# Patient Record
Sex: Male | Born: 1935 | Race: White | Hispanic: No | Marital: Married | Smoking: Former smoker
Health system: Southern US, Community
[De-identification: ages and names within clinical notes are randomized; demographics above are authoritative.]

## PROBLEM LIST (undated history)

## (undated) ENCOUNTER — Emergency Department (HOSPITAL_COMMUNITY): Disposition: A | Discharge status: Home / Self Care | Payer: Medicare Other

## (undated) DIAGNOSIS — G459 Transient cerebral ischemic attack, unspecified: Secondary | ICD-10-CM

## (undated) DIAGNOSIS — T4145XA Adverse effect of unspecified anesthetic, initial encounter: Secondary | ICD-10-CM

## (undated) DIAGNOSIS — I6529 Occlusion and stenosis of unspecified carotid artery: Secondary | ICD-10-CM

## (undated) DIAGNOSIS — E079 Disorder of thyroid, unspecified: Secondary | ICD-10-CM

## (undated) DIAGNOSIS — F419 Anxiety disorder, unspecified: Secondary | ICD-10-CM

## (undated) DIAGNOSIS — M1990 Unspecified osteoarthritis, unspecified site: Secondary | ICD-10-CM

## (undated) DIAGNOSIS — I209 Angina pectoris, unspecified: Secondary | ICD-10-CM

## (undated) DIAGNOSIS — Z8719 Personal history of other diseases of the digestive system: Secondary | ICD-10-CM

## (undated) DIAGNOSIS — J45909 Unspecified asthma, uncomplicated: Secondary | ICD-10-CM

## (undated) DIAGNOSIS — I10 Essential (primary) hypertension: Secondary | ICD-10-CM

## (undated) DIAGNOSIS — Z974 Presence of external hearing-aid: Secondary | ICD-10-CM

## (undated) DIAGNOSIS — R0602 Shortness of breath: Secondary | ICD-10-CM

## (undated) DIAGNOSIS — K219 Gastro-esophageal reflux disease without esophagitis: Secondary | ICD-10-CM

## (undated) DIAGNOSIS — N200 Calculus of kidney: Secondary | ICD-10-CM

## (undated) DIAGNOSIS — T8859XA Other complications of anesthesia, initial encounter: Secondary | ICD-10-CM

## (undated) DIAGNOSIS — E785 Hyperlipidemia, unspecified: Secondary | ICD-10-CM

## (undated) DIAGNOSIS — I2510 Atherosclerotic heart disease of native coronary artery without angina pectoris: Secondary | ICD-10-CM

## (undated) DIAGNOSIS — Z8489 Family history of other specified conditions: Secondary | ICD-10-CM

## (undated) DIAGNOSIS — K5792 Diverticulitis of intestine, part unspecified, without perforation or abscess without bleeding: Secondary | ICD-10-CM

## (undated) DIAGNOSIS — C61 Malignant neoplasm of prostate: Secondary | ICD-10-CM

## (undated) HISTORY — DX: Malignant neoplasm of prostate: C61

## (undated) HISTORY — DX: Diverticulitis of intestine, part unspecified, without perforation or abscess without bleeding: K57.92

## (undated) HISTORY — DX: Calculus of kidney: N20.0

## (undated) HISTORY — PX: CAROTID ENDARTERECTOMY: SUR193

## (undated) HISTORY — DX: Presence of external hearing-aid: Z97.4

## (undated) HISTORY — DX: Occlusion and stenosis of unspecified carotid artery: I65.29

## (undated) HISTORY — PX: CHOLECYSTECTOMY: SHX55

---

## 1996-07-13 HISTORY — PX: PROSTATECTOMY: SHX69

## 1999-04-29 ENCOUNTER — Ambulatory Visit: Admission: RE | Admit: 1999-04-29 | Discharge: 1999-04-29 | Payer: Self-pay | Admitting: Pulmonary Disease

## 1999-09-08 ENCOUNTER — Inpatient Hospital Stay (HOSPITAL_COMMUNITY): Admission: EM | Admit: 1999-09-08 | Discharge: 1999-09-09 | Payer: Self-pay | Admitting: Emergency Medicine

## 1999-09-08 ENCOUNTER — Encounter: Payer: Self-pay | Admitting: Internal Medicine

## 2002-08-23 ENCOUNTER — Encounter: Admission: RE | Admit: 2002-08-23 | Discharge: 2002-08-23 | Payer: Self-pay | Admitting: Sports Medicine

## 2002-09-12 ENCOUNTER — Encounter: Admission: RE | Admit: 2002-09-12 | Discharge: 2002-09-12 | Payer: Self-pay | Admitting: Sports Medicine

## 2002-09-20 ENCOUNTER — Encounter: Admission: RE | Admit: 2002-09-20 | Discharge: 2002-09-20 | Payer: Self-pay | Admitting: Family Medicine

## 2002-10-11 ENCOUNTER — Ambulatory Visit (HOSPITAL_COMMUNITY): Admission: RE | Admit: 2002-10-11 | Discharge: 2002-10-11 | Payer: Self-pay | Admitting: Family Medicine

## 2002-10-11 ENCOUNTER — Encounter: Admission: RE | Admit: 2002-10-11 | Discharge: 2002-10-11 | Payer: Self-pay | Admitting: Sports Medicine

## 2002-11-01 ENCOUNTER — Encounter: Admission: RE | Admit: 2002-11-01 | Discharge: 2002-11-01 | Payer: Self-pay | Admitting: Sports Medicine

## 2002-11-03 ENCOUNTER — Encounter: Admission: RE | Admit: 2002-11-03 | Discharge: 2002-11-03 | Payer: Self-pay | Admitting: Sports Medicine

## 2002-11-09 ENCOUNTER — Encounter: Admission: RE | Admit: 2002-11-09 | Discharge: 2002-11-09 | Payer: Self-pay | Admitting: Sports Medicine

## 2002-12-06 ENCOUNTER — Encounter (INDEPENDENT_AMBULATORY_CARE_PROVIDER_SITE_OTHER): Payer: Self-pay | Admitting: Specialist

## 2002-12-06 ENCOUNTER — Ambulatory Visit (HOSPITAL_COMMUNITY): Admission: RE | Admit: 2002-12-06 | Discharge: 2002-12-06 | Payer: Self-pay | Admitting: Gastroenterology

## 2002-12-12 ENCOUNTER — Encounter: Admission: RE | Admit: 2002-12-12 | Discharge: 2002-12-12 | Payer: Self-pay | Admitting: Sports Medicine

## 2003-03-14 ENCOUNTER — Encounter: Admission: RE | Admit: 2003-03-14 | Discharge: 2003-03-14 | Payer: Self-pay | Admitting: Family Medicine

## 2003-03-20 ENCOUNTER — Encounter: Admission: RE | Admit: 2003-03-20 | Discharge: 2003-03-20 | Payer: Self-pay | Admitting: Sports Medicine

## 2003-04-29 ENCOUNTER — Encounter: Payer: Self-pay | Admitting: Family Medicine

## 2003-04-29 ENCOUNTER — Ambulatory Visit (HOSPITAL_COMMUNITY): Admission: RE | Admit: 2003-04-29 | Discharge: 2003-04-29 | Payer: Self-pay | Admitting: Family Medicine

## 2003-05-01 ENCOUNTER — Encounter: Admission: RE | Admit: 2003-05-01 | Discharge: 2003-05-01 | Payer: Self-pay | Admitting: Sports Medicine

## 2003-06-13 ENCOUNTER — Encounter: Admission: RE | Admit: 2003-06-13 | Discharge: 2003-06-13 | Payer: Self-pay | Admitting: Sports Medicine

## 2003-06-29 ENCOUNTER — Encounter: Admission: RE | Admit: 2003-06-29 | Discharge: 2003-06-29 | Payer: Self-pay | Admitting: Family Medicine

## 2003-07-17 ENCOUNTER — Encounter: Admission: RE | Admit: 2003-07-17 | Discharge: 2003-07-17 | Payer: Self-pay | Admitting: Family Medicine

## 2003-07-19 ENCOUNTER — Encounter: Admission: RE | Admit: 2003-07-19 | Discharge: 2003-07-19 | Payer: Self-pay | Admitting: Family Medicine

## 2003-08-02 ENCOUNTER — Encounter: Admission: RE | Admit: 2003-08-02 | Discharge: 2003-08-02 | Payer: Self-pay | Admitting: Family Medicine

## 2003-08-16 ENCOUNTER — Encounter: Admission: RE | Admit: 2003-08-16 | Discharge: 2003-08-16 | Payer: Self-pay | Admitting: Sports Medicine

## 2003-08-30 ENCOUNTER — Encounter: Admission: RE | Admit: 2003-08-30 | Discharge: 2003-08-30 | Payer: Self-pay | Admitting: Sports Medicine

## 2003-09-20 ENCOUNTER — Encounter: Admission: RE | Admit: 2003-09-20 | Discharge: 2003-09-20 | Payer: Self-pay | Admitting: Family Medicine

## 2003-10-11 ENCOUNTER — Encounter: Admission: RE | Admit: 2003-10-11 | Discharge: 2003-10-11 | Payer: Self-pay | Admitting: Sports Medicine

## 2003-10-16 ENCOUNTER — Encounter: Admission: RE | Admit: 2003-10-16 | Discharge: 2003-10-16 | Payer: Self-pay | Admitting: Family Medicine

## 2003-11-01 ENCOUNTER — Encounter: Admission: RE | Admit: 2003-11-01 | Discharge: 2003-11-01 | Payer: Self-pay | Admitting: Family Medicine

## 2003-11-22 ENCOUNTER — Encounter: Admission: RE | Admit: 2003-11-22 | Discharge: 2003-11-22 | Payer: Self-pay | Admitting: Sports Medicine

## 2004-01-02 ENCOUNTER — Encounter: Admission: RE | Admit: 2004-01-02 | Discharge: 2004-01-02 | Payer: Self-pay | Admitting: Family Medicine

## 2004-01-09 ENCOUNTER — Encounter: Admission: RE | Admit: 2004-01-09 | Discharge: 2004-01-09 | Payer: Self-pay | Admitting: Family Medicine

## 2004-01-30 ENCOUNTER — Encounter: Admission: RE | Admit: 2004-01-30 | Discharge: 2004-01-30 | Payer: Self-pay | Admitting: Family Medicine

## 2004-02-05 ENCOUNTER — Encounter: Admission: RE | Admit: 2004-02-05 | Discharge: 2004-02-05 | Payer: Self-pay | Admitting: Sports Medicine

## 2004-02-13 ENCOUNTER — Encounter: Admission: RE | Admit: 2004-02-13 | Discharge: 2004-02-13 | Payer: Self-pay | Admitting: Sports Medicine

## 2004-03-05 ENCOUNTER — Encounter: Admission: RE | Admit: 2004-03-05 | Discharge: 2004-03-05 | Payer: Self-pay | Admitting: Family Medicine

## 2004-04-02 ENCOUNTER — Ambulatory Visit: Payer: Self-pay | Admitting: Family Medicine

## 2004-04-30 ENCOUNTER — Ambulatory Visit: Payer: Self-pay | Admitting: Sports Medicine

## 2004-05-06 ENCOUNTER — Ambulatory Visit: Payer: Self-pay | Admitting: Family Medicine

## 2004-05-13 ENCOUNTER — Ambulatory Visit: Payer: Self-pay | Admitting: Family Medicine

## 2004-06-12 ENCOUNTER — Ambulatory Visit: Payer: Self-pay | Admitting: Family Medicine

## 2004-06-24 ENCOUNTER — Ambulatory Visit: Payer: Self-pay | Admitting: Family Medicine

## 2004-07-01 ENCOUNTER — Ambulatory Visit: Payer: Self-pay | Admitting: Sports Medicine

## 2004-07-21 ENCOUNTER — Ambulatory Visit: Payer: Self-pay | Admitting: Family Medicine

## 2004-09-02 ENCOUNTER — Ambulatory Visit (HOSPITAL_COMMUNITY): Admission: RE | Admit: 2004-09-02 | Discharge: 2004-09-02 | Payer: Self-pay | Admitting: Gastroenterology

## 2005-04-14 ENCOUNTER — Ambulatory Visit: Payer: Self-pay | Admitting: Sports Medicine

## 2005-06-16 ENCOUNTER — Ambulatory Visit: Payer: Self-pay | Admitting: Sports Medicine

## 2005-06-18 ENCOUNTER — Encounter: Admission: RE | Admit: 2005-06-18 | Discharge: 2005-06-18 | Payer: Self-pay | Admitting: Sports Medicine

## 2005-09-14 ENCOUNTER — Ambulatory Visit: Payer: Self-pay | Admitting: Sports Medicine

## 2005-09-19 ENCOUNTER — Encounter: Admission: RE | Admit: 2005-09-19 | Discharge: 2005-09-19 | Payer: Self-pay | Admitting: Gastroenterology

## 2005-10-27 ENCOUNTER — Ambulatory Visit: Payer: Self-pay | Admitting: Sports Medicine

## 2005-10-30 ENCOUNTER — Ambulatory Visit: Payer: Self-pay | Admitting: Family Medicine

## 2005-11-03 ENCOUNTER — Encounter: Admission: RE | Admit: 2005-11-03 | Discharge: 2005-11-03 | Payer: Self-pay | Admitting: Sports Medicine

## 2006-01-14 ENCOUNTER — Ambulatory Visit: Payer: Self-pay | Admitting: Sports Medicine

## 2006-01-19 ENCOUNTER — Ambulatory Visit (HOSPITAL_COMMUNITY): Admission: RE | Admit: 2006-01-19 | Discharge: 2006-01-20 | Payer: Self-pay | Admitting: General Surgery

## 2006-01-19 ENCOUNTER — Encounter (INDEPENDENT_AMBULATORY_CARE_PROVIDER_SITE_OTHER): Payer: Self-pay | Admitting: General Surgery

## 2006-02-06 ENCOUNTER — Emergency Department (HOSPITAL_COMMUNITY): Admission: EM | Admit: 2006-02-06 | Discharge: 2006-02-06 | Payer: Self-pay | Admitting: Emergency Medicine

## 2006-04-05 ENCOUNTER — Ambulatory Visit: Payer: Self-pay | Admitting: Family Medicine

## 2006-04-13 ENCOUNTER — Ambulatory Visit (HOSPITAL_COMMUNITY): Admission: RE | Admit: 2006-04-13 | Discharge: 2006-04-13 | Payer: Self-pay | Admitting: Sports Medicine

## 2006-04-13 ENCOUNTER — Ambulatory Visit: Payer: Self-pay | Admitting: Sports Medicine

## 2006-04-30 ENCOUNTER — Ambulatory Visit: Payer: Self-pay | Admitting: Family Medicine

## 2006-05-11 ENCOUNTER — Ambulatory Visit: Payer: Self-pay | Admitting: Sports Medicine

## 2006-07-13 HISTORY — PX: CARDIAC CATHETERIZATION: SHX172

## 2006-08-13 ENCOUNTER — Ambulatory Visit: Payer: Self-pay | Admitting: Sports Medicine

## 2006-08-13 ENCOUNTER — Encounter: Payer: Self-pay | Admitting: Family Medicine

## 2006-08-13 LAB — CONVERTED CEMR LAB
AST: 17 units/L (ref 0–37)
Calcium: 9.2 mg/dL (ref 8.4–10.5)
Creatinine, Ser: 1.03 mg/dL (ref 0.40–1.50)
Glucose, Bld: 101 mg/dL — ABNORMAL HIGH (ref 70–99)
Lipase: 10 units/L (ref 0–75)
Potassium: 4.1 meq/L (ref 3.5–5.3)
Sodium: 141 meq/L (ref 135–145)
Total Bilirubin: 1.7 mg/dL — ABNORMAL HIGH (ref 0.3–1.2)
Triglycerides: 126 mg/dL (ref <150)

## 2006-08-18 ENCOUNTER — Encounter: Admission: RE | Admit: 2006-08-18 | Discharge: 2006-08-18 | Payer: Self-pay | Admitting: Family Medicine

## 2006-09-09 DIAGNOSIS — K219 Gastro-esophageal reflux disease without esophagitis: Secondary | ICD-10-CM | POA: Insufficient documentation

## 2006-09-09 DIAGNOSIS — K449 Diaphragmatic hernia without obstruction or gangrene: Secondary | ICD-10-CM | POA: Insufficient documentation

## 2006-09-09 DIAGNOSIS — I872 Venous insufficiency (chronic) (peripheral): Secondary | ICD-10-CM | POA: Insufficient documentation

## 2006-09-09 DIAGNOSIS — C61 Malignant neoplasm of prostate: Secondary | ICD-10-CM | POA: Insufficient documentation

## 2006-09-09 DIAGNOSIS — G4700 Insomnia, unspecified: Secondary | ICD-10-CM | POA: Insufficient documentation

## 2006-10-13 ENCOUNTER — Telehealth: Payer: Self-pay | Admitting: *Deleted

## 2007-01-10 ENCOUNTER — Ambulatory Visit: Payer: Self-pay | Admitting: Family Medicine

## 2007-01-11 ENCOUNTER — Encounter: Payer: Self-pay | Admitting: Family Medicine

## 2007-01-11 LAB — CONVERTED CEMR LAB
AST: 15 units/L (ref 0–37)
Albumin: 4.4 g/dL (ref 3.5–5.2)
BUN: 12 mg/dL (ref 6–23)
Glucose, Bld: 88 mg/dL (ref 70–99)
HDL: 41 mg/dL (ref >39)
LDL Cholesterol: 64 mg/dL (ref 0–99)
Potassium: 4.3 meq/L (ref 3.5–5.3)
Sodium: 141 meq/L (ref 135–145)
Total CHOL/HDL Ratio: 3.2

## 2007-01-12 ENCOUNTER — Inpatient Hospital Stay (HOSPITAL_COMMUNITY): Admission: AD | Admit: 2007-01-12 | Discharge: 2007-01-13 | Payer: Self-pay | Admitting: Cardiovascular Disease

## 2007-01-12 ENCOUNTER — Telehealth: Payer: Self-pay | Admitting: Family Medicine

## 2007-01-12 ENCOUNTER — Encounter: Payer: Self-pay | Admitting: Emergency Medicine

## 2007-01-24 ENCOUNTER — Telehealth (INDEPENDENT_AMBULATORY_CARE_PROVIDER_SITE_OTHER): Payer: Self-pay | Admitting: Family Medicine

## 2007-05-02 ENCOUNTER — Telehealth: Payer: Self-pay | Admitting: *Deleted

## 2007-05-03 ENCOUNTER — Telehealth (INDEPENDENT_AMBULATORY_CARE_PROVIDER_SITE_OTHER): Payer: Self-pay | Admitting: Family Medicine

## 2007-05-18 ENCOUNTER — Ambulatory Visit: Payer: Self-pay | Admitting: Family Medicine

## 2007-06-27 ENCOUNTER — Ambulatory Visit: Payer: Self-pay | Admitting: Sports Medicine

## 2007-08-05 ENCOUNTER — Encounter: Payer: Self-pay | Admitting: Family Medicine

## 2007-10-03 ENCOUNTER — Encounter: Payer: Self-pay | Admitting: Family Medicine

## 2007-10-14 ENCOUNTER — Emergency Department (HOSPITAL_COMMUNITY): Admission: EM | Admit: 2007-10-14 | Discharge: 2007-10-14 | Payer: Self-pay | Admitting: Emergency Medicine

## 2007-10-14 ENCOUNTER — Encounter: Payer: Self-pay | Admitting: Family Medicine

## 2007-10-19 ENCOUNTER — Ambulatory Visit: Payer: Self-pay | Admitting: Family Medicine

## 2007-10-19 ENCOUNTER — Telehealth: Payer: Self-pay | Admitting: Family Medicine

## 2007-12-07 ENCOUNTER — Telehealth: Payer: Self-pay | Admitting: *Deleted

## 2007-12-07 ENCOUNTER — Ambulatory Visit: Payer: Self-pay | Admitting: Family Medicine

## 2007-12-19 ENCOUNTER — Ambulatory Visit: Payer: Self-pay | Admitting: Family Medicine

## 2007-12-19 ENCOUNTER — Telehealth: Payer: Self-pay | Admitting: *Deleted

## 2008-01-03 ENCOUNTER — Telehealth (INDEPENDENT_AMBULATORY_CARE_PROVIDER_SITE_OTHER): Payer: Self-pay | Admitting: *Deleted

## 2008-01-19 ENCOUNTER — Encounter: Payer: Self-pay | Admitting: Family Medicine

## 2008-03-06 ENCOUNTER — Encounter: Payer: Self-pay | Admitting: Family Medicine

## 2008-07-10 ENCOUNTER — Encounter: Payer: Self-pay | Admitting: Family Medicine

## 2008-08-09 ENCOUNTER — Encounter: Admission: RE | Admit: 2008-08-09 | Discharge: 2008-08-09 | Payer: Self-pay | Admitting: Gastroenterology

## 2008-09-19 ENCOUNTER — Telehealth: Payer: Self-pay | Admitting: Family Medicine

## 2008-09-25 ENCOUNTER — Ambulatory Visit: Payer: Self-pay | Admitting: Family Medicine

## 2008-09-25 ENCOUNTER — Ambulatory Visit (HOSPITAL_COMMUNITY): Admission: RE | Admit: 2008-09-25 | Discharge: 2008-09-25 | Payer: Self-pay | Admitting: Family Medicine

## 2008-09-25 LAB — CONVERTED CEMR LAB
ALT: 13 units/L (ref 0–53)
AST: 18 units/L (ref 0–37)
BUN: 13 mg/dL (ref 6–23)
Chloride: 104 meq/L (ref 96–112)
Creatinine, Ser: 0.9 mg/dL (ref 0.40–1.50)
Platelets: 165 10*3/uL (ref 150–400)
RBC: 4.43 M/uL (ref 4.22–5.81)
TSH: 4.347 microintl units/mL (ref 0.350–4.500)
Total Bilirubin: 1.9 mg/dL — ABNORMAL HIGH (ref 0.3–1.2)
Total Protein: 7.2 g/dL (ref 6.0–8.3)
Triglycerides: 96 mg/dL (ref <150)
VLDL: 19 mg/dL (ref 0–40)
WBC: 6.2 10*3/uL (ref 4.0–10.5)

## 2008-09-28 ENCOUNTER — Encounter: Payer: Self-pay | Admitting: Family Medicine

## 2008-10-03 ENCOUNTER — Encounter: Payer: Self-pay | Admitting: Family Medicine

## 2008-10-04 ENCOUNTER — Telehealth (INDEPENDENT_AMBULATORY_CARE_PROVIDER_SITE_OTHER): Payer: Self-pay | Admitting: *Deleted

## 2009-04-16 ENCOUNTER — Encounter: Payer: Self-pay | Admitting: Family Medicine

## 2009-04-18 ENCOUNTER — Encounter: Payer: Self-pay | Admitting: Family Medicine

## 2009-05-16 ENCOUNTER — Emergency Department (HOSPITAL_COMMUNITY): Admission: EM | Admit: 2009-05-16 | Discharge: 2009-05-17 | Payer: Self-pay | Admitting: Emergency Medicine

## 2009-05-16 ENCOUNTER — Ambulatory Visit (HOSPITAL_COMMUNITY): Admission: RE | Admit: 2009-05-16 | Discharge: 2009-05-16 | Payer: Self-pay | Admitting: Urology

## 2009-05-20 ENCOUNTER — Emergency Department (HOSPITAL_BASED_OUTPATIENT_CLINIC_OR_DEPARTMENT_OTHER): Admission: EM | Admit: 2009-05-20 | Discharge: 2009-05-20 | Payer: Self-pay | Admitting: Emergency Medicine

## 2009-05-20 ENCOUNTER — Ambulatory Visit: Payer: Self-pay | Admitting: Diagnostic Radiology

## 2009-05-22 ENCOUNTER — Ambulatory Visit (HOSPITAL_BASED_OUTPATIENT_CLINIC_OR_DEPARTMENT_OTHER): Admission: RE | Admit: 2009-05-22 | Discharge: 2009-05-22 | Payer: Self-pay | Admitting: Urology

## 2009-06-07 ENCOUNTER — Ambulatory Visit (HOSPITAL_COMMUNITY): Admission: RE | Admit: 2009-06-07 | Discharge: 2009-06-07 | Payer: Self-pay | Admitting: Urology

## 2009-07-19 ENCOUNTER — Encounter: Payer: Self-pay | Admitting: Family Medicine

## 2009-07-19 ENCOUNTER — Encounter: Admission: RE | Admit: 2009-07-19 | Discharge: 2009-07-19 | Payer: Self-pay | Admitting: Family Medicine

## 2009-07-19 ENCOUNTER — Ambulatory Visit: Payer: Self-pay | Admitting: Family Medicine

## 2009-07-19 DIAGNOSIS — M545 Low back pain, unspecified: Secondary | ICD-10-CM | POA: Insufficient documentation

## 2009-07-19 LAB — CONVERTED CEMR LAB
AST: 16 units/L (ref 0–37)
Albumin: 4.6 g/dL (ref 3.5–5.2)
Alkaline Phosphatase: 73 units/L (ref 39–117)
BUN: 14 mg/dL (ref 6–23)
MCV: 95.6 fL (ref 78.0–100.0)
Potassium: 4.2 meq/L (ref 3.5–5.3)
RBC: 4.52 M/uL (ref 4.22–5.81)
RDW: 14.1 % (ref 11.5–15.5)
Total Bilirubin: 2 mg/dL — ABNORMAL HIGH (ref 0.3–1.2)
Total Protein: 7.2 g/dL (ref 6.0–8.3)
WBC: 6.3 10*3/uL (ref 4.0–10.5)

## 2009-07-22 ENCOUNTER — Encounter: Payer: Self-pay | Admitting: Family Medicine

## 2009-08-14 ENCOUNTER — Ambulatory Visit: Payer: Self-pay | Admitting: Family Medicine

## 2009-08-14 DIAGNOSIS — R413 Other amnesia: Secondary | ICD-10-CM | POA: Insufficient documentation

## 2009-08-14 LAB — CONVERTED CEMR LAB: Vitamin B-12: 1109 pg/mL — ABNORMAL HIGH (ref 211–911)

## 2009-08-16 ENCOUNTER — Encounter: Payer: Self-pay | Admitting: Family Medicine

## 2009-08-16 ENCOUNTER — Telehealth: Payer: Self-pay | Admitting: Family Medicine

## 2009-09-27 ENCOUNTER — Encounter: Payer: Self-pay | Admitting: Family Medicine

## 2009-09-27 ENCOUNTER — Ambulatory Visit: Payer: Self-pay | Admitting: Family Medicine

## 2009-09-27 LAB — CONVERTED CEMR LAB
AST: 17 units/L (ref 0–37)
Albumin: 4.4 g/dL (ref 3.5–5.2)
Alkaline Phosphatase: 76 units/L (ref 39–117)
BUN: 16 mg/dL (ref 6–23)
HDL: 55 mg/dL (ref >39)
LDL Cholesterol: 60 mg/dL (ref 0–99)
Potassium: 4.1 meq/L (ref 3.5–5.3)
Sodium: 142 meq/L (ref 135–145)
Total Bilirubin: 1.7 mg/dL — ABNORMAL HIGH (ref 0.3–1.2)
Total Protein: 6.9 g/dL (ref 6.0–8.3)
VLDL: 16 mg/dL (ref 0–40)

## 2009-10-01 ENCOUNTER — Encounter: Payer: Self-pay | Admitting: Family Medicine

## 2009-10-02 ENCOUNTER — Ambulatory Visit: Payer: Self-pay | Admitting: Family Medicine

## 2009-10-17 ENCOUNTER — Emergency Department (HOSPITAL_BASED_OUTPATIENT_CLINIC_OR_DEPARTMENT_OTHER): Admission: EM | Admit: 2009-10-17 | Discharge: 2009-10-17 | Payer: Self-pay | Admitting: Emergency Medicine

## 2009-10-17 ENCOUNTER — Ambulatory Visit: Payer: Self-pay | Admitting: Radiology

## 2009-11-27 ENCOUNTER — Encounter: Payer: Self-pay | Admitting: Family Medicine

## 2009-12-18 ENCOUNTER — Encounter: Admission: RE | Admit: 2009-12-18 | Discharge: 2009-12-18 | Payer: Self-pay | Admitting: Family Medicine

## 2009-12-18 ENCOUNTER — Ambulatory Visit: Payer: Self-pay | Admitting: Family Medicine

## 2009-12-18 ENCOUNTER — Telehealth: Payer: Self-pay | Admitting: Family Medicine

## 2009-12-18 DIAGNOSIS — H833X9 Noise effects on inner ear, unspecified ear: Secondary | ICD-10-CM | POA: Insufficient documentation

## 2009-12-20 ENCOUNTER — Telehealth: Payer: Self-pay | Admitting: *Deleted

## 2009-12-25 ENCOUNTER — Encounter: Payer: Self-pay | Admitting: Family Medicine

## 2009-12-25 ENCOUNTER — Ambulatory Visit: Payer: Self-pay | Admitting: Family Medicine

## 2009-12-25 DIAGNOSIS — L219 Seborrheic dermatitis, unspecified: Secondary | ICD-10-CM | POA: Insufficient documentation

## 2009-12-25 DIAGNOSIS — K5730 Diverticulosis of large intestine without perforation or abscess without bleeding: Secondary | ICD-10-CM | POA: Insufficient documentation

## 2009-12-25 LAB — CONVERTED CEMR LAB
CO2: 27 meq/L (ref 19–32)
Calcium: 9.9 mg/dL (ref 8.4–10.5)
Creatinine, Ser: 1.27 mg/dL (ref 0.40–1.50)
Free T4: 1.08 ng/dL (ref 0.80–1.80)
Sodium: 138 meq/L (ref 135–145)
T3, Free: 2.6 pg/mL (ref 2.3–4.2)

## 2009-12-26 ENCOUNTER — Encounter: Payer: Self-pay | Admitting: Family Medicine

## 2009-12-27 ENCOUNTER — Telehealth: Payer: Self-pay | Admitting: *Deleted

## 2009-12-31 ENCOUNTER — Encounter: Payer: Self-pay | Admitting: Family Medicine

## 2009-12-31 ENCOUNTER — Ambulatory Visit (HOSPITAL_COMMUNITY): Admission: RE | Admit: 2009-12-31 | Discharge: 2009-12-31 | Payer: Self-pay | Admitting: Family Medicine

## 2010-01-03 ENCOUNTER — Encounter: Payer: Self-pay | Admitting: Family Medicine

## 2010-01-08 ENCOUNTER — Ambulatory Visit: Payer: Self-pay | Admitting: Family Medicine

## 2010-01-08 LAB — CONVERTED CEMR LAB
CO2: 28 meq/L (ref 19–32)
Calcium: 9.6 mg/dL (ref 8.4–10.5)
Creatinine, Ser: 1.39 mg/dL (ref 0.40–1.50)
Sodium: 139 meq/L (ref 135–145)

## 2010-01-09 ENCOUNTER — Encounter: Payer: Self-pay | Admitting: Family Medicine

## 2010-02-05 ENCOUNTER — Ambulatory Visit: Payer: Self-pay | Admitting: Family Medicine

## 2010-02-07 ENCOUNTER — Encounter: Payer: Self-pay | Admitting: Family Medicine

## 2010-02-07 ENCOUNTER — Ambulatory Visit: Payer: Self-pay | Admitting: Family Medicine

## 2010-03-06 ENCOUNTER — Encounter: Payer: Self-pay | Admitting: Family Medicine

## 2010-04-01 ENCOUNTER — Encounter: Payer: Self-pay | Admitting: *Deleted

## 2010-07-17 ENCOUNTER — Ambulatory Visit
Admission: RE | Admit: 2010-07-17 | Discharge: 2010-07-17 | Payer: Self-pay | Source: Home / Self Care | Attending: Family Medicine | Admitting: Family Medicine

## 2010-08-06 ENCOUNTER — Encounter: Payer: Self-pay | Admitting: Family Medicine

## 2010-08-06 ENCOUNTER — Ambulatory Visit
Admission: RE | Admit: 2010-08-06 | Discharge: 2010-08-06 | Payer: Self-pay | Source: Home / Self Care | Attending: Family Medicine | Admitting: Family Medicine

## 2010-08-06 ENCOUNTER — Encounter: Payer: Self-pay | Admitting: *Deleted

## 2010-08-06 LAB — CONVERTED CEMR LAB
ALT: 16 units/L (ref 0–53)
AST: 19 units/L (ref 0–37)
Albumin: 4.8 g/dL (ref 3.5–5.2)
CO2: 30 meq/L (ref 19–32)
Calcium: 9.6 mg/dL (ref 8.4–10.5)
Chloride: 103 meq/L (ref 96–112)
Creatinine, Ser: 1.03 mg/dL (ref 0.40–1.50)
Direct LDL: 93 mg/dL
Potassium: 4.1 meq/L (ref 3.5–5.3)
Sodium: 141 meq/L (ref 135–145)
Total Protein: 7.6 g/dL (ref 6.0–8.3)

## 2010-08-11 ENCOUNTER — Encounter: Payer: Self-pay | Admitting: Family Medicine

## 2010-08-12 NOTE — Assessment & Plan Note (Signed)
Summary: wellness visit,tcb   Patient was here today for AWV;  Complaints are new onset of cough since starting lisinopril Tiredness and lack of energy LE edema   He reports: Excellent family support and has discussed living will and some degree of advanced directives with his children Independent in all ADL and IADL Reports some memory problems, was able to remember 3 objects in 10 minutes; his wife has commented that she worries about his memory Feels little pleasure in life and has little energy to do things, he use to exercise but has stopped  He wears hearing aids Diet is low in insoluable fiber, but low fat and low sugar, uses potassium salt on everything Smoking history of :  36 pack years Prevention up to date except for Zostavax and [place] X1 for AAA (since he has a smoking history)  Today's Plan:  Zostavax script Schedule [place] of Abdomen, screening AAA Lab to check creatitine and KCL (cards started ACE and KCL replacement, patient uses a lot of K+salts Recommendation for Vitamin D replacement 1000 International Units daily Elevate legs mid day to promote venous return Exercise nearly every day Increase insoluable fiber in diet      Vital Signs:  Patient profile:   77 year old male BP sitting:   160 / 80  Habits & Providers  Alcohol-Tobacco-Diet     Alcohol drinks/day: 1     Tobacco Status: quit > 6 months     Pack years: 36     Diet Comments: healhy     Diet Counseling: not indicated; diet is assessed to be healthy  Exercise-Depression-Behavior     Does Patient Exercise: no     Exercise Counseling: to improve exercise regimen     Type of exercise: walk     Exercise (avg: min/session): 30-40 min daily     Times/week: 5     Have you felt down or hopeless? yes     Have you felt little pleasure in things? no     Depression Counseling: not indicated; screening negative for depression     STD Risk: never     Drug Use: never     Seat Belt Use: always     Sun  Exposure: infrequent  Comments: has smoke alarms in home, home is not equiped with hand rails or aids     Allergist: Lerry Paterson     Cardiologist: Mertie Moores     Dermatologist: Justice Britain     ENT: Elie Goody     Gastroenterologist: Marilynn Latino     Ophthalmologist: Barbaraann Boys     Urologist: Lawerance Bach  Comments: Pharmacy: [facility] Dentist:  Sheliah Plane, [place], [place]  Allergies: 1)  ! Sulfa  Orders Added: 1)  [facility] [id number] 2)  Free T3-[facility] [id number] 3)  Free T4-[facility] [id number] 4)  Ultrasound [Ultrasound] Prescriptions: ZOSTAVAX 91478 UNT/0.65ML SOLR (ZOSTER VACCINE LIVE) once  #1 x 0   Entered by:   Tereasa Coop NP   Authorized by:   Elray Mcgregor RN   Signed by:   Tereasa Coop NP on 12/25/2009   Method used:   Print then Give to Patient   RxID:   [id number]    Prevention & Chronic Care Immunizations   Influenza vaccine: given  (03/13/2008)   Influenza vaccine due: 03/13/2009    Tetanus booster: 09/25/2008: given   Tetanus booster due: 09/26/2018    Pneumococcal vaccine: given  (03/13/2005)   Pneumococcal vaccine due: None  H. zoster vaccine: Not documented  Colorectal Screening   Hemoccult: had colonoscopy not indicated  (07/10/2008)   Hemoccult due: Not Indicated    Colonoscopy: Done.  (09/11/2002)   Colonoscopy due: 09/10/2012  Other Screening   PSA: is followed at urology w psa s/p prostate cancer  (09/25/2008)   PSA due due: Not Indicated   Smoking status: quit > 6 months  (12/25/2009)  Lipids   Total Cholesterol: 131  (09/27/2009)   Lipid panel action/deferral: LDL Direct Ordered   LDL: 60  (09/27/2009)   LDL Direct: Not documented   HDL: 55  (09/27/2009)   Triglycerides: 81  (09/27/2009)    SGOT (AST): 17  (09/27/2009)   SGPT (ALT): 15  (09/27/2009)   Alkaline phosphatase: 76  (09/27/2009)   Total bilirubin: 1.7  (09/27/2009)    Lipid flowsheet reviewed?: Yes   Progress  toward LDL goal: At goal  Hypertension   Last Blood Pressure: 160 / 80  (12/25/2009)   Serum creatinine: 0.98  (09/27/2009)   Serum potassium 4.1  (09/27/2009)    Hypertension flowsheet reviewed?: Yes   Progress toward BP goal: Unchanged  Self-Management Support :    Hypertension self-management support: Not documented    Lipid self-management support: Not documented    Family History:    2 sisters sudden death-MI    Mother 85-heart disease, thyroid disease    Father 85-heart disease, stroke  Social History:    Does Patient Exercise:  no    Sun Exposure-Excessive:  infrequent    Therapist, art Use:  always    Smoking Status:  quit > 6 months    STD Risk:  never    Drug Use:  never   Past History:  Past Surgical History: Last updated: 08/14/2009 cardiac Cath--60% lad, 75% ostial-smith - 09/12/2002,  carotid endart.--r. 1998 - 09/12/2002,  Cholecystectomy--rosenbower - 04/12/2006,  colonoscopy--diverticulosis - 09/11/2002,  Endoscopy--sliding hiatal hernia-buccini - 04/12/2006,  radical prostatectomy--1998 - 09/12/2002 lithotripsy with complications, subsequent ureteral stens 2010.  Social History: Last updated: 10/14/2007 Lives with wife--recently diagnosed with breast ca; No smoking; Occassional wine; Retired; 2 daughters live in [place]--5 grandchildren(boys). On weight watchers. Lives in [place] with wife. Retired Solicitor. Tobacco history 2 ppd x 32 years. No smoking x 25 years. No drugs.

## 2010-08-12 NOTE — Letter (Signed)
Summary: [facility]  [address]   [place], [place] [address]   Phone: [phone]  Fax: [phone]    01/09/2010  [place], [place]  [address]  Dear Mr. FLORESCA,  I think you can STOP the potassium supplement. All of your labs looked normal! See you in about a month.         Sincerely,   Dorcas Mcmurray MD  Appended Document: LABLetter mailed.

## 2010-08-12 NOTE — Miscellaneous (Signed)
  Clinical Lists Changes  Problems: Added new problem of HEALTH SCREENING (ICD-V70.0) - Signed Orders: Added new Test order of Provider Misc Charge- [facility] - Signed

## 2010-08-12 NOTE — Consult Note (Signed)
Summary: [facility]- f/u  [facility]- f/u   Imported By: Audie Clear 03/25/2010 11:17:31  _____________________________________________________________________  External Attachment:    Type:   Image     Comment:   External Document

## 2010-08-12 NOTE — Progress Notes (Signed)
  Clinical Lists Changes  DEAR WHITE TEAM re his labs and f/u his wellness visit--I talked to Ms saxon and we have decided to do this:  Please call and tell him to STOP the lisinopril and STOP the HCTZ--START the new Hyzaar I just called in--make an appt to see me in 2 weeks--tell them OK to dbl bk. Thanks!  Dorcas Mcmurray MD  December 27, 2009 3:28 PM  Medications: Removed medication of HYDROCHLOROTHIAZIDE 25 MG TABS (HYDROCHLOROTHIAZIDE) 1 by mouth qd - Signed Removed medication of LISINOPRIL 10 MG TABS (LISINOPRIL) 1 by mouth qd - Signed Added new medication of HYZAAR 100-12.5 MG TABS (LOSARTAN POTASSIUM-HCTZ) 1 by mouth qd - Signed Rx of HYZAAR 100-12.5 MG TABS (LOSARTAN POTASSIUM-HCTZ) 1 by mouth qd;  #30 x 5;  Signed;  Entered by: Dorcas Mcmurray MD;  Authorized by: Dorcas Mcmurray MD;  Method used: Electronically to [facility] [phone]*, [address] [place], [place]  [address], Ph: [phone], Fax: [phone]  Pt called and stated that when he looked Hyzaar up on the computer it advised against taking it if the patient had ever had an allergic reaction to Sulfa drugs.  Pt does have a Sulfa allergy.  Spoke with Dr. Nori Riis who looked the medication up and did not seen any evidence of this warning.  Called pt back and told him to go ahead and take the med but if he begins to have any type of reaction to stop it immediately.  Pt agreeable.  Elray Mcgregor RN  December 27, 2009 4:38 PM      Prescriptions: Konrad Penta 100-12.5 MG TABS (LOSARTAN POTASSIUM-HCTZ) 1 by mouth qd  #30 x 5   Entered and Authorized by:   Dorcas Mcmurray MD   Signed by:   Dorcas Mcmurray MD on 12/27/2009   Method used:   Electronically to        [facility] [phone]* (retail)       [address]       [place], [place]  [address]       Ph: [phone]       Fax: [phone]   RxID:   [id number]

## 2010-08-12 NOTE — Progress Notes (Signed)
  Phone Note Call from Patient   Caller: Patient Call For: Zachary Mcmurray MD Summary of Call: Please put in an order for fasting lipids for pt for 3/18.  Pt will come in and have so it will be back by appt on 3/23  Initial call taken by: Eusebio Friendly  Follow-up for Phone Call        done

## 2010-08-12 NOTE — Letter (Signed)
Summary: LAB Letter  [facility]  [address]   [place], [place] [address]   Phone: [phone]  Fax: [phone]    10/01/2009  [name] [place], [place]  [address]  Dear [name], Your blood sugar, liver and kidney function, electrolytes were all normal. Your cholesterol looks great: total = 131, LDL = 60 and HDL 55. This is GREAT!  Cholesterol               131 mg/dL                   0-200     ATP III Classification:           < 200        mg/dL        Desirable          200 - 239     mg/dL        Borderline High          >= 240        mg/dL        High         Triglyceride              81 mg/dL                    <150   HDL Cholesterol           55 mg/dL                    >39   Total Chol/HDL Ratio      2.4 Ratio  VLDL Cholesterol (Calc)                             16 mg/dL                    0-40  LDL Cholesterol (Calc)                             60 mg/dL                    0-99        Sincerely,   Dorcas Mcmurray MD  Appended Document: LAB Letter mailed

## 2010-08-12 NOTE — Letter (Signed)
Summary: Generic Letter  [facility]  [address]   [place], [place] [address]   Phone: [phone]  Fax: [phone]    07/22/2009  [place], [place]  [address]  Dear Mr. MARSOLEK,  All of your blood work was normal.  The Xray of your lumbar spine showed mild changes due to arthritis.  I am still not sure why you are feeling these sensations.  Please follow up with Dr. Nori Riis as discussed.         Sincerely,   Tereasa Coop NP  Appended Document: Generic Letter mailed.

## 2010-08-12 NOTE — Progress Notes (Signed)
Summary: meds  Phone Note Call from Patient Call back at Home Phone [phone]   Caller: Patient Summary of Call: pt calling to let Dr Nori Riis know the meds he is on: HCTZ - 25mg  Potassium Chlor Con 10 meq Lisinopril 10 mg Initial call taken by: Audie Clear,  December 18, 2009 11:44 AM

## 2010-08-12 NOTE — Progress Notes (Signed)
  Phone Note Outgoing Call   Summary of Call: [place] please tell him his chest x ray was normal Thanks!  Dorcas Mcmurray MD  December 20, 2009 11:31 AM   Follow-up for Phone Call        pt notified Follow-up by: Audelia Hives CMA,  December 20, 2009 11:34 AM

## 2010-08-12 NOTE — Assessment & Plan Note (Signed)
Summary: follow up/meds per Shaia Porath/tlb   Vital Signs:  Patient profile:   77 year old male Height:      67 inches Weight:      182.7 pounds BMI:     28.72 Temp:     98.1 degrees F oral Pulse rate:   80 / minute BP sitting:   140 / 72  (left arm) Cuff size:   large  Vitals Entered By: Isabelle Course (January 08, 2010 9:20 AM)  CC: F/U HTN  meds, question about potassium Is Patient Diabetic? No Pain Assessment Patient in pain? no        Primary Care Provider:  Dorcas Mcmurray MD  CC:  F/U HTN  meds and question about potassium.  History of Present Illness: Follow up hypertension. Taking medicines regularly with no problems now, had some increased sensitivity to sunlight in first few days of hyzaar (or he was just OUT in sun more, he is not sure) but started wearing sunscreen and Ok no. . Not having any any headaches or chest pains. No edema  has continued his potassium supplement and uses potassium salt substitute--except did not take his potassium yesterday or today and has not yet taken BP today either.  2) Memory issues: no significant worsening in last few moths but he recants for me the story he told Ms Zebedee Iba at his Wellness visit--he was driving and suddenly realized he did not know where he was going, was not real sure where he was but was able to return home. Single incident.  Also recalls he set the computer timer to tape a special show and then promptly shut down the whole computer --so the show never was taped. These incident frustrate him.  Tells me that a few years ago he had a "brain scan" of some type that shoed his brain had "more atrophy" that was usual for his age.  3) giot some hearing aids and they are making a HUGE difference for him. Well pleased  Habits & Providers  Alcohol-Tobacco-Diet     Tobacco Status: quit     Year Quit: 1985  Current Medications (verified): 1)  Bayer Aspirin 325 Mg Tabs (Aspirin) .... Take 1 Tablet By Mouth Once A Day 2)  Klonopin 0.5  Mg Tabs (Clonazepam) .... Take 1 Tablet By Mouth At Bedtime 3)  Lipitor 10 Mg Tabs (Atorvastatin Calcium) .... Take 1 Tablet By Mouth Once A Day 4)  Norvasc 10 Mg Tabs (Amlodipine Besylate) .Marland Kitchen.. 1 By Mouth Qd 5)  Omeprazole 20 Mg Cpdr (Omeprazole) .Marland Kitchen.. 1 By Mouth Qd 6)  Base B Polyethylene Glycol   Powd (Polyethylene Glycol 3350) .Marland KitchenMarland KitchenMarland Kitchen 17 G of Powder Mixed With Water Every Other Day Disp Qs For 3 Months 7)  Nitrolingual 0.4 Mg/spray Tl Soln (Nitroglycerin) .... One Spray As Needed Exertional Chest Pain. 8)  Proventil Hfa 108 (90 Base) Mcg/act  Aers (Albuterol Sulfate) .... 2 P Qid As Needed 9)  Vicodin 5-500 Mg Tabs (Hydrocodone-Acetaminophen) .Marland Kitchen.. 1 By Mouth Two Times A Day As Needed Pain 10)  Triamcinolone Acetonide 0.1 % Lotn (Triamcinolone Acetonide) 11)  Zostavax 19400 Unt/0.102ml Solr (Zoster Vaccine Live) .... Once 12)  Hyzaar 100-12.5 Mg Tabs (Losartan Potassium-Hctz) .Marland Kitchen.. 1 By Mouth Qd 13)  Aricept 5 Mg Tabs (Donepezil Hcl) .Marland Kitchen.. 1 By Mouth Qd  Allergies: 1)  ! Sulfa  Past History:  Past Surgical History: Last updated: 08/14/2009 cardiac Cath--60% lad, 75% ostial-smith - 09/12/2002,  carotid endart.--r. 1998 - 09/12/2002,  Cholecystectomy--rosenbower - 04/12/2006,  colonoscopy--diverticulosis - 09/11/2002,  Endoscopy--sliding hiatal hernia-buccini - 04/12/2006,  radical prostatectomy--1998 - 09/12/2002 lithotripsy with complications, subsequent ureteral stens 2010.  Past Medical History: diverticulitis - recurrent,  dry eyes, mouth--seen by dr. Truman Hayward (ent),  tinnitus--chronic cardiologist Dr [name] - Cath 2008 showed EF 65% Min coronary artery irregularities Urology Dr Tresa Endo GI Dr Cristina Gong - Diverticulosis/Diverticulitis Dr Clent Jacks did his cea - carotid endarterectomy hi cholesterol AAA screen negative 12/2009  Social History: Smoking Status:  quit  Review of Systems       Please see HPI for additional ROS.  Neuro:  Complains of difficulty with concentration and memory  loss; denies disturbances in coordination. Psych:  Denies anxiety, depression, easily angered, easily tearful, irritability, sense of great danger, thoughts of violence, and unusual visions or sounds.  Physical Exam  General:  alert, well-developed, well-nourished, and well-hydrated.   Eyes:  EOMI Ears:  B hearing aids and I can tell he is hearing our conversation better today, although still some difficulty at times. Neck:  well healed left CEA scarsupple, full ROM, and no masses.   Extremities:  no edema Neurologic:  alert & oriented X3, strength normal in all extremities, and gait normal.   Psych:  Oriented X3, normally interactive, good eye contact, not anxious appearing, not depressed appearing, not agitated, not suicidal, and not homicidal.     Impression & Recommendations:  Problem # 1:  HYPERTENSION, BENIGN SYSTEMIC (ICD-401.1) Orders: Basic Met-[facility] [id number]) [place]- Est  Level 4 ([address])checked potassium and will stop K+ suplplement. BP looks Ok --esp considering he has not yet taken todays meds. he is using a med pill box---sometimes realizes he has missed a day or two of entire meds (see #2)  Problem # 2:  MEMORY LOSS (ICD-780.93)  After discussing W NP Saxon, i agree there is some eraly memory issues here. Iplanned to give him the [name] today but we ran out of time. Long discussion--he seems to be suffereing significant impairment in memory as well as concentration. We decided to start Aricept--rtc 1 m-If no problems consider full dose aricept--consider adding other med for focus and concentration--he has had some depressive signs inpast so in list of possibles would be SSRI as well as methylphenidate. He is agreeable. Will rty to administer MOCA at next visit. 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 Take 1 tablet by mouth once a day 2)  Klonopin 0.5 Mg Tabs (Clonazepam) .... Take 1 tablet by mouth at bedtime 3)  Lipitor 10 Mg Tabs (Atorvastatin calcium) .... Take 1 tablet by mouth once a day 4)  Norvasc 10 Mg Tabs (Amlodipine besylate) .Marland Kitchen.. 1 by mouth qd 5)  Omeprazole 20 Mg Cpdr (Omeprazole) .Marland Kitchen.. 1 by mouth qd 6)  Base B Polyethylene Glycol Powd (Polyethylene glycol 3350) .Marland KitchenMarland KitchenMarland Kitchen 17 g of powder mixed with water every other day disp qs for 3 months 7)  Nitrolingual 0.4 Mg/spray Tl Soln (Nitroglycerin) .... One spray as needed exertional chest pain. 8)  Proventil Hfa 108 (90 Base) Mcg/act Aers (Albuterol sulfate) .... 2 p qid as needed 9)  Vicodin 5-500 Mg Tabs (Hydrocodone-acetaminophen) .Marland Kitchen.. 1 by mouth two times a day as needed pain 10)  Triamcinolone Acetonide  0.1 % Lotn (Triamcinolone acetonide) 11)  Zostavax 19400 Unt/0.40ml Solr (Zoster vaccine live) .... Once 12)  Hyzaar 100-12.5 Mg Tabs (Losartan potassium-hctz) .Marland Kitchen.. 1 by mouth qd 13)  Aricept 5 Mg Tabs (Donepezil hcl) .Marland Kitchen.. 1 by mouth qd Basic Met-[facility] [id number]) [place]- Est  Level 4 [id number])  Patient Instructions: 1)  Wabout that. 2)  We are starting ARICEPT, one pill a day--to protect from future memory loss. 3)  I would like to see you in about a month to follow up. The aricept is not going to make a huge difference in you current memory, but hopefully protect you from future loss.  4)  When I see you next month, we may talk about adding a second medicine to help you focus. 5)  Great to see you! Prescriptions: ARICEPT 5 MG TABS (DONEPEZIL HCL) 1 by mouth qd  #30 x 12   Entered and Authorized by:   Dorcas Mcmurray MD   Signed by:   Dorcas Mcmurray MD on 01/08/2010   Method used:   Electronically to        [facility] [phone]* (retail)       [address]       [place], [place]  [address]       Ph: [phone]        Fax: [phone]   [id number]     Impression & Recommendations:  His updated medication list for this problem includes:    Norvasc 10 Mg Tabs (Amlodipine besylate) .Marland Kitchen... 1 by mouth qd    Hyzaar 100-12.5 Mg Tabs (Losartan potassium-hctz) .Marland Kitchen... 1 by mouth qd  Orders: Basic Met-[facility] [id number])   Orders: [facility] [id number]) [place]- Est  Level 4 [id number])     Orders: [facility]- Est  Level 4 [id number])     Orders: [place]- Est  Level 4 [id number])    Complete Medication List: 1)  Bayer Aspirin 325 Mg Tabs (Aspirin) .... Take 1 tablet by mouth once a day 2)  Klonopin 0.5 Mg Tabs (Clonazepam) .... Take 1 tablet by mouth at bedtime 3)  Lipitor 10 Mg Tabs (Atorvastatin calcium) .... Take 1 tablet by mouth once a day 4)  Norvasc 10 Mg Tabs (Amlodipine besylate) .Marland Kitchen.. 1 by mouth qd 5)  Omeprazole 20 Mg Cpdr (Omeprazole) .Marland Kitchen.. 1 by mouth qd 6)  Base B Polyethylene Glycol Powd (Polyethylene glycol 3350) .Marland KitchenMarland KitchenMarland Kitchen 17 g of powder mixed with water every other day disp qs for 3 months 7)  Nitrolingual 0.4 Mg/spray Tl Soln (Nitroglycerin) .... One spray as needed exertional chest pain. 8)  Proventil Hfa 108 (90 Base) Mcg/act Aers (Albuterol sulfate) .... 2 p qid as needed 9)  Vicodin 5-500 Mg Tabs (Hydrocodone-acetaminophen) .Marland Kitchen.. 1 by mouth two times a day as needed pain 10)  Triamcinolone Acetonide 0.1 % Lotn (Triamcinolone acetonide) 11)  Zostavax 19400 Unt/0.58ml Solr (Zoster vaccine live) .... Once 12)  Hyzaar 100-12.5 Mg Tabs (Losartan potassium-hctz) .Marland Kitchen.. 1 by mouth qd 13)  Aricept 5 Mg Tabs (Donepezil hcl) .Marland Kitchen.. 1 by mouth qd   Prevention & Chronic Care Immunizations   Influenza vaccine: given  (03/13/2008)   Influenza vaccine due: 03/13/2009    Tetanus booster: 09/25/2008: given   Tetanus booster due: 09/26/2018    Pneumococcal vaccine: given  (03/13/2005)   Pneumococcal vaccine due: None    H. zoster vaccine: Not documented  Colorectal Screening   Hemoccult: had  colonoscopy not indicated  (07/10/2008)   Hemoccult due: Not Indicated  Colonoscopy: Done.  (09/11/2002)   Colonoscopy due: 09/10/2012  Other Screening   PSA: is followed at urology w psa s/p prostate cancer  (09/25/2008)   PSA due due: Not Indicated   Smoking status: quit  (01/08/2010)  Lipids   Total Cholesterol: 131  (09/27/2009)   Lipid panel action/deferral: LDL Direct Ordered   LDL: 60  (09/27/2009)   LDL Direct: Not documented   HDL: 55  (09/27/2009)   Triglycerides: 81  (09/27/2009)    SGOT (AST): 17  (09/27/2009)   SGPT (ALT): 15  (09/27/2009)   Alkaline phosphatase: 76  (09/27/2009)   Total bilirubin: 1.7  (09/27/2009)  Hypertension   Last Blood Pressure: 140 / 72  (01/08/2010)   Serum creatinine: 1.27  (12/25/2009)   Serum potassium 4.2  (12/25/2009)    Hypertension flowsheet reviewed?: Yes   Progress toward BP goal: At goal  Self-Management Support :    Hypertension self-management support: Not documented    Lipid self-management support: Not documented

## 2010-08-12 NOTE — Letter (Signed)
Summary: Follow up letter, [facility]  [address]   [place], [place] [address]   Phone: [phone]  Fax: [phone]    12/26/2009  [place], [place]  [address]  Dear [name],  It was really nice to see you for the Wellness Visit. The blood work completed indicated that your thyroid is functioning normally.  I alerted Dr. Nori Riis about your cough, and your use of potassium salts on your food.  She will be making a decision regarding your hypertension medications.    You should hear from our nursing staff when the ultrasound of the aorta has been scheduled.  As a reminder:  1. Increase insoluble fiber in your diet, a 1/2 cup of Fiber One Cereal is recommended 3-5 times per week.  2. In order to control you leg edema it is important to elevate your legs above the level of your heart for 30 minutes mid day.  3. Begin to take 1000 International Units  of Vitamin D daily.  4. Exercises nearly every day.  5. Follow up on receiving your shingles shot(Zostavax)  I would also like you to make an appointment with Dr. Nori Riis for follow up regarding your concern about your memory.  We need to follow this over time.  Keep your mind active, by continuing your hobbies and reading.  If you have any further questions, please feel free to call me.     Sincerely,   Zachary Coop NP  Appended Document: Follow up letter, [facility] MAILED.

## 2010-08-12 NOTE — Assessment & Plan Note (Signed)
Summary: F/U & LABS/EO   Vital Signs:  Patient profile:   77 year old male Height:      67 inches Weight:      179.3 pounds BMI:     28.18 Temp:     98.4 degrees F oral Pulse rate:   92 / minute BP sitting:   151 / 72  (left arm) Cuff size:   regular  Vitals Entered By: Isabelle Course (October 02, 2009 12:00 PM)  Serial Vital Signs/Assessments:  Time      Position  BP       Pulse  Resp  Temp     By                     130/80                         Dorcas Mcmurray MD  CC: F/u Is Patient Diabetic? No Pain Assessment Patient in pain? no        Primary Care Provider:  Dorcas Mcmurray MD  CC:  F/u.  History of Present Illness: f/u change in BP med. no adverse effects from increase to 10 of norvasc.  f/u memory problems: has felt a little more "with it" since weather has improved. Doing more things. not as worried about this. Notably for his chronic low back pain one day he took a vicodin he had left over from the kidney stone episode. Michela Pitcher it was like a miracle--he was able to do all kinds of things around house and in yard and actually felt like doing them.   here to review labs  Habits & Providers  Alcohol-Tobacco-Diet     Tobacco Status: never  Current Medications (verified): 1)  Bayer Aspirin 325 Mg Tabs (Aspirin) .... Take 1 Tablet By Mouth Once A Day 2)  Klonopin 0.5 Mg Tabs (Clonazepam) .... Take 1 Tablet By Mouth At Bedtime 3)  Lipitor 10 Mg Tabs (Atorvastatin Calcium) .... Take 1 Tablet By Mouth Once A Day 4)  Norvasc 10 Mg Tabs (Amlodipine Besylate) .Marland Kitchen.. 1 By Mouth Qd 5)  Omeprazole 20 Mg Cpdr (Omeprazole) .Marland Kitchen.. 1 By Mouth Qd 6)  Base B Polyethylene Glycol   Powd (Polyethylene Glycol 3350) .Marland KitchenMarland KitchenMarland Kitchen 17 G of Powder Mixed With Water Every Other Day Disp Qs For 3 Months 7)  Nitrolingual 0.4 Mg/spray Tl Soln (Nitroglycerin) .... One Spray As Needed Exertional Chest Pain. 8)  Proventil Hfa 108 (90 Base) Mcg/act  Aers (Albuterol Sulfate) .... 2 P Qid As Needed 9)  Vicodin 5-500 Mg  Tabs (Hydrocodone-Acetaminophen) .Marland Kitchen.. 1 By Mouth Two Times A Day As Needed Pain  Allergies: 1)  ! Sulfa  Review of Systems  The patient denies anorexia, fever, weight loss, and weight gain.    Physical Exam  General:  alert and well-developed.   Lungs:  normal breath sounds.   Heart:  normal rate, regular rhythm, and no gallop.   Neurologic:  alert & oriented X3, strength normal in all extremities, and gait normal.   Psych:  Oriented X3.  Seems much more alert and happier than at last visit. More interactive   Impression & Recommendations:  Problem # 1:  MEMORY LOSS (ICD-780.93)  given his improvement wit the "weather change" and his ability to do more things on low dose vicodin, I am wondering if a little seasonal affective disorder or outright depression is part of this. I wonder if the additional piece is his under-reporting of  his chronic back pain. we will try low dose regular pain relief and see him back  Orders: [facility]- Est  Level 4 [id number])  Problem # 2:  HYPERTENSION, BENIGN SYSTEMIC (ICD-401.1) Assessment: Improved  His updated medication list for this problem includes:    Norvasc 10 Mg Tabs (Amlodipine besylate) .Marland Kitchen... 1 by mouth qd better control  Orders: [place]- Est  Level 4 [id number])  Complete Medication List: 1)  Bayer Aspirin 325 Mg Tabs (Aspirin) .... Take 1 tablet by mouth once a day 2)  Klonopin 0.5 Mg Tabs (Clonazepam) .... Take 1 tablet by mouth at bedtime 3)  Lipitor 10 Mg Tabs (Atorvastatin calcium) .... Take 1 tablet by mouth once a day 4)  Norvasc 10 Mg Tabs (Amlodipine besylate) .Marland Kitchen.. 1 by mouth qd 5)  Omeprazole 20 Mg Cpdr (Omeprazole) .Marland Kitchen.. 1 by mouth qd 6)  Base B Polyethylene Glycol Powd (Polyethylene glycol 3350) .Marland KitchenMarland KitchenMarland Kitchen 17 g of powder mixed with water every other day disp qs for 3 months 7)  Nitrolingual 0.4 Mg/spray Tl Soln (Nitroglycerin) .... One spray as needed exertional chest pain. 8)  Proventil Hfa 108 (90 Base) Mcg/act Aers (Albuterol sulfate)  .... 2 p qid as needed 9)  Vicodin 5-500 Mg Tabs (Hydrocodone-acetaminophen) .Marland Kitchen.. 1 by mouth two times a day as needed pain  Patient Instructions: 1)  Let's try the hydrocodone on a regular basis for your backpain. OK to take one in am and one in PM. 2)  Let me see you in about 6 weeks and we will follow up on this. 3)  Great to see you! Prescriptions: VICODIN 5-500 MG TABS (HYDROCODONE-ACETAMINOPHEN) 1 by mouth two times a day as needed pain  #60 x 2   Entered and Authorized by:   Dorcas Mcmurray MD   Signed by:   Dorcas Mcmurray MD on 10/02/2009   Method used:   Print then Give to Patient   RxID:   [id number]

## 2010-08-12 NOTE — Miscellaneous (Signed)
Summary: ABN: [employer] possibly not covered by ins.  ABN: [employer] possibly not covered by ins.   Imported By: Samara Snide 06/12/2010 09:02:58  _____________________________________________________________________  External Attachment:    Type:   Image     Comment:   External Document

## 2010-08-12 NOTE — Miscellaneous (Signed)
  Clinical Lists Changes  Orders: Added new Test order of Comp Met-[facility] [id number]) - Signed Added new Test order of Lipid-[facility] [id number]) - Signed      Complete Medication List: 1)  Bayer Aspirin 325 Mg Tabs (Aspirin) .... Take 1 tablet by mouth once a day 2)  Klonopin 0.5 Mg Tabs (Clonazepam) .... Take 1 tablet by mouth at bedtime 3)  Lipitor 10 Mg Tabs (Atorvastatin calcium) .... Take 1 tablet by mouth once a day 4)  Norvasc 10 Mg Tabs (Amlodipine besylate) .Marland Kitchen.. 1 by mouth qd 5)  Omeprazole 20 Mg Cpdr (Omeprazole) .Marland Kitchen.. 1 by mouth qd 6)  Base B Polyethylene Glycol Powd (Polyethylene glycol 3350) .Marland KitchenMarland KitchenMarland Kitchen 17 g of powder mixed with water every other day disp qs for 3 months 7)  Nitrolingual 0.4 Mg/spray Tl Soln (Nitroglycerin) .... One spray as needed exertional chest pain. 8)  Proventil Hfa 108 (90 Base) Mcg/act Aers (Albuterol sulfate) .... 2 p qid as needed

## 2010-08-12 NOTE — Assessment & Plan Note (Signed)
Summary: fu/kh   Vital Signs:  Patient profile:   77 year old male Weight:      179.5 pounds BMI:     28.22 Temp:     98 degrees F oral Pulse rate:   64 / minute Pulse rhythm:   regular BP sitting:   156 / 84  (left arm) Cuff size:   regular  Vitals Entered By: Audelia Hives CMA (August 14, 2009 8:55 AM)  Primary Care Provider:  Dorcas Mcmurray MD   History of Present Illness: Follow up hypertension. Taking medicines regularly with no problems. Not having any any headaches or chest pains.  Unaware his BP is up--does admit he has put on a few pounds over the holidays. Plans to get back to his regular walking as soon as weather clears.  f/u pins and needles sensation--it has lessened in frequency and intensity. Still sometimes there. initially it was a sharp needle sticking in his skin on arms and legs, worse when he bent over or lifted something heavy. He has used the creams for dry skin. Not sure if that is what helped but it is some better.  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 Has not gotten lost driving but has gone to run an errand and forgotten what the errand was and had to come back hme. Denies depressive signs, sleep is unchanged, appetite and energy level unchanged. No hallucinations. Does not feel he gets confused per se, just "forgetful". Has started making lists of things he needs to remember and this seems to help. This has been a gradula change over last 1 year.  had an 8 mm kidney stone --had lithotripsy and then some complications from that--had ureteral stens etc placed twoce by his urologist. All of this has cleared up but said it was very stressful and it was right after thistaht the parasthesias started. he wondered if it was related to the various pain meds he had to take. Not taking any now,  Diet unchanged other than he  has been less faithful to it. No odd senstion to cold or heat. No particular fatigue.   Current Medications (verified): 1)  Bayer Aspirin 325 Mg Tabs (Aspirin) .... Take 1 Tablet By Mouth Once A Day 2)  Klonopin 0.5 Mg Tabs (Clonazepam) .... Take 1 Tablet By Mouth At Bedtime 3)  Lipitor 10 Mg Tabs (Atorvastatin Calcium) .... Take 1 Tablet By Mouth Once A Day 4)  Norvasc 10 Mg Tabs (Amlodipine Besylate) .Marland Kitchen.. 1 By Mouth Qd 5)  Omeprazole 20 Mg Cpdr (Omeprazole) .Marland Kitchen.. 1 By Mouth Qd 6)  Base B Polyethylene Glycol   Powd (Polyethylene Glycol 3350) .Marland KitchenMarland KitchenMarland Kitchen 17 G of Powder Mixed With Water Every Other Day Disp Qs For 3 Months 7)  Nitrolingual 0.4 Mg/spray Tl Soln (Nitroglycerin) .... One Spray As Needed Exertional Chest Pain. 8)  Proventil Hfa 108 (90 Base) Mcg/act  Aers (Albuterol Sulfate) .... 2 P Qid As Needed  Allergies (verified): 1)  ! Sulfa  Past History:  Past Medical History: Last updated: 10/19/2007 diverticulitis - recurrent,  dry eyes, mouth--seen by dr. Truman Hayward (ent),  tinnitus--chronic cardiologist Dr [name] - Cath 2008 showed EF 65% Min coronary artery irregularities Urology Dr Tresa Endo GI Dr Cristina Gong - Diverticulosis/Diverticulitis Dr Clent Jacks did his cea - carotid endarterectomy hi cholesterol  Past Surgical History:  cardiac Cath--60% lad, 75% ostial-smith - 09/12/2002,  carotid endart.--r. 1998 - 09/12/2002,  Cholecystectomy--rosenbower - 04/12/2006,  colonoscopy--diverticulosis - 09/11/2002,  Endoscopy--sliding hiatal hernia-buccini - 04/12/2006,  radical prostatectomy--1998 - 09/12/2002 lithotripsy with complications, subsequent ureteral stens 2010.  Physical Exam  General:  alert and well-developed.   Neck:  supple, full ROM, and no masses.   Lungs:  normal breath sounds.   Heart:  normal rate, regular rhythm, and no murmur.   Abdomen:  soft and non-tender.   Msk:  normal ROM.   Neurologic:  alert & oriented X3 and strength normal in all extremities.  gait is a little short  stride length. he can rise from a chair without assistance. he has no word finding difficulties. He does refer to his "list" occasionally. Asks and answers questions appropriately.   Impression & Recommendations:  Problem # 1:  HYPERTENSION, BENIGN SYSTEMIC (ICD-401.1) Assessment Deteriorated  His updated medication list for this problem includes:    Norvasc 10 Mg Tabs (Amlodipine besylate) .Marland Kitchen... 1 by mouth qd increase his norvasc to 10 mg rtc 15m  Orders: [place]- Est  Level 4 [id number])  Problem # 2:  HYPERLIPIDEMIA (ICD-272.4) Assessment: Unchanged  His updated medication list for this problem includes:    Lipitor 10 Mg Tabs (Atorvastatin calcium) .Marland Kitchen... Take 1 tablet by mouth once a day will get his labs at next visit  Orders: [facility]- Est  Level 4 [id number])  Problem # 3:  MEMORY LOSS (ICD-780.93) Assessment: New  I am not clear how serious this is. he seems to think it is all related to getting older. Will get more detail when I see him back in 3-4 w  Orders: [facility]- Est  Level 4 [id number])  Complete Medication List: 1)  Bayer Aspirin 325 Mg Tabs (Aspirin) .... Take 1 tablet by mouth once a day 2)  Klonopin 0.5 Mg Tabs (Clonazepam) .... Take 1 tablet by mouth at bedtime 3)  Lipitor 10 Mg Tabs (Atorvastatin calcium) .... Take 1 tablet by mouth once a day 4)  Norvasc 10 Mg Tabs (Amlodipine besylate) .Marland Kitchen.. 1 by mouth qd 5)  Omeprazole 20 Mg Cpdr (Omeprazole) .Marland Kitchen.. 1 by mouth qd 6)  Base B Polyethylene Glycol Powd (Polyethylene glycol 3350) .Marland KitchenMarland KitchenMarland Kitchen 17 g of powder mixed with water every other day disp qs for 3 months 7)  Nitrolingual 0.4 Mg/spray Tl Soln (Nitroglycerin) .... One spray as needed exertional chest pain. 8)  Proventil Hfa 108 (90 Base) Mcg/act Aers (Albuterol sulfate) .... 2 p qid as needed  Other Orders: [facility] [id number])  Patient Instructions: 1)  i have increased your Norvasc to 10 mg a day. It will still be one pill a day. 2)  Let me see you next month and I will recheck  your blood pressure on this new dose and we can get your lipid panel drawn then too. Come fasting for that appointment OR you can come in fasting a day or two BEFORE the appointmment and have the lab drawn. 3)  I am going to check your vitamin B 12 level today with you still having some pins and needles sensation. If this gets worse, I need to know ASAP. Prescriptions: BASE B POLYETHYLENE GLYCOL   POWD (POLYETHYLENE GLYCOL 3350) 17 g of powder mixed with water every other day disp qs for 3 months  #63m x 3   Entered and Authorized by:   Dorcas Mcmurray MD   Signed by:   Dorcas Mcmurray MD on 08/14/2009   Method used:  Print then Give to Patient   RxID:   [id number] OMEPRAZOLE 20 MG CPDR (OMEPRAZOLE) 1 by mouth qd  #90 x 3   Entered and Authorized by:   Dorcas Mcmurray MD   Signed by:   Dorcas Mcmurray MD on 08/14/2009   Method used:   Print then Give to Patient   RxID:   [id number] [place] 10 MG TABS (AMLODIPINE BESYLATE) 1 by mouth qd  #90 x 3   Entered and Authorized by:   Dorcas Mcmurray MD   Signed by:   Dorcas Mcmurray MD on 08/14/2009   Method used:   Print then Give to Patient   RxID:   [id number] [place] 20 MG CPDR (OMEPRAZOLE) 1 by mouth qd  #90 x 3   Entered and Authorized by:   Dorcas Mcmurray MD   Signed by:   Dorcas Mcmurray MD on 08/14/2009   Method used:   Print then Give to Patient   RxID:   [id number] LIPITOR 10 MG TABS (ATORVASTATIN CALCIUM) Take 1 tablet by mouth once a day  #90 x 3   Entered and Authorized by:   Dorcas Mcmurray MD   Signed by:   Dorcas Mcmurray MD on 08/14/2009   Method used:   Print then Give to Patient   RxID:   [id number]   Prevention & Chronic Care Immunizations   Influenza vaccine: given  (03/13/2008)   Influenza vaccine due: 03/13/2009    Tetanus booster: 09/25/2008: given   Tetanus booster due: 09/26/2018    Pneumococcal vaccine: given  (03/13/2005)   Pneumococcal vaccine due: None    H. zoster vaccine: Not documented  Colorectal Screening   Hemoccult: had  colonoscopy not indicated  (07/10/2008)   Hemoccult due: Not Indicated    Colonoscopy: Done.  (09/11/2002)   Colonoscopy due: 09/10/2012  Other Screening   PSA: is followed at urology w psa s/p prostate cancer  (09/25/2008)   PSA due due: Not Indicated   Smoking status: never  (07/19/2009)  Lipids   Total Cholesterol: 142  (09/25/2008)   Lipid panel action/deferral: LDL Direct Ordered   LDL: 70  (09/25/2008)   LDL Direct: Not documented   HDL: 53  (09/25/2008)   Triglycerides: 96  (09/25/2008)    SGOT (AST): 16  (07/19/2009)   SGPT (ALT): 14  (07/19/2009)   Alkaline phosphatase: 73  (07/19/2009)   Total bilirubin: 2.0  (07/19/2009)    Lipid flowsheet reviewed?: Yes   Progress toward LDL goal: At goal  Hypertension   Last Blood Pressure: 156 / 84  (08/14/2009)   Serum creatinine: 0.93  (07/19/2009)   Serum potassium 4.2  (07/19/2009)    Hypertension flowsheet reviewed?: Yes   Progress toward BP goal: Deteriorated  Self-Management Support :    Hypertension self-management support: Not documented    Lipid self-management support: Not documented

## 2010-08-12 NOTE — Letter (Signed)
Summary: Wellness Visit Letter  [facility]  [address]   [place], [place] [address]   Phone: [phone]  Fax: [phone]    11/27/2009  [place],   [address]  Dear [name],  We are happy to let you know that since you are covered under Medicare you are able to have a FREE visit at the [facility] to discuss your HEALTH. This is a new benefit for Medicare.  There will be no co-payment.  At this visit you will meet with [name] an expert in wellness and the nurse practitioner at our clinic.  At this visit we will discuss ways to keep you healthy and feeling well.  This visit will not replace your regular doctor visit and we cannot refill medications.  We may schedule future blood work, give shots if needed, or schedule tests to look for hidden problems.   You will need to plan to be here at least one hour to talk about your medical history, your current status, review all of your medications, and discuss your future plans for your health.  This information will be entered into your record for your doctor to have and review.  If you are interested in staying healthy, this type of visit can help.  Please call the office at: [phone], to schedule a "Medicare Wellness Visit".  The day of the visit you should bring in all of your medications, including any vitamins, herbs, over the counter products you take.  Make a list of all the other doctors that you see, so we know who they are. If you have any other health documents please bring them.  We look forward to helping you stay healthy.    Sincerely,   Zachary Coop NP  Appended Document: Wellness Visit Letter mailed.

## 2010-08-12 NOTE — Assessment & Plan Note (Signed)
Summary: f/u/kh   Vital Signs:  Patient profile:   77 year old male Height:      67 inches Weight:      186 pounds BMI:     29.24 Temp:     97.9 degrees F oral Pulse rate:   61 / minute BP sitting:   130 / 73  (left arm) Cuff size:   regular  Vitals Entered By: Enid Skeens, CMA (February 05, 2010 9:21 AM) CC: f/u on  various medications Is Patient Diabetic? No Pain Assessment Patient in pain? no      Comments pt. states that medication taken @ night has been giving him hallucinations   Primary Care Provider:  Dorcas Mcmurray MD  CC:  f/u on  various medications.  History of Present Illness: Follow up hypertension. Taking medicines regularly with no problems. Not having any any headaches or chest pains.  f/u starting aricept---took it three days---had severe hallucinations on third night and odd vivid dreams on second night. Dreams spilled over into wakefulness and for a while he could not tell if he was awake or asleep for a while. Stopped med and does not want to try anything else atthis time. He actually feels like he is doing pretty well  Habits & Providers  Alcohol-Tobacco-Diet     Tobacco Status: never  Current Medications (verified): 1)  Bayer Aspirin 325 Mg Tabs (Aspirin) .... Take 1 Tablet By Mouth Once A Day 2)  Klonopin 0.5 Mg Tabs (Clonazepam) .... Take 1 Tablet By Mouth At Bedtime and 1/2 Tab By Mouth Once Daily As Needed 3)  Lipitor 10 Mg Tabs (Atorvastatin Calcium) .... Take 1 Tablet By Mouth Once A Day 4)  Norvasc 10 Mg Tabs (Amlodipine Besylate) .Marland Kitchen.. 1 By Mouth Qd 5)  Omeprazole 20 Mg Cpdr (Omeprazole) .Marland Kitchen.. 1 By Mouth Qd 6)  Base B Polyethylene Glycol   Powd (Polyethylene Glycol 3350) .Marland KitchenMarland KitchenMarland Kitchen 17 G of Powder Mixed With Water Every Other Day Disp Qs For 3 Months 7)  Nitrolingual 0.4 Mg/spray Tl Soln (Nitroglycerin) .... One Spray As Needed Exertional Chest Pain. 8)  Proventil Hfa 108 (90 Base) Mcg/act  Aers (Albuterol Sulfate) .... 2 P Qid As Needed 9)  Vicodin 5-500  Mg Tabs (Hydrocodone-Acetaminophen) .Marland Kitchen.. 1 By Mouth Two Times A Day As Needed Pain 10)  Triamcinolone Acetonide 0.1 % Lotn (Triamcinolone Acetonide) 11)  Hyzaar 100-12.5 Mg Tabs (Losartan Potassium-Hctz) .Marland Kitchen.. 1 By Mouth Qd 12)  Aricept 5 Mg Tabs (Donepezil Hcl) .Marland Kitchen.. 1 By Mouth Qd 13)  Cetirizine Hcl 10 Mg Tabs (Cetirizine Hcl) .Marland Kitchen.. 1 By Mouth Once Daily As Needed Allergies  Allergies: 1)  ! Sulfa  Social History: Smoking Status:  never  Review of Systems       The patient complains of weight gain.  The patient denies anorexia, weight loss, chest pain, syncope, dyspnea on exertion, and peripheral edema.         Please see HPI for additional ROS.   Physical Exam  General:  alert, well-developed, well-nourished, and well-hydrated.   Ears:  hearing aids in place Neck:  well healed scar left necksupple, full ROM, no masses, no JVD, and no carotid bruits.   Lungs:  normal respiratory effort and normal breath sounds.   Heart:  normal rate, regular rhythm, and no murmur.   Neurologic:  alert & oriented X3.   Psych:  Oriented X3, memory intact for recent and remote, normally interactive, good eye contact, not anxious appearing, and not depressed appearing.  Impression & Recommendations:  Problem # 1:  HYPERTENSION, BENIGN SYSTEMIC (ICD-401.1)  His updated medication list for this problem includes:    Norvasc 10 Mg Tabs (Amlodipine besylate) .Marland Kitchen... 1 by mouth qd    Hyzaar 100-12.5 Mg Tabs (Losartan potassium-hctz) .Marland Kitchen... 1 by mouth qd good control needs manual BP check each time  Orders: [place]- Est Level  3 [id number])  Problem # 2:  MEMORY LOSS (ICD-780.93)  side effects from aricept--he does not want to pursue any other meds or work up at this time will continue to follow  Orders: [facility]- Est Level  3 [id number])  Complete Medication List: 1)  Bayer Aspirin 325 Mg Tabs (Aspirin) .... Take 1 tablet by mouth once a day 2)  Klonopin 0.5 Mg Tabs (Clonazepam) .... Take 1 tablet by mouth  at bedtime and 1/2 tab by mouth once daily as needed 3)  Lipitor 10 Mg Tabs (Atorvastatin calcium) .... Take 1 tablet by mouth once a day 4)  Norvasc 10 Mg Tabs (Amlodipine besylate) .Marland Kitchen.. 1 by mouth qd 5)  Omeprazole 20 Mg Cpdr (Omeprazole) .Marland Kitchen.. 1 by mouth qd 6)  Base B Polyethylene Glycol Powd (Polyethylene glycol 3350) .Marland KitchenMarland KitchenMarland Kitchen 17 g of powder mixed with water every other day disp qs for 3 months 7)  Nitrolingual 0.4 Mg/spray Tl Soln (Nitroglycerin) .... One spray as needed exertional chest pain. 8)  Proventil Hfa 108 (90 Base) Mcg/act Aers (Albuterol sulfate) .... 2 p qid as needed 9)  Vicodin 5-500 Mg Tabs (Hydrocodone-acetaminophen) .Marland Kitchen.. 1 by mouth two times a day as needed pain 10)  Triamcinolone Acetonide 0.1 % Lotn (Triamcinolone acetonide) 11)  Hyzaar 100-12.5 Mg Tabs (Losartan potassium-hctz) .Marland Kitchen.. 1 by mouth qd 12)  Aricept 5 Mg Tabs (Donepezil hcl) .Marland Kitchen.. 1 by mouth qd 13)  Cetirizine Hcl 10 Mg Tabs (Cetirizine hcl) .Marland Kitchen.. 1 by mouth once daily as needed allergies  Patient Instructions: 1)  Great to see you 2)  Agree with stopping the Aricept. 3)  Blood pressure is looking BETTER! 4)  See me in 2-3 months  Prevention & Chronic Care Immunizations   Influenza vaccine: given  (03/13/2008)   Influenza vaccine due: 03/13/2009    Tetanus booster: 09/25/2008: given   Tetanus booster due: 09/26/2018    Pneumococcal vaccine: given  (03/13/2005)   Pneumococcal vaccine due: None    H. zoster vaccine: Not documented  Colorectal Screening   Hemoccult: had colonoscopy not indicated  (07/10/2008)   Hemoccult due: Not Indicated    Colonoscopy: Done.  (09/11/2002)   Colonoscopy due: 09/10/2012  Other Screening   PSA: is followed at urology w psa s/p prostate cancer  (09/25/2008)   PSA due due: Not Indicated   Smoking status: never  (02/05/2010)  Lipids   Total Cholesterol: 131  (09/27/2009)   Lipid panel action/deferral: LDL Direct Ordered   LDL: 60  (09/27/2009)   LDL Direct: Not  documented   HDL: 55  (09/27/2009)   Triglycerides: 81  (09/27/2009)    SGOT (AST): 17  (09/27/2009)   SGPT (ALT): 15  (09/27/2009)   Alkaline phosphatase: 76  (09/27/2009)   Total bilirubin: 1.7  (09/27/2009)    Lipid flowsheet reviewed?: Yes   Progress toward LDL goal: At goal  Hypertension   Last Blood Pressure: 130 / 73  (02/05/2010)   Serum creatinine: 1.39  (01/08/2010)   Serum potassium 4.3  (01/08/2010)    Hypertension flowsheet reviewed?: Yes   Progress toward BP goal: At goal  Self-Management Support :  Hypertension self-management support: Not documented    Lipid self-management support: Not documented    Appended Document: f/u/kh repeat MANUAL bp was 120/77  I think the automated machine is not an ioptimal way to check him--will do manula BPs in future

## 2010-08-12 NOTE — Assessment & Plan Note (Signed)
Summary: shingles inj,tcb  Nurse Visit   Vital Signs:  Patient profile:   77 year old male Temp:     98.1 degrees F Pulse rate:   56 / minute BP sitting:   140 / 64  (left arm)  Vitals Entered By: Marcell Barlow RN (February 07, 2010 11:47 AM)  Allergies: 1)  ! Sulfa  Immunizations Administered:  Zostavax # 1:    Vaccine Type: Zostavax    Site: right arm    Mfr: Merck    Dose: 0.5 ml    Route: [place]    Given by: Marcell Barlow RN    Exp. Date: 07/27/2010    Lot #: [id number]    VIS given: 04/24/05 given February 07, 2010.  Orders Added: 1)  Zoster (Shingles) Vaccine Live [id number] 2)  Admin 1st Vaccine [id number]  will forward to MD to review BP reading today.  Marcell Barlow RN  February 07, 2010 11:52 AM

## 2010-08-12 NOTE — Assessment & Plan Note (Signed)
Summary: back/chest pain,df   Vital Signs:  Patient profile:   77 year old male Height:      67 inches Weight:      179 pounds BMI:     28.14 Temp:     97.8 degrees F oral Pulse rate:   56 / minute BP sitting:   154 / 72  (left arm)  Vitals Entered By: Schuyler Amor CMA (December 18, 2009 8:32 AM)  Serial Vital Signs/Assessments:  Time      Position  BP       Pulse  Resp  Temp     By                     127/72                         Dorcas Mcmurray MD  CC: soreness in chest. F/U BP Is Patient Diabetic? No  Hearing Screen  20db HL: Left  500 hz: 40db 1000 hz: 40db 2000 hz: No Response 4000 hz: No Response Right  500 hz: 20db 1000 hz: 25db 2000 hz: 40db 4000 hz: 40db   Hearing Testing Entered By: Isabelle Course (December 18, 2009 9:11 AM)   Primary Care Provider:  Dorcas Mcmurray MD  CC:  soreness in chest. F/U BP.  History of Present Illness: f/u htn: doing well--his cardiologist added some additional meds as his BP was up at  his ov. he is having noprobklems w them, taking regularly.  2) Hearing loss--here w wife today--they report increasing trouble wiyth esp speech discrimination in loud places but overall hearing loss. he has had some known loss since the army--this is worse than it used to be  3) chest pain--very focal area. Stated 5 weeks ago when he slipped gettingout of bed, caught himself on way down with right arm. next day noticed this pain which is sharp, does not radiate, no assoc symptoms, onlyoccurs with certain motions and is bothersome at night with exhalations. No cough,  Habits & Providers  Alcohol-Tobacco-Diet     Tobacco Status: quit  Current Medications (verified): 1)  Bayer Aspirin 325 Mg Tabs (Aspirin) .... Take 1 Tablet By Mouth Once A Day 2)  Klonopin 0.5 Mg Tabs (Clonazepam) .... Take 1 Tablet By Mouth At Bedtime 3)  Lipitor 10 Mg Tabs (Atorvastatin Calcium) .... Take 1 Tablet By Mouth Once A Day 4)  Norvasc 10 Mg Tabs (Amlodipine Besylate) .Marland Kitchen.. 1 By  Mouth Qd 5)  Omeprazole 20 Mg Cpdr (Omeprazole) .Marland Kitchen.. 1 By Mouth Qd 6)  Base B Polyethylene Glycol   Powd (Polyethylene Glycol 3350) .Marland KitchenMarland KitchenMarland Kitchen 17 G of Powder Mixed With Water Every Other Day Disp Qs For 3 Months 7)  Nitrolingual 0.4 Mg/spray Tl Soln (Nitroglycerin) .... One Spray As Needed Exertional Chest Pain. 8)  Proventil Hfa 108 (90 Base) Mcg/act  Aers (Albuterol Sulfate) .... 2 P Qid As Needed 9)  Vicodin 5-500 Mg Tabs (Hydrocodone-Acetaminophen) .Marland Kitchen.. 1 By Mouth Two Times A Day As Needed Pain 10)  Hydrochlorothiazide 25 Mg Tabs (Hydrochlorothiazide) .Marland Kitchen.. 1 By Mouth Qd 11)  Lisinopril 10 Mg Tabs (Lisinopril) .Marland Kitchen.. 1 By Mouth Qd 12)  Klor-Con M10 10 Meq Cr-Tabs (Potassium Chloride Crys Cr) .Marland Kitchen.. 1 By Mouth Qd  Allergies: 1)  ! Sulfa  Past History:  Past Medical History: Last updated: 10/19/2007 diverticulitis - recurrent,  dry eyes, mouth--seen by dr. Truman Hayward (ent),  tinnitus--chronic cardiologist Dr [name] - Cath 2008 showed  EF 65% Min coronary artery irregularities Urology Dr Tresa Endo GI Dr Cristina Gong - Diverticulosis/Diverticulitis Dr Clent Jacks did his cea - carotid endarterectomy hi cholesterol  Past Surgical History: Last updated: 08/14/2009 cardiac Cath--60% lad, 75% ostial-smith - 09/12/2002,  carotid endart.--r. 1998 - 09/12/2002,  Cholecystectomy--rosenbower - 04/12/2006,  colonoscopy--diverticulosis - 09/11/2002,  Endoscopy--sliding hiatal hernia-buccini - 04/12/2006,  radical prostatectomy--1998 - 09/12/2002 lithotripsy with complications, subsequent ureteral stens 2010.  Social History: Smoking Status:  quit  Physical Exam  General:  alert and well-developed.   Ears:  R ear normal and L ear normal.   Neck:  supple, full ROM, no masses, no thyromegaly, no JVD, and no carotid bruits.   Lungs:  normal respiratory effort and normal breath sounds.   Heart:  normal rate, regular rhythm, and no murmur.   Abdomen:  soft and non-tender.   Msk:  focal area of TTP at rib-sternal border,  anterior on right. No deformity seen. No erythema or warmth,   Impression & Recommendations:  Problem # 1:  HYPERTENSION, BENIGN SYSTEMIC (ICD-401.1)  His updated medication list for this problem includes:    Norvasc 10 Mg Tabs (Amlodipine besylate) .Marland Kitchen... 1 by mouth qd    Hydrochlorothiazide 25 Mg Tabs (Hydrochlorothiazide) .Marland Kitchen... 1 by mouth qd    Lisinopril 10 Mg Tabs (Lisinopril) .Marland Kitchen... 1 by mouth qd  Orders: [place]- Est  Level 4 ([id number]) CXR- 2view (CXR)Future Orders: Basic Met-[facility] [id number]) ... 12/19/2010 I think his BP is well controlled--he has a lot of anxiety and I thik this contributes to einitial elevated BP for him. recheck is always better at end of visit  Problem # 2:  NOISE-INDUCED HEARING LOSS (ICD-388.12)  Orders: [place]- Est  Level 4 [id number])  Problem # 3:  COSTOCHONDRITIS, ACUTE (ICD-733.6)  Orders: [place]- Est  Level 4 (99214) CXR- 2view (CXR) will get chest x ray tp rule out other possibilities such as abscess, pneumonia etc given his hx of pain withbreathing.If this si negative, and I think it will be, will have him f/u w me next week [facility] for further evalof this recalcitrant costochindritis.  Complete Medication List: 1)  Bayer Aspirin 325 Mg Tabs (Aspirin) .... Take 1 tablet by mouth once a day 2)  Klonopin 0.5 Mg Tabs (Clonazepam) .... Take 1 tablet by mouth at bedtime 3)  Lipitor 10 Mg Tabs (Atorvastatin calcium) .... Take 1 tablet by mouth once a day 4)  Norvasc 10 Mg Tabs (Amlodipine besylate) .Marland Kitchen.. 1 by mouth qd 5)  Omeprazole 20 Mg Cpdr (Omeprazole) .Marland Kitchen.. 1 by mouth qd 6)  Base B Polyethylene Glycol Powd (Polyethylene glycol 3350) .Marland KitchenMarland KitchenMarland Kitchen 17 g of powder mixed with water every other day disp qs for 3 months 7)  Nitrolingual 0.4 Mg/spray Tl Soln (Nitroglycerin) .... One spray as needed exertional chest pain. 8)  Proventil Hfa 108 (90 Base) Mcg/act Aers (Albuterol sulfate) .... 2 p qid as needed 9)  Vicodin 5-500 Mg Tabs (Hydrocodone-acetaminophen) .Marland Kitchen.. 1 by mouth  two times a day as needed pain 10)  Hydrochlorothiazide 25 Mg Tabs (Hydrochlorothiazide) .Marland Kitchen.. 1 by mouth qd 11)  Lisinopril 10 Mg Tabs (Lisinopril) .Marland Kitchen.. 1 by mouth qd 12)  Klor-con M10 10 Meq Cr-tabs (Potassium chloride crys cr) .Marland Kitchen.. 1 by mouth qd  Prevention & Chronic Care Immunizations   Influenza vaccine: given  (03/13/2008)   Influenza vaccine due: 03/13/2009    Tetanus booster: 09/25/2008: given   Tetanus booster due: 09/26/2018    Pneumococcal vaccine: given  (03/13/2005)   Pneumococcal vaccine due:  None    H. zoster vaccine: Not documented  Colorectal Screening   Hemoccult: had colonoscopy not indicated  (07/10/2008)   Hemoccult due: Not Indicated    Colonoscopy: Done.  (09/11/2002)   Colonoscopy due: 09/10/2012  Other Screening   PSA: is followed at urology w psa s/p prostate cancer  (09/25/2008)   PSA due due: Not Indicated   Smoking status: quit  (12/18/2009)  Lipids   Total Cholesterol: 131  (09/27/2009)   Lipid panel action/deferral: LDL Direct Ordered   LDL: 60  (09/27/2009)   LDL Direct: Not documented   HDL: 55  (09/27/2009)   Triglycerides: 81  (09/27/2009)    SGOT (AST): 17  (09/27/2009)   SGPT (ALT): 15  (09/27/2009)   Alkaline phosphatase: 76  (09/27/2009)   Total bilirubin: 1.7  (09/27/2009)    Lipid flowsheet reviewed?: Yes   Progress toward LDL goal: Improved  Hypertension   Last Blood Pressure: 154 / 72  (12/18/2009)   Serum creatinine: 0.98  (09/27/2009)   Serum potassium 4.1  (09/27/2009)    Hypertension flowsheet reviewed?: Yes   Progress toward BP goal: Unchanged  Self-Management Support :    Hypertension self-management support: Not documented    Lipid self-management support: Not documented

## 2010-08-12 NOTE — Letter (Signed)
Summary: Generic Letter: AORTA US  [facility]  [address]   [place], [place] [address]   Phone: [phone]  Fax: [phone]    12/31/2009  [facility] [place], [place]  [address]  Dear [name],  Aorta ultrasound was normal.         Sincerely,   Dorcas Mcmurray MD  Appended Document: Generic Letter: AORTA US mailed

## 2010-08-12 NOTE — Assessment & Plan Note (Signed)
Summary: sensation of needles pricking him all over his body,tcb   Vital Signs:  Patient profile:   77 year old male Weight:      176.7 pounds Pulse rate:   60 / minute BP sitting:   165 / 84  (right arm)  Vitals Entered By: Gerrit Heck slade,cma CC: prickling feelings in torso and legs x 3 1/2 weeks. when bending feels like needles in body. Is Patient Diabetic? No Pain Assessment Patient in pain? no        Primary Care Provider:  Dorcas Mcmurray MD  CC:  prickling feelings in torso and legs x 3 1/2 weeks. when bending feels like needles in body.Marland Kitchen  History of Present Illness: 3 and 1/2 weeks of odd intermittent sensations, described a pins and needles all over his body.  FIrst noticed when bending over to do somthing below the waist.  Then noticed when walking, and felt like "prickly heat", then when he rubbed his hands together.  He feels hot all over as well, this sensation is more constant.  This is a total body sensation, trunk and extremities, sparing his hands.  Sleeping more than ususal.  Endorses chronic depression but still enjoying life and involved.  Back has been causing him problems, after he carries anything thing heavy, like groceries his back hurts and he takes ibuprofen.    Had significant kindey stone in November, requiring lithotripsey, stent, and laser.  Habits & Providers  Alcohol-Tobacco-Diet     Tobacco Status: never  Current Medications (verified): 1)  Bayer Aspirin 325 Mg Tabs (Aspirin) .... Take 1 Tablet By Mouth Once A Day 2)  Klonopin 0.5 Mg Tabs (Clonazepam) .... Take 1 Tablet By Mouth At Bedtime 3)  Lipitor 10 Mg Tabs (Atorvastatin Calcium) .... Take 1 Tablet By Mouth Once A Day 4)  Norvasc 5 Mg Tabs (Amlodipine Besylate) .... Take 1 Tablet By Mouth Once A Day 5)  Prilosec 20 Mg Cpdr (Omeprazole) .Marland Kitchen.. 1 By Mouth Qd 6)  Base B Polyethylene Glycol   Powd (Polyethylene Glycol 3350) .Marland KitchenMarland KitchenMarland Kitchen 17 G of Powder Mixed With Water Once Daily Prn 7)  Nitrolingual 0.4  Mg/spray Tl Soln (Nitroglycerin) .... One Spray As Needed Exertional Chest Pain. 8)  Prevacid 30 Mg  Cpdr (Lansoprazole) .Marland Kitchen.. 1 By Mouth Qd 9)  Proventil Hfa 108 (90 Base) Mcg/act  Aers (Albuterol Sulfate) .... 2 P Qid As Needed 10)  Fluticasone Propionate 50 Mcg/act  Susp (Fluticasone Propionate) .... 2 Sprays Each Nostril Once Daily 11)  Tessalon 200 Mg  Caps (Benzonatate) .Marland Kitchen.. 1 By Mouth Q 8 Hrs As Needed Cough  Allergies (verified): 1)  ! Sulfa  Social History: Smoking Status:  never  Review of Systems General:  Denies chills, fatigue, loss of appetite, malaise, sleep disorder, sweats, weakness, and weight loss. MS:  Complains of low back pain. Derm:  Complains of dryness.  Physical Exam  General:  Well appearing, in no distress Lungs:  normal respiratory effort and normal breath sounds.   Heart:  normal rate, regular rhythm, and no murmur.   Msk:  normal ROM.  Good flexibility of lumbar spine. Extremities:  Well developed, good muscle tone Skin:  Dry and rashy   Impression & Recommendations:  Problem # 1:  PARESTHESIA (ICD-782.0) Unusual presentation and non anatomical.  Blood work as below.  Follow up with primary MD. Orders: Comp Met-[facility] [id number]) Vit D, 25 OH-[facility] [id number]) TSH-[facility] [id number]) CBC-[facility] [id number]) Radiology other (Radiology Other) [facility]- Est  Level 4 [id number])  Problem #  2:  BACK PAIN, LUMBAR (ICD-724.2)  LS film, early sponfylosis His updated medication list for this problem includes:    Bayer Aspirin 325 Mg Tabs (Aspirin) .Marland Kitchen... Take 1 tablet by mouth once a day  Orders: [place]- Est  Level 4 [id number])  Problem # 3:  ASTEATOTIC ECZEMA (ICD-706.8) Recommended lubrication with cream daily.  Complete Medication List: 1)  Bayer Aspirin 325 Mg Tabs (Aspirin) .... Take 1 tablet by mouth once a day 2)  Klonopin 0.5 Mg Tabs (Clonazepam) .... Take 1 tablet by mouth at bedtime 3)  Lipitor 10 Mg Tabs (Atorvastatin calcium) .... Take 1 tablet by mouth  once a day 4)  Norvasc 5 Mg Tabs (Amlodipine besylate) .... Take 1 tablet by mouth once a day 5)  Prilosec 20 Mg Cpdr (Omeprazole) .Marland Kitchen.. 1 by mouth qd 6)  Base B Polyethylene Glycol Powd (Polyethylene glycol 3350) .Marland KitchenMarland KitchenMarland Kitchen 17 g of powder mixed with water once daily prn 7)  Nitrolingual 0.4 Mg/spray Tl Soln (Nitroglycerin) .... One spray as needed exertional chest pain. 8)  Proventil Hfa 108 (90 Base) Mcg/act Aers (Albuterol sulfate) .... 2 p qid as needed  Patient Instructions: 1)  Lubricate skin with cream daily, after shower 2)  Follow up apt with Dr. Nori Riis in 2-3 weeks

## 2010-08-12 NOTE — Letter (Signed)
Summary: LAB Letter  [facility]  [address]   [place], [place] [address]   Phone: [phone]  Fax: [phone]    08/16/2009  [place], [place]  [address]  Dear Mr. LAMPING,   Your vitamin B 12 level was normal.        Sincerely,   Dorcas Mcmurray MD  Appended Document: LAB Letter mailed.

## 2010-08-12 NOTE — Miscellaneous (Signed)
Summary: flu vaccine given  Clinical Lists Change  received notification from [facility] that patient received Flu vaccine on 04/01/2010. Marcell Barlow RN  April 01, 2010 4:58 PM  Observations: Added new observation of FLU VAX: Historical (04/01/2010 16:57)      Influenza Immunization History:    Influenza # 1:  Historical (04/01/2010)

## 2010-08-14 NOTE — Assessment & Plan Note (Signed)
Summary: 66m f/u bmc   Vital Signs:  Patient profile:   77 year old male Height:      67 inches Weight:      198 pounds Temp:     98.1 degrees F oral Pulse rate:   60 / minute Pulse rhythm:   regular BP sitting:   165 / 79  (right arm) Cuff size:   regular  Vitals Entered By: Audelia Hives CMA (August 06, 2010 1:49 PM) CC: follow-up visit Is Patient Diabetic? No Pain Assessment Patient in pain? no        Primary Care Provider:  Dorcas Mcmurray MD  CC:  follow-up visit.  History of Present Illness: f/u htn--was having some leg fatigue with his daily walks so he stopped his hyzaar about 3 weeks ago. here for f/u. still having some ankle swelling from the norvasc but is taking that  2) occasional low back and other joint pains---some days not too bad. other days pretty miserable. the hydrocodone helps--he taes 1/2 tab some days. his wife is here with him and she agrees he tqakes very little but it does seem to help   3) needs refill on sl ntg spray--does not remember last time he used  it but itis out of date. 4)Follow-up hyperlipidemia. Trying to follow a good diet, taking medicines regularly. Not having any problems with medicines, no myalgias and no fatigue.  5) continues with some forgetfulness.   .  Habits & Providers  Alcohol-Tobacco-Diet     Alcohol drinks/day: 1     Tobacco Status: quit > 6 months     Year Quit: 1985     Pack years: 46     Diet Comments: healhy     Diet Counseling: not indicated; diet is assessed to be healthy  Exercise-Depression-Behavior     Have you felt down or hopeless? no     Have you felt little pleasure in things? no     Depression Counseling: not indicated; screening negative for depression     Seat Belt Use: always  Current Medications (verified): 1)  Bayer Aspirin 325 Mg Tabs (Aspirin) .... Take 1 Tablet By Mouth Once A Day 2)  Klonopin 0.5 Mg Tabs (Clonazepam) .... Take 1 Tablet By Mouth At Bedtime and 1/2 Tab By Mouth Once Daily  As Needed 3)  Lipitor 10 Mg Tabs (Atorvastatin Calcium) .... Take 1 Tablet By Mouth Once A Day 4)  Norvasc 10 Mg Tabs (Amlodipine Besylate) .Marland Kitchen.. 1 By Mouth Qd 5)  Omeprazole 20 Mg Cpdr (Omeprazole) .Marland Kitchen.. 1 By Mouth Qd 6)  Nitrolingual 0.4 Mg/spray Tl Soln (Nitroglycerin) .... One Spray As Needed Exertional Chest Pain. 7)  Proventil Hfa 108 (90 Base) Mcg/act  Aers (Albuterol Sulfate) .... 2 P Qid As Needed 8)  Vicodin 5-500 Mg Tabs (Hydrocodone-Acetaminophen) .Marland Kitchen.. 1 By Mouth Two Times A Day As Needed Pain 9)  Hyzaar 50-12.5 Mg Tabs (Losartan Potassium-Hctz) .Marland Kitchen.. 1 By Mouth Qd 10)  Cetirizine Hcl 10 Mg Tabs (Cetirizine Hcl) .Marland Kitchen.. 1 By Mouth Once Daily As Needed Allergies  Allergies: 1)  ! Sulfa  Review of Systems  The patient denies fever, weight loss, weight gain, chest pain, syncope, prolonged cough, headaches, and abdominal pain.    Physical Exam  General:  alert, well-developed, well-nourished, and well-hydrated.   Neck:  supple, full ROM, and no masses.   Lungs:  normal breath sounds.   Heart:  normal rate, regular rhythm, and no murmur.   Msk:  a little trouble rising  from chair ( hips stiff and painful)but able to complete without assistance. normal gait Neurologic:  alert & oriented X3.   Psych:  Oriented X3, normally interactive, good eye contact, not anxious appearing, and not depressed appearing.  occcasional word finding difficulty but minimal. answers questions appropriately    Impression & Recommendations:  Problem # 1:  HYPERTENSION, BENIGN SYSTEMIC (ICD-401.1)  Orders: Comp Met-[facility] [id number]) [place]- Est  Level 4 [id number]) will decrease his hyzaar by half and rtc 3-4 w--I doubtthis was cause of his leg fatigue--he will monitor.   Problem # 2:  CORONARY, ARTERIOSCLEROSIS (ICD-414.00) seems stable refill ntg  Problem # 3:  BACK PAIN, LUMBAR (ICD-724.2) arthralgias, continue vicodin as needed. using very little  Complete Medication List: 1)  Bayer Aspirin 325 Mg  Tabs (Aspirin) .... Take 1 tablet by mouth once a day 2)  Klonopin 0.5 Mg Tabs (Clonazepam) .... Take 1 tablet by mouth at bedtime and 1/2 tab by mouth once daily as needed 3)  Lipitor 10 Mg Tabs (Atorvastatin calcium) .... Take 1 tablet by mouth once a day 4)  Norvasc 10 Mg Tabs (Amlodipine besylate) .Marland Kitchen.. 1 by mouth qd 5)  Omeprazole 20 Mg Cpdr (Omeprazole) .Marland Kitchen.. 1 by mouth qd 6)  Nitrolingual 0.4 Mg/spray Tl Soln (Nitroglycerin) .... One spray as needed exertional chest pain. 7)  Proventil Hfa 108 (90 Base) Mcg/act Aers (Albuterol sulfate) .... 2 p qid as needed 8)  Vicodin 5-500 Mg Tabs (Hydrocodone-acetaminophen) .Marland Kitchen.. 1 by mouth two times a day as needed pain 9)  Hyzaar 50-12.5 Mg Tabs (Losartan potassium-hctz) .Marland Kitchen.. 1 by mouth qd 10)  Cetirizine Hcl 10 Mg Tabs (Cetirizine hcl) .Marland Kitchen.. 1 by mouth once daily as needed allergies Direct LDL-[facility] [id number]) Comp Met-[facility] [id number]) [facility]- Est  Level 4 [id number]) Prescriptions: NITROLINGUAL 0.4 MG/SPRAY TL SOLN (NITROGLYCERIN) one spray as needed exertional chest pain.  #1 x 12   Entered and Authorized by:   Dorcas Mcmurray MD   Signed by:   Dorcas Mcmurray MD on 08/06/2010   Method used:   Electronically to        [facility] [phone]* (retail)       [address]       [place], [place]  [address]       Ph: [phone]       Fax: [phone]   RxID:   [id number]    Orders Added: 1)  Direct LDL-[facility] [phone] 2)  Comp Met-[facility] [id number] 3)  [place]- Est  Level 4 [id number]     Impression & Recommendations:  His updated medication list for this problem includes:    Norvasc 10 Mg Tabs (Amlodipine besylate) .Marland Kitchen... 1 by mouth qd    Hyzaar 50-12.5 Mg Tabs (Losartan potassium-hctz) .Marland Kitchen... 1 by mouth qd  Orders: Comp Met-[facility] [id number]) [place]- Est  Level 4 [id number])   His updated medication list for this problem includes:    Bayer Aspirin 325 Mg Tabs (Aspirin) .Marland Kitchen... Take 1 tablet by mouth once a day    Norvasc 10 Mg Tabs (Amlodipine besylate)  .Marland Kitchen... 1 by mouth qd    Nitrolingual 0.4 Mg/spray Tl Soln (Nitroglycerin) ..... One spray as needed exertional chest pain.    Hyzaar 50-12.5 Mg Tabs (Losartan potassium-hctz) .Marland Kitchen... 1 by mouth qd   His updated medication list for this problem includes:    Bayer Aspirin 325 Mg Tabs (Aspirin) .Marland Kitchen... Take 1 tablet by mouth once a day    Vicodin 5-500 Mg Tabs (Hydrocodone-acetaminophen) .Marland Kitchen... 1 by mouth  two times a day as needed pain  Orders: [place]- Est  Level 4 [id number])   Complete Medication List: 1)  Bayer Aspirin 325 Mg Tabs (Aspirin) .... Take 1 tablet by mouth once a day 2)  Klonopin 0.5 Mg Tabs (Clonazepam) .... Take 1 tablet by mouth at bedtime and 1/2 tab by mouth once daily as needed 3)  Lipitor 10 Mg Tabs (Atorvastatin calcium) .... Take 1 tablet by mouth once a day 4)  Norvasc 10 Mg Tabs (Amlodipine besylate) .Marland Kitchen.. 1 by mouth qd 5)  Omeprazole 20 Mg Cpdr (Omeprazole) .Marland Kitchen.. 1 by mouth qd 6)  Nitrolingual 0.4 Mg/spray Tl Soln (Nitroglycerin) .... One spray as needed exertional chest pain. 7)  Proventil Hfa 108 (90 Base) Mcg/act Aers (Albuterol sulfate) .... 2 p qid as needed 8)  Vicodin 5-500 Mg Tabs (Hydrocodone-acetaminophen) .Marland Kitchen.. 1 by mouth two times a day as needed pain 9)  Hyzaar 50-12.5 Mg Tabs (Losartan potassium-hctz) .Marland Kitchen.. 1 by mouth qd 10)  Cetirizine Hcl 10 Mg Tabs (Cetirizine hcl) .Marland Kitchen.. 1 by mouth once daily as needed allergies  Other Orders: Direct LDL-[facility] [phone])

## 2010-08-14 NOTE — Assessment & Plan Note (Signed)
Summary: cold symptoms/persistent cough/[name]/bmc   Vital Signs:  Patient profile:   77 year old male Height:      67 inches Weight:      197 pounds BMI:     30.97 Temp:     97.5 degrees F oral Pulse rate:   69 / minute BP sitting:   165 / 75  (left arm) Cuff size:   regular  Vitals Entered By: Schuyler Amor CMA (July 17, 2010 3:28 PM) CC: cold symptoms   Primary Care Provider:  Dorcas Mcmurray MD  CC:  cold symptoms.  History of Present Illness: URI Symptoms Onset: 3-5 days, but has had several uri's in the past month.  Description: Cough and sore throat. Mostly bothered by cough at night Modifying factors: Had some old cough syruip that helped some.   Symptoms Nasal discharge: No Fever: No Sore throat: Yes Cough: Yes Wheezing: No Ear pain: No GI symptoms: No Sick contacts: yes  Red Flags  Stiff neck: No Dyspnea: No Rash: No Swallowing difficulty: NO  Sinusitis Risk Factors Headache/face pain: No Double sickening: No tooth pain: No  Allergy Risk Factors Sneezing: No Itchy scratchy throat: No Seasonal symptoms: NO  Flu Risk Factors Headache: NO muscle aches: No severe fatigue: No    Habits & Providers  Alcohol-Tobacco-Diet     Alcohol drinks/day: 1     Tobacco Status: quit > 6 months     Year Quit: 1985     Pack years: 36     Diet Comments: healhy     Diet Counseling: not indicated; diet is assessed to be healthy  Current Problems (verified): 1)  Viral Uri  (ICD-465.9) 2)  Health Screening  (ICD-V70.0) 3)  Diverticulosis of Colon  (ICD-562.10) 4)  Screening Other&unspec Cardiovascular Conditions  (ICD-V81.2) 5)  Tiredness  (ICD-780.79) 6)  Dermatitis, Seborrheic  (ICD-690.10) 7)  Noise-induced Hearing Loss  (ICD-388.12) 8)  Memory Loss  (ICD-780.93) 9)  Asteatotic Eczema  (ICD-706.8) 10)  Back Pain, Lumbar  (ICD-724.2) 11)  Paresthesia  (ICD-782.0) 12)  Coronary, Arteriosclerosis  (ICD-414.00) 13)  Prostate Cancer  (ICD-185) 14)   Gastroesophageal Reflux, No Esophagitis  (ICD-530.81) 15)  Hypertension, Benign Systemic  (ICD-401.1) 16)  Hyperlipidemia  (ICD-272.4) 17)  Venous Insufficiency, Chronic  (ICD-459.81) 18)  Hernia, Hiatal, Noncongenital  (ICD-553.3) 19)  Insomnia Nos  (ICD-780.52)  Current Medications (verified): 1)  Bayer Aspirin 325 Mg Tabs (Aspirin) .... Take 1 Tablet By Mouth Once A Day 2)  Klonopin 0.5 Mg Tabs (Clonazepam) .... Take 1 Tablet By Mouth At Bedtime and 1/2 Tab By Mouth Once Daily As Needed 3)  Lipitor 10 Mg Tabs (Atorvastatin Calcium) .... Take 1 Tablet By Mouth Once A Day 4)  Norvasc 10 Mg Tabs (Amlodipine Besylate) .Marland Kitchen.. 1 By Mouth Qd 5)  Omeprazole 20 Mg Cpdr (Omeprazole) .Marland Kitchen.. 1 By Mouth Qd 6)  Base B Polyethylene Glycol   Powd (Polyethylene Glycol 3350) .Marland KitchenMarland KitchenMarland Kitchen 17 G of Powder Mixed With Water Every Other Day Disp Qs For 3 Months 7)  Nitrolingual 0.4 Mg/spray Tl Soln (Nitroglycerin) .... One Spray As Needed Exertional Chest Pain. 8)  Proventil Hfa 108 (90 Base) Mcg/act  Aers (Albuterol Sulfate) .... 2 P Qid As Needed 9)  Vicodin 5-500 Mg Tabs (Hydrocodone-Acetaminophen) .Marland Kitchen.. 1 By Mouth Two Times A Day As Needed Pain 10)  Triamcinolone Acetonide 0.1 % Lotn (Triamcinolone Acetonide) 11)  Hyzaar 100-12.5 Mg Tabs (Losartan Potassium-Hctz) .Marland Kitchen.. 1 By Mouth Qd 12)  Aricept 5 Mg Tabs (Donepezil Hcl) .Marland KitchenMarland KitchenMarland Kitchen  1 By Mouth Qd 13)  Cetirizine Hcl 10 Mg Tabs (Cetirizine Hcl) .Marland Kitchen.. 1 By Mouth Once Daily As Needed Allergies 14)  Hydrocodone-Homatropine 5-1.5 Mg/70ml Syrp (Hydrocodone-Homatropine) .... Take 1 Teaspoon  Every 4-6 Hours As Needed For Cough.  Disp Qs For 1 Month 15)  Tessalon Perles 100 Mg Caps (Benzonatate) .Marland Kitchen.. 1 Every Twice A Day As Neede For Cough  Allergies (verified): 1)  ! Sulfa  Past History:  Past Medical History: Last updated: 01/08/2010 diverticulitis - recurrent,  dry eyes, mouth--seen by dr. Truman Hayward (ent),  tinnitus--chronic cardiologist Dr [name] - Cath 2008 showed EF 65% Min  coronary artery irregularities Urology Dr Tresa Endo GI Dr Cristina Gong - Diverticulosis/Diverticulitis Dr Clent Jacks did his cea - carotid endarterectomy hi cholesterol AAA screen negative 12/2009  Past Surgical History: Last updated: 08/14/2009 cardiac Cath--60% lad, 75% ostial-smith - 09/12/2002,  carotid endart.--r. 1998 - 09/12/2002,  Cholecystectomy--rosenbower - 04/12/2006,  colonoscopy--diverticulosis - 09/11/2002,  Endoscopy--sliding hiatal hernia-buccini - 04/12/2006,  radical prostatectomy--1998 - 09/12/2002 lithotripsy with complications, subsequent ureteral stens 2010.  Family History: Last updated: Dec 31, 2009 2 sisters sudden death-MI Mother 85-heart disease, thyroid disease Father 85-heart disease, stroke  Social History: Last updated: 10/14/2007 Lives with wife--recently diagnosed with breast ca; No smoking; Occassional wine; Retired; 2 daughters live in s.e--5 grandchildren(boys). On weight watchers. Lives in [place] with wife. Retired Solicitor. Tobacco history 2 ppd x 32 years. No smoking x 25 years. No drugs.   Social History: Smoking Status:  quit > 6 months  Review of Systems       The patient complains of prolonged cough.  The patient denies anorexia, fever, weight loss, hoarseness, chest pain, syncope, abdominal pain, severe indigestion/heartburn, suspicious skin lesions, and abnormal bleeding.    Physical Exam  General:  Vs noted.  Male with cough but in NAD well appearing.  Mouth:  Oral mucosa and oropharynx without lesions or exudates.  Teeth in good repair. Lungs:  normal respiratory effort and normal breath sounds.   Heart:  normal rate, regular rhythm, and no murmur.   Abdomen:  soft and non-tender.   Extremities:  no edema Cervical Nodes:  No lymphadenopathy noted   Impression & Recommendations:  Problem # 1:  VIRAL URI (ICD-465.9) Assessment New  Think current illness is either a current or new uri or a post viral cough.  Plan to  offer conservative treatment with tylenol and rest. Will treat cough with tessalon perles during the day and hydrocodone at night as needed. See pt instructions.   His updated medication list for this problem includes:    Bayer Aspirin 325 Mg Tabs (Aspirin) .Marland Kitchen... Take 1 tablet by mouth once a day    Cetirizine Hcl 10 Mg Tabs (Cetirizine hcl) .Marland Kitchen... 1 by mouth once daily as needed allergies    Hydrocodone-homatropine 5-1.5 Mg/43ml Syrp (Hydrocodone-homatropine) .Marland Kitchen... Take 1 teaspoon  every 4-6 hours as needed for cough.  disp qs for 1 month    Tessalon Perles 100 Mg Caps (Benzonatate) .Marland Kitchen... 1 every twice a day as neede for cough  Orders: [facility]- Est Level  3 [id number])  Problem # 2:  HYPERTENSION, BENIGN SYSTEMIC (ICD-401.1) Assessment: Deteriorated Elevated today. Will follow up with Dr. Nori Riis in a few weeks for recheck.  His updated medication list for this problem includes:    Norvasc 10 Mg Tabs (Amlodipine besylate) .Marland Kitchen... 1 by mouth qd    Hyzaar 100-12.5 Mg Tabs (Losartan potassium-hctz) .Marland Kitchen... 1 by mouth qd  Complete Medication List: 1)  Bayer Aspirin  325 Mg Tabs (Aspirin) .... Take 1 tablet by mouth once a day 2)  Klonopin 0.5 Mg Tabs (Clonazepam) .... Take 1 tablet by mouth at bedtime and 1/2 tab by mouth once daily as needed 3)  Lipitor 10 Mg Tabs (Atorvastatin calcium) .... Take 1 tablet by mouth once a day 4)  Norvasc 10 Mg Tabs (Amlodipine besylate) .Marland Kitchen.. 1 by mouth qd 5)  Omeprazole 20 Mg Cpdr (Omeprazole) .Marland Kitchen.. 1 by mouth qd 6)  Base B Polyethylene Glycol Powd (Polyethylene glycol 3350) .Marland KitchenMarland KitchenMarland Kitchen 17 g of powder mixed with water every other day disp qs for 3 months 7)  Nitrolingual 0.4 Mg/spray Tl Soln (Nitroglycerin) .... One spray as needed exertional chest pain. 8)  Proventil Hfa 108 (90 Base) Mcg/act Aers (Albuterol sulfate) .... 2 p qid as needed 9)  Vicodin 5-500 Mg Tabs (Hydrocodone-acetaminophen) .Marland Kitchen.. 1 by mouth two times a day as needed pain 10)  Triamcinolone Acetonide 0.1 % Lotn  (Triamcinolone acetonide) 11)  Hyzaar 100-12.5 Mg Tabs (Losartan potassium-hctz) .Marland Kitchen.. 1 by mouth qd 12)  Aricept 5 Mg Tabs (Donepezil hcl) .Marland Kitchen.. 1 by mouth qd 13)  Cetirizine Hcl 10 Mg Tabs (Cetirizine hcl) .Marland Kitchen.. 1 by mouth once daily as needed allergies 14)  Hydrocodone-homatropine 5-1.5 Mg/47ml Syrp (Hydrocodone-homatropine) .... Take 1 teaspoon  every 4-6 hours as needed for cough.  disp qs for 1 month 15)  Tessalon Perles 100 Mg Caps (Benzonatate) .Marland Kitchen.. 1 every twice a day as neede for cough  Patient Instructions: 1)  Thank you for seeing me today. 2)  Take the cough syurip at night for cough that keeps you up. 3)  Try the tesselon twice a day for cough.  4)  If you have chest pain, difficulty breathing, fevers over 102 that does not get better with tylenol please call us or see a doctor.  5)  If you get worse or are not feeling better in a week or so please come back.  6)  See Dr. Nori Riis in 1 month to recheck your blood pressure. Prescriptions: TESSALON PERLES 100 MG CAPS (BENZONATATE) 1 every twice a day as neede for cough  #60 x 0   Entered and Authorized by:   Lynne Leader MD   Signed by:   Lynne Leader MD on 07/17/2010   Method used:   Print then Give to Patient   RxID:   [id number] HYDROCODONE-HOMATROPINE 5-1.5 MG/5ML SYRP (HYDROCODONE-HOMATROPINE) Take 1 teaspoon  every 4-6 hours as needed for cough.  Disp qs for 1 month  #1 x 0   Entered and Authorized by:   Lynne Leader MD   Signed by:   Lynne Leader MD on 07/17/2010   Method used:   Print then Give to Patient   RxID:   [id number]    Orders Added: 1)  [facility]- Est Level  3 [id number]

## 2010-08-14 NOTE — Miscellaneous (Signed)
  Clinical Lists Changes  Medications: Rx of VICODIN 5-500 MG TABS (HYDROCODONE-ACETAMINOPHEN) 1 by mouth two times a day as needed pain;  #60 x 2;  Signed;  Entered by: Dorcas Mcmurray MD;  Authorized by: Dorcas Mcmurray MD;  Method used: Telephoned to [facility] [phone]*, [address] [place], [place]  [address], Ph: [phone], Fax: [phone]   [place] please call this in Thanks!  Dorcas Mcmurray MD  August 06, 2010 2:40 PM   Prescriptions: VICODIN 5-500 MG TABS (HYDROCODONE-ACETAMINOPHEN) 1 by mouth two times a day as needed pain  #60 x 2   Entered and Authorized by:   Dorcas Mcmurray MD   Signed by:   Dorcas Mcmurray MD on 08/06/2010   Method used:   Telephoned to ...       [facility]  [address] [phone]* (retail)       [address]       [place], [place]  [address]       Ph: [phone]       Fax: [phone]   RxID:   [id number]  rx called in before pt left.Audelia Hives CMA  August 06, 2010 5:00 PM

## 2010-08-20 NOTE — Letter (Signed)
Summary: Generic Letter  [facility]  [address]   [place], [place] [address]   Phone: [phone]  Fax: [phone]    08/11/2010  [place],   [address]  Dear Mr. ORRICK,  All of your labs including electrolytes, kidney and liver function, glucose were normal. Your LDL cholesterol was perfect at 93.         Sincerely,   Dorcas Mcmurray MD  Appended Document: Generic Letter mailed

## 2010-08-27 ENCOUNTER — Other Ambulatory Visit: Payer: Self-pay

## 2010-08-27 ENCOUNTER — Ambulatory Visit (INDEPENDENT_AMBULATORY_CARE_PROVIDER_SITE_OTHER): Payer: Medicare Other | Admitting: Family Medicine

## 2010-08-27 ENCOUNTER — Ambulatory Visit (HOSPITAL_COMMUNITY)
Admission: RE | Admit: 2010-08-27 | Discharge: 2010-08-27 | Disposition: A | Discharge status: Home / Self Care | Payer: Medicare Other | Source: Ambulatory Visit | Attending: Cardiology | Admitting: Cardiology

## 2010-08-27 ENCOUNTER — Encounter: Payer: Self-pay | Admitting: Family Medicine

## 2010-08-27 VITALS — BP 140/74 | HR 84 | Temp 98.2°F | Wt 193.3 lb

## 2010-08-27 DIAGNOSIS — H833X9 Noise effects on inner ear, unspecified ear: Secondary | ICD-10-CM

## 2010-08-27 DIAGNOSIS — I10 Essential (primary) hypertension: Secondary | ICD-10-CM

## 2010-08-27 DIAGNOSIS — H6120 Impacted cerumen, unspecified ear: Secondary | ICD-10-CM

## 2010-08-27 DIAGNOSIS — K219 Gastro-esophageal reflux disease without esophagitis: Secondary | ICD-10-CM

## 2010-08-27 DIAGNOSIS — R079 Chest pain, unspecified: Secondary | ICD-10-CM

## 2010-08-27 DIAGNOSIS — I498 Other specified cardiac arrhythmias: Secondary | ICD-10-CM | POA: Insufficient documentation

## 2010-08-27 MED ORDER — ALUM & MAG HYDROXIDE-SIMETH 200-200-20 MG/5ML PO SUSP: 30.0000 mL | ORAL | Status: AC

## 2010-08-27 MED ORDER — AMLODIPINE BESYLATE 5 MG PO TABS: 5.0000 mg | ORAL_TABLET | Freq: Every day | ORAL | Status: DC

## 2010-08-27 NOTE — Progress Notes (Signed)
  Subjective:    Patient ID: [name], male    DOB: Jun 29, 1934, 77 y.o.   MRN: [id number]  HPI F/u change in htn meds--we decreased his hyzaar by half--has noted improvement in leg fatigue during exertion. It is not cramping or pain but fatigue. Has restarted 1 mile a day and has BP diary. Wants to decrease his amlodipine as his feet swell. Bargains that he will continue to exercise and record BP   Review of Systems  Constitutional: Negative for diaphoresis, appetite change and fatigue.  Respiratory: Negative for wheezing.   Cardiovascular: Positive for leg swelling. Negative for chest pain.  Neurological: Negative for dizziness and syncope.       Objective:   Physical Exam    GENERALl: Well developed, well nourished, in no acute distress. NECK: Supple, FROM, without lymphadenopathy.  THYROID: normal without nodularity CAROTID ARTERIES: without bruits LUNGS: clear to auscultation bilaterally. No wheezes or rales. HEART: Regular rate and rhythm, no murmurs ABDOMEN: soft with positive bowel sounds NEURO: No gross focal deficits R ear canal with significant cerumen      Assessment & Plan:

## 2010-08-28 ENCOUNTER — Encounter: Payer: Self-pay | Admitting: Family Medicine

## 2010-08-28 NOTE — Assessment & Plan Note (Signed)
Cerumen impaction removed right ear

## 2010-08-28 NOTE — Assessment & Plan Note (Signed)
Was having heart burn during visit---started this am--right sided--we gave him mylanta and did EKG which showed bradycardia but no AT changes. He wants to continue prn mylanta as he does not have this oftern. No assoc sx and it significantly improved w myl;anta here

## 2010-08-28 NOTE — Assessment & Plan Note (Signed)
Will continue hyzaar at lower dose. Reviewed his BP diary--his goal is < [id number] sytolic but with hi able to continue activity so we agreed to decrease amlodiopine to 5 mg, continue exercise and BP diary, rtc 2 m. Long discussion re exercise

## 2010-09-02 ENCOUNTER — Other Ambulatory Visit: Payer: Self-pay | Admitting: Family Medicine

## 2010-09-02 DIAGNOSIS — I10 Essential (primary) hypertension: Secondary | ICD-10-CM

## 2010-09-02 MED ORDER — AMLODIPINE BESYLATE 5 MG PO TABS: 5.0000 mg | ORAL_TABLET | Freq: Every day | ORAL | Status: DC

## 2010-09-30 ENCOUNTER — Encounter: Payer: Self-pay | Admitting: Family Medicine

## 2010-10-09 NOTE — Miscellaneous (Signed)
Clinical Lists Changes  Observations: Added new observation of DM PROGRESS: N/A (09/30/2010 16:34) Added new observation of DM FSREVIEW: N/A (09/30/2010 16:34)      Complete Medication List: 1)  Bayer Aspirin 325 Mg Tabs (Aspirin) .... Take 1 tablet by mouth once a day 2)  Klonopin 0.5 Mg Tabs (Clonazepam) .... Take 1 tablet by mouth at bedtime and 1/2 tab by mouth once daily as needed 3)  Lipitor 10 Mg Tabs (Atorvastatin calcium) .... Take 1 tablet by mouth once a day 4)  Norvasc 10 Mg Tabs (Amlodipine besylate) .Marland Kitchen.. 1 by mouth qd 5)  Omeprazole 20 Mg Cpdr (Omeprazole) .Marland Kitchen.. 1 by mouth qd 6)  Nitrolingual 0.4 Mg/spray Tl Soln (Nitroglycerin) .... One spray as needed exertional chest pain. 7)  Proventil Hfa 108 (90 Base) Mcg/act Aers (Albuterol sulfate) .... 2 p qid as needed 8)  Vicodin 5-500 Mg Tabs (Hydrocodone-acetaminophen) .Marland Kitchen.. 1 by mouth two times a day as needed pain 9)  Hyzaar 50-12.5 Mg Tabs (Losartan potassium-hctz) .Marland Kitchen.. 1 by mouth qd 10)  Cetirizine Hcl 10 Mg Tabs (Cetirizine hcl) .Marland Kitchen.. 1 by mouth once daily as needed allergies   Past History:  Past Medical History: Last updated: 01/08/2010 diverticulitis - recurrent,  dry eyes, mouth--seen by dr. Truman Hayward (ent),  tinnitus--chronic cardiologist Dr [name] - Cath 2008 showed EF 65% Min coronary artery irregularities Urology Dr Tresa Endo GI Dr Cristina Gong - Diverticulosis/Diverticulitis Dr Clent Jacks did his cea - carotid endarterectomy hi cholesterol AAA screen negative 12/2009  Past Surgical History: Last updated: 08/14/2009 cardiac Cath--60% lad, 75% ostial-smith - 09/12/2002,  carotid endart.--r. 1998 - 09/12/2002,  Cholecystectomy--rosenbower - 04/12/2006,  colonoscopy--diverticulosis - 09/11/2002,  Endoscopy--sliding hiatal hernia-buccini - 04/12/2006,  radical prostatectomy--1998 - 09/12/2002 lithotripsy with complications, subsequent ureteral stens 2010.  Family History: Last updated: December 28, 2009 2 sisters sudden  death-MI Mother 85-heart disease, thyroid disease Father 85-heart disease, stroke  Social History: Last updated: 10/14/2007 Lives with wife--recently diagnosed with breast ca; No smoking; Occassional wine; Retired; 2 daughters live in s.e--5 grandchildren(boys). On weight watchers. 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 No smoking x 25 years. No drugs.     Prevention & Chronic Care Immunizations   Influenza vaccine: Historical  (04/01/2010)   Influenza vaccine due: 03/13/2009    Tetanus booster: 09/25/2008: given   Tetanus booster due: 09/26/2018    Pneumococcal vaccine: given  (03/13/2005)   Pneumococcal vaccine due: None    H. zoster vaccine: 02/07/2010: Zostavax  Colorectal Screening   Hemoccult: had colonoscopy not indicated  (07/10/2008)   Hemoccult due: Not Indicated    Colonoscopy: Done.  (09/11/2002)   Colonoscopy due: 09/10/2012  Other Screening   PSA: is followed at urology w psa s/p prostate cancer  (09/25/2008)   PSA due due: Not Indicated   Smoking status: quit > 6 months  (08/06/2010)  Lipids   Total Cholesterol: 131  (09/27/2009)   Lipid panel action/deferral: LDL Direct Ordered   LDL: 60  (09/27/2009)   LDL Direct: 93  (08/06/2010)   HDL: 55  (09/27/2009)   Triglycerides: 81  (09/27/2009)    SGOT (AST): 19  (08/06/2010)   SGPT (ALT): 16  (08/06/2010)   Alkaline phosphatase: 56  (08/06/2010)   Total bilirubin: 2.2  (08/06/2010)  Hypertension   Last Blood Pressure: 165 / 79  (08/06/2010)   Serum creatinine: 1.03  (08/06/2010)   Serum potassium 4.1  (08/06/2010)  Self-Management Support :    Hypertension self-management support:  Not documented    Lipid self-management support: Not documented

## 2010-10-15 LAB — DIFFERENTIAL
Basophils Absolute: 0.1 10*3/uL (ref 0.0–0.1)
Eosinophils Absolute: 0.1 10*3/uL (ref 0.0–0.7)
Eosinophils Relative: 1 % (ref 0–5)
Lymphocytes Relative: 7 % — ABNORMAL LOW (ref 12–46)
Lymphs Abs: 0.8 10*3/uL (ref 0.7–4.0)
Neutrophils Relative %: 84 % — ABNORMAL HIGH (ref 43–77)

## 2010-10-15 LAB — COMPREHENSIVE METABOLIC PANEL
ALT: 18 U/L (ref 0–53)
AST: 32 U/L (ref 0–37)
CO2: 26 mEq/L (ref 19–32)
Calcium: 10 mg/dL (ref 8.4–10.5)
Chloride: 99 mEq/L (ref 96–112)
Creatinine, Ser: 1.06 mg/dL (ref 0.4–1.5)
GFR calc non Af Amer: >60 mL/min (ref >60)
Glucose, Bld: 122 mg/dL — ABNORMAL HIGH (ref 70–99)
Total Bilirubin: 2.9 mg/dL — ABNORMAL HIGH (ref 0.3–1.2)

## 2010-10-15 LAB — URINALYSIS, ROUTINE W REFLEX MICROSCOPIC
Bilirubin Urine: NEGATIVE
Bilirubin Urine: NEGATIVE
Glucose, UA: NEGATIVE mg/dL
Glucose, UA: NEGATIVE mg/dL
Ketones, ur: 15 mg/dL — AB
Ketones, ur: 40 mg/dL — AB
Nitrite: NEGATIVE
Protein, ur: NEGATIVE mg/dL
Protein, ur: NEGATIVE mg/dL
Specific Gravity, Urine: 1.008 (ref 1.005–1.030)
Urobilinogen, UA: 0.2 mg/dL (ref 0.0–1.0)
Urobilinogen, UA: 1 mg/dL (ref 0.0–1.0)
pH: 8 (ref 5.0–8.0)

## 2010-10-15 LAB — POCT I-STAT 4, (NA,K, GLUC, HGB,HCT)
HCT: 40 % (ref 39.0–52.0)
Sodium: 138 mEq/L (ref 135–145)

## 2010-10-15 LAB — CBC
HCT: 44.6 % (ref 39.0–52.0)
Hemoglobin: 15.6 g/dL (ref 13.0–17.0)
MCHC: 35 g/dL (ref 30.0–36.0)
MCV: 100.3 fL — ABNORMAL HIGH (ref 78.0–100.0)
RBC: 4.45 MIL/uL (ref 4.22–5.81)
WBC: 10.5 10*3/uL (ref 4.0–10.5)

## 2010-10-15 LAB — URINE MICROSCOPIC-ADD ON

## 2010-11-25 NOTE — H&P (Signed)
NAME:  [name], [name] NO.:  [id number]   MEDICAL RECORD NO.:  [id number]          PATIENT TYPE:  EMS   LOCATION:  ED                           FACILITY:  [facility]   PHYSICIAN:  Thayer Headings, M.D. DATE OF BIRTH:  05-27-1934   DATE OF ADMISSION:  01/12/2007  DATE OF DISCHARGE:                              HISTORY & PHYSICAL   HISTORY:  [name] is a 77 year old gentleman who is admitted with some  episodes of chest pain.   [name] has a history of diffuse distal coronary artery disease.  He  had a heart catheterization in 2001 by Dr. Daneen Schick, which revealed  mild to moderate diffuse disease.  He has done fairly well on medical  therapy and has not had any significant problems.   He has a history of peripheral vascular disease and is status post left  carotid endarterectomy.  He also has a history hyperlipidemia and  hypertension.   The patient has been in relatively good health.  He was seen in our  office several months ago.  He had not reported any problems at that  time.   The patient awoke with severe chest pressure this morning.  The pressure  was described as a midsternal heaviness.  It was dull in nature.  It  seemed to radiate around through to his back.  It was not relieved with  several of his home nitroglycerin but was eventually relieved here at  the hospital with IV and sublingual nitroglycerin.  We are asked to see  him for further evaluation.   The patient states that this pain was more severe than any pain that he  recalls.  It was associated with some shortness breath, and he was not  diaphoretic.   His current medications are:  1. Enteric-coated aspirin 325 mg a day.  2. Lipitor 10 mg a day.  3. Norvasc 5 mg a day.  4. GlycoLax once a day.  5. Omeprazole 20 mg a day.  6. Claritin once a day.   ALLERGIES:  SULFA MEDICATIONS.   PAST MEDICAL HISTORY:  1. History of coronary artery disease.  He has a 60-70% mid LAD  stenosis with a first diagonal stenosis of approximately 75%      stenosis.  2. History of peripheral vascular disease, status post carotid      endarterectomy.  3. Hypertension..  4. Hyperlipidemia.   SOCIAL HISTORY:  The patient has a remote history of cigarette smoking.   FAMILY HISTORY:  Positive for myocardial infarction in both his mom and  his dad.   The review of systems is reviewed and is essentially negative except for  as noted in the HPI.   On exam, he is an elderly gentleman in no acute distress.  He is alert  and oriented x3 and his mood and affect are normal.  Blood pressure is 108/47 with heart rate of 64.  HEENT:  2+ carotids.  He has no bruits, no JVD, no thyromegaly.  LUNGS:  Clear to auscultation.  HEART:  Regular rate, S1-S2.  Abdominal  exam reveals good bowel sounds and is nontender.  EXTREMITIES:  He has no clubbing, cyanosis or edema.  Neurologic exam is nonfocal.   Initial cardiac markers are negative.   His CBC and his Chem-7 are normal.  His EKG reveals normal sinus rhythm.  He has no ST or T-wave changes.   IMPRESSION AND PLAN:  Chest pain.  The patient has had moderate coronary  artery disease by heart catheterization 7 years ago.Marland Kitchen  He now presents  with some symptoms that are very worrisome.  The ER has also done a  chest CT, which was negative for pulmonary emboli.  I would like to  admit him.  We will transfer him to [facility] and will plan on doing  a heart catheterization tomorrow.  We will continue with a nitroglycerin  drip and will also had some heparin.   All of his other medical problems remain stable.  We will check a  fasting lipid profile tomorrow.           ______________________________  Thayer Headings, M.D.     PJN/MEDQ  D:  01/12/2007  T:  01/12/2007  Job:  [id number]   cc:   [place]

## 2010-11-25 NOTE — Cardiovascular Report (Signed)
NAMEANSELMO, STEGER NO.:  [id number]   MEDICAL RECORD NO.:  [id number]          PATIENT TYPE:  INP   LOCATION:  4708                         FACILITY:  [facility]   PHYSICIAN:  Thayer Headings, M.D. DATE OF BIRTH:  1934-05-29   DATE OF PROCEDURE:  01/13/2007  DATE OF DISCHARGE:  01/13/2007                            CARDIAC CATHETERIZATION   HISTORY OF PRESENT ILLNESS:  Duffie Hampson is a 77 year old gentleman with  a history of mild to moderate coronary artery disease.  He is admitted  with severe episodes of chest pain.  He is referred for heart  catheterization for further evaluation.   The procedure was left heart catheterization with coronary angiography.   The right femoral artery was easily cannulated using modified Seldinger  technique.   HEMODYNAMIC RESULTS:  LV pressure is 119/7 with an aortic pressure of  120/58.   ANGIOGRAPHY:  Left main: The left main has minor luminal irregularities.   The left anterior descending artery has minor luminal irregularities.  The mid vessel has some diffuse irregularities.  The distal aspect of  the LAD has a 40% focal lesion at the bifurcation of the LAD and a  diagonal vessel.  The stenosis is located right at the bifurcation and  is really not a good site for angioplasty.  In addition, it does not  appear to be flow obstructive.   Left circumflex artery is a fairly large system.  It gives off a large  first obtuse marginal branch.  There are minor luminal irregularities.  There is a 20-30% stenosis in the proximal aspect of the first OM.   The right coronary artery is large and dominant.  There is a proximal  30% stenosis.  There is diffuse irregularities in the mid and distal  region.  The posterior descending artery has minor luminal  irregularities, and the posterolateral branches are unremarkable.   Left ventriculogram was performed in the 30 RAO position.  It reveals  overall normal left ventricular systolic  function.  Ejection fraction is  approximately 65%.   COMPLICATIONS:  None.   CONCLUSION:  Minimal coronary artery irregularities.  I do not think  that his episodes of rest chest pain are due to coronary artery disease.  He is stable leaving cath lab.  We anticipate discharge tonight.           ______________________________  Thayer Headings, M.D.     PJN/MEDQ  D:  01/13/2007  T:  01/14/2007  Job:  [id number]

## 2010-11-25 NOTE — Consult Note (Signed)
[name], [name] NO.:  [id number]   MEDICAL RECORD NO.:  [id number]          PATIENT TYPE:  EMS   LOCATION:  MAJO                         FACILITY:  [facility]   PHYSICIAN:  Blane Ohara McDiarmid, M.D.DATE OF BIRTH:  1934-01-12   DATE OF CONSULTATION:  10/16/2007  DATE OF DISCHARGE:  10/14/2007                                 CONSULTATION   CHIEF COMPLAINT:  Nausea.   HISTORY OF PRESENT ILLNESS:  The patient is a 77 year old male with  history of hyperlipidemia, tobacco abuse, GERD, recurrent diverticulitis  with nausea and vomiting, fever and hypotension.  The patient ate a lot  of fruit yesterday during the day more than usual, and around 8:00 to  9:00 p.m. had an irritable stomach, had restless sleep as well. Around  three to four o'clock in the morning, he woke with nausea and vomited in  the bathroom and then became lightheaded.  He lowered himself slowly  down to the floor, denied any falls.  Denies any loss of consciousness.  He did feel weak and had trouble walking because of feeling poorly and  lightheaded. His wife called EMS.  The patient vomited three more times,  the last time was this morning around five o'clock a.m.  Of note, his  wife also became nauseous here in the ED.  Abdominal film and CT of his  abdomen and pelvis were negative for diverticulitis and other findings.  Denies bright red blood per rectum.  Denies melena, denies diarrhea, no  constipation. Does say he had a little bit of chest pain today with a  small amount of epigastric pain at one point, but all his pain that he  describes is below sternum.  He does state he has anginal pain when he  exercises; however, his recent catheterization showed minimal coronary  artery disease.  He does have some mild shortness of breath. Now he just  feels hot and has a headache.  His blood pressure is much better at  130/51 after IV fluids.  He denies dysuria.  Denies hematuria, but he  does feel that  he will be able to go home. Blood pressure in the  emergency department had been 80s to 120s over 40s to 60s.  He was given  Zofran x2, IV fluids, and Unasyn x1 in the ED.   MEDICATIONS:  1. Aspirin 325 mg  2. Klonopin 0.5 mg, take 1/4 tablet p.o. nightly.  3. Lipitor 10 mg daily.  4. Norvasc 5 mg daily.  5. Prilosec 20 mg daily.  6. Polyethylene glycol powder 1 packet daily.   PAST MEDICAL HISTORY:  1. Recurrent diverticulitis.  2. History of cholecystectomy.  3. Chronic tinnitus.  4. Catheterization in 2008 showed an EF of 65% and minimal coronary      artery irregularities.  5. History of prostatectomy by Dr. Rosana Hoes.  6. History of carotid endarterectomy by Dr. Berdine Addison.  7. Hypertension, though he says he never had this, so am a little      unclear as to why he is on the Norvasc.  8. Hyperlipidemia.  9. Sliding hiatal hernia per Dr. Cristina Gong in 2007 via endoscopy.  10.He  had a colonoscopy as well that showed diverticulosis.   SOCIAL HISTORY:  Lives with wife who was recently diagnosed with breast  cancer.  Denies any smoking currently but did smoke 2 packs a day for 32  years.  He has not smoked for 25 years.  Denies drugs. Occasional  alcohol.   FAMILY HISTORY:  Dad with a CVA, mom with thyroid problems.   PHYSICAL EXAMINATION:  GENERAL:  He is alert and a little bit drowsy.  HEENT:  Pupils equal, round, reactive to light.  Extraocular muscles  intact.  No icterus. Normocephalic, atraumatic. Mouth, pharynx pink and  moist without erythema. Dry mucous membranes, however.  NECK:  No bruits, supple.  No lymphadenopathy.  LUNGS:  Normal respiratory effort.  Chest expands symmetrically.  Lungs  are clear to auscultation.  No crackles or wheezes.  HEART:  Regular rate and rhythm.  No murmurs, rubs or gallops.  S2 and  S1 normal.  ABDOMEN:  Soft, nondistended.  Normoactive bowel sounds.  No masses, no  guarding, no rigidity.  No hepatosplenomegaly.  MUSCULOSKELETAL:  Strength 5/5  bilaterally, able to sit up well without  issues.  EXTREMITIES:  Pulses right and left radial 2+, right left dorsalis pedis  normal. Trace edema bilaterally.  NEUROLOGIC:  Alert and oriented x3.  SKIN:  Turgor normal. Good capillary refill   LABORATORY DATA:  White blood cell count 12.8, hemoglobin 14.9,  hematocrit 42.5, platelets 150.  Neutrophils 90%.  CMET:  Sodium 141,  potassium 4.1, chloride 103, bicarb 29, BUN 12, creatinine 1.09; it was  1.08 in the clinic June 2008. Calcium 8.9, total bilirubin 2.3; this was  2.2 in June 2008 in the clinic. AST 23, ALT 23, Alkaline phosphatase 59.  Lipase 17.  UA negative, nitrate negative, leukocyte esterase, specific  gravity 1.017, ketones 15. Point-of-care cardiac enzymes negative x1.   Abdominal film showing no obstruction or free air.   CT of abdomen and pelvis:  A right renal pole stone, nonobstructing,  extensive diverticulosis.  No diverticulitis.   EKG:  Normal sinus rhythm, split T and aVL and V1 but only V1 new,  normal axis, 80 beats per minute.   ASSESSMENT AND PLAN:  The patient is 77 year old male with a history of  diverticulitis, diverticulosis, hyperlipidemia, prostate cancer,  cholecystectomy, here with nausea and vomiting likely due to  gastroenteritis.  1. Gastroenteritis appears to be viral as has a fever, nausea,      vomiting. Wife also now with nausea.  Could also have been from      something he ate yesterday given all the food intake.  The CT is      negative for diverticulitis.  No evidence of obstruction. Currently      is walking well as taking p.o. well without vomiting.  Will      discharge home. Discussed with Dr. McDiarmid.  He will follow up      Wednesday, April 8, at 10:30 a.m. with Dr. Nori Riis.  He may take      Tylenol for fever.  He was given a prescription for Zofran to take      as needed prior to leaving the hospital. Discharge order was      written.  2. Dehydration.  The patient was given a  bolus as well as IV fluids      normal saline in the ED. He was able to  take      p.o. intake at time of discharge.  Will push p.o. when he leaves  3. Hypertension.  This was improved after IV fluids were given, likely      due to dehydration.  Point-of-care enzymes were negative, and EKG      was not concerning. Likely secondary to an acute illness and      dehydration.      Rolly Salter, M.D.  Electronically Signed      Blane Ohara McDiarmid, M.D.  Electronically Signed    JH/MEDQ  D:  10/16/2007  T:  10/16/2007  Job:  [id number]

## 2010-11-25 NOTE — Discharge Summary (Signed)
[name], [name] NO.:  [id number]   MEDICAL RECORD NO.:  [id number]          PATIENT TYPE:  INP   LOCATION:  4708                         FACILITY:  [facility]   PHYSICIAN:  Thayer Headings, M.D. DATE OF BIRTH:  10-17-1933   DATE OF ADMISSION:  01/12/2007  DATE OF DISCHARGE:  01/13/2007                               DISCHARGE SUMMARY   DISCHARGE DIAGNOSES:  1. Noncardiac chest pain.  2. History of mild coronary artery irregularities.   DISCHARGE MEDICATIONS:  1. Lipitor 10 mg.  2. Aspirin 325 mg a day.  3. Over-the-counter Prilosec 20 mg a day.  4. Nitroglycerin as needed.  5. Claritin 10 mg a day.   DISPOSITION:  The patient will see Dr. Acie Fredrickson in the office in several  weeks.   HISTORY OF PRESENT ILLNESS:  [name] is a 77 year old gentleman with a  history of mild to moderate coronary artery disease.  He is admitted to  [facility] for a severe episode of chest pressure that was  relieved eventually with nitroglycerin.  Please see dictated H&P for  further details.   HOSPITAL COURSE:  The patient ruled out for myocardial infarction.  He  was started on heparin and Integrilin and did not have any further  episodes of chest pain.  He underwent heart catheterization the  following morning.  He was found to have mild to moderate  irregularities.  There were no lesions that were thought to be flow  obstructing.  He is now discharged in satisfactory condition.  We will  add over-the-counter Prilosec to his daily medications.   FOLLOW UP:  I will see him in the office in several weeks for a followup  visit.  I have asked him to call me sooner if he has any problems.  All  of his other medical problems remain fairly stable.           ______________________________  Thayer Headings, M.D.     PJN/MEDQ  D:  01/13/2007  T:  01/14/2007  Job:  [id number]

## 2010-11-28 NOTE — Discharge Summary (Signed)
[place]. [facility]  Patient:    [name], [name]                    MRN: [id number] Adm. Date:  [id number] Disc. Date: 09/09/99 Attending:  Angelica Pou CC:         Illene Labrador, M.D.             Lanice Shirts, M.D.             Eber Hong Geni Bers, M.D.                           Discharge Summary  DATE OF BIRTH:  April 26, 1934  CONSULTATIONS:  Illene Labrador, M.D.  PROCEDURE:  Cardiac catheterization.  DISCHARGE DIAGNOSES: 1. Mild to moderate nonocclusive atherosclerotic coronary artery disease (20% left    main, 60% to 70% left anterior descending after the first diagonal, 75% ostial    lesion of the obtuse marginal #2). 2. Atypical chest pain. 3. Gastroesophageal reflux disease. 4. History of hypertension. 5. History of hyperlipidemia. 6. History of atherosclerotic peripheral vascular disease status post left carotid    endarterectomy in 1998; claudication by patient symptoms (ankle brachial indices    this admission normal). 7. History of transurethral resection of the prostate in 1998. 8. History of remote tobacco (quit 15 years ago).  DISCHARGE MEDICATIONS:  Nitroglycerin sublingual p.r.n.  Enteric coated aspirin 325 mg p.o. q.d.   Atenolol 25 mg p.o. q.d.  Lipitor 10 mg p.o. q.d.  Amitriptyline 10 mg p.o. q.h.s. x 2 weeks, then 20 mg p.o. q.h.s. with titration thereafter guided by response.  HISTORY:  the patient is a 77 year old man who has experience exertional chest ain intermittently for some time but, lately, experiencing episodes at rest.  He typically describes chest tightness when walking or doing yard work, mild and relieved with rest.  Six days prior to admission, he walked without difficulty ut, several hours later, developed chest tightness at rest with left arm symptoms. He also felt extremely cold, but experience no diaphoresis or nausea.  The pain was not relieved with Mylanta or  aspirin, and gradually wore off.  He has had similar symptoms on and off with an without exertion since then.  He describes claudication of the legs as well as occasional postprandial abdominal pain.  He can definitely distinguish between his exertional chest pain and that  experienced with what he associates with gastroesophageal reflux disease.  PHYSICAL EXAMINATION:  VITAL SIGNS:  Normal temperature.  Blood pressure initially 170/100, resolving o 160/63.  Pulse in the 40-50 sinus bradycardia.  Saturation 99% on room air.  GENERAL:  He was nervous and talkative, pleasant and in no discomfort, quite a wandering historian.  The physical examination was largely unremarkable.  HEART:  No murmurs, rubs, or gallops.  LUNGS:  Clear.  ABDOMEN:  Benign with no epigastric or right upper quadrant tenderness.  EXTREMITIES:  He did have hairless lower 1/3 of his legs and feet and very reduced dorsalis pedis pulses bilaterally.  His feet were cool and pink with no visible  reason.  LABORATORY DATA:  EKG revealed sinus bradycardia and nonspecific ST changes. Chest x-ray was clear.  Amount labs were unremarkable including normal cardiac enzymes.  PLAN:  The patient was admitted to the telemetry unit with symptoms worrisome for unstable angina pectoris, although with a history of gastroesophageal reflux disease and some description  of his pain sounding GI.  HOSPITAL COURSE:  The patient was continued on his Atenolol and aspirin and nitroglycerin paste was added.  His proton pump inhibitor was continued.  He was pain free from arrival until very early morning hours the date of his procedure. His pain at that time resolved with Mylanta nearly immediately, but recurred approximately 20-30 minutes later.  The patient, under consultation by Dr. Pernell Dupre, underwent cardiac catheterization which revealed findings as above.  These findings were discussed with Dr. Tamala Julian and the  patient.  It is difficult to know if his symptoms are attributable to the degree of lesions identified.  Note that the lesions were not in a place amenable to intervention, and medical therapy was felt to be the best approach.  The patient reports that his most recent LDL was less than 00, so it appears his lipids are under good control if this is, indeed, so. Because he has experienced exertional symptoms, he will receive a stress Cardiolite evaluation in two days on an outpatient basis at Dr. Darliss Ridgel office.  The patient has been actively following with a gastroenterologist in [facility]. He has undergone an EGD and flexible sigmoidoscopy, which were apparently unremarkable, though he has a history of a hiatal hernia.  manometry is scheduled for next week, after which his gastroenterologist will call him with the results and guide further therapy.  Reglan has been tried without benefit.  The patient may be experiencing some degree of esophageal spasm.  There is a subset of individuals with atypical chest pain, more common in middle aged men who have a muscular component which has, in some cases, been shown to benefit from smooth muscle relaxers (nitroglycerin, calcium channel blockers, beta blockers) in addition to antidepressants such as amitriptyline.  We will begin amitriptyline at a low dose and monitor for side effects and benefits.  THis can be guided further in the office.  The patient is currently pain free and will be discharged in stable condition. DD:  09/09/99 TD:  09/09/99 Job: [id number]

## 2010-11-28 NOTE — Cardiovascular Report (Signed)
[place]. [facility]  Patient:    [name], [name]                    MRN: [id number] Proc. Date: 09/09/99 Adm. Date:  [id number] Attending:  Angelica Pou CC:         Cardiac Catheterization Laboratory             Angelica Pou, M.D.             Dorothyann Gibbs, M.D.                        Cardiac Catheterization  CINE:  #[id number]  PROCEDURE: 1. Left heart catheterization. 2. Selective coronary angiography. 3. Left ventriculography.  CARDIOLOGIST:  Farrel Gordon, M.D.  INDICATIONS:  Chest discomfort in a patient with cerebrovascular disease and a previous cardiac evaluations that were questionable for the evidence of ischemia.  DESCRIPTION OF PROCEDURE:  After informed consent, a 6-French sheath was inserted into the right femoral artery using the modified Seldinger technique.  A 6-French A2 multipurpose catheter was used for a left ventriculography, and selective right coronary angiography.  A #4 left Judkins catheter was used for a left coronary angiography.  The patient tolerated the procedure without complications.  RESULTS: HEMODYNAMIC DATA: Aortic pressure:  100/65. Left ventricular pressure:  100/13.  LEFT VENTRICULOGRAPHY:  The left ventricle was of normal size and demonstrates normal contractility.  SELECTIVE CORONARY ANGIOGRAPHY:  The proximal left coronary artery and the mid nd distal right coronary artery are calcified. 1. Left main coronary artery:  Very short, with ostial 25% narrowing. 2. Left anterior descending coronary artery:  This is a large vessel that    wraps around the left ventricular apex.  It gives origin to two diagonal    branches.  After the second diagonal branch there is a 50%-70% stenosis.    The diagonal contains perhaps 50% narrowing.  No high-grade obstruction is    noted. 3. Circumflex coronary artery:  The circumflex gives origin to three obtuse    marginal branches.  The second  obtuse marginal is a dominant vessel and contains    an eccentric ostial 70% stenosis.  The remainder of the circumflex is free    of any significant obstruction. 4. Right coronary artery:  The right coronary artery contains luminal    irregularities with mid-vessel 40%-50% narrowing.  A large PDA and left    ventricular branch arises distally. No significant obstruction is seen.    There is also proximal irregularity of up to 50%.  CONCLUSIONS: 1. Mild to moderate coronary artery disease involving the right coronary    artery, the obtuse marginal of the circumflex, and the mid left anterior    descending coronary artery.  No high-grade obstruction is noted.  The    obstruction in the left anterior descending coronary artery and obtuse    marginal are at branch points, and are not ideal for intervention. 2. Normal left ventricular function.  RECOMMENDATIONS:  Medical therapy, and if protracted symptoms, would recommend myocardial perfusion study to rule out evidence of ischemia.  If ischemia is documented, consider intervention in the appropriate territory.  Aggressive risk factor modification. DD:  09/09/99 TD:  09/09/99 Job: [id number]

## 2010-11-28 NOTE — Op Note (Signed)
NAMECLIFTON, [name] NO.:  [id number]   MEDICAL RECORD NO.:  [id number]          PATIENT TYPE:  AMB   LOCATION:  ENDO                         FACILITY:  [facility]   PHYSICIAN:  [name], M.D.   DATE OF BIRTH:  February 16, 1934   DATE OF PROCEDURE:  09/02/2004  DATE OF DISCHARGE:                                 OPERATIVE REPORT   PROCEDURE:  Colonoscopy.   INDICATIONS FOR PROCEDURE:  Abnormal radiographic appearance of the colon on  CT scan in a patient with a change in bowel habits towards constipation.  Colonoscopy by me approximately two years ago had shown some evidence of  diverticular change and mucosal prolapse in the sigmoid region.  On the  current CT scan, there was some asymmetrical thickening of the sigmoid  region.   FINDINGS:  Sigmoid diverticulosis with muscular thickening.  No evidence of  neoplasia.   DESCRIPTION OF PROCEDURE:  The nature, purpose, and risks of the procedure  were familiar to the patient from prior examination, and he provided written  consent.  Sedation was with Fentanyl 75 mcg and Versed 8 mg IV without  arrhythmias or desaturation.  Digital exam showed a surgically absent  prostate gland.   The Olympus adjustable tension pediatric videocolonoscope was advanced to  the cecum as identified by clear visualization of the appendiceal orifice,  turning the patient into the supine position to facilitate advancement of  the scope into the base of the cecum.  Pullback was then performed.  The  quality of the prep was very good, and it was felt that all areas were well-  seen.   In the sigmoid region, there was a fair amount of muscular thickening  (myochosis) with associated diverticular disease, but I saw no evidence of  mucosal prolapse or other mucosal abnormalities at this time.  In  particular, no evidence of polyps, cancer, or colitis.  I reinspected the  entire sigmoid region, without any additional findings.  The remainder  of  the colon was unremarkable.  Nowhere on this exam were any polyps, cancer,  vascular malformations, or colitis observed.  Retroflexion in the rectum  showed internal hemorrhoids.   No biopsies were obtained.  The patient tolerated the procedure well, and  there were no apparent complications.   IMPRESSION:  1.  Abnormal radiographic appearance of the sigmoid colon on recent computed      tomography scan, presumably      related to endoscopically-observed muscular thickening.  (793.4)  2.  Sigmoid diverticulosis.   PLAN:  Consider flexible sigmoidoscopy in five years for continued  screening.      RB/MEDQ  D:  09/02/2004  T:  09/02/2004  Job:  [id number]   cc:   Verner Chol, MD  [place]  [place] [address]  Fax: [phone]

## 2010-11-28 NOTE — Op Note (Signed)
NAME:  [name], [name]                       ACCOUNT NO.:  [id number]   MEDICAL RECORD NO.:  [id number]                   PATIENT TYPE:  AMB   LOCATION:  ENDO                                 FACILITY:  [facility]   PHYSICIAN:  [name], M.D.                DATE OF BIRTH:  01/10/1934   DATE OF PROCEDURE:  12/06/2002  DATE OF DISCHARGE:                                 OPERATIVE REPORT   PROCEDURE:  Upper endoscopy with biopsies.   INDICATIONS:  A 77 year old gentleman with longstanding reflux symptoms and  more recent symptoms of dyspepsia which have responded fairly well to the  use of sucralfate.  In the meantime, the patient had one out of three  hemoccult cards come back positive.   FINDINGS:  Normal exam other than distal esophageal ring.   CONSENT:  The nature, purpose and risks of this procedure were familiar to  the patient from prior examination.  He provided consent.   SEDATION:  Fentanyl 75 mcg and Versed 6 mg IV without arrhythmias or  desaturation.   DESCRIPTION OF PROCEDURE:  The Olympus small caliber adult video endoscope  was passed under direct vision.  The larynx had a little bit thickening of  the posterior commissure raising some question of some possible mild reflux  laryngitis but the vocal cords themselves looked essentially normal.  The  esophagus was entered without difficulty and had entirely normal mucosa all  the way down to the squamocolumnar junction which was located at or just  above the region of the diaphragmatic hiatus, so no significant hiatal  hernia was present and I did not see any free reflux, reflux esophagitis, or  Barrett's esophagus.  No varices, infection or neoplasia were observed.  There appeared to be a muscular ring about 2 cm above the squamocolumnar  junction although this ring would intermittently relax or flatten out.  There was no evidence of a stricture.  The ring was located at about 40 cm  from the mouth and the  squamocolumnar junction at about 42 cm from the  mouth.   The stomach contained no significant residual and had normal mucosa without  evidence of gastritis, erosions, ulcers, polyps or masses including a  retroflexed view of the proximal stomach and the pylorus, duodenal bulb and  second duodenum were unremarkable in appearance.  Random mucosal biopsies  from the duodenum and antrum of the stomach were obtained prior to removal  of the scope.  The patient tolerated the procedure well and there were no  apparent complications.   IMPRESSION:  Unremarkable endoscopy.  No source of dyspeptic symptoms  endoscopically evident.  No source of heme positive stool identified.  No  adverse sequelae of reflux endoscopically evident.   PLAN:  Continue proton pump inhibitor therapy with Protonix or Prevacid for  ongoing symptom control.  Proceed to colonoscopic evaluation.  Await  pathology results.  [name], M.D.    RB/MEDQ  D:  12/06/2002  T:  12/06/2002  Job:  [id number]

## 2010-11-28 NOTE — Op Note (Signed)
NAME:  [name], [name]                       ACCOUNT NO.:  [id number]   MEDICAL RECORD NO.:  [id number]                   PATIENT TYPE:  AMB   LOCATION:  ENDO                                 FACILITY:  [facility]   PHYSICIAN:  [name], M.D.                DATE OF BIRTH:  [birth year]   DATE OF PROCEDURE:  12/06/2002  DATE OF DISCHARGE:                                 OPERATIVE REPORT   PROCEDURE:  Colonoscopy.   INDICATIONS:  Heme-positive on one out of three Hemoccults in a 77 year old  gentleman.   FINDINGS:  Left-sided diverticulosis.  Minimal mucosal prolapse syndrome  with mild intramucosal hemorrhages in the sigmoid region.   DESCRIPTION OF PROCEDURE:  The patient provided written consent for the  procedure.  Sedation for this procedure and the upper endoscopy which  preceded it totalled fentanyl 100 mcg and Versed 9 mg IV without arrhythmias  or desaturation.  Digital exam showed a surgically absent prostate gland.  The Olympus adjustable-tension pediatric video colonoscope was advanced  through a somewhat thickened and slightly narrowed sigmoid region with a lot  of diverticular disease, mycosis, and some intramucosal hemorrhages  consistent with mucosal prolapse syndrome, then fairly easily around the  proximal colon, whereupon we had to turn the patient into the supine  position and apply external abdominal compression to reach the base of the  cecum, which was identified by clear visualization of the appendiceal  orifice.  Pullback was then performed.  The quality of the prep was  excellent, and it is felt that all areas were well-seen.   There was moderate left-side diverticular disease and, in the sigmoid  region, some slight erythema consistent with intramucosal hemorrhage as  might be seen from mucosal prolapse.  I did not see any colitis, erosions,  ulcerations, polyps, masses, or vascular ectasis.  Retroflexion in the  rectum and reinspection of the  rectosigmoid was otherwise unremarkable.  No  biopsies were obtained.  The patient tolerated the procedure well, and there  were no apparent complications.   IMPRESSION:  1. No source of heme-positive stool evident.  2. Left-side diverticulosis.  3. Intramucosal hemorrhage, very mild, consistent with mucosal prolapse as     is sometimes seen in the sigmoid region when there is a lot of muscular     thickening.    PLAN:  Consider screening flexible sigmoidoscopy in five years for ongoing  colon cancer screening.  No further lower GI tract evaluation needed.                                               [name], M.D.    RB/MEDQ  D:  12/06/2002  T:  12/06/2002  Job:  [id number]   cc:   Haywood Pao, M.D.  [address]  [place]  [place] [address]  Fax: [phone]

## 2010-11-28 NOTE — Consult Note (Signed)
[facility]  Patient:    [name], [name]                    MRN: [id number] Proc. Date: 09/08/99 Adm. Date:  [id number] Attending:  Angelica Pou Dictator:   Farrel Gordon, M.D. CC:         Lanice Shirts, M.D.                          Consultation Report  DATE OF BIRTH:  [birth year]  CONCLUSIONS: 1.  Chest discomfort with typical and atypical features.     a.  Previous cardiac evaluations without gross abnormality by report. 2.  History of peripheral vascular disease.     a.  Status post left carotid endarterectomy in 1998. 3.  Hypertension. 4.  Hyperlipidemia. 5.  History of prostatectomy in 1998.  RECOMMENDATIONS: 1.  Rule out myocardial infarction with enzymes and EKGs. 2.  IV nitroglycerin if recurrent chest discomfort. 3.  Selective coronary angiography to define coronary anatomy and rule out     the presence of significant obstructive disease. 4.  Continue antihypertensive therapy with Atenolol.  Also continue anti-lipid     therapy with Lipitor.  COMMENTS:  The patient is age 77 and has a long-standing history of chest discomfort with evaluations previously by Dr. Atilano Median in [place], [place] and also at [facility] in [place] prior to prostatectomy.  The  patient gives a history of tightness in the chest on exertion, especially when e walks up inclines.  This has been occurring for several years.  A stress test by Dr. Atilano Median and also a nuclear stress test at [facility] must not have revealed significant abnormality because no further evaluation was done.  He had a transurethral prostatectomy following the stress test at [facility] and had no cardiac complications.  Over the past week the patient has had recurring episodes of exertional and rest substernal tightness that radiates into the left arm.  There are other features  that are atypical such as the prolonged duration of the arm  discomfort.  There as no associated shortness of breath, diaphoresis, or other cardiovascular complaints.  HABITS:  The patient is a prior smoker and discontinued smoking 15 years ago. e denies ethanol consumption.  MEDICATIONS:  Lipitor, aspirin, Atenolol, and Prilosec.  ALLERGIES:  None.  FAMILY HISTORY:  Positive for coronary artery disease.  Mother had myocardial infarction in her 65s.  Father, myocardial infarction in his 63s.  REVIEW OF SYSTEMS:  Unremarkable.  PHYSICAL EXAMINATION:  GENERAL:  The patient is in no distress.  VITAL SIGNS:  Blood pressure 128/70, heart rate 68.  SKIN:  Clear.  HEENT:  Unremarkable.  NECK:  No JVD, carotid bruits, or thyromegaly.  LUNGS:  Clear.  CARDIAC:  Normal.  EXTREMITIES:  No edema.  Femoral pulses are 2+ and symmetric.  NEUROLOGIC:  Normal.  LABORATORY DATA:  EKG is normal.  CK-MB is 61/0.5.  Troponin I 0.03.  BUN 14, creatinine 1.2.  Chest x-ray shows no acute disease.  DD:  09/08/99 TD:  09/09/99 Job: [id number]

## 2010-11-28 NOTE — Op Note (Signed)
NAMECIERRA, [name]                  ACCOUNT NO.:  [id number]   MEDICAL RECORD NO.:  [id number]          PATIENT TYPE:  AMB   LOCATION:  SDS                          FACILITY:  [facility]   PHYSICIAN:  Odis Hollingshead, M.D.DATE OF BIRTH:  04/14/1934   DATE OF PROCEDURE:  01/19/2006  DATE OF DISCHARGE:                                 OPERATIVE REPORT   PREOPERATIVE DIAGNOSIS:  Symptomatic cholelithiasis.   POSTOPERATIVE DIAGNOSIS:  Symptomatic cholelithiasis.   PROCEDURE:  Laparoscopic cholecystectomy with intraoperative cholangiogram.   SURGEON:  Odis Hollingshead, M.D.   ASSISTANT:  Rudell Cobb. Annamaria Boots, M.D.   ANESTHESIA:  General.   INDICATIONS:  This 77 year old male has been having intermittent right upper  quadrant pain, specifically following a fatty meal and symptoms consistent  with biliary colic.  Ultrasound demonstrated some gallbladder sludge and  likely small gallstones but no biliary dilatation.  Mild fatty infiltration  of liver was noted.  He now presents for laparoscopic cholecystectomy.  Preoperatively, his bilirubin was noted to be about 2.2.  No other liver  function tests are normal.  We discussed the procedure and risks well.   TECHNIQUE:  He was seen in the holding area and brought to the operating  room, placed on the operating table and general anesthetic was administered.  Hair on the abdominal wall was clipped, and the area was sterilely prepped  and draped.  Dilute Marcaine solution was infiltrated in the subumbilical  region, and a subumbilical incision was made through the skin, subcutaneous  tissue and fascia.  The peritoneal cavity was entered directly under vision.  A pursestring suture of 0 Vicryl placed around the fascial edges.  A Hassan  trocar was introduced into the peritoneal cavity.  Pneumoperitoneum was  created by insufflation of CO2 gas.   Next, a laparoscope was introduced.  Some fatty infiltration Pfannenstiel  the liver was noted.  He  was placed in reversed Trendelenburg position, the  right side tilted slightly up.  An 11-mm trocar was then placed through an  epigastric incision and two 5-mm trocars placed in the right upper quadrant.  The fundus of gallbladder was grasped, and there were noted to be adhesions  between the gallbladder body and omentum and duodenum which were taken down  bluntly and sharply staying on the gallbladder.  The fundus of gallbladder  was then able to be retracted to the right shoulder.  Using blunt  dissection, I immobilized infundibulum.  I identified the cystic duct and  created a window around it.  There was small blood vessel going from the  anterior branch of the cystic artery to the cystic duct which I clipped and  divided, enlarging the window.  I subsequently made a window around the  anterior branch of the cystic artery, clipped it and divided it.  A clip was  placed at the cystic duct gallbladder junction and a small incision made in  the cystic duct and some thick bile milked out of it.  A cholangiocatheter  was passed through the anterior abdominal wall, placed into the cystic duct  and a cholangiogram performed.   Under real time fluoroscopy, dilute contrast material was injected into the  cystic duct which was moderate to long length.  Mild to moderate contrast  extravasated from where the cholangiocath went into the cystic duct.  The  biliary system was outlined, and the common bile duct did not showing any  obvious filling defects noted of the hepatic ducts.  This was confirmed by  preliminary report from the radiologist.   Following this, the cholangiocath was removed, cystic duct was clipped 3  times proximally and divided.  Posterior branch of the cystic artery was  identified and a window created around.  It was clipped and divided.  The  gallbladder was dissected free from the liver using electrocautery.  A small  puncture wound was made in the gallbladder, and there  was a minimal amount  of bile leakage; no stones were leaked.  The gallbladder was then placed in  the Endopouch bag.   Following this, I irrigated out the gallbladder fossa copiously with saline  solution, and bleeding points were controlled with electrocautery.  Further  inspection demonstrated that the hemostasis was adequate, and there was no  bile leak.  I evacuated as much fluid as possible around the perihepatic  area and irrigated until it was clear.  The gallbladder was then removed in  the bag through the subumbilical port.  The subumbilical fascial defect was  closed by tightening up and tying down the pursestring suture under  laparoscopic vision.  The remaining trocars were removed, and  pneumoperitoneum was released.   Skin incisions were closed with 4-0 Monocryl subcuticular stitches followed  by Steri-Strips and sterile dressings.  He tolerated the procedure without  any apparent complications and was taken to recovery in satisfactory  condition.      Odis Hollingshead, M.D.  Electronically Signed     TJR/MEDQ  D:  01/19/2006  T:  01/19/2006  Job:  [id number]

## 2011-04-07 LAB — POCT CARDIAC MARKERS
CKMB, poc: <1 — ABNORMAL LOW
Myoglobin, poc: 66.8
Troponin i, poc: <0.05

## 2011-04-07 LAB — COMPREHENSIVE METABOLIC PANEL
ALT: 20
AST: 23
Albumin: 3.9
Calcium: 8.9
Creatinine, Ser: 1.09
GFR calc Af Amer: >60
Sodium: 141

## 2011-04-07 LAB — DIFFERENTIAL
Eosinophils Relative: 1
Lymphocytes Relative: 4 — ABNORMAL LOW
Lymphs Abs: 0.6 — ABNORMAL LOW
Monocytes Absolute: 0.5
Monocytes Relative: 4

## 2011-04-07 LAB — URINALYSIS, ROUTINE W REFLEX MICROSCOPIC
Bilirubin Urine: NEGATIVE
Glucose, UA: NEGATIVE
Hgb urine dipstick: NEGATIVE
Nitrite: NEGATIVE
Specific Gravity, Urine: 1.017
pH: 8

## 2011-04-07 LAB — CBC
MCHC: 35
MCV: 97.8
Platelets: 150
WBC: 12.8 — ABNORMAL HIGH

## 2011-04-28 LAB — LIPID PANEL
Cholesterol: 122
HDL: 33 — ABNORMAL LOW
LDL Cholesterol: 65
Triglycerides: 121

## 2011-04-28 LAB — POCT CARDIAC MARKERS
Myoglobin, poc: 69
Myoglobin, poc: 81.9
Operator id: 2725
Operator id: 4708

## 2011-04-28 LAB — CBC
HCT: 42.8
Hemoglobin: 15
Hemoglobin: 14
MCHC: 35.2
MCHC: 34.6
MCV: 94.3
MCV: 97
RBC: 4.41
RDW: 13.8
RDW: 13.4
WBC: 7.6

## 2011-04-28 LAB — HEPATIC FUNCTION PANEL
AST: 46 — ABNORMAL HIGH
Bilirubin, Direct: 0.4 — ABNORMAL HIGH
Indirect Bilirubin: 1.9 — ABNORMAL HIGH

## 2011-04-28 LAB — DIFFERENTIAL
Basophils Absolute: 0
Basophils Relative: 1
Eosinophils Relative: 4
Monocytes Absolute: 0.8 — ABNORMAL HIGH
Neutro Abs: 5.2

## 2011-04-28 LAB — CARDIAC PANEL(CRET KIN+CKTOT+MB+TROPI)
CK, MB: 1.6
CK, MB: 1.7
Relative Index: 1.6
Total CK: 103
Total CK: 104

## 2011-04-28 LAB — CK TOTAL AND CKMB (NOT AT ARMC)
Relative Index: INVALID
Total CK: 90

## 2011-04-28 LAB — COMPREHENSIVE METABOLIC PANEL
ALT: 45
Calcium: 9
Creatinine, Ser: 1.06
GFR calc non Af Amer: >60
Glucose, Bld: 108 — ABNORMAL HIGH
Sodium: 137
Total Protein: 6.9

## 2011-04-28 LAB — BASIC METABOLIC PANEL
CO2: 27
Calcium: 9
Glucose, Bld: 102 — ABNORMAL HIGH
Sodium: 139

## 2011-04-28 LAB — HEPARIN LEVEL (UNFRACTIONATED)
Heparin Unfractionated: 0.31
Heparin Unfractionated: 0.62

## 2011-04-28 LAB — TROPONIN I: Troponin I: 0.02

## 2011-04-28 LAB — PROTIME-INR: Prothrombin Time: 13.9

## 2011-04-28 LAB — LIPASE, BLOOD: Lipase: 18

## 2011-05-07 ENCOUNTER — Telehealth: Payer: Self-pay | Admitting: Family Medicine

## 2011-05-07 ENCOUNTER — Ambulatory Visit (INDEPENDENT_AMBULATORY_CARE_PROVIDER_SITE_OTHER): Payer: Medicare Other | Admitting: Family Medicine

## 2011-05-07 VITALS — BP 161/69 | HR 61 | Ht 67.0 in | Wt 194.0 lb

## 2011-05-07 DIAGNOSIS — H6120 Impacted cerumen, unspecified ear: Secondary | ICD-10-CM

## 2011-05-07 DIAGNOSIS — M546 Pain in thoracic spine: Secondary | ICD-10-CM

## 2011-05-07 DIAGNOSIS — I10 Essential (primary) hypertension: Secondary | ICD-10-CM

## 2011-05-07 NOTE — Telephone Encounter (Signed)
Faxed referral and office visit notes to [facility] location [phone]

## 2011-05-07 NOTE — Patient Instructions (Signed)
F/u 3-4 w We will call you regarding PT

## 2011-05-07 NOTE — Progress Notes (Signed)
  Subjective:    Patient ID: [name], male    DOB: [birth year], 77 y.o.   MRN: [id number]  HPI #1. Back pain. Started 3 weeks ago when he was playing and alternate. Has recently taken golf back up again after an 18 year layoff. Had acute spasm in the middle of the determinate. He got some better. He went out to play 9 holes to 3 days ago and hadn't returned. It is in the left upper back. Worse with certain movements. Not worse at all with exertion, . No associated symptoms of nausea, vomiting, shortness of breath or sweating. Radiates a little bit to the left axillary rib area when he coughs or laughs.    #2. Cerumen impaction in his right ear. Helicis and cleanout once a year and says this time. He feels uncomfortable and he has a little bit more troubles hearing. He does use a hearing aid in that ear. #3. Hypertension. He has gained weight because has not been active. He is quite discussed with himself about this. He agrees to follow up with me in 2-3 weeks so we can focus on his blood pressure. He is taking his medicines regularly.    Review of Systems     see history of present illness for pertinent review of systems.  Objective:   Physical Exam   Vital signs reviewed. GENERAL: Well developed, well nourished, no acute distress BACK: Quarter sized area that is tender to palpation over the left rhomboid muscle. This reproduces his pain exactly. The rest of the back is nontender. He does have some spasm in the left rhomboid area. The vertebra are nontender to percussion. Upper and lower extremity strength is 5 out of 5 symmetrically.   HEENT: Right  ear has significant amount of cerumen. CV RRR Lungs CTAB INJECTION: Patient was given informed consent, signed copy in the chart. Appropriate time out was taken. Area prepped and draped in usual sterile fashion. 1 cc of methylprednisolone 40 mg/ml plus  1 cc of 1% lidocaine without epinephrine was injected into the area of muscle spasm over  the left rhomboid portion of his back using a(n) perpendicular approach with 3 separate injections into the trigger point of that muscle. approach. The patient tolerated the procedure well. There were no complications. Post procedure instructions were given.      Assessment & Plan:  #1. Acute muscle spasm in the left rhomboid area. Corticosteroid trigger point injection today. We'll set him up for physical therapy. Return to clinic 3-4 weeks previous #2. Cerumen impaction. Ear canal lavage. #3. HTN--poor control---weight gain and decrease in exercise. We discussed. He is returning to exercise and he will f/u w me in 3-4 weeks re BP. No med changes in current regimen.

## 2011-05-07 NOTE — Telephone Encounter (Signed)
Please set him up with PT Starling Manns) order is in and has some specifics THANKS! Dorcas Mcmurray

## 2011-05-13 ENCOUNTER — Telehealth: Payer: Self-pay | Admitting: Family Medicine

## 2011-05-13 NOTE — Telephone Encounter (Signed)
[name] is calling back to check on the status of the Referral for Physical Therapy.  He would like for someone to call him back.

## 2011-05-13 NOTE — Telephone Encounter (Signed)
Spoke with pt's wife and informed her that her husbands information has been sent to to PT office and either they will call him and inform him of his appt or they will fax the appt time and date back to [place].[name], [name]

## 2011-05-21 ENCOUNTER — Ambulatory Visit: Payer: Medicare Other | Attending: Family Medicine | Admitting: Physical Therapy

## 2011-05-21 DIAGNOSIS — M546 Pain in thoracic spine: Secondary | ICD-10-CM | POA: Insufficient documentation

## 2011-05-21 DIAGNOSIS — IMO0001 Reserved for inherently not codable concepts without codable children: Secondary | ICD-10-CM | POA: Insufficient documentation

## 2011-05-26 ENCOUNTER — Ambulatory Visit: Payer: Medicare Other | Admitting: Physical Therapy

## 2011-05-28 ENCOUNTER — Ambulatory Visit: Payer: Medicare Other | Admitting: Physical Therapy

## 2011-06-02 ENCOUNTER — Ambulatory Visit: Payer: Medicare Other | Admitting: Physical Therapy

## 2011-06-03 ENCOUNTER — Encounter: Payer: Self-pay | Admitting: Family Medicine

## 2011-06-03 ENCOUNTER — Ambulatory Visit
Admission: RE | Admit: 2011-06-03 | Discharge: 2011-06-03 | Disposition: A | Discharge status: Home / Self Care | Payer: Medicare Other | Source: Ambulatory Visit | Attending: Family Medicine | Admitting: Family Medicine

## 2011-06-03 ENCOUNTER — Ambulatory Visit (INDEPENDENT_AMBULATORY_CARE_PROVIDER_SITE_OTHER): Payer: Medicare Other | Admitting: Family Medicine

## 2011-06-03 VITALS — BP 170/74 | HR 61 | Temp 97.9°F | Ht 67.0 in | Wt 191.0 lb

## 2011-06-03 DIAGNOSIS — K449 Diaphragmatic hernia without obstruction or gangrene: Secondary | ICD-10-CM

## 2011-06-03 DIAGNOSIS — R0789 Other chest pain: Secondary | ICD-10-CM

## 2011-06-03 DIAGNOSIS — I10 Essential (primary) hypertension: Secondary | ICD-10-CM

## 2011-06-03 DIAGNOSIS — R079 Chest pain, unspecified: Secondary | ICD-10-CM

## 2011-06-03 MED ORDER — LOSARTAN POTASSIUM-HCTZ 100-12.5 MG PO TABS: 1.0000 | ORAL_TABLET | Freq: Every day | ORAL | Status: DC

## 2011-06-03 MED ORDER — AMLODIPINE BESYLATE 5 MG PO TABS: 5.0000 mg | ORAL_TABLET | Freq: Every day | ORAL | Status: DC

## 2011-06-03 MED ORDER — ATORVASTATIN CALCIUM 10 MG PO TABS: 10.0000 mg | ORAL_TABLET | Freq: Every day | ORAL | Status: DC

## 2011-06-03 MED ORDER — BASE B POLYETHYLENE GLYCOL POWD: 1.0000 | Freq: Every day | Status: DC | PRN

## 2011-06-03 MED ORDER — HYDROCODONE-ACETAMINOPHEN 5-500 MG PO TABS: 1.0000 | ORAL_TABLET | Freq: Two times a day (BID) | ORAL | Status: DC | PRN

## 2011-06-03 MED ORDER — OMEPRAZOLE 20 MG PO CPDR: 20.0000 mg | DELAYED_RELEASE_CAPSULE | Freq: Every day | ORAL | Status: DC

## 2011-06-03 MED ORDER — NITROGLYCERIN 0.4 MG/SPRAY TL SOLN: 1.0000 | Status: DC | PRN

## 2011-06-03 NOTE — Patient Instructions (Signed)
We are getting a CT scan of your chest and abdomen. I will call you with results. I want you to increase the hyzaar to a whole tablet once daily and see how you do. I want to see you back in 3-4 weeks.

## 2011-06-03 NOTE — Progress Notes (Signed)
  Subjective:    Patient ID: [name], male    DOB: [birth year], 77 y.o.   MRN: [id number]  HPI Followup back pain. Significantly improved but still present. He is about halfway down with his physical therapy.  Having any pain in the right mid chest area. It started out occurring once or twice a week and now it is occurring daily. Sometimes with exercise such as rotating or doing his stretches for his back. Also occurs if he drinks a martini. Does not feel like his typical reflux. 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 He wonders if this is related to that. He's had no shortness of breath, no cough, no increase in typical reflux symptoms. He has gained about 30 pounds over the last year and a half.   Review of Systems     see. history of present illness.   Objective:   Physical Exam Vital signs reviewed. GENERAL: Well-developed, well-nourished, no acute distress. CARDIOVASCULAR: Regular rate and rhythm no murmur gallop or rub LUNGS: Clear to auscultation bilaterally, no rales or wheeze. ABDOMEN: Soft positive bowel sounds CHEST WALL: Mildly tender to palpation over the costovertebral junction anterior rib right below the end of the xiphoid. This reproduces his pain. NEURO: No gross focal neurological deficits. MSK: Movement of extremity x 4.       Assessment & Pla. Marland Kitchenn:  Back pain. Continue physical thearpy. Also having some increase in his pain with certain motions I have written a note on the orders for physical therapy to address that. #2. Costochondritis. Unclear history of diaphragmatic hernia. Given his back pain, his history of this diaphragmatic hernia which I was previously unaware of, his other chronic medical issues I think it would be prudent to get CT chest and abdomen which we'll do today. #3. Hypertension: Still poorly controlled. He's actually been only taking half of his Hyzaar tablet. He said it made him feel weak. We agree he would go back to  a full tablet I will follow him up in 3 weeks.

## 2011-06-09 ENCOUNTER — Ambulatory Visit: Payer: Medicare Other | Admitting: Physical Therapy

## 2011-06-11 ENCOUNTER — Ambulatory Visit: Payer: Medicare Other | Admitting: Physical Therapy

## 2011-07-01 ENCOUNTER — Ambulatory Visit: Payer: Medicare Other | Admitting: Family Medicine

## 2011-07-10 ENCOUNTER — Encounter: Payer: Self-pay | Admitting: Family Medicine

## 2011-07-22 ENCOUNTER — Encounter: Payer: Self-pay | Admitting: Family Medicine

## 2011-07-22 ENCOUNTER — Ambulatory Visit (INDEPENDENT_AMBULATORY_CARE_PROVIDER_SITE_OTHER): Payer: Medicare Other | Admitting: Family Medicine

## 2011-07-22 VITALS — BP 138/75 | HR 70 | Wt 196.2 lb

## 2011-07-22 DIAGNOSIS — F411 Generalized anxiety disorder: Secondary | ICD-10-CM

## 2011-07-22 DIAGNOSIS — M546 Pain in thoracic spine: Secondary | ICD-10-CM | POA: Diagnosis not present

## 2011-07-22 DIAGNOSIS — I10 Essential (primary) hypertension: Secondary | ICD-10-CM | POA: Diagnosis not present

## 2011-07-22 DIAGNOSIS — E669 Obesity, unspecified: Secondary | ICD-10-CM | POA: Diagnosis not present

## 2011-07-24 DIAGNOSIS — E669 Obesity, unspecified: Secondary | ICD-10-CM | POA: Insufficient documentation

## 2011-07-24 DIAGNOSIS — M546 Pain in thoracic spine: Secondary | ICD-10-CM | POA: Insufficient documentation

## 2011-07-24 DIAGNOSIS — E6609 Other obesity due to excess calories: Secondary | ICD-10-CM | POA: Insufficient documentation

## 2011-07-24 DIAGNOSIS — F411 Generalized anxiety disorder: Secondary | ICD-10-CM | POA: Insufficient documentation

## 2011-07-24 NOTE — Progress Notes (Signed)
  Subjective:    Patient ID: [name], male    DOB: [birth year], 78 y.o.   MRN: [id number]  HPI  #1. Followup hypertension. We had changed him back to a whole tablet of Hyzaar. He said the first few days he felt a little weak but now he is tolerating it well. He has continued on his amlodipine. He's had no chest pains, no shortness of breath. He has not noted any lower extremity edema. He has been compliant with his medication regimen. #2. Obesity. He is unhappy that he has continued to maintain his elevated weight. He has not been watching his diet. #3. Mid back pain is much better after physical therapy. He still has a few sessions left. He has been to the driving range with his golf clubs once and had no pain. #4. Chronic anxiety: Doing quite well on his regimen of Klonopin. It mostly helps him sleep. He seems improved by his report in his focus and concentration around the house and while driving. He stated did get a little hectic during the holidays and he is glad those are essentially done for a while.  Review of Systems See history of present illness.    Objective:   Physical Exam  Vital signs reviewed. GENERAL: Well-developed, well-nourished, no acute distress. CARDIOVASCULAR: Regular rate and rhythm no murmur gallop or rub LUNGS: Clear to auscultation bilaterally, no rales or wheeze. ABDOMEN: Soft positive bowel sounds NEURO: No gross focal neurological deficits. MSK: Movement of extremity x 4. Back is nontender to palpation in the midthoracic area. His muscle bulk and strength is grossly normal. PSYCH: Alert and oriented x4. His sense of humor is intact today. Mood is interactive.        Assessment & Plan:  #1. Hypertension. His recheck on blood pressure today was 138/75 so I think we have made progress. He does have some component of anxiety. We will continue with his current regimen. I will follow this up in 6 months. #2. Obesity. We discussed him possibly rejoining  [employer], possibly the online version. [employer] has worked for him in the past. #3. Mid back pain much improved with physical therapy. #4. Chronic anxiety doing well. I refilled his Klonopin. He seems to be doing extremely well from a mental status standpoint at this time.

## 2011-09-28 DIAGNOSIS — L259 Unspecified contact dermatitis, unspecified cause: Secondary | ICD-10-CM | POA: Diagnosis not present

## 2011-09-28 DIAGNOSIS — L820 Inflamed seborrheic keratosis: Secondary | ICD-10-CM | POA: Diagnosis not present

## 2011-10-16 ENCOUNTER — Other Ambulatory Visit: Payer: Self-pay | Admitting: Family Medicine

## 2011-10-22 DIAGNOSIS — H40059 Ocular hypertension, unspecified eye: Secondary | ICD-10-CM | POA: Diagnosis not present

## 2012-01-08 DIAGNOSIS — H16109 Unspecified superficial keratitis, unspecified eye: Secondary | ICD-10-CM | POA: Diagnosis not present

## 2012-01-12 ENCOUNTER — Telehealth: Payer: Self-pay | Admitting: Family Medicine

## 2012-01-12 ENCOUNTER — Encounter: Payer: Self-pay | Admitting: Family Medicine

## 2012-01-12 ENCOUNTER — Ambulatory Visit (INDEPENDENT_AMBULATORY_CARE_PROVIDER_SITE_OTHER): Payer: Medicare Other | Admitting: Family Medicine

## 2012-01-12 VITALS — BP 140/80 | Temp 98.1°F | Wt 193.0 lb

## 2012-01-12 DIAGNOSIS — R42 Dizziness and giddiness: Secondary | ICD-10-CM

## 2012-01-12 DIAGNOSIS — M545 Low back pain, unspecified: Secondary | ICD-10-CM

## 2012-01-12 DIAGNOSIS — I10 Essential (primary) hypertension: Secondary | ICD-10-CM

## 2012-01-12 MED ORDER — AMOXICILLIN-POT CLAVULANATE 875-125 MG PO TABS: 1.0000 | ORAL_TABLET | Freq: Two times a day (BID) | ORAL | Status: AC

## 2012-01-12 MED ORDER — HYDROCODONE-ACETAMINOPHEN 5-500 MG PO TABS: 1.0000 | ORAL_TABLET | Freq: Two times a day (BID) | ORAL | Status: DC | PRN

## 2012-01-12 NOTE — Telephone Encounter (Signed)
Spoke with patient and informed him of below 

## 2012-01-12 NOTE — Telephone Encounter (Signed)
Dear Dema Severin Team Please call this in and then let Salmi know. Plz tell him I apologize that it was not already there--I am old and getiing slower by the day THANKS! Dorcas Mcmurray

## 2012-01-12 NOTE — Telephone Encounter (Signed)
LVM for patient to call back to informed that I have called in RX

## 2012-01-12 NOTE — Patient Instructions (Addendum)
I am calling in some antibiotics--take them---let me see you in 2-3 weeks.

## 2012-01-13 ENCOUNTER — Encounter: Payer: Self-pay | Admitting: Family Medicine

## 2012-01-13 NOTE — Progress Notes (Signed)
  Subjective:    Patient ID: [name], male    DOB: 26-Mar-1934, 78 y.o.   MRN: [id number]  HPI  #136 weeks of left thigh pain. He saw his eye doctor and had a complete exam. I Dr. seems to think he is having some sinus issues. He does have increased pain he bends over. He also has pain if he looks upward. No tooth pain. No fever. Has had nasal congestion. Denies fever. Has had some dizziness intermittently. #2. Constipation. He uses his Vicodin very infrequently sometimes half a tablet day for his chronic [place] arthritis of low back and knees. This allows him to do his activities without much pain but he wonders if it is contributing to his constipation and to his dizziness. #3. Followup blood pressure. We had made some medication changes. Other than the dizziness mentioned above he's not having any issues. He's not had any lower extremity swelling, chest pains.  Review of Systems    please see history of present illness above. Objective:   Physical Exam   Vital signs reviewed. GENERAL: Well-developed, well-nourished, no acute distress. CARDIOVASCULAR: Regular rate and rhythm no murmur gallop or rub LUNGS: Clear to auscultation bilaterally, no rales or wheeze. ABDOMEN: Soft positive bowel sounds NEURO: No gross focal neurological deficits. MSK: Movement of extremity x 4. FACE: To palpation over the left frontal sinus area. Nasal mucosa is boggy and swollen with some scant amount of discharge. Neck is without lymphadenopathy.      Assessment & Plan:  #1. Possible chronic sinus infection versus inflammation. We agreed to do 2 weeks of antibiotics and then see him back. It is not having relief of symptoms I think we have to do a sinus CT he'll let me know. #2. Constipation. We'll start him back on regular fiber. #3. Chronic pain from osteoarthritis. He is using very low dose Vicodin. It seems to really help his activities so we'll continue and I refilled that today. #4. Some  intermittent dizziness. This may be related to his chronic hearing loss, sinus inflammation or medication use. I think we'll just follow this for of the time. #45. Hypertension good control that any side effects we'll continue his current regimen.

## 2012-02-10 ENCOUNTER — Ambulatory Visit (INDEPENDENT_AMBULATORY_CARE_PROVIDER_SITE_OTHER): Payer: Medicare Other | Admitting: Family Medicine

## 2012-02-10 ENCOUNTER — Encounter: Payer: Self-pay | Admitting: Family Medicine

## 2012-02-10 VITALS — BP 136/73 | HR 51 | Temp 97.0°F | Ht 67.0 in | Wt 193.8 lb

## 2012-02-10 DIAGNOSIS — J019 Acute sinusitis, unspecified: Secondary | ICD-10-CM

## 2012-02-10 DIAGNOSIS — I10 Essential (primary) hypertension: Secondary | ICD-10-CM | POA: Diagnosis not present

## 2012-02-10 DIAGNOSIS — E785 Hyperlipidemia, unspecified: Secondary | ICD-10-CM

## 2012-02-10 DIAGNOSIS — I2510 Atherosclerotic heart disease of native coronary artery without angina pectoris: Secondary | ICD-10-CM

## 2012-02-10 LAB — CBC WITH DIFFERENTIAL/PLATELET
Basophils Relative: 2 % — ABNORMAL HIGH (ref 0–1)
Eosinophils Absolute: 0.5 10*3/uL (ref 0.0–0.7)
Lymphs Abs: 1.6 10*3/uL (ref 0.7–4.0)
MCH: 33.8 pg (ref 26.0–34.0)
Neutro Abs: 2.3 10*3/uL (ref 1.7–7.7)
Neutrophils Relative %: 44 % (ref 43–77)
Platelets: 150 10*3/uL (ref 150–400)
RBC: 4.35 MIL/uL (ref 4.22–5.81)

## 2012-02-10 LAB — COMPREHENSIVE METABOLIC PANEL
Albumin: 4.4 g/dL (ref 3.5–5.2)
Alkaline Phosphatase: 65 U/L (ref 39–117)
CO2: 29 mEq/L (ref 19–32)
Calcium: 9.6 mg/dL (ref 8.4–10.5)
Chloride: 102 mEq/L (ref 96–112)
Glucose, Bld: 98 mg/dL (ref 70–99)
Potassium: 4 mEq/L (ref 3.5–5.3)
Sodium: 141 mEq/L (ref 135–145)
Total Protein: 7 g/dL (ref 6.0–8.3)

## 2012-02-10 LAB — LDL CHOLESTEROL, DIRECT: Direct LDL: 78 mg/dL

## 2012-02-10 NOTE — Patient Instructions (Addendum)
Great to see you!  Return to clinic in 3 months---sooner if anything new pops up.  I will send you a note about your lab work.

## 2012-02-14 ENCOUNTER — Encounter: Payer: Self-pay | Admitting: Family Medicine

## 2012-02-14 NOTE — Assessment & Plan Note (Signed)
Good control

## 2012-02-14 NOTE — Progress Notes (Signed)
  Subjective:    Patient ID: [name], male    DOB: [birth year], 78 y.o.   MRN: [id number]  HPI F/u left eye pain and dizziness Completely resolved HTN F/U: Taking medicines regularly without problems. Denies chest pain, shortness of breath.    Review of Systems    Pertinent review of systems: negative for fever or unusual weight change. Objective:   Physical Exam    Vital signs reviewed. GENERAL: Well-developed, well-nourished, no acute distress. CARDIOVASCULAR: Regular rate and rhythm no murmur gallop or rub LUNGS: Clear to auscultation bilaterally, no rales or wheeze. ABDOMEN: Soft positive bowel sounds NEURO: No gross focal neurological deficits. MSK: Movement of extremity x 4.      Assessment & Plan:  Sinusitis w eye pain and occ dizziness -resolved 2Htn good control Chronic medical conditions---needs labs

## 2012-02-19 ENCOUNTER — Telehealth: Payer: Self-pay | Admitting: Family Medicine

## 2012-02-19 NOTE — Telephone Encounter (Signed)
Patient is calling for results of his bloodwork.  It is ok to leave the results on his voicemail.

## 2012-02-23 ENCOUNTER — Encounter: Payer: Self-pay | Admitting: Family Medicine

## 2012-02-23 NOTE — Telephone Encounter (Signed)
Will call patient with results.  Called patient and left detailed message with lab results per his request.  Also, printed letter and mailed to patient.   Nolene Ebbs, RN

## 2012-02-23 NOTE — Telephone Encounter (Signed)
Jeanett Schlein AGAIn, please tell them to tell patients I am OUT. Patients do not like it when I do not call back! This was on the 9th---so I guess he has been waiting. I am sending him a letter now. If you want you can read the letter to him [facility]! Dorcas Mcmurray

## 2012-03-03 DIAGNOSIS — M542 Cervicalgia: Secondary | ICD-10-CM | POA: Diagnosis not present

## 2012-03-03 DIAGNOSIS — H6120 Impacted cerumen, unspecified ear: Secondary | ICD-10-CM | POA: Diagnosis not present

## 2012-03-03 DIAGNOSIS — H9190 Unspecified hearing loss, unspecified ear: Secondary | ICD-10-CM | POA: Diagnosis not present

## 2012-03-21 DIAGNOSIS — N393 Stress incontinence (female) (male): Secondary | ICD-10-CM | POA: Diagnosis not present

## 2012-03-21 DIAGNOSIS — C61 Malignant neoplasm of prostate: Secondary | ICD-10-CM | POA: Diagnosis not present

## 2012-04-26 DIAGNOSIS — Z23 Encounter for immunization: Secondary | ICD-10-CM | POA: Diagnosis not present

## 2012-04-26 DIAGNOSIS — H25019 Cortical age-related cataract, unspecified eye: Secondary | ICD-10-CM | POA: Diagnosis not present

## 2012-04-26 DIAGNOSIS — H2510 Age-related nuclear cataract, unspecified eye: Secondary | ICD-10-CM | POA: Diagnosis not present

## 2012-04-26 DIAGNOSIS — H40059 Ocular hypertension, unspecified eye: Secondary | ICD-10-CM | POA: Diagnosis not present

## 2012-05-03 DIAGNOSIS — Z23 Encounter for immunization: Secondary | ICD-10-CM | POA: Diagnosis not present

## 2012-05-04 ENCOUNTER — Telehealth: Payer: Self-pay | Admitting: Family Medicine

## 2012-05-04 NOTE — Telephone Encounter (Addendum)
In centricity it is noted that patient received Tdap 03/16 /2010.  It is documented in office note from that date.  Patient notified.

## 2012-05-04 NOTE — Telephone Encounter (Signed)
Pt is going to be around a newborn and wants to get a tdap

## 2012-06-20 ENCOUNTER — Other Ambulatory Visit: Payer: Self-pay | Admitting: Family Medicine

## 2012-07-11 ENCOUNTER — Encounter: Payer: Self-pay | Admitting: Family Medicine

## 2012-07-11 ENCOUNTER — Ambulatory Visit (INDEPENDENT_AMBULATORY_CARE_PROVIDER_SITE_OTHER): Payer: Medicare Other | Admitting: Family Medicine

## 2012-07-11 VITALS — BP 163/83 | HR 63 | Temp 98.7°F | Ht 67.0 in | Wt 196.0 lb

## 2012-07-11 DIAGNOSIS — I10 Essential (primary) hypertension: Secondary | ICD-10-CM | POA: Diagnosis not present

## 2012-07-11 MED ORDER — ATORVASTATIN CALCIUM 10 MG PO TABS: 10.0000 mg | ORAL_TABLET | Freq: Every day | ORAL | Status: DC

## 2012-07-11 MED ORDER — AMLODIPINE BESYLATE 5 MG PO TABS: 5.0000 mg | ORAL_TABLET | Freq: Every day | ORAL | Status: DC

## 2012-07-11 MED ORDER — NITROGLYCERIN 0.4 MG/SPRAY TL SOLN: 1.0000 | Status: DC | PRN

## 2012-07-11 MED ORDER — HYDROCODONE-ACETAMINOPHEN 5-500 MG PO TABS: 1.0000 | ORAL_TABLET | Freq: Two times a day (BID) | ORAL | Status: DC | PRN

## 2012-07-11 MED ORDER — LOSARTAN POTASSIUM-HCTZ 100-12.5 MG PO TABS: 1.0000 | ORAL_TABLET | Freq: Every day | ORAL | Status: DC

## 2012-07-11 MED ORDER — CLONAZEPAM 0.5 MG PO TABS: 0.5000 mg | ORAL_TABLET | ORAL | Status: DC

## 2012-07-11 MED ORDER — OMEPRAZOLE 20 MG PO CPDR: 20.0000 mg | DELAYED_RELEASE_CAPSULE | Freq: Every day | ORAL | Status: DC

## 2012-07-11 MED ORDER — BASE B POLYETHYLENE GLYCOL POWD: 1.0000 | Freq: Every day | Status: DC | PRN

## 2012-07-11 MED ORDER — BASE B POLYETHYLENE GLYCOL POWD: Status: DC

## 2012-07-15 ENCOUNTER — Encounter: Payer: Self-pay | Admitting: Family Medicine

## 2012-07-15 NOTE — Progress Notes (Signed)
  Subjective:    Patient ID: DEMECO YAGI, male    DOB: 1934/04/27, 79 y.o.   MRN: [id number]  HPI  Followup hypertension. Need some refills. Doing fairly well. He has gained some weight and he has plans to restart his exercise next week. #2. He is here today with his wife. Still has some issues with memory radicular short-term memory but overall is doing fairly well. No problems with ADLs at the current time.  Review of Systems Denies chest pain, denies lower extremity edema, denies shortness of breath.    Objective:   Physical Exam   Vital signs reviewed. GENERAL: Well-developed, well-nourished, no acute distress. CARDIOVASCULAR: Regular rate and rhythm no murmur gallop or rub LUNGS: Clear to auscultation bilaterally, no rales or wheeze. ABDOMEN: Soft positive bowel sounds NEURO: No gross focal neurological deficits. MSK: Movement of extremity x 4.       Assessment & Plan:  #1. Hypertension. Her new guidelines he's pre-well-controlled will make no changes. Will refill his medicines.

## 2012-07-19 ENCOUNTER — Other Ambulatory Visit: Payer: Self-pay | Admitting: Family Medicine

## 2012-07-19 NOTE — Telephone Encounter (Signed)
Dear Dema Severin Team I HANDED him a new Rx for vicodin when I saw him last Wednesday. Has he lost it? Or is  This just an automated refille request/ THANKS! Dorcas Mcmurray

## 2012-07-22 NOTE — Telephone Encounter (Signed)
Pt stated that he took the rx by the pharmacy and told them that he wanted them to keep it on file until it was time for it to be filled. He did not need another rx.Maryruth Eve, [place] Crocker

## 2012-08-05 ENCOUNTER — Other Ambulatory Visit: Payer: Self-pay | Admitting: Family Medicine

## 2012-08-08 ENCOUNTER — Ambulatory Visit (HOSPITAL_COMMUNITY)
Admission: RE | Admit: 2012-08-08 | Discharge: 2012-08-08 | Disposition: A | Discharge status: Home / Self Care | Payer: Medicare Other | Source: Ambulatory Visit | Attending: Family Medicine | Admitting: Family Medicine

## 2012-08-08 ENCOUNTER — Ambulatory Visit (INDEPENDENT_AMBULATORY_CARE_PROVIDER_SITE_OTHER): Payer: Medicare Other | Admitting: Family Medicine

## 2012-08-08 ENCOUNTER — Telehealth: Payer: Self-pay | Admitting: *Deleted

## 2012-08-08 ENCOUNTER — Encounter: Payer: Self-pay | Admitting: Family Medicine

## 2012-08-08 VITALS — BP 148/88 | HR 60 | Ht 67.0 in | Wt 197.0 lb

## 2012-08-08 DIAGNOSIS — M25519 Pain in unspecified shoulder: Secondary | ICD-10-CM | POA: Diagnosis not present

## 2012-08-08 DIAGNOSIS — M25512 Pain in left shoulder: Secondary | ICD-10-CM | POA: Insufficient documentation

## 2012-08-08 DIAGNOSIS — I498 Other specified cardiac arrhythmias: Secondary | ICD-10-CM | POA: Diagnosis not present

## 2012-08-08 MED ORDER — HYDROCODONE-ACETAMINOPHEN 5-325 MG PO TABS: 1.0000 | ORAL_TABLET | Freq: Three times a day (TID) | ORAL | Status: DC | PRN

## 2012-08-08 NOTE — Patient Instructions (Addendum)
Thank you for coming in today, it was good to see you The injection site may be a little tender but you should not have swelling or drainage. If you do please return If your pain is not improving follow up with Dr. Nori Riis If you have more episodes of chest pain follow up with Dr. Nori Riis or go to the ED.

## 2012-08-08 NOTE — Telephone Encounter (Signed)
Spoke with Jenny Reichmann at [facility] and gave changed rx to her

## 2012-08-08 NOTE — Progress Notes (Signed)
  Subjective:    Patient ID: [name], male    DOB: [birth year], 79 y.o.   MRN: [id number]  HPI  1. Shoulder pain:  Patient here today with c/o of L shoulder pain.  Has had this for a little over 6 weeks.  Pain described as "throbbing like a toothache".  Pain worse with overhead movement or reaching across chest.  Denies any trauma or overuse, but did note that this started about a week after playing 9 holes of golf.  He does endorse having some mild chest tightness while laying in bed last night that lasted about 1/2 hour.  He thinks this was anxiety.  He did not have any associated shortness of breath, diaphoresis or nausea with this.  Denies numbness or tingling down the arm or weakness.    Review of Systems Per HPI    Objective:   Physical Exam  Constitutional: He appears well-nourished. No distress.  HENT:  Head: Normocephalic and atraumatic.  Neck: Normal range of motion.  Cardiovascular: Normal rate and regular rhythm.   Pulmonary/Chest: Effort normal and breath sounds normal. No respiratory distress. He has no wheezes. He has no rales.  Musculoskeletal:       L shoulder normal to inspection and palpation.  Limited ROM with overhead movement.   Rotator cuff testing with good strength, some limitation due to pain No drop arm sign + Hawkins and neer Mildly positive scarf sign.           Assessment & Plan:

## 2012-08-08 NOTE — Assessment & Plan Note (Addendum)
Impingement syndrome likely, injected subacromial bursa.  Some relief before leaving the office.  Give post procedure instructions I do not think the chest pain he experienced is cardiac in origin and is more likely due to anxiety surrounding his shoulder pain.  Advised if he has increased frequency of chest pain to return.  If he has chest pain that does not resolve to go to the ED. Differential includes cervical radiculopathy as well but pain seemed to be more subacromial today and made worse by positioning of the shoulder, advised if not improving to follow up with Dr. Nori Riis. Strength pretty good so rotator cuff tear less likely.

## 2012-08-08 NOTE — Telephone Encounter (Signed)
Pharmacist at [facility] left message on pharmacy line.  [name] is on Hydrocodone-Acetaminophen 5-500.  It is being discontinued and the pharmacist is requesting a new prescription for hydrocodone with a lower dose of acetaminophen.  Will forward to Dr. Nori Riis for review.  Lauralyn Primes, [place]

## 2012-11-03 DIAGNOSIS — H40059 Ocular hypertension, unspecified eye: Secondary | ICD-10-CM | POA: Diagnosis not present

## 2012-12-21 ENCOUNTER — Ambulatory Visit (INDEPENDENT_AMBULATORY_CARE_PROVIDER_SITE_OTHER): Payer: Medicare Other | Admitting: Family Medicine

## 2012-12-21 ENCOUNTER — Encounter: Payer: Self-pay | Admitting: Family Medicine

## 2012-12-21 VITALS — BP 149/61 | HR 60 | Temp 98.3°F | Ht 67.0 in | Wt 193.0 lb

## 2012-12-21 DIAGNOSIS — C61 Malignant neoplasm of prostate: Secondary | ICD-10-CM | POA: Diagnosis not present

## 2012-12-21 DIAGNOSIS — R3989 Other symptoms and signs involving the genitourinary system: Secondary | ICD-10-CM | POA: Diagnosis not present

## 2012-12-21 DIAGNOSIS — N401 Enlarged prostate with lower urinary tract symptoms: Secondary | ICD-10-CM

## 2012-12-21 DIAGNOSIS — E785 Hyperlipidemia, unspecified: Secondary | ICD-10-CM

## 2012-12-21 DIAGNOSIS — D075 Carcinoma in situ of prostate: Secondary | ICD-10-CM

## 2012-12-21 DIAGNOSIS — I10 Essential (primary) hypertension: Secondary | ICD-10-CM

## 2012-12-21 DIAGNOSIS — Z0000 Encounter for general adult medical examination without abnormal findings: Secondary | ICD-10-CM | POA: Diagnosis not present

## 2012-12-21 DIAGNOSIS — I2510 Atherosclerotic heart disease of native coronary artery without angina pectoris: Secondary | ICD-10-CM | POA: Diagnosis not present

## 2012-12-21 LAB — COMPREHENSIVE METABOLIC PANEL
ALT: 15 U/L (ref 0–53)
BUN: 17 mg/dL (ref 6–23)
CO2: 29 mEq/L (ref 19–32)
Calcium: 9.7 mg/dL (ref 8.4–10.5)
Chloride: 102 mEq/L (ref 96–112)
Creat: 1.16 mg/dL (ref 0.50–1.35)
Glucose, Bld: 99 mg/dL (ref 70–99)
Total Bilirubin: 2.3 mg/dL — ABNORMAL HIGH (ref 0.3–1.2)

## 2012-12-21 LAB — LDL CHOLESTEROL, DIRECT: Direct LDL: 70 mg/dL

## 2012-12-21 NOTE — Patient Instructions (Addendum)
You should probably come get your ears cleaned out every 4-6 months. We are happy to do that. I will send you a note about your blood work.  Everything else looks great/ Please remember to get a flu shot in the fall.

## 2012-12-22 LAB — PSA: PSA: <0.01 ng/mL (ref <=4.00)

## 2012-12-28 ENCOUNTER — Encounter: Payer: Self-pay | Admitting: Family Medicine

## 2013-01-02 ENCOUNTER — Encounter: Payer: Self-pay | Admitting: Family Medicine

## 2013-01-02 DIAGNOSIS — Z0000 Encounter for general adult medical examination without abnormal findings: Secondary | ICD-10-CM | POA: Insufficient documentation

## 2013-01-02 NOTE — Progress Notes (Signed)
  Subjective:    Patient ID: [name], male    DOB: [birth year], 79 y.o.   MRN: [id number]  HPI  Check up Has been riding his bioke for exercise---several days a week. Has also given up alcohol---has lost some weight and is over all feeling better. Still some problems with memory but they do not seem to have gotten worse. Otherwise feeling well. Ears feel a little stopped up---wonders if we could check for wax---this has been a problem for him.  Review of Systems  Constitutional: Positive for activity change. Negative for appetite change, fatigue and unexpected weight change.  HENT: Positive for hearing loss. Negative for trouble swallowing, neck pain and tinnitus.   Eyes: Negative for visual disturbance.  Respiratory: Negative for chest tightness and shortness of breath.   Cardiovascular: Negative for chest pain.  Genitourinary: Negative for dysuria, urgency and difficulty urinating.  Musculoskeletal: Negative for joint swelling.  Skin: Negative for rash.  Neurological: Negative for dizziness and headaches.  Psychiatric/Behavioral: Negative for confusion and agitation.       Objective:   Physical Exam  Constitutional: He is oriented to person, place, and time. He appears well-developed and well-nourished.  HENT:  Head: Normocephalic.  Cerumen impaction B--was irrigated out by nursing and TMs look normal.  Eyes: Conjunctivae and EOM are normal. Pupils are equal, round, and reactive to light. Right eye exhibits no discharge.  Neck: Normal range of motion. No JVD present. No thyromegaly present.  Cardiovascular: Normal rate and regular rhythm.   Murmur heard. Pulmonary/Chest: Breath sounds normal.  Abdominal: Bowel sounds are normal.  Musculoskeletal: Normal range of motion. He exhibits no edema.  Lymphadenopathy:    He has no cervical adenopathy.  Neurological: He is alert and oriented to person, place, and time. He displays normal reflexes. No cranial nerve deficit.  Coordination normal.  Skin: No rash noted.  Psychiatric: He has a normal mood and affect. His behavior is normal. Judgment and thought content normal.          Assessment & Plan:

## 2013-01-02 NOTE — Assessment & Plan Note (Signed)
Congratulated on exercise and dietary changes. Seems to be doing well---continue current meds--get some labs

## 2013-01-19 ENCOUNTER — Encounter: Payer: Self-pay | Admitting: Family Medicine

## 2013-01-19 ENCOUNTER — Ambulatory Visit (INDEPENDENT_AMBULATORY_CARE_PROVIDER_SITE_OTHER): Payer: Medicare Other | Admitting: Family Medicine

## 2013-01-19 VITALS — BP 160/78 | HR 58 | Temp 98.0°F | Ht 67.0 in | Wt 193.0 lb

## 2013-01-19 DIAGNOSIS — R300 Dysuria: Secondary | ICD-10-CM

## 2013-01-19 DIAGNOSIS — R34 Anuria and oliguria: Secondary | ICD-10-CM

## 2013-01-19 DIAGNOSIS — I10 Essential (primary) hypertension: Secondary | ICD-10-CM | POA: Diagnosis not present

## 2013-01-19 LAB — POCT URINALYSIS DIPSTICK
Nitrite, UA: NEGATIVE
Protein, UA: NEGATIVE
Spec Grav, UA: 1.02
Urobilinogen, UA: 2

## 2013-01-19 MED ORDER — HYDROCHLOROTHIAZIDE 12.5 MG PO CAPS: 12.5000 mg | ORAL_CAPSULE | Freq: Every day | ORAL | Status: DC

## 2013-01-19 NOTE — Patient Instructions (Addendum)
Thank you for coming in today Please start taking the HCTZ 12.5 for your blood pressure and urinary symptoms Please continue to track your input and output Please keep track of your daily weights Please come back in 4 weeks to meet with Dr. Nori Riis  Have a great summer.

## 2013-01-19 NOTE — Progress Notes (Signed)
[name] is a 79 y.o. male who presents to [facility] today for SD appt for urinary concerns  Concerns about urination. Started to retain "urine" last month after coming off of HCTZ and Losartan combo BP medication. Initially tapered dose but then completely stopped. Urine output decreased per pt records adn pt was net positive on fluid count. Did not weigh self. Associated w/ feelings of abdominal fullness and w/ R sided flank pain w/ radiation to midline of abdomen. Reports feeling woozy on combo losartan/HCTZ. Denies any CP, HA, Palpitations, dysuria. Of note pt is s/p prostatectomy several years ago and denies any urgency or low stream velocity.   The following portions of the patient's history were reviewed and updated as appropriate: allergies, current medications, past medical history, family and social history, and problem list.  Patient is a nonsmoker.  Past Medical History  Diagnosis Date  . Diverticulitis     recurrent  . Tinnitus   . Hearing aid worn   . Kidney stone     lithotripsy [id number] w complications, req stents, Dr Rosana Hoes  . Prostate ca     ROS as above otherwise neg.    Medications reviewed. Current Outpatient Prescriptions  Medication Sig Dispense Refill  . albuterol (PROVENTIL HFA) 108 (90 BASE) MCG/ACT inhaler Inhale 2 puffs into the lungs 4 (four) times daily as needed.        Marland Kitchen amLODipine (NORVASC) 5 MG tablet Take 1 tablet (5 mg total) by mouth daily.  90 tablet  3  . aspirin (BAYER ASPIRIN) 325 MG tablet Take 325 mg by mouth daily.        Marland Kitchen atorvastatin (LIPITOR) 10 MG tablet Take 1 tablet (10 mg total) by mouth daily.  90 tablet  3  . cetirizine (ZYRTEC) 10 MG tablet Take 10 mg by mouth daily. As needed for allergies       . clonazePAM (KLONOPIN) 0.5 MG tablet Take 1 tablet (0.5 mg total) by mouth as directed. Take 1 tablet by mouth at bedtime and 1/2 tab by mouth once daily as needed  30 tablet  5  . HYDROcodone-acetaminophen (NORCO/VICODIN) 5-325 MG per tablet Take  1 tablet by mouth every 8 (eight) hours as needed for pain.  90 tablet  5  . losartan-hydrochlorothiazide (HYZAAR) 100-12.5 MG per tablet TAKE 1 TABLET BY MOUTH EVERY DAY  90 tablet  2  . nitroGLYCERIN (NITROLINGUAL) 0.4 MG/SPRAY spray Place 1 spray under the tongue as needed. For exertional chest pain  12 g  12  . omeprazole (PRILOSEC) 20 MG capsule Take 1 capsule (20 mg total) by mouth daily.  90 capsule  3  . Polyethylene Glycol 3350 (BASE B POLYETHYLENE GLYCOL) POWD 17 grams of powder mixed with water every other day. Disp qs for 3 months  2500 g  3  . triamcinolone (KENALOG) 0.1 % lotion Apply topically.         No current facility-administered medications for this visit.    Exam:  BP 160/78  Pulse 58  Temp(Src) 98 F (36.7 C) (Oral)  Ht 5\' 7"  (1.702 m)  Wt 193 lb (87.544 kg)  BMI 30.22 kg/m2 Gen: Well NAD HEENT: EOMI,  MMM   Results for orders placed in visit on 01/19/13 (from the past 72 hour(s))  POCT URINALYSIS DIPSTICK     Status: Abnormal   Collection Time    01/19/13  9:05 AM      Result Value Range   Color, UA yellow  Clarity, UA clear     Glucose, UA negative     Bilirubin, UA negative     Ketones, UA negative     Spec Grav, UA 1.020     Blood, UA small     pH, UA 5.5     Protein, UA negative     Urobilinogen, UA 2.0     Nitrite, UA negative     Leukocytes, UA Negative

## 2013-01-19 NOTE — Assessment & Plan Note (Signed)
Elevated today likely secondary to white coat syndrome and stopping previous BP meds Will restart HCTZ alone at this time Pt aware of ss of hypotension. Will check home BPs and will return in 4 wks for f/u

## 2013-01-19 NOTE — Assessment & Plan Note (Signed)
Likely secondary to discontinuation of diuretic Restart HCTZ for symptoms and BP control Wt stable from previous exam at 193. Continue daily journal of fluid input and output Daily morning wts Return in 4 wks for f/u.  UA nml

## 2013-01-27 DIAGNOSIS — K5900 Constipation, unspecified: Secondary | ICD-10-CM | POA: Diagnosis not present

## 2013-01-30 ENCOUNTER — Ambulatory Visit (INDEPENDENT_AMBULATORY_CARE_PROVIDER_SITE_OTHER): Payer: Medicare Other | Admitting: Family Medicine

## 2013-01-30 ENCOUNTER — Ambulatory Visit
Admission: RE | Admit: 2013-01-30 | Discharge: 2013-01-30 | Disposition: A | Discharge status: Home / Self Care | Payer: Medicare Other | Source: Ambulatory Visit | Attending: Family Medicine | Admitting: Family Medicine

## 2013-01-30 ENCOUNTER — Encounter: Payer: Self-pay | Admitting: Family Medicine

## 2013-01-30 VITALS — BP 180/79 | HR 60 | Ht 67.0 in | Wt 199.0 lb

## 2013-01-30 DIAGNOSIS — K5900 Constipation, unspecified: Secondary | ICD-10-CM | POA: Diagnosis not present

## 2013-01-30 DIAGNOSIS — M546 Pain in thoracic spine: Secondary | ICD-10-CM

## 2013-01-30 DIAGNOSIS — R300 Dysuria: Secondary | ICD-10-CM

## 2013-01-30 DIAGNOSIS — R319 Hematuria, unspecified: Secondary | ICD-10-CM | POA: Diagnosis not present

## 2013-01-30 DIAGNOSIS — R109 Unspecified abdominal pain: Secondary | ICD-10-CM | POA: Diagnosis not present

## 2013-01-30 LAB — POCT URINALYSIS DIPSTICK
Ketones, UA: NEGATIVE
Leukocytes, UA: NEGATIVE
Protein, UA: NEGATIVE
Spec Grav, UA: 1.01
Urobilinogen, UA: 0.2
pH, UA: 6.5

## 2013-01-30 MED ORDER — BACLOFEN 5 MG HALF TABLET: 5.0000 mg | ORAL_TABLET | Freq: Two times a day (BID) | ORAL | Status: DC | PRN

## 2013-01-30 NOTE — Progress Notes (Signed)
Patient ID: [name]    DOB: [birth year], 79 y.o.   MRN: [id number] --- Subjective:  [name] is a 79 y.o.male with h/o HTN, HLD, generalized anxiety disorder, h/o prostate resection for prostate cancer, who presents with mid back pain and constipation.   - constipation: decreased amount and number of bowel movements in the last 3 weeks. He states that he has one bowel movement per day and that it is soft but a very small amount compared to normal. No hard stools in pebble shape. No straining. No blood in stool. No abdominal pain, no nausea, no vomiting. He reports a large amount of gas. 2 days ago, he has more liquid stool, double the recent volume.  Drinks 70-80oz of fluid per day. Did start drinking caffeinated drinks when this first started and wanted to know if there could be correlation.   He was seen by a doctor close to his house last Friday and had a DRE which did not show any stool in the rectal vault.  He was prescribed miralax daily and took ne colace that evening. It has helped a little. He used to take miralax every other day. He had a colonoscopy in 2006 and was told to have repeat colonoscopy in 10 years. He made an appointment to see Dr. Cristina King on August 6th.   - back pain: started last week, located in right side of middle back. Sharp pain. Doesn't radiate to legs. Small movements to the side made it worst. Worst with going in and out of bed. Wearing lumbar support belt which helps. Took ibuprofen and 1/2 vicodin which also helped. He remembers riding his bike and hitting a root around the time back started hurting. No weakness in lower extremities, no bowel or urine incontinence  - blood in urine: noticed it prior to his last visit. Had a urinalysis that showed trace hg. Recently, no more blood in urine. No dysuria, no polyuria.    ROS: see HPI Past Medical History: reviewed and updated medications and allergies. Social History: Tobacco: former smoker.  Objective: Filed  Vitals:   01/30/13 0938  BP: 180/79  Pulse: 60    Physical Examination:   General appearance - alert, well appearing, and in no distress Back - normal flexion and extension, reproducible pain with bending back to right and left, no tenderness along spine, some tenderness along parathoracic muscle on right with associated spasm.  Negative straight leg, normal strength in knee flexion, extension, plantar and dorsiflexion of foot bilaterally.  Abdominal - soft, present bowel sounds, non tender, non distended.  DRE - anal tag present, no prostate appreciated, no hard stool in rectal vault Fecal occult blood: negative

## 2013-01-30 NOTE — Patient Instructions (Signed)
For the constipation, I am getting a plain film of your abdomen to see if there is significant stool load. The stool tested negative for blood which is reassuring.  You can increase the amount of miralax you take to twice a day. It may make you a little extra gassy, but should help with the constipation. It will be very important to keep your appointment with Dr. Cristina Gong.   For the back pain, it is likely from a muscle spasm. You can use an icy hot pad on the back to see if that helps. I am sending a prescription for a muscle relaxant but this can cause constipation, so I would recommend taking it only if the pain is very difficult to withstand. Continue with tylenol and ibuprofen (don't exceed tylenol 3g per day or ibuprofen more than 600mg  every 6 hours).

## 2013-01-30 NOTE — Assessment & Plan Note (Signed)
Decreased perceived amount of stools per day. Fecal occult is negative which is reassuring. However, patient will most importantly benefit from GI appointment to be evaluated for need for colonoscopy.  In the meantime, obtained KUB which did not show significant stool burden. Will increase miralax to twice a day until regular bowel movements. Patient to follow up with PCP in 7-10 days.

## 2013-01-30 NOTE — Assessment & Plan Note (Addendum)
Likely from muscle sprain and spasm. Recommended tylenol for pain. Icy hot or heating pad on muscles. Also recommended continued physical activity as long as it doesn't hurt his back. Also sent Rx for baclofen although warned patient to take it very sparingly, only for acute flare, as this could make his constipation worst. Patient understood. Reviewed red flags for return: weakness, urine, bowel incontinence.  Patient to follow up with Dr. Nori Riis in 7-10 days.

## 2013-02-07 DIAGNOSIS — R141 Gas pain: Secondary | ICD-10-CM | POA: Diagnosis not present

## 2013-02-07 DIAGNOSIS — R142 Eructation: Secondary | ICD-10-CM | POA: Diagnosis not present

## 2013-02-07 DIAGNOSIS — R143 Flatulence: Secondary | ICD-10-CM | POA: Diagnosis not present

## 2013-02-07 DIAGNOSIS — R110 Nausea: Secondary | ICD-10-CM | POA: Diagnosis not present

## 2013-02-07 DIAGNOSIS — R198 Other specified symptoms and signs involving the digestive system and abdomen: Secondary | ICD-10-CM | POA: Diagnosis not present

## 2013-03-21 ENCOUNTER — Encounter: Payer: Self-pay | Admitting: Family Medicine

## 2013-03-21 ENCOUNTER — Other Ambulatory Visit: Payer: Self-pay | Admitting: Family Medicine

## 2013-03-21 ENCOUNTER — Telehealth: Payer: Self-pay | Admitting: *Deleted

## 2013-03-21 MED ORDER — BASE B POLYETHYLENE GLYCOL POWD: Status: DC

## 2013-03-21 NOTE — Telephone Encounter (Signed)
Left message on patients voicemail to [facility]. [name], [name]

## 2013-04-22 DIAGNOSIS — Z23 Encounter for immunization: Secondary | ICD-10-CM | POA: Diagnosis not present

## 2013-05-08 DIAGNOSIS — H2510 Age-related nuclear cataract, unspecified eye: Secondary | ICD-10-CM | POA: Diagnosis not present

## 2013-05-08 DIAGNOSIS — H5200 Hypermetropia, unspecified eye: Secondary | ICD-10-CM | POA: Diagnosis not present

## 2013-05-08 DIAGNOSIS — H40059 Ocular hypertension, unspecified eye: Secondary | ICD-10-CM | POA: Diagnosis not present

## 2013-05-15 ENCOUNTER — Encounter: Payer: Self-pay | Admitting: Family Medicine

## 2013-05-18 ENCOUNTER — Other Ambulatory Visit: Payer: Self-pay

## 2013-06-07 DIAGNOSIS — Z79899 Other long term (current) drug therapy: Secondary | ICD-10-CM | POA: Diagnosis not present

## 2013-06-07 DIAGNOSIS — R198 Other specified symptoms and signs involving the digestive system and abdomen: Secondary | ICD-10-CM | POA: Diagnosis not present

## 2013-06-07 DIAGNOSIS — K219 Gastro-esophageal reflux disease without esophagitis: Secondary | ICD-10-CM | POA: Diagnosis not present

## 2013-06-15 DIAGNOSIS — H6120 Impacted cerumen, unspecified ear: Secondary | ICD-10-CM | POA: Diagnosis not present

## 2013-06-15 DIAGNOSIS — H60399 Other infective otitis externa, unspecified ear: Secondary | ICD-10-CM | POA: Diagnosis not present

## 2013-06-20 ENCOUNTER — Other Ambulatory Visit: Payer: Self-pay | Admitting: Family Medicine

## 2013-06-20 MED ORDER — HYDROCODONE-ACETAMINOPHEN 5-325 MG PO TABS: 1.0000 | ORAL_TABLET | Freq: Three times a day (TID) | ORAL | Status: DC | PRN

## 2013-06-20 NOTE — Telephone Encounter (Signed)
Mr Chatwin can pick up the RX at the front desk. It is available now. For future refills, just let me know--I would use PHONE right  Now as My Chart is having some problems THANKS! Dorcas Mcmurray

## 2013-07-18 ENCOUNTER — Other Ambulatory Visit: Payer: Self-pay | Admitting: Family Medicine

## 2013-07-19 ENCOUNTER — Ambulatory Visit: Payer: Medicare Other | Admitting: Family Medicine

## 2013-08-15 ENCOUNTER — Other Ambulatory Visit: Payer: Self-pay | Admitting: Family Medicine

## 2013-08-15 DIAGNOSIS — R079 Chest pain, unspecified: Secondary | ICD-10-CM | POA: Diagnosis not present

## 2013-09-15 ENCOUNTER — Emergency Department (HOSPITAL_BASED_OUTPATIENT_CLINIC_OR_DEPARTMENT_OTHER): Payer: Medicare Other

## 2013-09-15 ENCOUNTER — Observation Stay (HOSPITAL_BASED_OUTPATIENT_CLINIC_OR_DEPARTMENT_OTHER)
Admission: EM | Admit: 2013-09-15 | Discharge: 2013-09-16 | Disposition: A | Discharge status: Home / Self Care | Payer: Medicare Other | Attending: Family Medicine | Admitting: Family Medicine

## 2013-09-15 ENCOUNTER — Encounter (HOSPITAL_BASED_OUTPATIENT_CLINIC_OR_DEPARTMENT_OTHER): Payer: Self-pay | Admitting: Emergency Medicine

## 2013-09-15 ENCOUNTER — Encounter (HOSPITAL_COMMUNITY)
Admission: EM | Disposition: A | Discharge status: Home / Self Care | Payer: Medicare Other | Source: Home / Self Care | Attending: Emergency Medicine

## 2013-09-15 DIAGNOSIS — H9319 Tinnitus, unspecified ear: Secondary | ICD-10-CM | POA: Insufficient documentation

## 2013-09-15 DIAGNOSIS — R5381 Other malaise: Secondary | ICD-10-CM | POA: Diagnosis not present

## 2013-09-15 DIAGNOSIS — K219 Gastro-esophageal reflux disease without esophagitis: Secondary | ICD-10-CM | POA: Insufficient documentation

## 2013-09-15 DIAGNOSIS — E785 Hyperlipidemia, unspecified: Secondary | ICD-10-CM | POA: Diagnosis not present

## 2013-09-15 DIAGNOSIS — M25519 Pain in unspecified shoulder: Secondary | ICD-10-CM | POA: Insufficient documentation

## 2013-09-15 DIAGNOSIS — Z87891 Personal history of nicotine dependence: Secondary | ICD-10-CM | POA: Insufficient documentation

## 2013-09-15 DIAGNOSIS — K449 Diaphragmatic hernia without obstruction or gangrene: Secondary | ICD-10-CM | POA: Diagnosis not present

## 2013-09-15 DIAGNOSIS — Z6831 Body mass index (BMI) 31.0-31.9, adult: Secondary | ICD-10-CM | POA: Insufficient documentation

## 2013-09-15 DIAGNOSIS — E669 Obesity, unspecified: Secondary | ICD-10-CM | POA: Diagnosis not present

## 2013-09-15 DIAGNOSIS — Z8546 Personal history of malignant neoplasm of prostate: Secondary | ICD-10-CM | POA: Insufficient documentation

## 2013-09-15 DIAGNOSIS — R079 Chest pain, unspecified: Secondary | ICD-10-CM | POA: Diagnosis present

## 2013-09-15 DIAGNOSIS — I872 Venous insufficiency (chronic) (peripheral): Secondary | ICD-10-CM | POA: Insufficient documentation

## 2013-09-15 DIAGNOSIS — I200 Unstable angina: Secondary | ICD-10-CM | POA: Diagnosis not present

## 2013-09-15 DIAGNOSIS — R0602 Shortness of breath: Secondary | ICD-10-CM | POA: Diagnosis not present

## 2013-09-15 DIAGNOSIS — Z7982 Long term (current) use of aspirin: Secondary | ICD-10-CM | POA: Diagnosis not present

## 2013-09-15 DIAGNOSIS — R5383 Other fatigue: Secondary | ICD-10-CM

## 2013-09-15 DIAGNOSIS — F411 Generalized anxiety disorder: Secondary | ICD-10-CM | POA: Diagnosis not present

## 2013-09-15 DIAGNOSIS — I10 Essential (primary) hypertension: Secondary | ICD-10-CM | POA: Diagnosis not present

## 2013-09-15 DIAGNOSIS — I208 Other forms of angina pectoris: Secondary | ICD-10-CM

## 2013-09-15 DIAGNOSIS — G4700 Insomnia, unspecified: Secondary | ICD-10-CM | POA: Insufficient documentation

## 2013-09-15 DIAGNOSIS — K5730 Diverticulosis of large intestine without perforation or abscess without bleeding: Secondary | ICD-10-CM | POA: Insufficient documentation

## 2013-09-15 DIAGNOSIS — R072 Precordial pain: Secondary | ICD-10-CM | POA: Diagnosis not present

## 2013-09-15 DIAGNOSIS — Z79899 Other long term (current) drug therapy: Secondary | ICD-10-CM | POA: Diagnosis not present

## 2013-09-15 DIAGNOSIS — I2510 Atherosclerotic heart disease of native coronary artery without angina pectoris: Secondary | ICD-10-CM | POA: Insufficient documentation

## 2013-09-15 DIAGNOSIS — I209 Angina pectoris, unspecified: Secondary | ICD-10-CM

## 2013-09-15 HISTORY — DX: Adverse effect of unspecified anesthetic, initial encounter: T41.45XA

## 2013-09-15 HISTORY — DX: Atherosclerotic heart disease of native coronary artery without angina pectoris: I25.10

## 2013-09-15 HISTORY — PX: LEFT HEART CATHETERIZATION WITH CORONARY ANGIOGRAM: SHX5451

## 2013-09-15 HISTORY — DX: Gastro-esophageal reflux disease without esophagitis: K21.9

## 2013-09-15 HISTORY — DX: Shortness of breath: R06.02

## 2013-09-15 HISTORY — DX: Essential (primary) hypertension: I10

## 2013-09-15 HISTORY — DX: Other complications of anesthesia, initial encounter: T88.59XA

## 2013-09-15 HISTORY — DX: Hyperlipidemia, unspecified: E78.5

## 2013-09-15 HISTORY — DX: Unspecified osteoarthritis, unspecified site: M19.90

## 2013-09-15 HISTORY — DX: Transient cerebral ischemic attack, unspecified: G45.9

## 2013-09-15 HISTORY — DX: Angina pectoris, unspecified: I20.9

## 2013-09-15 HISTORY — DX: Family history of other specified conditions: Z84.89

## 2013-09-15 HISTORY — DX: Unspecified asthma, uncomplicated: J45.909

## 2013-09-15 HISTORY — DX: Anxiety disorder, unspecified: F41.9

## 2013-09-15 HISTORY — DX: Personal history of other diseases of the digestive system: Z87.19

## 2013-09-15 LAB — BASIC METABOLIC PANEL
BUN: 18 mg/dL (ref 6–23)
CO2: 29 mEq/L (ref 19–32)
Calcium: 9.5 mg/dL (ref 8.4–10.5)
Chloride: 101 mEq/L (ref 96–112)
Creatinine, Ser: 1.2 mg/dL (ref 0.50–1.35)
GFR calc non Af Amer: 56 mL/min — ABNORMAL LOW (ref >90)
GFR, EST AFRICAN AMERICAN: 65 mL/min — AB (ref >90)
Glucose, Bld: 112 mg/dL — ABNORMAL HIGH (ref 70–99)
POTASSIUM: 3.6 meq/L — AB (ref 3.7–5.3)
Sodium: 143 mEq/L (ref 137–147)

## 2013-09-15 LAB — CBC WITH DIFFERENTIAL/PLATELET
Basophils Absolute: 0.1 10*3/uL (ref 0.0–0.1)
Basophils Relative: 2 % — ABNORMAL HIGH (ref 0–1)
EOS PCT: 9 % — AB (ref 0–5)
Eosinophils Absolute: 0.5 10*3/uL (ref 0.0–0.7)
HCT: 39.9 % (ref 39.0–52.0)
Hemoglobin: 14.4 g/dL (ref 13.0–17.0)
LYMPHS ABS: 1.9 10*3/uL (ref 0.7–4.0)
LYMPHS PCT: 33 % (ref 12–46)
MCH: 35.2 pg — ABNORMAL HIGH (ref 26.0–34.0)
MCHC: 36.1 g/dL — ABNORMAL HIGH (ref 30.0–36.0)
MCV: 97.6 fL (ref 78.0–100.0)
Monocytes Absolute: 0.8 10*3/uL (ref 0.1–1.0)
Monocytes Relative: 13 % — ABNORMAL HIGH (ref 3–12)
Neutro Abs: 2.6 10*3/uL (ref 1.7–7.7)
Neutrophils Relative %: 43 % (ref 43–77)
PLATELETS: 127 10*3/uL — AB (ref 150–400)
RBC: 4.09 MIL/uL — AB (ref 4.22–5.81)
RDW: 13 % (ref 11.5–15.5)
WBC: 5.9 10*3/uL (ref 4.0–10.5)

## 2013-09-15 LAB — TROPONIN I

## 2013-09-15 LAB — POCT ACTIVATED CLOTTING TIME: ACTIVATED CLOTTING TIME: 243 s

## 2013-09-15 LAB — PROTIME-INR
INR: 1.11 (ref 0.00–1.49)
Prothrombin Time: 14.1 seconds (ref 11.6–15.2)

## 2013-09-15 LAB — PRO B NATRIURETIC PEPTIDE: Pro B Natriuretic peptide (BNP): 188.4 pg/mL (ref 0–450)

## 2013-09-15 SURGERY — LEFT HEART CATHETERIZATION WITH CORONARY ANGIOGRAM
Anesthesia: LOCAL

## 2013-09-15 MED ORDER — ADENOSINE 12 MG/4ML IV SOLN
16.0000 mL | Freq: Once | INTRAVENOUS | Status: DC
  Filled 2013-09-15: qty 16

## 2013-09-15 MED ORDER — BACLOFEN 5 MG HALF TABLET
5.0000 mg | ORAL_TABLET | Freq: Two times a day (BID) | ORAL | Status: DC | PRN
  Filled 2013-09-15: qty 1

## 2013-09-15 MED ORDER — METOPROLOL TARTRATE 12.5 MG HALF TABLET
12.5000 mg | ORAL_TABLET | Freq: Two times a day (BID) | ORAL | Status: DC
  Administered 2013-09-15 (×2): 12.5 mg via ORAL
  Filled 2013-09-15 (×6): qty 1

## 2013-09-15 MED ORDER — SODIUM CHLORIDE 0.9 % IV SOLN
INTRAVENOUS | Status: DC
  Administered 2013-09-15: 11:00:00 via INTRAVENOUS

## 2013-09-15 MED ORDER — ATORVASTATIN CALCIUM 40 MG PO TABS
40.0000 mg | ORAL_TABLET | Freq: Every day | ORAL | Status: DC
  Administered 2013-09-15 – 2013-09-16 (×2): 40 mg via ORAL
  Filled 2013-09-15 (×3): qty 1

## 2013-09-15 MED ORDER — HYDROCHLOROTHIAZIDE 12.5 MG PO CAPS
12.5000 mg | ORAL_CAPSULE | Freq: Every day | ORAL | Status: DC
Start: 2013-09-15 — End: 2013-09-16
  Administered 2013-09-15 – 2013-09-16 (×2): 12.5 mg via ORAL
  Filled 2013-09-15 (×3): qty 1

## 2013-09-15 MED ORDER — FENTANYL CITRATE 0.05 MG/ML IJ SOLN
INTRAMUSCULAR | Status: AC
  Filled 2013-09-15: qty 2

## 2013-09-15 MED ORDER — MIDAZOLAM HCL 2 MG/2ML IJ SOLN
INTRAMUSCULAR | Status: AC
  Filled 2013-09-15: qty 2

## 2013-09-15 MED ORDER — ASPIRIN 81 MG PO CHEW: 81.0000 mg | CHEWABLE_TABLET | ORAL | Status: DC

## 2013-09-15 MED ORDER — SODIUM CHLORIDE 0.9 % IJ SOLN: 3.0000 mL | INTRAMUSCULAR | Status: DC | PRN

## 2013-09-15 MED ORDER — ALBUTEROL SULFATE (2.5 MG/3ML) 0.083% IN NEBU: 3.0000 mL | INHALATION_SOLUTION | Freq: Four times a day (QID) | RESPIRATORY_TRACT | Status: DC | PRN

## 2013-09-15 MED ORDER — VERAPAMIL HCL 2.5 MG/ML IV SOLN
INTRAVENOUS | Status: AC
  Filled 2013-09-15: qty 2

## 2013-09-15 MED ORDER — SODIUM CHLORIDE 0.9 % IV SOLN: 250.0000 mL | INTRAVENOUS | Status: DC | PRN

## 2013-09-15 MED ORDER — HEPARIN SODIUM (PORCINE) 5000 UNIT/ML IJ SOLN
5000.0000 [IU] | Freq: Three times a day (TID) | INTRAMUSCULAR | Status: DC
  Filled 2013-09-15 (×4): qty 1

## 2013-09-15 MED ORDER — NITROGLYCERIN 0.4 MG/SPRAY TL SOLN
2.0000 | Status: DC | PRN
  Filled 2013-09-15: qty 4.9

## 2013-09-15 MED ORDER — HEPARIN SODIUM (PORCINE) 1000 UNIT/ML IJ SOLN
INTRAMUSCULAR | Status: AC
  Filled 2013-09-15: qty 1

## 2013-09-15 MED ORDER — NITROGLYCERIN 0.2 MG/ML ON CALL CATH LAB
INTRAVENOUS | Status: AC
  Filled 2013-09-15: qty 1

## 2013-09-15 MED ORDER — CLONAZEPAM 0.5 MG PO TABS
0.5000 mg | ORAL_TABLET | Freq: Every day | ORAL | Status: DC
  Administered 2013-09-15: 0.5 mg via ORAL
  Filled 2013-09-15: qty 1

## 2013-09-15 MED ORDER — NITROGLYCERIN 2 % TD OINT
1.0000 [in_us] | TOPICAL_OINTMENT | Freq: Four times a day (QID) | TRANSDERMAL | Status: DC
  Administered 2013-09-15 (×2): 1 [in_us] via TOPICAL
  Filled 2013-09-15: qty 1
  Filled 2013-09-15: qty 30

## 2013-09-15 MED ORDER — CLONAZEPAM 0.5 MG PO TABS: 0.2500 mg | ORAL_TABLET | Freq: Every day | ORAL | Status: DC | PRN

## 2013-09-15 MED ORDER — HEPARIN (PORCINE) IN NACL 2-0.9 UNIT/ML-% IJ SOLN
INTRAMUSCULAR | Status: AC
  Filled 2013-09-15: qty 1000

## 2013-09-15 MED ORDER — LIDOCAINE HCL (PF) 1 % IJ SOLN
INTRAMUSCULAR | Status: AC
  Filled 2013-09-15: qty 30

## 2013-09-15 MED ORDER — NITROGLYCERIN 0.4 MG SL SUBL: 0.4000 mg | SUBLINGUAL_TABLET | SUBLINGUAL | Status: DC | PRN

## 2013-09-15 MED ORDER — LORATADINE 10 MG PO TABS
10.0000 mg | ORAL_TABLET | Freq: Every day | ORAL | Status: DC
  Administered 2013-09-15 – 2013-09-16 (×2): 10 mg via ORAL
  Filled 2013-09-15 (×2): qty 1

## 2013-09-15 MED ORDER — GI COCKTAIL ~~LOC~~: 30.0000 mL | Freq: Four times a day (QID) | ORAL | Status: DC | PRN

## 2013-09-15 MED ORDER — SODIUM CHLORIDE 0.9 % IV SOLN: 1.0000 mL/kg/h | INTRAVENOUS | Status: AC

## 2013-09-15 MED ORDER — HYDROCODONE-ACETAMINOPHEN 5-325 MG PO TABS
0.5000 | ORAL_TABLET | Freq: Three times a day (TID) | ORAL | Status: DC | PRN
  Administered 2013-09-15: 0.5 via ORAL
  Filled 2013-09-15: qty 1

## 2013-09-15 MED ORDER — AMLODIPINE BESYLATE 5 MG PO TABS
5.0000 mg | ORAL_TABLET | Freq: Every day | ORAL | Status: DC
  Administered 2013-09-15 – 2013-09-16 (×2): 5 mg via ORAL
  Filled 2013-09-15 (×3): qty 1

## 2013-09-15 MED ORDER — SODIUM CHLORIDE 0.9 % IJ SOLN
3.0000 mL | Freq: Two times a day (BID) | INTRAMUSCULAR | Status: DC
  Administered 2013-09-15: 3 mL via INTRAVENOUS

## 2013-09-15 MED ORDER — SODIUM CHLORIDE 0.45 % IV SOLN: INTRAVENOUS | Status: DC

## 2013-09-15 MED ORDER — ASPIRIN 325 MG PO TABS
325.0000 mg | ORAL_TABLET | Freq: Every day | ORAL | Status: DC
  Administered 2013-09-15 – 2013-09-16 (×2): 325 mg via ORAL
  Filled 2013-09-15 (×3): qty 1

## 2013-09-15 MED ORDER — MORPHINE SULFATE 2 MG/ML IJ SOLN: 2.0000 mg | INTRAMUSCULAR | Status: DC | PRN

## 2013-09-15 NOTE — ED Notes (Signed)
Pt placed on heart monitor.

## 2013-09-15 NOTE — ED Notes (Signed)
Pt reports that he was awoken from sleep by pain in central chest, associated with n/v, diaphoresis, got up and took 1 sl nitro spray and 1 asa , unsure if medication help pain or not. No pain at this time

## 2013-09-15 NOTE — Interval H&P Note (Signed)
History and Physical Interval Note:  09/15/2013 4:49 PM  [name]  has presented today for surgery, with the diagnosis of cp  The various methods of treatment have been discussed with the patient and family. After consideration of risks, benefits and other options for treatment, the patient has consented to  Procedure(s): LEFT HEART CATHETERIZATION WITH CORONARY ANGIOGRAM (N/A) as a surgical intervention .  The patient's history has been reviewed, patient examined, no change in status, stable for surgery.  I have reviewed the patient's chart and labs.  Questions were answered to the patient's satisfaction.   Cath Lab Visit (complete for each Cath Lab visit)  Clinical Evaluation Leading to the Procedure:   ACS: yes  Non-ACS:    Anginal Classification: CCS III  Anti-ischemic medical therapy: Maximal Therapy (2 or more classes of medications)  Non-Invasive Test Results: No non-invasive testing performed  Prior CABG: No previous CABG        Chelsea Primus 09/15/2013 4:49 PM

## 2013-09-15 NOTE — Evaluation (Signed)
Physical Therapy Evaluation Patient Details Name: [name] MRN: [id number] DOB: [birth year] Today's Date: 09/15/2013 Time: [id number] PT Time Calculation (min): 30 min  PT Assessment / Plan / Recommendation History of Present Illness  80 y.o. male admitted to [facility] on 09/15/13 with chest pain.  PMX of CAD,HTN,HLD, Prostate CA.  He is to undergo cardiac cath today 09/15/13.    Clinical Impression  Pt is very mobile, moving independently. I had a long discussion with him and his wife about the importance of continued activity during the winter months.  He agreed that he is at a point where this is necessary.  He has no acute PT or OT needs at this time and has no f/u PT/OT needs at this time.  I encouraged him to see an orthopedist not just his family physician about his left shoulder pain to see what conservative tx options were available.      PT Assessment  Patent does not need any further PT services (pt also does not have any OT needs at this time)    Follow Up Recommendations  No PT follow up    Does the patient have the potential to tolerate intense rehabilitation     NA  Barriers to Discharge   None      Equipment Recommendations  None recommended by PT    Recommendations for Other Services   None  Frequency   NA- One time eval and d/c   Precautions / Restrictions   None  Pertinent Vitals/Pain See vitals flow sheet. One small run of v-tach.  Pt asymptomatic HR and O2 sats stable with gait.  No DOE.        Mobility  Bed Mobility Overal bed mobility: Independent Transfers Overall transfer level: Independent Ambulation/Gait Ambulation/Gait assistance: Independent Ambulation Distance (Feet): 200 Feet Gait velocity interpretation: at or above normal speed for age/gender Stairs: Yes Stairs assistance: Modified independent (Device/Increase time) Stair Management: One rail Right;Alternating pattern;Forwards Number of Stairs: 9       PT Goals(Current goals can be found in  the care plan section) Acute Rehab PT Goals PT Goal Formulation: No goals set, d/c therapy  Visit Information  Last PT Received On: 09/15/13 Assistance Needed: +1 History of Present Illness: 80 y.o. male admitted to [facility] on 09/15/13 with chest pain.  PMX of CAD,HTN,HLD, Prostate CA.  He is to undergo cardiac cath today 09/15/13.         Prior [place] expects to be discharged to:: Private residence Living Arrangements: Spouse/significant other Available Help at Discharge: Family;Available 24 hours/day Type of Home: House Home Access: Stairs to enter [employer] of Steps: 1/2 step Entrance Stairs-Rails: None Home Layout: Two level Alternate Level Stairs-Number of Steps: 9 Alternate Level Stairs-Rails: Right Home Equipment: Cane - single point Prior Function Level of Independence: Independent Comments: driving Communication Communication: HOH (bil hearing aids) Dominant Hand: Right    Cognition  Cognition Arousal/Alertness: Awake/alert Behavior During Therapy: WFL for tasks assessed/performed Overall Cognitive Status: Within Functional Limits for tasks assessed Memory: Decreased short-term memory (per pt self report, very self aware)    Extremity/Trunk Assessment Upper Extremity Assessment Upper Extremity Assessment: LUE deficits/detail LUE Deficits / Details: decreased overhead ROM due to "arthritis" and "calcification" Lower Extremity Assessment Lower Extremity Assessment: Overall WFL for tasks assessed (bil LE edema) Cervical / Trunk Assessment Cervical / Trunk Assessment: Normal   Balance Balance Overall balance assessment: No apparent balance deficits (not formally assessed) General Comments General comments (skin  integrity, edema, etc.): Long discussion about continued activity, left shoulder, need to be active in the winter time, yoga/thai chi/ stretching.  Pt is very mobilie and seems to have some mild balance deficits  appropriate for his age.  Increasing his winter time activity level will help maintain strength, flexibility, and balance.    End of Session PT - End of Session Activity Tolerance: Patient tolerated treatment well Patient left: in bed;with call bell/phone within reach;with family/visitor present Nurse Communication: Mobility status    Wells Guiles B. McMinn, [place], DPT [phone]   09/15/2013, 11:12 AM

## 2013-09-15 NOTE — ED Notes (Signed)
Pt arrived pov w waking from sleep w chest pain, nausea sweating and sob,  Took 2 ntg w some relief   At present denies cp states has chest soreness

## 2013-09-15 NOTE — CV Procedure (Signed)
    Cardiac Catheterization Procedure Note  Name: [name] MRN: [id number] DOB: [birth year]  Procedure: Left Heart Cath, Selective Coronary Angiography, LV angiography, FFR assessment of the proximal RCA.  Indication: 80 yo WM with history of HTN, HL presents with symptoms of chest pain.    Procedural Details: The right wrist was prepped, draped, and anesthetized with 1% lidocaine. Using the modified Seldinger technique, a 6 French sheath was introduced into the right radial artery. 3 mg of verapamil was administered through the sheath, weight-based unfractionated heparin was administered intravenously. Standard Judkins catheters were used for selective coronary angiography and left ventriculography. Catheter exchanges were performed over an exchange length guidewire. There were no immediate procedural complications. A TR band was used for radial hemostasis at the completion of the procedure.  The patient was transferred to the post catheterization recovery area for further monitoring.  Procedural Findings: Hemodynamics: AO 143/62 mean 98 mm Hg LV 148/23 mm Hg  Coronary angiography: Coronary dominance: right  Left mainstem: Normal.  Left anterior descending (LAD):  The LAD has mild calcification in the proximal to mid vessel. There is diffuse mild disease in the proximal vessel up to 20%.  Left circumflex (LCx): The LCx is a large vessel. There is a 30% stenosis at the origin of a large OM. Otherwise no significant disease.  Right coronary artery (RCA): The RCA is a large dominant vessel. It has moderate calcification in the proximal and mid vessel. In the proximal vessel there is a focal stenosis of 70%. The mid vessel has disease up to 30% and there are diffuse irregularities in the distal vessel.   Left ventriculography: Left ventricular systolic function is normal, LVEF is estimated at 55-65%, there is no significant mitral regurgitation   At this point we proceeded with FFR  analysis of the RCA. The patient was heparinized. Once a therapeutic ACT was obtained an FR4 quide was inserted. A flow wire was passed to the mid RCA across the proximal lesion. Maximal hyperemia was achieved with IV Adenosine. FFR was measured at 0.82.  Final Conclusions:   1. CAD with moderate proximal RCA stenosis. FFR of 0.82. 2. Normal LV function.  Recommendations: FFR analysis suggests that the RCA stenosis is not significantly flow limiting. I recommend medical therapy with risk factor modification. It is unlikely that this lesion is responsible for his current chest pain.  [name] [facility] 09/15/2013, 5:24 PM

## 2013-09-15 NOTE — Progress Notes (Signed)
TR band removed and dressing applied. No evidence of bleeding, VSS.

## 2013-09-15 NOTE — Consult Note (Signed)
Primary Cardiologist: [facility] Reason for Consultation: CP/[place]   HPI:  80 y/o with HTN, obesity, PAD s/p CEA, GERD, gallstones s/p cholecystectomy presents with CP.   Says he used to see Dr. Acie Fredrickson regularly has had several caths in past last 2008 which showed mild CAD. Treated medically. Has not followed up in at least 4 years.  Says he has frequent CP take NTG spray almost daily. Over last 6 months has gotten worse. Now CP every time he walks up hill. Has gained 35 pounds over the winter.  Woke up at 230a with severe CP, sweaty and sob. Came to ER. CP resolved with NTG. ECG and troponin normal. Now CP free except for soreness across diaphragm.    Review of Systems:     Cardiac Review of Systems: {Y] = yes [ ]  = no  Chest Pain [  y  ]  Resting SOB [   ] Exertional SOB  [  y]  Orthopnea [  ]   Pedal Edema [   ]    Palpitations [  ] Syncope  [  ]   Presyncope [   ]  General Review of Systems: [Y] = yes [  ]=no Constitional: recent weight change [  ]; anorexia [  ]; fatigue [  ]; nausea [  ]; night sweats [  ]; fever [  ]; or chills [  ];                                                                      Eyes : blurred vision [  ]; diplopia [   ]; vision changes [  ];  Amaurosis fugax[  ]; Resp: cough [  ];  wheezing[  ];  hemoptysis[  ];  PND [  ];  GI:  gallstones[  y], vomiting[  ];  dysphagia[  ]; melena[  ];  hematochezia [  ]; heartburn[  ];   GU: kidney stones [  ]; hematuria[  ];   dysuria [  ];  nocturia[  ]; incontinence [  ];             Skin: rash, swelling[  ];, hair loss[  ];  peripheral edema[  ];  or itching[  ]; Musculosketetal: myalgias[  ];  joint swelling[  ];  joint erythema[  ];  joint pain[ y ];  back pain[ y ];  Heme/Lymph: bruising[  ];  bleeding[  ];  anemia[  ];  Neuro: TIA[  ];  headaches[  ];  stroke[  ];  vertigo[  ];  seizures[  ];   paresthesias[  ];  difficulty walking[  ];  Psych:depression[  ]; anxiety[  ];  Endocrine: diabetes[  ];  thyroid  dysfunction[  ];  Other:  Past Medical History  Diagnosis Date  . Diverticulitis     recurrent  . Tinnitus   . Hearing aid worn   . Kidney stone     lithotripsy [id number] w complications, req stents, Dr Rosana Hoes  . Prostate ca     Medications Prior to Admission  Medication Sig Dispense Refill  . amLODipine (NORVASC) 5 MG tablet TAKE 1 TABLET (5 MG TOTAL) BY MOUTH DAILY.  90 tablet  3  . atorvastatin (  LIPITOR) 10 MG tablet TAKE 1 TABLET (10 MG TOTAL) BY MOUTH DAILY.  90 tablet  3  . baclofen (LIORESAL) 5 mg TABS Take 0.5 tablets (5 mg total) by mouth 2 (two) times daily as needed.  30 tablet  0  . clonazePAM (KLONOPIN) 0.5 MG tablet Take 1 tablet (0.5 mg total) by mouth as directed. Take 1 tablet by mouth at bedtime and 1/2 tab by mouth once daily as needed  30 tablet  5  . hydrochlorothiazide (MICROZIDE) 12.5 MG capsule TAKE 1 CAPSULE (12.5 MG TOTAL) BY MOUTH DAILY.  30 capsule  3  . nitroGLYCERIN (NITROLINGUAL) 0.4 MG/SPRAY spray PLACE 1 SPRAY UNDER THE TONGUE AS NEEDED. FOR EXERTIONAL CHEST PAIN  4.9 g  0  . omeprazole (PRILOSEC) 20 MG capsule TAKE 1 CAPSULE (20 MG TOTAL) BY MOUTH DAILY.  90 capsule  3  . albuterol (PROVENTIL HFA) 108 (90 BASE) MCG/ACT inhaler Inhale 2 puffs into the lungs 4 (four) times daily as needed.        Marland Kitchen aspirin (BAYER ASPIRIN) 325 MG tablet Take 325 mg by mouth daily.        . cetirizine (ZYRTEC) 10 MG tablet Take 10 mg by mouth daily. As needed for allergies       . HYDROcodone-acetaminophen (NORCO/VICODIN) 5-325 MG per tablet Take 1 tablet by mouth every 8 (eight) hours as needed.  90 tablet  0  . Polyethylene Glycol 3350 (BASE B POLYETHYLENE GLYCOL) POWD 17 grams of powder mixed with water every other day. Disp qs for 3 months  2500 g  3  . triamcinolone (KENALOG) 0.1 % lotion Apply topically.           Marland Kitchen amLODipine  5 mg Oral Daily  . aspirin  325 mg Oral Daily  . atorvastatin  40 mg Oral Daily  . clonazePAM  0.5 mg Oral QHS  . heparin  5,000 Units  Subcutaneous 3 times per day  . hydrochlorothiazide  12.5 mg Oral Daily  . metoprolol tartrate  12.5 mg Oral BID  . nitroGLYCERIN  1 inch Topical 4 times per day    Infusions: . sodium chloride      Allergies  Allergen Reactions  . Sulfonamide Derivatives     History   Social History  . Marital Status: Married    Spouse Name: N/A    Number of Children: N/A  . Years of Education: N/A   Occupational History  . Not on file.   Social History Main Topics  . Smoking status: Former Smoker -- 0.50 packs/day    Types: Cigarettes    Quit date: 08/28/1996  . Smokeless tobacco: Not on file  . Alcohol Use: 1.0 oz/week    2 drink(s) per week  . Drug Use: No  . Sexual Activity: Yes    Birth Control/ Protection: None   Other Topics Concern  . Not on file   Social History Narrative   Lives with wife--recently diagnosed with breast ca; No smoking; Occassional wine; Retired; 2 daughters live in [place]--5 grandchildren(boys). On weight watchers. Lives in [place] with wife. Retired Solicitor. Tobacco history 2 ppd x 32 years. No smoking x 25 years. No drugs.    History reviewed. No pertinent family history.  PHYSICAL EXAM: Filed Vitals:   09/15/13 0634  BP: 149/67  Pulse: 60  Temp: 97.7 F (36.5 C)  Resp: 16    No intake or output data in the 24 hours ending 09/15/13 0814  General:  Well  appearing. No respiratory difficulty HEENT: normal Neck: supple. no JVD. Carotids 2+ bilat; no bruits. No lymphadenopathy or thryomegaly appreciated. L CEA scar Cor: PMI nondisplaced. Regular rate & rhythm. No rubs, gallops or murmurs. Lungs: clear Abdomen: obese  soft, nontender, nondistended. No hepatosplenomegaly. No bruits or masses. Good bowel sounds. Extremities: no cyanosis, clubbing, rash, Tr-1+ edema L>R Neuro: alert & oriented x 3, cranial nerves grossly intact. moves all 4 extremities w/o difficulty. Affect pleasant.  ECG: SB 58 No ST-T wave abnormalities.     Results for orders placed during the hospital encounter of 09/15/13 (from the past 24 hour(s))  CBC WITH DIFFERENTIAL     Status: Abnormal   Collection Time    09/15/13  3:45 AM      Result Value Ref Range   WBC 5.9  4.0 - 10.5 K/uL   RBC 4.09 (*) 4.22 - 5.81 MIL/uL   Hemoglobin 14.4  13.0 - 17.0 g/dL   HCT 39.9  39.0 - 52.0 %   MCV 97.6  78.0 - 100.0 fL   MCH 35.2 (*) 26.0 - 34.0 pg   MCHC 36.1 (*) 30.0 - 36.0 g/dL   RDW 13.0  11.5 - 15.5 %   Platelets 127 (*) 150 - 400 K/uL   Neutrophils Relative % 43  43 - 77 %   Lymphocytes Relative 33  12 - 46 %   Monocytes Relative 13 (*) 3 - 12 %   Eosinophils Relative 9 (*) 0 - 5 %   Basophils Relative 2 (*) 0 - 1 %   Neutro Abs 2.6  1.7 - 7.7 K/uL   Lymphs Abs 1.9  0.7 - 4.0 K/uL   Monocytes Absolute 0.8  0.1 - 1.0 K/uL   Eosinophils Absolute 0.5  0.0 - 0.7 K/uL   Basophils Absolute 0.1  0.0 - 0.1 K/uL   WBC Morphology WHITE COUNT CONFIRMED ON SMEAR     Smear Review LARGE PLATELETS PRESENT    BASIC METABOLIC PANEL     Status: Abnormal   Collection Time    09/15/13  3:45 AM      Result Value Ref Range   Sodium 143  137 - 147 mEq/L   Potassium 3.6 (*) 3.7 - 5.3 mEq/L   Chloride 101  96 - 112 mEq/L   CO2 29  19 - 32 mEq/L   Glucose, Bld 112 (*) 70 - 99 mg/dL   BUN 18  6 - 23 mg/dL   Creatinine, Ser 1.20  0.50 - 1.35 mg/dL   Calcium 9.5  8.4 - 10.5 mg/dL   GFR calc non Af Amer 56 (*) >90 mL/min   GFR calc Af Amer 65 (*) >90 mL/min  TROPONIN I     Status: None   Collection Time    09/15/13  3:45 AM      Result Value Ref Range   Troponin I <0.30  <0.30 ng/mL  PRO B NATRIURETIC PEPTIDE     Status: None   Collection Time    09/15/13  3:45 AM      Result Value Ref Range   Pro B Natriuretic peptide (BNP) 188.4  0 - 450 pg/mL   Dg Chest Portable 1 View  09/15/2013   CLINICAL DATA:  Followup quit chest pain. Shortness breath. History of prostate cancer.  EXAM: PORTABLE CHEST - 1 VIEW  COMPARISON:  08/15/2013 and 12/18/2009.   FINDINGS: Central pulmonary vascular prominence stable without evidence of pulmonary edema, segmental infiltrate or pneumothorax.  Mildly tortuous  aorta.  Heart size top-normal.  No osseous lesion noted.  IMPRESSION: Central pulmonary vascular prominence stable without evidence of pulmonary edema, segmental infiltrate or pneumothorax.   Electronically Signed   By: Chauncey Cruel M.D.   On: 09/15/2013 04:24     ASSESSMENT: 1. CP/[place] 2. Mild CAD on cath 2008 3. Carotid stenosis s/p L CEA   PLAN/DISCUSSION:  Symptoms concerning for [place]. Will proceed with cath later today. Keep NPO. Treat with ASA, b-blocker, statin.   Daniel Bensimhon,MD 8:21 AM

## 2013-09-15 NOTE — Progress Notes (Addendum)
Patient expressed concern over receiving a stent during his cardiac catheterization.  Patient is OK with proceeding with cath just does not want a stent at this time, if warranted.  [name] from cath lab notified.  Will continue to monitor. [name], [name]  Patient comfortable signing consent at this time.  Patient notified that MD would still come to discuss cath with him. [place], [name]

## 2013-09-15 NOTE — ED Provider Notes (Signed)
CSN: [id number]     Arrival date & time 09/15/13  [id number] History   First MD Initiated Contact with Patient 09/15/13 [phone]     Chief Complaint  Patient presents with  . Chest Pain     (Consider location/radiation/quality/duration/timing/severity/associated sxs/prior Treatment) Patient is a 80 y.o. male presenting with chest pain. The history is provided by the patient. No language interpreter was used.  Chest Pain Pain location:  Substernal area Pain quality: radiating   Pain radiates to:  L shoulder Pain radiates to the back: no   Pain severity:  Moderate Onset quality:  Sudden Timing:  Constant Progression:  Resolved Chronicity:  New Context: at rest   Relieved by:  Nothing Worsened by:  Nothing tried Ineffective treatments:  None tried Associated symptoms: diaphoresis, nausea and shortness of breath   Risk factors: high cholesterol, hypertension and male sex     Past Medical History  Diagnosis Date  . Diverticulitis     recurrent  . Tinnitus   . Hearing aid worn   . Kidney stone     lithotripsy [id number] w complications, req stents, Dr Rosana Hoes  . Prostate ca    Past Surgical History  Procedure Laterality Date  . Carotid endarterectomy  RIGHT    1998  . Prostatectomy  1998    radical for prostate cancer  . Cardiac catheterization  2008    minimal dz, Dr Cathie Olden   History reviewed. No pertinent family history. History  Substance Use Topics  . Smoking status: Former Smoker -- 0.50 packs/day    Types: Cigarettes    Quit date: 08/28/1996  . Smokeless tobacco: Not on file  . Alcohol Use: 1.0 oz/week    2 drink(s) per week    Review of Systems  Constitutional: Positive for diaphoresis.  Respiratory: Positive for shortness of breath.   Cardiovascular: Positive for chest pain.  Gastrointestinal: Positive for nausea.  All other systems reviewed and are negative.      Allergies  Sulfonamide derivatives  Home Medications   Current Outpatient Rx  Name  Route  Sig   Dispense  Refill  . amLODipine (NORVASC) 5 MG tablet      TAKE 1 TABLET (5 MG TOTAL) BY MOUTH DAILY.   90 tablet   3   . atorvastatin (LIPITOR) 10 MG tablet      TAKE 1 TABLET (10 MG TOTAL) BY MOUTH DAILY.   90 tablet   3   . baclofen (LIORESAL) 5 mg TABS   Oral   Take 0.5 tablets (5 mg total) by mouth 2 (two) times daily as needed.   30 tablet   0   . clonazePAM (KLONOPIN) 0.5 MG tablet   Oral   Take 1 tablet (0.5 mg total) by mouth as directed. Take 1 tablet by mouth at bedtime and 1/2 tab by mouth once daily as needed   30 tablet   5   . hydrochlorothiazide (MICROZIDE) 12.5 MG capsule      TAKE 1 CAPSULE (12.5 MG TOTAL) BY MOUTH DAILY.   30 capsule   3   . nitroGLYCERIN (NITROLINGUAL) 0.4 MG/SPRAY spray      PLACE 1 SPRAY UNDER THE TONGUE AS NEEDED. FOR EXERTIONAL CHEST PAIN   4.9 g   0   . omeprazole (PRILOSEC) 20 MG capsule      TAKE 1 CAPSULE (20 MG TOTAL) BY MOUTH DAILY.   90 capsule   3   . albuterol (PROVENTIL HFA) 108 (90 BASE) MCG/ACT inhaler  Inhalation   Inhale 2 puffs into the lungs 4 (four) times daily as needed.           Marland Kitchen aspirin (BAYER ASPIRIN) 325 MG tablet   Oral   Take 325 mg by mouth daily.           . cetirizine (ZYRTEC) 10 MG tablet   Oral   Take 10 mg by mouth daily. As needed for allergies          . HYDROcodone-acetaminophen (NORCO/VICODIN) 5-325 MG per tablet   Oral   Take 1 tablet by mouth every 8 (eight) hours as needed.   90 tablet   0   . Polyethylene Glycol 3350 (BASE B POLYETHYLENE GLYCOL) POWD      17 grams of powder mixed with water every other day. Disp qs for 3 months   2500 g   3   . triamcinolone (KENALOG) 0.1 % lotion   Topical   Apply topically.            BP 166/81  Pulse 66  Temp(Src) 97.9 F (36.6 C) (Oral)  Resp 18  Ht 5\' 7"  (1.702 m)  Wt 198 lb (89.812 kg)  BMI 31.00 kg/m2  SpO2 98% Physical Exam  Constitutional: He is oriented to person, place, and time. He appears  well-developed and well-nourished. No distress.  HENT:  Head: Normocephalic and atraumatic.  Mouth/Throat: Oropharynx is clear and moist.  Eyes: Conjunctivae are normal. Pupils are equal, round, and reactive to light.  Neck: Normal range of motion. Neck supple.  Cardiovascular: Normal rate, regular rhythm and intact distal pulses.   Pulmonary/Chest: Effort normal and breath sounds normal. He has no wheezes. He has no rales.  Abdominal: Soft. Bowel sounds are normal. There is no tenderness. There is no rebound and no guarding.  Musculoskeletal: Normal range of motion.  Neurological: He is alert and oriented to person, place, and time.  Skin: Skin is warm and dry.  Psychiatric: He has a normal mood and affect.    ED Course  Procedures (including critical care time) Labs Review Labs Reviewed  CBC WITH DIFFERENTIAL - Abnormal; Notable for the following:    RBC 4.09 (*)    MCH 35.2 (*)    MCHC 36.1 (*)    Platelets 127 (*)    All other components within normal limits  BASIC METABOLIC PANEL  TROPONIN I  PRO B NATRIURETIC PEPTIDE   Imaging Review No results found.   EKG Interpretation   Date/Time:  Friday September 15 2013 03:41:22 EST Ventricular Rate:  58 PR Interval:  146 QRS Duration: 82 QT Interval:  428 QTC Calculation: 420 R Axis:   44 Text Interpretation:  Sinus bradycardia Confirmed by [facility]  MD,  Juno Alers ([id number]) on 09/15/2013 3:56:23 AM      MDM   Final diagnoses:  None    NTG paste placed  Patient will need admission for further testing as has not been cathed or evaluated from a cardiac standpoint since 2008    Shoshanna Mcquitty Alfonso Patten, MD 09/15/13 [phone]

## 2013-09-15 NOTE — H&P (Signed)
[facility] Admission History and Physical Service Pager: [phone]  Patient name: [name] Medical record number: [id number] Date of birth: 04-Dec-1933 Age: 80 y.o. Gender: male  Primary Care Provider: Dorcas Mcmurray, MD Consultants: Cardiology Code Status: full  Chief Complaint: chest pain  Assessment and Plan: [name] is a 80 y.o. male presenting with CP w/ likely MI. PMH is significant for Prostate Cancer, CAD, GERD, Venous insufficiency, hiatal hernia.   Chest pain: concern for ACS given h/o CAD (cath in 2009), HTN, former smoker. May be reflux related as well given h/o GERD and hiatal hernia. Initial troponin neg and EKG w/ sinus brady. Pt trying to "wean off Lipitor" as his daughter told him it causes muscle wasting. Spoke to Cards who agreed to see pt and likely take to cath this morning. We greatly appreciate their help.  Admit to tele (3W) - consult cardiolgy - Continue ASA 325 - continue nitro  - start morphine prn - increase lipitor to 40mg  - start metoprolol - NPO for likely cath - Tele  HTN: Pt w/ baseline HTN and hypertensive to 166systolic. Only taking his losartan and hctz every other day.  - Change home HCTZ and losartan to Qday - start metop  Weakenss: pt has felt increased weakness over the winter months. Likely secondary to more sedentary lifestyle due to not being able to ride bike during the winter months - PT/OT  Venous insufficiency: baseline issue for pt. 1+ on admission.  - compression stockings.   FEN/GI: NPO. - IVF 1/2NS 138ml/hr  Prophylaxis: Hep Bowers TID  Disposition: pending cath and ACS r/o  History of Present Illness: [name] is a 80 y.o. male presenting with CP. Woke up at 2:30 am w/ sharp and heavy CP. Associated w/ SOB and L shoulder pain, sweating, and nausea. Took nitro at home w/ some benefit after 10 min of resting. After pain resolved had general "diaphragm" and diffuse chest ache. Earlier in the  week pt w/ "diaphragm," pain after very citrusy juice. Still w/ pressure feeling. Now w/ general joint pain and aches. Denies orthopnea, LE swelling, HA.   Takes Prilosec from time to time for reflux.   Review Of Systems: Per HPI with the following additions: none Otherwise 12 point review of systems was performed and was unremarkable.  Patient Active Problem List   Diagnosis Date Noted  . Chest pain 09/15/2013  . Unspecified constipation 01/30/2013  . Decreased urine volume 01/19/2013  . Well adult health check 01/02/2013  . Other symptoms involving urinary system(788.99) 12/21/2012  . Benign localized hyperplasia of prostate with urinary obstruction and other lower urinary tract symptoms (LUTS)(600.21) 12/21/2012  . Carcinoma in situ of prostate 12/21/2012  . Left shoulder pain 08/08/2012  . Acute thoracic back pain 07/24/2011  . Obesity 07/24/2011  . Generalized anxiety disorder 07/24/2011  . DIVERTICULOSIS OF COLON 12/25/2009  . DERMATITIS, SEBORRHEIC 12/25/2009  . 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 Memory loss 08/14/2009  . BACK PAIN, LUMBAR 07/19/2009  . PROSTATE CANCER 09/09/2006  . HYPERLIPIDEMIA 09/09/2006  . HYPERTENSION, BENIGN SYSTEMIC 09/09/2006  . CORONARY, ARTERIOSCLEROSIS 09/09/2006  . VENOUS INSUFFICIENCY, CHRONIC 09/09/2006  . GASTROESOPHAGEAL REFLUX, NO ESOPHAGITIS 09/09/2006  . HERNIA, HIATAL, NONCONGENITAL 09/09/2006  . INSOMNIA NOS 09/09/2006   Past Medical History: Past Medical History  Diagnosis Date  . Diverticulitis     recurrent  . Tinnitus   . Hearing aid worn   . Kidney stone     lithotripsy  [id number] w complications, req stents, Dr Rosana Hoes  . Prostate ca    Past Surgical History: Past Surgical History  Procedure Laterality Date  . Carotid endarterectomy  RIGHT    1998  . Prostatectomy  1998    radical for prostate cancer  . Cardiac catheterization  2008    minimal dz, Dr Cathie Olden   Social History: History  Substance Use Topics  .  Smoking status: Former Smoker -- 0.50 packs/day    Types: Cigarettes    Quit date: 08/28/1996  . Smokeless tobacco: Not on file  . Alcohol Use: 1.0 oz/week    2 drink(s) per week   Additional social history: none Please also refer to relevant sections of EMR.  Family History: History reviewed. No pertinent family history. Allergies and Medications: Allergies  Allergen Reactions  . Sulfonamide Derivatives    No current facility-administered medications on file prior to encounter.   Current Outpatient Prescriptions on File Prior to Encounter  Medication Sig Dispense Refill  . amLODipine (NORVASC) 5 MG tablet TAKE 1 TABLET (5 MG TOTAL) BY MOUTH DAILY.  90 tablet  3  . atorvastatin (LIPITOR) 10 MG tablet TAKE 1 TABLET (10 MG TOTAL) BY MOUTH DAILY.  90 tablet  3  . baclofen (LIORESAL) 5 mg TABS Take 0.5 tablets (5 mg total) by mouth 2 (two) times daily as needed.  30 tablet  0  . clonazePAM (KLONOPIN) 0.5 MG tablet Take 1 tablet (0.5 mg total) by mouth as directed. Take 1 tablet by mouth at bedtime and 1/2 tab by mouth once daily as needed  30 tablet  5  . hydrochlorothiazide (MICROZIDE) 12.5 MG capsule TAKE 1 CAPSULE (12.5 MG TOTAL) BY MOUTH DAILY.  30 capsule  3  . nitroGLYCERIN (NITROLINGUAL) 0.4 MG/SPRAY spray PLACE 1 SPRAY UNDER THE TONGUE AS NEEDED. FOR EXERTIONAL CHEST PAIN  4.9 g  0  . omeprazole (PRILOSEC) 20 MG capsule TAKE 1 CAPSULE (20 MG TOTAL) BY MOUTH DAILY.  90 capsule  3  . albuterol (PROVENTIL HFA) 108 (90 BASE) MCG/ACT inhaler Inhale 2 puffs into the lungs 4 (four) times daily as needed.        Marland Kitchen aspirin (BAYER ASPIRIN) 325 MG tablet Take 325 mg by mouth daily.        . cetirizine (ZYRTEC) 10 MG tablet Take 10 mg by mouth daily. As needed for allergies       . HYDROcodone-acetaminophen (NORCO/VICODIN) 5-325 MG per tablet Take 1 tablet by mouth every 8 (eight) hours as needed.  90 tablet  0  . Polyethylene Glycol 3350 (BASE B POLYETHYLENE GLYCOL) POWD 17 grams of powder  mixed with water every other day. Disp qs for 3 months  2500 g  3  . triamcinolone (KENALOG) 0.1 % lotion Apply topically.          Objective: BP 149/67  Pulse 60  Temp(Src) 97.7 F (36.5 C) (Oral)  Resp 16  Ht 5\' 7"  (1.702 m)  Wt 199 lb (90.266 kg)  BMI 31.16 kg/m2  SpO2 99% Exam: General: Mild distress, obese HEENT: mmm EOMI Cardiovascular: RRR, faint heart sounds Respiratory: CTAB, nml effort Abdomen: NABS, non-ttp Extremities: 1+ LE edema Skin: intact, w/o rash Neuro: CN2-12 grossly intact  Labs and Imaging: Results for orders placed during the hospital encounter of 09/15/13 (from the past 24 hour(s))  CBC WITH DIFFERENTIAL     Status: Abnormal   Collection Time    09/15/13  3:45 AM      Result Value Ref  Range   WBC 5.9  4.0 - 10.5 K/uL   RBC 4.09 (*) 4.22 - 5.81 MIL/uL   Hemoglobin 14.4  13.0 - 17.0 g/dL   HCT 39.9  39.0 - 52.0 %   MCV 97.6  78.0 - 100.0 fL   MCH 35.2 (*) 26.0 - 34.0 pg   MCHC 36.1 (*) 30.0 - 36.0 g/dL   RDW 13.0  11.5 - 15.5 %   Platelets 127 (*) 150 - 400 K/uL   Neutrophils Relative % 43  43 - 77 %   Lymphocytes Relative 33  12 - 46 %   Monocytes Relative 13 (*) 3 - 12 %   Eosinophils Relative 9 (*) 0 - 5 %   Basophils Relative 2 (*) 0 - 1 %   Neutro Abs 2.6  1.7 - 7.7 K/uL   Lymphs Abs 1.9  0.7 - 4.0 K/uL   Monocytes Absolute 0.8  0.1 - 1.0 K/uL   Eosinophils Absolute 0.5  0.0 - 0.7 K/uL   Basophils Absolute 0.1  0.0 - 0.1 K/uL   WBC Morphology WHITE COUNT CONFIRMED ON SMEAR     Smear Review LARGE PLATELETS PRESENT    BASIC METABOLIC PANEL     Status: Abnormal   Collection Time    09/15/13  3:45 AM      Result Value Ref Range   Sodium 143  137 - 147 mEq/L   Potassium 3.6 (*) 3.7 - 5.3 mEq/L   Chloride 101  96 - 112 mEq/L   CO2 29  19 - 32 mEq/L   Glucose, Bld 112 (*) 70 - 99 mg/dL   BUN 18  6 - 23 mg/dL   Creatinine, Ser 1.20  0.50 - 1.35 mg/dL   Calcium 9.5  8.4 - 10.5 mg/dL   GFR calc non Af Amer 56 (*) >90 mL/min   GFR calc  Af Amer 65 (*) >90 mL/min  TROPONIN I     Status: None   Collection Time    09/15/13  3:45 AM      Result Value Ref Range   Troponin I <0.30  <0.30 ng/mL  PRO B NATRIURETIC PEPTIDE     Status: None   Collection Time    09/15/13  3:45 AM      Result Value Ref Range   Pro B Natriuretic peptide (BNP) 188.4  0 - 450 pg/mL   Dg Chest Portable 1 View  09/15/2013   CLINICAL DATA:  Followup quit chest pain. Shortness breath. History of prostate cancer.  EXAM: PORTABLE CHEST - 1 VIEW  COMPARISON:  08/15/2013 and 12/18/2009.  FINDINGS: Central pulmonary vascular prominence stable without evidence of pulmonary edema, segmental infiltrate or pneumothorax.  Mildly tortuous aorta.  Heart size top-normal.  No osseous lesion noted.  IMPRESSION: Central pulmonary vascular prominence stable without evidence of pulmonary edema, segmental infiltrate or pneumothorax.   Electronically Signed   By: Chauncey Cruel M.D.   On: 09/15/2013 04:24 \   Waldemar Dickens, MD 09/15/2013, 7:02 AM PGY-3, [facility] pager: [phone], text pages welcome

## 2013-09-15 NOTE — H&P (Signed)
[facility] ATTENDING ADMISSION NOTE Kehinde Eniola,MD I  have seen and examined this patient, reviewed their chart. I have discussed this patient with the resident. I agree with the resident's findings, assessment and care plan.  80 Y/O M with PMX of CAD,HTN,HLD, Prostate CA who presented to the hospital with few hrs hx of central chest pain radiating to his left shoulder, pain was dull in nature which later turned into burning sensation with heaviness, he took Nitroglycerine at home with some improvement. There was associated weakness and diaphoresis at the time he started having chest pain,very mild SOB which he attributed to him being anxious. He denies any other concern, his chest pain is better now. Patient mentioned he had cardiac cath in the past and endarterectomy, he has not seen a cardiologist in many years since he has been doing well on his home meds.  Filed Vitals:   09/15/13 0342 09/15/13 0456 09/15/13 0634  BP: 166/81  149/67  Pulse: 66 60 60  Temp: 97.9 F (36.6 C)  97.7 F (36.5 C)  TempSrc: Oral  Oral  Resp: 18 16 16   Height: 5\' 7"  (1.702 m)  5\' 7"  (1.702 m)  Weight: 198 lb (89.812 kg)  199 lb (90.266 kg)  SpO2: 98% 98% 99%   EXAM: Gen/Neuro: Awake and alert, not in distress, Oriented X 3, CN grossly intact. HEENT: EOMI, PERRLA. Resp: Air entry equal B/L and clear. CV: S1 S2 normal, no murmurs. Abd: NT/ND, BS+ Ext: ++ Edema of both feet more on the left.  A/P: 80 Y/O male with  1. Chest pain R/O ACS;     EKG reviewed with no ST or T wave changes suggestive of ischemia.    Troponin negative so far.    ASA and Nitroglycerine patch.    Pain control as needed    Obtain risk stratification labs.    On Betablocker and Statin.    Cardiology consult for possible stress test.Continue tele monitoring.  2. HTN: Restart home regimen.

## 2013-09-15 NOTE — H&P (View-Only) (Signed)
Primary Cardiologist: [facility] Reason for Consultation: CP/[place]   HPI:  80 y/o with HTN, obesity, PAD s/p CEA, GERD, gallstones s/p cholecystectomy presents with CP.   Says he used to see Dr. Acie Fredrickson regularly has had several caths in past last 2008 which showed mild CAD. Treated medically. Has not followed up in at least 4 years.  Says he has frequent CP take NTG spray almost daily. Over last 6 months has gotten worse. Now CP every time he walks up hill. Has gained 35 pounds over the winter.  Woke up at 230a with severe CP, sweaty and sob. Came to ER. CP resolved with NTG. ECG and troponin normal. Now CP free except for soreness across diaphragm.    Review of Systems:     Cardiac Review of Systems: {Y] = yes [ ]  = no  Chest Pain [  y  ]  Resting SOB [   ] Exertional SOB  [  y]  Orthopnea [  ]   Pedal Edema [   ]    Palpitations [  ] Syncope  [  ]   Presyncope [   ]  General Review of Systems: [Y] = yes [  ]=no Constitional: recent weight change [  ]; anorexia [  ]; fatigue [  ]; nausea [  ]; night sweats [  ]; fever [  ]; or chills [  ];                                                                      Eyes : blurred vision [  ]; diplopia [   ]; vision changes [  ];  Amaurosis fugax[  ]; Resp: cough [  ];  wheezing[  ];  hemoptysis[  ];  PND [  ];  GI:  gallstones[  y], vomiting[  ];  dysphagia[  ]; melena[  ];  hematochezia [  ]; heartburn[  ];   GU: kidney stones [  ]; hematuria[  ];   dysuria [  ];  nocturia[  ]; incontinence [  ];             Skin: rash, swelling[  ];, hair loss[  ];  peripheral edema[  ];  or itching[  ]; Musculosketetal: myalgias[  ];  joint swelling[  ];  joint erythema[  ];  joint pain[ y ];  back pain[ y ];  Heme/Lymph: bruising[  ];  bleeding[  ];  anemia[  ];  Neuro: TIA[  ];  headaches[  ];  stroke[  ];  vertigo[  ];  seizures[  ];   paresthesias[  ];  difficulty walking[  ];  Psych:depression[  ]; anxiety[  ];  Endocrine: diabetes[  ];  thyroid  dysfunction[  ];  Other:  Past Medical History  Diagnosis Date  . Diverticulitis     recurrent  . Tinnitus   . Hearing aid worn   . Kidney stone     lithotripsy [id number] w complications, req stents, Dr Rosana Hoes  . Prostate ca     Medications Prior to Admission  Medication Sig Dispense Refill  . amLODipine (NORVASC) 5 MG tablet TAKE 1 TABLET (5 MG TOTAL) BY MOUTH DAILY.  90 tablet  3  . atorvastatin (  LIPITOR) 10 MG tablet TAKE 1 TABLET (10 MG TOTAL) BY MOUTH DAILY.  90 tablet  3  . baclofen (LIORESAL) 5 mg TABS Take 0.5 tablets (5 mg total) by mouth 2 (two) times daily as needed.  30 tablet  0  . clonazePAM (KLONOPIN) 0.5 MG tablet Take 1 tablet (0.5 mg total) by mouth as directed. Take 1 tablet by mouth at bedtime and 1/2 tab by mouth once daily as needed  30 tablet  5  . hydrochlorothiazide (MICROZIDE) 12.5 MG capsule TAKE 1 CAPSULE (12.5 MG TOTAL) BY MOUTH DAILY.  30 capsule  3  . nitroGLYCERIN (NITROLINGUAL) 0.4 MG/SPRAY spray PLACE 1 SPRAY UNDER THE TONGUE AS NEEDED. FOR EXERTIONAL CHEST PAIN  4.9 g  0  . omeprazole (PRILOSEC) 20 MG capsule TAKE 1 CAPSULE (20 MG TOTAL) BY MOUTH DAILY.  90 capsule  3  . albuterol (PROVENTIL HFA) 108 (90 BASE) MCG/ACT inhaler Inhale 2 puffs into the lungs 4 (four) times daily as needed.        Marland Kitchen aspirin (BAYER ASPIRIN) 325 MG tablet Take 325 mg by mouth daily.        . cetirizine (ZYRTEC) 10 MG tablet Take 10 mg by mouth daily. As needed for allergies       . HYDROcodone-acetaminophen (NORCO/VICODIN) 5-325 MG per tablet Take 1 tablet by mouth every 8 (eight) hours as needed.  90 tablet  0  . Polyethylene Glycol 3350 (BASE B POLYETHYLENE GLYCOL) POWD 17 grams of powder mixed with water every other day. Disp qs for 3 months  2500 g  3  . triamcinolone (KENALOG) 0.1 % lotion Apply topically.           Marland Kitchen amLODipine  5 mg Oral Daily  . aspirin  325 mg Oral Daily  . atorvastatin  40 mg Oral Daily  . clonazePAM  0.5 mg Oral QHS  . heparin  5,000 Units  Subcutaneous 3 times per day  . hydrochlorothiazide  12.5 mg Oral Daily  . metoprolol tartrate  12.5 mg Oral BID  . nitroGLYCERIN  1 inch Topical 4 times per day    Infusions: . sodium chloride      Allergies  Allergen Reactions  . Sulfonamide Derivatives     History   Social History  . Marital Status: Married    Spouse Name: N/A    Number of Children: N/A  . Years of Education: N/A   Occupational History  . Not on file.   Social History Main Topics  . Smoking status: Former Smoker -- 0.50 packs/day    Types: Cigarettes    Quit date: 08/28/1996  . Smokeless tobacco: Not on file  . Alcohol Use: 1.0 oz/week    2 drink(s) per week  . Drug Use: No  . Sexual Activity: Yes    Birth Control/ Protection: None   Other Topics Concern  . Not on file   Social History Narrative   Lives with wife--recently diagnosed with breast ca; No smoking; Occassional wine; Retired; 2 daughters live in [place]--5 grandchildren(boys). On weight watchers. Lives in [place] with wife. Retired Solicitor. Tobacco history 2 ppd x 32 years. No smoking x 25 years. No drugs.    History reviewed. No pertinent family history.  PHYSICAL EXAM: Filed Vitals:   09/15/13 0634  BP: 149/67  Pulse: 60  Temp: 97.7 F (36.5 C)  Resp: 16    No intake or output data in the 24 hours ending 09/15/13 0814  General:  Well  appearing. No respiratory difficulty HEENT: normal Neck: supple. no JVD. Carotids 2+ bilat; no bruits. No lymphadenopathy or thryomegaly appreciated. L CEA scar Cor: PMI nondisplaced. Regular rate & rhythm. No rubs, gallops or murmurs. Lungs: clear Abdomen: obese  soft, nontender, nondistended. No hepatosplenomegaly. No bruits or masses. Good bowel sounds. Extremities: no cyanosis, clubbing, rash, Tr-1+ edema L>R Neuro: alert & oriented x 3, cranial nerves grossly intact. moves all 4 extremities w/o difficulty. Affect pleasant.  ECG: SB 58 No ST-T wave abnormalities.     Results for orders placed during the hospital encounter of 09/15/13 (from the past 24 hour(s))  CBC WITH DIFFERENTIAL     Status: Abnormal   Collection Time    09/15/13  3:45 AM      Result Value Ref Range   WBC 5.9  4.0 - 10.5 K/uL   RBC 4.09 (*) 4.22 - 5.81 MIL/uL   Hemoglobin 14.4  13.0 - 17.0 g/dL   HCT 39.9  39.0 - 52.0 %   MCV 97.6  78.0 - 100.0 fL   MCH 35.2 (*) 26.0 - 34.0 pg   MCHC 36.1 (*) 30.0 - 36.0 g/dL   RDW 13.0  11.5 - 15.5 %   Platelets 127 (*) 150 - 400 K/uL   Neutrophils Relative % 43  43 - 77 %   Lymphocytes Relative 33  12 - 46 %   Monocytes Relative 13 (*) 3 - 12 %   Eosinophils Relative 9 (*) 0 - 5 %   Basophils Relative 2 (*) 0 - 1 %   Neutro Abs 2.6  1.7 - 7.7 K/uL   Lymphs Abs 1.9  0.7 - 4.0 K/uL   Monocytes Absolute 0.8  0.1 - 1.0 K/uL   Eosinophils Absolute 0.5  0.0 - 0.7 K/uL   Basophils Absolute 0.1  0.0 - 0.1 K/uL   WBC Morphology WHITE COUNT CONFIRMED ON SMEAR     Smear Review LARGE PLATELETS PRESENT    BASIC METABOLIC PANEL     Status: Abnormal   Collection Time    09/15/13  3:45 AM      Result Value Ref Range   Sodium 143  137 - 147 mEq/L   Potassium 3.6 (*) 3.7 - 5.3 mEq/L   Chloride 101  96 - 112 mEq/L   CO2 29  19 - 32 mEq/L   Glucose, Bld 112 (*) 70 - 99 mg/dL   BUN 18  6 - 23 mg/dL   Creatinine, Ser 1.20  0.50 - 1.35 mg/dL   Calcium 9.5  8.4 - 10.5 mg/dL   GFR calc non Af Amer 56 (*) >90 mL/min   GFR calc Af Amer 65 (*) >90 mL/min  TROPONIN I     Status: None   Collection Time    09/15/13  3:45 AM      Result Value Ref Range   Troponin I <0.30  <0.30 ng/mL  PRO B NATRIURETIC PEPTIDE     Status: None   Collection Time    09/15/13  3:45 AM      Result Value Ref Range   Pro B Natriuretic peptide (BNP) 188.4  0 - 450 pg/mL   Dg Chest Portable 1 View  09/15/2013   CLINICAL DATA:  Followup quit chest pain. Shortness breath. History of prostate cancer.  EXAM: PORTABLE CHEST - 1 VIEW  COMPARISON:  08/15/2013 and 12/18/2009.   FINDINGS: Central pulmonary vascular prominence stable without evidence of pulmonary edema, segmental infiltrate or pneumothorax.  Mildly tortuous  aorta.  Heart size top-normal.  No osseous lesion noted.  IMPRESSION: Central pulmonary vascular prominence stable without evidence of pulmonary edema, segmental infiltrate or pneumothorax.   Electronically Signed   By: Chauncey Cruel M.D.   On: 09/15/2013 04:24     ASSESSMENT: 1. CP/USA 2. Mild CAD on cath 2008 3. Carotid stenosis s/p L CEA   PLAN/DISCUSSION:  Symptoms concerning for [place]. Will proceed with cath later today. Keep NPO. Treat with ASA, b-blocker, statin.   Izaya Netherton,MD 8:21 AM

## 2013-09-16 DIAGNOSIS — R079 Chest pain, unspecified: Secondary | ICD-10-CM | POA: Diagnosis not present

## 2013-09-16 DIAGNOSIS — I209 Angina pectoris, unspecified: Secondary | ICD-10-CM | POA: Diagnosis not present

## 2013-09-16 MED ORDER — CARVEDILOL 3.125 MG PO TABS
3.1250 mg | ORAL_TABLET | Freq: Two times a day (BID) | ORAL | Status: DC
Start: 2013-09-16 — End: 2013-09-16
  Filled 2013-09-16 (×2): qty 1

## 2013-09-16 MED ORDER — OMEPRAZOLE 20 MG PO CPDR: 40.0000 mg | DELAYED_RELEASE_CAPSULE | Freq: Every day | ORAL | Status: DC

## 2013-09-16 MED ORDER — CARVEDILOL 3.125 MG PO TABS: 3.1250 mg | ORAL_TABLET | Freq: Two times a day (BID) | ORAL | Status: DC

## 2013-09-16 MED ORDER — ATORVASTATIN CALCIUM 40 MG PO TABS: 40.0000 mg | ORAL_TABLET | Freq: Every day | ORAL | Status: DC

## 2013-09-16 MED ORDER — PANTOPRAZOLE SODIUM 40 MG PO TBEC
40.0000 mg | DELAYED_RELEASE_TABLET | Freq: Every day | ORAL | Status: DC
  Administered 2013-09-16: 40 mg via ORAL
  Filled 2013-09-16: qty 1

## 2013-09-16 MED ORDER — CARVEDILOL 3.125 MG PO TABS
3.1250 mg | ORAL_TABLET | Freq: Two times a day (BID) | ORAL | Status: DC
  Administered 2013-09-16: 3.125 mg via ORAL
  Filled 2013-09-16 (×3): qty 1

## 2013-09-16 NOTE — Progress Notes (Signed)
Patient discharged this afternoon accompanied by his wife, d/c instructions given and all questions answered. Educated on the precautions on the Right arm until seeing the MD. All assessments remain unchanged prior to discharged.

## 2013-09-16 NOTE — Discharge Instructions (Signed)
You were admitted with chest pain and a significant cardiovascular history. You had a cardiac catheterization that did not require stenting. The hospital cardiologist thinks you have intermediate chest pain syndrome. This could also be related to your acid reflux and chocolate+fizzy drink just before bed. It is still very important to follow up regularly with a cardiologist and your primary doctor to keep up with controlling risk factors for worsened heart disease. As much as possible, avoid caffeine, chocolate, and carbonation, especially before bed.  Follow-up: - With Dr Nori Riis (or any available member of Family Practice team) and Dr [place] ([facility]) in 1-2 weeks. - As needed immediately if you develop worsened chest pain, shortness of breath, dizziness or fainting, or other concerns.  New medications / med changes: - Start CARVEDILOL (COREG) 3.125 mg twice daily, which may have decreased risk of low heart rate compared to metoprolol. - Increase atorvastatin (Lipitor) to 40mg  daily - Take omeprazole 40mg  daily for 1-2 weeks, then back down to 20mg  daily. - Discuss stopping Norvasc with your primary doctor, as you have leg swelling and we have added carvedilol for BP control.  I am including healthy diet information below (to help control blood pressure and weight) and we are checking labs for diabetes and cholesterol that should have results by the time you follow-up.  Be well.  Hilton Sinclair, MD   DASH Diet The DASH diet stands for "Dietary Approaches to Stop Hypertension." It is a healthy eating plan that has been shown to reduce high blood pressure (hypertension) in as little as 14 days, while also possibly providing other significant health benefits. These other health benefits include reducing the risk of breast cancer after menopause and reducing the risk of type 2 diabetes, heart disease, colon cancer, and stroke. Health benefits also include weight loss  and slowing kidney failure in patients with chronic kidney disease.  DIET GUIDELINES  Limit salt (sodium). Your diet should contain less than 1500 mg of sodium daily.  Limit refined or processed carbohydrates. Your diet should include mostly whole grains. Desserts and added sugars should be used sparingly.  Include small amounts of heart-healthy fats. These types of fats include nuts, oils, and tub margarine. Limit saturated and trans fats. These fats have been shown to be harmful in the body. CHOOSING FOODS  The following food groups are based on a 2000 calorie diet. See your Registered Dietitian for individual calorie needs. Grains and Grain Products (6 to 8 servings daily)  Eat More Often: Whole-wheat bread, brown rice, whole-grain or wheat pasta, quinoa, popcorn without added fat or salt (air popped).  Eat Less Often: White bread, white pasta, white rice, cornbread. Vegetables (4 to 5 servings daily)  Eat More Often: Fresh, frozen, and canned vegetables. Vegetables may be raw, steamed, roasted, or grilled with a minimal amount of fat.  Eat Less Often/Avoid: Creamed or fried vegetables. Vegetables in a cheese sauce. Fruit (4 to 5 servings daily)  Eat More Often: All fresh, canned (in natural juice), or frozen fruits. Dried fruits without added sugar. One hundred percent fruit juice ( cup [237 mL] daily).  Eat Less Often: Dried fruits with added sugar. Canned fruit in light or heavy syrup. [employer], Fish, and Poultry (2 servings or less daily. One serving is 3 to 4 oz [85-114 g]).  Eat More Often: Ninety percent or leaner ground beef, tenderloin, sirloin. Round cuts of beef, chicken breast, [place] breast. All fish. Grill, bake, or broil your meat.  Nothing should be fried.  Eat Less Often/Avoid: Fatty cuts of meat, [place], or chicken leg, thigh, or wing. Fried cuts of meat or fish. Dairy (2 to 3 servings)  Eat More Often: Low-fat or fat-free milk, low-fat plain or light yogurt,  reduced-fat or part-skim cheese.  Eat Less Often/Avoid: Milk (whole, 2%).Whole milk yogurt. Full-fat cheeses. Nuts, Seeds, and Legumes (4 to 5 servings per week)  Eat More Often: All without added salt.  Eat Less Often/Avoid: Salted nuts and seeds, canned beans with added salt. Fats and Sweets (limited)  Eat More Often: Vegetable oils, tub margarines without trans fats, sugar-free gelatin. Mayonnaise and salad dressings.  Eat Less Often/Avoid: Coconut oils, palm oils, butter, stick margarine, cream, half and half, cookies, candy, pie. FOR MORE INFORMATION The Dash Diet Eating Plan: www.dashdiet.org Document Released: 06/18/2011 Document Revised: 09/21/2011 Document Reviewed: 06/18/2011 [facility] Patient Information 2014 [place], [place].

## 2013-09-16 NOTE — Progress Notes (Signed)
Patient ID: DASHAUN DASKAL, male   DOB: 1934-06-20, 80 y.o.   MRN: [id number]   Patient Name: [name] Date of Encounter: 09/16/2013     Active Problems:   Chest pain   Intermediate coronary syndrome    SUBJECTIVE No chest pain.   CURRENT MEDS . amLODipine  5 mg Oral Daily  . aspirin  325 mg Oral Daily  . atorvastatin  40 mg Oral Daily  . clonazePAM  0.5 mg Oral QHS  . hydrochlorothiazide  12.5 mg Oral Daily  . loratadine  10 mg Oral Daily  . metoprolol tartrate  12.5 mg Oral BID    OBJECTIVE  Filed Vitals:   09/15/13 2000 09/15/13 2030 09/15/13 2100 09/16/13 0554  BP: 130/65 144/54 135/58 112/49  Pulse: 82 77 78 59  Temp:   97.6 F (36.4 C) 97.5 F (36.4 C)  TempSrc:   Oral Oral  Resp: 18 18 18 18   Height:      Weight:    199 lb 4.8 oz (90.402 kg)  SpO2: 97% 97% 97% 98%    Intake/Output Summary (Last 24 hours) at 09/16/13 0954 Last data filed at 09/16/13 0841  Gross per 24 hour  Intake    240 ml  Output    650 ml  Net   -410 ml   Filed Weights   09/15/13 0342 09/15/13 0634 09/16/13 0554  Weight: 198 lb (89.812 kg) 199 lb (90.266 kg) 199 lb 4.8 oz (90.402 kg)    PHYSICAL EXAM  General: Pleasant, NAD. Neuro: Alert and oriented X 3. Moves all extremities spontaneously. Psych: Normal affect. HEENT:  Normal  Neck: Supple without bruits or JVD. Lungs:  Resp regular and unlabored, CTA. Heart: RRR no s3, s4, or murmurs. Abdomen: Soft, non-tender, non-distended, BS + x 4.  Extremities: No clubbing, cyanosis or edema. DP/PT/Radials 2+ and equal bilaterally.  Accessory Clinical Findings  CBC  Recent Labs  09/15/13 0345  WBC 5.9  NEUTROABS 2.6  HGB 14.4  HCT 39.9  MCV 97.6  PLT [id number]*   Basic Metabolic Panel  Recent Labs  09/15/13 0345  NA 143  K 3.6*  CL 101  CO2 29  GLUCOSE 112*  BUN 18  CREATININE 1.20  CALCIUM 9.5   Liver Function Tests No results found for this basename: AST, ALT, ALKPHOS, BILITOT, PROT, ALBUMIN,  in the last 72  hours No results found for this basename: LIPASE, AMYLASE,  in the last 72 hours Cardiac Enzymes  Recent Labs  09/15/13 0345  TROPONINI <0.30   BNP No components found with this basename: POCBNP,  D-Dimer No results found for this basename: DDIMER,  in the last 72 hours Hemoglobin A1C No results found for this basename: HGBA1C,  in the last 72 hours Fasting Lipid Panel No results found for this basename: CHOL, HDL, LDLCALC, TRIG, CHOLHDL, LDLDIRECT,  in the last 72 hours Thyroid Function Tests No results found for this basename: TSH, T4TOTAL, FREET3, T3FREE, THYROIDAB,  in the last 72 hours  TELE  nsr  Radiology/Studies  Dg Chest Portable 1 View  09/15/2013   CLINICAL DATA:  Followup quit chest pain. Shortness breath. History of prostate cancer.  EXAM: PORTABLE CHEST - 1 VIEW  COMPARISON:  08/15/2013 and 12/18/2009.  FINDINGS: Central pulmonary vascular prominence stable without evidence of pulmonary edema, segmental infiltrate or pneumothorax.  Mildly tortuous aorta.  Heart size top-normal.  No osseous lesion noted.  IMPRESSION: Central pulmonary vascular prominence stable without evidence of pulmonary edema, segmental  infiltrate or pneumothorax.   Electronically Signed   By: Chauncey Cruel M.D.   On: 09/15/2013 04:24    ASSESSMENT AND PLAN 1. Intermediate chest pain syndrome 2. Known CAD 3. HTN Rec: will switch from metoprolol to coreg as he has had a h/o bradycardia on metoprolol in the past and coreg may not affect his heart rate as much, particularly at low dose. Harper for discharge. He will follow up with Dr. Martinique in our office.  Gregg Taylor,M.D.  Gregg Taylor,M.D.  09/16/2013 9:54 AM

## 2013-09-16 NOTE — Progress Notes (Signed)
Utilization Review completed.  

## 2013-09-16 NOTE — Discharge Summary (Signed)
Patient seen and examined by me, discussed with resident team and I agree with Dr Landry Corporal note for today and plans for discharge.  Dalbert Mayotte, MD

## 2013-09-16 NOTE — Discharge Summary (Signed)
[facility] Discharge Summary  Patient name: [name] Medical record number: [id number] Date of birth: January 13, 1934 Age: 80 y.o. Gender: male Date of Admission: 09/15/2013  Date of Discharge: 09/16/2013  Admitting Physician: Willeen Niece, MD  Primary Care Provider: Dorcas Mcmurray, MD Consultants: Cardiology  Indication for Hospitalization: Chest pain with h/o CAD  Discharge Diagnoses/Problem List:  Intermediate chest pain syndrome GERD Known CAD Hypertension  Disposition: Home  Discharge Condition: Stable  Discharge Exam: BP 138/63  Pulse 59  Temp(Src) 97.5 F (36.4 C) (Oral)  Resp 18  Ht 5\' 7"  (1.702 m)  Wt 199 lb 4.8 oz (90.402 kg)  BMI 31.21 kg/m2  SpO2 98%  GEN: NAD, pleasant, seated on side of bed, stands to shake my hand HEENT: Atraumatic, normocephalic, neck supple, EOMI, sclera clear  CV: RRR, no murmurs, rubs, or gallops, distant, 1+ bilateral DP pulses through LE edema PULM: CTAB, normal effort ABD: Soft, mildly tender at epigastrum, nondistended though obese, no organomegaly SKIN: No rash or cyanosis; warm and well-perfused EXTR: 2+ pitting LE edema to mid-shin, no calf tenderness or erythema PSYCH: Mood and affect euthymic, normal rate and volume of speech NEURO: Awake, alert, no focal deficits grossly, normal speech  Brief Hospital Course: 80 y.o. male with h/o CAD and cath 2009, HTN, and GERD who presented with chest pain yesterday morning that was central, radiated to left shoulder, and felt like "burning" after eating a "chocolate orange" and fizzy drink the night before. Workup included trop neg x 1 and unimpressive BNP and CXR (central pulm vascular prominence stable). Cardiac catheterization yesterday showed moderate proximal RCA stenosis that was read as not flow-limiting. Cardiology consult diagnosing likely intermediate chest pain syndrome and recommended medical management by optimizing BP, cholesterol, and antiplatelet.  Continued aspirin 325mg , increased to lipitor 40mg , and added metoprolol, which was later changed to carvedilol 3.125mg  BID due to reported h/o bradycardia with beta blocker. Also considered worsened GERD, and increased omeprazole for 2 weeks with recommendation to minimize chocolate, caffeine, and carbonated beverages especially before bed. Recommended f/u with PCP and cardiology (Dr Martinique at [facility]) in 1-2 weeks.  Issues for Follow Up:  - F/u A1c and lipid panel (collected just prior to d/c, likely after breakfast). - F/u with cards. - F/u for bradycardia after starting beta blocker. - Consider d/c norvasc due to LE edema.  Significant Procedures: Cardiac catheterization.  Significant Labs and Imaging:   Recent Labs Lab 09/15/13 0345  WBC 5.9  HGB 14.4  HCT 39.9  PLT 127*    Recent Labs Lab 09/15/13 0345  NA 143  K 3.6*  CL 101  CO2 29  GLUCOSE 112*  BUN 18  CREATININE 1.20  CALCIUM 9.5   1 view CXR: IMPRESSION:  Central pulmonary vascular prominence stable without evidence of  pulmonary edema, segmental infiltrate or pneumothorax.   Results/Tests Pending at Time of Discharge: A1c and lipid panel (collected after breakfast in attempt to get prior to d/c)  Discharge Medications:    Medication List         amLODipine 5 MG tablet  Commonly known as:  NORVASC  Take 5 mg by mouth daily.     atorvastatin 40 MG tablet  Commonly known as:  LIPITOR  Take 1 tablet (40 mg total) by mouth daily.     BAYER ASPIRIN 325 MG tablet  Generic drug:  aspirin  Take 325 mg by mouth daily.     BION TEARS OP  Place 1  drop into both eyes daily as needed (dry eyes).     carvedilol 3.125 MG tablet  Commonly known as:  COREG  Take 1 tablet (3.125 mg total) by mouth 2 (two) times daily with a meal.     cetirizine 10 MG tablet  Commonly known as:  ZYRTEC  Take 10 mg by mouth daily. As needed for allergies     cholecalciferol 1000 UNITS tablet  Commonly known as:  VITAMIN D   Take 1,000 Units by mouth daily.     clonazePAM 0.5 MG tablet  Commonly known as:  KLONOPIN  Take 0.25-0.5 mg by mouth 2 (two) times daily as needed for anxiety. Pt takes .25 mg during the day and 0.5 mg at bedtime     EQ IBUPROFEN PO  Take 1-2 tablets by mouth every 6 (six) hours as needed (pain).     hydrochlorothiazide 12.5 MG capsule  Commonly known as:  MICROZIDE  Take 12.5 mg by mouth daily.     HYDROcodone-acetaminophen 5-325 MG per tablet  Commonly known as:  NORCO/VICODIN  Take 1 tablet by mouth every 8 (eight) hours as needed.     nitroGLYCERIN 0.4 MG/SPRAY spray  Commonly known as:  NITROLINGUAL  Place 1 spray under the tongue every 5 (five) minutes x 3 doses as needed for chest pain.     omeprazole 20 MG capsule  Commonly known as:  PRILOSEC  Take 2 capsules (40 mg total) by mouth daily. For 1-2 weeks, then resume previous dosing.     POLYETHYLENE GLYCOL 3350 PO  Take 17 g by mouth daily as needed (mild constipation).     PROVENTIL HFA 108 (90 BASE) MCG/ACT inhaler  Generic drug:  albuterol  Inhale 2 puffs into the lungs 4 (four) times daily as needed for wheezing.     triamcinolone lotion 0.1 %  Commonly known as:  KENALOG  Apply 1 application topically daily. After shower     VITAMIN B-12 PO  Take 1 tablet by mouth daily.        Discharge Instructions: Please refer to Patient Instructions section of EMR for full details.  Patient was counseled important signs and symptoms that should prompt return to medical care, changes in medications, dietary instructions, activity restrictions, and follow up appointments.   Follow-Up Appointments: Follow-up Information   Follow up with Peter Martinique, MD. Schedule an appointment as soon as possible for a visit in 1 week. (for hospital follow-up. You can ask to be seen by Dr Martinique.)    Specialty:  Cardiology   Contact information:   [address] [place]  [address] [phone]       Follow up  with Dorcas Mcmurray, MD. Schedule an appointment as soon as possible for a visit in 1 week. (for hospital follow-up. Ask for Dr Nori Riis or any of the inpatient team (Dr Burns [place], [place], or [place]).)    Specialties:  Family Medicine, Sports Medicine   Contact information:   [address] [place] [address] [phone]       Hilton Sinclair, MD 09/16/2013, 11:34 AM PGY-2, [place]

## 2013-09-16 NOTE — Progress Notes (Signed)
FMTS Attending Note Patient seen and examined by me this morning, discussed with resident team.  Patient reports complete resolution of his chest pain, feels well this morning and is eager to return home.  Results of cardiac cath noted, no findings that appear contributory to his presenting chest pain. [name] recalls having some dark chocolate the night before his symptom onset; also prior diagnosis of hiatal hernia.  Wonders if these might have contributed to his presenting chest pain.  Plan for discharge today on carvedilol as his new beta blocker, given prior bradycardia on metoprolol.  Zachary Mayotte, MD

## 2013-09-18 NOTE — Progress Notes (Signed)
October 01, 2013 0942  PT G-Codes **NOT FOR INPATIENT CLASS**  Functional Assessment Tool Used assist level  Functional Limitation Mobility: Walking and moving around  Mobility: Walking and Moving Around Current Status [id number]) CH  Mobility: Walking and Moving Around Goal Status [id number]) CH  Mobility: Walking and Moving Around Discharge Status [phone]) CH  late entry g-codes for eval dated Oct 01, 2013. Barbarann Ehlers Day, [place], DPT [phone]

## 2013-09-27 ENCOUNTER — Telehealth: Payer: Self-pay | Admitting: Family Medicine

## 2013-09-27 ENCOUNTER — Telehealth: Payer: Self-pay | Admitting: *Deleted

## 2013-09-27 NOTE — Telephone Encounter (Signed)
Dr Nori Riis had prescribed losartan. It doesn't show up on [facility] record. Cardologist gave him beta blocker cardevol. He needs advice about which one to take Please advise

## 2013-09-27 NOTE — Telephone Encounter (Signed)
Patient called to be sure that it is ok for him to take the carvedilol, that was prescribed post-cath, along with the losartan-hctz that was prescribed by his pcp. He was wondering since he was not sure if we had record of the hyzaar as it was not on his hospital d/c summary. Please advise. Thanks, MI

## 2013-09-27 NOTE — Telephone Encounter (Signed)
Okay to take carvedilol

## 2013-09-28 NOTE — Telephone Encounter (Signed)
Patient aware.

## 2013-09-29 NOTE — Telephone Encounter (Signed)
Relayed message,patient voiced understanding. [name] S  

## 2013-09-29 NOTE — Telephone Encounter (Signed)
According to cards notes he should be on both THANKS! Dorcas Mcmurray

## 2013-10-04 ENCOUNTER — Encounter: Payer: Medicare Other | Admitting: Cardiology

## 2013-10-09 ENCOUNTER — Other Ambulatory Visit: Payer: Self-pay | Admitting: Family Medicine

## 2013-10-10 ENCOUNTER — Encounter: Payer: Self-pay | Admitting: Family Medicine

## 2013-10-10 ENCOUNTER — Ambulatory Visit (INDEPENDENT_AMBULATORY_CARE_PROVIDER_SITE_OTHER): Payer: Medicare Other | Admitting: Family Medicine

## 2013-10-10 VITALS — BP 178/81 | HR 57 | Temp 98.0°F | Ht 67.0 in | Wt 200.0 lb

## 2013-10-10 DIAGNOSIS — L600 Ingrowing nail: Secondary | ICD-10-CM | POA: Diagnosis not present

## 2013-10-10 NOTE — Assessment & Plan Note (Addendum)
  ASSESSMENT: ingrown toenail  PLAN: Informed consent is obtained. Using 1% plain lidocaine, a ring block was done (10 cc total). Using a tourniquet for hemostasis and sterile instruments, I freed the nail from the nail bed and removed a wedge of the nail including the ingrown portion to the level of the nail skin fold. This was well tolerated, minimal bleeding. Antibiotic ointment and a dressing are applied. . Remove the dressing tomorrow and begin frequent soaks, complete his antibiotics and have a follow up visit in a week. Call if pain, erythema fever or bleeding. Wound care and dressing instructions are given.  Minimal Paronychia present, no need for systemic antibiotics.

## 2013-10-10 NOTE — Progress Notes (Signed)
[name] is a 80 y.o. male who presents today for an ingrown toenail on the R 1st phalanx.  This has been ongoing now for the last two weeks, pain has been increasing, denies any erythema or purulent drainage.  Has never had before, but has noticed the nail has increased in convexity.  Has not tried anything for the pain.   Past Medical History  Diagnosis Date  . Diverticulitis     recurrent  . Tinnitus   . Hearing aid worn   . Kidney stone     lithotripsy [id number] w complications, req stents, Dr Rosana Hoes  . Prostate ca   . Complication of anesthesia     " HAD TREMERS AFTER PROSTATE SURGERY"  . Family history of anesthesia complication     MOTHER   . Coronary artery disease   . Anginal pain   . Hypertension   . Hyperlipemia   . Anxiety     " OCCASIONAL"  . Asthma due to seasonal allergies     uses inhalers prn  . Shortness of breath   . TIA (transient ischemic attack)   . GERD (gastroesophageal reflux disease)   . H/O hiatal hernia   . Arthritis     History  Smoking status  . Former Smoker -- 0.50 packs/day  . Types: Cigarettes  . Quit date: 08/28/1996  Smokeless tobacco  . Never Used    No family history on file.  Current Outpatient Prescriptions on File Prior to Visit  Medication Sig Dispense Refill  . albuterol (PROVENTIL HFA) 108 (90 BASE) MCG/ACT inhaler Inhale 2 puffs into the lungs 4 (four) times daily as needed for wheezing.       Marland Kitchen amLODipine (NORVASC) 5 MG tablet Take 5 mg by mouth daily.      . Artificial Tear Solution (BION TEARS OP) Place 1 drop into both eyes daily as needed (dry eyes).      Marland Kitchen aspirin (BAYER ASPIRIN) 325 MG tablet Take 325 mg by mouth daily.        Marland Kitchen atorvastatin (LIPITOR) 40 MG tablet Take 1 tablet (40 mg total) by mouth daily.  30 tablet  3  . carvedilol (COREG) 3.125 MG tablet Take 1 tablet (3.125 mg total) by mouth 2 (two) times daily with a meal.  60 tablet  2  . cetirizine (ZYRTEC) 10 MG tablet Take 10 mg by mouth daily. As needed for  allergies       . cholecalciferol (VITAMIN D) 1000 UNITS tablet Take 1,000 Units by mouth daily.      . clonazePAM (KLONOPIN) 0.5 MG tablet Take 0.25-0.5 mg by mouth 2 (two) times daily as needed for anxiety. Pt takes .25 mg during the day and 0.5 mg at bedtime      . Cyanocobalamin (VITAMIN B-12 PO) Take 1 tablet by mouth daily.      Noelle Penner IBUPROFEN PO Take 1-2 tablets by mouth every 6 (six) hours as needed (pain).      . hydrochlorothiazide (MICROZIDE) 12.5 MG capsule Take 12.5 mg by mouth daily.      Marland Kitchen HYDROcodone-acetaminophen (NORCO/VICODIN) 5-325 MG per tablet Take 1 tablet by mouth every 8 (eight) hours as needed.  90 tablet  0  . nitroGLYCERIN (NITROLINGUAL) 0.4 MG/SPRAY spray Place 1 spray under the tongue every 5 (five) minutes x 3 doses as needed for chest pain.      Marland Kitchen omeprazole (PRILOSEC) 20 MG capsule Take 2 capsules (40 mg total) by mouth daily. For  1-2 weeks, then resume previous dosing.  45 capsule  0  . POLYETHYLENE GLYCOL 3350 PO Take 17 g by mouth daily as needed (mild constipation).      . triamcinolone (KENALOG) 0.1 % lotion Apply 1 application topically daily. After shower       No current facility-administered medications on file prior to visit.    ROS: Per HPI.  All other systems reviewed and are negative.   Physical Exam Filed Vitals:   10/10/13 0840  BP: 178/81  Pulse: 57  Temp: 98 F (36.7 C)    Physical Examination: General appearance - alert, well appearing, and in no distress Heart - normal rate and regular rhythm Extremities - no pedal edema noted, R 1st phalanx with lateral cuticle

## 2013-10-10 NOTE — Patient Instructions (Signed)
Dressing Change A dressing is a material placed over wounds. It keeps the wound clean, dry, and protected from further injury.  BEFORE YOU BEGIN  Get your supplies together. Things you may need include:  Salt solution (saline).  Flexible gauze bandage.  Medicated cream.  Tape.  Gloves.  Belly (abdominal) pads.  Gauze squares.  Plastic bags.  Take pain medicine 30 minutes before the bandage change if you need it.  Take a shower before you do the first bandage change of the day. Put plastic wrap or a bag over the dressing. REMOVING YOUR OLD BANDAGE  Wash your hands with soap and water. Dry your hands with a clean towel.  Put on your gloves.  Remove any tape.  Remove the old bandage as told. If it sticks, put a small amount of warm water on it to loosen the bandage.  Remove any gauze or packing tape in your wound.  Take off your gloves.  Put the gloves, tape, gauze, or any packing tape in a plastic bag. CHANGING YOUR BANDAGE  Open the supplies.  Take the cap off the salt solution.  Open the gauze. Leave the gauze on the inside of the package.  Put on your gloves.  Clean your wound as told by your doctor.  Keep your wound dry if your doctor told you to do so.  Your doctor may tell you to do one or more of the following:  Pick up the gauze. Pour the salt solution over the gauze. Squeeze out the extra salt solution.  Put medicated cream or other medicine on your wound.  Put solution soaked gauze only in your wound, not on the skin around it.  Pack your wound loosely.  Put dry gauze on your wound.  Put belly pads over the dry gauze if your bandages soak through.  Tape the bandage in place so it will not fall off. Do not wrap the tape all the way around your arm or leg.  Wrap the bandage with the flexibe gauze bandage as told by your doctor.  Take off your gloves. Put them in the plastic bag with the old bandage. Tie the bag shut and throw it  away.  Keep the bandage clean and dry.  Wash your hands. GET HELP RIGHT AWAY IF:   Your skin around the wound looks red.  Your wound feels more tender or sore.  You see yellowish-white fluid (pus) in the wound.  Your wound smells bad.  You have a fever.  Your skin around the wound has a red rash that itches and burns.  You see black or yellow skin in your wound that was not there before.  You feel sick to your stomach (nauseous), throw up (vomit), and feel very tired. Document Released: 09/25/2008 Document Revised: 09/21/2011 Document Reviewed: 05/10/2011 [facility] Patient Information 2014 [place], [place].

## 2013-10-18 ENCOUNTER — Ambulatory Visit (INDEPENDENT_AMBULATORY_CARE_PROVIDER_SITE_OTHER): Payer: Medicare Other | Admitting: Family Medicine

## 2013-10-18 ENCOUNTER — Encounter: Payer: Self-pay | Admitting: Family Medicine

## 2013-10-18 VITALS — BP 142/70 | HR 53 | Temp 97.9°F | Ht 67.0 in | Wt 197.0 lb

## 2013-10-18 DIAGNOSIS — I200 Unstable angina: Secondary | ICD-10-CM | POA: Diagnosis not present

## 2013-10-18 DIAGNOSIS — I2510 Atherosclerotic heart disease of native coronary artery without angina pectoris: Secondary | ICD-10-CM

## 2013-10-18 DIAGNOSIS — E785 Hyperlipidemia, unspecified: Secondary | ICD-10-CM

## 2013-10-18 DIAGNOSIS — G4710 Hypersomnia, unspecified: Secondary | ICD-10-CM

## 2013-10-18 DIAGNOSIS — R5383 Other fatigue: Secondary | ICD-10-CM

## 2013-10-18 DIAGNOSIS — G4733 Obstructive sleep apnea (adult) (pediatric): Secondary | ICD-10-CM | POA: Insufficient documentation

## 2013-10-18 DIAGNOSIS — R5381 Other malaise: Secondary | ICD-10-CM

## 2013-10-18 DIAGNOSIS — R400 Somnolence: Secondary | ICD-10-CM

## 2013-10-18 LAB — CBC WITH DIFFERENTIAL/PLATELET
BASOS ABS: 0.1 10*3/uL (ref 0.0–0.1)
BASOS PCT: 1 % (ref 0–1)
Eosinophils Absolute: 0.6 10*3/uL (ref 0.0–0.7)
Eosinophils Relative: 10 % — ABNORMAL HIGH (ref 0–5)
HEMATOCRIT: 41.4 % (ref 39.0–52.0)
Hemoglobin: 15 g/dL (ref 13.0–17.0)
Lymphocytes Relative: 27 % (ref 12–46)
Lymphs Abs: 1.6 10*3/uL (ref 0.7–4.0)
MCH: 35.1 pg — ABNORMAL HIGH (ref 26.0–34.0)
MCHC: 36.2 g/dL — AB (ref 30.0–36.0)
MCV: 97 fL (ref 78.0–100.0)
MONO ABS: 0.8 10*3/uL (ref 0.1–1.0)
Monocytes Relative: 14 % — ABNORMAL HIGH (ref 3–12)
NEUTROS ABS: 2.8 10*3/uL (ref 1.7–7.7)
Neutrophils Relative %: 48 % (ref 43–77)
Platelets: 144 10*3/uL — ABNORMAL LOW (ref 150–400)
RBC: 4.27 MIL/uL (ref 4.22–5.81)
RDW: 14.5 % (ref 11.5–15.5)
WBC: 5.9 10*3/uL (ref 4.0–10.5)

## 2013-10-18 NOTE — Patient Instructions (Signed)
I will send you a note about your labs See me back in 2-3 months Great to see you!

## 2013-10-19 ENCOUNTER — Encounter: Payer: Self-pay | Admitting: Family Medicine

## 2013-10-19 LAB — TESTOSTERONE: Testosterone: 382 ng/dL (ref 300–890)

## 2013-10-19 LAB — TSH: TSH: 7.803 u[IU]/mL — AB (ref 0.350–4.500)

## 2013-10-19 MED ORDER — HYDROCODONE-ACETAMINOPHEN 5-325 MG PO TABS: 1.0000 | ORAL_TABLET | Freq: Three times a day (TID) | ORAL | Status: DC | PRN

## 2013-10-19 NOTE — Assessment & Plan Note (Signed)
Continue statin. 

## 2013-10-19 NOTE — Assessment & Plan Note (Signed)
They questions about blood pressure medicine. He scuff followup with his new cardiologist, I'll let his cardiologist make further recommendations and changes about that.

## 2013-10-19 NOTE — Progress Notes (Signed)
   Subjective:    Patient ID: [name], male    DOB: Oct 20, 1933, 80 y.o.   MRN: [id number]  HPI  Followup recent consultation for chest pain. Cath was significant for moderate proximal RCA stenosis that was read as not flow-limiting. Cardiology consult diagnosing likely intermediate chest pain syndrome and recommended medical management by optimizing BP, cholesterol, and antiplatelet He thinks in retrospect his chest pain was likely related to gastroesophageal reflux.  #2. Left shoulder pain. I had given him an injection about a year ago which helped for several months. He would like to pursue that option again. #3. He is here with his wife. She tells that he sleeping an excessive amount during the daytime. The get up and take it 2 or 3 hour nap after breakfast. He says he just sleepy all the time.    Review of Systems No fever, sweats, chills, unusual weight change. No chest pain, no lower extremity edema. No shortness of breath with exertion that is different from his baseline. See history of present illness above for additional pertinent review of systems.    Objective:   Physical Exam Vital signs are reviewed GENERAL: Well-developed male no acute distress SHOULDER: Left. Full range of motion. Positive impingement signs. Full-strength rotator cuff. Distally neurovascularly intact CARDIOVASCULAR: Regular rate and rhythm without murmur LUNGS: Clear to auscultation bilaterally       Assessment & Plan:

## 2013-10-19 NOTE — Assessment & Plan Note (Signed)
Set up sleep study the

## 2013-10-25 ENCOUNTER — Telehealth: Payer: Self-pay | Admitting: Family Medicine

## 2013-10-25 ENCOUNTER — Encounter: Payer: Self-pay | Admitting: Cardiology

## 2013-10-25 ENCOUNTER — Ambulatory Visit (INDEPENDENT_AMBULATORY_CARE_PROVIDER_SITE_OTHER): Payer: Medicare Other | Admitting: Cardiology

## 2013-10-25 VITALS — BP 132/70 | HR 57 | Ht 67.0 in | Wt 195.0 lb

## 2013-10-25 DIAGNOSIS — E785 Hyperlipidemia, unspecified: Secondary | ICD-10-CM | POA: Diagnosis not present

## 2013-10-25 DIAGNOSIS — I200 Unstable angina: Secondary | ICD-10-CM | POA: Diagnosis not present

## 2013-10-25 DIAGNOSIS — I2510 Atherosclerotic heart disease of native coronary artery without angina pectoris: Secondary | ICD-10-CM | POA: Diagnosis not present

## 2013-10-25 DIAGNOSIS — I10 Essential (primary) hypertension: Secondary | ICD-10-CM

## 2013-10-25 NOTE — Patient Instructions (Addendum)
Continue your risk factor modification  Reduce amlodipine to 2.5 mg daily  I will see you in one year.

## 2013-10-25 NOTE — Telephone Encounter (Signed)
Pt called and needs a refill on his Vicodin left up front he will be here today. jw

## 2013-10-25 NOTE — Progress Notes (Signed)
[name] Date of Birth: 07-Jun-1934 Medical Record [id number]  History of Present Illness: [name] is seen for follow up after recent hospitalization with atypical chest pain. He ruled out for MI. Cardiac cath was done and demonstrated a 70% stenosis of the proximal RCA. FFR was 0.82. It was felt that this lesion was not flow limiting and he was treated medically. Coreg was started at a low dose. He reports he is doing well now. No chest pain or SOB. Tolerating meds well.  Current Outpatient Prescriptions on File Prior to Visit  Medication Sig Dispense Refill  . albuterol (PROVENTIL HFA) 108 (90 BASE) MCG/ACT inhaler Inhale 2 puffs into the lungs 4 (four) times daily as needed for wheezing.       Marland Kitchen amLODipine (NORVASC) 5 MG tablet Take 5 mg by mouth daily.      . Artificial Tear Solution (BION TEARS OP) Place 1 drop into both eyes daily as needed (dry eyes).      Marland Kitchen aspirin (BAYER ASPIRIN) 325 MG tablet Take 325 mg by mouth daily. Pt stated he alternates the 325 MG with the (2) two 81 MG tablets      . carvedilol (COREG) 3.125 MG tablet Take 1 tablet (3.125 mg total) by mouth 2 (two) times daily with a meal.  60 tablet  2  . cetirizine (ZYRTEC) 10 MG tablet Take 10 mg by mouth daily. As needed for allergies       . cholecalciferol (VITAMIN D) 1000 UNITS tablet Take 1,000 Units by mouth daily.      . Cyanocobalamin (VITAMIN B-12 PO) Take 1 tablet by mouth daily.      [name] IBUPROFEN PO Take 1-2 tablets by mouth every 6 (six) hours as needed (pain).      Marland Kitchen HYDROcodone-acetaminophen (NORCO/VICODIN) 5-325 MG per tablet Take 1 tablet by mouth every 8 (eight) hours as needed.  90 tablet  0  . losartan-hydrochlorothiazide (HYZAAR) 100-12.5 MG per tablet TAKE 1 TABLET BY MOUTH EVERY DAY  90 tablet  3  . nitroGLYCERIN (NITROLINGUAL) 0.4 MG/SPRAY spray Place 1 spray under the tongue every 5 (five) minutes x 3 doses as needed for chest pain.      Marland Kitchen omeprazole (PRILOSEC) 20 MG capsule Take 2 capsules  (40 mg total) by mouth daily. For 1-2 weeks, then resume previous dosing.  45 capsule  0  . POLYETHYLENE GLYCOL 3350 PO Take 17 g by mouth daily as needed (mild constipation).      . triamcinolone (KENALOG) 0.1 % lotion Apply 1 application topically daily. After shower       No current facility-administered medications on file prior to visit.    Allergies  Allergen Reactions  . Other Other (See Comments)    Ct prefers not to have muscle relaxer's   . Sulfonamide Derivatives Other (See Comments)    Chills and shaking      Past Medical History  Diagnosis Date  . Diverticulitis     recurrent  . Tinnitus   . Hearing aid worn   . Kidney stone     lithotripsy [id number] w complications, req stents, Dr Rosana Hoes  . Prostate ca   . Complication of anesthesia     " HAD TREMERS AFTER PROSTATE SURGERY"  . Family history of anesthesia complication     MOTHER   . Coronary artery disease   . Anginal pain   . Hypertension   . Hyperlipemia   . Anxiety     " OCCASIONAL"  .  Asthma due to seasonal allergies     uses inhalers prn  . Shortness of breath   . TIA (transient ischemic attack)   . GERD (gastroesophageal reflux disease)   . H/O hiatal hernia   . Arthritis     Past Surgical History  Procedure Laterality Date  . Carotid endarterectomy  RIGHT    1998  . Prostatectomy  1998    radical for prostate cancer  . Cardiac catheterization  2008    minimal dz, Dr Cathie Olden  . Cholecystectomy      History  Smoking status  . Former Smoker -- 0.50 packs/day  . Types: Cigarettes  . Quit date: 08/28/1996  Smokeless tobacco  . Never Used    History  Alcohol Use  . 1.0 oz/week  . 2 drink(s) per week    Comment: [place]    History reviewed. No pertinent family history.  Review of Systems: As noted in HPI.  All other systems were reviewed and are negative.  Physical Exam: BP 132/70  Pulse 57  Ht 5\' 7"  (1.702 m)  Wt 195 lb (88.451 kg)  BMI 30.53 kg/m2  SpO2 99% Overweight WM in  NAD HEENT: [place]/AT,PERRL, EOMI, oropharynx is clear.  Neck: supple without JVD, adenopathy, or bruits. Lungs: clear CV: RRR, normal S1-2, no gallop or murmur. Abd: soft NT, BS +. No HSM or masses. Ext: no edema. Normal pulses. No radial hematoma Neuro: nonfocal.  LABORATORY DATA:   Assessment / Plan: 1. CAD with moderate proximal RCA stenosis. FFR suggests this is not hemodynamically significant. Will continue coreg. Further titration limited by bradycardia. Reduce amlodipine to 2.5 mg daily due to swelling. Continue risk factor modification with weight loss, aerobic exercise, and Mediterranean diet. I will follow up in one year.  2. Dyslipidemia. On atorvastatin 40 mg daily  3. HTN controlled.

## 2013-10-26 ENCOUNTER — Other Ambulatory Visit: Payer: Self-pay | Admitting: Family Medicine

## 2013-10-26 ENCOUNTER — Encounter: Payer: Self-pay | Admitting: Family Medicine

## 2013-10-26 DIAGNOSIS — E039 Hypothyroidism, unspecified: Secondary | ICD-10-CM | POA: Insufficient documentation

## 2013-10-26 MED ORDER — LEVOTHYROXINE SODIUM 50 MCG PO TABS: 50.0000 ug | ORAL_TABLET | Freq: Every day | ORAL | Status: DC

## 2013-10-26 MED ORDER — HYDROCODONE-ACETAMINOPHEN 5-325 MG PO TABS: 1.0000 | ORAL_TABLET | Freq: Three times a day (TID) | ORAL | Status: DC | PRN

## 2013-10-26 NOTE — Telephone Encounter (Signed)
Dear Dema Severin Team It is up front [facility]! Dickie La

## 2013-10-26 NOTE — Progress Notes (Signed)
Low energy with sl abnormal TSh. Will start at 50 mcg and recheck in 6-8 weeks  we are also getting sleep study [name]

## 2013-10-26 NOTE — Telephone Encounter (Signed)
Called pt. Informed. [name]

## 2013-11-09 DIAGNOSIS — H524 Presbyopia: Secondary | ICD-10-CM | POA: Diagnosis not present

## 2013-11-09 DIAGNOSIS — H5200 Hypermetropia, unspecified eye: Secondary | ICD-10-CM | POA: Diagnosis not present

## 2013-11-09 DIAGNOSIS — H40059 Ocular hypertension, unspecified eye: Secondary | ICD-10-CM | POA: Diagnosis not present

## 2013-11-09 DIAGNOSIS — H52229 Regular astigmatism, unspecified eye: Secondary | ICD-10-CM | POA: Diagnosis not present

## 2013-11-13 ENCOUNTER — Ambulatory Visit (INDEPENDENT_AMBULATORY_CARE_PROVIDER_SITE_OTHER): Payer: Medicare Other | Admitting: Family Medicine

## 2013-11-13 ENCOUNTER — Encounter: Payer: Self-pay | Admitting: Family Medicine

## 2013-11-13 VITALS — BP 163/79 | HR 79 | Temp 98.6°F | Ht 67.0 in | Wt 195.0 lb

## 2013-11-13 DIAGNOSIS — R32 Unspecified urinary incontinence: Secondary | ICD-10-CM | POA: Diagnosis not present

## 2013-11-13 DIAGNOSIS — J069 Acute upper respiratory infection, unspecified: Secondary | ICD-10-CM | POA: Diagnosis not present

## 2013-11-13 DIAGNOSIS — B9789 Other viral agents as the cause of diseases classified elsewhere: Principal | ICD-10-CM

## 2013-11-13 MED ORDER — ALBUTEROL SULFATE HFA 108 (90 BASE) MCG/ACT IN AERS: 2.0000 | INHALATION_SPRAY | RESPIRATORY_TRACT | Status: DC | PRN

## 2013-11-13 MED ORDER — BENZONATATE 100 MG PO CAPS: 100.0000 mg | ORAL_CAPSULE | Freq: Three times a day (TID) | ORAL | Status: DC | PRN

## 2013-11-13 NOTE — Patient Instructions (Addendum)
It was nice to meet you today!  I think you most likely have a virus causing your cough and bronchitis. - I sent in an albuterol inhaler for you. Use this every 4 hours as needed. - Continue using mucinex twice per day. - Use nasal saline spray for nasal congestion - I also sent in tessalon, which is a medicine to help suppress cough.  Return if you have a fever, you are not getting better within a few days, or if you feel that you are worsening. Please also return if the urinary incontinence or dizziness continue beyond today.  Be well, Dr. Ardelia Mems  Bronchitis Bronchitis is inflammation of the airways that extend from the windpipe into the lungs (bronchi). The inflammation often causes mucus to develop, which leads to a cough. If the inflammation becomes severe, it may cause shortness of breath. CAUSES  Bronchitis may be caused by:   Viral infections.   Bacteria.   Cigarette smoke.   Allergens, pollutants, and other irritants.  SIGNS AND SYMPTOMS  The most common symptom of bronchitis is a frequent cough that produces mucus. Other symptoms include:  Fever.   Body aches.   Chest congestion.   Chills.   Shortness of breath.   Sore throat.  DIAGNOSIS  Bronchitis is usually diagnosed through a medical history and physical exam. Tests, such as chest X-rays, are sometimes done to rule out other conditions.  TREATMENT  You may need to avoid contact with whatever caused the problem (smoking, for example). Medicines are sometimes needed. These may include:  Antibiotics. These may be prescribed if the condition is caused by bacteria.  Cough suppressants. These may be prescribed for relief of cough symptoms.   Inhaled medicines. These may be prescribed to help open your airways and make it easier for you to breathe.   Steroid medicines. These may be prescribed for those with recurrent (chronic) bronchitis. HOME CARE INSTRUCTIONS  Get plenty of rest.   Drink  enough fluids to keep your urine clear or pale yellow (unless you have a medical condition that requires fluid restriction). Increasing fluids may help thin your secretions and will prevent dehydration.   Only take over-the-counter or prescription medicines as directed by your health care provider.  Only take antibiotics as directed. Make sure you finish them even if you start to feel better.  Avoid secondhand smoke, irritating chemicals, and strong fumes. These will make bronchitis worse. If you are a smoker, quit smoking. Consider using nicotine gum or skin patches to help control withdrawal symptoms. Quitting smoking will help your lungs heal faster.   Put a cool-mist humidifier in your bedroom at night to moisten the air. This may help loosen mucus. Change the water in the humidifier daily. You can also run the hot water in your shower and sit in the bathroom with the door closed for 5 10 minutes.   Follow up with your health care provider as directed.   Wash your hands frequently to avoid catching bronchitis again or spreading an infection to others.  SEEK MEDICAL CARE IF: Your symptoms do not improve after 1 week of treatment.  SEEK IMMEDIATE MEDICAL CARE IF:  Your fever increases.  You have chills.   You have chest pain.   You have worsening shortness of breath.   You have bloody sputum.  You faint.  You have lightheadedness.  You have a severe headache.   You vomit repeatedly. MAKE SURE YOU:   Understand these instructions.  Will watch your  condition.  Will get help right away if you are not doing well or get worse. Document Released: 06/29/2005 Document Revised: 04/19/2013 Document Reviewed: 02/21/2013 [facility] Patient Information 2014 [place].

## 2013-11-13 NOTE — Progress Notes (Signed)
Patient ID: [name], male   DOB: 1934/02/16, 80 y.o.   MRN: [id number]  HPI:  Pt presents for a same day appointment to discuss cough x 1 week.   One week ago returned from trip to [place] where he visited his four grandchildren, who were all sick with respiratory illnesses. He began to feel ill about one day after returning. Has had a constant cough the last 4 days. Has had decreased drinking and eating. Is able to urinate but states he was incontinent overnight and this morning, which is not something he normally has a problem with. He denies dysuria. His prostate has been surgically removed in the past. He attributes the urinary incontinence to having taken a variety of medications yesterday, including but not limited to expired >29 year old hydrocodone syrup. Has also taken varieties of mucinex, tessalon, etc but thinks some of these were expired as well. This morning has felt lightheaded/dizzy, which he also attributes to the medication as it the dizzy feeling is starting to wear off and he's feeling more back to normal. He also used an old albuterol inhaler from 2009, which he doesn't think helped. Has not had a fever. Cough was originally productive about 20-30% of the time but is now dry. His cough symptoms generally have not improved over the last week. Has also had some nasal congestion. Has hx of sinusitis but states this feels different than that. Denies chest pain or lower extremity edema. Has had some wheezing. Has tried Mucinex DM and an expectorant which helped with making his cough productive. His symptoms are not better at all since starting one week ago.  ROS: See HPI  [place]: hx HTN, CAD Previously a heavy smoker but quit 30 years ago  PHYSICAL EXAM: BP 163/79  Pulse 79  Temp(Src) 98.6 F (37 C) (Oral)  Ht 5\' 7"  (1.702 m)  Wt 195 lb (88.451 kg)  BMI 30.53 kg/m2  SpO2 95% Gen: NAD HEENT: NCAT, TM's clear bilaterally with some cerumen present in bilateral canals, oropharynx  erythematous but no exudates, some shotty anterior cervical LAD.  Heart: RRR, no murmurs Lungs: frequent cough present. Able to speak in full sentences. Good air movement throughout all lung fields. Some mild expiratory wheeze present. No focal crackles or areas of diminished air movement. Neuro: grossly nonfocal, speech normal. Walks with steady even gait. Alert and oriented x3.  ASSESSMENT/PLAN:  # Cough: given hx and nonfocality of exam, favor viral eitology - rx albuterol inhaler and tessalon perles for symptomatic relief with cough - continue mucinex - nasal saline spray in the event this is a postnasal drip causing him to cough - given return precautions, instructed pt to f/u later this week in clinic if not improving, or if urinary incontinence/dizziness persist.  FOLLOW UP: F/u as needed if symptoms worsen or do not improve.   [place]. Ardelia Mems, [place]

## 2013-11-22 ENCOUNTER — Ambulatory Visit (INDEPENDENT_AMBULATORY_CARE_PROVIDER_SITE_OTHER): Payer: Medicare Other | Admitting: Family Medicine

## 2013-11-22 ENCOUNTER — Encounter: Payer: Self-pay | Admitting: Family Medicine

## 2013-11-22 VITALS — BP 142/78 | HR 56 | Temp 98.0°F | Ht 67.0 in | Wt 193.6 lb

## 2013-11-22 DIAGNOSIS — R059 Cough, unspecified: Secondary | ICD-10-CM

## 2013-11-22 DIAGNOSIS — I200 Unstable angina: Secondary | ICD-10-CM | POA: Diagnosis not present

## 2013-11-22 DIAGNOSIS — R05 Cough: Secondary | ICD-10-CM

## 2013-11-22 MED ORDER — FLUTICASONE PROPIONATE 50 MCG/ACT NA SUSP: 2.0000 | Freq: Two times a day (BID) | NASAL | Status: DC

## 2013-11-22 MED ORDER — CLONAZEPAM 0.5 MG PO TABS: 0.5000 mg | ORAL_TABLET | Freq: Every day | ORAL | Status: DC

## 2013-11-27 ENCOUNTER — Ambulatory Visit (HOSPITAL_BASED_OUTPATIENT_CLINIC_OR_DEPARTMENT_OTHER): Payer: Medicare Other | Attending: Family Medicine | Admitting: Radiology

## 2013-11-27 VITALS — Ht 67.0 in | Wt 191.0 lb

## 2013-11-27 DIAGNOSIS — G4733 Obstructive sleep apnea (adult) (pediatric): Secondary | ICD-10-CM | POA: Diagnosis not present

## 2013-11-27 DIAGNOSIS — G4761 Periodic limb movement disorder: Secondary | ICD-10-CM | POA: Insufficient documentation

## 2013-11-27 DIAGNOSIS — R400 Somnolence: Secondary | ICD-10-CM

## 2013-11-27 NOTE — Progress Notes (Signed)
   Subjective:    Patient ID: [name], male    DOB: 08-Jun-1934, 80 y.o.   MRN: [id number]  HPI Complaining of 2 weeks of hacking dry cough that is minimally productive of some tan sputum. Particularly bothers him at night. Is also having a lot of postnasal drip. No shortness of breath.   Review of Systems No fever, sweats, chills.    Objective:   Physical Exam  Vital signs are reviewed GENERAL: Well-developed male no acute distress HEENT: TMs bilaterally are normal. Oropharynx shows some evidence of drainage in the posterior portion otherwise normal without any sign of infection. No lymphadenopathy in the neck. LUNGS: Clear to auscultation bilaterally      Assessment & Plan:  Cough likely secondary to postnasal drip. We'll start him on Flonase. #2. He has upcoming sleep study and has not been sleeping well secondary to his cough so I urged him to be sure and take his clonazepam that is all ready scheduled before he goes for his visit.

## 2013-12-02 DIAGNOSIS — G4710 Hypersomnia, unspecified: Secondary | ICD-10-CM | POA: Diagnosis not present

## 2013-12-02 DIAGNOSIS — G4730 Sleep apnea, unspecified: Secondary | ICD-10-CM

## 2013-12-02 DIAGNOSIS — G4733 Obstructive sleep apnea (adult) (pediatric): Secondary | ICD-10-CM | POA: Diagnosis not present

## 2013-12-02 NOTE — Sleep Study (Signed)
   NAME: [name] DATE OF BIRTH:  10-10-1933 MEDICAL RECORD NUMBER [id number]  LOCATION: [facility]  PHYSICIAN: Clinton D Young  DATE OF STUDY: 11/27/2013  SLEEP STUDY TYPE: Nocturnal Polysomnogram               REFERRING PHYSICIAN: Dickie La, MD  INDICATION FOR STUDY: Hypersomnia with sleep apnea  EPWORTH SLEEPINESS SCORE:   5/24 HEIGHT: 5\' 7"  (170.2 cm)  WEIGHT: 191 lb (86.637 kg)    Body mass index is 29.91 kg/(m^2).  NECK SIZE: 16 in.  MEDICATIONS: Charted for review  SLEEP ARCHITECTURE: Split study protocol. During the diagnostic phase, total sleep time 120.5 minutes with sleep efficiency 72.6%. Stage I was 7.5%, stage II 63.5%, stage III 11.6%, REM 17.4% of total sleep time. Sleep latency 27 minutes, REM latency 118 minutes, awake after sleep onset 20 minutes, arousal index 27.9. Bedtime medication: Klonopin  RESPIRATORY DATA: Apnea hypopnea index (AHI) 16.4 per hour. 33 total events scored including one obstructive apnea and 32 hypopneas. Events were associated with supine sleep position. REM AHI 37.1 per hour. CPAP was titrated to 12 CWP, AHI 0 per hour. He wore a medium ResMed air fit F. 10 fullface mask with heated humidifier.  OXYGEN DATA: Moderate snoring before CPAP with oxygen desaturation to a nadir of 87% on room air. With CPAP control, snoring was prevented and mean oxygen saturation held at 95.2% on room air.  CARDIAC DATA: Sinus rhythm  MOVEMENT/PARASOMNIA: Before CPAP a total of 15 limb jerks were counted of which 4 were associated with arousals or awakenings for periodic limb movement with arousal index of 2 per hour. During CPAP titration, a total of 111 limb jerks were counted of which 32 were associated with arousals or awakenings for periodic limb movement with arousal index of 12.7 per hour. No bathroom trips  IMPRESSION/ RECOMMENDATION:   1) Moderate obstructive sleep apnea/hypopnea syndrome, AHI 16.4 per hour with supine events.  Moderate snoring with oxygen desaturation to a nadir of 87% on room air. 2) Successful CPAP titration to 12 CWP, AHI 0 per hour.  He wore a Resmed AirFit F10 full face mask with heated humidifier.Snoring was prevented and oxygen saturation of 95.2% on room air. 3) Periodic Limb Movement Syndrome with arousal. During the diagnostic phase a total of 15 limb jerks were counted of which 4 were associated with arousal or waking for periodic limb movement with arousal index of 2 per hour. During the titration phase, as is common, there were more events, with a total of 111 limb jerks of which 32 were associated with arousals or awakenings for an index of 12.7 per hour. If limb jerks are important cause of sleep disturbance in the home environment than specific therapy such as Requip or Mirapex might be considered. 4) He describes excessive morning sleepiness after he has gotten up, requiring that he go back to bed. Consider if this might reflect the long half-life of Klonopin taken at bedtime.   Signed Clinton D. Young M.D. [place], [employer] [facility] of [employer] Medicine  ELECTRONICALLY SIGNED ON:  12/02/2013, 1:33 PM [place] PH: [phone]   FX: [phone] [place]

## 2013-12-06 ENCOUNTER — Telehealth: Payer: Self-pay | Admitting: Family Medicine

## 2013-12-06 ENCOUNTER — Other Ambulatory Visit: Payer: Self-pay | Admitting: Family Medicine

## 2013-12-06 NOTE — Telephone Encounter (Signed)
Originally prescribed to patient in hospital by Dr. Darene Lamer, requesting refill on carvedilol. He would like this sent in as a 90 supply. Please call him once completed.

## 2013-12-07 MED ORDER — CARVEDILOL 3.125 MG PO TABS: 3.1250 mg | ORAL_TABLET | Freq: Two times a day (BID) | ORAL | Status: DC

## 2013-12-07 NOTE — Telephone Encounter (Signed)
Dear [name] Team plz let him know this has been done [facility]! Dickie La

## 2013-12-07 NOTE — Telephone Encounter (Signed)
Spoke with patient's husband and informed her of below

## 2014-01-03 ENCOUNTER — Encounter: Payer: Self-pay | Admitting: Family Medicine

## 2014-01-03 ENCOUNTER — Ambulatory Visit (INDEPENDENT_AMBULATORY_CARE_PROVIDER_SITE_OTHER): Payer: Medicare Other | Admitting: Family Medicine

## 2014-01-03 VITALS — BP 120/70 | HR 62 | Temp 98.1°F | Ht 67.0 in | Wt 188.1 lb

## 2014-01-03 DIAGNOSIS — G4733 Obstructive sleep apnea (adult) (pediatric): Secondary | ICD-10-CM

## 2014-01-03 DIAGNOSIS — I2510 Atherosclerotic heart disease of native coronary artery without angina pectoris: Secondary | ICD-10-CM

## 2014-01-03 DIAGNOSIS — I200 Unstable angina: Secondary | ICD-10-CM

## 2014-01-03 DIAGNOSIS — E039 Hypothyroidism, unspecified: Secondary | ICD-10-CM | POA: Diagnosis not present

## 2014-01-03 LAB — TSH: TSH: 3.319 u[IU]/mL (ref 0.350–4.500)

## 2014-01-03 MED ORDER — ECONAZOLE NITRATE 1 % EX CREA: TOPICAL_CREAM | Freq: Every day | CUTANEOUS | Status: DC

## 2014-01-03 MED ORDER — HYDROCODONE-ACETAMINOPHEN 5-325 MG PO TABS: 1.0000 | ORAL_TABLET | Freq: Three times a day (TID) | ORAL | Status: DC | PRN

## 2014-01-03 MED ORDER — MOMETASONE FUROATE 0.1 % EX CREA: 1.0000 "application " | TOPICAL_CREAM | Freq: Every day | CUTANEOUS | Status: DC

## 2014-01-03 MED ORDER — AMLODIPINE BESYLATE 2.5 MG PO TABS: 2.5000 mg | ORAL_TABLET | Freq: Every day | ORAL | Status: DC

## 2014-01-03 NOTE — Progress Notes (Signed)
   Subjective:    Patient ID: [name], male    DOB: 23-May-1934, 80 y.o.   MRN: [id number]  HPI #1. Followup hypothyroidism. Take medicines regularly. Says he feels a little bit better overall #2. Followup sleep apnea. Somehow his CPAP machine never got set up. That everything that results from the study. #3. Hypertension. He is cardiologist had placed him on carvedilol. He's had a couple episodes of very low heart rate which is  is symptomatic. At this does not occur daily.   Review of Systems Pertinent review of systems: negative for fever or unusual weight change.     Objective:   Physical Exam Vital signs reviewed. GENERAL: Well-developed, well-nourished, no acute distress. CARDIOVASCULAR: Regular rate and rhythm no murmur gallop or rub HEENT: B cerumen impaction LUNGS: Clear to auscultation bilaterally, no rales or wheeze. ABDOMEN: Soft positive bowel sounds NEURO: decreased hearing B w use of hearing iads MSK: Movement of extremity x 4. Trace lower extremity edema b         Assessment & Plan:

## 2014-01-03 NOTE — Patient Instructions (Signed)
If you have not heard from [place] about CPAP set up in one week, call my office I will send you a note about your blood work Let me see you in 2 months

## 2014-01-03 NOTE — Assessment & Plan Note (Signed)
We replaced his thyroid starting 2 months ago so we'll recheck TSH today. He seems to be improved on this.

## 2014-01-03 NOTE — Assessment & Plan Note (Signed)
Cardiology recently added carvedilol. He 7 episodic bradycardia that is symptomatic but it is not occurring every day. Right now we'll just follow this. I think he would benefit from beta blocker if he can manage to tolerate

## 2014-01-16 ENCOUNTER — Telehealth: Payer: Self-pay | Admitting: Family Medicine

## 2014-01-16 NOTE — Telephone Encounter (Signed)
Pt called and would like to speak to [name] concerning one of his medication's that is coming up for a refill. He wanted to get the next dosage available. Please call him at [phone]. jw

## 2014-01-16 NOTE — Telephone Encounter (Signed)
Spoke with patient and he is wanting to know if he can go up on medication for TSH. He wants his number value  to go down to more of a 1.000 in stead of the current 3.319

## 2014-01-17 NOTE — Telephone Encounter (Signed)
Dear Dema Severin Team NO NO NO He needs to let me manage this! Stay on current dose. If he has questions about this he can come see me--thyroid replacement is complex. THANKS! Dorcas Mcmurray

## 2014-01-17 NOTE — Telephone Encounter (Signed)
Relayed message,patient had several questions.His comparing daughters treatment for her thyroid .At that point patient advised to schedule appointment.He voiced understanding.[name], [name]

## 2014-02-06 ENCOUNTER — Other Ambulatory Visit: Payer: Self-pay | Admitting: Family Medicine

## 2014-02-07 MED ORDER — CLONAZEPAM 0.5 MG PO TABS: 0.5000 mg | ORAL_TABLET | Freq: Every day | ORAL | Status: DC

## 2014-02-07 NOTE — Telephone Encounter (Signed)
Called in.

## 2014-02-07 NOTE — Telephone Encounter (Signed)
Dear Dema Severin Team Please call in w 5 refills THANKS! Dorcas Mcmurray

## 2014-02-26 DIAGNOSIS — E559 Vitamin D deficiency, unspecified: Secondary | ICD-10-CM | POA: Diagnosis not present

## 2014-03-21 ENCOUNTER — Ambulatory Visit (INDEPENDENT_AMBULATORY_CARE_PROVIDER_SITE_OTHER): Payer: Medicare Other | Admitting: Family Medicine

## 2014-03-21 ENCOUNTER — Encounter: Payer: Self-pay | Admitting: Family Medicine

## 2014-03-21 VITALS — BP 149/62 | HR 54 | Ht 67.0 in | Wt 199.0 lb

## 2014-03-21 DIAGNOSIS — I200 Unstable angina: Secondary | ICD-10-CM | POA: Diagnosis not present

## 2014-03-21 DIAGNOSIS — G4700 Insomnia, unspecified: Secondary | ICD-10-CM

## 2014-03-21 DIAGNOSIS — Z23 Encounter for immunization: Secondary | ICD-10-CM | POA: Diagnosis not present

## 2014-03-21 DIAGNOSIS — E039 Hypothyroidism, unspecified: Secondary | ICD-10-CM | POA: Diagnosis not present

## 2014-03-21 DIAGNOSIS — G4733 Obstructive sleep apnea (adult) (pediatric): Secondary | ICD-10-CM | POA: Diagnosis not present

## 2014-03-21 LAB — TSH: TSH: 0.861 u[IU]/mL (ref 0.350–4.500)

## 2014-03-21 MED ORDER — HYDROCODONE-ACETAMINOPHEN 5-325 MG PO TABS: 1.0000 | ORAL_TABLET | Freq: Three times a day (TID) | ORAL | Status: DC | PRN

## 2014-03-21 NOTE — Assessment & Plan Note (Signed)
Is taking 1/2-1/4 pill of Klonopin each bedtime when necessary. Sleeps well now with the CPAP. He does not take the Klonopin he has difficulty falling asleep but knows not had any problems maintaining sleep. He continues to be somnolent throughout the day but improved

## 2014-03-21 NOTE — Patient Instructions (Signed)
I will send you a note about your labs. I would like to see you back in 3 months. We will give you the updated Prevnar shot then

## 2014-03-21 NOTE — Assessment & Plan Note (Signed)
Has gotten the issues with his CPAP mask straightened out is using it regularly. He can tell a difference in his energy level although it's not back to where he wants to be. He still taking daily naps of greater than one hour. I discussed with or not they want to see a neurologist for further evaluation of sleep disorder but they declined. His wife seems to think it's related to his diet, specifically intake of glucose or half her dose seems to make him very sleepy. They decided to just try to modify his diet

## 2014-03-21 NOTE — Progress Notes (Signed)
   Subjective:    Patient ID: [name], male    DOB: 1933/07/31, 80 y.o.   MRN: [id number]  HPI Followup sleep apnea. Has gotten his issues with his sleep machine and his CPAP mask straightened out it is using it regularly. Feels somewhat better during the day but still has sleep issues as and difficulty falling asleep and then usually has to take at least one hour-long nap during the day. #2. Followup hypothyroidism. No problems w meds.   Marland Kitcheni ew of Systems Pertinent review of systems: negative for fever or unusual weight change.    Objective:   Physical Exam  Vital signs reviewed. GENERAL: Well-developed, well-nourished, no acute distress. CARDIOVASCULAR: Regular rate and rhythm no murmur gallop or rub LUNGS: Clear to auscultation bilaterally, no rales or wheeze. ABDOMEN: Soft positive bowel sounds NEURO: No gross focal neurological deficits. MSK: Movement of extremity x 4.        Assessment & Plan:

## 2014-03-21 NOTE — Assessment & Plan Note (Signed)
Long discussion re meds. Recheck TSH. He agrees to contiue to take as rx.

## 2014-03-27 ENCOUNTER — Encounter: Payer: Self-pay | Admitting: Family Medicine

## 2014-05-09 ENCOUNTER — Encounter: Payer: Self-pay | Admitting: Family Medicine

## 2014-05-09 NOTE — Progress Notes (Signed)
Patient ID: [name], male   DOB: Nov 09, 1933, 80 y.o.   MRN: [id number] Faxed office note from 03/21/2014 to [facility] re his OSA follow up visit Dorcas Mcmurray

## 2014-05-11 DIAGNOSIS — J209 Acute bronchitis, unspecified: Secondary | ICD-10-CM | POA: Diagnosis not present

## 2014-06-21 ENCOUNTER — Encounter (HOSPITAL_COMMUNITY): Payer: Self-pay | Admitting: Cardiology

## 2014-07-03 DIAGNOSIS — H6120 Impacted cerumen, unspecified ear: Secondary | ICD-10-CM | POA: Diagnosis not present

## 2014-07-17 DIAGNOSIS — H6123 Impacted cerumen, bilateral: Secondary | ICD-10-CM | POA: Diagnosis not present

## 2014-07-18 ENCOUNTER — Encounter: Payer: Self-pay | Admitting: Family Medicine

## 2014-07-18 ENCOUNTER — Ambulatory Visit (INDEPENDENT_AMBULATORY_CARE_PROVIDER_SITE_OTHER): Payer: Medicare Other | Admitting: Family Medicine

## 2014-07-18 VITALS — BP 132/76 | HR 62 | Temp 98.1°F | Ht 67.0 in | Wt 203.8 lb

## 2014-07-18 DIAGNOSIS — F411 Generalized anxiety disorder: Secondary | ICD-10-CM

## 2014-07-18 DIAGNOSIS — Z23 Encounter for immunization: Secondary | ICD-10-CM

## 2014-07-18 DIAGNOSIS — G4733 Obstructive sleep apnea (adult) (pediatric): Secondary | ICD-10-CM

## 2014-07-18 DIAGNOSIS — I10 Essential (primary) hypertension: Secondary | ICD-10-CM | POA: Diagnosis not present

## 2014-07-18 MED ORDER — HYDROCODONE-ACETAMINOPHEN 5-325 MG PO TABS: 1.0000 | ORAL_TABLET | Freq: Three times a day (TID) | ORAL | Status: DC | PRN

## 2014-07-19 ENCOUNTER — Encounter: Payer: Self-pay | Admitting: Family Medicine

## 2014-07-19 NOTE — Assessment & Plan Note (Signed)
Long discussion about CPAP use. I think he's convinced to go back and use it regularly.

## 2014-07-19 NOTE — Progress Notes (Signed)
   Subjective:    Patient ID: [name], male    DOB: February 24, 1934, 81 y.o.   MRN: [id number]  HPI #1. Schedule about CPAP. He had an episode of bronchitis and during this he thought he should stop using CPAP. Has some questions about whether not the mass could be contaminated then. He also noticed that he was having some chest soreness every morning and thought this might be related to the CPAP "making his lungs worked too hard". He did notice that when he stopped using CPAP he still had early morning chest soreness and this is not change so now he is back to using his CPAP regularly and admits he feels much better. Still has some intermittent sleep disturbances and occasionally uses a klonopin distracted. #2. Hypertension. Seems to be doing well with his current medicine regimen. No problems. No chest pain. No change in exercise tolerance. #3. Memory loss. This appears to be stable. He is here with his wife today and they both think he still having issues but they are managing quite well. No behavioral disturbances. #4. Hypothyroidism. He is taking his replacement therapy as directed and is feeling fine. #5. Occasional need for Vicodin for his muscle skeletal pain. Would like a refill. He is not using it daily but does like to have some of it on hand.   Review of Systems See history of present illness.    Objective:   Physical Exam Vital signs reviewed. GENERAL: Well-developed, well-nourished, no acute distress. CARDIOVASCULAR: Regular rate and rhythm no murmur gallop or rub LUNGS: Clear to auscultation bilaterally, no rales or wheeze. ABDOMEN: Soft positive bowel sounds MSK: Movement of extremity x 4. PSYCHIATRIC: Alert and oriented 4. Decreased hearing and he is using hearing aids. He asks and answers questions appropriately. Does not seem to have any word finding difficulty.         Assessment & Plan:

## 2014-07-19 NOTE — Assessment & Plan Note (Signed)
He seems to be doing pretty well right now on the lower dose of amlodipine, the low Sartin/HCTZ 100/12.5 and the carvedilol at 3.12 twice daily. No edema. No chest pain with exertion. No med changes.

## 2014-07-19 NOTE — Assessment & Plan Note (Signed)
6 like the episode of shortness of breath he is describing is most consistent with anxiety. We discussed at length. Urged him to use his clonazepam next time he has this and see if it helps.

## 2014-08-01 ENCOUNTER — Ambulatory Visit: Payer: Medicare Other | Admitting: Family Medicine

## 2014-10-01 ENCOUNTER — Other Ambulatory Visit: Payer: Self-pay | Admitting: *Deleted

## 2014-10-01 DIAGNOSIS — E039 Hypothyroidism, unspecified: Secondary | ICD-10-CM

## 2014-10-01 MED ORDER — LEVOTHYROXINE SODIUM 50 MCG PO TABS: 50.0000 ug | ORAL_TABLET | Freq: Every day | ORAL | Status: DC

## 2014-10-01 NOTE — Telephone Encounter (Signed)
Dear [name] Team Please let him know I have called in his synthroid refill but I want to check it again (TSh test). He can come by at his leisure in next f 2-3weeks--no fasting necessary--no appt necessary..Make sure he knows this is a LAB ONLY---I have already ordered this as a future order in the computer. Is already in for a lab only THANKS! Dorcas Mcmurray

## 2014-10-03 ENCOUNTER — Other Ambulatory Visit: Payer: Self-pay | Admitting: *Deleted

## 2014-10-03 MED ORDER — NITROGLYCERIN 0.4 MG/SPRAY TL SOLN: 1.0000 | Status: DC | PRN

## 2014-10-03 MED ORDER — OMEPRAZOLE 20 MG PO CPDR: DELAYED_RELEASE_CAPSULE | ORAL | Status: DC

## 2014-10-03 NOTE — Telephone Encounter (Signed)
Pt is aware of this and will make an appt once he is done visiting with his grandkids. Jazmin Hartsell,CMA

## 2014-10-08 ENCOUNTER — Other Ambulatory Visit: Payer: Medicare Other

## 2014-10-08 ENCOUNTER — Other Ambulatory Visit: Payer: Self-pay | Admitting: *Deleted

## 2014-10-08 DIAGNOSIS — E039 Hypothyroidism, unspecified: Secondary | ICD-10-CM

## 2014-10-08 LAB — TSH: TSH: 8.718 u[IU]/mL — ABNORMAL HIGH (ref 0.350–4.500)

## 2014-10-08 MED ORDER — LEVOTHYROXINE SODIUM 50 MCG PO TABS: 50.0000 ug | ORAL_TABLET | Freq: Every day | ORAL | Status: DC

## 2014-10-08 NOTE — Progress Notes (Signed)
[facility] phlebotomist drew:  TSH

## 2014-10-08 NOTE — Telephone Encounter (Signed)
Received a refill request for Levothyroxine 50 mcg from [facility].  Medication was approved for refill, but stated print.  Request resent electronically.  Derl Barrow, RN

## 2014-10-08 NOTE — Addendum Note (Signed)
Addended by: Derl Barrow on: 10/08/2014 03:30 PM   Modules accepted: Orders

## 2014-10-09 ENCOUNTER — Other Ambulatory Visit: Payer: Self-pay | Admitting: Family Medicine

## 2014-10-09 ENCOUNTER — Encounter: Payer: Self-pay | Admitting: Family Medicine

## 2014-10-09 MED ORDER — HYDROCODONE-ACETAMINOPHEN 5-325 MG PO TABS: 1.0000 | ORAL_TABLET | Freq: Three times a day (TID) | ORAL | Status: DC | PRN

## 2014-10-09 MED ORDER — LEVOTHYROXINE SODIUM 75 MCG PO TABS: 75.0000 ug | ORAL_TABLET | Freq: Every day | ORAL | Status: DC

## 2014-10-09 NOTE — Progress Notes (Signed)
VIA PHONE: Discussed THS elevated. Will change to 75 mcg Calling in new rx He also needs refill pain meds so I told him I would put that up front for pick up--recheck TSH 3 months. He wants to make appt to see me about some other things so he will do that at his conveneience in next few weeks [name]

## 2014-10-10 ENCOUNTER — Encounter: Payer: Self-pay | Admitting: Family Medicine

## 2014-10-29 ENCOUNTER — Other Ambulatory Visit: Payer: Self-pay | Admitting: *Deleted

## 2014-10-30 MED ORDER — ATORVASTATIN CALCIUM 10 MG PO TABS: 10.0000 mg | ORAL_TABLET | Freq: Every day | ORAL | Status: DC

## 2014-11-07 DIAGNOSIS — R194 Change in bowel habit: Secondary | ICD-10-CM | POA: Diagnosis not present

## 2014-11-07 DIAGNOSIS — R143 Flatulence: Secondary | ICD-10-CM | POA: Diagnosis not present

## 2014-11-07 DIAGNOSIS — Z1211 Encounter for screening for malignant neoplasm of colon: Secondary | ICD-10-CM | POA: Diagnosis not present

## 2014-11-08 ENCOUNTER — Other Ambulatory Visit: Payer: Self-pay | Admitting: *Deleted

## 2014-11-09 MED ORDER — LOSARTAN POTASSIUM-HCTZ 100-12.5 MG PO TABS: 1.0000 | ORAL_TABLET | Freq: Every day | ORAL | Status: DC

## 2015-01-25 ENCOUNTER — Other Ambulatory Visit: Payer: Self-pay | Admitting: Gastroenterology

## 2015-01-25 DIAGNOSIS — K5730 Diverticulosis of large intestine without perforation or abscess without bleeding: Secondary | ICD-10-CM | POA: Diagnosis not present

## 2015-01-25 DIAGNOSIS — D123 Benign neoplasm of transverse colon: Secondary | ICD-10-CM | POA: Diagnosis not present

## 2015-01-25 DIAGNOSIS — Z1211 Encounter for screening for malignant neoplasm of colon: Secondary | ICD-10-CM | POA: Diagnosis not present

## 2015-03-14 ENCOUNTER — Other Ambulatory Visit: Payer: Self-pay | Admitting: *Deleted

## 2015-03-14 ENCOUNTER — Ambulatory Visit (INDEPENDENT_AMBULATORY_CARE_PROVIDER_SITE_OTHER): Payer: Medicare Other | Admitting: Family Medicine

## 2015-03-14 ENCOUNTER — Encounter: Payer: Self-pay | Admitting: Family Medicine

## 2015-03-14 VITALS — BP 149/88 | HR 57 | Temp 97.8°F | Wt 205.0 lb

## 2015-03-14 DIAGNOSIS — R5383 Other fatigue: Secondary | ICD-10-CM | POA: Insufficient documentation

## 2015-03-14 DIAGNOSIS — R5382 Chronic fatigue, unspecified: Secondary | ICD-10-CM | POA: Diagnosis not present

## 2015-03-14 LAB — CBC
HCT: 39.5 % (ref 39.0–52.0)
HEMOGLOBIN: 14.3 g/dL (ref 13.0–17.0)
MCH: 35 pg — ABNORMAL HIGH (ref 26.0–34.0)
MCHC: 36.2 g/dL — ABNORMAL HIGH (ref 30.0–36.0)
MCV: 96.6 fL (ref 78.0–100.0)
MPV: 10.7 fL (ref 8.6–12.4)
Platelets: 126 10*3/uL — ABNORMAL LOW (ref 150–400)
RBC: 4.09 MIL/uL — AB (ref 4.22–5.81)
RDW: 14.3 % (ref 11.5–15.5)
WBC: 6.3 10*3/uL (ref 4.0–10.5)

## 2015-03-14 LAB — BASIC METABOLIC PANEL
BUN: 19 mg/dL (ref 7–25)
CALCIUM: 9.5 mg/dL (ref 8.6–10.3)
CO2: 28 mmol/L (ref 20–31)
Chloride: 101 mmol/L (ref 98–110)
Creat: 1.33 mg/dL — ABNORMAL HIGH (ref 0.70–1.18)
Glucose, Bld: 102 mg/dL — ABNORMAL HIGH (ref 65–99)
Potassium: 4.5 mmol/L (ref 3.5–5.3)
SODIUM: 140 mmol/L (ref 135–146)

## 2015-03-14 LAB — TSH: TSH: 4.215 u[IU]/mL (ref 0.350–4.500)

## 2015-03-14 NOTE — Assessment & Plan Note (Addendum)
Likely multifactorial in setting of insomnia, OSA noncompliant with CPAP, chronic benzo and narcotic use, hypothyroidism, and vascular disease. PHQ-2 negative.   Instructed patient to start wearing CPAP mask again - patient was agreeable to this. Continue levothyroxine. Will check CBC, BMP, and TSH today and treat accordingly. If continues to have issues with insomnia, would consider referral to sleep specialist or possibly start agent such as trazodone or melatonin. Would be cautious to use hypnotic in elderly patient already on chronic benzos. Could consider weaning off chronic narcotics and benzos though will proceed with above management first.   ADDENDUM: Results reviewed. Creatinine very slightly elevated, though no other significant findings. Creatinine near baseline level.  Would plan to recheck creatinine in 1-2 months to make sure level is stable. Letter sent to patient.

## 2015-03-14 NOTE — Telephone Encounter (Signed)
Patient came into clinic today for a same day for fatigue.  He made an appt for his physical on 04-03-15 but will need a refill on his vicodin before this date.  Will forward to MD. Johnney Ou

## 2015-03-14 NOTE — Patient Instructions (Signed)
Thank you for coming to the clinic today. It was nice seeing you.  Your fatigue is probably coming from your poor sleep and thyroid problems. We will check your thyroid levels today and we will check blood tests to look for other causes of fatigue.  It is important that you wear your CPAP machine every night. If this is not working for you, please let us know so that we can make adjustments.  Please follow up with Dr Nori Riis for your physical.  Take Care,  Dr Jerline Pain

## 2015-03-14 NOTE — Progress Notes (Addendum)
    Subjective:  [name] is a 81 y.o. male who presents to the [facility] today for same day appointment with a chief complaint of fatigue.   HPI: Patient reports chronic history of fatigue. States that it started about 9 months ago when the patient was started on levothyroxine (of note patient was started on levothyroxine 17 months ago per our EMR). Patient reports that he has been taking his levothyroxine without any complications. Reports that he has been sleeping most of the day with little energy. Has gained about 25 pounds. Patient endorses alternating diarrhea and constipation and also alternating hot/cold intolerance.   Regarding patient's sleep, he reports that he usually sleeps about 2-4 hours during the night, then will wake up for a few hours, then go back to sleep for a few hours. Patient reports that he takes about 2 naps per day. Each lasting several hours. Patient has used CPAP machine in the past, but states that it did not make him feel any different. Has not used his CPAP machine in the last 6 months. States that his sleep is non-refreshing.   ROS: No nausea or vomiting, otherwise all systems reviewed and are negative   Objective:  Physical Exam: BP 149/88 mmHg  Pulse 57  Temp(Src) 97.8 F (36.6 C) (Oral)  Wt 205 lb (92.987 kg)  Gen: NAD, resting comfortably CV: RRR with no murmurs appreciated Lungs: NWOB, CTAB with no crackles, wheezes, or rhonchi GI: Obese, Normal bowel sounds present. Soft, Nontender, Nondistended. MSK: no edema, cyanosis, or clubbing noted Skin: warm, dry Neuro: grossly normal, moves all extremities Psych: Normal affect and thought content  Assessment/Plan:  Fatigue Likely multifactorial in setting of insomnia, OSA noncompliant with CPAP, chronic benzo and narcotic use, hypothyroidism, and vascular disease. PHQ-2 negative.   Instructed patient to start wearing CPAP mask again - patient was agreeable to this. Continue levothyroxine. Will check  CBC, BMP, and TSH today and treat accordingly. If continues to have issues with insomnia, would consider referral to sleep specialist or possibly start agent such as trazodone or melatonin. Would be cautious to use hypnotic in elderly patient already on chronic benzos. Could consider weaning off chronic narcotics and benzos though will proceed with above management first.   ADDENDUM: Results reviewed. Creatinine very slightly elevated, though no other significant findings. Creatinine near baseline level.  Would plan to recheck creatinine in 1-2 months to make sure level is stable. Letter sent to patient.     Algis Greenhouse. Jerline Pain, [place] Resident PGY-2 03/15/2015 10:01 AM

## 2015-03-15 ENCOUNTER — Encounter: Payer: Self-pay | Admitting: Family Medicine

## 2015-03-23 DIAGNOSIS — Z23 Encounter for immunization: Secondary | ICD-10-CM | POA: Diagnosis not present

## 2015-03-24 ENCOUNTER — Observation Stay (HOSPITAL_BASED_OUTPATIENT_CLINIC_OR_DEPARTMENT_OTHER)
Admission: EM | Admit: 2015-03-24 | Discharge: 2015-03-25 | Disposition: A | Discharge status: Home / Self Care | Payer: Medicare Other | Attending: Family Medicine | Admitting: Family Medicine

## 2015-03-24 ENCOUNTER — Encounter (HOSPITAL_BASED_OUTPATIENT_CLINIC_OR_DEPARTMENT_OTHER): Payer: Self-pay | Admitting: *Deleted

## 2015-03-24 ENCOUNTER — Emergency Department (HOSPITAL_BASED_OUTPATIENT_CLINIC_OR_DEPARTMENT_OTHER): Payer: Medicare Other

## 2015-03-24 DIAGNOSIS — I129 Hypertensive chronic kidney disease with stage 1 through stage 4 chronic kidney disease, or unspecified chronic kidney disease: Secondary | ICD-10-CM | POA: Diagnosis not present

## 2015-03-24 DIAGNOSIS — G4733 Obstructive sleep apnea (adult) (pediatric): Secondary | ICD-10-CM | POA: Diagnosis not present

## 2015-03-24 DIAGNOSIS — D696 Thrombocytopenia, unspecified: Secondary | ICD-10-CM | POA: Insufficient documentation

## 2015-03-24 DIAGNOSIS — R0789 Other chest pain: Principal | ICD-10-CM | POA: Diagnosis present

## 2015-03-24 DIAGNOSIS — R079 Chest pain, unspecified: Secondary | ICD-10-CM | POA: Diagnosis not present

## 2015-03-24 DIAGNOSIS — Z8546 Personal history of malignant neoplasm of prostate: Secondary | ICD-10-CM | POA: Diagnosis not present

## 2015-03-24 DIAGNOSIS — Z6832 Body mass index (BMI) 32.0-32.9, adult: Secondary | ICD-10-CM | POA: Diagnosis not present

## 2015-03-24 DIAGNOSIS — Z87891 Personal history of nicotine dependence: Secondary | ICD-10-CM | POA: Insufficient documentation

## 2015-03-24 DIAGNOSIS — R42 Dizziness and giddiness: Secondary | ICD-10-CM | POA: Diagnosis not present

## 2015-03-24 DIAGNOSIS — N179 Acute kidney failure, unspecified: Secondary | ICD-10-CM | POA: Diagnosis not present

## 2015-03-24 DIAGNOSIS — Z791 Long term (current) use of non-steroidal anti-inflammatories (NSAID): Secondary | ICD-10-CM | POA: Diagnosis not present

## 2015-03-24 DIAGNOSIS — J45909 Unspecified asthma, uncomplicated: Secondary | ICD-10-CM | POA: Insufficient documentation

## 2015-03-24 DIAGNOSIS — I2510 Atherosclerotic heart disease of native coronary artery without angina pectoris: Secondary | ICD-10-CM | POA: Diagnosis not present

## 2015-03-24 DIAGNOSIS — Z7982 Long term (current) use of aspirin: Secondary | ICD-10-CM | POA: Insufficient documentation

## 2015-03-24 DIAGNOSIS — E039 Hypothyroidism, unspecified: Secondary | ICD-10-CM

## 2015-03-24 DIAGNOSIS — D649 Anemia, unspecified: Secondary | ICD-10-CM | POA: Insufficient documentation

## 2015-03-24 DIAGNOSIS — E669 Obesity, unspecified: Secondary | ICD-10-CM | POA: Diagnosis not present

## 2015-03-24 DIAGNOSIS — N183 Chronic kidney disease, stage 3 (moderate): Secondary | ICD-10-CM | POA: Diagnosis not present

## 2015-03-24 DIAGNOSIS — M2550 Pain in unspecified joint: Secondary | ICD-10-CM | POA: Diagnosis not present

## 2015-03-24 DIAGNOSIS — R531 Weakness: Secondary | ICD-10-CM | POA: Diagnosis not present

## 2015-03-24 DIAGNOSIS — Z79891 Long term (current) use of opiate analgesic: Secondary | ICD-10-CM | POA: Diagnosis not present

## 2015-03-24 DIAGNOSIS — Z8673 Personal history of transient ischemic attack (TIA), and cerebral infarction without residual deficits: Secondary | ICD-10-CM | POA: Insufficient documentation

## 2015-03-24 DIAGNOSIS — M791 Myalgia, unspecified site: Secondary | ICD-10-CM | POA: Insufficient documentation

## 2015-03-24 DIAGNOSIS — F419 Anxiety disorder, unspecified: Secondary | ICD-10-CM

## 2015-03-24 DIAGNOSIS — M6281 Muscle weakness (generalized): Secondary | ICD-10-CM

## 2015-03-24 DIAGNOSIS — Z7951 Long term (current) use of inhaled steroids: Secondary | ICD-10-CM | POA: Insufficient documentation

## 2015-03-24 DIAGNOSIS — J439 Emphysema, unspecified: Secondary | ICD-10-CM | POA: Diagnosis not present

## 2015-03-24 DIAGNOSIS — R51 Headache: Secondary | ICD-10-CM | POA: Insufficient documentation

## 2015-03-24 DIAGNOSIS — R140 Abdominal distension (gaseous): Secondary | ICD-10-CM | POA: Insufficient documentation

## 2015-03-24 DIAGNOSIS — E785 Hyperlipidemia, unspecified: Secondary | ICD-10-CM | POA: Diagnosis not present

## 2015-03-24 DIAGNOSIS — R072 Precordial pain: Secondary | ICD-10-CM | POA: Diagnosis not present

## 2015-03-24 DIAGNOSIS — K219 Gastro-esophageal reflux disease without esophagitis: Secondary | ICD-10-CM | POA: Diagnosis not present

## 2015-03-24 LAB — C-REACTIVE PROTEIN: CRP: 4.4 mg/dL — AB (ref <1.0)

## 2015-03-24 LAB — CBC WITH DIFFERENTIAL/PLATELET
BASOS ABS: 0.1 10*3/uL (ref 0.0–0.1)
Basophils Relative: 1 % (ref 0–1)
EOS ABS: 0.5 10*3/uL (ref 0.0–0.7)
Eosinophils Relative: 8 % — ABNORMAL HIGH (ref 0–5)
HCT: 36.5 % — ABNORMAL LOW (ref 39.0–52.0)
Hemoglobin: 12.8 g/dL — ABNORMAL LOW (ref 13.0–17.0)
LYMPHS ABS: 0.2 10*3/uL — AB (ref 0.7–4.0)
Lymphocytes Relative: 3 % — ABNORMAL LOW (ref 12–46)
MCH: 34.9 pg — ABNORMAL HIGH (ref 26.0–34.0)
MCHC: 35.1 g/dL (ref 30.0–36.0)
MCV: 99.5 fL (ref 78.0–100.0)
MONO ABS: 0.5 10*3/uL (ref 0.1–1.0)
MONOS PCT: 8 % (ref 3–12)
NEUTROS ABS: 5.3 10*3/uL (ref 1.7–7.7)
Neutrophils Relative %: 80 % — ABNORMAL HIGH (ref 43–77)
PLATELETS: 100 10*3/uL — AB (ref 150–400)
RBC: 3.67 MIL/uL — AB (ref 4.22–5.81)
RDW: 13.2 % (ref 11.5–15.5)
Smear Review: DECREASED
WBC: 6.6 10*3/uL (ref 4.0–10.5)

## 2015-03-24 LAB — IRON AND TIBC
IRON: 39 ug/dL — AB (ref 45–182)
SATURATION RATIOS: 14 % — AB (ref 17.9–39.5)
TIBC: 272 ug/dL (ref 250–450)
UIBC: 233 ug/dL

## 2015-03-24 LAB — FERRITIN: FERRITIN: 151 ng/mL (ref 24–336)

## 2015-03-24 LAB — LIPID PANEL
Cholesterol: 108 mg/dL (ref 0–200)
HDL: 33 mg/dL — AB (ref >40)
LDL Cholesterol: 51 mg/dL (ref 0–99)
Total CHOL/HDL Ratio: 3.3 RATIO
Triglycerides: 119 mg/dL (ref <150)
VLDL: 24 mg/dL (ref 0–40)

## 2015-03-24 LAB — URINALYSIS, ROUTINE W REFLEX MICROSCOPIC
BILIRUBIN URINE: NEGATIVE
GLUCOSE, UA: NEGATIVE mg/dL
KETONES UR: NEGATIVE mg/dL
Leukocytes, UA: NEGATIVE
Nitrite: NEGATIVE
PH: 5 (ref 5.0–8.0)
Protein, ur: NEGATIVE mg/dL
Specific Gravity, Urine: 1.013 (ref 1.005–1.030)
Urobilinogen, UA: 0.2 mg/dL (ref 0.0–1.0)

## 2015-03-24 LAB — TROPONIN I

## 2015-03-24 LAB — URINE MICROSCOPIC-ADD ON

## 2015-03-24 LAB — BASIC METABOLIC PANEL
Anion gap: 9 (ref 5–15)
BUN: 23 mg/dL — AB (ref 6–20)
CALCIUM: 9.1 mg/dL (ref 8.9–10.3)
CO2: 26 mmol/L (ref 22–32)
CREATININE: 1.43 mg/dL — AB (ref 0.61–1.24)
Chloride: 102 mmol/L (ref 101–111)
GFR calc Af Amer: 52 mL/min — ABNORMAL LOW (ref >60)
GFR, EST NON AFRICAN AMERICAN: 45 mL/min — AB (ref >60)
Glucose, Bld: 137 mg/dL — ABNORMAL HIGH (ref 65–99)
Potassium: 4.5 mmol/L (ref 3.5–5.1)
SODIUM: 137 mmol/L (ref 135–145)

## 2015-03-24 LAB — RETICULOCYTES
RBC.: 3.81 MIL/uL — ABNORMAL LOW (ref 4.22–5.81)
RETIC CT PCT: 2.6 % (ref 0.4–3.1)
Retic Count, Absolute: 99.1 10*3/uL (ref 19.0–186.0)

## 2015-03-24 LAB — CK: Total CK: 79 U/L (ref 49–397)

## 2015-03-24 LAB — VITAMIN B12: Vitamin B-12: 5380 pg/mL — ABNORMAL HIGH (ref 180–914)

## 2015-03-24 LAB — SEDIMENTATION RATE: SED RATE: 20 mm/h — AB (ref 0–16)

## 2015-03-24 LAB — FOLATE: Folate: 17.2 ng/mL (ref >5.9)

## 2015-03-24 MED ORDER — AMLODIPINE BESYLATE 2.5 MG PO TABS
2.5000 mg | ORAL_TABLET | Freq: Every day | ORAL | Status: DC
  Administered 2015-03-24 – 2015-03-25 (×2): 2.5 mg via ORAL
  Filled 2015-03-24 (×2): qty 1

## 2015-03-24 MED ORDER — MORPHINE SULFATE (PF) 2 MG/ML IV SOLN
1.0000 mg | INTRAVENOUS | Status: DC | PRN
  Administered 2015-03-24: 1 mg via INTRAVENOUS
  Filled 2015-03-24: qty 1

## 2015-03-24 MED ORDER — ALBUTEROL SULFATE HFA 108 (90 BASE) MCG/ACT IN AERS: 2.0000 | INHALATION_SPRAY | RESPIRATORY_TRACT | Status: DC | PRN

## 2015-03-24 MED ORDER — ALBUTEROL SULFATE (2.5 MG/3ML) 0.083% IN NEBU: 2.5000 mg | INHALATION_SOLUTION | Freq: Four times a day (QID) | RESPIRATORY_TRACT | Status: DC | PRN

## 2015-03-24 MED ORDER — NITROGLYCERIN 0.4 MG SL SUBL: 0.4000 mg | SUBLINGUAL_TABLET | SUBLINGUAL | Status: DC | PRN

## 2015-03-24 MED ORDER — CLONAZEPAM 0.5 MG PO TABS: 0.5000 mg | ORAL_TABLET | Freq: Every day | ORAL | Status: DC

## 2015-03-24 MED ORDER — PANTOPRAZOLE SODIUM 40 MG PO TBEC
40.0000 mg | DELAYED_RELEASE_TABLET | Freq: Every day | ORAL | Status: DC
  Administered 2015-03-25: 40 mg via ORAL
  Filled 2015-03-24 (×2): qty 1

## 2015-03-24 MED ORDER — ACETAMINOPHEN 325 MG PO TABS: 650.0000 mg | ORAL_TABLET | ORAL | Status: DC | PRN

## 2015-03-24 MED ORDER — VITAMIN B-12 100 MCG PO TABS
100.0000 ug | ORAL_TABLET | Freq: Every day | ORAL | Status: DC
  Administered 2015-03-24 – 2015-03-25 (×2): 100 ug via ORAL
  Filled 2015-03-24 (×2): qty 1

## 2015-03-24 MED ORDER — ASPIRIN EC 325 MG PO TBEC
325.0000 mg | DELAYED_RELEASE_TABLET | Freq: Every day | ORAL | Status: DC
  Administered 2015-03-24: 325 mg via ORAL
  Filled 2015-03-24 (×2): qty 1

## 2015-03-24 MED ORDER — NITROGLYCERIN 0.4 MG/SPRAY TL SOLN
1.0000 | Status: DC | PRN
  Filled 2015-03-24: qty 4.9

## 2015-03-24 MED ORDER — LOSARTAN POTASSIUM 50 MG PO TABS: 100.0000 mg | ORAL_TABLET | Freq: Every day | ORAL | Status: DC

## 2015-03-24 MED ORDER — GI COCKTAIL ~~LOC~~
30.0000 mL | Freq: Once | ORAL | Status: AC
  Administered 2015-03-24: 30 mL via ORAL
  Filled 2015-03-24: qty 30

## 2015-03-24 MED ORDER — HYDROCHLOROTHIAZIDE 12.5 MG PO CAPS
12.5000 mg | ORAL_CAPSULE | Freq: Every day | ORAL | Status: DC
  Administered 2015-03-24 – 2015-03-25 (×2): 12.5 mg via ORAL
  Filled 2015-03-24 (×2): qty 1

## 2015-03-24 MED ORDER — LOSARTAN POTASSIUM-HCTZ 100-12.5 MG PO TABS: 1.0000 | ORAL_TABLET | Freq: Every day | ORAL | Status: DC

## 2015-03-24 MED ORDER — LEVOTHYROXINE SODIUM 75 MCG PO TABS
75.0000 ug | ORAL_TABLET | Freq: Every day | ORAL | Status: DC
  Administered 2015-03-24: 75 ug via ORAL
  Filled 2015-03-24 (×2): qty 1

## 2015-03-24 MED ORDER — VITAMIN D 1000 UNITS PO TABS
1000.0000 [IU] | ORAL_TABLET | Freq: Every day | ORAL | Status: DC
  Administered 2015-03-24 – 2015-03-25 (×2): 1000 [IU] via ORAL
  Filled 2015-03-24 (×2): qty 1

## 2015-03-24 MED ORDER — ATORVASTATIN CALCIUM 10 MG PO TABS
10.0000 mg | ORAL_TABLET | Freq: Every day | ORAL | Status: DC
Start: 2015-03-24 — End: 2015-03-25
  Administered 2015-03-24: 10 mg via ORAL
  Filled 2015-03-24: qty 1

## 2015-03-24 MED ORDER — SODIUM CHLORIDE 0.9 % IV BOLUS (SEPSIS)
500.0000 mL | Freq: Once | INTRAVENOUS | Status: AC
  Administered 2015-03-24: 500 mL via INTRAVENOUS

## 2015-03-24 MED ORDER — KETOROLAC TROMETHAMINE 30 MG/ML IJ SOLN
30.0000 mg | Freq: Once | INTRAMUSCULAR | Status: AC
  Administered 2015-03-24: 30 mg via INTRAVENOUS
  Filled 2015-03-24: qty 1

## 2015-03-24 MED ORDER — ONDANSETRON HCL 4 MG/2ML IJ SOLN: 4.0000 mg | Freq: Four times a day (QID) | INTRAMUSCULAR | Status: DC | PRN

## 2015-03-24 MED ORDER — HEPARIN SODIUM (PORCINE) 5000 UNIT/ML IJ SOLN
5000.0000 [IU] | Freq: Three times a day (TID) | INTRAMUSCULAR | Status: DC
  Administered 2015-03-24 – 2015-03-25 (×3): 5000 [IU] via SUBCUTANEOUS
  Filled 2015-03-24 (×3): qty 1

## 2015-03-24 MED ORDER — GI COCKTAIL ~~LOC~~: 30.0000 mL | Freq: Four times a day (QID) | ORAL | Status: DC | PRN

## 2015-03-24 MED ORDER — CARVEDILOL 3.125 MG PO TABS
3.1250 mg | ORAL_TABLET | Freq: Two times a day (BID) | ORAL | Status: DC
  Administered 2015-03-24 – 2015-03-25 (×2): 3.125 mg via ORAL
  Filled 2015-03-24 (×2): qty 1

## 2015-03-24 NOTE — ED Notes (Signed)
Pt resting at this time, appears comfortable, awaiting transport to admitting facility

## 2015-03-24 NOTE — ED Notes (Addendum)
Pt. States that he had a flu shot yesterday around 3 pm yesterday. States around he was walking and visiting with his neighbor and had to walk up an inclined driveway. States after that he felt general weakness, general back pain, and felt dizzy. States he took a vicodin without relief. States he laid down and started shaking all over that lasted for about 20 minutes. States he has never had any shaking like this before. States this morning he has started have chest pain so he took 3 ASA. States he does have CAD but has not had an MI or stent placement. Denies any sob. States he has felt a little nauseated. Took a nitro spray pta with little relief.

## 2015-03-24 NOTE — ED Notes (Signed)
Dr. Randal Buba at [facility], pt/family updated on results and admission plan

## 2015-03-24 NOTE — ED Notes (Signed)
Attempted report 

## 2015-03-24 NOTE — Progress Notes (Signed)
INTERVAL NOTE  Patient went to ED with diffuse myalgias, arthralgias, and weakness. Also endorsed chest discomfort which lead to transfer to [facility]. No chest pain currently. No myalgias and arthralgias currently- states he has them intermittently, normally 22 hours per day. Patient also notes a 25lb weight gain since starting levothyroxine. Looking through EMR, 1 year ago he was 202 lbs, approximately 18 months ago he ranged from 195-202lbs. Endorses problems with temperature stability- sometimes very cold, sometimes very hot. No striae. Does endorse intermittent abdominal distension that resolves once he has a large BM- normally there is hard stool initially, then "an explosion" after that "like a cork has been removed." Took Align in the past per GI without improvement therefore was instructed to discontinue it. Takes MiraLax daily. Is prescribed colace but doesn't take it regularly. Has a meeting with his attorney on Tuesday at [place] and would like to be able to make it.   General: Lying in bed in NAD. Non-toxic. Very talkative and pleasant.  Eyes: Conjunctivae non-injected.  ENTM: Moist mucous membranes. Oropharynx clear. No nasal discharge.  Neck: Supple, no LAD Cardiovascular: RRR. No murmurs, rubs, or gallops noted. No pitting edema noted. Respiratory: No increased WOB. CTAB without wheezing, rhonchi, or crackles noted. Abdomen: +BS, soft, non-distended, non-tender. No striae.  MSK: Normal bulk and tone noted. No gross deformities noted.  Skin: No rashes noted  Neuro: A&O x4. Mildly decreased grip strength. 4+/5 strength in the hip flexors bilaterally, 5/5 strength otherwise.  Psych:  Appropriate mood and affect.    Chest pain: Resolved.  Very unlikely to be  ACS. Troponins negative x 3. Initial EKG unchanged. No active pulmonary disease noted on CXR (stable emphysematous and chronic bronchitic changes).  - f/u AM EKG  Intermittent dffuse myalgias and arthralgias with weakness: None  currently. No evidence of joint effusions on exams in the hands or knees. When evaluating his outpatient weights, it does not appear he has had a 25lb weight gain. Patient is on a statin which could be contributing however CK is not elevated. DDx includes rheumatic/autoimmune disorders. Unable to determine if this initially started in the proximal muscles (more c/w polymyositis) vs distal muscles (more consistent with inclusion body myositis). Recent TSH within normal limits. - continue vitamin D - f/u ANA, ANCA, ACTH stim test, AM cortisol, vitamin D level.  - PT/OT - f/u on myalgias and arthralgias.  Archie Patten, MD [facility] Resident  03/25/2015, 7:11 AM

## 2015-03-24 NOTE — H&P (Signed)
[facility] Admission History and Physical Service Pager: [phone]  Patient name: [name] Medical record number: [id number] Date of birth: 01/01/1934 Age: 81 y.o. Gender: male  Primary Care Provider: Dorcas Mcmurray, MD Consultants: None Code Status: Full  Chief Complaint: chest pain  Assessment and Plan: [name] is a 81 y.o. male presenting with chest pain, diffuse myalgias and arthralgias. PMH is significant for prostate cancer, CAD, GERD, venous insufficiency, hiatal hernia.  Chest pain: History of CAD. Last cath 09/16/2014 with moderate proximal RCA stenosis. Being treated with medical management. Initial troponin negative. EKG with no acute ischemic changes. CXR with emphysematous and chronic bronchitic changes, no active pulmonary disease. Wells score 0 and Geneva score 3 (heart rate 75-94) making him low risk for PE. His TIMI score is 24 (age, >3 CAD risk factors, known CAD, ASA use). HEART score is 4, moderate risk. -repeat EKG in AM -trend troponins -lipid panel -hemoglobin A1c  -morphine 13m Q4hr prn pain -nitrostat SL 0.458mQ5m51mprn chest pain -ASA 325m65mily -continue home atorvastatin 10mg71mly -GI cocktail QID prn -Protonix 40mg 46my -tele  Headache: CT head with no acute changes. No neurological deficits. Likely a tension headache.  -tylenol 650mg q42mprn  -continue to monitor  Diffuse myalgias and arthralgias with weakness: Ongoing for past 2 years. Has previously been attributed to arthritis. Differential includes rheumatic/autoimmune diseases such as lupus, RA, PMR, polymyositis/dermatomyositits, and vasculitis. Also can consider adrenal insufficiency, hypothyroidism (has history but unlikely as most recent TSH on 9/1 normal), fibromyalgia, and viral infections. Will do full work up.  -Continue home vitamin D 1000 units daily. -PT/OT -ACTH stim, AM cortisol -ANCA, ANA -ESR, CRP -Consider starting steroids empirically  tomorrow -Vitamin D level  AKI on CKD stage 3: Cr 1.43 on admission, BL Cr ~1.1-1.2. BUN slightly up so likely prerenal. He received 500ml bo90mNS in ED. Will start diet and monitor Cr.  -Hold ARB for now -Continue to monitor -bmet in AM  Normocytic anemia: Hgb 12.8. Previously 14.3 on 03/14/2015. No prior anemia panel. Patient denies any acute blood loss and melena or hematochezia.  -Anemia panel including folate, vitamin b12, iron, TIBC, ferritin, reticulocytes -FOBT -Continue to monitor  Thrombocytopenia: Platelets 100 on admission, previously 126 on 03/14/2015. Baseline platelet level over past year has been in 120-140 range. No history of liver disease or alcohol abuse. Could be related to suspected underlying autoimmune condition.  - Continue to monitor - CBC in AM  Hypertension: On amlodipine 2.5mg dail69mcarvedilol 3.125 mg twice a day, HCTZ 12.5mg daily54mosartan 100mg daily89mhome - Hold losartan 100mg daily 56mnow given AKI - Continue other home meds  Hypothyroidism: Last TSH 4.215 on 03/14/2015 -Continue home levothyroxine 75mcg daily.75mxiety:  - Continue home clonazepam 0.5mg QHS.  FEN40m: Heart healthy Prophylaxis: Subq hep  Disposition: pending further workup  History of Present Illness:  [name] iMELCHIZEDEK King wi82 PMHx of HTN, prostate cancer, CAD, GERD, venous insufficiency, OSA on CPAP, and hiatal hernia presenting with chest pain, arthralgias, and myalgias. He reports that the chest pain started yesterday while at rest. It is sharp in nature and occurs across the bottom of his rib cage. No radiation. No exacerbating or worsening factors. No shortness of breath or nausea. He reports that his diffuse myalgias and arthralgias that he has had for 2 years are worse. The pain is worst in his spine and rib cage.   He was  seen in clinic on 03/14/2015 for fatigue, myalgias, arthralgias.   He has a new headache that started today. He reports subjective fever,  heat/cold intolerance, lower extremity weakness with difficulty standing up from sitting. He has a dry cough that started two days ago. He denies weight loss, vision changes, jaw claudication, nausea, vomiting, dysuria, hematuria, trouble raising his arms  Review Of Systems: All pertinent positives in HPI.  Otherwise 12 point review of systems was performed and was unremarkable.  Patient Active Problem List   Diagnosis Date Noted  . Chest pain at rest 03/24/2015  . Fatigue 03/14/2015  . Hypothyroidism 10/26/2013  . OSA (obstructive sleep apnea) 10/18/2013  . Well adult health check 01/02/2013  . Other symptoms involving urinary system(788.99) 12/21/2012  . Benign localized hyperplasia of prostate with urinary obstruction and other lower urinary tract symptoms (LUTS)(600.21) 12/21/2012  . Carcinoma in situ of prostate 12/21/2012  . Left shoulder pain 08/08/2012  . Obesity 07/24/2011  . Generalized anxiety disorder 07/24/2011  . DIVERTICULOSIS OF COLON 12/25/2009  . DERMATITIS, SEBORRHEIC 12/25/2009  . 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 Memory loss 08/14/2009  . BACK PAIN, LUMBAR 07/19/2009  . PROSTATE CANCER 09/09/2006  . HYPERLIPIDEMIA 09/09/2006  . HYPERTENSION, BENIGN SYSTEMIC 09/09/2006  . CORONARY, ARTERIOSCLEROSIS 09/09/2006  . VENOUS INSUFFICIENCY, CHRONIC 09/09/2006  . GASTROESOPHAGEAL REFLUX, NO ESOPHAGITIS 09/09/2006  . HERNIA, HIATAL, NONCONGENITAL 09/09/2006  . INSOMNIA NOS 09/09/2006   Past Medical History: Past Medical History  Diagnosis Date  . Diverticulitis     recurrent  . Tinnitus   . Hearing aid worn   . Kidney stone     lithotripsy 2458 w complications, req stents, Dr Rosana Hoes  . Prostate ca   . Complication of anesthesia     " HAD TREMERS AFTER PROSTATE SURGERY"  . Family history of anesthesia complication     MOTHER   . Coronary artery disease   . Anginal pain   . Hypertension   . Hyperlipemia   . Anxiety     " OCCASIONAL"  . Asthma due to  seasonal allergies     uses inhalers prn  . Shortness of breath   . TIA (transient ischemic attack)   . GERD (gastroesophageal reflux disease)   . H/O hiatal hernia   . Arthritis    Past Surgical History: Past Surgical History  Procedure Laterality Date  . Carotid endarterectomy  RIGHT    1998  . Prostatectomy  1998    radical for prostate cancer  . Cardiac catheterization  2008    minimal dz, Dr Cathie Olden  . Cholecystectomy    . Left heart catheterization with coronary angiogram N/A 09/15/2013    Procedure: LEFT HEART CATHETERIZATION WITH CORONARY ANGIOGRAM;  Surgeon: Peter M Martinique, MD;  Location: [facility] C[facility] LAB;  Service: Cardiovascular;  Laterality: N/A;   Social History: Social History  Substance Use Topics  . Smoking status: Former Smoker -- 0.50 packs/day    Types: Cigarettes    Quit date: 08/28/1996  . Smokeless tobacco: Never Used  . Alcohol Use: 1.0 oz/week    2 drink(s) per week     Comment: OCC   Please also refer to relevant sections of EMR.  Family History: History reviewed. No pertinent family history. Allergies and Medications: Allergies  Allergen Reactions  . Ciprofloxacin     Other reaction(s): Unknown per [facility] records  . Codeine     Other reaction(s): UnknownUnknown per [facility] records  . Sulfonamide Derivatives  Other (See Comments)    Chills and shaking     No current facility-administered medications on file prior to encounter.   Current Outpatient Prescriptions on File Prior to Encounter  Medication Sig Dispense Refill  . albuterol (PROVENTIL HFA) 108 (90 BASE) MCG/ACT inhaler Inhale 2 puffs into the lungs every 4 (four) hours as needed for wheezing. 18 g 0  . amLODipine (NORVASC) 2.5 MG tablet Take 1 tablet (2.5 mg total) by mouth daily. 90 tablet 3  . Artificial Tear Solution (BION TEARS OP) Place 1 drop into both eyes daily as needed (dry eyes).    Marland Kitchen aspirin (BAYER ASPIRIN) 325 MG tablet Take 325 mg by mouth daily. Pt  stated he alternates the 325 MG with the (2) two 81 MG tablets    . atorvastatin (LIPITOR) 10 MG tablet Take 1 tablet (10 mg total) by mouth daily. 90 tablet 3  . benzonatate (TESSALON) 100 MG capsule Take 1 capsule (100 mg total) by mouth 3 (three) times daily as needed for cough. 20 capsule 0  . carvedilol (COREG) 3.125 MG tablet Take 1 tablet (3.125 mg total) by mouth 2 (two) times daily with a meal. 180 tablet 3  . cetirizine (ZYRTEC) 10 MG tablet Take 10 mg by mouth daily. As needed for allergies     . cholecalciferol (VITAMIN D) 1000 UNITS tablet Take 1,000 Units by mouth daily.    . clonazePAM (KLONOPIN) 0.5 MG tablet Take 1 tablet (0.5 mg total) by mouth at bedtime. 30 tablet 0  . Cyanocobalamin (VITAMIN B-12 PO) Take 1 tablet by mouth daily.    Marland Kitchen econazole nitrate 1 % cream Apply topically daily. 15 g 5  . EQ IBUPROFEN PO Take 1-2 tablets by mouth every 6 (six) hours as needed (pain).    . fluticasone (FLONASE) 50 MCG/ACT nasal spray Place 2 sprays into both nostrils 2 (two) times daily. 16 g 6  . HYDROcodone-acetaminophen (NORCO/VICODIN) 5-325 MG per tablet Take 1 tablet by mouth every 8 (eight) hours as needed. 120 tablet 0  . levothyroxine (SYNTHROID, LEVOTHROID) 75 MCG tablet Take 1 tablet (75 mcg total) by mouth daily. 90 tablet 3  . losartan-hydrochlorothiazide (HYZAAR) 100-12.5 MG per tablet Take 1 tablet by mouth daily. 90 tablet 3  . mometasone (ELOCON) 0.1 % cream Apply 1 application topically daily. 45 g 5  . nitroGLYCERIN (NITROLINGUAL) 0.4 MG/SPRAY spray Place 1 spray under the tongue every 5 (five) minutes x 3 doses as needed for chest pain. 12 g 12  . omeprazole (PRILOSEC) 20 MG capsule Take 2 capsules (40 mg total) by mouth daily. For 1-2 weeks, then resume previous dosing. 45 capsule 0  . omeprazole (PRILOSEC) 20 MG capsule TAKE 1 CAPSULE (20 MG TOTAL) BY MOUTH DAILY. 90 capsule 3  . POLYETHYLENE GLYCOL 3350 PO Take 17 g by mouth daily as needed (mild constipation).    .  triamcinolone (KENALOG) 0.1 % lotion Apply 1 application topically daily. After shower      Objective: BP 172/65 mmHg  Pulse 77  Temp(Src) 97.8 F (36.6 C) (Oral)  Resp 20  Ht 5' 7"  (1.702 m)  Wt 207 lb 4.8 oz (94.031 kg)  BMI 32.46 kg/m2  SpO2 97% Exam: General: NAD, occasional brief episodes of what appears to be whole body spasms Eyes: PEERL, EOMI, anicteric sclera ENTM: Normal external ear canal, mucosa normal Neck: Supple Cardiovascular: Regular rate and rhythm, no murmur/rub/gallop Respiratory: Clear to auscultation bilaterally. No wheezes, rhonchi or rales. Breathing comfortably Abdomen: Soft,  nontender, nondistended, no rebound/guarding MSK: Normal, atraumatic, warm and well perfused, no edema Skin: No rashes or lesions Neuro: Alert and oriented x 3. CNII-XII intact. Mildly decreased grip strength and proximal LE, but normal strength and sensation otherwise. Psych: Mood and affect normal  Labs and Imaging: CBC BMET   Recent Labs Lab 03/24/15 0330  WBC 6.6  HGB 12.8*  HCT 36.5*  PLT 100*    Recent Labs Lab 03/24/15 0330  NA 137  K 4.5  CL 102  CO2 26  BUN 23*  CREATININE 1.43*  GLUCOSE 137*  CALCIUM 9.1     CK 79 Troponin neg UA- neg Retics 2.6%  Milagros Loll, MD 03/24/2015, 11:13 AM PGY-II, IMTS FPTS Intern pager: [phone], text pages welcome

## 2015-03-24 NOTE — ED Provider Notes (Signed)
CSN: [id number]     Arrival date & time 03/24/15  0230 History   First MD Initiated Contact with Patient 03/24/15 [phone]     Chief Complaint  Patient presents with  . Chest Pain     (Consider location/radiation/quality/duration/timing/severity/associated sxs/prior Treatment) Patient is a 81 y.o. male presenting with chest pain. The history is provided by the patient.  Chest Pain Pain location:  Substernal area Pain quality: dull   Pain radiates to:  Does not radiate Pain radiates to the back: no   Pain severity:  Mild Onset quality:  Sudden Duration:  2 hours Timing:  Constant Progression:  Resolved Chronicity:  Recurrent Context: at rest   Relieved by:  Nothing Worsened by:  Nothing tried Ineffective treatments:  Aspirin and nitroglycerin Associated symptoms: dizziness and fatigue   Associated symptoms: no fever, no numbness, no palpitations, no shortness of breath, no syncope, not vomiting and no weakness   Associated symptoms comment:  Fatigue tremulous--- this is new in 24 hours.  Also  muscle and joint pain this has been going on for 2 years Risk factors: male sex   Risk factors: no smoking   Took extra coreg because BP was up and pulse was 80 and his pulse is normally 50.  Also took a tums and a vicodin.  Had the same fatigue and leg heaviness several days ago and was seen in PMDs office for same.  Had blood work done at that time  Past Medical History  Diagnosis Date  . Diverticulitis     recurrent  . Tinnitus   . Hearing aid worn   . Kidney stone     lithotripsy [id number] w complications, req stents, Dr Rosana Hoes  . Prostate ca   . Complication of anesthesia     " HAD TREMERS AFTER PROSTATE SURGERY"  . Family history of anesthesia complication     MOTHER   . Coronary artery disease   . Anginal pain   . Hypertension   . Hyperlipemia   . Anxiety     " OCCASIONAL"  . Asthma due to seasonal allergies     uses inhalers prn  . Shortness of breath   . TIA (transient  ischemic attack)   . GERD (gastroesophageal reflux disease)   . H/O hiatal hernia   . Arthritis    Past Surgical History  Procedure Laterality Date  . Carotid endarterectomy  RIGHT    1998  . Prostatectomy  1998    radical for prostate cancer  . Cardiac catheterization  2008    minimal dz, Dr Cathie Olden  . Cholecystectomy    . Left heart catheterization with coronary angiogram N/A 09/15/2013    Procedure: LEFT HEART CATHETERIZATION WITH CORONARY ANGIOGRAM;  Surgeon: Peter M Martinique, MD;  Location[facility] C[facility] LAB;  Service: Cardiovascular;  Laterality: N/A;   No family history on file. Social History  Substance Use Topics  . Smoking status: Former Smoker -- 0.50 packs/day    Types: Cigarettes    Quit date: 08/28/1996  . Smokeless tobacco: Never Used  . Alcohol Use: 1.0 oz/week    2 drink(s) per week     Comment: OCC    Review of Systems  Constitutional: Positive for fatigue. Negative for fever.  Respiratory: Negative for shortness of breath.   Cardiovascular: Positive for chest pain. Negative for palpitations, leg swelling and syncope.  Gastrointestinal: Negative for vomiting.  Musculoskeletal: Positive for myalgias and arthralgias.  Neurological: Positive for dizziness and tremors. Negative for syncope, facial  asymmetry, speech difficulty, weakness and numbness.  All other systems reviewed and are negative.     Allergies  Ciprofloxacin; Codeine; and Sulfonamide derivatives  Home Medications   Prior to Admission medications   Medication Sig Start Date End Date Taking? Authorizing Provider  albuterol (PROVENTIL HFA) 108 (90 BASE) MCG/ACT inhaler Inhale 2 puffs into the lungs every 4 (four) hours as needed for wheezing. 11/13/13   Leeanne Rio, MD  amLODipine (NORVASC) 2.5 MG tablet Take 1 tablet (2.5 mg total) by mouth daily. 01/03/14   Dickie La, MD  Artificial Tear Solution (BION TEARS OP) Place 1 drop into both eyes daily as needed (dry eyes).    Historical Provider, MD   aspirin (BAYER ASPIRIN) 325 MG tablet Take 325 mg by mouth daily. Pt stated he alternates the 325 MG with the (2) two 81 MG tablets    Historical Provider, MD  atorvastatin (LIPITOR) 10 MG tablet Take 1 tablet (10 mg total) by mouth daily. 10/30/14   Dickie La, MD  benzonatate (TESSALON) 100 MG capsule Take 1 capsule (100 mg total) by mouth 3 (three) times daily as needed for cough. 11/13/13   Leeanne Rio, MD  carvedilol (COREG) 3.125 MG tablet Take 1 tablet (3.125 mg total) by mouth 2 (two) times daily with a meal. 12/07/13   Dickie La, MD  cetirizine (ZYRTEC) 10 MG tablet Take 10 mg by mouth daily. As needed for allergies     Historical Provider, MD  cholecalciferol (VITAMIN D) 1000 UNITS tablet Take 1,000 Units by mouth daily.    Historical Provider, MD  clonazePAM (KLONOPIN) 0.5 MG tablet Take 1 tablet (0.5 mg total) by mouth at bedtime. 02/07/14   Dickie La, MD  Cyanocobalamin (VITAMIN B-12 PO) Take 1 tablet by mouth daily.    Historical Provider, MD  econazole nitrate 1 % cream Apply topically daily. 01/03/14   Dickie La, MD  EQ IBUPROFEN PO Take 1-2 tablets by mouth every 6 (six) hours as needed (pain).    Historical Provider, MD  fluticasone (FLONASE) 50 MCG/ACT nasal spray Place 2 sprays into both nostrils 2 (two) times daily. 11/22/13   Dickie La, MD  HYDROcodone-acetaminophen (NORCO/VICODIN) 5-325 MG per tablet Take 1 tablet by mouth every 8 (eight) hours as needed. 10/09/14   Dickie La, MD  levothyroxine (SYNTHROID, LEVOTHROID) 75 MCG tablet Take 1 tablet (75 mcg total) by mouth daily. 10/09/14   Dickie La, MD  losartan-hydrochlorothiazide (HYZAAR) 100-12.5 MG per tablet Take 1 tablet by mouth daily. 11/09/14   Dickie La, MD  mometasone (ELOCON) 0.1 % cream Apply 1 application topically daily. 01/03/14   Dickie La, MD  nitroGLYCERIN (NITROLINGUAL) 0.4 MG/SPRAY spray Place 1 spray under the tongue every 5 (five) minutes x 3 doses as needed for chest pain. 10/03/14   Dickie La, MD  omeprazole (PRILOSEC) 20 MG capsule Take 2 capsules (40 mg total) by mouth daily. For 1-2 weeks, then resume previous dosing. 09/16/13   Hilton Sinclair, MD  omeprazole (PRILOSEC) 20 MG capsule TAKE 1 CAPSULE (20 MG TOTAL) BY MOUTH DAILY. 10/03/14   Dickie La, MD  POLYETHYLENE GLYCOL 3350 PO Take 17 g by mouth daily as needed (mild constipation).    Historical Provider, MD  triamcinolone (KENALOG) 0.1 % lotion Apply 1 application topically daily. After shower    Historical Provider, MD   BP 146/72 mmHg  Pulse 89  Temp(Src) 99.5 F (37.5  C) (Oral)  Resp 20  SpO2 96% Physical Exam  Constitutional: He is oriented to person, place, and time. He appears well-developed and well-nourished. No distress.  HENT:  Head: Normocephalic and atraumatic.  Mouth/Throat: Oropharynx is clear and moist.  Eyes: Conjunctivae and EOM are normal. Pupils are equal, round, and reactive to light.  Neck: Normal range of motion. Neck supple.  Cardiovascular: Normal rate, regular rhythm and intact distal pulses.   Pulmonary/Chest: Effort normal and breath sounds normal. No respiratory distress. He has no wheezes. He has no rales.  Abdominal: Soft. Bowel sounds are normal. He exhibits no distension. There is no tenderness. There is no rebound and no guarding.  Musculoskeletal: Normal range of motion. He exhibits no edema or tenderness.  Neurological: He is alert and oriented to person, place, and time. He has normal reflexes. He displays normal reflexes. No cranial nerve deficit.  Skin: Skin is warm and dry.  Psychiatric: He has a normal mood and affect.    ED Course  Procedures (including critical care time) Labs Review Labs Reviewed  CBC WITH DIFFERENTIAL/PLATELET - Abnormal; Notable for the following:    RBC 3.67 (*)    Hemoglobin 12.8 (*)    HCT 36.5 (*)    MCH 34.9 (*)    Platelets 100 (*)    Neutrophils Relative % 80 (*)    Lymphocytes Relative 3 (*)    Eosinophils Relative 8 (*)     Lymphs Abs 0.2 (*)    All other components within normal limits  BASIC METABOLIC PANEL - Abnormal; Notable for the following:    Glucose, Bld 137 (*)    BUN 23 (*)    Creatinine, Ser 1.43 (*)    GFR calc non Af Amer 45 (*)    GFR calc Af Amer 52 (*)    All other components within normal limits  TROPONIN I  CK  URINALYSIS, ROUTINE W REFLEX MICROSCOPIC (NOT AT [facility])    Imaging Review Dg Chest 2 View  03/24/2015   CLINICAL DATA:  Flu shot yesterday. Subsequent feeling of general weakness, back pain, and dizziness. Shaking all over. Nausea.  EXAM: CHEST  2 VIEW  COMPARISON:  09/15/2013  FINDINGS: Normal heart size and pulmonary vascularity. Hyperinflation consistent with emphysematous changes in the lungs. Central interstitial pattern consistent with chronic bronchitis. No focal consolidation or airspace disease. Calcified and tortuous aorta. Mediastinal contours appear intact. Degenerative changes in the spine. Surgical clips in the base of the neck.  IMPRESSION: Emphysematous and chronic bronchitic changes in the lungs. No evidence of active pulmonary disease.   Electronically Signed   By: Lucienne Capers M.D.   On: 03/24/2015 04:19   Ct Head Wo Contrast  03/24/2015   CLINICAL DATA:  Flu shot yesterday around 3 p.m. Generalized weakness, back pain, dizziness, and tremors.  EXAM: CT HEAD WITHOUT CONTRAST  TECHNIQUE: Contiguous axial images were obtained from the base of the skull through the vertex without intravenous contrast.  COMPARISON:  None.  FINDINGS: Diffuse cerebral atrophy. Ventricular dilatation consistent with central atrophy. Low-attenuation changes in the deep white matter consistent with small vessel ischemia. No mass effect or midline shift. No abnormal extra-axial fluid collections. Gray-white matter junctions are distinct. Basal cisterns are not effaced. No evidence of acute intracranial hemorrhage. No depressed skull fractures. Visualized paranasal sinuses and mastoid air cells  are not opacified.  IMPRESSION: No acute intracranial abnormalities. Chronic atrophy and small vessel ischemic changes.   Electronically Signed   By: Gwyndolyn Saxon  Gerilyn Nestle M.D.   On: 03/24/2015 05:37   I have personally reviewed and evaluated these images and lab results as part of my medical decision-making.   EKG Interpretation   Date/Time:  Sunday March 24 2015 02:41:52 EDT Ventricular Rate:  80 PR Interval:  140 QRS Duration: 82 QT Interval:  374 QTC Calculation: 431 R Axis:   14 Text Interpretation:  Normal sinus rhythm Low voltage QRS Confirmed by  [facility]  MD, Anaih Brander ([id number]) on 03/24/2015 5:19:49 AM      MDM   Final diagnoses:  None    Results for orders placed or performed during the hospital encounter of 03/24/15  CBC with Differential/Platelet  Result Value Ref Range   WBC 6.6 4.0 - 10.5 K/uL   RBC 3.67 (L) 4.22 - 5.81 MIL/uL   Hemoglobin 12.8 (L) 13.0 - 17.0 g/dL   HCT 36.5 (L) 39.0 - 52.0 %   MCV 99.5 78.0 - 100.0 fL   MCH 34.9 (H) 26.0 - 34.0 pg   MCHC 35.1 30.0 - 36.0 g/dL   RDW 13.2 11.5 - 15.5 %   Platelets 100 (L) 150 - 400 K/uL   Neutrophils Relative % 80 (H) 43 - 77 %   Lymphocytes Relative 3 (L) 12 - 46 %   Monocytes Relative 8 3 - 12 %   Eosinophils Relative 8 (H) 0 - 5 %   Basophils Relative 1 0 - 1 %   Neutro Abs 5.3 1.7 - 7.7 K/uL   Lymphs Abs 0.2 (L) 0.7 - 4.0 K/uL   Monocytes Absolute 0.5 0.1 - 1.0 K/uL   Eosinophils Absolute 0.5 0.0 - 0.7 K/uL   Basophils Absolute 0.1 0.0 - 0.1 K/uL   Smear Review PLATELETS APPEAR DECREASED   Basic metabolic panel  Result Value Ref Range   Sodium 137 135 - 145 mmol/L   Potassium 4.5 3.5 - 5.1 mmol/L   Chloride 102 101 - 111 mmol/L   CO2 26 22 - 32 mmol/L   Glucose, Bld 137 (H) 65 - 99 mg/dL   BUN 23 (H) 6 - 20 mg/dL   Creatinine, Ser 1.43 (H) 0.61 - 1.24 mg/dL   Calcium 9.1 8.9 - 10.3 mg/dL   GFR calc non Af Amer 45 (L) >60 mL/min   GFR calc Af Amer 52 (L) >60 mL/min   Anion gap 9 5 - 15   Troponin I  Result Value Ref Range   Troponin I <0.03 <0.031 ng/mL  Urinalysis, Routine w reflex microscopic (not at [facility])  Result Value Ref Range   Color, Urine YELLOW YELLOW   APPearance CLEAR CLEAR   Specific Gravity, Urine 1.013 1.005 - 1.030   pH 5.0 5.0 - 8.0   Glucose, UA NEGATIVE NEGATIVE mg/dL   Hgb urine dipstick TRACE (A) NEGATIVE   Bilirubin Urine NEGATIVE NEGATIVE   Ketones, ur NEGATIVE NEGATIVE mg/dL   Protein, ur NEGATIVE NEGATIVE mg/dL   Urobilinogen, UA 0.2 0.0 - 1.0 mg/dL   Nitrite NEGATIVE NEGATIVE   Leukocytes, UA NEGATIVE NEGATIVE  CK  Result Value Ref Range   Total CK 79 49 - 397 U/L  Urine microscopic-add on  Result Value Ref Range   Squamous Epithelial / LPF RARE RARE   WBC, UA 0-2 <3 WBC/hpf   RBC / HPF 0-2 <3 RBC/hpf   Dg Chest 2 View  03/24/2015   CLINICAL DATA:  Flu shot yesterday. Subsequent feeling of general weakness, back pain, and dizziness. Shaking all over. Nausea.  EXAM: CHEST  2 VIEW  COMPARISON:  09/15/2013  FINDINGS: Normal heart size and pulmonary vascularity. Hyperinflation consistent with emphysematous changes in the lungs. Central interstitial pattern consistent with chronic bronchitis. No focal consolidation or airspace disease. Calcified and tortuous aorta. Mediastinal contours appear intact. Degenerative changes in the spine. Surgical clips in the base of the neck.  IMPRESSION: Emphysematous and chronic bronchitic changes in the lungs. No evidence of active pulmonary disease.   Electronically Signed   By: Lucienne Capers M.D.   On: 03/24/2015 04:19   Ct Head Wo Contrast  03/24/2015   CLINICAL DATA:  Flu shot yesterday around 3 p.m. Generalized weakness, back pain, dizziness, and tremors.  EXAM: CT HEAD WITHOUT CONTRAST  TECHNIQUE: Contiguous axial images were obtained from the base of the skull through the vertex without intravenous contrast.  COMPARISON:  None.  FINDINGS: Diffuse cerebral atrophy. Ventricular dilatation consistent with  central atrophy. Low-attenuation changes in the deep white matter consistent with small vessel ischemia. No mass effect or midline shift. No abnormal extra-axial fluid collections. Gray-white matter junctions are distinct. Basal cisterns are not effaced. No evidence of acute intracranial hemorrhage. No depressed skull fractures. Visualized paranasal sinuses and mastoid air cells are not opacified.  IMPRESSION: No acute intracranial abnormalities. Chronic atrophy and small vessel ischemic changes.   Electronically Signed   By: Lucienne Capers M.D.   On: 03/24/2015 05:37    Admit for CP rule out and further work up     Shantanique Hodo, MD 03/24/15 [phone]

## 2015-03-24 NOTE — ED Notes (Signed)
Phone report given to [employer]

## 2015-03-25 DIAGNOSIS — M791 Myalgia, unspecified site: Secondary | ICD-10-CM | POA: Insufficient documentation

## 2015-03-25 DIAGNOSIS — R140 Abdominal distension (gaseous): Secondary | ICD-10-CM | POA: Diagnosis not present

## 2015-03-25 DIAGNOSIS — R079 Chest pain, unspecified: Secondary | ICD-10-CM | POA: Diagnosis not present

## 2015-03-25 DIAGNOSIS — R531 Weakness: Secondary | ICD-10-CM

## 2015-03-25 LAB — BASIC METABOLIC PANEL
Anion gap: 6 (ref 5–15)
BUN: 19 mg/dL (ref 6–20)
CO2: 27 mmol/L (ref 22–32)
CREATININE: 1.33 mg/dL — AB (ref 0.61–1.24)
Calcium: 8.9 mg/dL (ref 8.9–10.3)
Chloride: 106 mmol/L (ref 101–111)
GFR, EST AFRICAN AMERICAN: 57 mL/min — AB (ref >60)
GFR, EST NON AFRICAN AMERICAN: 49 mL/min — AB (ref >60)
Glucose, Bld: 102 mg/dL — ABNORMAL HIGH (ref 65–99)
Potassium: 4 mmol/L (ref 3.5–5.1)
SODIUM: 139 mmol/L (ref 135–145)

## 2015-03-25 LAB — VITAMIN D 25 HYDROXY (VIT D DEFICIENCY, FRACTURES): Vit D, 25-Hydroxy: 43.4 ng/mL (ref 30.0–100.0)

## 2015-03-25 LAB — CBC
HCT: 35.7 % — ABNORMAL LOW (ref 39.0–52.0)
Hemoglobin: 12.8 g/dL — ABNORMAL LOW (ref 13.0–17.0)
MCH: 35.4 pg — ABNORMAL HIGH (ref 26.0–34.0)
MCHC: 35.9 g/dL (ref 30.0–36.0)
MCV: 98.6 fL (ref 78.0–100.0)
PLATELETS: 75 10*3/uL — AB (ref 150–400)
RBC: 3.62 MIL/uL — AB (ref 4.22–5.81)
RDW: 13.6 % (ref 11.5–15.5)
WBC: 3.7 10*3/uL — AB (ref 4.0–10.5)

## 2015-03-25 LAB — ACTH STIMULATION, 3 TIME POINTS
CORTISOL 60 MIN: 33.2 ug/dL
CORTISOL BASE: 16.3 ug/dL
Cortisol, 30 Min: 26.2 ug/dL

## 2015-03-25 LAB — MPO/PR-3 (ANCA) ANTIBODIES: Myeloperoxidase Abs: <9 U/mL (ref 0.0–9.0)

## 2015-03-25 LAB — RHEUMATOID FACTOR

## 2015-03-25 LAB — CORTISOL-AM, BLOOD: CORTISOL - AM: 15.1 ug/dL (ref 6.7–22.6)

## 2015-03-25 LAB — HEMOGLOBIN A1C
HEMOGLOBIN A1C: 5.4 % (ref 4.8–5.6)
Mean Plasma Glucose: 108 mg/dL

## 2015-03-25 LAB — C DIFFICILE QUICK SCREEN W PCR REFLEX
C DIFFICILE (CDIFF) INTERP: NEGATIVE
C DIFFICILE (CDIFF) TOXIN: NEGATIVE
C DIFFICLE (CDIFF) ANTIGEN: NEGATIVE

## 2015-03-25 LAB — PSA

## 2015-03-25 LAB — ANTINUCLEAR ANTIBODIES, IFA: ANTINUCLEAR ANTIBODIES, IFA: NEGATIVE

## 2015-03-25 MED ORDER — COSYNTROPIN 0.25 MG IJ SOLR: 0.2500 mg | Freq: Once | INTRAMUSCULAR | Status: DC

## 2015-03-25 MED ORDER — ASPIRIN EC 81 MG PO TBEC
81.0000 mg | DELAYED_RELEASE_TABLET | Freq: Every day | ORAL | Status: DC
  Administered 2015-03-25: 81 mg via ORAL
  Filled 2015-03-25: qty 1

## 2015-03-25 MED ORDER — CLONAZEPAM 0.5 MG PO TABS: 0.5000 mg | ORAL_TABLET | Freq: Every evening | ORAL | Status: DC | PRN

## 2015-03-25 MED ORDER — COSYNTROPIN 0.25 MG IJ SOLR
0.2500 mg | Freq: Once | INTRAMUSCULAR | Status: AC
  Administered 2015-03-25: 0.25 mg via INTRAVENOUS
  Filled 2015-03-25: qty 0.25

## 2015-03-25 NOTE — Discharge Summary (Signed)
[facility] Discharge Summary  Patient name: [name] Medical record number: [id number] Date of birth: [birth year] Age: 81 y.o. Gender: male Date of Admission: 03/24/2015  Date of Discharge: 03/25/2015  Admitting Physician: Zenia Resides, MD  Primary Care Provider: Dorcas Mcmurray, MD Consultants: None  Indication for Hospitalization:  Generalized weakness Arthralgia  Discharge Diagnoses/Problem List:  Patient Active Problem List   Diagnosis Date Noted  . Myalgia   . Weakness   . Bloating   . Chest pain at rest 03/24/2015  . Atypical chest pain 03/24/2015  . Fatigue 03/14/2015  . Hypothyroidism 10/26/2013  . OSA (obstructive sleep apnea) 10/18/2013  . Well adult health check 01/02/2013  . Other symptoms involving urinary system(788.99) 12/21/2012  . Benign localized hyperplasia of prostate with urinary obstruction and other lower urinary tract symptoms (LUTS)(600.21) 12/21/2012  . Carcinoma in situ of prostate 12/21/2012  . Left shoulder pain 08/08/2012  . Obesity 07/24/2011  . Generalized anxiety disorder 07/24/2011  . DIVERTICULOSIS OF COLON 12/25/2009  . DERMATITIS, SEBORRHEIC 12/25/2009  . 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 Memory loss 08/14/2009  . BACK PAIN, LUMBAR 07/19/2009  . PROSTATE CANCER 09/09/2006  . HYPERLIPIDEMIA 09/09/2006  . HYPERTENSION, BENIGN SYSTEMIC 09/09/2006  . CORONARY, ARTERIOSCLEROSIS 09/09/2006  . VENOUS INSUFFICIENCY, CHRONIC 09/09/2006  . GASTROESOPHAGEAL REFLUX, NO ESOPHAGITIS 09/09/2006  . HERNIA, HIATAL, NONCONGENITAL 09/09/2006  . INSOMNIA NOS 09/09/2006     Disposition: Home  Discharge Condition: Stable  Discharge Exam:  Temp: [97.9 F (36.6 C)-98.4 F (36.9 C)] 97.9 F (36.6 C) (09/12 0530) Pulse Rate: [63-79] 63 (09/12 0751) Resp: [18-20] 18 (09/12 0530) BP: (112-156)/(45-75) 112/64 mmHg (09/12 0751) SpO2: [95 %-99 %] 98 % (09/12 0530) Weight: [205 lb 14.4 oz (93.396 kg)] 205 lb  14.4 oz (93.396 kg) (09/12 0530) Physical Exam: General: NAD, sitting at side of bed doing cross word puzzles Cardiovascular: RRR no murmurs auscultated Respiratory: CTAB normal WOB Abdomen: soft, non tender, mildly protuberant, + BS Extremities: no LE edema, right skin tenderness to palpation no calve tenderness MSK: 5/5 b/l upper and lower extremity strength Neuro: AO x3, no focal deficits, normal gait  Brief Hospital Course:  [name] is a 81 y.o. male presenting with weakness, diffuse myalgias and arthralgias. PMH is significant for prostate cancer, CAD, GERD, venous insufficiency, hiatal hernia.  On admission he noted that he was having diffuse bony pain including anterior rib pain concerning for chest pain. His Troponins were trended and remained negative, He additionally had EKG x2 which were negative for ischemic changes. He denied chest pain and shortness of breath.  He was given IV morphine x1 in the ED and his diffuse arthralgic pain resoled. Additionally his muscle pain resolved as well. Given his muscle pain and weakness there was concern that his Lipitor was contributing and it was held. However, this was not a new medication and his CK was normal on admission.  There was also concern for potential CNS process as he presented with headache and weakness. He had a CT head which was negative for acute process. Given his sudden onset weakness with history of prolonged fatigue there was concern for autoimmune process. His labs were significant for ESR of 20 and CRP 4.4. He had a ACTH stimulation test which was negative. Given his bony pain in the setting of a history of prostate cancer, a PSA was drawn which was negative.  By day of discharge he denied any pain after receiving only morphine in the  Emergency department and denied weakness. PT/OT were consulted and felt he had no need for further outpatient needs  Issues for Follow Up:  1. Consideration of restarting home  Lipitor 2. Follow up inpatient labs: ANCA, RF, RPR 3. Weakness, if recurs consider outpatient referral to neurology for further work up  Significant Procedures:  None  Significant Labs and Imaging:   Recent Labs Lab 03/24/15 0330 03/25/15 0525  WBC 6.6 3.7*  HGB 12.8* 12.8*  HCT 36.5* 35.7*  PLT 100* 75*    Recent Labs Lab 03/24/15 0330 03/25/15 0525  NA 137 139  K 4.5 4.0  CL 102 106  CO2 26 27  GLUCOSE 137* 102*  BUN 23* 19  CREATININE 1.43* 1.33*  CALCIUM 9.1 8.9      Results/Tests Pending at Time of Discharge:  ANCA, RF, RPR  Discharge Medications:    Medication List    STOP taking these medications        atorvastatin 10 MG tablet  Commonly known as:  LIPITOR      TAKE these medications        acetaminophen 500 MG tablet  Commonly known as:  TYLENOL  Take 500 mg by mouth daily as needed (pain).     albuterol 108 (90 BASE) MCG/ACT inhaler  Commonly known as:  PROVENTIL HFA  Inhale 2 puffs into the lungs every 4 (four) hours as needed for wheezing.     amLODipine 2.5 MG tablet  Commonly known as:  NORVASC  Take 1 tablet (2.5 mg total) by mouth daily.     aspirin 81 MG chewable tablet  Chew 162 mg by mouth every other day.     BAYER ASPIRIN 325 MG tablet  Generic drug:  aspirin  Take 325 mg by mouth every other day. Alternate with #2 81 mg aspirins     benzonatate 100 MG capsule  Commonly known as:  TESSALON  Take 1 capsule (100 mg total) by mouth 3 (three) times daily as needed for cough.     BION TEARS OP  Place 1 drop into both eyes daily as needed (dry eyes).     carvedilol 3.125 MG tablet  Commonly known as:  COREG  Take 1 tablet (3.125 mg total) by mouth 2 (two) times daily with a meal.     cetirizine 10 MG tablet  Commonly known as:  ZYRTEC  Take 10 mg by mouth See admin instructions. Take 1 tablet (10 mg) by mouth daily during allergy season     clonazePAM 0.5 MG tablet  Commonly known as:  KLONOPIN  Take 1 tablet (0.5 mg  total) by mouth at bedtime.     econazole nitrate 1 % cream  Apply topically daily.     fluticasone 50 MCG/ACT nasal spray  Commonly known as:  FLONASE  Place 2 sprays into both nostrils 2 (two) times daily.     HYDROcodone-acetaminophen 5-325 MG per tablet  Commonly known as:  NORCO/VICODIN  Take 1 tablet by mouth every 8 (eight) hours as needed.     ibuprofen 200 MG tablet  Commonly known as:  ADVIL,MOTRIN  Take 200-400 mg by mouth daily as needed (pain).     levothyroxine 75 MCG tablet  Commonly known as:  SYNTHROID, LEVOTHROID  Take 1 tablet (75 mcg total) by mouth daily.     losartan-hydrochlorothiazide 100-12.5 MG per tablet  Commonly known as:  HYZAAR  Take 1 tablet by mouth daily.     mometasone 0.1 % cream  Commonly known as:  ELOCON  Apply 1 application topically daily.     nitroGLYCERIN 0.4 MG/SPRAY spray  Commonly known as:  NITROLINGUAL  Place 1 spray under the tongue every 5 (five) minutes x 3 doses as needed for chest pain.     omeprazole 20 MG capsule  Commonly known as:  PRILOSEC  Take 2 capsules (40 mg total) by mouth daily. For 1-2 weeks, then resume previous dosing.     omeprazole 20 MG capsule  Commonly known as:  PRILOSEC  TAKE 1 CAPSULE (20 MG TOTAL) BY MOUTH DAILY.     OVER THE COUNTER MEDICATION  Place 1 drop into both eyes at bedtime as needed (dry eyes). Thick lubricant eye drop (OTC)     OVER THE COUNTER MEDICATION  Take 1 tablet by mouth daily. "Happy, Calm, Focused" OTC supplement     polyethylene glycol packet  Commonly known as:  MIRALAX / GLYCOLAX  Take 17 g by mouth daily. Mix in 8 oz liquid and drink     triamcinolone cream 0.1 %  Commonly known as:  KENALOG  Apply 1 application topically daily as needed (rash).     VITAMIN B-12 SL  Place 1 tablet under the tongue daily.     Vitamin D 2000 UNITS tablet  Take 2,000 Units by mouth daily with lunch.        Discharge Instructions: Please refer to Patient Instructions  section of EMR for full details.  Patient was counseled important signs and symptoms that should prompt return to medical care, changes in medications, dietary instructions, activity restrictions, and follow up appointments.   Follow-Up Appointments:     Follow-up Information    Follow up with Dorcas Mcmurray, MD On 04/03/2015.   Specialties:  Family Medicine, Sports Medicine   Why:  10:00 AM   Contact information:   [address] [place] [address] [place], MD 03/25/2015, 3:17 PM PGY-2, [place]

## 2015-03-25 NOTE — Progress Notes (Signed)
Pt had 3 loose BM this am and notified Dr\. Haney and instructed ok to pt pt on enteric isolation.  Also stool sample sent to lab.  Will continue to monitor.  Karie Kirks, Therapist, sports.

## 2015-03-25 NOTE — Progress Notes (Signed)
Occupational Therapy Evaluation Patient Details Name: [name] MRN: [id number] DOB: January 21, 1934 Today's Date: 03/25/2015    History of Present Illness [name] is a 81 y.o. male presenting with chest pain, diffuse myalgias and arthralgias. PMH is significant for prostate cancer, CAD, GERD, venous insufficiency, hiatal hernia.   Clinical Impression   Patient presents to OT performing ADLs/mobility independently. No further OT needs identified. Will sign off.    Follow Up Recommendations  No OT follow up    Equipment Recommendations  None recommended by OT    Recommendations for Other Services       Precautions / Restrictions Precautions Precautions: None Restrictions Weight Bearing Restrictions: No      Mobility Bed Mobility                  Transfers Overall transfer level: Independent Equipment used: None                  Balance                                            ADL Overall ADL's : Independent;At baseline                                       General ADL Comments: Patient reports since he received morphine shot, he is able to perform ADLs/mobility independently     Vision     Perception     Praxis      Pertinent Vitals/Pain Pain Assessment: No/denies pain     Hand Dominance Right   Extremity/Trunk Assessment Upper Extremity Assessment Upper Extremity Assessment: Overall WFL for tasks assessed   Lower Extremity Assessment Lower Extremity Assessment: Defer to PT evaluation   Cervical / Trunk Assessment Cervical / Trunk Assessment: Normal   Communication Communication Communication: No difficulties   Cognition Arousal/Alertness: Awake/alert Behavior During Therapy: WFL for tasks assessed/performed Overall Cognitive Status: Within Functional Limits for tasks assessed                     General Comments       Exercises       Shoulder Instructions      Home  Living Family/patient expects to be discharged to:: Private residence Living Arrangements: Spouse/significant other Available Help at Discharge: Available 24 hours/day Type of Home: House       Home Layout: Two level;Bed/bath upstairs     Bathroom Shower/Tub: Occupational psychologist: Standard     Home Equipment: None          Prior Functioning/Environment Level of Independence: Independent             OT Diagnosis: Other (comment) (pain which has since resolved)   OT Problem List: Other (comment) (pain which has since resolved)   OT Treatment/Interventions:      OT Goals(Current goals can be found in the care plan section) Acute Rehab OT Goals OT Goal Formulation: All assessment and education complete, DC therapy  OT Frequency:     Barriers to D/C:            Co-evaluation              End of Session    Activity Tolerance: Patient tolerated treatment well Patient  left: in bed;with call bell/phone within reach;with nursing/sitter in room   Time: 0900-0915 OT Time Calculation (min): 15 min Charges:  OT General Charges $OT Visit: 1 Procedure OT Evaluation $Initial OT Evaluation Tier I: 1 Procedure G-Codes: OT G-codes **NOT FOR INPATIENT CLASS** Functional Limitation: Self care Self Care Current Status [id number]): 0 percent impaired, limited or restricted Self Care Goal Status [id number]): 0 percent impaired, limited or restricted Self Care Discharge Status [id number]): 0 percent impaired, limited or restricted  Karisha Marlin A 03/25/2015, 9:20 AM

## 2015-03-25 NOTE — Evaluation (Signed)
Physical Therapy Evaluation Patient Details Name: [name] MRN: [id number] DOB: 1934/05/03 Today's Date: 03/25/2015   History of Present Illness  81 y/o male with PMH of HTN, prostate CA, CAD, OSA on CPAP who p/w CP, arthralgias, myalgias and fatigue. CP is sharp, non radiating, not related to exertion, across the bottom of his chest. No n/v, no diarrhea, no visual disturbances, no jaw claudication. He does complain of difficulty and difficulty getting up from a seated position. He also complains of a HA today assoc with subjective fevers.  Clinical Impression  Patient evaluated by Physical Therapy with no further acute PT needs identified. All education has been completed and the patient has no further questions. Pt's symptoms came on very rapidly and though pt asymptomatic on eval, given that this has happened 2x now in recent weeks, recommend that pt have someone with him when biking, exercising, etc.  See below for any follow-up Physial Therapy or equipment needs. PT is signing off. Thank you for this referral.     Follow Up Recommendations No PT follow up    Equipment Recommendations  None recommended by PT    Recommendations for Other Services       Precautions / Restrictions Precautions Precautions: None Restrictions Weight Bearing Restrictions: No      Mobility  Bed Mobility Overal bed mobility: Independent                Transfers Overall transfer level: Independent Equipment used: None             General transfer comment: pt able to stand without use of hands, acute weakness that he experienced when he came in has resolved  Ambulation/Gait Ambulation/Gait assistance: Independent Ambulation Distance (Feet): 300 Feet Assistive device: None Gait Pattern/deviations: WFL(Within Functional Limits) Gait velocity: WFL Gait velocity interpretation: at or above normal speed for age/gender General Gait Details: normal gait pattern, no weakness or  instability noted  Stairs Stairs: Yes Stairs assistance: Independent Stair Management: One rail Right;Alternating pattern;Forwards Number of Stairs: 10 General stair comments: no noted quad weakness or balance issues on stairs   Wheelchair Mobility    Modified Rankin (Stroke Patients Only)       Balance Overall balance assessment: No apparent balance deficits (not formally assessed)                                           Pertinent Vitals/Pain Pain Assessment: No/denies pain  O2 sats 97%, HR 77 bpm, after ambulating and stairs    Home Living Family/patient expects to be discharged to:: Private residence Living Arrangements: Spouse/significant other Available Help at Discharge: Available 24 hours/day Type of Home: House Home Access: Stairs to enter Entrance Stairs-Rails: None Entrance Stairs-Number of Steps: 1/2 step Home Layout: Two level;Bed/bath upstairs Home Equipment: None      Prior Function Level of Independence: Independent         Comments: pt very active, [place] with wife frequently     Hand Dominance   Dominant Hand: Right    Extremity/Trunk Assessment   Upper Extremity Assessment: Overall WFL for tasks assessed           Lower Extremity Assessment: Overall WFL for tasks assessed      Cervical / Trunk Assessment: Normal  Communication   Communication: No difficulties  Cognition Arousal/Alertness: Awake/alert Behavior During Therapy: WFL for tasks assessed/performed  Overall Cognitive Status: Within Functional Limits for tasks assessed                      General Comments General comments (skin integrity, edema, etc.): VSS throughout eval    Exercises Other Exercises Other Exercises: discussed return to normal activity, cleared for riding back as no current balance or strength deficits found. Though given the speed of onset of symptoms, recommend that he have someone with him when  biking      Assessment/Plan    PT Assessment Patent does not need any further PT services  PT Diagnosis Acute pain   PT Problem List    PT Treatment Interventions     PT Goals (Current goals can be found in the Care Plan section) Acute Rehab PT Goals Patient Stated Goal: return to home and normal activity PT Goal Formulation: All assessment and education complete, DC therapy    Frequency     Barriers to discharge        Co-evaluation               End of Session   Activity Tolerance: Patient tolerated treatment well Patient left: in bed;with call bell/phone within reach;with family/visitor present Nurse Communication: Mobility status    Functional Assessment Tool Used: clinical judgement Functional Limitation: Mobility: Walking and moving around Mobility: Walking and Moving Around Current Status [phone]): 0 percent impaired, limited or restricted Mobility: Walking and Moving Around Goal Status [phone]): 0 percent impaired, limited or restricted Mobility: Walking and Moving Around Discharge Status [phone]): 0 percent impaired, limited or restricted    Time: [id number] PT Time Calculation (min) (ACUTE ONLY): 18 min   Charges:   PT Evaluation $Initial PT Evaluation Tier I: 1 Procedure     PT G Codes:   PT G-Codes **NOT FOR INPATIENT CLASS** Functional Assessment Tool Used: clinical judgement Functional Limitation: Mobility: Walking and moving around Mobility: Walking and Moving Around Current Status [id number]): 0 percent impaired, limited or restricted Mobility: Walking and Moving Around Goal Status [id number]): 0 percent impaired, limited or restricted Mobility: Walking and Moving Around Discharge Status [phone]): 0 percent impaired, limited or restricted   Leighton Roach, PT  Acute Rehab Services  [phone]  Leighton Roach 03/25/2015, 12:17 PM

## 2015-03-25 NOTE — Discharge Instructions (Signed)
You were admitted for generalized weakness.  Please follow up as follows  Follow-up Information    Follow up with Dorcas Mcmurray, MD On 04/03/2015.   Specialties:  Family Medicine, Sports Medicine   Why:  10:00 AM   Contact information:   [address] [place] [address] [phone]

## 2015-03-25 NOTE — Progress Notes (Signed)
At 1518 pt d/c to home after d/c  Instructions explained and given to pt.  Verbalized understanding.  Declined w/c for d/c off floor.  Escorted by his wife.  Karie Kirks, Therapist, sports.

## 2015-03-25 NOTE — Progress Notes (Signed)
Family Medicine Teaching Service Daily Progress Note Intern Pager: [phone]  Patient name: [name] Medical record number: [id number] Date of birth: [birth year] Age: 81 y.o. Gender: male  Primary Care Provider: Dorcas Mcmurray, MD Consultants: None Code Status: Full  Pt Overview and Major Events to Date:  9/12: Admitted for weakness, diffuse arthalgia  Assessment and Plan:  [name] is a 81 y.o. male presenting with chest pain, diffuse myalgias and arthralgias. PMH is significant for prostate cancer, CAD, GERD, venous insufficiency, hiatal hernia.  Chest pain: Pt denies having chest pain as part of his presenting complaint and denies it at this time. Work  Up for chest pain has been negative with negative EKG and negative troponins. Pt reports the rib pain he was endorsing has resolved - D/c tele - cont aspirin 81  Headache: Resolved- CT head with no acute changes. No neurological deficits. Likely a tension headache.  -tylenol 643m q4hr prn  -continue to monitor   Arthralgias: Bony pain has largely resolved, only now point tenderness over right shin, no noticeable pain prior to examiner palpating.  Pt has significant history of arthritis which he feels his current pain is due to. He denies muscle pain or soreness. Given bone pain and history of prostate cancer in 1990s concerning for oncologic cause  -Continue home vitamin D 1000 units daily. -PT/OT -ACTH stim, AM cortisol negative -f/u ANCA, ANA -ESR 20 , CRP 4.4 -f/uVitamin D level - PSA today   Weakness- This is second a episode of weakness he has had, presented to clinic for it on 9/1 per the patient. A that time pt states it lasted to 1-2 days then resolved. This time he started to feel weak the evening of presenting such that he needed help with ambulation. The differential for his weakness is broad including, rheumatologic such as polymyositis, neurologic such as gap junction abnormality, autoimmune.  - will continue to  discuss with patient and wife regarding symtoms - PT/OT -   Loose stools- pt reports regular diarrhea associated with Miralax use for chronic constipation due to vicodin use - pt had 4 loose stools this AM - per unit protocol will place on enteric precautions, will continue to monitor but consider d/c precautions if no more stools  AKI on CKD stage 3:  Cr 1.33 <<.43 on admission, BL Cr ~1.1-1.2.  -Hold ARB for now -Continue to monitor   Normocytic anemia: Hgb 12.8. Previously 14.3 on 03/14/2015. No prior anemia panel. Patient denies any acute blood loss and melena or hematochezia.  -Anemia panel including folate, vitamin b12, iron, TIBC, ferritin, reticulocytes -FOBT -Continue to monitor  Thrombocytopenia: Platelets 75<<100 Baseline platelet level over past year has been in 120-140 range. No history of liver disease or alcohol abuse.  - Continue to monitor   Hypertension: Stable blood pressures. On amlodipine 2.568mdaily, carvedilol 3.125 mg twice a day, HCTZ 12.57m56maily, losartan 100m41mily at home - Hold losartan 100mg4mly for now given AKI - Continue other home meds  Hypothyroidism: Last TSH 4.215 on 03/14/2015 -Continue home levothyroxine 757mcg51mly.  Anxiety: Stable - Continue home clonazepam 0.57mg QH67m FEN/GI: Heart healthy Prophylaxis: Subq hep   Disposition: Pending clinical improvement  Subjective:  Feels improved weakness. Now at baseline strength, with no issues. Improved headache and bony pain since morphine administration in the ED. Denies chest pain, SOB  Objective: Temp:  [97.9 F (36.6 C)-98.4 F (36.9 C)] 97.9 F (36.6 C) (09/12 0530) Pulse Rate:  [63-79] 63 (09/12  5015) Resp:  [18-20] 18 (09/12 0530) BP: (112-156)/(45-75) 112/64 mmHg (09/12 0751) SpO2:  [95 %-99 %] 98 % (09/12 0530) Weight:  [205 lb 14.4 oz (93.396 kg)] 205 lb 14.4 oz (93.396 kg) (09/12 0530) Physical Exam: General: NAD, sitting at side of bed doing cross word  puzzles Cardiovascular: RRR no murmurs auscultated Respiratory: CTAB normal WOB Abdomen: soft, non tender, mildly protuberant, + BS Extremities: no LE edema, right skin tenderness to palpation no calve tenderness  Laboratory:  Recent Labs Lab 03/24/15 0330 03/25/15 0525  WBC 6.6 3.7*  HGB 12.8* 12.8*  HCT 36.5* 35.7*  PLT 100* 75*    Recent Labs Lab 03/24/15 0330 03/25/15 0525  NA 137 139  K 4.5 4.0  CL 102 106  CO2 26 27  BUN 23* 19  CREATININE 1.43* 1.33*  CALCIUM 9.1 8.9  GLUCOSE 137* 102*      Imaging/Diagnostic Tests: CT  Head: IMPRESSION: No acute intracranial abnormalities. Chronic atrophy and small vessel ischemic changes.  CXR IMPRESSION: Emphysematous and chronic bronchitic changes in the lungs. No evidence of active pulmonary disease.     Veatrice Bourbon, MD 03/25/2015, 9:19 AM PGY-2, [place] Intern pager: [phone], text pages welcome

## 2015-03-26 LAB — ANCA TITERS: Atypical P-ANCA titer: <1:20 {titer}

## 2015-03-26 LAB — RPR: RPR Ser Ql: NONREACTIVE

## 2015-03-29 MED ORDER — HYDROCODONE-ACETAMINOPHEN 5-325 MG PO TABS: 0.5000 | ORAL_TABLET | Freq: Two times a day (BID) | ORAL | Status: DC | PRN

## 2015-03-29 NOTE — Telephone Encounter (Signed)
Dear Dema Severin Team Please tell him rx is ready for pick up [facility]! Dorcas Mcmurray

## 2015-04-01 NOTE — Telephone Encounter (Signed)
PT informed of below, he has a appt on wed and we will verify that it is up front for pick up when he comes in per Dr. Nori Riis message below. [name], [name], [place]

## 2015-04-03 ENCOUNTER — Ambulatory Visit (INDEPENDENT_AMBULATORY_CARE_PROVIDER_SITE_OTHER): Payer: Medicare Other | Admitting: Family Medicine

## 2015-04-03 ENCOUNTER — Encounter: Payer: Self-pay | Admitting: Family Medicine

## 2015-04-03 VITALS — BP 132/70 | HR 57 | Temp 97.7°F | Ht 67.0 in | Wt 205.3 lb

## 2015-04-03 DIAGNOSIS — E039 Hypothyroidism, unspecified: Secondary | ICD-10-CM

## 2015-04-03 DIAGNOSIS — I10 Essential (primary) hypertension: Secondary | ICD-10-CM | POA: Diagnosis present

## 2015-04-03 DIAGNOSIS — R140 Abdominal distension (gaseous): Secondary | ICD-10-CM | POA: Diagnosis not present

## 2015-04-03 DIAGNOSIS — R413 Other amnesia: Secondary | ICD-10-CM | POA: Diagnosis not present

## 2015-04-03 MED ORDER — LEVOTHYROXINE SODIUM 88 MCG PO TABS: 88.0000 ug | ORAL_TABLET | Freq: Every day | ORAL | Status: DC

## 2015-04-03 MED ORDER — HYDROCODONE-ACETAMINOPHEN 5-325 MG PO TABS: 0.5000 | ORAL_TABLET | Freq: Two times a day (BID) | ORAL | Status: DC | PRN

## 2015-04-03 NOTE — Assessment & Plan Note (Signed)
By his report this seems stable.

## 2015-04-03 NOTE — Assessment & Plan Note (Signed)
Long discussion. He waxed and waned regarding his thoughts on his current treatment. Ultimately it seemed like he was thinking he needed a little bit more so I agreed to increase and 88 g and will follow-up in 6 weeks with an office visit and laboratory results of the time.

## 2015-04-03 NOTE — Assessment & Plan Note (Signed)
Pretty well controlled and stable right now. No medication changes.

## 2015-04-03 NOTE — Patient Instructions (Signed)
We are trying a slightly higher dose of your synthroid---I have called in a new rx---replace the old ones. I would recheck you thyroid level when I see you back in 6 weeks.

## 2015-04-03 NOTE — Assessment & Plan Note (Signed)
Given the fact that he discuss this with his gastroenterologist and there was evidently nothing different to do other than changes probiotic, I recommended he try some over-the-counter simethicone.

## 2015-04-03 NOTE — Progress Notes (Signed)
   Subjective:    Patient ID: [name], male    DOB: 1934/06/26, 81 y.o.   MRN: [id number]  HPI He is here today accompanied by his wife. Follow-up recent hospitalization. Has felt fine since he went home. No return of his overall myalgias. #2. He was to talk about his thyroid replacement. He alternately thinks it is either helping him are hurting him. He does feel a lot more tired than he did before he started taking it but then he realizes that he started taking it because he was feeling fatigued and we checked his thyroid. His daughter has some thyroid issues and they've been discussing this. He thinks he is not on enough replacement. #3. Continues to have knee pains and is not having a lot of difficulty walking. Would like to apply for handicap parking sticker. His wife is doing all the driving partly because of his knee pains and partly because he doesn't see as well as he used to. #4. Recently saw his gastroenterologist to change his brand of probiotics. Since then he's had much more regular bowel movements. He is feeling a lot of bloatedness.   Review of Systems See history of present illness. No fever, sweats, chills. He feels bloated much of the time. Less constipation alternating with diarrhea and more regular bowel movements. Denies chest pain, shortness of breath. Denies episodes of confusion. He is having some difficulty hearing. Says he is also quite forgetful.    Objective:   Physical Exam  Vital signs reviewed. GENERAL: Well-developed, well-nourished, no acute distress. CARDIOVASCULAR: Regular rate and rhythm no murmur gallop or rub LUNGS: Clear to auscultation bilaterally, no rales or wheeze. ABDOMEN: Soft positive bowel sounds. No masses are noted. Nontender. He is obese, I can't really tell that there is a lot of bloating. NEURO: No gross focal neurological deficits. MSK: Movement of extremity x 4. Bilateral knees have some external changes consistent with  osteoarthritis. He lacks full extension on the left. He has full extension on the right but bilateral stiffness. His gait is antalgic and he lacks full normal extension with his stride.        Assessment & Plan:  #1. Follow-up hospitalization. After discussion today he realizes he took his flu shot the day before that and perhaps his all over myalgias and arthralgias related to that. Either way it has resolved #2. We'll give him a handicap sticker for his osteoarthritis in his knees.

## 2015-04-22 ENCOUNTER — Other Ambulatory Visit: Payer: Self-pay | Admitting: *Deleted

## 2015-04-22 MED ORDER — AMLODIPINE BESYLATE 2.5 MG PO TABS: 2.5000 mg | ORAL_TABLET | Freq: Every day | ORAL | Status: DC

## 2015-04-24 ENCOUNTER — Telehealth: Payer: Self-pay | Admitting: Family Medicine

## 2015-04-24 NOTE — Telephone Encounter (Signed)
Ok to establish 

## 2015-04-24 NOTE — Telephone Encounter (Signed)
Caller name:PATIENT Relationship to patient:[name] Can be reached:[phone] Pharmacy:  Reason for call: HE WOULD LIKE TO BE YOUR PATIENT.  YOUR PATIENT [name] RECOMMENDED YOU.  HE KNOWS YOU WILL BE GLAD TO MOVE WITH YOU TO [place]

## 2015-05-15 ENCOUNTER — Ambulatory Visit: Payer: Medicare Other | Admitting: Family Medicine

## 2015-05-24 DIAGNOSIS — E039 Hypothyroidism, unspecified: Secondary | ICD-10-CM | POA: Diagnosis not present

## 2015-05-30 ENCOUNTER — Encounter: Payer: Self-pay | Admitting: Family Medicine

## 2015-05-30 ENCOUNTER — Ambulatory Visit (INDEPENDENT_AMBULATORY_CARE_PROVIDER_SITE_OTHER): Payer: Medicare Other | Admitting: Family Medicine

## 2015-05-30 VITALS — BP 124/78 | HR 60 | Temp 97.8°F | Resp 16 | Ht 67.0 in | Wt 208.6 lb

## 2015-05-30 DIAGNOSIS — G4733 Obstructive sleep apnea (adult) (pediatric): Secondary | ICD-10-CM | POA: Diagnosis not present

## 2015-05-30 DIAGNOSIS — R2681 Unsteadiness on feet: Secondary | ICD-10-CM

## 2015-05-30 DIAGNOSIS — E039 Hypothyroidism, unspecified: Secondary | ICD-10-CM | POA: Diagnosis not present

## 2015-05-30 DIAGNOSIS — I10 Essential (primary) hypertension: Secondary | ICD-10-CM | POA: Diagnosis not present

## 2015-05-30 NOTE — Progress Notes (Signed)
   Subjective:    Patient ID: [name], male    DOB: 09-04-1933, 81 y.o.   MRN: [id number]  HPI New to establish.  Previous MD- Nori Riis  OSA- pt is not currently using CPAP.  Has not been using machine for 6 months.  He reports his drowsiness after eating did not improve w/ CPAP so he stopped using it.  HTN- chronic problem, on Losartan HCTZ, Coreg, Amlodipine.  Good control.  No CP, SOB, HAs, visual changes, edema.  Hypothyroid- chronic problem, currently on 90mg  Armour Thyroid from daughter's MD in [place].  Pt reports he had recent thyroid labs (last week) are 'right on the #s now'.  Gait instability- pt reports he will have proximal muscle weakness of legs and 'they just give out'.  'it's like flipping a switch'.  Pt was admitted to the hospital in September and had extensive work up done that was unrevealing.     Review of Systems For ROS see HPI     Objective:   Physical Exam  Constitutional: He is oriented to person, place, and time. He appears well-developed and well-nourished. No distress.  HENT:  Head: Normocephalic and atraumatic.  Eyes: Conjunctivae and EOM are normal. Pupils are equal, round, and reactive to light.  Neck: Normal range of motion. Neck supple. No thyromegaly present.  Cardiovascular: Normal rate, regular rhythm, normal heart sounds and intact distal pulses.   Pulmonary/Chest: Effort normal and breath sounds normal. No respiratory distress. He has no wheezes. He has no rales.  Abdominal: Soft. Bowel sounds are normal. He exhibits no distension. There is no tenderness. There is no rebound.  Lymphadenopathy:    He has no cervical adenopathy.  Neurological: He is alert and oriented to person, place, and time. Coordination (pt keeps stumbling each time he attempts to rise from a seated position) abnormal.  Skin: Skin is warm and dry.  Psychiatric: He has a normal mood and affect. His behavior is normal.  Tangential, rambling thought process  Vitals  reviewed.         Assessment & Plan:

## 2015-05-30 NOTE — Patient Instructions (Signed)
Follow up in 2-3 months to recheck weakness and gait We'll call you with your neuro appt for your weakness and risk of falls Start wearing your CPAP nightly (if you have difficulty w/ this, let me know!) Call with any questions or concerns If you want to join [place] at the new [place] office, any scheduled appointments will automatically transfer and we will see you at [address] [place] [address] [place], [place] [address] (OPENING 07/16/15) Happy Holidays!!!

## 2015-05-30 NOTE — Progress Notes (Signed)
Pre visit review using our clinic review tool, if applicable. No additional management support is needed unless otherwise documented below in the visit note. 

## 2015-06-02 NOTE — Assessment & Plan Note (Signed)
New to provider, ongoing for pt.  BP adequately controlled today.  Pt is asymptomatic.  Reviewed recent lab work done- no need for med changes.  Will continue to follow.

## 2015-06-02 NOTE — Assessment & Plan Note (Signed)
New to provider, ongoing for pt.  He has not worn CPAP in ~6 months.  Encouraged him to restart CPAP nightly and see if some of his symptoms of weakness and fatigue improve.  Will follow.

## 2015-06-02 NOTE — Assessment & Plan Note (Signed)
New to provider.  Pt was hospitalized in September for similar but work up was unremarkable.  Watching pt in office today he is at high risk for falls- he stumbles forward w/ each attempt to rise from a seated position.  When I told him he needed a neurology work up, he got very defensive and started talking about how the government was going to come to his house.  I asked him to explain his concern and he could not.  Will refer to neuro for complete evaluation and tx of his gait issues.  Pt expressed understanding and is in agreement w/ plan.

## 2015-06-02 NOTE — Assessment & Plan Note (Signed)
New to provider, ongoing for pt.  Recently switched to Armour thyroid 90 mg by Endo in [place].  Encouraged pt to provide copies of lab reports.  Will follow along and adjust meds prn.

## 2015-06-17 ENCOUNTER — Encounter: Payer: Self-pay | Admitting: Neurology

## 2015-06-17 ENCOUNTER — Ambulatory Visit (INDEPENDENT_AMBULATORY_CARE_PROVIDER_SITE_OTHER): Payer: Medicare Other | Admitting: Neurology

## 2015-06-17 ENCOUNTER — Other Ambulatory Visit (INDEPENDENT_AMBULATORY_CARE_PROVIDER_SITE_OTHER): Payer: Medicare Other

## 2015-06-17 VITALS — BP 124/82 | HR 57 | Ht 67.0 in | Wt 209.0 lb

## 2015-06-17 DIAGNOSIS — G609 Hereditary and idiopathic neuropathy, unspecified: Secondary | ICD-10-CM

## 2015-06-17 DIAGNOSIS — R42 Dizziness and giddiness: Secondary | ICD-10-CM

## 2015-06-17 DIAGNOSIS — Z9889 Other specified postprocedural states: Secondary | ICD-10-CM

## 2015-06-17 DIAGNOSIS — Z8249 Family history of ischemic heart disease and other diseases of the circulatory system: Secondary | ICD-10-CM

## 2015-06-17 LAB — SEDIMENTATION RATE: Sed Rate: 17 mm/hr (ref 0–22)

## 2015-06-17 NOTE — Patient Instructions (Signed)
1. Your provider has requested that you have labwork completed today. Please go to [facility] ([id number]) on the second floor of this building before leaving the office today. You do not need to check in. If you are not called within 15 minutes please check with the front desk.  2. We will call you with an appt for your EMG. 3. We have you scheduled for your Carotid Doppler on 06/18/2015 at 9:00 am. Please arrive 15 minutes prior and go to [facility], section A. If this is not a good date/time please call [phone] to reschedule.

## 2015-06-17 NOTE — Progress Notes (Signed)
[name] was seen today in neurologic consultation at the request of [name], MD.   The patient presents today for leg weakness.  I have reviewed records made available to me.  The patient was hospitalized in September, 2016 and complained of the same thing then and stated that he had had the same symptoms for 2 years at that time.   However, the patient states that suddenly his sx's were worse overnight and he just couldn't move his legs and he felt that he couldn't move the legs. He was on Lipitor then and it was stopped upon that admission because of the symptoms, although his CK was normal.  Almost like a "light switch" he could move again the next day.  Currently, he states that the sx's come and go.  Somedays, he can move well and some days he can not.  He admits that he no longer exercises and thinks that this contributes.  He has also gained 30 lbs over the winter and thinks that this was from the addition of levothyroxine (now on armour thyroid).  States that the legs "ache" some in the proximal posterior region and the toes are numb.  The left leg is worse than the right.  If he walks, he can walk the ache off.  No tremor.  No falls.  When he first gets up out of the chair, he may feel a little "wobbly" but otherwise he thinks he does well.   ALLERGIES:   Allergies  Allergen Reactions  . Ciprofloxacin Other (See Comments)    Hallucinations or jittery  . Codeine Other (See Comments)    hallucinations  . Sulfonamide Derivatives Other (See Comments)    Chills and shaking "serum sickness"    CURRENT MEDICATIONS:  Outpatient Encounter Prescriptions as of 06/17/2015  Medication Sig  . albuterol (PROVENTIL HFA) 108 (90 BASE) MCG/ACT inhaler Inhale 2 puffs into the lungs every 4 (four) hours as needed for wheezing.  Marland Kitchen amLODipine (NORVASC) 2.5 MG tablet Take 1 tablet (2.5 mg total) by mouth daily.  Francia Greaves THYROID 90 MG tablet Take 90 mg by mouth every morning.  . Artificial  Tear Solution (BION TEARS OP) Place 1 drop into both eyes daily as needed (dry eyes).  Marland Kitchen aspirin (BAYER ASPIRIN) 325 MG tablet Take 325 mg by mouth every other day. Alternate with #2 81 mg aspirins  . carvedilol (COREG) 3.125 MG tablet Take 1 tablet (3.125 mg total) by mouth 2 (two) times daily with a meal.  . cetirizine (ZYRTEC) 10 MG tablet Take 10 mg by mouth See admin instructions. Take 1 tablet (10 mg) by mouth daily during allergy season  . Cholecalciferol (VITAMIN D) 2000 UNITS tablet Take 2,000 Units by mouth daily with lunch.  . clonazePAM (KLONOPIN) 0.5 MG tablet Take 1 tablet (0.5 mg total) by mouth at bedtime. (Patient taking differently: Take 0.25 mg by mouth at bedtime as needed (when wearing CPAP mask). )  . COD LIVER OIL PO Take by mouth.  Marland Kitchen econazole nitrate 1 % cream Apply topically daily. (Patient taking differently: Apply 1 application topically daily as needed (facial dryness/itching). )  . FLUZONE HIGH-DOSE 0.5 ML SUSY TO BE ADMINISTERED BY [facility]  . HYDROcodone-acetaminophen (NORCO/VICODIN) 5-325 MG per tablet Take 0.5-1 tablets by mouth 2 (two) times daily as needed (pain).  Marland Kitchen losartan-hydrochlorothiazide (HYZAAR) 100-12.5 MG per tablet Take 1 tablet by mouth daily.  . Melatonin 1 MG/ML LIQD Take by mouth.  Marland Kitchen omeprazole (PRILOSEC) 20 MG  capsule Take 20 mg by mouth daily.  . polyethylene glycol (MIRALAX / GLYCOLAX) packet Take 17 g by mouth daily. Mix in 8 oz liquid and drink  . triamcinolone cream (KENALOG) 0.1 % Apply 1 application topically daily as needed (rash).  Marland Kitchen acetaminophen (TYLENOL) 500 MG tablet Take 500 mg by mouth daily as needed (pain).  . nitroGLYCERIN (NITROLINGUAL) 0.4 MG/SPRAY spray Place 1 spray under the tongue every 5 (five) minutes x 3 doses as needed for chest pain. (Patient not taking: Reported on 06/17/2015)  . [DISCONTINUED] Cyanocobalamin (VITAMIN B-12 SL) Place 1 tablet under the tongue daily.  . [DISCONTINUED] ibuprofen (ADVIL,MOTRIN) 200 MG tablet  Take 200-400 mg by mouth daily as needed (pain).  . [DISCONTINUED] OVER THE COUNTER MEDICATION Place 1 drop into both eyes at bedtime as needed (dry eyes). Thick lubricant eye drop (OTC)  . [DISCONTINUED] OVER THE COUNTER MEDICATION Take 1 tablet by mouth daily. "Happy, Calm, Focused" OTC supplement   No facility-administered encounter medications on file as of 06/17/2015.    PAST MEDICAL HISTORY:   Past Medical History  Diagnosis Date  . Diverticulitis     recurrent  . Tinnitus   . Hearing aid worn   . Kidney stone     lithotripsy 1478 w complications, req stents, Dr Rosana Hoes  . Prostate CA ([place])   . Complication of anesthesia     " HAD TREMERS AFTER PROSTATE SURGERY"  . Family history of anesthesia complication     MOTHER   . Coronary artery disease   . Anginal pain ([place])   . Hypertension   . Hyperlipemia   . Anxiety     " OCCASIONAL"  . Asthma due to seasonal allergies     uses inhalers prn  . Shortness of breath   . TIA (transient ischemic attack)   . GERD (gastroesophageal reflux disease)   . H/O hiatal hernia   . Arthritis     PAST SURGICAL HISTORY:   Past Surgical History  Procedure Laterality Date  . Carotid endarterectomy  RIGHT    1998  . Prostatectomy  1998    radical for prostate cancer  . Cardiac catheterization  2008    minimal dz, Dr Cathie Olden  . Cholecystectomy    . Left heart catheterization with coronary angiogram N/A 09/15/2013    Procedure: LEFT HEART CATHETERIZATION WITH CORONARY ANGIOGRAM;  Surgeon: Peter M Martinique, MD;  Location: [facility] CATH LAB;  Service: Cardiovascular;  Laterality: N/A;    SOCIAL HISTORY:   Social History   Social History  . Marital Status: Married    Spouse Name: N/A  . Number of Children: N/A  . Years of Education: N/A   Occupational History  . retired     Actor [employer]   Social History Main Topics  . Smoking status: Former Smoker -- 2.50 packs/day for 30 years    Types: Cigarettes    Quit date:  08/28/1985  . Smokeless tobacco: Never Used  . Alcohol Use: 1.2 oz/week    2 Standard drinks or equivalent per week     Comment: 1 glass wine/week (prior 1 glass martini with dinner)  . Drug Use: No  . Sexual Activity: Yes    Birth Control/ Protection: None   Other Topics Concern  . Not on file   Social History Narrative   Lives with wife--recently diagnosed with breast ca; No smoking; Occassional wine; Retired; 2 daughters live in [place]--5 grandchildren(boys). On weight watchers. Lives in [place] with wife. Retired  Solicitor. Tobacco history 2 ppd x 32 years. No smoking x 25 years. No drugs.    FAMILY HISTORY:   Family Status  Relation Status Death Age  . Mother Deceased     heart disease  . Father Deceased     heart disease, renal failure  . Sister Deceased     MI  . Sister Alive     dementia;   . Child Alive     sjogrens; hashimotos; CAD    ROS:  Some upper and low back pain.  A complete 10 system review of systems was obtained and was unremarkable apart from what is mentioned above.  PHYSICAL EXAMINATION:    VITALS:   Filed Vitals:   06/17/15 0840  BP: 124/82  Pulse: 57  Height: 5' 7"  (1.702 m)  Weight: 209 lb (94.802 kg)    GEN:  Normal appears male in no acute distress.  Appears stated age. HEENT:  Normocephalic, atraumatic. The mucous membranes are moist. The superficial temporal arteries are without ropiness or tenderness. Cardiovascular: Regular rate and rhythm. Lungs: Clear to auscultation bilaterally. Neck/Heme: There are no carotid bruits noted bilaterally.  NEUROLOGICAL: Orientation:   Montreal Cognitive Assessment  06/17/2015  Visuospatial/ Executive (0/5) 5  Naming (0/3) 3  Attention: Read list of digits (0/2) 2  Attention: Read list of letters (0/1) 1  Attention: Serial 7 subtraction starting at 100 (0/3) 3  Language: Repeat phrase (0/2) 2  Language : Fluency (0/1) 1  Abstraction (0/2) 2  Delayed Recall (0/5) 4  Orientation  (0/6) 6  Total 29  Adjusted Score (based on education) 29    Cranial nerves: There is good facial symmetry. The pupils are equal round and reactive to light bilaterally. Fundoscopic exam is attempted but the disc margins are not well visualized bilaterally. Extraocular muscles are intact and visual fields are full to confrontational testing. Speech is fluent and clear. Soft palate rises symmetrically and there is no tongue deviation. Hearing is intact to conversational tone. Tone: Tone is good throughout. Sensation: Sensation is intact to light touch and pinprick throughout (facial, trunk, extremities). Pinprick is decreased at the bilateral big toe.  Vibration is markedly decreased at the bilateral big toe. There is no extinction with double simultaneous stimulation. There is no sensory dermatomal level identified. Coordination:  The patient has no difficulty with RAM's or FNF bilaterally. Motor: Strength is 5/5 in the bilateral upper and lower extremities.  Shoulder shrug is equal and symmetric. There is no pronator drift.  There are no fasciculations noted. DTR's: Deep tendon reflexes are 1/4 at the bilateral biceps, triceps, brachioradialis, patella and absent at the bilateral achilles.  Plantar responses are downgoing bilaterally. Gait and Station: The patient is able to ambulate without difficulty. The patient is able to heel toe walk without any difficulty. The patient is unable to ambulate in a tandem fashion.  He can stand in the romberg position with eyes open but not with eyes closed.  Lab Results  Component Value Date   HGBA1C 5.4 03/24/2015   Lab Results  Component Value Date   TSH 4.215 03/14/2015      IMPRESSION/PLAN  1. Gait instability  -strong evidence of a diffuse peripheral neuropathy.  He has had almost all labs necessary for a complete PN w/u but will add SPEP/UPEP with immunofix and ESR  -will do EMG.  While he may have had some degree of myopathy while on lipitor,  I didn't see any evidence today; if he  does, EMG should certainly help [place] separate myopathic from neuropathic potentials  -talked to him extensively about safety with PN.    2.  Hx of carotid stenosis with dizziness  -he is s/p carotid endarterectomy in 1998 but states that he has not had a carotid u/s since the early 2000's.  i will do a carotid u/s.  3.  Will call him with results of the above.  Much greater than 50% of this visit was spent in counseling with the patient and the family.  Total face to face time:  60 min

## 2015-06-18 ENCOUNTER — Ambulatory Visit (HOSPITAL_COMMUNITY)
Admission: RE | Admit: 2015-06-18 | Discharge: 2015-06-18 | Disposition: A | Discharge status: Home / Self Care | Payer: Medicare Other | Source: Ambulatory Visit | Attending: Neurology | Admitting: Neurology

## 2015-06-18 ENCOUNTER — Ambulatory Visit (HOSPITAL_COMMUNITY): Payer: 59

## 2015-06-18 DIAGNOSIS — Z8249 Family history of ischemic heart disease and other diseases of the circulatory system: Secondary | ICD-10-CM

## 2015-06-18 DIAGNOSIS — Z9889 Other specified postprocedural states: Secondary | ICD-10-CM | POA: Diagnosis not present

## 2015-06-18 DIAGNOSIS — I6523 Occlusion and stenosis of bilateral carotid arteries: Secondary | ICD-10-CM | POA: Insufficient documentation

## 2015-06-18 DIAGNOSIS — R42 Dizziness and giddiness: Secondary | ICD-10-CM | POA: Diagnosis not present

## 2015-06-18 NOTE — Progress Notes (Addendum)
VASCULAR LAB PRELIMINARY  PRELIMINARY  PRELIMINARY  PRELIMINARY  Carotid duplex completed.    Preliminary report:  Right : 60% to 79% ICA  Stenosis. Vertebrla artery flow is " To -  Fro " consistent with a more proximal obstruction. Left  - 1% to 39% ICA stenosis. Vertebral artery flow is antegrade.  [name], RVS 06/18/2015, 4:19 PM

## 2015-06-20 ENCOUNTER — Telehealth: Payer: Self-pay | Admitting: Neurology

## 2015-06-20 DIAGNOSIS — I6521 Occlusion and stenosis of right carotid artery: Secondary | ICD-10-CM

## 2015-06-20 LAB — SPEP & IFE WITH QIG
ALPHA-2-GLOBULIN: 0.6 g/dL (ref 0.5–0.9)
Albumin ELP: 4.1 g/dL (ref 3.8–4.8)
Alpha-1-Globulin: 0.2 g/dL (ref 0.2–0.3)
BETA GLOBULIN: 0.4 g/dL (ref 0.4–0.6)
Beta 2: 0.4 g/dL (ref 0.2–0.5)
Gamma Globulin: 1.2 g/dL (ref 0.8–1.7)
IGG (IMMUNOGLOBIN G), SERUM: 1180 mg/dL (ref 650–1600)
IgA: 385 mg/dL — ABNORMAL HIGH (ref 68–379)
IgM, Serum: 68 mg/dL (ref 41–251)
TOTAL PROTEIN, SERUM ELECTROPHOR: 7 g/dL (ref 6.1–8.1)

## 2015-06-20 NOTE — Telephone Encounter (Signed)
-----   Message from [place], DO sent at 06/20/2015  7:56 AM EST ----- Let pt know that his L carotid looked fine.  The right shows stenosis and I need to get a better look with a more sensitive test.  If agreeable, order CTA carotid

## 2015-06-20 NOTE — Telephone Encounter (Signed)
Patient made aware of results. Okay to schedule CTA neck. Scheduled 06/26/15 at 9:45 am at [facility]. Patient made aware.

## 2015-06-21 ENCOUNTER — Encounter: Payer: Medicare Other | Admitting: Neurology

## 2015-06-21 LAB — UIFE/LIGHT CHAINS/TP QN, 24-HR UR
ALBUMIN, U: DETECTED
ALPHA 1 UR: DETECTED — AB
ALPHA 2 UR: DETECTED — AB
Beta, Urine: DETECTED — AB
Gamma Globulin, Urine: DETECTED — AB
TOTAL PROTEIN, URINE-UPE24: 11 mg/dL (ref 5–25)

## 2015-06-26 ENCOUNTER — Telehealth: Payer: Self-pay | Admitting: Neurology

## 2015-06-26 ENCOUNTER — Ambulatory Visit (HOSPITAL_COMMUNITY)
Admission: RE | Admit: 2015-06-26 | Discharge: 2015-06-26 | Disposition: A | Discharge status: Home / Self Care | Payer: Medicare Other | Source: Ambulatory Visit | Attending: Neurology | Admitting: Neurology

## 2015-06-26 DIAGNOSIS — I6521 Occlusion and stenosis of right carotid artery: Secondary | ICD-10-CM | POA: Diagnosis not present

## 2015-06-26 DIAGNOSIS — I6503 Occlusion and stenosis of bilateral vertebral arteries: Secondary | ICD-10-CM | POA: Insufficient documentation

## 2015-06-26 LAB — POCT I-STAT CREATININE: Creatinine, Ser: 1.4 mg/dL — ABNORMAL HIGH (ref 0.61–1.24)

## 2015-06-26 MED ORDER — IOHEXOL 350 MG/ML SOLN
50.0000 mL | Freq: Once | INTRAVENOUS | Status: AC | PRN
  Administered 2015-06-26: 50 mL via INTRAVENOUS

## 2015-06-26 NOTE — Telephone Encounter (Signed)
-----   Message from [place], DO sent at 06/26/2015  1:14 PM EST ----- Please make appt with me tomorrow or Friday AM to go over results

## 2015-06-26 NOTE — Telephone Encounter (Signed)
Appt made with patient.  

## 2015-06-27 ENCOUNTER — Encounter: Payer: Self-pay | Admitting: Neurology

## 2015-06-27 ENCOUNTER — Ambulatory Visit (INDEPENDENT_AMBULATORY_CARE_PROVIDER_SITE_OTHER): Payer: Medicare Other | Admitting: Neurology

## 2015-06-27 VITALS — BP 162/84 | HR 60 | Ht 67.0 in | Wt 210.0 lb

## 2015-06-27 DIAGNOSIS — I6521 Occlusion and stenosis of right carotid artery: Secondary | ICD-10-CM | POA: Diagnosis not present

## 2015-06-27 DIAGNOSIS — I6529 Occlusion and stenosis of unspecified carotid artery: Secondary | ICD-10-CM | POA: Diagnosis not present

## 2015-06-27 DIAGNOSIS — I6503 Occlusion and stenosis of bilateral vertebral arteries: Secondary | ICD-10-CM

## 2015-06-27 NOTE — Progress Notes (Signed)
[name] was seen today in neurologic consultation at the request of [name], MD.   The patient presents today for leg weakness.  I have reviewed records made available to me.  The patient was hospitalized in September, 2016 and complained of the same thing then and stated that he had had the same symptoms for 2 years at that time.   However, the patient states that suddenly his sx's were worse overnight and he just couldn't move his legs and he felt that he couldn't move the legs. He was on Lipitor then and it was stopped upon that admission because of the symptoms, although his CK was normal.  Almost like a "light switch" he could move again the next day.  Currently, he states that the sx's come and go.  Somedays, he can move well and some days he can not.  He admits that he no longer exercises and thinks that this contributes.  He has also gained 30 lbs over the winter and thinks that this was from the addition of levothyroxine (now on armour thyroid).  States that the legs "ache" some in the proximal posterior region and the toes are numb.  The left leg is worse than the right.  If he walks, he can walk the ache off.  No tremor.  No falls.  When he first gets up out of the chair, he may feel a little "wobbly" but otherwise he thinks he does well.  06/27/15 update:  The patient follows up today, slightly earlier than expected. This patient is accompanied in the office by his spouse who supplements the history. I wanted to give him the results of some of his testing.  He had a carotid ultrasound done on 06/18/2015.  There was evidence of 60-79% stenosis on the right.  He subsequently had a CTA of the neck.  There was extensive irregular plaque and at least 80% stenosis of the proximal right ICA with wide patency beyond this region.  There were 70% or greater stenosis at the origin of the bilateral vertebrals from the takeoff.    ALLERGIES:   Allergies  Allergen Reactions  . Ciprofloxacin  Other (See Comments)    Hallucinations or jittery  . Codeine Other (See Comments)    hallucinations  . Sulfonamide Derivatives Other (See Comments)    Chills and shaking "serum sickness"    CURRENT MEDICATIONS:  Outpatient Encounter Prescriptions as of 06/27/2015  Medication Sig  . acetaminophen (TYLENOL) 500 MG tablet Take 500 mg by mouth daily as needed (pain).  Marland Kitchen albuterol (PROVENTIL HFA) 108 (90 BASE) MCG/ACT inhaler Inhale 2 puffs into the lungs every 4 (four) hours as needed for wheezing.  Marland Kitchen amLODipine (NORVASC) 2.5 MG tablet Take 1 tablet (2.5 mg total) by mouth daily.  Francia Greaves THYROID 90 MG tablet Take 90 mg by mouth every morning.  . Artificial Tear Solution (BION TEARS OP) Place 1 drop into both eyes daily as needed (dry eyes).  Marland Kitchen aspirin (BAYER ASPIRIN) 325 MG tablet Take 325 mg by mouth every other day. Alternate with #2 81 mg aspirins  . carvedilol (COREG) 3.125 MG tablet Take 1 tablet (3.125 mg total) by mouth 2 (two) times daily with a meal.  . cetirizine (ZYRTEC) 10 MG tablet Take 10 mg by mouth See admin instructions. Take 1 tablet (10 mg) by mouth daily during allergy season  . Cholecalciferol (VITAMIN D) 2000 UNITS tablet Take 2,000 Units by mouth daily with lunch.  . clonazePAM (KLONOPIN)  0.5 MG tablet Take 1 tablet (0.5 mg total) by mouth at bedtime. (Patient taking differently: Take 0.25 mg by mouth at bedtime as needed (when wearing CPAP mask). )  . COD LIVER OIL PO Take by mouth.  Marland Kitchen econazole nitrate 1 % cream Apply topically daily. (Patient taking differently: Apply 1 application topically daily as needed (facial dryness/itching). )  . FLUZONE HIGH-DOSE 0.5 ML SUSY TO BE ADMINISTERED BY [facility]  . HYDROcodone-acetaminophen (NORCO/VICODIN) 5-325 MG per tablet Take 0.5-1 tablets by mouth 2 (two) times daily as needed (pain).  Marland Kitchen losartan-hydrochlorothiazide (HYZAAR) 100-12.5 MG per tablet Take 1 tablet by mouth daily.  . Melatonin 1 MG/ML LIQD Take by mouth.  .  Menaquinone-7 (VITAMIN K2 PO) Take by mouth daily.  . nitroGLYCERIN (NITROLINGUAL) 0.4 MG/SPRAY spray Place 1 spray under the tongue every 5 (five) minutes x 3 doses as needed for chest pain.  Marland Kitchen omeprazole (PRILOSEC) 20 MG capsule Take 20 mg by mouth daily.  . polyethylene glycol (MIRALAX / GLYCOLAX) packet Take 17 g by mouth daily. Mix in 8 oz liquid and drink  . triamcinolone cream (KENALOG) 0.1 % Apply 1 application topically daily as needed (rash).   No facility-administered encounter medications on file as of 06/27/2015.    PAST MEDICAL HISTORY:   Past Medical History  Diagnosis Date  . Diverticulitis     recurrent  . Tinnitus   . Hearing aid worn   . Kidney stone     lithotripsy 3235 w complications, req stents, Dr Rosana Hoes  . Prostate CA ([place])   . Complication of anesthesia     " HAD TREMERS AFTER PROSTATE SURGERY"  . Family history of anesthesia complication     MOTHER   . Coronary artery disease   . Anginal pain ([place])   . Hypertension   . Hyperlipemia   . Anxiety     " OCCASIONAL"  . Asthma due to seasonal allergies     uses inhalers prn  . Shortness of breath   . TIA (transient ischemic attack)   . GERD (gastroesophageal reflux disease)   . H/O hiatal hernia   . Arthritis     PAST SURGICAL HISTORY:   Past Surgical History  Procedure Laterality Date  . Carotid endarterectomy  Left    1998  . Prostatectomy  1998    radical for prostate cancer  . Cardiac catheterization  2008    minimal dz, Dr Cathie Olden  . Cholecystectomy    . Left heart catheterization with coronary angiogram N/A 09/15/2013    Procedure: LEFT HEART CATHETERIZATION WITH CORONARY ANGIOGRAM;  Surgeon: Peter M Martinique, MD;  Location: [facility] C[facility] LAB;  Service: Cardiovascular;  Laterality: N/A;    SOCIAL HISTORY:   Social History   Social History  . Marital Status: Married    Spouse Name: N/A  . Number of Children: N/A  . Years of Education: N/A   Occupational History  . retired     Teacher, English as a foreign language [employer]   Social History Main Topics  . Smoking status: Former Smoker -- 2.50 packs/day for 30 years    Types: Cigarettes    Quit date: 08/28/1985  . Smokeless tobacco: Never Used  . Alcohol Use: 1.2 oz/week    2 Standard drinks or equivalent per week     Comment: 1 glass wine/week (prior 1 glass martini with dinner)  . Drug Use: No  . Sexual Activity: Yes    Birth Control/ Protection: None   Other Topics Concern  .  Not on file   Social History Narrative   Lives with wife--recently diagnosed with breast ca; No smoking; Occassional wine; Retired; 2 daughters live in [place]--5 grandchildren(boys). On weight watchers. Lives in [place] with wife. Retired Solicitor. Tobacco history 2 ppd x 32 years. No smoking x 25 years. No drugs.    FAMILY HISTORY:   Family Status  Relation Status Death Age  . Mother Deceased     heart disease  . Father Deceased     heart disease, renal failure  . Sister Deceased     MI  . Sister Alive     dementia;   . Child Alive     sjogrens; hashimotos; CAD    ROS:  Some upper and low back pain.  A complete 10 system review of systems was obtained and was unremarkable apart from what is mentioned above.  PHYSICAL EXAMINATION:    VITALS:   Filed Vitals:   06/27/15 0920  BP: 162/84  Pulse: 60  Height: 5' 7"  (1.702 m)  Weight: 210 lb (95.255 kg)    GEN:  Normal appears male in no acute distress.  Appears stated age. HEENT:  Normocephalic, atraumatic. The mucous membranes are moist. The superficial temporal arteries are without ropiness or tenderness. Cardiovascular: Regular rate and rhythm. Lungs: Clear to auscultation bilaterally. Neck/Heme: There are no carotid bruits noted bilaterally.  NEUROLOGICAL: Orientation:   Montreal Cognitive Assessment  06/17/2015  Visuospatial/ Executive (0/5) 5  Naming (0/3) 3  Attention: Read list of digits (0/2) 2  Attention: Read list of letters (0/1) 1  Attention: Serial 7  subtraction starting at 100 (0/3) 3  Language: Repeat phrase (0/2) 2  Language : Fluency (0/1) 1  Abstraction (0/2) 2  Delayed Recall (0/5) 4  Orientation (0/6) 6  Total 29  Adjusted Score (based on education) 29    Cranial nerves: There is good facial symmetry. Marland Kitchen Speech is fluent and clear. Soft palate rises symmetrically and there is no tongue deviation. Hearing is intact to conversational tone. Tone: Tone is good throughout. Sensation: Sensation is intact to light touch throughout. Coordination:  The patient has no difficulty with RAM's or FNF bilaterally. Motor: Strength is 5/5 in the bilateral upper and lower extremities.  Shoulder shrug is equal and symmetric. There is no pronator drift.  There are no fasciculations noted. Gait and Station: The patient is able to ambulate without difficulty.  Lab Results  Component Value Date   HGBA1C 5.4 03/24/2015   Lab Results  Component Value Date   TSH 4.215 03/14/2015      IMPRESSION/PLAN  1. Gait instability  -strong evidence of a diffuse peripheral neuropathy.  He has had almost all labs necessary for a complete PN w/u but will add SPEP/UPEP with immunofix and ESR  -Awaiting EMG  -talked to him extensively about safety with PN.    2.  Asymptommatic carotid stenosis  -Given that there is extensive irregular plaque and that it is over 80%, he will need a vascular surgery consult.  We discussed signs and symptoms of stroke.  He is to call 911 immediately should he have any of the symptoms.  We will put in for the consult.  He will continue on aspirin for now.  Talked to him about the fact that he also has pretty significant vertebral stenosis, but there is likely nothing but medical therapy we can do about that.  3.  Will call him with results of the above.  Much greater than 50%  of this visit was spent in counseling with the patient and the family.  Total face to face time:  30 minutes

## 2015-06-27 NOTE — Patient Instructions (Signed)
We have scheduled you to see Dr Trula Slade on 07/18/2014 at 2:00 pm to arrive at 1:45 pm. They are located at [place] [id number].  If this is not a good date and time you can call [phone] to reschedule.

## 2015-07-03 ENCOUNTER — Other Ambulatory Visit: Payer: Self-pay | Admitting: General Practice

## 2015-07-03 ENCOUNTER — Encounter: Payer: Self-pay | Admitting: Medical

## 2015-07-03 ENCOUNTER — Ambulatory Visit (INDEPENDENT_AMBULATORY_CARE_PROVIDER_SITE_OTHER): Payer: Medicare Other | Admitting: Medical

## 2015-07-03 VITALS — BP 124/82 | HR 88 | Temp 97.8°F | Resp 16 | Ht 67.0 in | Wt 208.8 lb

## 2015-07-03 DIAGNOSIS — R062 Wheezing: Secondary | ICD-10-CM | POA: Diagnosis not present

## 2015-07-03 DIAGNOSIS — I6521 Occlusion and stenosis of right carotid artery: Secondary | ICD-10-CM | POA: Diagnosis not present

## 2015-07-03 DIAGNOSIS — R0981 Nasal congestion: Secondary | ICD-10-CM | POA: Diagnosis not present

## 2015-07-03 DIAGNOSIS — J40 Bronchitis, not specified as acute or chronic: Secondary | ICD-10-CM

## 2015-07-03 DIAGNOSIS — R05 Cough: Secondary | ICD-10-CM | POA: Diagnosis not present

## 2015-07-03 DIAGNOSIS — R059 Cough, unspecified: Secondary | ICD-10-CM

## 2015-07-03 MED ORDER — FLUTICASONE PROPIONATE 50 MCG/ACT NA SUSP: 2.0000 | Freq: Every day | NASAL | Status: DC

## 2015-07-03 MED ORDER — BECLOMETHASONE DIPROPIONATE 40 MCG/ACT IN AERS: 2.0000 | INHALATION_SPRAY | Freq: Two times a day (BID) | RESPIRATORY_TRACT | Status: DC

## 2015-07-03 MED ORDER — HYDROCODONE-ACETAMINOPHEN 5-325 MG PO TABS: 0.5000 | ORAL_TABLET | Freq: Two times a day (BID) | ORAL | Status: DC | PRN

## 2015-07-03 MED ORDER — BENZONATATE 100 MG PO CAPS: 100.0000 mg | ORAL_CAPSULE | Freq: Three times a day (TID) | ORAL | Status: DC | PRN

## 2015-07-03 MED ORDER — ALBUTEROL SULFATE HFA 108 (90 BASE) MCG/ACT IN AERS: 2.0000 | INHALATION_SPRAY | Freq: Four times a day (QID) | RESPIRATORY_TRACT | Status: DC | PRN

## 2015-07-03 MED ORDER — AZITHROMYCIN 250 MG PO TABS: ORAL_TABLET | ORAL | Status: DC

## 2015-07-03 NOTE — Telephone Encounter (Signed)
Last OV 05-30-15 (estalished care) Hydrocodone last filled 04/03/15 #60 with 0 (filled by Dr. Dorcas Mcmurray previous PCP)

## 2015-07-03 NOTE — Patient Instructions (Addendum)
For nasal congestion rx flonase  For cough benzonatate.  For possible worse wheezing. Rx qvar and albuterol.(note pior heavy smoker)  For early bronchitis rx azithromycin. Particularly as approach long christmas weekend.  Follow up in 7 days or as needed

## 2015-07-03 NOTE — Telephone Encounter (Signed)
Med filled and pt informed.

## 2015-07-03 NOTE — Progress Notes (Signed)
Subjective:    Patient ID: [name], male    DOB: 08/26/1933, 81 y.o.   MRN: [id number]  HPI   Pt in for cough for 5 days. Little better today. Pt wife was sick recently. His wife is now better after azithromycin.  Pt has concern that he will be sick over holiday. Hx of bronchitis and pnuemonia in the past but not recently.  Dry cough. No fever, no chills or sweats.  Then states maybe felt warm last night.  Some pnd and runny nose. Some nasal congestion.  Pt tried mucinex otc. Didn not help much last 3 days.  Hx of asthma in past. Also smoked 40 years.     Review of Systems  Constitutional: Negative for fever, chills and fatigue.       Maybe fever. He is not sure.   HENT: Positive for congestion and rhinorrhea. Negative for ear pain, mouth sores, nosebleeds, sinus pressure, sneezing and sore throat.        Borderline maybe st.  Respiratory: Positive for cough. Negative for choking, chest tightness, shortness of breath and wheezing.   Cardiovascular: Negative for chest pain and palpitations.  Gastrointestinal: Negative for abdominal pain.  Musculoskeletal: Negative for back pain.  Neurological: Negative for dizziness and headaches.  Hematological: Negative for adenopathy. Does not bruise/bleed easily.  Psychiatric/Behavioral: Negative for behavioral problems and confusion.    Past Medical History  Diagnosis Date  . Diverticulitis     recurrent  . Tinnitus   . Hearing aid worn   . Kidney stone     lithotripsy [id number] w complications, req stents, Dr Rosana Hoes  . Prostate CA ([facility])   . Complication of anesthesia     " HAD TREMERS AFTER PROSTATE SURGERY"  . Family history of anesthesia complication     MOTHER   . Coronary artery disease   . Anginal pain ([place])   . Hypertension   . Hyperlipemia   . Anxiety     " OCCASIONAL"  . Asthma due to seasonal allergies     uses inhalers prn  . Shortness of breath   . TIA (transient ischemic attack)   . GERD (gastroesophageal  reflux disease)   . H/O hiatal hernia   . Arthritis     Social History   Social History  . Marital Status: Married    Spouse Name: N/A  . Number of Children: N/A  . Years of Education: N/A   Occupational History  . retired     Actor [employer]   Social History Main Topics  . Smoking status: Former Smoker -- 2.50 packs/day for 30 years    Types: Cigarettes    Quit date: 08/28/1985  . Smokeless tobacco: Never Used  . Alcohol Use: 1.2 oz/week    2 Standard drinks or equivalent per week     Comment: 1 glass wine/week (prior 1 glass martini with dinner)  . Drug Use: No  . Sexual Activity: Yes    Birth Control/ Protection: None   Other Topics Concern  . Not on file   Social History Narrative   Lives with wife--recently diagnosed with breast ca; No smoking; Occassional wine; Retired; 2 daughters [place] in [place]--5 grandchildren(boys). On weight watchers. Lives in [place] with wife. Retired Solicitor. Tobacco history 2 ppd x 32 years. No smoking x 25 years. No drugs.    Past Surgical History  Procedure Laterality Date  . Carotid endarterectomy  Left    1998  . Prostatectomy  1998    radical for prostate cancer  . Cardiac catheterization  2008    minimal dz, Dr Cathie Olden  . Cholecystectomy    . Left heart catheterization with coronary angiogram N/A 09/15/2013    Procedure: LEFT HEART CATHETERIZATION WITH CORONARY ANGIOGRAM;  Surgeon: Peter M Martinique, MD;  Location: [facility] CATH LAB;  Service: Cardiovascular;  Laterality: N/A;    No family history on file.  Allergies  Allergen Reactions  . Ciprofloxacin Other (See Comments)    Hallucinations or jittery  . Codeine Other (See Comments)    hallucinations  . Sulfonamide Derivatives Other (See Comments)    Chills and shaking "serum sickness"    Current Outpatient Prescriptions on File Prior to Visit  Medication Sig Dispense Refill  . albuterol (PROVENTIL HFA) 108 (90 BASE) MCG/ACT inhaler Inhale 2  puffs into the lungs every 4 (four) hours as needed for wheezing. 18 g 0  . amLODipine (NORVASC) 2.5 MG tablet Take 1 tablet (2.5 mg total) by mouth daily. 90 tablet 3  . ARMOUR THYROID 90 MG tablet Take 90 mg by mouth every morning.  2  . Artificial Tear Solution (BION TEARS OP) Place 1 drop into both eyes daily as needed (dry eyes).    Marland Kitchen aspirin (BAYER ASPIRIN) 325 MG tablet Take 325 mg by mouth every other day. Alternate with #2 81 mg aspirins    . carvedilol (COREG) 3.125 MG tablet Take 1 tablet (3.125 mg total) by mouth 2 (two) times daily with a meal. 180 tablet 3  . cetirizine (ZYRTEC) 10 MG tablet Take 10 mg by mouth See admin instructions. Take 1 tablet (10 mg) by mouth daily during allergy season    . Cholecalciferol (VITAMIN D) 2000 UNITS tablet Take 2,000 Units by mouth daily with lunch.    . clonazePAM (KLONOPIN) 0.5 MG tablet Take 1 tablet (0.5 mg total) by mouth at bedtime. (Patient taking differently: Take 0.25 mg by mouth at bedtime as needed (when wearing CPAP mask). ) 30 tablet 0  . econazole nitrate 1 % cream Apply topically daily. (Patient taking differently: Apply 1 application topically daily as needed (facial dryness/itching). ) 15 g 5  . FLUZONE HIGH-DOSE 0.5 ML SUSY TO BE ADMINISTERED BY RPH  0  . losartan-hydrochlorothiazide (HYZAAR) 100-12.5 MG per tablet Take 1 tablet by mouth daily. 90 tablet 3  . Melatonin 1 MG/ML LIQD Take by mouth.    . Menaquinone-7 (VITAMIN K2 PO) Take by mouth daily.    . nitroGLYCERIN (NITROLINGUAL) 0.4 MG/SPRAY spray Place 1 spray under the tongue every 5 (five) minutes x 3 doses as needed for chest pain. 12 g 12  . polyethylene glycol (MIRALAX / GLYCOLAX) packet Take 17 g by mouth daily. Mix in 8 oz liquid and drink    . triamcinolone cream (KENALOG) 0.1 % Apply 1 application topically daily as needed (rash).     No current facility-administered medications on file prior to visit.    BP 124/82 mmHg  Pulse 88  Temp(Src) 97.8 F (36.6 C)  (Oral)  Resp 16  Ht 5\' 7"  (1.702 m)  Wt 208 lb 12.8 oz (94.711 kg)  BMI 32.69 kg/m2  SpO2 98%       Objective:   Physical Exam  General  Mental Status - Alert. General Appearance - Well groomed. Not in acute distress.  Skin Rashes- No Rashes.  HEENT Head- Normal. Ear Auditory Canal - Left- Normal. Right - Normal.Tympanic Membrane- Left- Normal. Right- Normal. Eye Sclera/Conjunctiva- Left- Normal. Right-  Normal. Nose & Sinuses Nasal Mucosa- Left-  Boggy and Congested. Right-  Boggy and  Congested.Bilateral  No maxillary and  No frontal sinus pressure. Mouth & Throat Lips: Upper Lip- Normal: no dryness, cracking, pallor, cyanosis, or vesicular eruption. Lower Lip-Normal: no dryness, cracking, pallor, cyanosis or vesicular eruption. Buccal Mucosa- Bilateral- No Aphthous ulcers. Oropharynx- No Discharge or Erythema. +pnd Tonsils: Characteristics- Bilateral- No Erythema or Congestion. Size/Enlargement- Bilateral- No enlargement. Discharge- bilateral-None.  Neck Neck- Supple. No Masses.   Chest and Lung Exam Auscultation: Breath Sounds:-even and unlabored. Mild faint rhonchi. Faint expiratory wheeze  Cardiovascular Auscultation:Rythm- Regular, rate and rhythm. Murmurs & Other Heart Sounds:Ausculatation of the heart reveal- No Murmurs.  Lymphatic Head & Neck General Head & Neck Lymphatics: Bilateral: Description- No Localized lymphadenopathy.       Assessment & Plan:  For nasal congestion rx flonase  For cough benzonatate.  For possible worse wheezing. Rx qvar and albuterol.  For early bronchitis rx azithromycin. Particularly as approach long christmas weekend.  Follow up in 7 days or as needed

## 2015-07-03 NOTE — Progress Notes (Signed)
Pre visit review using our clinic review tool, if applicable. No additional management support is needed unless otherwise documented below in the visit note. 

## 2015-07-11 ENCOUNTER — Encounter: Payer: Self-pay | Admitting: Surgery

## 2015-07-18 ENCOUNTER — Encounter: Payer: Medicare Other | Admitting: Neurology

## 2015-07-19 ENCOUNTER — Encounter: Payer: Medicare Other | Admitting: Surgery

## 2015-07-23 ENCOUNTER — Ambulatory Visit (INDEPENDENT_AMBULATORY_CARE_PROVIDER_SITE_OTHER): Payer: Medicare Other | Admitting: Neurology

## 2015-07-23 DIAGNOSIS — G609 Hereditary and idiopathic neuropathy, unspecified: Secondary | ICD-10-CM

## 2015-07-23 NOTE — Procedures (Signed)
[facility]  [place], [place]  [place], [place] [address] Tel: [phone] Fax:  [phone] Test Date:  07/23/2015  Patient: [name] DOB: 01-23-1934 Physician: Narda Amber, DO  Sex: Male Height: 5\' 7"  Ref Phys: Alonza Bogus, M.D.  ID#: [id number] Temp: 33.7C Technician: Jerilynn Mages. Dean   Patient Complaints: This is a 82 year old gentleman referred for evaluation of gait abnormality and weakness.  NCV & EMG Findings: Extensive electrodiagnostic testing of the left lower extremity and additional studies of the right shows: 1. Bilateral superficial peroneal sensory responses are absent. Bilateral sural sensory responses are within normal limits. 2. Left peroneal motor response shows reduced amplitude at the extensor digitorum brevis, however when recording at the tibialis anterior, responses within normal limits. Bilateral tibial and left peroneal motor responses are within normal limits. 3. Bilateral H reflex studies are within normal limits. 4. Chronic motor axon loss changes are seen affecting the muscles below the knee, without accompanied active denervation.  Impression: The electrophysiologic findings are most consistent with a distal and symmetric sensorimotor polyneuropathy, axon loss in type, affecting the lower extremities. Overall, these findings are moderate in degree electrically.   ___________________________ Narda Amber, DO    Nerve Conduction Studies Anti Sensory Summary Table   Site NR Peak (ms) Norm Peak (ms) P-T Amp (V) Norm P-T Amp  Left Sup Peroneal Anti Sensory (Ant Lat Mall)  33.7C  12 cm NR  <4.6  >3  Right Sup Peroneal Anti Sensory (Ant Lat Mall)  33.7C  12 cm NR  <4.6  >3  Left Sural Anti Sensory (Lat Mall)  33.7C  Calf    4.0 <4.6 7.0 >3  Right Sural Anti Sensory (Lat Mall)  33.7C  Calf    3.8 <4.6 8.3 >3   Motor Summary Table   Site NR Onset (ms) Norm Onset (ms) O-P Amp (mV) Norm O-P Amp Site1 Site2 Delta-0 (ms) Dist (cm)  Vel (m/s) Norm Vel (m/s)  Left Peroneal Motor (Ext Dig Brev)  33.7C  Ankle    4.9 <6.0 1.7 >2.5 B Fib Ankle 7.1 31.0 44 >40  B Fib    12.0  1.6  Poplt B Fib 2.1 10.0 48 >40  Poplt    14.1  1.5         Right Peroneal Motor (Ext Dig Brev)  33.7C  Ankle    4.8 <6.0 2.5 >2.5 B Fib Ankle 6.5 30.0 46 >40  B Fib    11.3  2.3  Poplt B Fib 1.7 10.0 59 >40  Poplt    13.0  2.3         Left Peroneal TA Motor (Tib Ant)  33.7C  Fib Head    2.4 <4.5 3.7 >3 Poplit Fib Head 1.6 9.0 56 >40  Poplit    4.0  3.4         Left Tibial Motor (Abd Hall Brev)  33.7C  Ankle    4.2 <6.0 8.2 >4 Knee Ankle 8.5 38.0 45 >40  Knee    12.7  6.6         Right Tibial Motor (Abd Hall Brev)  33.7C  Ankle    3.4 <6.0 4.4 >4 Knee Ankle 9.1 39.0 43 >40  Knee    12.5  4.3          H Reflex Studies   NR H-Lat (ms) Lat Norm (ms) L-R H-Lat (ms)  Left Tibial (Gastroc)  33.7C     31.97 <35 1.77  Right  Tibial (Gastroc)  33.7C     33.74 <35 1.77   EMG   Side Muscle Ins Act Fibs Psw Fasc Number Recrt Dur Dur. Amp Amp. Poly Poly. Comment  Left AntTibialis Nml Nml Nml Nml 1- Rapid Some 1+ Some 1+ Some 1+ N/A  Left Gastroc Nml Nml Nml Nml 2- Rapid Some 1+ Some 1+ Some 1+ N/A  Left RectFemoris Nml Nml Nml Nml Nml Nml Nml Nml Nml Nml Nml Nml N/A  Left Flex Dig Long Nml Nml Nml Nml 2- Rapid Many 1+ Many 1+ Many 1+ N/A  Left BicepsFemS Nml Nml Nml Nml Nml Nml Nml Nml Nml Nml Nml Nml N/A  Left GluteusMed Nml Nml Nml Nml Nml Nml Nml Nml Nml Nml Nml Nml N/A  Right RectFemoris Nml Nml Nml Nml Nml Nml Nml Nml Nml Nml Nml Nml N/A  Right AntTibialis Nml Nml Nml Nml 1- Rapid Some 1+ Some 1+ Some 1+ N/A  Right Gastroc Nml Nml Nml Nml 1- Rapid Some 1+ Some 1+ Nml Nml N/A      Waveforms:

## 2015-07-24 ENCOUNTER — Telehealth: Payer: Self-pay | Admitting: Neurology

## 2015-07-24 NOTE — Telephone Encounter (Signed)
Patient made aware.

## 2015-07-24 NOTE — Telephone Encounter (Signed)
-----   Message from [place], DO sent at 07/24/2015  9:01 AM EST ----- Let pt know that EMG confirmed PE findings of a moderate PN.

## 2015-08-05 ENCOUNTER — Encounter: Payer: Self-pay | Admitting: Vascular Surgery

## 2015-08-09 ENCOUNTER — Telehealth: Payer: Self-pay | Admitting: Family Medicine

## 2015-08-09 ENCOUNTER — Encounter: Payer: Self-pay | Admitting: Family Medicine

## 2015-08-09 ENCOUNTER — Ambulatory Visit (INDEPENDENT_AMBULATORY_CARE_PROVIDER_SITE_OTHER): Payer: Medicare Other | Admitting: Family Medicine

## 2015-08-09 VITALS — BP 130/70 | HR 68 | Temp 97.5°F | Ht 67.0 in | Wt 212.0 lb

## 2015-08-09 DIAGNOSIS — R42 Dizziness and giddiness: Secondary | ICD-10-CM | POA: Diagnosis not present

## 2015-08-09 LAB — BASIC METABOLIC PANEL
BUN: 19 mg/dL (ref 7–25)
CO2: 31 mmol/L (ref 20–31)
CREATININE: 1.31 mg/dL — AB (ref 0.70–1.18)
Calcium: 8.9 mg/dL (ref 8.6–10.3)
Chloride: 102 mmol/L (ref 98–110)
Glucose, Bld: 95 mg/dL (ref 65–99)
Potassium: 4.4 mmol/L (ref 3.5–5.3)
Sodium: 135 mmol/L (ref 135–146)

## 2015-08-09 LAB — TSH: TSH: 0.143 u[IU]/mL — AB (ref 0.350–4.500)

## 2015-08-09 MED ORDER — ATORVASTATIN CALCIUM 10 MG PO TABS: 10.0000 mg | ORAL_TABLET | Freq: Every day | ORAL | Status: DC

## 2015-08-09 NOTE — Telephone Encounter (Signed)
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 If symptomatic, describe symptoms. You must click the next button to save text entered. ---Caller states that last night, he experienced 2 episodes of vertigo last night. They didn't last very long. He has a history of TIA's. 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 Denies episodes at this time. Denies weakness and numbness at present. Denies ear pain.  Has the patient traveled out of the country within the last 30 days? ---No  Does the patient have any new or worsening symptoms? ---Yes  Will a triage be completed? ---Yes  Related visit to physician within the last 2 weeks? ---No  Does the PT have any chronic conditions? (i.e. diabetes, asthma, etc.) ---Yes  List chronic conditions. ---HTN,  Is this a behavioral health or substance abuse call? ---No     Guidelines    Guideline Title Affirmed Question Affirmed Notes  Dizziness - Vertigo [1] MODERATE dizziness (e.g., vertigo; feels very unsteady, interferes with normal activities) AND [2] has NOT been evaluated by physician for this    Final Disposition User   See Physician within [place], RN, Alveta Heimlich    Comments  xfer from [facility] OFFICE   Disagree/Comply: Leta Baptist

## 2015-08-09 NOTE — Telephone Encounter (Signed)
Called to follow up with patient. Pt states he's doing okay today. He just feels weak and lightheaded (but says this is not new).  He has not had any episodes of vertigo today.  Wife will be driving him to his appt today at 3:30 pm.     Routed to PCP for FYI.

## 2015-08-09 NOTE — Telephone Encounter (Signed)
Pt called in stating that he experience some vertigo last night. Scheduled pt to come into the office to see PCP today at 3:30 . ALSO, transferred pt to [facility] to speak with a nurse .

## 2015-08-09 NOTE — Progress Notes (Signed)
Pre visit review using our clinic review tool, if applicable. No additional management support is needed unless otherwise documented below in the visit note. 

## 2015-08-09 NOTE — Telephone Encounter (Signed)
Patient being seen today by Dr. Birdie Riddle.

## 2015-08-09 NOTE — Progress Notes (Signed)
   Subjective:    Patient ID: [name], male    DOB: 06-12-1934, 82 y.o.   MRN: [id number]  HPI Dizziness- pt reports he had vertigo last night, first time ever.  Pt was standing in the kitchen when 'things developed soft edges and then everything tipped on its side'.  sxs passed within 15-20 seconds.  30 minutes later when sitting in his chair he felt 'totally disconnected from everything and the room tipped again'.  Lasted 20 seconds.  Pt has hx of TIAs.  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   Pt feels light headed but this has been ongoing x6 months.  Pt also has balance issues but this is not new.  Pt is on ASA 325mg  daily.  Pt stopped statin in September- reports hospital told him to.  On beta blocker.   Review of Systems For ROS see HPI     Objective:   Physical Exam  Constitutional: He is oriented to person, place, and time. He appears well-developed and well-nourished. No distress.  HENT:  Head: Normocephalic and atraumatic.  Right Ear: External ear normal.  Left Ear: External ear normal.  Nose: Nose normal.  Mouth/Throat: Oropharynx is clear and moist. No oropharyngeal exudate.  Eyes: Conjunctivae and EOM are normal. Pupils are equal, round, and reactive to light.  Very mild horizontal nystagmus  Neck: Normal range of motion. Neck supple. No thyromegaly present.  Cardiovascular: Normal rate, regular rhythm, normal heart sounds and intact distal pulses.   Pulmonary/Chest: Effort normal and breath sounds normal. No respiratory distress. He has no wheezes. He has no rales.  Lymphadenopathy:    He has no cervical adenopathy.  Neurological: He is alert and oriented to person, place, and time. He has normal reflexes. No cranial nerve deficit. Coordination (shuffling gait) abnormal.  Skin: Skin is warm and dry.  Psychiatric: He has a normal mood and affect. His behavior is normal.  Vitals reviewed.         Assessment & Plan:

## 2015-08-09 NOTE — Patient Instructions (Signed)
Follow up w/ Dr Scot Dock as scheduled [facility] notify you of your lab results and make any changes if needed Drink plenty of fluids- this can help vertigo If you have another episode that doesn't improve almost immediately- call 911 Call with any questions or concerns Hang in there! Happy Early Rudene Anda!!!

## 2015-08-10 LAB — CBC WITH DIFFERENTIAL/PLATELET
BASOS ABS: 0.1 10*3/uL (ref 0.0–0.1)
BASOS PCT: 2 % — AB (ref 0–1)
EOS ABS: 0.6 10*3/uL (ref 0.0–0.7)
EOS PCT: 10 % — AB (ref 0–5)
HCT: 40.4 % (ref 39.0–52.0)
Hemoglobin: 14 g/dL (ref 13.0–17.0)
Lymphocytes Relative: 22 % (ref 12–46)
Lymphs Abs: 1.2 10*3/uL (ref 0.7–4.0)
MCH: 33.9 pg (ref 26.0–34.0)
MCHC: 34.7 g/dL (ref 30.0–36.0)
MCV: 97.8 fL (ref 78.0–100.0)
MONO ABS: 0.5 10*3/uL (ref 0.1–1.0)
MPV: 10.9 fL (ref 8.6–12.4)
Monocytes Relative: 9 % (ref 3–12)
Neutro Abs: 3.1 10*3/uL (ref 1.7–7.7)
Neutrophils Relative %: 57 % (ref 43–77)
PLATELETS: 123 10*3/uL — AB (ref 150–400)
RBC: 4.13 MIL/uL — AB (ref 4.22–5.81)
RDW: 14.8 % (ref 11.5–15.5)
WBC: 5.5 10*3/uL (ref 4.0–10.5)

## 2015-08-11 NOTE — Assessment & Plan Note (Signed)
New.  Pt is currently asymptomatic but had 2 very distinct episodes yesterday.  Unclear if these were vertigo episodes or TIAs (pt has hx of this).  Pt has appt upcoming w/ vascular next week.  Already on full dose ASA, beta blocker.  Will restart statin for anti-inflammatory properties.  Check labs.  Pt to go directly to ER if he has another episode that doesn't resolve immediately.  Pt expressed understanding and is in agreement w/ plan.

## 2015-08-12 ENCOUNTER — Other Ambulatory Visit: Payer: Self-pay | Admitting: General Practice

## 2015-08-12 DIAGNOSIS — E039 Hypothyroidism, unspecified: Secondary | ICD-10-CM

## 2015-08-12 MED ORDER — THYROID 60 MG PO TABS: 60.0000 mg | ORAL_TABLET | Freq: Every day | ORAL | Status: DC

## 2015-08-13 ENCOUNTER — Encounter: Payer: Medicare Other | Admitting: Neurology

## 2015-08-14 ENCOUNTER — Encounter: Payer: Self-pay | Admitting: Vascular Surgery

## 2015-08-14 ENCOUNTER — Ambulatory Visit (INDEPENDENT_AMBULATORY_CARE_PROVIDER_SITE_OTHER): Payer: Medicare Other | Admitting: Vascular Surgery

## 2015-08-14 VITALS — BP 142/72 | HR 60 | Temp 97.6°F | Resp 16 | Ht 67.0 in | Wt 209.0 lb

## 2015-08-14 DIAGNOSIS — I6521 Occlusion and stenosis of right carotid artery: Secondary | ICD-10-CM | POA: Diagnosis not present

## 2015-08-14 NOTE — Progress Notes (Signed)
Filed Vitals:   08/14/15 1325 08/14/15 1327 08/14/15 1335 08/14/15 1336  BP: 156/83 140/80 144/79 142/72  Pulse: 63 60 61 60  Temp:  97.6 F (36.4 C)    Resp:  16    Height:  5\' 7"  (1.702 m)    Weight:  209 lb (94.802 kg)    SpO2:  96%

## 2015-08-14 NOTE — Progress Notes (Signed)
[facility] [place]  Patient name: [name] MRN: [id number] DOB: 1933-09-16 Sex: male  REASON FOR CONSULT: Right carotid stenosis. Referred by Dr. Carles Collet.   HPI: [name] is a 82 y.o. male, who is status post previous left carotid endarterectomy in 1998. He was having some problems with unsteady gait which prompted a duplex scan and he was found to have a 60-79% right carotid stenosis with no evidence of recurrent stenosis on the left. Subsequent CT scan of the neck suggested an 80% right carotid stenosis and he is sent for vascular consultation.  On my history, he denies any history of stroke, TIAs, expressive or receptive aphasia, or amaurosis fugax. He does describe some lightheadedness at times but no real dizziness.  He does have a history of stable angina.  He is on aspirin and is on a statin.  Past Medical History  Diagnosis Date  . Diverticulitis     recurrent  . Tinnitus   . Hearing aid worn   . Kidney stone     lithotripsy [id number] w complications, req stents, Dr Rosana Hoes  . Prostate CA ([place])   . Complication of anesthesia     " HAD TREMERS AFTER PROSTATE SURGERY"  . Family history of anesthesia complication     MOTHER   . Coronary artery disease   . Anginal pain ([place])   . Hypertension   . Hyperlipemia   . Anxiety     " OCCASIONAL"  . Asthma due to seasonal allergies     uses inhalers prn  . Shortness of breath   . TIA (transient ischemic attack)   . GERD (gastroesophageal reflux disease)   . H/O hiatal hernia   . Arthritis   . Carotid artery occlusion     Left    History reviewed. No pertinent family history.  SOCIAL HISTORY: Social History   Social History  . Marital Status: Married    Spouse Name: N/A  . Number of Children: N/A  . Years of Education: N/A   Occupational History  . retired     Actor [employer]   Social History Main Topics  . Smoking status: Former Smoker -- 2.50 packs/day for 30 years   Types: Cigarettes    Quit date: 08/28/1985  . Smokeless tobacco: Never Used  . Alcohol Use: 1.2 oz/week    2 Standard drinks or equivalent per week     Comment: 1 glass wine/week (prior 1 glass martini with dinner)  . Drug Use: No  . Sexual Activity: Yes    Birth Control/ Protection: None   Other Topics Concern  . Not on file   Social History Narrative   Lives with wife--recently diagnosed with breast ca; No smoking; Occassional wine; Retired; 2 daughters live in [place]--5 grandchildren(boys). On weight watchers. Lives in [place] with wife. Retired Solicitor. Tobacco history 2 ppd x 32 years. No smoking x 25 years. No drugs.    Allergies  Allergen Reactions  . Ciprofloxacin Other (See Comments)    Hallucinations or jittery  . Codeine Other (See Comments)    hallucinations  . Sulfonamide Derivatives Other (See Comments)    Chills and shaking "serum sickness"  . Flagyl [Metronidazole] Rash    Current Outpatient Prescriptions  Medication Sig Dispense Refill  . albuterol (PROVENTIL HFA) 108 (90 BASE) MCG/ACT inhaler Inhale 2 puffs into the lungs every 4 (four) hours as needed for wheezing. 18 g 0  . albuterol (PROVENTIL HFA;VENTOLIN HFA)  108 (90 BASE) MCG/ACT inhaler Inhale 2 puffs into the lungs every 6 (six) hours as needed for wheezing or shortness of breath. 1 Inhaler 0  . amLODipine (NORVASC) 2.5 MG tablet Take 1 tablet (2.5 mg total) by mouth daily. 90 tablet 3  . Artificial Tear Solution (BION TEARS OP) Place 1 drop into both eyes daily as needed (dry eyes).    Marland Kitchen aspirin (BAYER ASPIRIN) 325 MG tablet Take 325 mg by mouth every other day. Alternate with #2 81 mg aspirins    . atorvastatin (LIPITOR) 10 MG tablet Take 1 tablet (10 mg total) by mouth daily. 30 tablet 3  . beclomethasone (QVAR) 40 MCG/ACT inhaler Inhale 2 puffs into the lungs 2 (two) times daily. 1 Inhaler 1  . benzonatate (TESSALON) 100 MG capsule Take 1 capsule (100 mg total) by mouth 3 (three)  times daily as needed. 21 capsule 0  . carvedilol (COREG) 3.125 MG tablet Take 1 tablet (3.125 mg total) by mouth 2 (two) times daily with a meal. 180 tablet 3  . cetirizine (ZYRTEC) 10 MG tablet Take 10 mg by mouth See admin instructions. Take 1 tablet (10 mg) by mouth daily during allergy season    . Cholecalciferol (VITAMIN D) 2000 UNITS tablet Take 2,000 Units by mouth daily with lunch.    . clonazePAM (KLONOPIN) 0.5 MG tablet Take 1 tablet (0.5 mg total) by mouth at bedtime. (Patient taking differently: Take 0.25 mg by mouth at bedtime as needed (when wearing CPAP mask). ) 30 tablet 0  . econazole nitrate 1 % cream Apply topically daily. (Patient taking differently: Apply 1 application topically daily as needed (facial dryness/itching). ) 15 g 5  . fluticasone (FLONASE) 50 MCG/ACT nasal spray Place 2 sprays into both nostrils daily. 16 g 1  . FLUZONE HIGH-DOSE 0.5 ML SUSY TO BE ADMINISTERED BY RPH  0  . HYDROcodone-acetaminophen (NORCO/VICODIN) 5-325 MG tablet Take 0.5-1 tablets by mouth 2 (two) times daily as needed (pain). (Patient taking differently: Take 0.5-1 tablets by mouth as needed (pain). ) 60 tablet 0  . losartan-hydrochlorothiazide (HYZAAR) 100-12.5 MG per tablet Take 1 tablet by mouth daily. 90 tablet 3  . Melatonin 1 MG/ML LIQD Take by mouth.    . Menaquinone-7 (VITAMIN K2 PO) Take by mouth daily.    . nitroGLYCERIN (NITROLINGUAL) 0.4 MG/SPRAY spray Place 1 spray under the tongue every 5 (five) minutes x 3 doses as needed for chest pain. 12 g 12  . polyethylene glycol (MIRALAX / GLYCOLAX) packet Take 17 g by mouth daily. Mix in 8 oz liquid and drink    . thyroid (ARMOUR) 60 MG tablet Take 1 tablet (60 mg total) by mouth daily before breakfast. 30 tablet 6  . triamcinolone cream (KENALOG) 0.1 % Apply 1 application topically daily as needed (rash).     No current facility-administered medications for this visit.    REVIEW OF SYSTEMS:  [X]  denotes positive finding, [ ]  denotes  negative finding Cardiac  Comments:  Chest pain or chest pressure: X   Shortness of breath upon exertion: X   Short of breath when lying flat:    Irregular heart rhythm:        Vascular    Pain in calf, thigh, or hip brought on by ambulation:    Pain in feet at night that wakes you up from your sleep:     Blood clot in your veins:    Leg swelling:         Pulmonary  Oxygen at home:    Productive cough:     Wheezing:         Neurologic    Sudden weakness in arms or legs:     Sudden numbness in arms or legs:     Sudden onset of difficulty speaking or slurred speech:    Temporary loss of vision in one eye:     Problems with dizziness:         Gastrointestinal    Blood in stool:     Vomited blood:         Genitourinary    Burning when urinating:     Blood in urine:        Psychiatric    Major depression:         Hematologic    Bleeding problems:    Problems with blood clotting too easily:        Skin    Rashes or ulcers:        Constitutional    Fever or chills:      PHYSICAL EXAM: Filed Vitals:   08/14/15 1325 08/14/15 1327 08/14/15 1335 08/14/15 1336  BP: 156/83 140/80 144/79 142/72  Pulse: 63 60 61 60  Temp:  97.6 F (36.4 C)    Resp:  16    Height:  5\' 7"  (1.702 m)    Weight:  209 lb (94.802 kg)    SpO2:  96%      GENERAL: The patient is a well-nourished male, in no acute distress. The vital signs are documented above. CARDIAC: There is a regular rate and rhythm.  VASCULAR: he has a right carotid bruit. Both feet are warm and well-perfused. He has no significant lower extremity swelling. PULMONARY: There is good air exchange bilaterally without wheezing or rales. ABDOMEN: Soft and non-tender with normal pitched bowel sounds.  MUSCULOSKELETAL: There are no major deformities or cyanosis. NEUROLOGIC: No focal weakness or paresthesias are detected. SKIN: There are no ulcers or rashes noted. PSYCHIATRIC: The patient has a normal affect.  DATA:    CAROTID DUPLEX: I have reviewed the carotid duplex scan that was done on 06/18/2015. The patient was noted to have a high bifurcation bilaterally. On the right side there was a 60-79% internal carotid artery stenosis. On the left side with less than 39% stenosis.  CT ANGIO NECK: I have reviewed the CT scan of the neck. This was interpreted as showing an 80% proximal ICA stenosis. There was also a 70% or greater stenosis of the right vertebral artery at its origin from the subclavian artery. Also a 70% stenosis of the left vertebral artery at its origin from the arch.  MEDICAL ISSUES:  RIGHT CAROTID STENOSIS: By duplex, the patient has an asymptomatic 60-79% right carotid stenosis. Given the calcific disease, I think the CT scan is less reliable in predicting the severity of the stenosis. For this reason, I would not recommend right carotid endarterectomy unless the duplex suggested progression of the disease to greater than 80%. He does have a high bifurcation bilaterally which further increases his risk for surgery. I have ordered a follow up carotid duplex scan in 6 months and I'll see him back at that time. He is on aspirin and is on a statin.  He has had some lightheadedness which could potentially be related to his vertebral artery disease although he is convinced that this is from an inner ear problem. If his workup is otherwise unremarkable consideration could be given to cerebral arteriography and  possible vertebral angioplasty by Dr. Estanislado Pandy.   HYPERTENSION: The patient's initial blood pressure today was elevated. We repeated this and this was still elevated. We have encouraged the patient to follow up with their primary care physician for management of their blood pressure.   Deitra Mayo [facility] [place]: [phone]

## 2015-08-14 NOTE — Addendum Note (Signed)
Addended by: Reola Calkins on: 08/14/2015 03:46 PM   Modules accepted: Orders

## 2015-08-27 ENCOUNTER — Other Ambulatory Visit: Payer: Self-pay | Admitting: Family Medicine

## 2015-08-28 NOTE — Telephone Encounter (Signed)
Medication filled to pharmacy as requested.   

## 2015-08-30 ENCOUNTER — Encounter: Payer: Self-pay | Admitting: Family Medicine

## 2015-08-30 ENCOUNTER — Ambulatory Visit (INDEPENDENT_AMBULATORY_CARE_PROVIDER_SITE_OTHER): Payer: Medicare Other | Admitting: Family Medicine

## 2015-08-30 VITALS — BP 132/76 | HR 73 | Temp 97.8°F | Ht 67.0 in | Wt 213.0 lb

## 2015-08-30 DIAGNOSIS — H6123 Impacted cerumen, bilateral: Secondary | ICD-10-CM

## 2015-08-30 DIAGNOSIS — H6120 Impacted cerumen, unspecified ear: Secondary | ICD-10-CM | POA: Insufficient documentation

## 2015-08-30 NOTE — Assessment & Plan Note (Addendum)
New.  Bilateral.  Some success w/ curette but remainder needed to be irrigated.  Pt tolerated irrigation w/o difficulty.

## 2015-08-30 NOTE — Progress Notes (Signed)
Pre visit review using our clinic review tool, if applicable. No additional management support is needed unless otherwise documented below in the visit note. 

## 2015-08-30 NOTE — Progress Notes (Signed)
   Subjective:    Patient ID: [name], male    DOB: 28-Mar-1934, 82 y.o.   MRN: [id number]  HPI Pt was told at time of hearing aid fitting that he needed to have the wax removed before this could be done.  Pt here today for cerumen removal.   Review of Systems     Objective:   Physical Exam  Constitutional: He appears well-developed and well-nourished. No distress.  HENT:  Bilateral cerumen impaction.  Some wax was removed w/ curette but the remaining wax is too close to TM for removal.  Ears irrigated bilaterally.  Vitals reviewed.         Assessment & Plan:

## 2015-09-24 ENCOUNTER — Telehealth: Payer: Self-pay

## 2015-09-25 ENCOUNTER — Encounter: Payer: Self-pay | Admitting: Family Medicine

## 2015-09-25 ENCOUNTER — Ambulatory Visit (INDEPENDENT_AMBULATORY_CARE_PROVIDER_SITE_OTHER): Payer: Medicare Other | Admitting: Family Medicine

## 2015-09-25 VITALS — BP 152/74 | HR 60 | Ht 66.0 in | Wt 216.6 lb

## 2015-09-25 DIAGNOSIS — G4720 Circadian rhythm sleep disorder, unspecified type: Secondary | ICD-10-CM

## 2015-09-25 DIAGNOSIS — I209 Angina pectoris, unspecified: Secondary | ICD-10-CM

## 2015-09-25 DIAGNOSIS — G479 Sleep disorder, unspecified: Secondary | ICD-10-CM

## 2015-09-25 DIAGNOSIS — Z23 Encounter for immunization: Secondary | ICD-10-CM

## 2015-09-25 DIAGNOSIS — E038 Other specified hypothyroidism: Secondary | ICD-10-CM | POA: Diagnosis not present

## 2015-09-25 DIAGNOSIS — Z0000 Encounter for general adult medical examination without abnormal findings: Secondary | ICD-10-CM

## 2015-09-25 DIAGNOSIS — E034 Atrophy of thyroid (acquired): Secondary | ICD-10-CM | POA: Diagnosis not present

## 2015-09-25 LAB — TSH: TSH: 1.12 u[IU]/mL (ref 0.35–4.50)

## 2015-09-25 NOTE — Patient Instructions (Addendum)
We will check your thyroid level today.  If you are not in the normal range we may need to adjust your thyroid dosage.  We are going to send you back to see cardiology for possibly worsening cardiac disease.   For the time being do not increase your exercise routine.  I will arrange a cardiology appt for you- let us know if you do not hear about this soon!   It was very nice to see you today- take care!

## 2015-09-25 NOTE — Progress Notes (Signed)
Media at [facility] [address] [place], [place] [address] [phone]  Date:  09/25/2015   Name:  [name]   DOB:  09/01/1933   MRN:  [id number]  PCP:  [name], MD    Chief Complaint: Annual Exam and Insomnia   History of Present Illness:  [name] is a 82 y.o. very pleasant male patient who presents with the following:  Here today for his [facility] physical exam. He is generally a pt of Dr. Birdie Riddle but plans to transfer to me as Dr. Birdie Riddle is moving to a new office out of town  He is a former smoker Last labs January of this year.    He had CP in 2015, cath revealed 70% stenosis of the RCA. He it is being treated medically. He uses nitro as needed for stable angina- however admits that his angina sx are worsening as of the last 6- 12 months. He is no longer able to ride his bike as he was doing and will use the nitro after "walking to my shed and back."  He has gained some weight since he is not riding the bike as much.   He sees Dr. Carles Collet for a peripheral neuropathy, balance, dizziness, memory concerns.   He also does have carotid stenosis- recent vascular surgery consult. At this time they are following a 60- 79% right carotid steosis and plan for a repeat doppler in about 6 months.  Also history of HTN, hypothyroidism.  He has not had a prevnar 13 as of yet.  Last pneumovax a year ago  He notes a lot of abd bloating for which he is seeing Dr. Cristina Gong. "Donnald Garre been told it's gas."   Concern about insomnia/ change in sleep pattern over the last year:  He notes that he will go to sleep around 11, get up 3-4 hours later and feel wide awake. He would be up for a couple of hours and do a few things, then go back to bed from 5- 10 am. He then will take a nap in the afternoon.  He is frustrated that his sleep pattern has changed over the last year and he would like to try and get on a more standard sleep pattern  He has a  doctor in [place] for his thyroid- he sees this person once a year face to face.  He is seeing this particular doctor because he was willing to give him armour. He just changed to armour last fall.  Apparently this same MD told him something about how his sleep issues are due to an adrenal problem and they may further evaluate this with a saliva cortisol test of some sort   He did have a prostatectomy in 1998  Patient Active Problem List   Diagnosis Date Noted  . Cerumen impaction 08/30/2015  . Dizziness 08/09/2015  . Gait instability 05/30/2015  . Myalgia   . Weakness   . Bloating   . Chest pain at rest- 03/24/2015  . Atypical chest pain 03/24/2015  . Fatigue 03/14/2015  . Hypothyroidism 10/26/2013  . OSA (obstructive sleep apnea) 10/18/2013  . Well adult health check 01/02/2013  . Other symptoms involving urinary system(788.99) 12/21/2012  . Benign localized hyperplasia of prostate with urinary obstruction and other lower urinary tract symptoms (LUTS)(600.21) 12/21/2012  . Carcinoma in situ of prostate 12/21/2012  . Left shoulder pain 08/08/2012  . Obesity 07/24/2011  . Generalized anxiety disorder  07/24/2011  . DIVERTICULOSIS OF COLON 12/25/2009  . DERMATITIS, SEBORRHEIC 12/25/2009  . 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 Memory loss 08/14/2009  . BACK PAIN, LUMBAR 07/19/2009  . PROSTATE CANCER 09/09/2006  . HYPERLIPIDEMIA 09/09/2006  . HYPERTENSION, BENIGN SYSTEMIC 09/09/2006  . CORONARY, ARTERIOSCLEROSIS 09/09/2006  . VENOUS INSUFFICIENCY, CHRONIC 09/09/2006  . GASTROESOPHAGEAL REFLUX, NO ESOPHAGITIS 09/09/2006  . HERNIA, HIATAL, NONCONGENITAL 09/09/2006  . INSOMNIA NOS 09/09/2006    Past Medical History  Diagnosis Date  . Diverticulitis     recurrent  . Tinnitus   . Hearing aid worn   . Kidney stone     lithotripsy [id number] w complications, req stents, Dr Rosana Hoes  . Prostate CA ([place])   . Complication of anesthesia     " HAD TREMERS AFTER PROSTATE SURGERY"  .  Family history of anesthesia complication     MOTHER   . Coronary artery disease   . Anginal pain ([place])   . Hypertension   . Hyperlipemia   . Anxiety     " OCCASIONAL"  . Asthma due to seasonal allergies     uses inhalers prn  . Shortness of breath   . TIA (transient ischemic attack)   . GERD (gastroesophageal reflux disease)   . H/O hiatal hernia   . Arthritis   . Carotid artery occlusion     Left    Past Surgical History  Procedure Laterality Date  . Carotid endarterectomy  Left    1998  . Prostatectomy  1998    radical for prostate cancer  . Cardiac catheterization  2008    minimal dz, Dr Cathie Olden  . Cholecystectomy    . Left heart catheterization with coronary angiogram N/A 09/15/2013    Procedure: LEFT HEART CATHETERIZATION WITH CORONARY ANGIOGRAM;  Surgeon: Peter M Martinique, MD;  Location: [facility] CATH LAB;  Service: Cardiovascular;  Laterality: N/A;    Social History  Substance Use Topics  . Smoking status: Former Smoker -- 2.50 packs/day for 30 years    Types: Cigarettes    Quit date: 08/28/1985  . Smokeless tobacco: Never Used  . Alcohol Use: 1.2 oz/week    2 Standard drinks or equivalent per week     Comment: 1 glass wine/week (prior 1 glass martini with dinner)    No family history on file.  Allergies  Allergen Reactions  . Ciprofloxacin Other (See Comments)    Hallucinations or jittery  . Codeine Other (See Comments)    hallucinations  . Flexeril [Cyclobenzaprine]   . Methocarbamol   . Sulfonamide Derivatives Other (See Comments)    Chills and shaking "serum sickness"  . Flagyl [Metronidazole] Rash    Medication list has been reviewed and updated.  Current Outpatient Prescriptions on File Prior to Visit  Medication Sig Dispense Refill  . albuterol (PROVENTIL HFA) 108 (90 BASE) MCG/ACT inhaler Inhale 2 puffs into the lungs every 4 (four) hours as needed for wheezing. 18 g 0  . Artificial Tear Solution (BION TEARS OP) Place 1 drop into both eyes daily as  needed (dry eyes).    Marland Kitchen aspirin (BAYER ASPIRIN) 325 MG tablet Take 325 mg by mouth every other day. Alternate with #2 81 mg aspirins    . carvedilol (COREG) 3.125 MG tablet TAKE 1 TABLET (3.125 MG TOTAL) BY MOUTH 2 (TWO) TIMES DAILY WITH A MEAL. 180 tablet 1  . cetirizine (ZYRTEC) 10 MG tablet Take 10 mg by mouth See admin instructions. Take 1 tablet (10 mg) by mouth daily during allergy  season    . Cholecalciferol (VITAMIN D) 2000 UNITS tablet Take 2,000 Units by mouth daily with lunch.    Marland Kitchen econazole nitrate 1 % cream Apply topically daily. (Patient taking differently: Apply 1 application topically daily as needed (facial dryness/itching). ) 15 g 5  . losartan-hydrochlorothiazide (HYZAAR) 100-12.5 MG per tablet Take 1 tablet by mouth daily. 90 tablet 3  . Menaquinone-7 (VITAMIN K2 PO) Take by mouth daily.    . nitroGLYCERIN (NITROLINGUAL) 0.4 MG/SPRAY spray Place 1 spray under the tongue every 5 (five) minutes x 3 doses as needed for chest pain. 12 g 12  . polyethylene glycol (MIRALAX / GLYCOLAX) packet Take 17 g by mouth daily. Mix in 8 oz liquid and drink    . thyroid (ARMOUR) 60 MG tablet Take 1 tablet (60 mg total) by mouth daily before breakfast. 30 tablet 6  . triamcinolone cream (KENALOG) 0.1 % Apply 1 application topically daily as needed (rash).    Marland Kitchen HYDROcodone-acetaminophen (NORCO/VICODIN) 5-325 MG tablet Take 0.5-1 tablets by mouth 2 (two) times daily as needed (pain). (Patient not taking: Reported on 09/25/2015) 60 tablet 0   No current facility-administered medications on file prior to visit.    Review of Systems:  As per HPI- otherwise negative. He has been married to his wife of 4 years.  They have 11 grandchildren from their 2 daughters- one of their daughters has 8 kids!   He is a retired Chief Financial Officer.    Physical Examination: Filed Vitals:   09/25/15 0929  BP: 152/74  Pulse: 60   Filed Vitals:   09/25/15 0929  Height: 5\' 6"  (1.676 m)  Weight: 216 lb 9.6 oz (98.249  kg)   Body mass index is 34.98 kg/(m^2). Ideal Body Weight: Weight in (lb) to have BMI = 25: 154.6  GEN: WDWN, NAD, Non-toxic, A & O x 3, overweight, chronically dry skin on his face, HOH, looks well HEENT: Atraumatic, Normocephalic. Neck supple. No masses, No LAD.  Oropharynx wnl, PEERL Ears and Nose: No external deformity. CV: RRR, No M/G/R. No JVD. No thrill. No extra heart sounds. PULM: CTA B, no wheezes, crackles, rhonchi. No retractions. No resp. distress. No accessory muscle use. ABD: S, NT, ND, +BS. No rebound. No HSM. EXTR: No c/c/e NEURO Normal gait.  PSYCH: Normally interactive. Conversant. Not depressed or anxious appearing.  Calm demeanor.   BP Readings from Last 3 Encounters:  09/25/15 152/74  08/30/15 132/76  08/14/15 142/72    Assessment and Plan: Physical exam  Immunization due - Plan: Pneumococcal conjugate vaccine 13-valent IM  Hypothyroidism due to acquired atrophy of thyroid - Plan: TSH  Sleep pattern disturbance  Angina pectoris ([place])  Prevnar today Check TSH- discussed thyroid care with pt brielfy I am concerned about what sounds like worsening angina Discussed sleep disturbance- suggested that he not sleep so late into the morning.   He may also want to curb the afternoon naps to improve his nighttime sleep   Signed Lamar Blinks, MD

## 2015-09-26 NOTE — Telephone Encounter (Signed)
Spoke to patient received a message from [place] to schedule you appointment ASAP.Stated he saw PCP yesterday and told her he has been having angina when he walks up deck stairs or walks across lawn.Advised patient not to walk across lawn and do not walk up deck steps.Stated he takes NTG x 1 with relief.Patient was offered appointment 09/27/15 but declined.Stated he wants to wait and see Dr.Jordan next week.Appointment scheduled with Dr.Jordan 10/02/15 at 4:30 pm.Advised to go to ER if chest pain worsens.

## 2015-09-27 ENCOUNTER — Encounter: Payer: Self-pay | Admitting: *Deleted

## 2015-10-02 ENCOUNTER — Encounter: Payer: Self-pay | Admitting: Cardiology

## 2015-10-02 ENCOUNTER — Ambulatory Visit (INDEPENDENT_AMBULATORY_CARE_PROVIDER_SITE_OTHER): Payer: Medicare Other | Admitting: Cardiology

## 2015-10-02 ENCOUNTER — Other Ambulatory Visit: Payer: Self-pay | Admitting: Cardiology

## 2015-10-02 VITALS — BP 112/64 | HR 54 | Ht 66.0 in | Wt 213.6 lb

## 2015-10-02 DIAGNOSIS — I2510 Atherosclerotic heart disease of native coronary artery without angina pectoris: Secondary | ICD-10-CM | POA: Insufficient documentation

## 2015-10-02 DIAGNOSIS — I6529 Occlusion and stenosis of unspecified carotid artery: Secondary | ICD-10-CM | POA: Insufficient documentation

## 2015-10-02 DIAGNOSIS — I25118 Atherosclerotic heart disease of native coronary artery with other forms of angina pectoris: Secondary | ICD-10-CM | POA: Diagnosis not present

## 2015-10-02 DIAGNOSIS — I2583 Coronary atherosclerosis due to lipid rich plaque: Secondary | ICD-10-CM

## 2015-10-02 DIAGNOSIS — E785 Hyperlipidemia, unspecified: Secondary | ICD-10-CM

## 2015-10-02 DIAGNOSIS — I6523 Occlusion and stenosis of bilateral carotid arteries: Secondary | ICD-10-CM | POA: Diagnosis not present

## 2015-10-02 DIAGNOSIS — I10 Essential (primary) hypertension: Secondary | ICD-10-CM

## 2015-10-02 NOTE — Progress Notes (Signed)
[name] Date of Birth: [birth year] Medical Record [id number]  History of Present Illness: Mr.[name] is seen at the request of Dr. Lorelei Pont for evaluation of increasing angina. He has a known history of CAD from evaluation in 2015. He was  hospitalized with atypical chest pain. He ruled out for MI. Cardiac cath was done and demonstrated a 70% stenosis of the proximal RCA. FFR was 0.82. It was felt that this lesion was not flow limiting and he was treated medically. No other significant CAD. Coreg was started at a low dose. On follow up in April 2015 he denied any symptoms. He has not followed up with me since. More recently he has noted symptoms of chest pain described as a tightness and ache in his mid chest associated with SOB. This is related to exertion. It is relieved with SL Ntg. Symptoms have progressed at are occuring at lower levels of exertion and more frequent. This is limiting. He is interested in starting an exercise program since he has gained 25 lbs in the last 2 years.  He was admitted in September 2016 with generalized weakness and arthralgias. This was felt to be related to statins and this was stopped. More recently a statin was resumed and he note a return of these symptoms. He does complain of increased fatigue. He is on thyroid replacement now. He is followed by Dr. Scot Dock for carotid disease and is s/p left CEA. Evaluation in Dec 2016 showed an 80% right ICA stenosis. This is being followed closely.   Current Outpatient Prescriptions on File Prior to Visit  Medication Sig Dispense Refill  . albuterol (PROVENTIL HFA) 108 (90 BASE) MCG/ACT inhaler Inhale 2 puffs into the lungs every 4 (four) hours as needed for wheezing. 18 g 0  . Artificial Tear Solution (BION TEARS OP) Place 1 drop into both eyes daily as needed (dry eyes).    Marland Kitchen aspirin (BAYER ASPIRIN) 325 MG tablet Take 325 mg by mouth every other day. Alternate with #2 81 mg aspirins    . carvedilol (COREG) 3.125 MG tablet  TAKE 1 TABLET (3.125 MG TOTAL) BY MOUTH 2 (TWO) TIMES DAILY WITH A MEAL. 180 tablet 1  . cetirizine (ZYRTEC) 10 MG tablet Take 10 mg by mouth See admin instructions. Take 1 tablet (10 mg) by mouth daily during allergy season    . econazole nitrate 1 % cream Apply topically daily. (Patient taking differently: Apply 1 application topically daily as needed (facial dryness/itching). ) 15 g 5  . HYDROcodone-acetaminophen (NORCO/VICODIN) 5-325 MG tablet Take 0.5-1 tablets by mouth 2 (two) times daily as needed (pain). 60 tablet 0  . losartan-hydrochlorothiazide (HYZAAR) 100-12.5 MG per tablet Take 1 tablet by mouth daily. 90 tablet 3  . nitroGLYCERIN (NITROLINGUAL) 0.4 MG/SPRAY spray Place 1 spray under the tongue every 5 (five) minutes x 3 doses as needed for chest pain. 12 g 12  . polyethylene glycol (MIRALAX / GLYCOLAX) packet Take 17 g by mouth daily. Mix in 8 oz liquid and drink    . thyroid (ARMOUR) 60 MG tablet Take 1 tablet (60 mg total) by mouth daily before breakfast. (Patient taking differently: Take 90 mg by mouth daily before breakfast. ) 30 tablet 6  . triamcinolone cream (KENALOG) 0.1 % Apply 1 application topically daily as needed (rash).     No current facility-administered medications on file prior to visit.    Allergies  Allergen Reactions  . Ciprofloxacin Other (See Comments)    Hallucinations or jittery  .  Codeine Other (See Comments)    hallucinations  . Flexeril [Cyclobenzaprine]   . Methocarbamol   . Sulfonamide Derivatives Other (See Comments)    Chills and shaking "serum sickness"  . Flagyl [Metronidazole] Rash    Past Medical History  Diagnosis Date  . Diverticulitis     recurrent  . Tinnitus   . Hearing aid worn   . Kidney stone     lithotripsy [id number] w complications, req stents, Dr Rosana Hoes  . Prostate CA ([place])   . Complication of anesthesia     " HAD TREMERS AFTER PROSTATE SURGERY"  . Family history of anesthesia complication     MOTHER   . Coronary artery  disease   . Anginal pain ([place])   . Hypertension   . Hyperlipemia   . Anxiety     " OCCASIONAL"  . Asthma due to seasonal allergies     uses inhalers prn  . Shortness of breath   . TIA (transient ischemic attack)   . GERD (gastroesophageal reflux disease)   . H/O hiatal hernia   . Arthritis   . Carotid artery occlusion     Left    Past Surgical History  Procedure Laterality Date  . Carotid endarterectomy  Left    1998  . Prostatectomy  1998    radical for prostate cancer  . Cardiac catheterization  2008    minimal dz, Dr Cathie Olden  . Cholecystectomy    . Left heart catheterization with coronary angiogram N/A 09/15/2013    Procedure: LEFT HEART CATHETERIZATION WITH CORONARY ANGIOGRAM;  Surgeon: Naryah Clenney M Martinique, MD;  Location: [facility] C[facility] LAB;  Service: Cardiovascular;  Laterality: N/A;    History  Smoking status  . Former Smoker -- 2.50 packs/day for 30 years  . Types: Cigarettes  . Quit date: 08/28/1985  Smokeless tobacco  . Never Used    History  Alcohol Use  . 1.2 oz/week  . 2 Standard drinks or equivalent per week    Comment: 1 glass wine/week (prior 1 glass martini with dinner)    Family History  Problem Relation Age of Onset  . Heart disease Mother   . Heart disease Father   . Kidney failure Father   . Heart attack Sister   . Dementia Sister   . Sjogren's syndrome Child   . Hashimoto's thyroiditis Child   . CAD Child     Review of Systems: As noted in HPI.  All other systems were reviewed and are negative.  Physical Exam: BP 112/64 mmHg  Pulse 54  Ht 5\' 6"  (1.676 m)  Wt 96.888 kg (213 lb 9.6 oz)  BMI 34.49 kg/m2 Obese WM in NAD HEENT: [employer]/AT,PERRL, EOMI, oropharynx is clear.  Neck: supple without JVD, adenopathy, or bruits. Lungs: clear CV: RRR, normal S1-2, no gallop.  Soft 1/6 systolic murmur at the apex Abd: soft NT, BS +. No HSM or masses. Ext: no edema. Normal pulses. No radial hematoma Neuro: nonfocal.  LABORATORY DATA: Lab Results    Component Value Date   WBC 5.5 08/09/2015   HGB 14.0 08/09/2015   HCT 40.4 08/09/2015   PLT 123* 08/09/2015   GLUCOSE 95 08/09/2015   CHOL 108 03/24/2015   TRIG 119 03/24/2015   HDL 33* 03/24/2015   LDLDIRECT 70 12/21/2012   LDLCALC 51 03/24/2015   ALT 15 12/21/2012   AST 19 12/21/2012   NA 135 08/09/2015   K 4.4 08/09/2015   CL 102 08/09/2015   CREATININE 1.31* 08/09/2015  BUN 19 08/09/2015   CO2 31 08/09/2015   TSH 1.12 09/25/2015   PSA <0.01 03/25/2015   INR 1.11 09/15/2013   HGBA1C 5.4 03/24/2015    Ecg today shows NSR rate 54. RBBB.  I have personally reviewed and interpreted this study.   Assessment / Plan: 1. CAD with moderate proximal RCA stenosis in 2015. FFR suggested this was not hemodynamically significant. He is now experiencing progressive typical angina suggesting progressive disease. Already on Coreg and amlodipine. Class 3 angina. I have recommended repeat cardiac cath with PCI if indicated. This may allow reduction in antianginal therapy. Afterwards will start exercise program and weight loss.   2. Dyslipidemia. Statin intolerant. May need to consider Zetia. Plan to repeat lipid panel in 3-4 weeks to establish baseline.   3. HTN controlled.  4. Carotid arterial disease s/p LCEA. 80% RICA stenosis. Asymptomatic.

## 2015-10-03 LAB — CBC WITH DIFFERENTIAL/PLATELET
BASOS PCT: 1 % (ref 0–1)
Basophils Absolute: 0.1 10*3/uL (ref 0.0–0.1)
Eosinophils Absolute: 0.7 10*3/uL (ref 0.0–0.7)
Eosinophils Relative: 13 % — ABNORMAL HIGH (ref 0–5)
HEMATOCRIT: 40.3 % (ref 39.0–52.0)
HEMOGLOBIN: 14.3 g/dL (ref 13.0–17.0)
LYMPHS ABS: 1.5 10*3/uL (ref 0.7–4.0)
LYMPHS PCT: 28 % (ref 12–46)
MCH: 33.9 pg (ref 26.0–34.0)
MCHC: 35.5 g/dL (ref 30.0–36.0)
MCV: 95.5 fL (ref 78.0–100.0)
MPV: 10.1 fL (ref 8.6–12.4)
Monocytes Absolute: 0.6 10*3/uL (ref 0.1–1.0)
Monocytes Relative: 12 % (ref 3–12)
Neutro Abs: 2.4 10*3/uL (ref 1.7–7.7)
Neutrophils Relative %: 46 % (ref 43–77)
Platelets: 127 10*3/uL — ABNORMAL LOW (ref 150–400)
RBC: 4.22 MIL/uL (ref 4.22–5.81)
RDW: 14.6 % (ref 11.5–15.5)
WBC: 5.3 10*3/uL (ref 4.0–10.5)

## 2015-10-03 LAB — PROTIME-INR
INR: 1.04 (ref <1.50)
PROTHROMBIN TIME: 13.7 s (ref 11.6–15.2)

## 2015-10-03 LAB — BASIC METABOLIC PANEL
BUN: 20 mg/dL (ref 7–25)
CHLORIDE: 103 mmol/L (ref 98–110)
CO2: 27 mmol/L (ref 20–31)
Calcium: 9.1 mg/dL (ref 8.6–10.3)
Creat: 1.62 mg/dL — ABNORMAL HIGH (ref 0.70–1.11)
GLUCOSE: 120 mg/dL — AB (ref 65–99)
POTASSIUM: 3.9 mmol/L (ref 3.5–5.3)
Sodium: 139 mmol/L (ref 135–146)

## 2015-10-09 ENCOUNTER — Encounter (HOSPITAL_COMMUNITY): Payer: Self-pay | Admitting: General Practice

## 2015-10-09 ENCOUNTER — Ambulatory Visit (HOSPITAL_COMMUNITY)
Admission: RE | Admit: 2015-10-09 | Discharge: 2015-10-10 | Disposition: A | Discharge status: Home / Self Care | Payer: Medicare Other | Source: Ambulatory Visit | Attending: Cardiology | Admitting: Cardiology

## 2015-10-09 ENCOUNTER — Encounter (HOSPITAL_COMMUNITY)
Admission: RE | Disposition: A | Discharge status: Home / Self Care | Payer: Self-pay | Source: Ambulatory Visit | Attending: Cardiology

## 2015-10-09 DIAGNOSIS — Z8673 Personal history of transient ischemic attack (TIA), and cerebral infarction without residual deficits: Secondary | ICD-10-CM | POA: Insufficient documentation

## 2015-10-09 DIAGNOSIS — I25119 Atherosclerotic heart disease of native coronary artery with unspecified angina pectoris: Secondary | ICD-10-CM | POA: Diagnosis not present

## 2015-10-09 DIAGNOSIS — I10 Essential (primary) hypertension: Secondary | ICD-10-CM | POA: Diagnosis not present

## 2015-10-09 DIAGNOSIS — Z8249 Family history of ischemic heart disease and other diseases of the circulatory system: Secondary | ICD-10-CM | POA: Insufficient documentation

## 2015-10-09 DIAGNOSIS — E785 Hyperlipidemia, unspecified: Secondary | ICD-10-CM | POA: Diagnosis not present

## 2015-10-09 DIAGNOSIS — I2510 Atherosclerotic heart disease of native coronary artery without angina pectoris: Secondary | ICD-10-CM | POA: Diagnosis present

## 2015-10-09 DIAGNOSIS — Z7982 Long term (current) use of aspirin: Secondary | ICD-10-CM | POA: Insufficient documentation

## 2015-10-09 DIAGNOSIS — I209 Angina pectoris, unspecified: Secondary | ICD-10-CM | POA: Diagnosis present

## 2015-10-09 DIAGNOSIS — Z87891 Personal history of nicotine dependence: Secondary | ICD-10-CM | POA: Diagnosis not present

## 2015-10-09 DIAGNOSIS — I2584 Coronary atherosclerosis due to calcified coronary lesion: Secondary | ICD-10-CM | POA: Diagnosis not present

## 2015-10-09 DIAGNOSIS — Z8546 Personal history of malignant neoplasm of prostate: Secondary | ICD-10-CM | POA: Insufficient documentation

## 2015-10-09 DIAGNOSIS — I2583 Coronary atherosclerosis due to lipid rich plaque: Secondary | ICD-10-CM

## 2015-10-09 DIAGNOSIS — Z955 Presence of coronary angioplasty implant and graft: Secondary | ICD-10-CM

## 2015-10-09 HISTORY — PX: CARDIAC CATHETERIZATION: SHX172

## 2015-10-09 HISTORY — PX: CORONARY STENT PLACEMENT: SHX1402

## 2015-10-09 LAB — POCT ACTIVATED CLOTTING TIME
ACTIVATED CLOTTING TIME: 270 s
Activated Clotting Time: 281 seconds

## 2015-10-09 SURGERY — LEFT HEART CATH AND CORONARY ANGIOGRAPHY
Anesthesia: LOCAL

## 2015-10-09 MED ORDER — NITROGLYCERIN 1 MG/10 ML FOR IR/CATH LAB
INTRA_ARTERIAL | Status: AC
  Filled 2015-10-09: qty 10

## 2015-10-09 MED ORDER — HEPARIN SODIUM (PORCINE) 1000 UNIT/ML IJ SOLN
INTRAMUSCULAR | Status: AC
  Filled 2015-10-09: qty 1

## 2015-10-09 MED ORDER — ANGIOPLASTY BOOK
Freq: Once | Status: AC
  Administered 2015-10-10: 02:00:00
  Filled 2015-10-09: qty 1

## 2015-10-09 MED ORDER — SODIUM CHLORIDE 0.9 % WEIGHT BASED INFUSION
3.0000 mL/kg/h | INTRAVENOUS | Status: DC
Start: 2015-10-10 — End: 2015-10-09
  Administered 2015-10-09: 3 mL/kg/h via INTRAVENOUS

## 2015-10-09 MED ORDER — FENTANYL CITRATE (PF) 100 MCG/2ML IJ SOLN
INTRAMUSCULAR | Status: DC | PRN
  Administered 2015-10-09 (×2): 25 ug via INTRAVENOUS

## 2015-10-09 MED ORDER — LIDOCAINE HCL (PF) 1 % IJ SOLN
INTRAMUSCULAR | Status: DC | PRN
  Administered 2015-10-09: 2 mL

## 2015-10-09 MED ORDER — ASPIRIN 81 MG PO CHEW: 81.0000 mg | CHEWABLE_TABLET | ORAL | Status: DC

## 2015-10-09 MED ORDER — MIDAZOLAM HCL 2 MG/2ML IJ SOLN
INTRAMUSCULAR | Status: AC
  Filled 2015-10-09: qty 2

## 2015-10-09 MED ORDER — LOSARTAN POTASSIUM 50 MG PO TABS
100.0000 mg | ORAL_TABLET | Freq: Every day | ORAL | Status: DC
  Administered 2015-10-10: 100 mg via ORAL
  Filled 2015-10-09: qty 2

## 2015-10-09 MED ORDER — ASPIRIN 81 MG PO CHEW
81.0000 mg | CHEWABLE_TABLET | Freq: Every day | ORAL | Status: DC
  Administered 2015-10-10: 81 mg via ORAL
  Filled 2015-10-09: qty 1

## 2015-10-09 MED ORDER — IOPAMIDOL (ISOVUE-370) INJECTION 76%
INTRAVENOUS | Status: AC
  Filled 2015-10-09: qty 100

## 2015-10-09 MED ORDER — THYROID 60 MG PO TABS
90.0000 mg | ORAL_TABLET | Freq: Every day | ORAL | Status: DC
  Administered 2015-10-09 – 2015-10-10 (×2): 90 mg via ORAL
  Filled 2015-10-09 (×2): qty 1

## 2015-10-09 MED ORDER — NITROGLYCERIN 1 MG/10 ML FOR IR/CATH LAB
INTRA_ARTERIAL | Status: DC | PRN
  Administered 2015-10-09: 200 ug via INTRACORONARY

## 2015-10-09 MED ORDER — SODIUM CHLORIDE 0.9% FLUSH: 3.0000 mL | INTRAVENOUS | Status: DC | PRN

## 2015-10-09 MED ORDER — VERAPAMIL HCL 2.5 MG/ML IV SOLN
INTRAVENOUS | Status: DC | PRN
  Administered 2015-10-09: 09:00:00 via INTRA_ARTERIAL

## 2015-10-09 MED ORDER — HYDROCHLOROTHIAZIDE 12.5 MG PO CAPS
12.5000 mg | ORAL_CAPSULE | Freq: Every day | ORAL | Status: DC
  Administered 2015-10-10: 12.5 mg via ORAL
  Filled 2015-10-09: qty 1

## 2015-10-09 MED ORDER — SODIUM CHLORIDE 0.9 % IV SOLN: 250.0000 mL | INTRAVENOUS | Status: DC | PRN

## 2015-10-09 MED ORDER — MIDAZOLAM HCL 2 MG/2ML IJ SOLN
INTRAMUSCULAR | Status: DC | PRN
  Administered 2015-10-09 (×2): 1 mg via INTRAVENOUS

## 2015-10-09 MED ORDER — CLOPIDOGREL BISULFATE 300 MG PO TABS
ORAL_TABLET | ORAL | Status: AC
  Filled 2015-10-09: qty 2

## 2015-10-09 MED ORDER — FENTANYL CITRATE (PF) 100 MCG/2ML IJ SOLN
INTRAMUSCULAR | Status: AC
  Filled 2015-10-09: qty 2

## 2015-10-09 MED ORDER — CYANOCOBALAMIN 500 MCG PO TABS
500.0000 ug | ORAL_TABLET | Freq: Every day | ORAL | Status: DC
  Administered 2015-10-10: 500 ug via ORAL
  Filled 2015-10-09 (×2): qty 1

## 2015-10-09 MED ORDER — CLOPIDOGREL BISULFATE 75 MG PO TABS
75.0000 mg | ORAL_TABLET | Freq: Every day | ORAL | Status: DC
  Administered 2015-10-10: 75 mg via ORAL
  Filled 2015-10-09 (×2): qty 1

## 2015-10-09 MED ORDER — SODIUM CHLORIDE 0.9 % WEIGHT BASED INFUSION
3.0000 mL/kg/h | INTRAVENOUS | Status: AC
  Administered 2015-10-09: 12:00:00 3 mL/kg/h via INTRAVENOUS

## 2015-10-09 MED ORDER — ONDANSETRON HCL 4 MG/2ML IJ SOLN: 4.0000 mg | Freq: Four times a day (QID) | INTRAMUSCULAR | Status: DC | PRN

## 2015-10-09 MED ORDER — IOPAMIDOL (ISOVUE-370) INJECTION 76%
INTRAVENOUS | Status: DC | PRN
  Administered 2015-10-09: 110 mL via INTRAVENOUS

## 2015-10-09 MED ORDER — IOPAMIDOL (ISOVUE-370) INJECTION 76%
INTRAVENOUS | Status: AC
  Filled 2015-10-09: qty 50

## 2015-10-09 MED ORDER — HEPARIN (PORCINE) IN NACL 2-0.9 UNIT/ML-% IJ SOLN
INTRAMUSCULAR | Status: DC | PRN
  Administered 2015-10-09: 1000 mL

## 2015-10-09 MED ORDER — METHYLCOBALAMIN 1000 MCG PO LOZG: 400.0000 ug | LOZENGE | Freq: Every day | ORAL | Status: DC

## 2015-10-09 MED ORDER — SODIUM CHLORIDE 0.9 % WEIGHT BASED INFUSION
1.0000 mL/kg/h | INTRAVENOUS | Status: DC
Start: 2015-10-10 — End: 2015-10-09
  Administered 2015-10-09: 1 mL/kg/h via INTRAVENOUS

## 2015-10-09 MED ORDER — LIDOCAINE HCL (PF) 1 % IJ SOLN
INTRAMUSCULAR | Status: AC
  Filled 2015-10-09: qty 30

## 2015-10-09 MED ORDER — ADENOSINE 12 MG/4ML IV SOLN
16.0000 mL | Freq: Once | INTRAVENOUS | Status: DC
  Filled 2015-10-09: qty 16

## 2015-10-09 MED ORDER — HEPARIN (PORCINE) IN NACL 2-0.9 UNIT/ML-% IJ SOLN
INTRAMUSCULAR | Status: AC
  Filled 2015-10-09: qty 1000

## 2015-10-09 MED ORDER — ACETAMINOPHEN 325 MG PO TABS: 650.0000 mg | ORAL_TABLET | ORAL | Status: DC | PRN

## 2015-10-09 MED ORDER — LOSARTAN POTASSIUM-HCTZ 100-12.5 MG PO TABS: 1.0000 | ORAL_TABLET | Freq: Every day | ORAL | Status: DC

## 2015-10-09 MED ORDER — VERAPAMIL HCL 2.5 MG/ML IV SOLN
INTRAVENOUS | Status: AC
  Filled 2015-10-09: qty 2

## 2015-10-09 MED ORDER — ADENOSINE (DIAGNOSTIC) 140MCG/KG/MIN
INTRAVENOUS | Status: DC | PRN
  Administered 2015-10-09: 140 ug/kg/min via INTRAVENOUS

## 2015-10-09 MED ORDER — CLOPIDOGREL BISULFATE 300 MG PO TABS
ORAL_TABLET | ORAL | Status: DC | PRN
  Administered 2015-10-09: 600 mg via ORAL

## 2015-10-09 MED ORDER — SODIUM CHLORIDE 0.9% FLUSH
3.0000 mL | Freq: Two times a day (BID) | INTRAVENOUS | Status: DC
  Administered 2015-10-09: 10 mL via INTRAVENOUS

## 2015-10-09 MED ORDER — EZETIMIBE 10 MG PO TABS
10.0000 mg | ORAL_TABLET | Freq: Every day | ORAL | Status: DC
  Administered 2015-10-09 – 2015-10-10 (×2): 10 mg via ORAL
  Filled 2015-10-09 (×2): qty 1

## 2015-10-09 MED ORDER — HEPARIN SODIUM (PORCINE) 1000 UNIT/ML IJ SOLN
INTRAMUSCULAR | Status: DC | PRN
  Administered 2015-10-09: 5000 [IU] via INTRAVENOUS
  Administered 2015-10-09: 3000 [IU] via INTRAVENOUS
  Administered 2015-10-09: 2000 [IU] via INTRAVENOUS

## 2015-10-09 MED ORDER — SODIUM CHLORIDE 0.9% FLUSH: 3.0000 mL | Freq: Two times a day (BID) | INTRAVENOUS | Status: DC

## 2015-10-09 MED ORDER — CARVEDILOL 3.125 MG PO TABS
3.1250 mg | ORAL_TABLET | Freq: Two times a day (BID) | ORAL | Status: DC
  Administered 2015-10-09 – 2015-10-10 (×2): 3.125 mg via ORAL
  Filled 2015-10-09 (×2): qty 1

## 2015-10-09 SURGICAL SUPPLY — 21 items
BALLN EUPHORA RX 2.5X12 (BALLOONS) ×2
BALLN ~~LOC~~ EUPHORA RX 3.25X12 (BALLOONS) ×2
BALLOON EUPHORA RX 2.5X12 (BALLOONS) IMPLANT
BALLOON ~~LOC~~ EUPHORA RX 3.25X12 (BALLOONS) IMPLANT
CATH INFINITI 5 FR JL3.5 (CATHETERS) ×1 IMPLANT
CATH INFINITI 5FR ANG PIGTAIL (CATHETERS) ×1 IMPLANT
CATH INFINITI JR4 5F (CATHETERS) ×1 IMPLANT
CATH MICROCATH NAVVUS (MICROCATHETER) IMPLANT
GLIDESHEATH SLEND SS 6F .021 (SHEATH) ×1 IMPLANT
GUIDE CATH RUNWAY 6FR FR4 (CATHETERS) ×1 IMPLANT
KIT ENCORE 26 ADVANTAGE (KITS) ×1 IMPLANT
KIT ESSENTIALS PG (KITS) ×1 IMPLANT
KIT HEART LEFT (KITS) ×2 IMPLANT
MICROCATHETER NAVVUS (MICROCATHETER) ×2
PACK CARDIAC CATHETERIZATION (CUSTOM PROCEDURE TRAY) ×2 IMPLANT
STENT SYNERGY DES 3X16 (Permanent Stent) ×1 IMPLANT
SYR MEDRAD MARK V 150ML (SYRINGE) ×2 IMPLANT
TRANSDUCER W/STOPCOCK (MISCELLANEOUS) ×2 IMPLANT
TUBING CIL FLEX 10 FLL-RA (TUBING) ×1 IMPLANT
WIRE ASAHI PROWATER 180CM (WIRE) ×1 IMPLANT
WIRE SAFE-T 1.5MM-J .035X260CM (WIRE) ×1 IMPLANT

## 2015-10-09 NOTE — Progress Notes (Signed)
TR BAND REMOVAL  LOCATION:    right radial  DEFLATED PER PROTOCOL:    Yes.    TIME BAND OFF / DRESSING APPLIED:    1730   SITE UPON ARRIVAL:    Level 0  SITE AFTER BAND REMOVAL:    Level 0  CIRCULATION SENSATION AND MOVEMENT:    Within Normal Limits   Yes.    COMMENTS:   Post radial cath instructions reviewed w/ pt, asks appropriate questions, all questions answered.

## 2015-10-09 NOTE — Interval H&P Note (Signed)
History and Physical Interval Note:  10/09/2015 8:28 AM  [name]  has presented today for surgery, with the diagnosis of cad, cp  The various methods of treatment have been discussed with the patient and family. After consideration of risks, benefits and other options for treatment, the patient has consented to  Procedure(s): Left Heart Cath and Coronary Angiography (N/A) as a surgical intervention .  The patient's history has been reviewed, patient examined, no change in status, stable for surgery.  I have reviewed the patient's chart and labs.  Questions were answered to the patient's satisfaction.   Cath Lab Visit (complete for each Cath Lab visit)  Clinical Evaluation Leading to the Procedure:   ACS: No.  Non-ACS:    Anginal Classification: CCS III  Anti-ischemic medical therapy: Maximal Therapy (2 or more classes of medications)  Non-Invasive Test Results: No non-invasive testing performed  Prior CABG: No previous CABG        [name] [facility] 10/09/2015 8:28 AM

## 2015-10-09 NOTE — Progress Notes (Signed)
Admitted to room 6C05, Rt radial band clean and intact.  TR Band deflation begun at 1145.  Bleeding noted at 2nd deflation.  Air returned to band up to 16 cc to get bleeding to stop.  Site cleansed and monitored, no further bleeding at this time.  Wife and patient aware to call RN immediately for any bleeding.

## 2015-10-09 NOTE — H&P (View-Only) (Signed)
[name] Date of Birth: 1933/08/13 Medical Record [id number]  History of Present Illness: [name] is seen at the request of Dr. Lorelei Pont for evaluation of increasing angina. He has a known history of CAD from evaluation in 2015. He was  hospitalized with atypical chest pain. He ruled out for MI. Cardiac cath was done and demonstrated a 70% stenosis of the proximal RCA. FFR was 0.82. It was felt that this lesion was not flow limiting and he was treated medically. No other significant CAD. Coreg was started at a low dose. On follow up in April 2015 he denied any symptoms. He has not followed up with me since. More recently he has noted symptoms of chest pain described as a tightness and ache in his mid chest associated with SOB. This is related to exertion. It is relieved with SL Ntg. Symptoms have progressed at are occuring at lower levels of exertion and more frequent. This is limiting. He is interested in starting an exercise program since he has gained 25 lbs in the last 2 years.  He was admitted in September 2016 with generalized weakness and arthralgias. This was felt to be related to statins and this was stopped. More recently a statin was resumed and he note a return of these symptoms. He does complain of increased fatigue. He is on thyroid replacement now. He is followed by Dr. Scot Dock for carotid disease and is s/p left CEA. Evaluation in Dec 2016 showed an 80% right ICA stenosis. This is being followed closely.   Current Outpatient Prescriptions on File Prior to Visit  Medication Sig Dispense Refill  . albuterol (PROVENTIL HFA) 108 (90 BASE) MCG/ACT inhaler Inhale 2 puffs into the lungs every 4 (four) hours as needed for wheezing. 18 g 0  . Artificial Tear Solution (BION TEARS OP) Place 1 drop into both eyes daily as needed (dry eyes).    Marland Kitchen aspirin (BAYER ASPIRIN) 325 MG tablet Take 325 mg by mouth every other day. Alternate with #2 81 mg aspirins    . carvedilol (COREG) 3.125 MG tablet  TAKE 1 TABLET (3.125 MG TOTAL) BY MOUTH 2 (TWO) TIMES DAILY WITH A MEAL. 180 tablet 1  . cetirizine (ZYRTEC) 10 MG tablet Take 10 mg by mouth See admin instructions. Take 1 tablet (10 mg) by mouth daily during allergy season    . econazole nitrate 1 % cream Apply topically daily. (Patient taking differently: Apply 1 application topically daily as needed (facial dryness/itching). ) 15 g 5  . HYDROcodone-acetaminophen (NORCO/VICODIN) 5-325 MG tablet Take 0.5-1 tablets by mouth 2 (two) times daily as needed (pain). 60 tablet 0  . losartan-hydrochlorothiazide (HYZAAR) 100-12.5 MG per tablet Take 1 tablet by mouth daily. 90 tablet 3  . nitroGLYCERIN (NITROLINGUAL) 0.4 MG/SPRAY spray Place 1 spray under the tongue every 5 (five) minutes x 3 doses as needed for chest pain. 12 g 12  . polyethylene glycol (MIRALAX / GLYCOLAX) packet Take 17 g by mouth daily. Mix in 8 oz liquid and drink    . thyroid (ARMOUR) 60 MG tablet Take 1 tablet (60 mg total) by mouth daily before breakfast. (Patient taking differently: Take 90 mg by mouth daily before breakfast. ) 30 tablet 6  . triamcinolone cream (KENALOG) 0.1 % Apply 1 application topically daily as needed (rash).     No current facility-administered medications on file prior to visit.    Allergies  Allergen Reactions  . Ciprofloxacin Other (See Comments)    Hallucinations or jittery  .  Codeine Other (See Comments)    hallucinations  . Flexeril [Cyclobenzaprine]   . Methocarbamol   . Sulfonamide Derivatives Other (See Comments)    Chills and shaking "serum sickness"  . Flagyl [Metronidazole] Rash    Past Medical History  Diagnosis Date  . Diverticulitis     recurrent  . Tinnitus   . Hearing aid worn   . Kidney stone     lithotripsy [id number] w complications, req stents, Dr Rosana Hoes  . Prostate CA ([place])   . Complication of anesthesia     " HAD TREMERS AFTER PROSTATE SURGERY"  . Family history of anesthesia complication     MOTHER   . Coronary artery  disease   . Anginal pain ([place])   . Hypertension   . Hyperlipemia   . Anxiety     " OCCASIONAL"  . Asthma due to seasonal allergies     uses inhalers prn  . Shortness of breath   . TIA (transient ischemic attack)   . GERD (gastroesophageal reflux disease)   . H/O hiatal hernia   . Arthritis   . Carotid artery occlusion     Left    Past Surgical History  Procedure Laterality Date  . Carotid endarterectomy  Left    1998  . Prostatectomy  1998    radical for prostate cancer  . Cardiac catheterization  2008    minimal dz, Dr Cathie Olden  . Cholecystectomy    . Left heart catheterization with coronary angiogram N/A 09/15/2013    Procedure: LEFT HEART CATHETERIZATION WITH CORONARY ANGIOGRAM;  Surgeon: Peter M Martinique, MD;  Location: [facility] CATH LAB;  Service: Cardiovascular;  Laterality: N/A;    History  Smoking status  . Former Smoker -- 2.50 packs/day for 30 years  . Types: Cigarettes  . Quit date: 08/28/1985  Smokeless tobacco  . Never Used    History  Alcohol Use  . 1.2 oz/week  . 2 Standard drinks or equivalent per week    Comment: 1 glass wine/week (prior 1 glass martini with dinner)    Family History  Problem Relation Age of Onset  . Heart disease Mother   . Heart disease Father   . Kidney failure Father   . Heart attack Sister   . Dementia Sister   . Sjogren's syndrome Child   . Hashimoto's thyroiditis Child   . CAD Child     Review of Systems: As noted in HPI.  All other systems were reviewed and are negative.  Physical Exam: BP 112/64 mmHg  Pulse 54  Ht 5\' 6"  (1.676 m)  Wt 96.888 kg (213 lb 9.6 oz)  BMI 34.49 kg/m2 Obese WM in NAD HEENT: [place]/AT,PERRL, EOMI, oropharynx is clear.  Neck: supple without JVD, adenopathy, or bruits. Lungs: clear CV: RRR, normal S1-2, no gallop.  Soft 1/6 systolic murmur at the apex Abd: soft NT, BS +. No HSM or masses. Ext: no edema. Normal pulses. No radial hematoma Neuro: nonfocal.  LABORATORY DATA: Lab Results    Component Value Date   WBC 5.5 08/09/2015   HGB 14.0 08/09/2015   HCT 40.4 08/09/2015   PLT 123* 08/09/2015   GLUCOSE 95 08/09/2015   CHOL 108 03/24/2015   TRIG 119 03/24/2015   HDL 33* 03/24/2015   LDLDIRECT 70 12/21/2012   LDLCALC 51 03/24/2015   ALT 15 12/21/2012   AST 19 12/21/2012   NA 135 08/09/2015   K 4.4 08/09/2015   CL 102 08/09/2015   CREATININE 1.31* 08/09/2015  BUN 19 08/09/2015   CO2 31 08/09/2015   TSH 1.12 09/25/2015   PSA <0.01 03/25/2015   INR 1.11 09/15/2013   HGBA1C 5.4 03/24/2015    Ecg today shows NSR rate 54. RBBB.  I have personally reviewed and interpreted this study.   Assessment / Plan: 1. CAD with moderate proximal RCA stenosis in 2015. FFR suggested this was not hemodynamically significant. He is now experiencing progressive typical angina suggesting progressive disease. Already on Coreg and amlodipine. Class 3 angina. I have recommended repeat cardiac cath with PCI if indicated. This may allow reduction in antianginal therapy. Afterwards will start exercise program and weight loss.   2. Dyslipidemia. Statin intolerant. May need to consider Zetia. Plan to repeat lipid panel in 3-4 weeks to establish baseline.   3. HTN controlled.  4. Carotid arterial disease s/p LCEA. 80% RICA stenosis. Asymptomatic.

## 2015-10-09 NOTE — Research (Signed)
[facility] RESEARCH STUDY Informed Consent   Subject Name: [name]  Subject met inclusion and exclusion criteria.  The informed consent form, study requirements and expectations were reviewed with the subject and questions and concerns were addressed prior to the signing of the consent form.  The subject verbalized understanding of the trial requirements.  The subject agreed to participate in the [place] trial and signed the informed consent.  The informed consent was obtained prior to performance of any protocol-specific procedures for the subject.  A copy of the signed informed consent was given to the subject and a copy was placed in the subject's medical record.  Marlana Salvage 10/09/2015, 07:05 am

## 2015-10-09 NOTE — Care Management Note (Addendum)
Case Management Note  Patient Details  Name: [name] MRN: [id number] Date of Birth: [birth year]  Subjective/Objective:    Patient is from home with spouse, pta indep,  States he will not need HH services at discharge. Has PCP.  NCM will cont to follow for dc needs.                 Action/Plan:   Expected Discharge Date:                  Expected Discharge Plan:  Home/Self Care  In-House Referral:     Discharge planning Services  CM Consult  Post Acute Care Choice:    Choice offered to:     DME Arranged:    DME Agency:     HH Arranged:    [place] Agency:     Status of Service:  Completed, signed off  Medicare Important Message Given:    Date Medicare IM Given:    Medicare IM give by:    Date Additional Medicare IM Given:    Additional Medicare Important Message give by:     If discussed at [place] of Stay Meetings, dates discussed:    Additional Comments:  Zenon Mayo, RN 10/09/2015, 11:32 AM

## 2015-10-09 NOTE — Progress Notes (Signed)
Pt states that Pt up to bathroom, glasses had fallen on floor and was stepped on, lens popped out, found screw, pt states if we find a screwdriver we can put them back together.

## 2015-10-10 DIAGNOSIS — I10 Essential (primary) hypertension: Secondary | ICD-10-CM

## 2015-10-10 DIAGNOSIS — I2584 Coronary atherosclerosis due to calcified coronary lesion: Secondary | ICD-10-CM | POA: Diagnosis not present

## 2015-10-10 DIAGNOSIS — I25118 Atherosclerotic heart disease of native coronary artery with other forms of angina pectoris: Secondary | ICD-10-CM

## 2015-10-10 DIAGNOSIS — E785 Hyperlipidemia, unspecified: Secondary | ICD-10-CM | POA: Diagnosis not present

## 2015-10-10 DIAGNOSIS — I25119 Atherosclerotic heart disease of native coronary artery with unspecified angina pectoris: Secondary | ICD-10-CM | POA: Diagnosis not present

## 2015-10-10 LAB — BASIC METABOLIC PANEL
ANION GAP: 7 (ref 5–15)
BUN: 14 mg/dL (ref 6–20)
CHLORIDE: 105 mmol/L (ref 101–111)
CO2: 27 mmol/L (ref 22–32)
Calcium: 8.8 mg/dL — ABNORMAL LOW (ref 8.9–10.3)
Creatinine, Ser: 1.31 mg/dL — ABNORMAL HIGH (ref 0.61–1.24)
GFR calc Af Amer: 58 mL/min — ABNORMAL LOW (ref >60)
GFR, EST NON AFRICAN AMERICAN: 50 mL/min — AB (ref >60)
GLUCOSE: 101 mg/dL — AB (ref 65–99)
POTASSIUM: 4.3 mmol/L (ref 3.5–5.1)
Sodium: 139 mmol/L (ref 135–145)

## 2015-10-10 LAB — CBC
HEMATOCRIT: 36.7 % — AB (ref 39.0–52.0)
HEMOGLOBIN: 12.7 g/dL — AB (ref 13.0–17.0)
MCH: 33.3 pg (ref 26.0–34.0)
MCHC: 34.6 g/dL (ref 30.0–36.0)
MCV: 96.3 fL (ref 78.0–100.0)
Platelets: 100 10*3/uL — ABNORMAL LOW (ref 150–400)
RBC: 3.81 MIL/uL — ABNORMAL LOW (ref 4.22–5.81)
RDW: 13.6 % (ref 11.5–15.5)
WBC: 4.8 10*3/uL (ref 4.0–10.5)

## 2015-10-10 MED ORDER — CLOPIDOGREL BISULFATE 75 MG PO TABS: 75.0000 mg | ORAL_TABLET | Freq: Every day | ORAL | Status: DC

## 2015-10-10 MED ORDER — ASPIRIN 81 MG PO TABS: 81.0000 mg | ORAL_TABLET | Freq: Every day | ORAL | Status: DC

## 2015-10-10 MED ORDER — EZETIMIBE 10 MG PO TABS: 10.0000 mg | ORAL_TABLET | Freq: Every day | ORAL | Status: DC

## 2015-10-10 MED ORDER — CARVEDILOL 6.25 MG PO TABS: 6.2500 mg | ORAL_TABLET | Freq: Two times a day (BID) | ORAL | Status: DC

## 2015-10-10 NOTE — Care Management Note (Signed)
Case Management Note  Patient Details  Name: [name] MRN: [id number] Date of Birth: 10-24-1933  Subjective/Objective:  Patient is for dc today, he walked 800 ft with Cardiac Rehab, will be on plavix, no needs.    Action/Plan:   Expected Discharge Date:                  Expected Discharge Plan:  Home/Self Care  In-House Referral:     Discharge planning Services  CM Consult  Post Acute Care Choice:    Choice offered to:     DME Arranged:    DME Agency:     HH Arranged:    [place] Agency:     Status of Service:  Completed, signed off  Medicare Important Message Given:    Date Medicare IM Given:    Medicare IM give by:    Date Additional Medicare IM Given:    Additional Medicare Important Message give by:     If discussed at [place] of Stay Meetings, dates discussed:    Additional Comments:  Zenon Mayo, RN 10/10/2015, 12:13 PM

## 2015-10-10 NOTE — Progress Notes (Signed)
CARDIAC REHAB PHASE I   PRE:  Rate/Rhythm: 78 SR  BP:  Supine:   Sitting: 160/76  Standing:    SaO2:   MODE:  Ambulation: 800 ft   POST:  Rate/Rhythm: 92 SR  BP:  Supine:   Sitting: 162/78  Standing:    SaO2:  [id number] Stressed importance of plavix with stent. Pt wants to lose weight so a large amount of our discussion was on taking in less calories and burning up more. Encouraged green vegs which pt has some difficulty with. Encouraged CRP 2 to assist with weight loss and will refer to [facility]. Pt walked 800 ft with steady gait. No CP. Tolerated well. Reviewed NTG use, ex ed and diet ed.    Graylon Good, RN BSN  10/10/2015 9:11 AM

## 2015-10-10 NOTE — Progress Notes (Signed)
Pt states "I feel like I have trouble taking a deep breath, its like I'm trying to see if since I have a stent now, I can breath well again, like before there was a blockage." Reinforced teaching of CAD and stenting, pt verbalized understanding.  States he has some discomfort in his right chest area, states this discomfort is not new; has been there all day.  Upon initial assessment and rounding pt has denied pain. And at this time still denies pain, pt unable to rate discomfort on a pain scale. Informed to alert staff if discomfort changes in any way or becomes pain. EKG done; no changes from previous.

## 2015-10-10 NOTE — Discharge Summary (Signed)
Discharge Summary    Patient ID: [name],  MRN: [id number], DOB/AGE: [birth year] 80 y.o.  Admit date: 10/09/2015 Discharge date: 10/10/2015  Primary Care Provider: [facility] Primary Cardiologist: Dr. Martinique   Discharge Diagnoses    Principal Problem:   Angina pectoris [facility]) Active Problems:   Coronary artery disease   Hypertension   Hyperlipemia   Allergies Allergies  Allergen Reactions  . Ciprofloxacin Other (See Comments)    Hallucinations or jittery  . Codeine Other (See Comments)    hallucinations  . Flexeril [Cyclobenzaprine]   . Methocarbamol   . Sulfonamide Derivatives Other (See Comments)    Chills and shaking "serum sickness"  . Flagyl [Metronidazole] Rash    Diagnostic Studies/Procedures  Left heart catheterization   Mid RCA lesion, 40% stenosed.  Prox LAD to Mid LAD lesion, 20% stenosed.  Ost 1st Mrg to 1st Mrg lesion, 40% stenosed.  The left ventricular systolic function is normal.  Prox RCA lesion, 70% stenosed. Post intervention, there is a 0% residual stenosis.  1. Single vessel obstructive CAD 2. Abnormal FFR of the proximal RCA lesion 3. Normal LV function 4. Successful stenting of the proximal RCA with a DES  _____________   History of Present Illness    [name] is a 82 year old male with known history of CAD from evaluation in 2015. He was hospitalized with atypical chest pain, and ruled out for MI.  Cardiac cath was done and demonstrated a 70% stenosis of the proximal RCA. The lesion was treated medically, however he had some increasing chest tightness and some associated SOB.  He was seen outpatient and it was felt that repeat cardiac cath was indicated.      Hospital Course  Patient had left heart cath on 10/09/15.  Report above.  Had successful DES to RCA.  He had some chest discomfort overnight following the procedure.  No EKG changes.  His beta blocker will be increased to help with anginal symptoms.  He ambulated  with cardiac rehab without any chest discomfort of shortness of breath.  He will follow up outpatient in 2-4 weeks.    _____________  Discharge Vitals Blood pressure 165/58, pulse 67, temperature 98.4 F (36.9 C), temperature source Oral, resp. rate 19, height 5\' 6"  (1.676 m), weight 213 lb 13.5 oz (97 kg), SpO2 94 %.  Filed Weights   10/09/15 0625 10/10/15 0319  Weight: 213 lb (96.616 kg) 213 lb 13.5 oz (97 kg)    Labs & Radiologic Studies     CBC  Recent Labs  10/10/15 0602  WBC 4.8  HGB 12.7*  HCT 36.7*  MCV 96.3  PLT 123XX123*   Basic Metabolic Panel  Recent Labs  10/10/15 0602  NA 139  K 4.3  CL 105  CO2 27  GLUCOSE 101*  BUN 14  CREATININE 1.31*  CALCIUM 8.8*     Disposition   Pt is being discharged home today in good condition. Patient was seen by Dr. Claiborne Billings and deemed suitable for discharge.   Follow-up Plans & Appointments    Follow-up Information    Follow up with [facility] On 10/17/2015.   Specialty:  Cardiology   Why:  With Rosaria Ferries, PA at 11:30 am    Contact information:   [address] [place] [phone]     Discharge Instructions    AMB Referral to Cardiac Rehabilitation - Phase II    Complete by:  As directed  Diagnosis:  PCI     Amb Referral to Cardiac Rehabilitation    Complete by:  As directed   Diagnosis:  PCI Comment - referring to [place]     Diet - low sodium heart healthy    Complete by:  As directed      Discharge instructions    Complete by:  As directed   Radial Site Care Refer to this sheet in the next few weeks. These instructions provide you with information on caring for yourself after your procedure. Your caregiver may also give you more specific instructions. Your treatment has been planned according to current medical practices, but problems sometimes occur. Call your caregiver if you have any problems or questions after your procedure. HOME CARE  INSTRUCTIONS You may shower the day after the procedure.Remove the bandage (dressing) and gently wash the site with plain soap and water.Gently pat the site dry.  Do not apply powder or lotion to the site.  Do not submerge the affected site in water for 3 to 5 days.  Inspect the site at least twice daily.  Do not flex or bend the affected arm for 24 hours.  No lifting over 5 pounds (2.3 kg) for 5 days after your procedure.  Do not drive home if you are discharged the same day of the procedure. Have someone else drive you.  You may drive 24 hours after the procedure unless otherwise instructed by your caregiver.  What to expect: Any bruising will usually fade within 1 to 2 weeks.  Blood that collects in the tissue (hematoma) may be painful to the touch. It should usually decrease in size and tenderness within 1 to 2 weeks.  SEEK IMMEDIATE MEDICAL CARE IF: You have unusual pain at the radial site.  You have redness, warmth, swelling, or pain at the radial site.  You have drainage (other than a small amount of blood on the dressing).  You have chills.  You have a fever or persistent symptoms for more than 72 hours.  You have a fever and your symptoms suddenly get worse.  Your arm becomes pale, cool, tingly, or numb.  You have heavy bleeding from the site. Hold pressure on the site.   NO HEAVY LIFTING OR SEXUAL ACTIVITY for 7 DAYS. NO DRIVING for 3-5 DAYS. NO SOAKING BATHS, HOT TUBS, POOLS, ETC., for 7 DAYS     Increase activity slowly    Complete by:  As directed            Discharge Medications   Current Discharge Medication List    START taking these medications   Details  clopidogrel (PLAVIX) 75 MG tablet Take 1 tablet (75 mg total) by mouth daily with breakfast. Qty: 90 tablet, Refills: 3    ezetimibe (ZETIA) 10 MG tablet Take 1 tablet (10 mg total) by mouth daily. Qty: 90 tablet, Refills: 3      CONTINUE these medications which have CHANGED   Details  aspirin 81 MG  tablet Take 1 tablet (81 mg total) by mouth daily. Qty: 30 tablet, Refills: 12    carvedilol (COREG) 6.25 MG tablet Take 1 tablet (6.25 mg total) by mouth 2 (two) times daily with a meal. Qty: 180 tablet, Refills: 3      CONTINUE these medications which have NOT CHANGED   Details  albuterol (PROVENTIL HFA) 108 (90 BASE) MCG/ACT inhaler Inhale 2 puffs into the lungs every 4 (four) hours as needed for wheezing. Qty: 18 g, Refills: 0  ARMOUR THYROID 90 MG tablet Take 90 mg by mouth every morning. Refills: 6    Artificial Tear Solution (BION TEARS OP) Place 1 drop into both eyes daily as needed (dry eyes).    cetirizine (ZYRTEC) 10 MG tablet Take 10 mg by mouth See admin instructions. Take 1 tablet (10 mg) by mouth daily during allergy season    HYDROcodone-acetaminophen (NORCO/VICODIN) 5-325 MG tablet Take 0.5-1 tablets by mouth 2 (two) times daily as needed (pain). Qty: 60 tablet, Refills: 0    losartan-hydrochlorothiazide (HYZAAR) 100-12.5 MG per tablet Take 1 tablet by mouth daily. Qty: 90 tablet, Refills: 3    Methylcobalamin (METHYL B-12 PO) Take 400 mcg by mouth daily.    nitroGLYCERIN (NITROLINGUAL) 0.4 MG/SPRAY spray Place 1 spray under the tongue every 5 (five) minutes x 3 doses as needed for chest pain. Qty: 12 g, Refills: 12    polyethylene glycol (MIRALAX / GLYCOLAX) packet Take 17 g by mouth daily. Mix in 8 oz liquid and drink    triamcinolone cream (KENALOG) 0.1 % Apply 1 application topically daily as needed (rash).      STOP taking these medications     econazole nitrate 1 % cream          Aspirin prescribed at discharge? Yes High Intensity Statin Prescribed?No, patient is intolerant to statin Beta Blocker Prescribed? Yes For EF 45% or less, Was ACEI/ARB Prescribed? No, EF normal   ADP Receptor Inhibitor Prescribed? Yes, Plavix For EF <40%, Aldosterone Inhibitor Prescribed? No Was EF assessed during THIS hospitalization? No  Was Cardiac Rehab II  ordered? Yes   Outstanding Labs/Studies    Duration of Discharge Encounter   Greater than 30 minutes including physician time.  Signed, Arbutus Leas NP 10/10/2015, 11:56 AM

## 2015-10-10 NOTE — Progress Notes (Addendum)
Patient Name: [name] Date of Encounter: 10/10/2015  Principal Problem:   Angina pectoris [facility]) Active Problems:   Coronary artery disease   Hypertension   Hyperlipemia   Primary Cardiologist: Dr. Martinique Patient Profile: [name] is a 82 year old male with history of CAD, carotid disease, HTN, HLD, and TIA.  He underwent cardiac catheterization yesterday and got a DES to proximal RCA.    SUBJECTIVE: He had some chest tightness overnight that he says lasted a few hours.  States that its hard for him to say if it was "anxiety or actual pain".  He denies SOB, but feels like he cannot breathe very deeply all the time.  No diaphoresis.    OBJECTIVE Filed Vitals:   10/09/15 1935 10/09/15 2000 10/09/15 2100 10/10/15 0319  BP: 157/57 150/56 146/58 120/49  Pulse: 74 69 66 62  Temp: 97.6 F (36.4 C)   99.4 F (37.4 C)  TempSrc: Oral   Oral  Resp: 20 21 20 22   Height:      Weight:    213 lb 13.5 oz (97 kg)  SpO2: 97% 96% 97% 94%    Intake/Output Summary (Last 24 hours) at 10/10/15 0744 Last data filed at 10/09/15 2354  Gross per 24 hour  Intake    970 ml  Output    900 ml  Net     70 ml   Filed Weights   10/09/15 0625 10/10/15 0319  Weight: 213 lb (96.616 kg) 213 lb 13.5 oz (97 kg)    PHYSICAL EXAM General: Well developed, well nourished, male in no acute distress. Head: Normocephalic, atraumatic.  Neck: Supple without bruits, No JVD. Lungs:  Resp regular and unlabored, CTA. Heart: RRR, S1, S2, no S3, S4, or murmur; no rub. Abdomen: Soft, non-tender, non-distended, BS + x 4.  Extremities: No clubbing, cyanosis, +1 edema BLE.  Neuro: Alert and oriented X 3. Moves all extremities spontaneously. Psych: Normal affect.  LABS: CBC: Recent Labs  10/10/15 0602  WBC 4.8  HGB 12.7*  HCT 36.7*  MCV 96.3  PLT PENDING   Basic Metabolic Panel: Recent Labs  10/10/15 0602  NA 139  K 4.3  CL 105  CO2 27  GLUCOSE 101*  BUN 14  CREATININE 1.31*  CALCIUM 8.8*    BNP: No results found for: BNP PRO B NATRIURETIC PEPTIDE (BNP)  Date/Time Value Ref Range Status  09/15/2013 03:45 AM 188.4 0 - 450 pg/mL Final     Current facility-administered medications:  .  0.9 %  sodium chloride infusion, 250 mL, Intravenous, PRN, Peter M Martinique, MD .  acetaminophen (TYLENOL) tablet 650 mg, 650 mg, Oral, Q4H PRN, Peter M Martinique, MD .  aspirin chewable tablet 81 mg, 81 mg, Oral, Daily, Peter M Martinique, MD, 81 mg at 10/09/15 1021 .  carvedilol (COREG) tablet 3.125 mg, 3.125 mg, Oral, BID WC, Peter M Martinique, MD, 3.125 mg at 10/09/15 1902 .  clopidogrel (PLAVIX) tablet 75 mg, 75 mg, Oral, Q breakfast, Peter M Martinique, MD .  cyanocobalamin tablet 500 mcg, 500 mcg, Oral, Daily, Skeet Simmer, RPH .  ezetimibe (ZETIA) tablet 10 mg, 10 mg, Oral, Daily, Peter M Martinique, MD, 10 mg at 10/09/15 1851 .  losartan (COZAAR) tablet 100 mg, 100 mg, Oral, Daily **AND** hydrochlorothiazide (MICROZIDE) capsule 12.5 mg, 12.5 mg, Oral, Daily, Skeet Simmer, RPH .  ondansetron [facility]) injection 4 mg, 4 mg, Intravenous, Q6H PRN, Peter M Martinique, MD .  sodium chloride  flush (NS) 0.9 % injection 3 mL, 3 mL, Intravenous, Q12H, Peter M Martinique, MD, 10 mL at 10/09/15 1902 .  sodium chloride flush (NS) 0.9 % injection 3 mL, 3 mL, Intravenous, PRN, Peter M Martinique, MD .  thyroid [facility]) tablet 90 mg, 90 mg, Oral, QAC breakfast, Peter M Martinique, MD, 90 mg at 10/09/15 1107    TELE:        ECG:  Coronary Stent Intervention    Intravascular Pressure Wire/FFR Study   Left Heart Cath and Coronary Angiography   Mid RCA lesion, 40% stenosed.  Prox LAD to Mid LAD lesion, 20% stenosed.  Ost 1st Mrg to 1st Mrg lesion, 40% stenosed.  The left ventricular systolic function is normal.  Prox RCA lesion, 70% stenosed. Post intervention, there is a 0% residual stenosis.  1. Single vessel obstructive CAD 2. Abnormal FFR of the proximal RCA lesion 3. Normal LV function 4. Successful stenting of the  proximal RCA with a DES    Current Medications:  . aspirin  81 mg Oral Daily  . carvedilol  3.125 mg Oral BID WC  . clopidogrel  75 mg Oral Q breakfast  . vitamin B-12  500 mcg Oral Daily  . ezetimibe  10 mg Oral Daily  . losartan  100 mg Oral Daily   And  . hydrochlorothiazide  12.5 mg Oral Daily  . sodium chloride flush  3 mL Intravenous Q12H  . thyroid  90 mg Oral QAC breakfast      ASSESSMENT AND PLAN: Principal Problem:   Angina pectoris ([name]) Active Problems:   Coronary artery disease   Hypertension   Hyperlipemia   1. CAD s/p PCI - DES to proximal RCA yesterday.  Cath report above.  DAPT therapy for one year. Radial site is stable, no hematoma.  He had some chest tightness over night, EKG reviewed from last pm, no changes from previous EKG.  No residual significant CAD on cath.  He will walk with cardiac rehab this morning.  We can increase his Coreg to help reduce angina.    2. HLD- He has previously been intolerant of statins.  He was started on Zetia.  Last LDL in Sept. 2016 was 51, but he was on a statin at that time. Would recommend that he have lipids rechecked after 3 months of Zetia.  3. HTN- SBP's in 150's over night, would recommend increasing BB as he was having chest discomfort last night.  Cr is 1.31, this is his baseline.      Signed,  Arbutus Leas , NP 7:44 AM 10/10/2015 Pager [phone]   Patient seen and examined. Agree with assessment and plan. No recurrent chest pain with walking.  Will increase coreg to 6.25 mg bid for improved BB dosing with hr IN THE 80'S.  Dc today; F/U Dr. Martinique.   Troy Sine, MD, [facility] 10/10/2015 9:06 AM

## 2015-10-17 ENCOUNTER — Encounter: Payer: Self-pay | Admitting: Physician Assistant

## 2015-10-17 ENCOUNTER — Ambulatory Visit (INDEPENDENT_AMBULATORY_CARE_PROVIDER_SITE_OTHER): Payer: Medicare Other | Admitting: Physician Assistant

## 2015-10-17 VITALS — BP 140/72 | HR 56 | Ht 66.0 in | Wt 212.0 lb

## 2015-10-17 DIAGNOSIS — R5383 Other fatigue: Secondary | ICD-10-CM | POA: Diagnosis not present

## 2015-10-17 DIAGNOSIS — I25118 Atherosclerotic heart disease of native coronary artery with other forms of angina pectoris: Secondary | ICD-10-CM | POA: Diagnosis not present

## 2015-10-17 DIAGNOSIS — I10 Essential (primary) hypertension: Secondary | ICD-10-CM

## 2015-10-17 DIAGNOSIS — R400 Somnolence: Secondary | ICD-10-CM

## 2015-10-17 MED ORDER — LOSARTAN POTASSIUM-HCTZ 100-12.5 MG PO TABS: 1.0000 | ORAL_TABLET | Freq: Every evening | ORAL | Status: DC

## 2015-10-17 NOTE — Progress Notes (Signed)
Cardiology Office Note   Date:  10/17/2015   ID:  [name], DOB 01/10/1934, MRN [id number]  PCP:  Zachary Blinks, MD  Cardiologist:  Dr Zachary  Lael Pilch, PA-C   Chief Complaint  Patient presents with  . Follow-up    Dizzy and loss of balance, chronic, some cramping in calves    History of Present Illness: [name] is a 82 y.o. male with a history of mod CAD, had been treated medically. Also hx GERD, HTN, HLD, TIA, L-ICA 100%.    Admitted for CP, s/p cath w/ DES RCA, d/c 03/30  [name] presents for Post hospital follow-up.  Since discharge from the hospital, [name] has done fairly well. He has not had any chest pain. He is trying to increase his activity but has some physiological problems with this. He states he will keep trying.  He describes orthopnea when he lies down at night and says he has to sleep on his side. He does not require this at the bed to be elevated and feels that the orthopnea secondary to abdominal size.  He has a long history of periodic weakness. He will feel like his feet are in concrete blocks. This lasts one or 2 days. He will get it at least once a week. He has never checked his heart rate when he was trying to ambulate during one of these episodes, but states he was hospitalized one time for this and they did not find any cause.  He had one episode where his heart rate was elevated at night. He got up and drank cold water and then went back to bed and felt better. He did not recheck his heart rate.  He has had multiple episodes of a feeling of extreme fatigue and sleepiness after breakfast, even a small meal. The symptoms resolve with a nap, but for now is over an hour. He has not checked his blood pressure or heart rate during one of these episodes. He takes his morning medications after breakfast, except for the thyroid medication which he takes 30 minutes to an hour before eating.  He has ongoing problems with leg weakness and  balance difficulties.   Past Medical History  Diagnosis Date  . Diverticulitis     recurrent  . Tinnitus   . Hearing aid worn   . Kidney stone     lithotripsy [id number] w complications, req stents, Dr Rosana Hoes  . Prostate CA ([place])   . Complication of anesthesia     " had tremors after prostate surgery  . Family history of anesthesia complication     MOTHER   . Coronary artery disease   . Anginal pain ([place])   . Hypertension   . Hyperlipemia   . Anxiety     " OCCASIONAL"  . Asthma due to seasonal allergies     uses inhalers prn  . Shortness of breath   . TIA (transient ischemic attack)   . GERD (gastroesophageal reflux disease)   . H/O hiatal hernia   . Arthritis   . Carotid artery occlusion     Left    Past Surgical History  Procedure Laterality Date  . Carotid endarterectomy  Left    1998  . Prostatectomy  1998    radical for prostate cancer  . Cholecystectomy    . Left heart catheterization with coronary angiogram N/A 09/15/2013    Procedure: LEFT HEART CATHETERIZATION WITH CORONARY ANGIOGRAM;  Surgeon: Peter M Martinique, MD;  Location: [place] CATH [facility];  Service: Cardiovascular;  Laterality: N/A;  . Coronary stent placement  10/09/2015    DES to RCA  . Cardiac catheterization  2008    minimal dz, Dr Cathie Olden  . Cardiac catheterization N/A 10/09/2015    Procedure: Left Heart Cath and Coronary Angiography;  Surgeon: Peter M Martinique, MD;  Location: [place] CV LAB;  Service: Cardiovascular;  Laterality: N/A;  . Cardiac catheterization N/A 10/09/2015    Procedure: Intravascular Pressure Wire/FFR Study;  Surgeon: Peter M Martinique, MD;  Location: [place] CV LAB;  Service: Cardiovascular;  Laterality: N/A;  . Cardiac catheterization N/A 10/09/2015    Procedure: Coronary Stent Intervention;  Surgeon: Peter M Martinique, MD;  Location: [place] CV LAB;  Service: Cardiovascular;  Laterality: N/A;    Current Outpatient Prescriptions  Medication Sig Dispense Refill  . albuterol (PROVENTIL  HFA) 108 (90 BASE) MCG/ACT inhaler Inhale 2 puffs into the lungs every 4 (four) hours as needed for wheezing. 18 g 0  . ARMOUR THYROID 90 MG tablet Take 90 mg by mouth every morning.  6  . Artificial Tear Solution (BION TEARS OP) Place 1 drop into both eyes daily as needed (dry eyes).    Marland Kitchen aspirin 81 MG tablet Take 1 tablet (81 mg total) by mouth daily. 30 tablet 12  . beclomethasone (QVAR) 40 MCG/ACT inhaler Inhale into the lungs.    . carvedilol (COREG) 6.25 MG tablet Take 1 tablet (6.25 mg total) by mouth 2 (two) times daily with a meal. 180 tablet 3  . cetirizine (ZYRTEC) 10 MG tablet Take 10 mg by mouth See admin instructions. Take 1 tablet (10 mg) by mouth daily during allergy season    . clonazePAM (KLONOPIN) 0.5 MG tablet Take by mouth.    . clopidogrel (PLAVIX) 75 MG tablet Take 1 tablet (75 mg total) by mouth daily with breakfast. 90 tablet 3  . ezetimibe (ZETIA) 10 MG tablet Take 1 tablet (10 mg total) by mouth daily. 90 tablet 3  . HYDROcodone-acetaminophen (NORCO/VICODIN) 5-325 MG tablet Take 0.5-1 tablets by mouth 2 (two) times daily as needed (pain). 60 tablet 0  . losartan-hydrochlorothiazide (HYZAAR) 100-12.5 MG per tablet Take 1 tablet by mouth daily. 90 tablet 3  . nitroGLYCERIN (NITROLINGUAL) 0.4 MG/SPRAY spray Place 1 spray under the tongue every 5 (five) minutes x 3 doses as needed for chest pain. 12 g 12  . polyethylene glycol (MIRALAX / GLYCOLAX) packet Take 17 g by mouth daily. Mix in 8 oz liquid and drink    . triamcinolone cream (KENALOG) 0.1 % Apply 1 application topically daily as needed (rash).     No current facility-administered medications for this visit.    Allergies:   Ciprofloxacin; Codeine; Flexeril; Methocarbamol; Sulfonamide derivatives; and Flagyl    Social History:  The patient  reports that he quit smoking about 30 years ago. His smoking use included Cigarettes. He has a 75 pack-year smoking history. He has never used smokeless tobacco. He reports that  he drinks about 1.2 oz of alcohol per week. He reports that he does not use illicit drugs.   Family History:  The patient's family history includes CAD in his child; Dementia in his sister; Hashimoto's thyroiditis in his child; Heart attack in his sister; Heart disease in his father and mother; Kidney failure in his father; Sjogren's syndrome in his child.    ROS:  Please see the history of present illness. All other systems are reviewed and negative.  PHYSICAL EXAM: VS:  BP 140/72 mmHg  Pulse 56  Ht 5\' 6"  (1.676 m)  Wt 212 lb (96.163 kg)  BMI 34.23 kg/m2 , BMI Body mass index is 34.23 kg/(m^2). GEN: Well nourished, well developed, male in no acute distress HEENT: normal for age  Neck: no JVD, faint right carotid bruit, no masses Cardiac: RRR; no murmur, no rubs, or gallops Respiratory:  clear to auscultation bilaterally, normal work of breathing GI: soft, nontender, nondistended, + BS MS: no deformity or atrophy; no edema; distal pulses are 2+ in all 4 extremities  Skin: warm and dry, no rash Neuro:  Strength and sensation are intact Psych: euthymic mood, full affect   EKG:  EKG is ordered today. The ekg ordered today demonstrates sinus bradycardia, heart rate 56 with a right bundle branch block which is old   Recent Labs: 09/25/2015: TSH 1.12 10/10/2015: BUN 14; Creatinine, Ser 1.31*; Hemoglobin 12.7*; Platelets 100*; Potassium 4.3; Sodium 139    Lipid Panel    Component Value Date/Time   CHOL 108 03/24/2015 1159   TRIG 119 03/24/2015 1159   HDL 33* 03/24/2015 1159   CHOLHDL 3.3 03/24/2015 1159   VLDL 24 03/24/2015 1159   LDLCALC 51 03/24/2015 1159   LDLDIRECT 70 12/21/2012 1015     Wt Readings from Last 3 Encounters:  10/17/15 212 lb (96.163 kg)  10/10/15 213 lb 13.5 oz (97 kg)  10/02/15 213 lb 9.6 oz (96.888 kg)     Other studies Reviewed: Additional studies/ records that were reviewed today include: Hospital records and office notes.  ASSESSMENT AND  PLAN:  1.  CAD: He is not having any ongoing ischemic symptoms. He is compliant with his medications. He is tolerating the aspirin and Plavix well. His heart rate is 56 in the office today, but he is asymptomatic. No medication changes at this time.  2. Somnolence after breakfast, and episodic fatigue: We will change his Hyzaar to a p.m. medication. He is encouraged to check his heart rate/BP whenever he is having symptoms to see if bradycardia or hypotension is contributing to them.  3. Leg weakness and balance difficulties: He is encouraged to follow-up with his primary care physician for this. According to the patient, he has never been fully evaluated.   Current medicines are reviewed at length with the patient today.  The patient does not have concerns regarding medicines.  The following changes have been made:  Change the Hyzaar to an evening medication  Labs/ tests ordered today include:   Orders Placed This Encounter  Procedures  . EKG 12-Lead     Disposition:   FU with Dr. Martinique  Signed, Rosaria Ferries, PA-C  10/17/2015 12:13 PM    [place] Phone: [phone]; Fax: [phone]  This note was written with the assistance of speech recognition software. Please excuse any transcriptional errors.

## 2015-10-17 NOTE — Patient Instructions (Addendum)
Medication Instructions:  Your physician recommends that you continue on your current medications as directed. Please refer to the Current Medication list given to you today. You can start taking the Hyzaar at night time  Labwork: None ordered  Testing/Procedures: None ordered  Follow-Up: Your physician recommends that you schedule a follow-up appointment in: 3 MONTHS WITH DR. Martinique  Please make sure you follow up with Dr. Edilia Bo   Any Other Special Instructions Will Be Listed Below (If Applicable).  You need to limit your carbohydrate intake with your diet.  If you need a refill on your cardiac medications before your next appointment, please call your pharmacy.

## 2015-11-04 ENCOUNTER — Telehealth: Payer: Self-pay | Admitting: Cardiology

## 2015-11-04 NOTE — Telephone Encounter (Signed)
[name] is calling to get the referring diagnosis code for the pt to begin Cardiac Rehab. The fax number is [phone].   Thanks

## 2015-11-04 NOTE — Telephone Encounter (Signed)
Verified ICD 10 code for CAD (I25.10) for pt to start cardiac rehab at [facility].  Pt last EKG faxed to their office.

## 2016-01-20 ENCOUNTER — Ambulatory Visit (INDEPENDENT_AMBULATORY_CARE_PROVIDER_SITE_OTHER): Payer: Medicare Other | Admitting: Cardiology

## 2016-01-20 ENCOUNTER — Encounter: Payer: Self-pay | Admitting: Cardiology

## 2016-01-20 VITALS — BP 129/69 | HR 55 | Ht 66.0 in | Wt 209.6 lb

## 2016-01-20 DIAGNOSIS — I10 Essential (primary) hypertension: Secondary | ICD-10-CM | POA: Diagnosis not present

## 2016-01-20 DIAGNOSIS — I25118 Atherosclerotic heart disease of native coronary artery with other forms of angina pectoris: Secondary | ICD-10-CM

## 2016-01-20 DIAGNOSIS — E785 Hyperlipidemia, unspecified: Secondary | ICD-10-CM | POA: Diagnosis not present

## 2016-01-20 NOTE — Progress Notes (Signed)
[name] Date of Birth: 04-25-1934 Medical Record [id number]  History of Present Illness: [name] is seen for follow up CAD.  He has a known history of CAD from evaluation in 2015. He was  hospitalized with atypical chest pain. He ruled out for MI. Cardiac cath was done and demonstrated a 70% stenosis of the proximal RCA. FFR was 0.82. It was felt that this lesion was not flow limiting and he was treated medically. In March he presented with class 3 angina despite good medical therapy. Cardiac cath repeated and showed a 70% proximal RCA stenosis. FFR at this time was abnormal at 0.76 and the lesion was stented with DES.   Since then he has done well. He still notes mild chest pressure if he walks up hill or stairs. He is actively participating in [place]. He feels his energy level and strength are improved. He is planning to continue regular exercise post graduation.  He was admitted in September 2016 with generalized weakness and arthralgias. This was felt to be related to statins and this was stopped. He is now on Zetia. He is on thyroid replacement now. He is followed by [name] for carotid disease and is s/p left CEA. Evaluation in Dec 2016 showed an 70-80% right ICA stenosis. This is being followed closely.   Current Outpatient Prescriptions on File Prior to Visit  Medication Sig Dispense Refill  . albuterol (PROVENTIL HFA) 108 (90 BASE) MCG/ACT inhaler Inhale 2 puffs into the lungs every 4 (four) hours as needed for wheezing. 18 g 0  . ARMOUR THYROID 90 MG tablet Take 90 mg by mouth every morning.  6  . Artificial Tear Solution (BION TEARS OP) Place 1 drop into both eyes daily as needed (dry eyes).    Marland Kitchen aspirin 81 MG tablet Take 1 tablet (81 mg total) by mouth daily. 30 tablet 12  . beclomethasone (QVAR) 40 MCG/ACT inhaler Inhale into the lungs.    . carvedilol (COREG) 6.25 MG tablet Take 1 tablet (6.25 mg total) by mouth 2 (two) times daily with a meal. 180 tablet 3  .  cetirizine (ZYRTEC) 10 MG tablet Take 10 mg by mouth See admin instructions. Take 1 tablet (10 mg) by mouth daily during allergy season    . clonazePAM (KLONOPIN) 0.5 MG tablet Take by mouth.    . clopidogrel (PLAVIX) 75 MG tablet Take 1 tablet (75 mg total) by mouth daily with breakfast. 90 tablet 3  . ezetimibe (ZETIA) 10 MG tablet Take 1 tablet (10 mg total) by mouth daily. 90 tablet 3  . HYDROcodone-acetaminophen (NORCO/VICODIN) 5-325 MG tablet Take 0.5-1 tablets by mouth 2 (two) times daily as needed (pain). 60 tablet 0  . losartan-hydrochlorothiazide (HYZAAR) 100-12.5 MG tablet Take 1 tablet by mouth every evening. 90 tablet 3  . nitroGLYCERIN (NITROLINGUAL) 0.4 MG/SPRAY spray Place 1 spray under the tongue every 5 (five) minutes x 3 doses as needed for chest pain. 12 g 12  . polyethylene glycol (MIRALAX / GLYCOLAX) packet Take 17 g by mouth daily. Mix in 8 oz liquid and drink    . triamcinolone cream (KENALOG) 0.1 % Apply 1 application topically daily as needed (rash).     No current facility-administered medications on file prior to visit.    Allergies  Allergen Reactions  . Ciprofloxacin Other (See Comments)    Hallucinations or jittery  . Codeine Other (See Comments)    hallucinations  . Flexeril [Cyclobenzaprine]   . Methocarbamol   .  Sulfonamide Derivatives Other (See Comments)    Chills and shaking "serum sickness"  . Flagyl [Metronidazole] Rash    Past Medical History  Diagnosis Date  . Diverticulitis     recurrent  . Tinnitus   . Hearing aid worn   . Kidney stone     lithotripsy [id number] w complications, req stents, [name]  . Prostate CA ([place])   . Complication of anesthesia     " had tremors after prostate surgery  . Family history of anesthesia complication     MOTHER   . Coronary artery disease   . Anginal pain ([place])   . Hypertension   . Hyperlipemia   . Anxiety     " OCCASIONAL"  . Asthma due to seasonal allergies     uses inhalers prn  . Shortness of  breath   . TIA (transient ischemic attack)   . GERD (gastroesophageal reflux disease)   . H/O hiatal hernia   . Arthritis   . Carotid artery occlusion     Left    Past Surgical History  Procedure Laterality Date  . Carotid endarterectomy  Left    1998  . Prostatectomy  1998    radical for prostate cancer  . Cholecystectomy    . Left heart catheterization with coronary angiogram N/A 09/15/2013    Procedure: LEFT HEART CATHETERIZATION WITH CORONARY ANGIOGRAM;  Surgeon: Zachary Zachary M Martinique, MD;  Location: [facility] CATH LAB;  Service: Cardiovascular;  Laterality: N/A;  . Coronary stent placement  10/09/2015    DES to RCA  . Cardiac catheterization  2008    minimal dz, [name]  . Cardiac catheterization N/A 10/09/2015    Procedure: Left Heart Cath and Coronary Angiography;  Surgeon: [name] M Martinique, MD;  Location: [facility] CV LAB;  Service: Cardiovascular;  Laterality: N/A;  . Cardiac catheterization N/A 10/09/2015    Procedure: Intravascular Pressure Wire/FFR Study;  Surgeon: Zachary Zachary M Martinique, MD;  Location: [facility] CV LAB;  Service: Cardiovascular;  Laterality: N/A;  . Cardiac catheterization N/A 10/09/2015    Procedure: Coronary Stent Intervention;  Surgeon: Zachary Zachary M Martinique, MD;  Location: [facility] CV LAB;  Service: Cardiovascular;  Laterality: N/A;    History  Smoking status  . Former Smoker -- 2.50 packs/day for 30 years  . Types: Cigarettes  . Quit date: 08/28/1985  Smokeless tobacco  . Never Used    History  Alcohol Use  . 1.2 oz/week  . 2 Standard drinks or equivalent per week    Comment: 1 glass wine/week (prior 1 glass martini with dinner)    Family History  Problem Relation Age of Onset  . Heart disease Mother   . Heart disease Father   . Kidney failure Father   . Heart attack Sister   . Dementia Sister   . Sjogren's syndrome Child   . Hashimoto's thyroiditis Child   . CAD Child     Review of Systems: As noted in HPI.  All other systems were reviewed and are  negative.  Physical Exam: There were no vitals taken for this visit. Obese WM in NAD HEENT: [place]/AT,PERRL, EOMI, oropharynx is clear.  Neck: supple without JVD, adenopathy, or bruits. Lungs: clear CV: RRR, normal S1-2, no gallop.  Soft 1/6 systolic murmur at the apex Abd: soft NT, BS +. No HSM or masses. Ext: no edema. Normal pulses.  Neuro: nonfocal.  LABORATORY DATA: Lab Results  Component Value Date   WBC 4.8 10/10/2015   HGB 12.7* 10/10/2015  HCT 36.7* 10/10/2015   PLT 100* 10/10/2015   GLUCOSE 101* 10/10/2015   CHOL 108 03/24/2015   TRIG 119 03/24/2015   HDL 33* 03/24/2015   LDLDIRECT 70 12/21/2012   LDLCALC 51 03/24/2015   ALT 15 12/21/2012   AST 19 12/21/2012   NA 139 10/10/2015   K 4.3 10/10/2015   CL 105 10/10/2015   CREATININE 1.31* 10/10/2015   BUN 14 10/10/2015   CO2 27 10/10/2015   TSH 1.12 09/25/2015   PSA <0.01 03/25/2015   INR 1.04 10/02/2015   HGBA1C 5.4 03/24/2015     Assessment / Plan: 1. CAD with moderate proximal RCA stenosis in 2015. Repeat cardiac cath in March 2017 showed 70% lesion with abnormal FFR and he underwent successful stenting with DES. Plan DAPT for one year. Now with class 1 angina.Continue exercise program and weight loss.   2. Dyslipidemia. Statin intolerant. On Zetia. Plan to repeat lipid panel.  3. HTN controlled.  4. Carotid arterial disease s/p LCEA. 80% RICA stenosis. Asymptomatic. Followed at [facility].   I will follow up in 6 months.

## 2016-01-20 NOTE — Patient Instructions (Signed)
We will schedule you for fasting lab work  Continue your current therapy  I will see you in 6 months.   

## 2016-01-29 LAB — HEPATIC FUNCTION PANEL
ALBUMIN: 4.2 g/dL (ref 3.5–4.7)
ALT: 26 IU/L (ref 0–44)
AST: 23 IU/L (ref 0–40)
Alkaline Phosphatase: 60 IU/L (ref 39–117)
BILIRUBIN TOTAL: 1.2 mg/dL (ref 0.0–1.2)
Bilirubin, Direct: 0.26 mg/dL (ref 0.00–0.40)
TOTAL PROTEIN: 6.9 g/dL (ref 6.0–8.5)

## 2016-01-29 LAB — LIPID PANEL
CHOL/HDL RATIO: 3.8 ratio (ref 0.0–5.0)
Cholesterol, Total: 152 mg/dL (ref 100–199)
HDL: 40 mg/dL (ref >39)
LDL CALC: 82 mg/dL (ref 0–99)
Triglycerides: 150 mg/dL — ABNORMAL HIGH (ref 0–149)
VLDL Cholesterol Cal: 30 mg/dL (ref 5–40)

## 2016-01-29 LAB — BASIC METABOLIC PANEL
BUN / CREAT RATIO: 13 (ref 10–24)
BUN: 19 mg/dL (ref 8–27)
CO2: 24 mmol/L (ref 18–29)
CREATININE: 1.5 mg/dL — AB (ref 0.76–1.27)
Calcium: 9.2 mg/dL (ref 8.6–10.2)
Chloride: 104 mmol/L (ref 96–106)
GFR calc Af Amer: 50 mL/min/{1.73_m2} — ABNORMAL LOW (ref >59)
GFR, EST NON AFRICAN AMERICAN: 43 mL/min/{1.73_m2} — AB (ref >59)
Glucose: 102 mg/dL — ABNORMAL HIGH (ref 65–99)
POTASSIUM: 4.6 mmol/L (ref 3.5–5.2)
SODIUM: 143 mmol/L (ref 134–144)

## 2016-02-12 ENCOUNTER — Ambulatory Visit (INDEPENDENT_AMBULATORY_CARE_PROVIDER_SITE_OTHER): Payer: Medicare Other | Admitting: Family Medicine

## 2016-02-12 ENCOUNTER — Ambulatory Visit: Payer: Medicare Other | Admitting: Vascular Surgery

## 2016-02-12 ENCOUNTER — Encounter (HOSPITAL_COMMUNITY): Payer: Medicare Other

## 2016-02-12 VITALS — BP 130/82 | HR 61 | Temp 97.8°F | Ht 66.0 in | Wt 211.6 lb

## 2016-02-12 DIAGNOSIS — I6529 Occlusion and stenosis of unspecified carotid artery: Secondary | ICD-10-CM | POA: Diagnosis not present

## 2016-02-12 DIAGNOSIS — I10 Essential (primary) hypertension: Secondary | ICD-10-CM

## 2016-02-12 DIAGNOSIS — R42 Dizziness and giddiness: Secondary | ICD-10-CM | POA: Diagnosis not present

## 2016-02-12 DIAGNOSIS — I779 Disorder of arteries and arterioles, unspecified: Secondary | ICD-10-CM

## 2016-02-12 DIAGNOSIS — I739 Peripheral vascular disease, unspecified: Secondary | ICD-10-CM

## 2016-02-12 MED ORDER — LOSARTAN POTASSIUM 100 MG PO TABS: 100.0000 mg | ORAL_TABLET | Freq: Every day | ORAL | 3 refills | Status: DC

## 2016-02-12 NOTE — Patient Instructions (Addendum)
Let's change you to just losartan instead of the losartan/ hctz. We will stick with 100 mg of losartan for you.   If you continue to have symptoms we can change back to the 3 mg of coreg twice a day if Dr. Martinique says ok Go ahead and get your carotid scan done asap  Please let me know if you have any change or worsening of your symptoms in the meantime!  If you have any episodes of particular weakness, slurred speech or acute mental changes you might be having a stroke; please go to the ER if this happens.

## 2016-02-12 NOTE — Progress Notes (Signed)
[facility] at [facility] [address] [place], [place] [address] [phone]  Date:  02/12/2016   Name:  [name]   DOB:  January 19, 1934   MRN:  [id number]  PCP:  Lamar Blinks, MD    Chief Complaint: Follow-up (Pt here to discuss dizziness and weakness that has been present for 1 yr. Pt states that he has noticed an increase over the last few months of these sx's. )   History of Present Illness:  [name] is a 82 y.o. very pleasant male patient who presents with the following:  I saw this pt as a new pt/ CPE in March of this year.  He has CAD which was being managed medically; shortly after our last visit he got admitted to the hospital with CP and underwent a DES to the RCA, and he did a cardiac rehab program for 12 weeks.  He then went to transition to a silver sneakers program just last week; however during his orientation/ introduction he felt very weak/ like he might pass out and had to stop. His trainer was concerned and asked him to see his doctor He did not have any chest pain- he just felt weak and lightheaded. He did not have any particular weakness of any part of his body, no slurred speech or facial drooping. He does not have vertigo.   He has "loss of balance" but this has been stable for some time.  He did work on some vestibular rehab while he was in cardiac rehab.    Neurologist- Dr. Carles Collet- for balance, dizziness, memory concerns.  This all started back in 2016 when he was admitted for extreme weakness, "I could not walk", these sx came on him suddenly and then just as suddenly resolved after about 24 hours. They stopped his statin and some of his BP medications at that time.  Since then he has continued to have episodes like the one that put him in the hospital, albeit less severe   CT of his head at that time: IMPRESSION: No acute intracranial abnormalities. Chronic atrophy and small vessel ischemic changes.  He has  60- 79% stenosis in the right carotid which is under observation by Dr. Scot Dock- vascular surgery [facility]. His follow-up visit is in September. He did have surgery for his LEFT carotid about 20 years ago.  He plans to have a follow-up right carotid US soon but is not sure if he would ever really consider surgery  Good appetite but he is trying to follow a healthy diet.   No fever, chills, belly pain.  He will have occasional pain in the right temple that he noted this week when he lies down.  He describes this as a throb that lasts just a split second. No vision change.   He did have a HA yesterday but now resolved.   They did increase his dose of Coreg from 3.25 to 6.25 in March of this year.  He is not sure if this could have contributed to   BP Readings from Last 3 Encounters:  02/12/16 130/82  01/20/16 129/69  10/17/15 140/72   Pulse Readings from Last 3 Encounters:  02/12/16 61  01/20/16 (!) 55  10/17/15 (!) 56     Office Visit on 01/20/2016  Component Date Value Ref Range Status  . Glucose 01/29/2016 102* 65 - 99 mg/dL Final  . BUN 01/29/2016 19  8 - 27 mg/dL Final  . Creatinine, Ser 01/29/2016 1.50* 0.76 - 1.27 mg/dL Final  . GFR calc non Af Amer 01/29/2016 43* >59 mL/min/1.73 Final  . GFR calc Af Amer 01/29/2016 50* >59 mL/min/1.73 Final  . BUN/Creatinine Ratio 01/29/2016 13  10 - 24 Final  . Sodium 01/29/2016 143  134 - 144 mmol/L Final  . Potassium 01/29/2016 4.6  3.5 - 5.2 mmol/L Final  . Chloride 01/29/2016 104  96 - 106 mmol/L Final  . CO2 01/29/2016 24  18 - 29 mmol/L Final  . Calcium 01/29/2016 9.2  8.6 - 10.2 mg/dL Final  . Cholesterol, Total 01/29/2016 152  100 - 199 mg/dL Final  . Triglycerides 01/29/2016 150* 0 - 149 mg/dL Final  . HDL 01/29/2016 40  >39 mg/dL Final  . VLDL Cholesterol Cal 01/29/2016 30  5 - 40 mg/dL Final  . LDL Calculated 01/29/2016 82  0 - 99 mg/dL Final  . Chol/HDL Ratio 01/29/2016 3.8  0.0 - 5.0  ratio units Final   Comment:                                   T. Chol/HDL Ratio                                             Men  Women                               1/2 Avg.Risk  3.4    3.3                                   Avg.Risk  5.0    4.4                                2X Avg.Risk  9.6    7.1                                3X Avg.Risk 23.4   11.0   . Total Protein 01/29/2016 6.9  6.0 - 8.5 g/dL Final  . Albumin 01/29/2016 4.2  3.5 - 4.7 g/dL Final  . Bilirubin Total 01/29/2016 1.2  0.0 - 1.2 mg/dL Final  . Bilirubin, Direct 01/29/2016 0.26  0.00 - 0.40 mg/dL Final  . Alkaline Phosphatase 01/29/2016 60  39 - 117 IU/L Final  . AST 01/29/2016 23  0 - 40 IU/L Final  . ALT 01/29/2016 26  0 - 44 IU/L Final     Patient Active Problem List   Diagnosis Date Noted  . Angina pectoris ([place]) 10/09/2015  . Coronary artery disease   . Hypertension   . Hyperlipemia   . Carotid artery occlusion   . Cerumen impaction 08/30/2015  . Dizziness 08/09/2015  . Gait instability 05/30/2015  . Myalgia   . Weakness   . Bloating   . Chest pain at rest 03/24/2015  . Atypical chest pain 03/24/2015  . Fatigue 03/14/2015  . Hypothyroidism 10/26/2013  . OSA (obstructive sleep apnea) 10/18/2013  . Well adult health check 01/02/2013  .  Other symptoms involving urinary system(788.99) 12/21/2012  . Benign localized hyperplasia of prostate with urinary obstruction and other lower urinary tract symptoms (LUTS)(600.21) 12/21/2012  . Carcinoma in situ of prostate 12/21/2012  . Left shoulder pain 08/08/2012  . Obesity 07/24/2011  . Generalized anxiety disorder 07/24/2011  . DIVERTICULOSIS OF COLON 12/25/2009  . DERMATITIS, SEBORRHEIC 12/25/2009  . 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 Memory loss 08/14/2009  . BACK PAIN, LUMBAR 07/19/2009  . PROSTATE CANCER 09/09/2006  . HYPERLIPIDEMIA 09/09/2006  . HYPERTENSION, BENIGN SYSTEMIC 09/09/2006  . Coronary atherosclerosis 09/09/2006  . VENOUS  INSUFFICIENCY, CHRONIC 09/09/2006  . GASTROESOPHAGEAL REFLUX, NO ESOPHAGITIS 09/09/2006  . HERNIA, HIATAL, NONCONGENITAL 09/09/2006  . INSOMNIA NOS 09/09/2006    Past Medical History:  Diagnosis Date  . Anginal pain ([place])   . Anxiety    " OCCASIONAL"  . Arthritis   . Asthma due to seasonal allergies    uses inhalers prn  . Carotid artery occlusion    Left  . Complication of anesthesia    " had tremors after prostate surgery  . Coronary artery disease   . Diverticulitis    recurrent  . Family history of anesthesia complication    MOTHER   . GERD (gastroesophageal reflux disease)   . H/O hiatal hernia   . Hearing aid worn   . Hyperlipemia   . Hypertension   . Kidney stone    lithotripsy [id number] w complications, req stents, Dr Rosana Hoes  . Prostate CA ([place])   . Shortness of breath   . TIA (transient ischemic attack)   . Tinnitus     Past Surgical History:  Procedure Laterality Date  . CARDIAC CATHETERIZATION  2008   minimal dz, Dr Cathie Olden  . CARDIAC CATHETERIZATION N/A 10/09/2015   Procedure: Left Heart Cath and Coronary Angiography;  Surgeon: Peter M Martinique, MD;  Location: [facility] CV LAB;  Service: Cardiovascular;  Laterality: N/A;  . CARDIAC CATHETERIZATION N/A 10/09/2015   Procedure: Intravascular Pressure Wire/FFR Study;  Surgeon: Peter M Martinique, MD;  Location: [facility] CV LAB;  Service: Cardiovascular;  Laterality: N/A;  . CARDIAC CATHETERIZATION N/A 10/09/2015   Procedure: Coronary Stent Intervention;  Surgeon: Peter M Martinique, MD;  Location: [facility] CV LAB;  Service: Cardiovascular;  Laterality: N/A;  . CAROTID ENDARTERECTOMY  Left   1998  . CHOLECYSTECTOMY    . CORONARY STENT PLACEMENT  10/09/2015   DES to RCA  . LEFT HEART CATHETERIZATION WITH CORONARY ANGIOGRAM N/A 09/15/2013   Procedure: LEFT HEART CATHETERIZATION WITH CORONARY ANGIOGRAM;  Surgeon: Peter M Martinique, MD;  Location: [facility] CATH LAB;  Service: Cardiovascular;  Laterality: N/A;  . PROSTATECTOMY  1998    radical for prostate cancer    Social History  Substance Use Topics  . Smoking status: Former Smoker    Packs/day: 2.50    Years: 30.00    Types: Cigarettes    Quit date: 08/28/1985  . Smokeless tobacco: Never Used  . Alcohol use 1.2 oz/week    2 Standard drinks or equivalent per week     Comment: 1 glass wine/week (prior 1 glass martini with dinner)    Family History  Problem Relation Age of Onset  . Heart disease Mother   . Heart disease Father   . Kidney failure Father   . Heart attack Sister   . Dementia Sister   . Sjogren's syndrome Child   . Hashimoto's thyroiditis Child   . CAD Child     Allergies  Allergen Reactions  .  Ciprofloxacin Other (See Comments)    Hallucinations or jittery  . Codeine Other (See Comments)    hallucinations  . Flexeril [Cyclobenzaprine]   . Methocarbamol   . Sulfonamide Derivatives Other (See Comments)    Chills and shaking "serum sickness"  . Flagyl [Metronidazole] Rash    Medication list has been reviewed and updated.  Current Outpatient Prescriptions on File Prior to Visit  Medication Sig Dispense Refill  . albuterol (PROVENTIL HFA) 108 (90 BASE) MCG/ACT inhaler Inhale 2 puffs into the lungs every 4 (four) hours as needed for wheezing. 18 g 0  . ARMOUR THYROID 90 MG tablet Take 90 mg by mouth every morning.  6  . Artificial Tear Solution (BION TEARS OP) Place 1 drop into both eyes daily as needed (dry eyes).    Marland Kitchen aspirin 81 MG tablet Take 1 tablet (81 mg total) by mouth daily. 30 tablet 12  . beclomethasone (QVAR) 40 MCG/ACT inhaler Inhale into the lungs.    . carvedilol (COREG) 6.25 MG tablet Take 1 tablet (6.25 mg total) by mouth 2 (two) times daily with a meal. 180 tablet 3  . cetirizine (ZYRTEC) 10 MG tablet Take 10 mg by mouth See admin instructions. Take 1 tablet (10 mg) by mouth daily during allergy season    . clopidogrel (PLAVIX) 75 MG tablet Take 1 tablet (75 mg total) by mouth daily with breakfast. 90 tablet 3  .  ezetimibe (ZETIA) 10 MG tablet Take 1 tablet (10 mg total) by mouth daily. 90 tablet 3  . HYDROcodone-acetaminophen (NORCO/VICODIN) 5-325 MG tablet Take 0.5-1 tablets by mouth 2 (two) times daily as needed (pain). 60 tablet 0  . losartan-hydrochlorothiazide (HYZAAR) 100-12.5 MG tablet Take 1 tablet by mouth every evening. 90 tablet 3  . nitroGLYCERIN (NITROLINGUAL) 0.4 MG/SPRAY spray Place 1 spray under the tongue every 5 (five) minutes x 3 doses as needed for chest pain. 12 g 12  . polyethylene glycol (MIRALAX / GLYCOLAX) packet Take 17 g by mouth daily. Mix in 8 oz liquid and drink    . triamcinolone cream (KENALOG) 0.1 % Apply 1 application topically daily as needed (rash).     No current facility-administered medications on file prior to visit.     Review of Systems:  As per HPI- otherwise negative.   Physical Examination: Vitals:   02/12/16 0926  BP: 130/82  Pulse: 61  Temp: 97.8 F (36.6 C)   Vitals:   02/12/16 0926  Weight: 211 lb 9.6 oz (96 kg)  Height: 5\' 6"  (1.676 m)   Body mass index is 34.15 kg/m. Ideal Body Weight: Weight in (lb) to have BMI = 25: 154.6  GEN: WDWN, NAD, Non-toxic, A & O x 3, elderly man accompanied by his wife HEENT: Atraumatic, Normocephalic. Neck supple. No masses, No LAD. Ears and Nose: No external deformity. CV: RRR, No M/G/R. No JVD. No thrill. No extra heart sounds. PULM: CTA B, no wheezes, crackles, rhonchi. No retractions. No resp. distress. No accessory muscle use. ABD: S, NT, ND EXTR: No c/c/e NEURO slow and careful gait.  He has normal strength, sensation and DTR of all extremities,  Normal romberg PSYCH: Normally interactive. Conversant. Not depressed or anxious appearing.  Calm demeanor.    Assessment and Plan: Dizziness and giddiness  Essential hypertension - Plan: losartan (COZAAR) 100 MG tablet  Carotid artery disease without cerebral infarction [facility])  Here today to discuss concern of episodic weakness.  This is not a new  issue, but he had an  instance last week and wanted to be evaluated.  Despite thorough evaluation no cause for his sx were found when he was in the hospital last year.  He certainly is at high risk for stroke; we wonder if his sx might represent mini strokes.  His coreg dose was recently increased; we will try stopping his hctz and having him on only losartan as relative hypotension is another possible cause He will have his right carotid US done soon  See patient instructions for more details.     BP Readings from Last 3 Encounters:  02/12/16 130/82  01/20/16 129/69  10/17/15 140/72     Signed Lamar Blinks, MD

## 2016-02-14 ENCOUNTER — Telehealth: Payer: Self-pay | Admitting: Family Medicine

## 2016-02-14 NOTE — Telephone Encounter (Signed)
Called pt- he is feeling well today.  Yesterday he had a lot of pain in the SCM muscle on the right which he now thinks is due to recent extended dental work. He was going to come in but today the pain has gotten a lot better.  Advised him to seek care if it gets bad again and he will do so

## 2016-03-13 ENCOUNTER — Telehealth: Payer: Self-pay | Admitting: Cardiology

## 2016-03-13 NOTE — Telephone Encounter (Signed)
New message     Pt calling about the side effects of the meds he is taking. He has questions. Please call.

## 2016-03-13 NOTE — Telephone Encounter (Signed)
Returned call to patient.He stated he has been having dizziness and itching all over off and on since he started medications in 09/2015. No chest pain.No sob.Stated he saw Dr.Jordan 01/20/16 but forgot to mention to him.Stated he wants to know which medication would be causing.Appointment scheduled with Almyra Deforest PA 03/18/16 10:30 am at [facility] office.

## 2016-03-18 ENCOUNTER — Encounter: Payer: Self-pay | Admitting: Physician Assistant

## 2016-03-18 ENCOUNTER — Ambulatory Visit (INDEPENDENT_AMBULATORY_CARE_PROVIDER_SITE_OTHER): Payer: Medicare Other | Admitting: Physician Assistant

## 2016-03-18 VITALS — BP 160/82 | HR 54 | Ht 66.0 in | Wt 212.6 lb

## 2016-03-18 DIAGNOSIS — I739 Peripheral vascular disease, unspecified: Secondary | ICD-10-CM

## 2016-03-18 DIAGNOSIS — I10 Essential (primary) hypertension: Secondary | ICD-10-CM | POA: Diagnosis not present

## 2016-03-18 DIAGNOSIS — I2510 Atherosclerotic heart disease of native coronary artery without angina pectoris: Secondary | ICD-10-CM

## 2016-03-18 DIAGNOSIS — E785 Hyperlipidemia, unspecified: Secondary | ICD-10-CM

## 2016-03-18 DIAGNOSIS — R42 Dizziness and giddiness: Secondary | ICD-10-CM

## 2016-03-18 DIAGNOSIS — Z8546 Personal history of malignant neoplasm of prostate: Secondary | ICD-10-CM

## 2016-03-18 DIAGNOSIS — I6503 Occlusion and stenosis of bilateral vertebral arteries: Secondary | ICD-10-CM

## 2016-03-18 DIAGNOSIS — I779 Disorder of arteries and arterioles, unspecified: Secondary | ICD-10-CM

## 2016-03-18 DIAGNOSIS — E039 Hypothyroidism, unspecified: Secondary | ICD-10-CM

## 2016-03-18 MED ORDER — NITROGLYCERIN 0.4 MG/SPRAY TL SOLN: 1.0000 | 12 refills | Status: DC | PRN

## 2016-03-18 NOTE — Progress Notes (Signed)
Cardiology Office Note    Date:  03/18/2016   ID:  [name], DOB Aug 22, 1933, MRN [id number]  PCP:  Zachary Blinks, MD  Cardiologist:  [name]  Chief Complaint  Patient presents with  . Follow-up    seen for [name]  . Dizziness    discuss medications    History of Present Illness:  [name] is a 82 y.o. male with PMH of CAD, prostate CA s/p radical prostatectomy 1998, HTN, HLD, h/o carotid artery disease s/p L CEA 1998, and TIA. He was admitted to the hospital in 2015 for atypical chest pain, he was ruled out for MI. Cardiac catheterization demonstrated 70% stenosis in proximal RCA with FFR of 0.82, it was felt this lesion was not flow-limiting therefore he was treated medically. In March 2017, he presented with class III angina despite good medical therapy, repeat cardiac catheterization demonstrated a 70% proximal RCA with FFR of 0.76, this lesion was stented with drug-eluting stent. He was last seen by [name] on 01/20/2016 at which time he was doing well, he still notices some mild chest pressure while walking up a little or stairs, otherwise he was actively participating in cardiac rehabilitation. He did develop generalized weakness in September 2016 with arthralgias, this was attributed to statin and was stopped. Now he is on Zetia. He is also on thyroid replacement therapy for hypothyroidism.   Last carotid ultrasound on 06/18/2015 showed right ICA 60-79%, left ICA is 1-39%. Carotids CT was obtained on 06/26/2015 which showed 70% or greater stenosis of the right vertebral artery at its origin from subclavian, 70% stenosis in left vertebral artery at its origin, at least 80% stenosis of the proximal ICA. This was followed by Dr. Scot King. According to his last vascular office visit on 08/14/2015, he was having dizziness at the time, Dr. Scot King suggested that his vertebral artery stenosis could be responsible for his dizziness, however patient was convinced it was related to  inner ear problem. Dr. Lou King recommended if his workup with 6 month repeat carotid Doppler is unremarkable in the less as 80%, consideration could be given to cerebral angiography and possible vertebral angiography by Dr. Estanislado King.   He was recently seen by his PCP 02/12/2016, he was previously on Hyzaar (HCTZ/losartan), per PCP, plan to reduce/stop his HCTZ, and have him on losartan. He is also on carvedilol 6.25 mg twice a day. Patient recently called scheduling stating that he has been having dizziness and the itchiness all over on off since he started his medications in March, however there is no chest pain. Most recent labs obtained in July showed elevated creatinine of 1.5. LFTs normal. Cholesterol panel showed total cholesterol 152, triglycerides 50, HDL 40, LDL 82. He is pending carotid ultrasound on 9/20. Review of previous records shows medication changes in March include up titration of carvedilol to 6.25 mg twice a day, addition of Zetia and Plavix.  According to the patient, his main concern has been that dizziness which is chronic. He does not feel like the room is spinning, he says it is not vertigo. When asked him to define dizziness, he says it is more difficulty with concentration. Some days, he think it is severe enough that he may pass out. He wished to know if any recent cardiacmedical medication is responsible for such symptom. I have reviewed his medication list, I do not think carvedilol is responsible for his current symptom. We did obtain another EKG today, he is not severely bradycardic. In  fact the patient says sometimes when he is dizzy his heart rate is normal and in the upper 60s. I very much doubt Plavix can explain his symptoms either. The reason he was started on Zetia is for hypertriglyceridemia, however given his current symptom, I think it is worth for him to do a trial of coming off Zetia for one week to see if it will improve his symptom. I did advise him to restart on the  Zetia after one week if he does not notice any difference as I think long-term it will be beneficial for him. I do not think his current symptom is caused by medication. His blood pressure is high today, however according to the patient, his blood pressure at home has been in the 120s to 130s range. I will not adjust his blood pressure medication based on this single value. Ultimately, I am concerned about his symptom may be related to vertebral and right carotid stenosis. He has upcoming carotid Doppler and will follow-up with Dr. Scot King afterward.   Past Medical History:  Diagnosis Date  . Anginal pain ([name])   . Anxiety    " OCCASIONAL"  . Arthritis   . Asthma due to seasonal allergies    uses inhalers prn  . Carotid artery occlusion    Left  . Complication of anesthesia    " had tremors after prostate surgery  . Coronary artery disease   . Diverticulitis    recurrent  . Family history of anesthesia complication    MOTHER   . GERD (gastroesophageal reflux disease)   . H/O hiatal hernia   . Hearing aid worn   . Hyperlipemia   . Hypertension   . Kidney stone    lithotripsy [id number] w complications, req stents, Dr Rosana Hoes  . Prostate CA ([place])   . Shortness of breath   . TIA (transient ischemic attack)   . Tinnitus     Past Surgical History:  Procedure Laterality Date  . CARDIAC CATHETERIZATION  2008   minimal dz, Dr Cathie Olden  . CARDIAC CATHETERIZATION N/A 10/09/2015   Procedure: Left Heart Cath and Coronary Angiography;  Surgeon: Zachary M Martinique, MD;  Location: [facility];  Service: Cardiovascular;  Laterality: N/A;  . CARDIAC CATHETERIZATION N/A 10/09/2015   Procedure: Intravascular Pressure Wire/FFR Study;  Surgeon: Zachary M Martinique, MD;  Location: [facility];  Service: Cardiovascular;  Laterality: N/A;  . CARDIAC CATHETERIZATION N/A 10/09/2015   Procedure: Coronary Stent Intervention;  Surgeon: Zachary M Martinique, MD;  Location: [facility];  Service: Cardiovascular;   Laterality: N/A;  . CAROTID ENDARTERECTOMY  Left   1998  . CHOLECYSTECTOMY    . CORONARY STENT PLACEMENT  10/09/2015   DES to RCA  . LEFT HEART CATHETERIZATION WITH CORONARY ANGIOGRAM N/A 09/15/2013   Procedure: LEFT HEART CATHETERIZATION WITH CORONARY ANGIOGRAM;  Surgeon: Zachary M Martinique, MD;  Location: [facility];  Service: Cardiovascular;  Laterality: N/A;  . PROSTATECTOMY  1998   radical for prostate cancer    Current Medications: Outpatient Medications Prior to Visit  Medication Sig Dispense Refill  . albuterol (PROVENTIL HFA) 108 (90 BASE) MCG/ACT inhaler Inhale 2 puffs into the lungs every 4 (four) hours as needed for wheezing. 18 g 0  . ARMOUR THYROID 90 MG tablet Take 90 mg by mouth every morning.  6  . Artificial Tear Solution (BION TEARS OP) Place 1 drop into both eyes daily as needed (dry eyes).    Marland Kitchen aspirin  81 MG tablet Take 1 tablet (81 mg total) by mouth daily. 30 tablet 12  . carvedilol (COREG) 6.25 MG tablet Take 1 tablet (6.25 mg total) by mouth 2 (two) times daily with a meal. 180 tablet 3  . cetirizine (ZYRTEC) 10 MG tablet Take 10 mg by mouth See admin instructions. Take 1 tablet (10 mg) by mouth daily during allergy season    . clopidogrel (PLAVIX) 75 MG tablet Take 1 tablet (75 mg total) by mouth daily with breakfast. 90 tablet 3  . ezetimibe (ZETIA) 10 MG tablet Take 1 tablet (10 mg total) by mouth daily. 90 tablet 3  . HYDROcodone-acetaminophen (NORCO/VICODIN) 5-325 MG tablet Take 0.5-1 tablets by mouth 2 (two) times daily as needed (pain). 60 tablet 0  . nitroGLYCERIN (NITROLINGUAL) 0.4 MG/SPRAY spray Place 1 spray under the tongue every 5 (five) minutes x 3 doses as needed for chest pain. 12 g 12  . polyethylene glycol (MIRALAX / GLYCOLAX) packet Take 17 g by mouth daily. Mix in 8 oz liquid and drink    . triamcinolone cream (KENALOG) 0.1 % Apply 1 application topically daily as needed (rash).    . beclomethasone (QVAR) 40 MCG/ACT inhaler Inhale into the lungs.      Marland Kitchen losartan (COZAAR) 100 MG tablet Take 1 tablet (100 mg total) by mouth daily. 30 tablet 3   No facility-administered medications prior to visit.      Allergies:   Ciprofloxacin; Codeine; Flexeril [cyclobenzaprine]; Methocarbamol; Sulfa antibiotics; Sulfamethoxazole; Sulfonamide derivatives; and Flagyl [metronidazole]   Social History   Social History  . Marital status: Married    Spouse name: N/A  . Number of children: N/A  . Years of education: N/A   Occupational History  . retired     Actor [employer]   Social History Main Topics  . Smoking status: Former Smoker    Packs/day: 2.50    Years: 30.00    Types: Cigarettes    Quit date: 08/28/1985  . Smokeless tobacco: Never Used  . Alcohol use 1.2 oz/week    2 Standard drinks or equivalent per week     Comment: 1 glass wine/week (prior 1 glass martini with dinner)  . Drug use: No  . Sexual activity: Yes    Birth control/ protection: None   Other Topics Concern  . None   Social History Narrative   Lives with wife--recently diagnosed with breast ca; No smoking; Occassional wine; Retired; 2 daughters live in [place]--5 grandchildren(boys). On weight watchers. Lives in [place] with wife. Retired Solicitor. Tobacco history 2 ppd x 32 years. No smoking x 25 years. No drugs.     Family History:  The patient's family history includes CAD in his child; Dementia in his sister; Hashimoto's thyroiditis in his child; Heart attack in his sister; Heart disease in his father and mother; Kidney failure in his father; Sjogren's syndrome in his child.   ROS:   Please see the history of present illness.    ROS All other systems reviewed and are negative.   PHYSICAL EXAM:   VS:  BP (!) 160/82   Pulse (!) 54   Ht 5\' 6"  (1.676 m)   Wt 212 lb 9.6 oz (96.4 kg)   BMI 34.31 kg/m    GEN: Well nourished, well developed, in no acute distress  HEENT: normal  Neck: no JVD, carotid bruits, or masses Cardiac: RRR;  no murmurs, rubs, or gallops,no edema  Respiratory:  clear to auscultation bilaterally, normal work  of breathing GI: soft, nontender, nondistended, + BS MS: no deformity or atrophy  Skin: warm and dry, no rash Neuro:  Alert and Oriented x 3, Strength and sensation are intact Psych: euthymic mood, full affect  Wt Readings from Last 3 Encounters:  03/18/16 212 lb 9.6 oz (96.4 kg)  02/12/16 211 lb 9.6 oz (96 kg)  01/20/16 209 lb 9.6 oz (95.1 kg)      Studies/Labs Reviewed:   EKG:  EKG is ordered today.  The ekg ordered today demonstrates NSR with incomplete RBBB  Recent Labs: 09/25/2015: TSH 1.12 10/10/2015: Hemoglobin 12.7; Platelets 100 01/28/2016: ALT 26; BUN 19; Creatinine, Ser 1.50; Potassium 4.6; Sodium 143   Lipid Panel    Component Value Date/Time   CHOL 152 01/28/2016 0759   TRIG 150 (H) 01/28/2016 0759   HDL 40 01/28/2016 0759   CHOLHDL 3.8 01/28/2016 0759   CHOLHDL 3.3 03/24/2015 1159   VLDL 24 03/24/2015 1159   LDLCALC 82 01/28/2016 0759   LDLDIRECT 70 12/21/2012 1015    Additional studies/ records that were reviewed today include:   Carotid US 06/18/2015 - Technically difficult due to high bifurcations. - Right - 60% to 79 % ICA stenosis. Vertebral artery flow is &quot; To   Fro &quot; consistent with a more proximal stenosis. - Left - 1% to 39% ICA stenosis. Vertebral artery flow is   antegrade.    CTA neck 06/26/2015 IMPRESSION: Advanced atherosclerotic disease at the carotid bifurcation on the right. Extensive irregular plaque. At least 80% stenosis of the proximal ICA with wide patency beyond that.  Previous carotid endarterectomy on the left. Intimal flap at the bifurcation region. No recurrent luminal stenosis.  70% or greater stenosis of the right vertebral artery at its origin from the subclavian. 70% stenosis of the left vertebral artery at its origin from the arch.    Cardiac cath 10/09/2015 Conclusion    Mid RCA lesion, 40%  stenosed.  Prox LAD to Mid LAD lesion, 20% stenosed.  Ost 1st Mrg to 1st Mrg lesion, 40% stenosed.  The left ventricular systolic function is normal.  Prox RCA lesion, 70% stenosed. Post intervention, there is a 0% residual stenosis.   1. Single vessel obstructive CAD 2. Abnormal FFR of the proximal RCA lesion 3. Normal LV function 4. Successful stenting of the proximal RCA with a DES  Plan: DAPT for one year. Anticipate DC in am.       ASSESSMENT:    1. Dizziness   2. Coronary artery disease involving native coronary artery of native heart without angina pectoris   3. Essential hypertension   4. Hyperlipidemia   5. Right-sided carotid artery disease ([place])   6. Vertebral artery narrowing, bilateral   7. H/O prostate cancer      PLAN:  In order of problems listed above:  1. Chronic dizziness: This has been going on for at least 6 months, maybe longer. He has seen by multiple providers regarding this dizziness. He is concerned that his medication which was started earlier this year may be responsible. I have reviewed his new medications with him, I doubt carvedilol and Plavix can cause his current symptom, we will do a trial for he will come off Zetia for one week to see if there is any difference. If there is no difference, I have instructed him to restart on Zetia. Ultimately, I do not think it is his medication is causing his dizziness. I think it may be more likely that his vertebral and  carotid stenosis is causing the issue.  2. CAD s/p DES to RCA in 09/2015: He denies any obvious angina pain.    3. HTN: His blood pressure is high today 160/82, however his normal blood pressure is in the 120s to 130s range. I will not treat this solitary number. Overall I do not think his symptom is related to uncontrolled high blood pressure as his blood pressure is normally well controlled. Of note, it appears he is on alternating dose of losartan/HCTZ and losartan alone every other day,  he says this is due to HCTZ causing some degree of leg cramps at night. If his blood pressure continued to be high on follow-up, it may be responsible for this alternating dose of losartan/HCTZ and losartan  4. HLD: Recent lipid panel obtained on 01/28/2016 shows total cholesterol 152, triglycerides 150, HDL 40, LDL 82, he was started on Zetia. Given worsening dizziness, I'm okay with him off Zetia for one week as a trial to see if it will improve his symptom, however I have instructed him to restart on Zetia if his symptom has no improvement. Overall, I doubt Zetia as the culprit  5. h/o carotid artery disease s/p L CEA 1998  - Carotid ultrasound on 06/18/2015 showed right ICA 60-79%, left ICA is 1-39%.   - Carotid CT was obtained on 06/26/2015 which showed 70% or greater stenosis of the right vertebral artery at its origin from subclavian, 70% stenosis in left vertebral artery at its origin, at least R 80% stenosis of the proximal ICA.   - This is being followed closely by Dr. Scot King, I am concerned that his recent symptom to be related to bilateral vertebral stenosis and a right carotid stenosis.  Medication Adjustments/Labs and Tests Ordered: Current medicines are reviewed at length with the patient today.  Concerns regarding medicines are outlined above.  Medication changes, Labs and Tests ordered today are listed in the Patient Instructions below. Patient Instructions  H. Eulas Post, [place] has recommended that you HOLD zetia for ONE WEEK -- please call our office and let us know how you are feeling -- if your symptoms of dizziness have not changed, please resume zetia   Your physician recommends that you schedule a follow-up appointment in: THREE MONTHS with [name]     Signed, Almyra Deforest, [place]  03/18/2016 5:24 PM    [address], [place], [place]  [address] Phone: [phone]; Fax: [phone]

## 2016-03-18 NOTE — Patient Instructions (Signed)
Zachary Ridge, [place] has recommended that you HOLD zetia for ONE WEEK -- please call our office and let us know how you are feeling -- if your symptoms of dizziness have not changed, please resume zetia   Your physician recommends that you schedule a follow-up appointment in: [place] with Dr. Martinique

## 2016-03-26 ENCOUNTER — Encounter: Payer: Self-pay | Admitting: Vascular Surgery

## 2016-04-01 ENCOUNTER — Ambulatory Visit (HOSPITAL_COMMUNITY)
Admission: RE | Admit: 2016-04-01 | Discharge: 2016-04-01 | Disposition: A | Discharge status: Home / Self Care | Payer: Medicare Other | Source: Ambulatory Visit | Attending: Vascular Surgery | Admitting: Vascular Surgery

## 2016-04-01 ENCOUNTER — Encounter: Payer: Self-pay | Admitting: Vascular Surgery

## 2016-04-01 ENCOUNTER — Ambulatory Visit (INDEPENDENT_AMBULATORY_CARE_PROVIDER_SITE_OTHER): Payer: Medicare Other | Admitting: Vascular Surgery

## 2016-04-01 VITALS — BP 145/72 | HR 52 | Ht 66.0 in | Wt 212.5 lb

## 2016-04-01 DIAGNOSIS — I6523 Occlusion and stenosis of bilateral carotid arteries: Secondary | ICD-10-CM | POA: Diagnosis not present

## 2016-04-01 DIAGNOSIS — I6521 Occlusion and stenosis of right carotid artery: Secondary | ICD-10-CM | POA: Diagnosis present

## 2016-04-01 LAB — VAS US CAROTID
LCCADSYS: -73 cm/s
LCCAPDIAS: 20 cm/s
LCCAPSYS: 112 cm/s
LEFT ECA DIAS: -5 cm/s
LEFT VERTEBRAL DIAS: -18 cm/s
LICADSYS: -84 cm/s
Left CCA dist dias: -13 cm/s
Left ICA dist dias: -19 cm/s
Left ICA prox dias: -29 cm/s
Left ICA prox sys: -97 cm/s
RCCADSYS: -95 cm/s
RCCAPDIAS: 18 cm/s
RCCAPSYS: 126 cm/s
RIGHT CCA MID DIAS: 14 cm/s
RIGHT ECA DIAS: -11 cm/s
RIGHT VERTEBRAL DIAS: -6 cm/s

## 2016-04-01 NOTE — Progress Notes (Signed)
Patient name: [name] MRN: [id number] DOB: 1934/01/04 Sex: male  REASON FOR VISIT: Follow up of right carotid stenosis.  HPI: [name] is a 82 y.o. male who is status post left carotid endarterectomy in 1998. I saw that the patient most recently in February of this year with a 60-79% right carotid stenosis. As this was asymptomatic, I recommended a follow up carotid duplex can in 6 months. The patient was on aspirin and was also on a statin.  Since I saw him last, he denies any history of stroke, TIAs, expressive or receptive aphasia, or amaurosis fugax. He has undergone PTCA in March and has done well from that standpoint. He was started on Plavix at that time and is also on Zetia for his cholesterol. It sounds like he had an intolerance to statins in the past.  Past Medical History:  Diagnosis Date  . Anginal pain ([place])   . Anxiety    " OCCASIONAL"  . Arthritis   . Asthma due to seasonal allergies    uses inhalers prn  . Carotid artery occlusion    Left  . Complication of anesthesia    " had tremors after prostate surgery  . Coronary artery disease   . Diverticulitis    recurrent  . Family history of anesthesia complication    MOTHER   . GERD (gastroesophageal reflux disease)   . H/O hiatal hernia   . Hearing aid worn   . Hyperlipemia   . Hypertension   . Kidney stone    lithotripsy 8768 w complications, req stents, Dr Rosana Hoes  . Prostate CA ([place])   . Shortness of breath   . TIA (transient ischemic attack)   . Tinnitus     Family History  Problem Relation Age of Onset  . Heart disease Mother   . Heart disease Father   . Kidney failure Father   . Heart disease Sister   . Heart attack Sister   . Dementia Sister   . Sjogren's syndrome Child   . Hashimoto's thyroiditis Child   . CAD Child     SOCIAL HISTORY: Social History  Substance Use Topics  . Smoking status: Former Smoker    Packs/day: 2.50    Years: 30.00    Types: Cigarettes    Quit date:  08/28/1985  . Smokeless tobacco: Never Used  . Alcohol use 1.2 oz/week    2 Standard drinks or equivalent per week     Comment: 1 glass wine/week (prior 1 glass martini with dinner)    Allergies  Allergen Reactions  . Ciprofloxacin Other (See Comments)    Hallucinations or jittery Hallucinations or jittery  . Codeine Other (See Comments)    hallucinations hallucinations  . Flexeril [Cyclobenzaprine]   . Methocarbamol   . Sulfa Antibiotics Other (See Comments)    Chills and shaking "serum sickness" Chills and shaking "serum sickness"  . Sulfamethoxazole Other (See Comments)    UNKNOWN REACTION, NOT DOCUMENTED  . Sulfonamide Derivatives Other (See Comments)    Chills and shaking "serum sickness"  . Flagyl [Metronidazole] Rash    Current Outpatient Prescriptions  Medication Sig Dispense Refill  . albuterol (PROVENTIL HFA) 108 (90 BASE) MCG/ACT inhaler Inhale 2 puffs into the lungs every 4 (four) hours as needed for wheezing. 18 g 0  . Artificial Tear Solution (BION TEARS OP) Place 1 drop into both eyes daily as needed (dry eyes).    Marland Kitchen aspirin 81 MG tablet Take 1 tablet (81 mg  total) by mouth daily. 30 tablet 12  . carvedilol (COREG) 6.25 MG tablet Take 1 tablet (6.25 mg total) by mouth 2 (two) times daily with a meal. 180 tablet 3  . cetirizine (ZYRTEC) 10 MG tablet Take 10 mg by mouth See admin instructions. Take 1 tablet (10 mg) by mouth daily during allergy season    . clopidogrel (PLAVIX) 75 MG tablet Take 1 tablet (75 mg total) by mouth daily with breakfast. 90 tablet 3  . ezetimibe (ZETIA) 10 MG tablet Take 1 tablet (10 mg total) by mouth daily. 90 tablet 3  . HYDROcodone-acetaminophen (NORCO/VICODIN) 5-325 MG tablet Take 0.5-1 tablets by mouth 2 (two) times daily as needed (pain). 60 tablet 0  . levothyroxine (SYNTHROID, LEVOTHROID) 100 MCG tablet Take 1 tablet by mouth daily.  1  . liothyronine (CYTOMEL) 5 MCG tablet Take 5 mcg by mouth every morning.  6  .  losartan-hydrochlorothiazide (HYZAAR) 100-12.5 MG tablet Take 1 tablet by mouth every other day.   3  . nitroGLYCERIN (NITROLINGUAL) 0.4 MG/SPRAY spray Place 1 spray under the tongue every 5 (five) minutes x 3 doses as needed for chest pain. 12 g 12  . polyethylene glycol (MIRALAX / GLYCOLAX) packet Take 17 g by mouth daily. Mix in 8 oz liquid and drink    . triamcinolone cream (KENALOG) 0.1 % Apply 1 application topically daily as needed (rash).    Francia Greaves THYROID 90 MG tablet Take 90 mg by mouth every morning.  6  . losartan (COZAAR) 100 MG tablet Take 100 mg by mouth every other day.  3   No current facility-administered medications for this visit.     REVIEW OF SYSTEMS:  [X]  denotes positive finding, [ ]  denotes negative finding Cardiac  Comments:  Chest pain or chest pressure:    Shortness of breath upon exertion:    Short of breath when lying flat:    Irregular heart rhythm:        Vascular    Pain in calf, thigh, or hip brought on by ambulation:    Pain in feet at night that wakes you up from your sleep:     Blood clot in your veins:    Leg swelling:         Pulmonary    Oxygen at home:    Productive cough:     Wheezing:         Neurologic    Sudden weakness in arms or legs:     Sudden numbness in arms or legs:     Sudden onset of difficulty speaking or slurred speech:    Temporary loss of vision in one eye:     Problems with dizziness:         Gastrointestinal    Blood in stool:     Vomited blood:         Genitourinary    Burning when urinating:     Blood in urine:        Psychiatric    Major depression:         Hematologic    Bleeding problems:    Problems with blood clotting too easily:        Skin    Rashes or ulcers:        Constitutional    Fever or chills:      PHYSICAL EXAM: Vitals:   04/01/16 1020 04/01/16 1022  BP: (!) 148/75 (!) 145/72  Pulse: (!) 52   SpO2: 96%  Weight: 212 lb 8 oz (96.4 kg)   Height: 5\' 6"  (1.676 m)      GENERAL: The patient is a well-nourished male, in no acute distress. The vital signs are documented above. CARDIAC: There is a regular rate and rhythm.  VASCULAR: I do not detect carotid bruits. Both feet are warm and well-perfused. PULMONARY: There is good air exchange bilaterally without wheezing or rales. ABDOMEN: Soft and non-tender with normal pitched bowel sounds.  MUSCULOSKELETAL: There are no major deformities or cyanosis. NEUROLOGIC: No focal weakness or paresthesias are detected. SKIN: There are no ulcers or rashes noted. PSYCHIATRIC: The patient has a normal affect.  DATA:   CAROTID DUPLEX: I have independently interpreted his carotid duplex scan. He has a 60-79% right carotid stenosis and a less than 39% left carotid stenosis.  MEDICAL ISSUES:  ASYMPTOMATIC 60-79% RIGHT CAROTID STENOSIS: This patient has an asymptomatic 60-79% right carotid stenosis. He has undergone previous left carotid endarterectomy and has no evidence of recurrent stenosis there. He understands we would not consider right carotid endarterectomy was the stenosis progressed to greater than 80% or he develop new neurologic symptoms. I've ordered a follow duplex scan in 6 months and I'll see him back at that time. He knows to call sooner if he has problems. In the meantime he knows to remain on his aspirin and Plavix.  Deitra Mayo [facility] [phone]

## 2016-05-11 ENCOUNTER — Encounter: Payer: Self-pay | Admitting: Family Medicine

## 2016-05-11 ENCOUNTER — Ambulatory Visit (INDEPENDENT_AMBULATORY_CARE_PROVIDER_SITE_OTHER): Payer: Medicare Other | Admitting: Family Medicine

## 2016-05-11 VITALS — BP 146/66 | HR 58 | Temp 97.6°F | Wt 214.4 lb

## 2016-05-11 DIAGNOSIS — R7989 Other specified abnormal findings of blood chemistry: Secondary | ICD-10-CM | POA: Diagnosis not present

## 2016-05-11 DIAGNOSIS — J300 Vasomotor rhinitis: Secondary | ICD-10-CM | POA: Diagnosis not present

## 2016-05-11 DIAGNOSIS — E034 Atrophy of thyroid (acquired): Secondary | ICD-10-CM

## 2016-05-11 DIAGNOSIS — G8929 Other chronic pain: Secondary | ICD-10-CM

## 2016-05-11 DIAGNOSIS — M25562 Pain in left knee: Secondary | ICD-10-CM

## 2016-05-11 DIAGNOSIS — Z5181 Encounter for therapeutic drug level monitoring: Secondary | ICD-10-CM | POA: Diagnosis not present

## 2016-05-11 DIAGNOSIS — Z8546 Personal history of malignant neoplasm of prostate: Secondary | ICD-10-CM | POA: Diagnosis not present

## 2016-05-11 LAB — TSH: TSH: 2.13 u[IU]/mL (ref 0.35–4.50)

## 2016-05-11 LAB — VITAMIN B12

## 2016-05-11 LAB — BASIC METABOLIC PANEL
BUN: 25 mg/dL — AB (ref 6–23)
CO2: 31 mEq/L (ref 19–32)
Calcium: 9.6 mg/dL (ref 8.4–10.5)
Chloride: 102 mEq/L (ref 96–112)
Creatinine, Ser: 1.54 mg/dL — ABNORMAL HIGH (ref 0.40–1.50)
GFR: 46.34 mL/min — AB (ref >60.00)
Glucose, Bld: 100 mg/dL — ABNORMAL HIGH (ref 70–99)
POTASSIUM: 4.5 meq/L (ref 3.5–5.1)
SODIUM: 139 meq/L (ref 135–145)

## 2016-05-11 LAB — PSA: PSA: 0 ng/mL — ABNORMAL LOW (ref 0.10–4.00)

## 2016-05-11 MED ORDER — IPRATROPIUM BROMIDE 0.03 % NA SOLN: 2.0000 | Freq: Four times a day (QID) | NASAL | 6 refills | Status: DC

## 2016-05-11 MED ORDER — HYDROCODONE-ACETAMINOPHEN 5-325 MG PO TABS: 0.5000 | ORAL_TABLET | Freq: Two times a day (BID) | ORAL | 0 refills | Status: DC | PRN

## 2016-05-11 NOTE — Patient Instructions (Addendum)
It was good to see you today- we will check your labs today I refilled your vicodin to use as needed for knee (and other) pains!  Remember this medication can make you feel sleepy, do not use it when you need to drive. Try the atrovent nasal spray as needed-2 sprays up to 4x a day as needed for runny nose

## 2016-05-11 NOTE — Progress Notes (Signed)
Pre visit review using our clinic review tool, if applicable. No additional management support is needed unless otherwise documented below in the visit note. 

## 2016-05-11 NOTE — Progress Notes (Addendum)
[place] [facility] [address], [place], [place] [address] [phone]  Date:  05/11/2016   Name:  [name]   DOB:  1934-02-19   MRN:  [id number]  PCP:  Lamar Blinks, MD    Chief Complaint: Follow-up (Wants to know about Thyroid medication. )   History of Present Illness:  [name] is a 82 y.o. very pleasant male patient who presents with the following:  Here today with a few questions and concerns Last seen by myself in August with dizziness and weakness for over one year Lab Results  Component Value Date   TSH 1.12 09/25/2015   He declines a flu shot today. He feels like he got sick following this last year  He still feels like he has poor balance, he is doing some fitness/ rehab program at a gym in [facility] He notes "no energy" His sleep is fractured and he notes that he sleeps poorly at night, then will take 1-2 naps daily  He feels like he is "so tired."   He has OSA, has CPAP but his machine is broken. [facility] and does plan to get it fixed soon He notes that he often has a runny nose, "365 days a year, we go throguh 100 boxes of tissues."  This just started about a year ago  He needs his concealed carry permit renewed.  I have not gotten any paperwork from the PD about this as of yet.   He states that he has had his permit for 15 years  He had an extremely high B12 level about a year ago.  He is not taking B12 at all- we are not sure why this value is so high  Last lipids done in July.   He has a prostatectomy in 1998- he did have cancer, but his PSA has looked ok since. He does not have any issues with urinary retention  He rarely uses vicodin for pain in his right knee due to OA.  He is not interested in a knee replacement as he feels he is too old for this operation   Patient Active Problem List   Diagnosis Date Noted  . Angina pectoris ([place]) 10/09/2015   . Coronary artery disease   . Hypertension   . Hyperlipemia   . Carotid artery occlusion   . Cerumen impaction 08/30/2015  . Dizziness 08/09/2015  . Gait instability 05/30/2015  . Myalgia   . Weakness   . Bloating   . Chest pain at rest 03/24/2015  . Atypical chest pain 03/24/2015  . Fatigue 03/14/2015  . Hypothyroidism 10/26/2013  . OSA (obstructive sleep apnea) 10/18/2013  . Well adult health check 01/02/2013  . Other symptoms involving urinary system(788.99) 12/21/2012  . Benign localized hyperplasia of prostate with urinary obstruction and other lower urinary tract symptoms (LUTS)(600.21) 12/21/2012  . Carcinoma in situ of prostate 12/21/2012  . Left shoulder pain 08/08/2012  . Obesity 07/24/2011  . Generalized anxiety disorder 07/24/2011  . DIVERTICULOSIS OF COLON 12/25/2009  . DERMATITIS, SEBORRHEIC 12/25/2009  . 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 Memory loss 08/14/2009  . BACK PAIN, LUMBAR 07/19/2009  . PROSTATE CANCER 09/09/2006  . HYPERLIPIDEMIA 09/09/2006  . HYPERTENSION, BENIGN SYSTEMIC 09/09/2006  . Coronary atherosclerosis 09/09/2006  . VENOUS INSUFFICIENCY, CHRONIC 09/09/2006  . GASTROESOPHAGEAL REFLUX, NO ESOPHAGITIS 09/09/2006  . HERNIA, HIATAL, NONCONGENITAL 09/09/2006  . INSOMNIA NOS 09/09/2006  Past Medical History:  Diagnosis Date  . Anginal pain ([place])   . Anxiety    " OCCASIONAL"  . Arthritis   . Asthma due to seasonal allergies    uses inhalers prn  . Carotid artery occlusion    Left  . Complication of anesthesia    " had tremors after prostate surgery  . Coronary artery disease   . Diverticulitis    recurrent  . Family history of anesthesia complication    MOTHER   . GERD (gastroesophageal reflux disease)   . H/O hiatal hernia   . Hearing aid worn   . Hyperlipemia   . Hypertension   . Kidney stone    lithotripsy 9562 w complications, req stents, Dr Rosana Hoes  . Prostate CA ([place])   . Shortness of breath   . TIA (transient  ischemic attack)   . Tinnitus     Past Surgical History:  Procedure Laterality Date  . CARDIAC CATHETERIZATION  2008   minimal dz, Dr Cathie Olden  . CARDIAC CATHETERIZATION N/A 10/09/2015   Procedure: Left Heart Cath and Coronary Angiography;  Surgeon: Peter M Martinique, MD;  Location: [facility];  Service: Cardiovascular;  Laterality: N/A;  . CARDIAC CATHETERIZATION N/A 10/09/2015   Procedure: Intravascular Pressure Wire/FFR Study;  Surgeon: Peter M Martinique, MD;  Location: [place] [facility];  Service: Cardiovascular;  Laterality: N/A;  . CARDIAC CATHETERIZATION N/A 10/09/2015   Procedure: Coronary Stent Intervention;  Surgeon: Peter M Martinique, MD;  Location: [facility];  Service: Cardiovascular;  Laterality: N/A;  . CAROTID ENDARTERECTOMY  Left   1998  . CHOLECYSTECTOMY    . CORONARY STENT PLACEMENT  10/09/2015   DES to RCA  . LEFT HEART CATHETERIZATION WITH CORONARY ANGIOGRAM N/A 09/15/2013   Procedure: LEFT HEART CATHETERIZATION WITH CORONARY ANGIOGRAM;  Surgeon: Peter M Martinique, MD;  Location[facility] LAB;  Service: Cardiovascular;  Laterality: N/A;  . PROSTATECTOMY  1998   radical for prostate cancer    Social History  Substance Use Topics  . Smoking status: Former Smoker    Packs/day: 2.50    Years: 30.00    Types: Cigarettes    Quit date: 08/28/1985  . Smokeless tobacco: Never Used  . Alcohol use 1.2 oz/week    2 Standard drinks or equivalent per week     Comment: 1 glass wine/week (prior 1 glass martini with dinner)    Family History  Problem Relation Age of Onset  . Heart disease Mother   . Heart disease Father   . Kidney failure Father   . Heart disease Sister   . Heart attack Sister   . Dementia Sister   . Sjogren's syndrome Child   . Hashimoto's thyroiditis Child   . CAD Child     Allergies  Allergen Reactions  . Ciprofloxacin Other (See Comments)    Hallucinations or jittery Hallucinations or jittery  . Codeine Other (See Comments)     hallucinations hallucinations  . Flexeril [Cyclobenzaprine]   . Methocarbamol   . Sulfa Antibiotics Other (See Comments)    Chills and shaking "serum sickness" Chills and shaking "serum sickness"  . Sulfamethoxazole Other (See Comments)    UNKNOWN REACTION, NOT DOCUMENTED  . Sulfonamide Derivatives Other (See Comments)    Chills and shaking "serum sickness"  . Flagyl [Metronidazole] Rash    Medication list has been reviewed and updated.  Current Outpatient Prescriptions on File Prior to Visit  Medication Sig Dispense Refill  . albuterol (PROVENTIL HFA)  108 (90 BASE) MCG/ACT inhaler Inhale 2 puffs into the lungs every 4 (four) hours as needed for wheezing. 18 g 0  . Artificial Tear Solution (BION TEARS OP) Place 1 drop into both eyes daily as needed (dry eyes).    Marland Kitchen aspirin 81 MG tablet Take 1 tablet (81 mg total) by mouth daily. 30 tablet 12  . carvedilol (COREG) 6.25 MG tablet Take 1 tablet (6.25 mg total) by mouth 2 (two) times daily with a meal. 180 tablet 3  . cetirizine (ZYRTEC) 10 MG tablet Take 10 mg by mouth See admin instructions. Take 1 tablet (10 mg) by mouth daily during allergy season    . clopidogrel (PLAVIX) 75 MG tablet Take 1 tablet (75 mg total) by mouth daily with breakfast. 90 tablet 3  . ezetimibe (ZETIA) 10 MG tablet Take 1 tablet (10 mg total) by mouth daily. 90 tablet 3  . HYDROcodone-acetaminophen (NORCO/VICODIN) 5-325 MG tablet Take 0.5-1 tablets by mouth 2 (two) times daily as needed (pain). 60 tablet 0  . levothyroxine (SYNTHROID, LEVOTHROID) 100 MCG tablet Take 1 tablet by mouth daily.  1  . liothyronine (CYTOMEL) 5 MCG tablet Take 5 mcg by mouth every morning.  6  . losartan (COZAAR) 100 MG tablet Take 100 mg by mouth every other day.  3  . losartan-hydrochlorothiazide (HYZAAR) 100-12.5 MG tablet Take 1 tablet by mouth every other day.   3  . nitroGLYCERIN (NITROLINGUAL) 0.4 MG/SPRAY spray Place 1 spray under the tongue every 5 (five) minutes x 3 doses as  needed for chest pain. 12 g 12  . polyethylene glycol (MIRALAX / GLYCOLAX) packet Take 17 g by mouth daily. Mix in 8 oz liquid and drink    . triamcinolone cream (KENALOG) 0.1 % Apply 1 application topically daily as needed (rash).     No current facility-administered medications on file prior to visit.     Review of Systems:  As per HPI- otherwise negative.   Physical Examination: Vitals:   05/11/16 1100  BP: (!) 146/66  Pulse: (!) 58  Temp: 97.6 F (36.4 C)   Vitals:   05/11/16 1100  Weight: 214 lb 6.4 oz (97.3 kg)   Body mass index is 34.61 kg/m. Ideal Body Weight:    GEN: WDWN, NAD, Non-toxic, A & O x 3, overweight, looks well HEENT: Atraumatic, Normocephalic. Neck supple. No masses, No LAD.  Wears hearing aids.  Nasal cavity is WNL Ears and Nose: No external deformity. CV: RRR, No M/G/R. No JVD. No thrill. No extra heart sounds. PULM: CTA B, no wheezes, crackles, rhonchi. No retractions. No resp. distress. No accessory muscle use. ABD: S, NT, ND EXTR: No c/c/e NEURO slow, favors right knee PSYCH: Normally interactive. Conversant. Not depressed or anxious appearing.  Calm demeanor.    Assessment and Plan: Chronic vasomotor rhinitis - Plan: ipratropium (ATROVENT) 0.03 % nasal spray  Hypothyroidism due to acquired atrophy of thyroid - Plan: TSH  High serum vitamin B12 - Plan: B12  History of prostate cancer - Plan: PSA  Medication monitoring encounter - Plan: Basic metabolic panel  Chronic pain of left knee - Plan: HYDROcodone-acetaminophen (NORCO/VICODIN) 5-325 MG tablet  Here today for a recheck visit He is on an unconventional thyroid regimen, but this is likely not harmful and he prefers to continue this. Will continue to rx for him assuming his TSH is ok Trial of atrovent nasal for chronic rhinorrhea Other labs pending as above Will plan further follow- up pending labs.  Signed Lamar Blinks, MD  Received labs as below Results for orders placed  or performed in visit on 05/11/16  TSH  Result Value Ref Range   TSH 2.13 0.35 - 4.50 uIU/mL  B12  Result Value Ref Range   Vitamin B-12 >1500 (H) 211 - 911 pg/mL  PSA  Result Value Ref Range   PSA 0.00 Repeated and verified X2. (L) 0.10 - 4.00 ng/mL  Basic metabolic panel  Result Value Ref Range   Sodium 139 135 - 145 mEq/L   Potassium 4.5 3.5 - 5.1 mEq/L   Chloride 102 96 - 112 mEq/L   CO2 31 19 - 32 mEq/L   Glucose, Bld 100 (H) 70 - 99 mg/dL   BUN 25 (H) 6 - 23 mg/dL   Creatinine, Ser 1.54 (H) 0.40 - 1.50 mg/dL   Calcium 9.6 8.4 - 10.5 mg/dL   GFR 46.34 (L) >60.00 mL/min   Will refer to hematology regarding his B12 as this could possibly be significant.  Otherwise labs are ok, renal function is stable

## 2016-05-12 NOTE — Addendum Note (Signed)
Addended by: Lamar Blinks C on: 05/12/2016 04:41 PM   Modules accepted: Orders

## 2016-05-21 ENCOUNTER — Other Ambulatory Visit: Payer: Self-pay | Admitting: Family

## 2016-05-21 DIAGNOSIS — R748 Abnormal levels of other serum enzymes: Secondary | ICD-10-CM

## 2016-05-22 ENCOUNTER — Other Ambulatory Visit (HOSPITAL_BASED_OUTPATIENT_CLINIC_OR_DEPARTMENT_OTHER): Payer: Medicare Other

## 2016-05-22 ENCOUNTER — Ambulatory Visit: Payer: Medicare Other

## 2016-05-22 ENCOUNTER — Encounter: Payer: Self-pay | Admitting: Family

## 2016-05-22 ENCOUNTER — Ambulatory Visit (HOSPITAL_BASED_OUTPATIENT_CLINIC_OR_DEPARTMENT_OTHER): Payer: Medicare Other | Admitting: Family

## 2016-05-22 VITALS — BP 147/67 | HR 52 | Temp 98.0°F | Resp 16 | Ht 66.0 in | Wt 215.8 lb

## 2016-05-22 DIAGNOSIS — R0602 Shortness of breath: Secondary | ICD-10-CM | POA: Diagnosis not present

## 2016-05-22 DIAGNOSIS — R531 Weakness: Secondary | ICD-10-CM

## 2016-05-22 DIAGNOSIS — D509 Iron deficiency anemia, unspecified: Secondary | ICD-10-CM

## 2016-05-22 DIAGNOSIS — Z8546 Personal history of malignant neoplasm of prostate: Secondary | ICD-10-CM | POA: Diagnosis not present

## 2016-05-22 DIAGNOSIS — R748 Abnormal levels of other serum enzymes: Secondary | ICD-10-CM

## 2016-05-22 DIAGNOSIS — E538 Deficiency of other specified B group vitamins: Secondary | ICD-10-CM

## 2016-05-22 DIAGNOSIS — R5383 Other fatigue: Secondary | ICD-10-CM

## 2016-05-22 DIAGNOSIS — R7989 Other specified abnormal findings of blood chemistry: Secondary | ICD-10-CM

## 2016-05-22 LAB — CBC WITH DIFFERENTIAL (CANCER CENTER ONLY)
BASO#: 0.2 10*3/uL (ref 0.0–0.2)
BASO%: 3.5 % — ABNORMAL HIGH (ref 0.0–2.0)
EOS ABS: 0.7 10*3/uL — AB (ref 0.0–0.5)
EOS%: 12.9 % — ABNORMAL HIGH (ref 0.0–7.0)
HCT: 40.1 % (ref 38.7–49.9)
HGB: 14.5 g/dL (ref 13.0–17.1)
LYMPH#: 1.3 10*3/uL (ref 0.9–3.3)
LYMPH%: 24.7 % (ref 14.0–48.0)
MCH: 35.3 pg — AB (ref 28.0–33.4)
MCHC: 36.2 g/dL — AB (ref 32.0–35.9)
MCV: 98 fL (ref 82–98)
MONO#: 0.8 10*3/uL (ref 0.1–0.9)
MONO%: 14.7 % — AB (ref 0.0–13.0)
NEUT#: 2.3 10*3/uL (ref 1.5–6.5)
NEUT%: 44.2 % (ref 40.0–80.0)
PLATELETS: 111 10*3/uL — AB (ref 145–400)
RBC: 4.11 10*6/uL — AB (ref 4.20–5.70)
RDW: 13.6 % (ref 11.1–15.7)
WBC: 5.2 10*3/uL (ref 4.0–10.0)

## 2016-05-22 LAB — LACTATE DEHYDROGENASE: LDH: 212 U/L (ref 125–245)

## 2016-05-22 LAB — TECHNOLOGIST REVIEW CHCC SATELLITE

## 2016-05-22 LAB — CHCC SATELLITE - SMEAR

## 2016-05-22 MED ORDER — FOLIC ACID 1 MG PO TABS: 1.0000 mg | ORAL_TABLET | Freq: Every day | ORAL | 4 refills | Status: DC

## 2016-05-22 NOTE — Progress Notes (Signed)
Hematology/Oncology Consultation   Name: ATUL DELUCIA      MRN: [id number]    Location: Room/bed info not found  Date: 05/22/2016 Time:12:31 PM   REFERRING PHYSICIAN: Silvestre Mesi, MD  REASON FOR CONSULT: Elevated serum B 12 level   DIAGNOSIS: Elevated serum B 12 level   HISTORY OF PRESENT ILLNESS: Mr. Belshe is a very pleasant 82 yo white male with history of an elevated serum B 12 level. His level today is > 2000.  He is overall symptomatic with fatigue, weakness, sleeping 12 hours a day. He also complains of "brain fog." He has noticed SOB with exertion when he exercises. He was a smoker for 40 years and quit in 1987. He has chronic bronchitis.  He has a history of prostate cancer in 1998. He had his prostate removed in 1998 at [facility] and has had no other issues.  No other personal or familial history of cancer.   He had the 23 and me genetic testing done but does not have the results with him. He plans to bring in the paperwork at his next appointment.  He has history of CAD and stroke. He had a heart cath with stent placement earlier this year and is on Plavix. No episodes of bleeding, bruising or petechiae.  He has had no issue with infections. No fever, chills, n/v, cough, rash, dizziness, palpitations, chest pain, abdominal pain or changes in bowel or bladder habits. No lymphadenopathy found on exam.  No swelling, tenderness or tingling in his extremities. He does have numbness in his toes that comes and goes.  He has maintained a good appetite and is staying well hydrated. Weight is stable.   ROS: All other 10 point review of systems is negative.   PAST MEDICAL HISTORY:   Past Medical History:  Diagnosis Date  . Anginal pain ([place])   . Anxiety    " OCCASIONAL"  . Arthritis   . Asthma due to seasonal allergies    uses inhalers prn  . Carotid artery occlusion    Left  . Complication of anesthesia    " had tremors after prostate surgery  . Coronary artery disease   .  Diverticulitis    recurrent  . Family history of anesthesia complication    MOTHER   . GERD (gastroesophageal reflux disease)   . H/O hiatal hernia   . Hearing aid worn   . Hyperlipemia   . Hypertension   . Kidney stone    lithotripsy 6712 w complications, req stents, Dr Rosana Hoes  . Prostate CA ([place])   . Shortness of breath   . TIA (transient ischemic attack)   . Tinnitus     ALLERGIES: Allergies  Allergen Reactions  . Ciprofloxacin Other (See Comments)    Hallucinations or jittery Hallucinations or jittery  . Codeine Other (See Comments)    hallucinations hallucinations  . Flexeril [Cyclobenzaprine]   . Methocarbamol   . Sulfa Antibiotics Other (See Comments)    Chills and shaking "serum sickness" Chills and shaking "serum sickness"  . Sulfamethoxazole Other (See Comments)    UNKNOWN REACTION, NOT DOCUMENTED  . Sulfonamide Derivatives Other (See Comments)    Chills and shaking "serum sickness"  . Flagyl [Metronidazole] Rash      MEDICATIONS:  Current Outpatient Prescriptions on File Prior to Visit  Medication Sig Dispense Refill  . albuterol (PROVENTIL HFA) 108 (90 BASE) MCG/ACT inhaler Inhale 2 puffs into the lungs every 4 (four) hours as needed for wheezing. 18 g 0  .  Artificial Tear Solution (BION TEARS OP) Place 1 drop into both eyes daily as needed (dry eyes).    Marland Kitchen aspirin 81 MG tablet Take 1 tablet (81 mg total) by mouth daily. 30 tablet 12  . carvedilol (COREG) 6.25 MG tablet Take 1 tablet (6.25 mg total) by mouth 2 (two) times daily with a meal. 180 tablet 3  . clopidogrel (PLAVIX) 75 MG tablet Take 1 tablet (75 mg total) by mouth daily with breakfast. 90 tablet 3  . ezetimibe (ZETIA) 10 MG tablet Take 1 tablet (10 mg total) by mouth daily. 90 tablet 3  . HYDROcodone-acetaminophen (NORCO/VICODIN) 5-325 MG tablet Take 0.5-1 tablets by mouth 2 (two) times daily as needed (pain). 60 tablet 0  . ipratropium (ATROVENT) 0.03 % nasal spray Place 2 sprays into the nose 4  (four) times daily. 30 mL 6  . levothyroxine (SYNTHROID, LEVOTHROID) 100 MCG tablet Take 1 tablet by mouth daily.  1  . liothyronine (CYTOMEL) 5 MCG tablet Take 5 mcg by mouth every morning.  6  . losartan (COZAAR) 100 MG tablet Take 100 mg by mouth every other day.  3  . losartan-hydrochlorothiazide (HYZAAR) 100-12.5 MG tablet Take 1 tablet by mouth every other day.   3  . nitroGLYCERIN (NITROLINGUAL) 0.4 MG/SPRAY spray Place 1 spray under the tongue every 5 (five) minutes x 3 doses as needed for chest pain. 12 g 12  . polyethylene glycol (MIRALAX / GLYCOLAX) packet Take 17 g by mouth daily. Mix in 8 oz liquid and drink    . triamcinolone cream (KENALOG) 0.1 % Apply 1 application topically daily as needed (rash).     No current facility-administered medications on file prior to visit.      PAST SURGICAL HISTORY Past Surgical History:  Procedure Laterality Date  . CARDIAC CATHETERIZATION  2008   minimal dz, Dr Cathie Olden  . CARDIAC CATHETERIZATION N/A 10/09/2015   Procedure: Left Heart Cath and Coronary Angiography;  Surgeon: Peter M Martinique, MD;  Location: [place] [facility];  Service: Cardiovascular;  Laterality: N/A;  . CARDIAC CATHETERIZATION N/A 10/09/2015   Procedure: Intravascular Pressure Wire/FFR Study;  Surgeon: Peter M Martinique, MD;  Location: [place] [facility];  Service: Cardiovascular;  Laterality: N/A;  . CARDIAC CATHETERIZATION N/A 10/09/2015   Procedure: Coronary Stent Intervention;  Surgeon: Peter M Martinique, MD;  Location: [place] [facility];  Service: Cardiovascular;  Laterality: N/A;  . CAROTID ENDARTERECTOMY  Left   1998  . CHOLECYSTECTOMY    . CORONARY STENT PLACEMENT  10/09/2015   DES to RCA  . LEFT HEART CATHETERIZATION WITH CORONARY ANGIOGRAM N/A 09/15/2013   Procedure: LEFT HEART CATHETERIZATION WITH CORONARY ANGIOGRAM;  Surgeon: Peter M Martinique, MD;  Location: [facility] C[facility] LAB;  Service: Cardiovascular;  Laterality: N/A;  . PROSTATECTOMY  1998   radical for prostate cancer     FAMILY HISTORY: Family History  Problem Relation Age of Onset  . Heart disease Mother   . Heart disease Father   . Kidney failure Father   . Heart disease Sister   . Heart attack Sister   . Dementia Sister   . Sjogren's syndrome Child   . Hashimoto's thyroiditis Child   . CAD Child     SOCIAL HISTORY:  reports that he quit smoking about 30 years ago. His smoking use included Cigarettes. He has a 75.00 pack-year smoking history. He has never used smokeless tobacco. He reports that he drinks about 1.2 oz of alcohol per week . He reports  that he does not use drugs.  PERFORMANCE STATUS: The patient's performance status is 1 - Symptomatic but completely ambulatory  PHYSICAL EXAM: Most Recent Vital Signs: Blood pressure (!) 147/67, pulse (!) 52, temperature 98 F (36.7 C), temperature source Oral, resp. rate 16, height 5' 6"  (1.676 m), weight 215 lb 12.8 oz (97.9 kg). BP (!) 147/67 (BP Location: Right Arm, Patient Position: Sitting)   Pulse (!) 52   Temp 98 F (36.7 C) (Oral)   Resp 16   Ht 5' 6"  (1.676 m)   Wt 215 lb 12.8 oz (97.9 kg)   BMI 34.83 kg/m   General Appearance:    Alert, cooperative, no distress, appears stated age  Head:    Normocephalic, without obvious abnormality, atraumatic  Eyes:    PERRL, conjunctiva/corneas clear, EOM's intact, fundi    benign, both eyes             Throat:   Lips, mucosa, and tongue normal; teeth and gums normal  Neck:   Supple, symmetrical, trachea midline, no adenopathy;       thyroid:  No enlargement/tenderness/nodules; no carotid   bruit or JVD  Back:     Symmetric, no curvature, ROM normal, no CVA tenderness  Lungs:     Clear to auscultation bilaterally, respirations unlabored  Chest wall:    No tenderness or deformity  Heart:    Regular rate and rhythm, S1 and S2 normal, no murmur, rub   or gallop  Abdomen:     Soft, non-tender, bowel sounds active all four quadrants,    no masses, no organomegaly        Extremities:    Extremities normal, atraumatic, no cyanosis or edema  Pulses:   2+ and symmetric all extremities  Skin:   Skin color, texture, turgor normal, no rashes or lesions  Lymph nodes:   Cervical, supraclavicular, and axillary nodes normal  Neurologic:   CNII-XII intact. Normal strength, sensation and reflexes      throughout    LABORATORY DATA: Results for orders placed or performed in visit on 05/22/16 (from the past 48 hour(s))  CBC w/Diff     Status: Abnormal   Collection Time: 05/22/16  9:12 AM  Result Value Ref Range   WBC 5.2 4.0 - 10.0 10e3/uL   RBC 4.11 (L) 4.20 - 5.70 10e6/uL   HGB 14.5 13.0 - 17.1 g/dL   HCT 40.1 38.7 - 49.9 %   MCV 98 82 - 98 fL   MCH 35.3 (H) 28.0 - 33.4 pg   MCHC 36.2 (H) 32.0 - 35.9 g/dL   RDW 13.6 11.1 - 15.7 %   Platelets 111 (L) 145 - 400 10e3/uL   NEUT# 2.3 1.5 - 6.5 10e3/uL   LYMPH# 1.3 0.9 - 3.3 10e3/uL   MONO# 0.8 0.1 - 0.9 10e3/uL   Eosinophils Absolute 0.7 (H) 0.0 - 0.5 10e3/uL   BASO# 0.2 0.0 - 0.2 10e3/uL   NEUT% 44.2 40.0 - 80.0 %   LYMPH% 24.7 14.0 - 48.0 %   MONO% 14.7 (H) 0.0 - 13.0 %   EOS% 12.9 (H) 0.0 - 7.0 %   BASO% 3.5 (H) 0.0 - 2.0 %  Smear     Status: None   Collection Time: 05/22/16  9:12 AM  Result Value Ref Range   Smear Result Smear Available   LDH     Status: None   Collection Time: 05/22/16  9:12 AM  Result Value Ref Range   LDH 212 125 -  245 U/L  TECHNOLOGIST REVIEW [facility]     Status: None   Collection Time: 05/22/16  9:12 AM  Result Value Ref Range   Tech Review Metas and Myelocytes present; sl polychromasia       RADIOGRAPHY: No results found.     PATHOLOGY: None   ASSESSMENT/PLAN: Mr. Stangelo is a very pleasant 82 yo white male with history of an elevated serum B 12 level. His level today is > 2000.  He is symptomatic with fatigue, weakness, SOB with exertion, and "brain fog."  He has a remote history of prostate cancer with prostatectomy in 1998 and possibly a low testosterone level. We will check a  testosterone level at his next visit.  No anemia at this time, platelet count is 111. Eosinophils are mildly elevated at 0.7.  We will start him on daily folic acid and see if this helps lower his B 12 level.  We will plan to see him back in 2 months for repeat lab work and follow-up. At that time we will re-evaluate the need for a bone marrow biopsy.  All questions were answered. He knows to contact our office with any problems, questions or concerns. We can certainly see him much sooner if necessary.  The patient was discussed with and also seen by Dr. Marin Olp and he is in agreement with the aforementioned.   [facility]    Addendum:  I saw and examined the patient with Sarah. His way hard to know if there is any significance to the elevated B-12 level. He has the problems with the fatigue and weakness. I'll note this might be from possibility of low testosterone.  He definitely is not anemic.  I looked at his blood and the microscope. I really do not see it in there that looked suspicious. There were some minimal myelocytes and metamyelocytes. His eosinophil count is up slightly. Again, it is hard to know what this really means.  I suppose he may have a myeloproliferative disorder. This is one thing I could think of that because elevated B-12 level. His LDH is okay. He does not have leukocytosis or thrombocytosis. In fact, there is a little bit of thrombocytopenia.  He is on quite a few medications. He is on thyroid medications. I will know if this might be causing him to have fatigue.  We'll prolong will folic acid. We'll see if this helps a little bit.  We will plan to get him back in about 6-8 weeks. We'll see how his labs look. We will see how he feels.  It was still see the elevated eosinophils, then we might want to consider sending off a JAK2 assay.  We spent about an hour with he and his wife. He served our country. I thanked him for serving or country.  Lattie Haw,  MD

## 2016-05-23 LAB — RETICULOCYTES: RETICULOCYTE COUNT: 3 % — AB (ref 0.6–2.6)

## 2016-05-23 LAB — VITAMIN B12

## 2016-05-27 LAB — METHYLMALONIC ACID, SERUM: METHYLMALONIC ACID: 313 nmol/L (ref 0–378)

## 2016-05-28 ENCOUNTER — Encounter (HOSPITAL_BASED_OUTPATIENT_CLINIC_OR_DEPARTMENT_OTHER): Payer: Self-pay | Admitting: *Deleted

## 2016-05-28 ENCOUNTER — Emergency Department (HOSPITAL_BASED_OUTPATIENT_CLINIC_OR_DEPARTMENT_OTHER)
Admission: EM | Admit: 2016-05-28 | Discharge: 2016-05-28 | Disposition: A | Discharge status: Home / Self Care | Payer: Medicare Other | Attending: Emergency Medicine | Admitting: Emergency Medicine

## 2016-05-28 ENCOUNTER — Emergency Department (HOSPITAL_BASED_OUTPATIENT_CLINIC_OR_DEPARTMENT_OTHER): Payer: Medicare Other

## 2016-05-28 DIAGNOSIS — I2510 Atherosclerotic heart disease of native coronary artery without angina pectoris: Secondary | ICD-10-CM | POA: Diagnosis not present

## 2016-05-28 DIAGNOSIS — Z7982 Long term (current) use of aspirin: Secondary | ICD-10-CM | POA: Diagnosis not present

## 2016-05-28 DIAGNOSIS — I10 Essential (primary) hypertension: Secondary | ICD-10-CM | POA: Diagnosis not present

## 2016-05-28 DIAGNOSIS — M79661 Pain in right lower leg: Secondary | ICD-10-CM | POA: Insufficient documentation

## 2016-05-28 DIAGNOSIS — J45909 Unspecified asthma, uncomplicated: Secondary | ICD-10-CM | POA: Insufficient documentation

## 2016-05-28 DIAGNOSIS — E039 Hypothyroidism, unspecified: Secondary | ICD-10-CM | POA: Insufficient documentation

## 2016-05-28 DIAGNOSIS — Z8546 Personal history of malignant neoplasm of prostate: Secondary | ICD-10-CM | POA: Diagnosis not present

## 2016-05-28 DIAGNOSIS — Z87891 Personal history of nicotine dependence: Secondary | ICD-10-CM | POA: Diagnosis not present

## 2016-05-28 DIAGNOSIS — M79604 Pain in right leg: Secondary | ICD-10-CM

## 2016-05-28 DIAGNOSIS — Z79899 Other long term (current) drug therapy: Secondary | ICD-10-CM | POA: Insufficient documentation

## 2016-05-28 MED ORDER — CEPHALEXIN 500 MG PO CAPS: 500.0000 mg | ORAL_CAPSULE | Freq: Four times a day (QID) | ORAL | 0 refills | Status: DC

## 2016-05-28 MED FILL — CEPHALEXIN 500 MG CAPSULE: 500 | 5 days supply | Qty: 20 | Fill #0

## 2016-05-28 NOTE — ED Provider Notes (Signed)
[facility] DEPT [facility] Provider Note   CSN: [id number] Arrival date & time: 05/28/16  0844     History   Chief Complaint Chief Complaint  Patient presents with  . Leg Pain    HPI [name] is a 82 y.o. male with history of chronic venous insufficiency who presents with a 2 day history of right calf pain. Patient reports that 2 days ago he felt a needlelike pain in his right calf. He reports that the area became red and had a 6-8 inch line of redness and pain vertically on his calf. Patient reports that yesterday today he felt like he was feeling better, however this morning he woke up and his entire was hurting. Patient thinks he may have been bitten by an insect. Patient used baking soda and water to clean the area. Patient has no history of blood clots and is active. Patient denies any fevers, chest pain, shortness of breath, abdominal pain, nausea, vomiting, urinary symptoms.Patient denies any fall or injury.  HPI  Past Medical History:  Diagnosis Date  . Anginal pain ([place])   . Anxiety    " OCCASIONAL"  . Arthritis   . Asthma due to seasonal allergies    uses inhalers prn  . Carotid artery occlusion    Left  . Complication of anesthesia    " had tremors after prostate surgery  . Coronary artery disease   . Diverticulitis    recurrent  . Family history of anesthesia complication    MOTHER   . GERD (gastroesophageal reflux disease)   . H/O hiatal hernia   . Hearing aid worn   . Hyperlipemia   . Hypertension   . Kidney stone    lithotripsy 2703 w complications, req stents, Dr Rosana Hoes  . Prostate CA ([place])   . Shortness of breath   . TIA (transient ischemic attack)   . Tinnitus     Patient Active Problem List   Diagnosis Date Noted  . Angina pectoris ([place]) 10/09/2015  . Coronary artery disease   . Hypertension   . Hyperlipemia   . Carotid artery occlusion   . Cerumen impaction 08/30/2015  . Dizziness 08/09/2015  . Gait instability 05/30/2015  . Myalgia     . Weakness   . Bloating   . Chest pain at rest 03/24/2015  . Atypical chest pain 03/24/2015  . Fatigue 03/14/2015  . Hypothyroidism 10/26/2013  . OSA (obstructive sleep apnea) 10/18/2013  . Well adult health check 01/02/2013  . Other symptoms involving urinary system(788.99) 12/21/2012  . Benign localized hyperplasia of prostate with urinary obstruction and other lower urinary tract symptoms (LUTS)(600.21) 12/21/2012  . Carcinoma in situ of prostate 12/21/2012  . Left shoulder pain 08/08/2012  . Obesity 07/24/2011  . Generalized anxiety disorder 07/24/2011  . DIVERTICULOSIS OF COLON 12/25/2009  . DERMATITIS, SEBORRHEIC 12/25/2009  . 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 Memory loss 08/14/2009  . BACK PAIN, LUMBAR 07/19/2009  . PROSTATE CANCER 09/09/2006  . HYPERLIPIDEMIA 09/09/2006  . HYPERTENSION, BENIGN SYSTEMIC 09/09/2006  . Coronary atherosclerosis 09/09/2006  . VENOUS INSUFFICIENCY, CHRONIC 09/09/2006  . GASTROESOPHAGEAL REFLUX, NO ESOPHAGITIS 09/09/2006  . HERNIA, HIATAL, NONCONGENITAL 09/09/2006  . INSOMNIA NOS 09/09/2006    Past Surgical History:  Procedure Laterality Date  . CARDIAC CATHETERIZATION  2008   minimal dz, Dr Cathie Olden  . CARDIAC CATHETERIZATION N/A 10/09/2015   Procedure: Left Heart Cath and Coronary Angiography;  Surgeon: Peter M Martinique, MD;  Location: [place] [facility];  Service:  Cardiovascular;  Laterality: N/A;  . CARDIAC CATHETERIZATION N/A 10/09/2015   Procedure: Intravascular Pressure Wire/FFR Study;  Surgeon: Peter M Martinique, MD;  Location: [place] [facility];  Service: Cardiovascular;  Laterality: N/A;  . CARDIAC CATHETERIZATION N/A 10/09/2015   Procedure: Coronary Stent Intervention;  Surgeon: Peter M Martinique, MD;  Location: [place] [facility];  Service: Cardiovascular;  Laterality: N/A;  . CAROTID ENDARTERECTOMY  Left   1998  . CHOLECYSTECTOMY    . CORONARY STENT PLACEMENT  10/09/2015   DES to RCA  . LEFT HEART CATHETERIZATION WITH CORONARY  ANGIOGRAM N/A 09/15/2013   Procedure: LEFT HEART CATHETERIZATION WITH CORONARY ANGIOGRAM;  Surgeon: Peter M Martinique, MD;  Location: [facility];  Service: Cardiovascular;  Laterality: N/A;  . PROSTATECTOMY  1998   radical for prostate cancer       Home Medications    Prior to Admission medications   Medication Sig Start Date End Date Taking? Authorizing Provider  albuterol (PROVENTIL HFA) 108 (90 BASE) MCG/ACT inhaler Inhale 2 puffs into the lungs every 4 (four) hours as needed for wheezing. 11/13/13   Leeanne Rio, MD  Artificial Tear Solution (BION TEARS OP) Place 1 drop into both eyes daily as needed (dry eyes).    Historical Provider, MD  aspirin 81 MG tablet Take 1 tablet (81 mg total) by mouth daily. 10/10/15   Arbutus Leas, NP  carvedilol (COREG) 6.25 MG tablet Take 1 tablet (6.25 mg total) by mouth 2 (two) times daily with a meal. 10/10/15   Arbutus Leas, NP  cephALEXin (KEFLEX) 500 MG capsule Take 1 capsule (500 mg total) by mouth 4 (four) times daily. 05/28/16   Frederica Kuster, PA-C  clopidogrel (PLAVIX) 75 MG tablet Take 1 tablet (75 mg total) by mouth daily with breakfast. 10/10/15   Arbutus Leas, NP  ezetimibe (ZETIA) 10 MG tablet Take 1 tablet (10 mg total) by mouth daily. 10/10/15   Arbutus Leas, NP  folic acid (FOLVITE) 1 MG tablet Take 1 tablet (1 mg total) by mouth daily. 05/22/16   Eliezer Bottom, NP  HYDROcodone-acetaminophen (NORCO/VICODIN) 5-325 MG tablet Take 0.5-1 tablets by mouth 2 (two) times daily as needed (pain). 05/11/16   Gay Filler Copland, MD  ipratropium (ATROVENT) 0.03 % nasal spray Place 2 sprays into the nose 4 (four) times daily. 05/11/16   Gay Filler Copland, MD  levothyroxine (SYNTHROID, LEVOTHROID) 100 MCG tablet Take 1 tablet by mouth daily. 01/20/16   Historical Provider, MD  liothyronine (CYTOMEL) 5 MCG tablet Take 5 mcg by mouth every morning. 02/17/16   Historical Provider, MD  losartan (COZAAR) 100 MG tablet Take 100 mg by mouth every other day.  03/10/16   Historical Provider, MD  losartan-hydrochlorothiazide (HYZAAR) 100-12.5 MG tablet Take 1 tablet by mouth every other day.  01/24/16   Historical Provider, MD  nitroGLYCERIN (NITROLINGUAL) 0.4 MG/SPRAY spray Place 1 spray under the tongue every 5 (five) minutes x 3 doses as needed for chest pain. 03/18/16   Almyra Deforest, PA  polyethylene glycol (MIRALAX / GLYCOLAX) packet Take 17 g by mouth daily. Mix in 8 oz liquid and drink    Historical Provider, MD  triamcinolone cream (KENALOG) 0.1 % Apply 1 application topically daily as needed (rash).    Historical Provider, MD    Family History Family History  Problem Relation Age of Onset  . Heart disease Mother   . Heart disease Father   . Kidney failure Father   .  Heart disease Sister   . Heart attack Sister   . Dementia Sister   . Sjogren's syndrome Child   . Hashimoto's thyroiditis Child   . CAD Child     Social History Social History  Substance Use Topics  . Smoking status: Former Smoker    Packs/day: 2.50    Years: 30.00    Types: Cigarettes    Quit date: 08/28/1985  . Smokeless tobacco: Never Used  . Alcohol use 1.2 oz/week    2 Standard drinks or equivalent per week     Comment: 1 glass wine/week (prior 1 glass martini with dinner)     Allergies   Ciprofloxacin; Codeine; Flexeril [cyclobenzaprine]; Methocarbamol; Sulfa antibiotics; Sulfamethoxazole; Sulfonamide derivatives; and Flagyl [metronidazole]   Review of Systems Review of Systems  Constitutional: Negative for chills and fever.  HENT: Negative for facial swelling and sore throat.   Respiratory: Negative for shortness of breath.   Cardiovascular: Negative for chest pain.  Gastrointestinal: Negative for abdominal pain, nausea and vomiting.  Genitourinary: Negative for dysuria.  Musculoskeletal: Positive for myalgias. Negative for back pain.  Skin: Positive for color change and wound (area of erythema). Negative for rash.  Neurological: Negative for headaches.    Psychiatric/Behavioral: The patient is not nervous/anxious.      Physical Exam Updated Vital Signs BP 156/77 (BP Location: Left Arm)   Pulse 64   Temp 97.7 F (36.5 C) (Oral)   Resp 18   Ht 5\' 6"  (1.676 m)   Wt 97.5 kg   SpO2 96%   BMI 34.70 kg/m   Physical Exam  Constitutional: He appears well-developed and well-nourished. No distress.  HENT:  Head: Normocephalic and atraumatic.  Mouth/Throat: Oropharynx is clear and moist. No oropharyngeal exudate.  Eyes: Conjunctivae are normal. Pupils are equal, round, and reactive to light. Right eye exhibits no discharge. Left eye exhibits no discharge. No scleral icterus.  Neck: Normal range of motion. Neck supple. No thyromegaly present.  Cardiovascular: Normal rate, regular rhythm, normal heart sounds and intact distal pulses.  Exam reveals no gallop and no friction rub.   No murmur heard. Pulmonary/Chest: Effort normal and breath sounds normal. No stridor. No respiratory distress. He has no wheezes. He has no rales.  Abdominal: Soft. Bowel sounds are normal. He exhibits no distension. There is no tenderness. There is no rebound and no guarding.  Musculoskeletal: He exhibits no edema.  Right calf tenderness to palpation around area of erythema with mild warmth around 2 cm diameter; tenderness tracks up the medial calf; normal sensation; DP pulse intact  Lymphadenopathy:    He has no cervical adenopathy.  Neurological: He is alert. Coordination normal.  Skin: Skin is warm and dry. No rash noted. He is not diaphoretic. No pallor.  Psychiatric: He has a normal mood and affect.  Nursing note and vitals reviewed.    ED Treatments / Results  Labs (all labs ordered are listed, but only abnormal results are displayed) Labs Reviewed - No data to display  EKG  EKG Interpretation None       Radiology US Venous Img Lower Unilateral Right  Result Date: 05/28/2016 CLINICAL DATA:  Right calf pain with redness.  Possible insect bite.  EXAM: RIGHT LOWER EXTREMITY VENOUS DOPPLER ULTRASOUND TECHNIQUE: Gray-scale sonography with graded compression, as well as color Doppler and duplex ultrasound, were performed to evaluate the deep venous system from the level of the common femoral vein through the popliteal and proximal calf veins. Spectral Doppler was utilized to evaluate  flow at rest and with distal augmentation maneuvers. COMPARISON:  None. FINDINGS: Left common femoral vein is patent without thrombus. Normal compressibility, augmentation and color Doppler flow in the right common femoral vein, right femoral vein and right popliteal vein. The right saphenofemoral junction is patent. Right profunda femoral vein is patent without thrombus. Visualized right deep calf veins are patent without thrombus. Right great saphenous vein is compressible without thrombus. IMPRESSION: Negative for deep venous thrombosis in right lower extremity. Electronically Signed   By: Markus Daft M.D.   On: 05/28/2016 10:12    Procedures Procedures (including critical care time)  Medications Ordered in ED Medications - No data to display   Initial Impression / Assessment and Plan / ED Course  I have reviewed the triage vital signs and the nursing notes.  Pertinent labs & imaging results that were available during my care of the patient were reviewed by me and considered in my medical decision making (see chart for details).  Clinical Course     Venous ultrasound negative for DVT. Suspect local reaction to possible insect bite. Concern for cellulitis and will treat with Keflex. Patient advised to return if the redness is spreading or streaking up his leg, he develops a fever, or he develops worsening pain. Follow up to PCP for wound check in 2-3 days. Patient understands and agrees with plan. Patient vitals stable throughout ED course and discharged in satisfactory condition. Patient also evaluated by Dr. Oleta Mouse who guided the patient's management and agrees  with plan.  Final Clinical Impressions(s) / ED Diagnoses   Final diagnoses:  Right calf pain  Right leg pain    New Prescriptions Discharge Medication List as of 05/28/2016 10:34 AM    START taking these medications   Details  cephALEXin (KEFLEX) 500 MG capsule Take 1 capsule (500 mg total) by mouth 4 (four) times daily., Starting Thu 05/28/2016, [place], PA-C 05/28/16 [place] Liu, MD 05/28/16 [phone]

## 2016-05-28 NOTE — ED Triage Notes (Signed)
Right calf pain since Tuesday. States he was driving and felt a "needlelike pain" in his right calf. That evening he noticed a tendetr red area. States pain was better last night but this morning when he stood up he had pain in the entire calf

## 2016-05-28 NOTE — ED Notes (Signed)
ED Provider at bedside. 

## 2016-05-28 NOTE — ED Notes (Signed)
Pt d/c home with family member. Directed to pharmacy to pick up RX

## 2016-05-28 NOTE — ED Provider Notes (Signed)
Medical screening examination/treatment/procedure(s) were conducted as a shared visit with non-physician practitioner(s) and myself.  I personally evaluated the patient during the encounter.   EKG Interpretation None      82 year old male who presents with left leg pain. History of hypertension, hyperlipidemia, and coronary artery disease. States that 2 days ago while sitting down, noticed a sharp needle like pain to the left calf, lasted for few seconds, and went away. Thinks maybe he was bit by something as next day w/ rash to the Left calf and left leg. No fever or chills, dyspnea or chest pain. No fall or trauma. No pruritic nature but pain especially to area where there was small bump to calf, but now improved.  Is hemodynamically stable. No systemic signs or symptoms of illness. There is warmth, and patchy erythema noted along the anterior left lower leg and area that looks like a recent small bug bite to the left calf. NV intact leg.  No induration or underlying fluctuance to suggest abscess. Ultrasound of the lower extremity without DVT. We'll treat for potential cellulitis with course of Keflex. Strict return and follow-up instructions reviewed. He expressed understanding of all discharge instructions and felt comfortable with the plan of care.    Forde Dandy, MD 05/28/16 [phone]

## 2016-05-28 NOTE — Discharge Instructions (Signed)
Medications: Keflex  Treatment: Take Keflex 4 times daily as prescribed for 5 days. You can take Tylenol as prescribed over-the-counter for your pain.  Follow-up: Please follow-up with your primary care provider in 2 days for recheck. Please return to the emergency department if you develop any new or worsening symptoms including spreading of pain or redness, fevers, worsening pain, or any other concerning symptoms.

## 2016-05-31 ENCOUNTER — Emergency Department (HOSPITAL_BASED_OUTPATIENT_CLINIC_OR_DEPARTMENT_OTHER)
Admission: EM | Admit: 2016-05-31 | Discharge: 2016-05-31 | Disposition: A | Discharge status: Home / Self Care | Payer: Medicare Other | Attending: Emergency Medicine | Admitting: Emergency Medicine

## 2016-05-31 ENCOUNTER — Encounter (HOSPITAL_BASED_OUTPATIENT_CLINIC_OR_DEPARTMENT_OTHER): Payer: Self-pay | Admitting: *Deleted

## 2016-05-31 DIAGNOSIS — L03115 Cellulitis of right lower limb: Secondary | ICD-10-CM

## 2016-05-31 DIAGNOSIS — I10 Essential (primary) hypertension: Secondary | ICD-10-CM | POA: Diagnosis not present

## 2016-05-31 DIAGNOSIS — Z7982 Long term (current) use of aspirin: Secondary | ICD-10-CM | POA: Insufficient documentation

## 2016-05-31 DIAGNOSIS — Z87891 Personal history of nicotine dependence: Secondary | ICD-10-CM | POA: Diagnosis not present

## 2016-05-31 DIAGNOSIS — Z79899 Other long term (current) drug therapy: Secondary | ICD-10-CM | POA: Diagnosis not present

## 2016-05-31 LAB — BASIC METABOLIC PANEL
Anion gap: 9 (ref 5–15)
BUN: 24 mg/dL — ABNORMAL HIGH (ref 6–20)
CALCIUM: 9.5 mg/dL (ref 8.9–10.3)
CO2: 26 mmol/L (ref 22–32)
CREATININE: 1.62 mg/dL — AB (ref 0.61–1.24)
Chloride: 102 mmol/L (ref 101–111)
GFR, EST AFRICAN AMERICAN: 45 mL/min — AB (ref >60)
GFR, EST NON AFRICAN AMERICAN: 38 mL/min — AB (ref >60)
Glucose, Bld: 106 mg/dL — ABNORMAL HIGH (ref 65–99)
Potassium: 4.4 mmol/L (ref 3.5–5.1)
SODIUM: 137 mmol/L (ref 135–145)

## 2016-05-31 LAB — CBC
HCT: 40.1 % (ref 39.0–52.0)
Hemoglobin: 14.4 g/dL (ref 13.0–17.0)
MCH: 35.2 pg — ABNORMAL HIGH (ref 26.0–34.0)
MCHC: 35.9 g/dL (ref 30.0–36.0)
MCV: 98 fL (ref 78.0–100.0)
Platelets: 119 10*3/uL — ABNORMAL LOW (ref 150–400)
RBC: 4.09 MIL/uL — ABNORMAL LOW (ref 4.22–5.81)
RDW: 13.6 % (ref 11.5–15.5)
WBC: 6 10*3/uL (ref 4.0–10.5)

## 2016-05-31 MED ORDER — DOXYCYCLINE HYCLATE 100 MG PO TABS: 100.0000 mg | ORAL_TABLET | Freq: Two times a day (BID) | ORAL | 0 refills | Status: DC

## 2016-05-31 NOTE — Discharge Instructions (Signed)
Please stop the Keflex and instead take Doxycyline. Please follow up with your primary doctor as scheduled. If you notice fevers or continued worsening, please return. If no improvement with Doxycycline, you may require admission for IV Antibiotics.

## 2016-05-31 NOTE — ED Triage Notes (Signed)
Pt reports being seen here this past Thursday for cellulitis on R medial, lower leg. Reports he was prescribed Keflex and has been taking it as prescribed. Pt reports redness and swelling has spread. Denies, n/v/d.

## 2016-05-31 NOTE — ED Provider Notes (Signed)
[facility] DEPT [facility] Provider Note   CSN: [id number] Arrival date & time: 05/31/16  0856  History   Chief Complaint Chief Complaint  Patient presents with  . Cellulitis   HPI [name] is a 82 y.o. male.  HPI  Presenting for follow up of right calf cellulitis. Initially seen on 11/16 and was given course of Keflex with instructions to follow up if worsens. [place] was negative for DVT at that visit. Today reporting redness and swelling has spread and now involves the right ankle and most of the anterior right leg. Has noted increased swelling and pain as well. Was unable to bear weight on his right leg this morning due to the pain, but still reports normal ROM of right ankle. Denies fever, nausea, vomiting. Reports poor supply to lower extremities at baseline.  Past Medical History:  Diagnosis Date  . Anginal pain ([place])   . Anxiety    " OCCASIONAL"  . Arthritis   . Asthma due to seasonal allergies    uses inhalers prn  . Carotid artery occlusion    Left  . Complication of anesthesia    " had tremors after prostate surgery  . Coronary artery disease   . Diverticulitis    recurrent  . Family history of anesthesia complication    MOTHER   . GERD (gastroesophageal reflux disease)   . H/O hiatal hernia   . Hearing aid worn   . Hyperlipemia   . Hypertension   . Kidney stone    lithotripsy 4098 w complications, req stents, Dr Rosana Hoes  . Prostate CA ([place])   . Shortness of breath   . TIA (transient ischemic attack)   . Tinnitus     Patient Active Problem List   Diagnosis Date Noted  . Angina pectoris ([place]) 10/09/2015  . Coronary artery disease   . Hypertension   . Hyperlipemia   . Carotid artery occlusion   . Cerumen impaction 08/30/2015  . Dizziness 08/09/2015  . Gait instability 05/30/2015  . Myalgia   . Weakness   . Bloating   . Chest pain at rest 03/24/2015  . Atypical chest pain 03/24/2015  . Fatigue 03/14/2015  . Hypothyroidism 10/26/2013  . OSA  (obstructive sleep apnea) 10/18/2013  . Well adult health check 01/02/2013  . Other symptoms involving urinary system(788.99) 12/21/2012  . Benign localized hyperplasia of prostate with urinary obstruction and other lower urinary tract symptoms (LUTS)(600.21) 12/21/2012  . Carcinoma in situ of prostate 12/21/2012  . Left shoulder pain 08/08/2012  . Obesity 07/24/2011  . Generalized anxiety disorder 07/24/2011  . DIVERTICULOSIS OF COLON 12/25/2009  . DERMATITIS, SEBORRHEIC 12/25/2009  . 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 Memory loss 08/14/2009  . BACK PAIN, LUMBAR 07/19/2009  . PROSTATE CANCER 09/09/2006  . HYPERLIPIDEMIA 09/09/2006  . HYPERTENSION, BENIGN SYSTEMIC 09/09/2006  . Coronary atherosclerosis 09/09/2006  . VENOUS INSUFFICIENCY, CHRONIC 09/09/2006  . GASTROESOPHAGEAL REFLUX, NO ESOPHAGITIS 09/09/2006  . HERNIA, HIATAL, NONCONGENITAL 09/09/2006  . INSOMNIA NOS 09/09/2006    Past Surgical History:  Procedure Laterality Date  . CARDIAC CATHETERIZATION  2008   minimal dz, Dr Cathie Olden  . CARDIAC CATHETERIZATION N/A 10/09/2015   Procedure: Left Heart Cath and Coronary Angiography;  Surgeon: Peter M Martinique, MD;  Location: [facility];  Service: Cardiovascular;  Laterality: N/A;  . CARDIAC CATHETERIZATION N/A 10/09/2015   Procedure: Intravascular Pressure Wire/FFR Study;  Surgeon: Peter M Martinique, MD;  Location: [facility];  Service: Cardiovascular;  Laterality: N/A;  .  CARDIAC CATHETERIZATION N/A 10/09/2015   Procedure: Coronary Stent Intervention;  Surgeon: Peter M Martinique, MD;  Location: [facility];  Service: Cardiovascular;  Laterality: N/A;  . CAROTID ENDARTERECTOMY  Left   1998  . CHOLECYSTECTOMY    . CORONARY STENT PLACEMENT  10/09/2015   DES to RCA  . LEFT HEART CATHETERIZATION WITH CORONARY ANGIOGRAM N/A 09/15/2013   Procedure: LEFT HEART CATHETERIZATION WITH CORONARY ANGIOGRAM;  Surgeon: Peter M Martinique, MD;  Location: [facility] CATH LAB;  Service:  Cardiovascular;  Laterality: N/A;  . PROSTATECTOMY  1998   radical for prostate cancer       Home Medications    Prior to Admission medications   Medication Sig Start Date End Date Taking? Authorizing Provider  albuterol (PROVENTIL HFA) 108 (90 BASE) MCG/ACT inhaler Inhale 2 puffs into the lungs every 4 (four) hours as needed for wheezing. 11/13/13  Yes Leeanne Rio, MD  Artificial Tear Solution (BION TEARS OP) Place 1 drop into both eyes daily as needed (dry eyes).   Yes Historical Provider, MD  aspirin 81 MG tablet Take 1 tablet (81 mg total) by mouth daily. 10/10/15  Yes Arbutus Leas, NP  carvedilol (COREG) 6.25 MG tablet Take 1 tablet (6.25 mg total) by mouth 2 (two) times daily with a meal. 10/10/15  Yes Arbutus Leas, NP  clopidogrel (PLAVIX) 75 MG tablet Take 1 tablet (75 mg total) by mouth daily with breakfast. 10/10/15  Yes Arbutus Leas, NP  ezetimibe (ZETIA) 10 MG tablet Take 1 tablet (10 mg total) by mouth daily. 10/10/15  Yes Arbutus Leas, NP  folic acid (FOLVITE) 1 MG tablet Take 1 tablet (1 mg total) by mouth daily. 05/22/16  Yes Eliezer Bottom, NP  HYDROcodone-acetaminophen (NORCO/VICODIN) 5-325 MG tablet Take 0.5-1 tablets by mouth 2 (two) times daily as needed (pain). 05/11/16  Yes Jessica C Copland, MD  ipratropium (ATROVENT) 0.03 % nasal spray Place 2 sprays into the nose 4 (four) times daily. 05/11/16  Yes Jessica C Copland, MD  levothyroxine (SYNTHROID, LEVOTHROID) 100 MCG tablet Take 1 tablet by mouth daily. 01/20/16  Yes Historical Provider, MD  liothyronine (CYTOMEL) 5 MCG tablet Take 5 mcg by mouth every morning. 02/17/16  Yes Historical Provider, MD  losartan (COZAAR) 100 MG tablet Take 100 mg by mouth every other day. 03/10/16  Yes Historical Provider, MD  losartan-hydrochlorothiazide (HYZAAR) 100-12.5 MG tablet Take 1 tablet by mouth every other day.  01/24/16  Yes Historical Provider, MD  nitroGLYCERIN (NITROLINGUAL) 0.4 MG/SPRAY spray Place 1 spray under the  tongue every 5 (five) minutes x 3 doses as needed for chest pain. 03/18/16  Yes Almyra Deforest, PA  polyethylene glycol (MIRALAX / GLYCOLAX) packet Take 17 g by mouth daily. Mix in 8 oz liquid and drink   Yes Historical Provider, MD  triamcinolone cream (KENALOG) 0.1 % Apply 1 application topically daily as needed (rash).   Yes Historical Provider, MD  doxycycline (VIBRA-TABS) 100 MG tablet Take 1 tablet (100 mg total) by mouth 2 (two) times daily. 05/31/16   Lorna Few, DO    Family History Family History  Problem Relation Age of Onset  . Heart disease Mother   . Heart disease Father   . Kidney failure Father   . Heart disease Sister   . Heart attack Sister   . Dementia Sister   . Sjogren's syndrome Child   . Hashimoto's thyroiditis Child   . CAD Child     Social History  Social History  Substance Use Topics  . Smoking status: Former Smoker    Packs/day: 2.50    Years: 30.00    Types: Cigarettes    Quit date: 08/28/1985  . Smokeless tobacco: Never Used  . Alcohol use 1.2 oz/week    2 Standard drinks or equivalent per week     Comment: 1 glass wine/week (prior 1 glass martini with dinner)     Allergies   Ciprofloxacin; Codeine; Flexeril [cyclobenzaprine]; Methocarbamol; Sulfa antibiotics; Sulfamethoxazole; Sulfonamide derivatives; and Flagyl [metronidazole]   Review of Systems Review of Systems  Constitutional: Negative for fever.  Gastrointestinal: Negative for nausea and vomiting.  Musculoskeletal: Positive for gait problem.  Skin: Positive for color change and rash.     Physical Exam Updated Vital Signs BP 163/65 (BP Location: Left Arm)   Pulse (!) 57   Temp 97.6 F (36.4 C) (Oral)   Resp 18   Ht 5\' 6"  (1.676 m)   Wt 97.5 kg   SpO2 100%   BMI 34.70 kg/m   Physical Exam  Constitutional: He is oriented to person, place, and time. He appears well-developed and well-nourished. No distress.  HENT:  Head: Normocephalic and atraumatic.  Cardiovascular: Normal  rate.  Exam reveals no friction rub.   No murmur heard. Pulmonary/Chest: Effort normal. No respiratory distress. He has no wheezes.  Abdominal: Soft. He exhibits no distension. There is no tenderness.  Musculoskeletal:  Nonpitting edema of right lower extremity noted. Tenderness over right anterior lower extremity  Neurological: He is alert and oriented to person, place, and time. No sensory deficit.  Skin:  Increased warmth and erythema of right anterior and medial lower extremity and right lateral ankle   ED Treatments / Results  Labs (all labs ordered are listed, but only abnormal results are displayed) Labs Reviewed  CBC - Abnormal; Notable for the following:       Result Value   RBC 4.09 (*)    MCH 35.2 (*)    Platelets 119 (*)    All other components within normal limits  BASIC METABOLIC PANEL - Abnormal; Notable for the following:    Glucose, Bld 106 (*)    BUN 24 (*)    Creatinine, Ser 1.62 (*)    GFR calc non Af Amer 38 (*)    GFR calc Af Amer 45 (*)    All other components within normal limits    EKG  EKG Interpretation None      Radiology No results found.  Procedures Procedures (including critical care time)  Medications Ordered in ED Medications - No data to display   Initial Impression / Assessment and Plan / ED Course  I have reviewed the triage vital signs and the nursing notes.  Pertinent labs & imaging results that were available during my care of the patient were reviewed by me and considered in my medical decision making (see chart for details).  Clinical Course   - CMP with creatinine 1.62, otherwise normal. - CBC without leukocytosis.   Final Clinical Impressions(s) / ED Diagnoses   Final diagnoses:  Cellulitis of right lower extremity  Refuses hospitalization and would prefer to try a different antibiotic. Doxycycline prescribed. Already with appointment with PCP tomorrow. If no improvement or fevers develop, recommend hospitalization  for IV antibiotics.   New Prescriptions New Prescriptions   DOXYCYCLINE (VIBRA-TABS) 100 MG TABLET    Take 1 tablet (100 mg total) by mouth 2 (two) times daily.     Lorna Few, [place]  05/31/16 [place], MD 06/07/16 2026

## 2016-06-01 ENCOUNTER — Encounter: Payer: Self-pay | Admitting: Family Medicine

## 2016-06-01 ENCOUNTER — Ambulatory Visit (INDEPENDENT_AMBULATORY_CARE_PROVIDER_SITE_OTHER): Payer: Medicare Other | Admitting: Family Medicine

## 2016-06-01 VITALS — BP 116/82 | HR 72 | Temp 98.0°F | Ht 66.0 in | Wt 212.0 lb

## 2016-06-01 DIAGNOSIS — L03116 Cellulitis of left lower limb: Secondary | ICD-10-CM | POA: Diagnosis not present

## 2016-06-01 NOTE — Progress Notes (Signed)
[facility] [address], [place], [place] [address] [phone]  Date:  06/01/2016   Name:  HARJOT DIBELLO   DOB:  12/11/1933   MRN:  [id number]  PCP:  Lamar Blinks, MD    Chief Complaint: Hospitalization Follow-up (Pt was seen in the ER on both 11/16 and 11/19 for cellulitis right lower leg. )   History of Present Illness:  [name] is a 82 y.o. very pleasant male patient who presents with the following:  2 recent ER visits due to cellulitis of his right leg- started on keflex on 11/16, returned 11/19 (yesterday) and was changed to doxy due to worsening of his sx.    He is tolerating this medication and notes that he is doing much better since he changed his medication.  There redness and tenderness in his leg is much better  I did have him see hematology for thrombocytopenia- pt reports that he is not quite sure of his dx but in any case they seemed to think that there was nothing to worry about. He is seeing Burman Freestone upstairs next month for a follow-up visit.  They did not want to do a BMbx at last at this point. He may have a myeloproliferative disorder per notes No fever or chills His leg pain is much better No flu like sx  Patient Active Problem List   Diagnosis Date Noted  . Angina pectoris ([place]) 10/09/2015  . Coronary artery disease   . Hypertension   . Hyperlipemia   . Carotid artery occlusion   . Cerumen impaction 08/30/2015  . Dizziness 08/09/2015  . Gait instability 05/30/2015  . Myalgia   . Weakness   . Bloating   . Chest pain at rest 03/24/2015  . Atypical chest pain 03/24/2015  . Fatigue 03/14/2015  . Hypothyroidism 10/26/2013  . OSA (obstructive sleep apnea) 10/18/2013  . Well adult health check 01/02/2013  . Other symptoms involving urinary system(788.99) 12/21/2012  . Benign localized hyperplasia of prostate with urinary obstruction and other lower urinary tract symptoms (LUTS)(600.21)  12/21/2012  . Carcinoma in situ of prostate 12/21/2012  . Left shoulder pain 08/08/2012  . Obesity 07/24/2011  . Generalized anxiety disorder 07/24/2011  . DIVERTICULOSIS OF COLON 12/25/2009  . DERMATITIS, SEBORRHEIC 12/25/2009  . 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 Memory loss 08/14/2009  . BACK PAIN, LUMBAR 07/19/2009  . PROSTATE CANCER 09/09/2006  . HYPERLIPIDEMIA 09/09/2006  . HYPERTENSION, BENIGN SYSTEMIC 09/09/2006  . Coronary atherosclerosis 09/09/2006  . VENOUS INSUFFICIENCY, CHRONIC 09/09/2006  . GASTROESOPHAGEAL REFLUX, NO ESOPHAGITIS 09/09/2006  . HERNIA, HIATAL, NONCONGENITAL 09/09/2006  . INSOMNIA NOS 09/09/2006    Past Medical History:  Diagnosis Date  . Anginal pain ([place])   . Anxiety    " OCCASIONAL"  . Arthritis   . Asthma due to seasonal allergies    uses inhalers prn  . Carotid artery occlusion    Left  . Complication of anesthesia    " had tremors after prostate surgery  . Coronary artery disease   . Diverticulitis    recurrent  . Family history of anesthesia complication    MOTHER   . GERD (gastroesophageal reflux disease)   . H/O hiatal hernia   . Hearing aid worn   . Hyperlipemia   . Hypertension   . Kidney stone    lithotripsy 3295 w complications, req stents, Dr Rosana Hoes  . Prostate CA ([place])   . Shortness  of breath   . TIA (transient ischemic attack)   . Tinnitus     Past Surgical History:  Procedure Laterality Date  . CARDIAC CATHETERIZATION  2008   minimal dz, Dr Cathie Olden  . CARDIAC CATHETERIZATION N/A 10/09/2015   Procedure: Left Heart Cath and Coronary Angiography;  Surgeon: Peter M Martinique, MD;  Location: [facility];  Service: Cardiovascular;  Laterality: N/A;  . CARDIAC CATHETERIZATION N/A 10/09/2015   Procedure: Intravascular Pressure Wire/FFR Study;  Surgeon: Peter M Martinique, MD;  Location: [facility];  Service: Cardiovascular;  Laterality: N/A;  . CARDIAC CATHETERIZATION N/A 10/09/2015   Procedure: Coronary Stent  Intervention;  Surgeon: Peter M Martinique, MD;  Location: [facility] LAB;  Service: Cardiovascular;  Laterality: N/A;  . CAROTID ENDARTERECTOMY  Left   1998  . CHOLECYSTECTOMY    . CORONARY STENT PLACEMENT  10/09/2015   DES to RCA  . LEFT HEART CATHETERIZATION WITH CORONARY ANGIOGRAM N/A 09/15/2013   Procedure: LEFT HEART CATHETERIZATION WITH CORONARY ANGIOGRAM;  Surgeon: Peter M Martinique, MD;  Location: [facility] CATH LAB;  Service: Cardiovascular;  Laterality: N/A;  . PROSTATECTOMY  1998   radical for prostate cancer    Social History  Substance Use Topics  . Smoking status: Former Smoker    Packs/day: 2.50    Years: 30.00    Types: Cigarettes    Quit date: 08/28/1985  . Smokeless tobacco: Never Used  . Alcohol use 1.2 oz/week    2 Standard drinks or equivalent per week     Comment: 1 glass wine/week (prior 1 glass martini with dinner)    Family History  Problem Relation Age of Onset  . Heart disease Mother   . Heart disease Father   . Kidney failure Father   . Heart disease Sister   . Heart attack Sister   . Dementia Sister   . Sjogren's syndrome Child   . Hashimoto's thyroiditis Child   . CAD Child     Allergies  Allergen Reactions  . Ciprofloxacin Other (See Comments)    Hallucinations or jittery Hallucinations or jittery  . Codeine Other (See Comments)    hallucinations hallucinations  . Flexeril [Cyclobenzaprine]   . Methocarbamol   . Sulfa Antibiotics Other (See Comments)    Chills and shaking "serum sickness" Chills and shaking "serum sickness"  . Sulfamethoxazole Other (See Comments)    UNKNOWN REACTION, NOT DOCUMENTED  . Sulfonamide Derivatives Other (See Comments)    Chills and shaking "serum sickness"  . Flagyl [Metronidazole] Rash    Medication list has been reviewed and updated.  Current Outpatient Prescriptions on File Prior to Visit  Medication Sig Dispense Refill  . albuterol (PROVENTIL HFA) 108 (90 BASE) MCG/ACT inhaler Inhale 2 puffs into the  lungs every 4 (four) hours as needed for wheezing. 18 g 0  . Artificial Tear Solution (BION TEARS OP) Place 1 drop into both eyes daily as needed (dry eyes).    Marland Kitchen aspirin 81 MG tablet Take 1 tablet (81 mg total) by mouth daily. 30 tablet 12  . carvedilol (COREG) 6.25 MG tablet Take 1 tablet (6.25 mg total) by mouth 2 (two) times daily with a meal. 180 tablet 3  . clopidogrel (PLAVIX) 75 MG tablet Take 1 tablet (75 mg total) by mouth daily with breakfast. 90 tablet 3  . doxycycline (VIBRA-TABS) 100 MG tablet Take 1 tablet (100 mg total) by mouth 2 (two) times daily. 20 tablet 0  . ezetimibe (ZETIA) 10 MG tablet  Take 1 tablet (10 mg total) by mouth daily. 90 tablet 3  . folic acid (FOLVITE) 1 MG tablet Take 1 tablet (1 mg total) by mouth daily. 30 tablet 4  . HYDROcodone-acetaminophen (NORCO/VICODIN) 5-325 MG tablet Take 0.5-1 tablets by mouth 2 (two) times daily as needed (pain). 60 tablet 0  . ipratropium (ATROVENT) 0.03 % nasal spray Place 2 sprays into the nose 4 (four) times daily. 30 mL 6  . levothyroxine (SYNTHROID, LEVOTHROID) 100 MCG tablet Take 1 tablet by mouth daily.  1  . liothyronine (CYTOMEL) 5 MCG tablet Take 5 mcg by mouth every morning.  6  . losartan (COZAAR) 100 MG tablet Take 100 mg by mouth every other day.  3  . losartan-hydrochlorothiazide (HYZAAR) 100-12.5 MG tablet Take 1 tablet by mouth every other day.   3  . nitroGLYCERIN (NITROLINGUAL) 0.4 MG/SPRAY spray Place 1 spray under the tongue every 5 (five) minutes x 3 doses as needed for chest pain. 12 g 12  . polyethylene glycol (MIRALAX / GLYCOLAX) packet Take 17 g by mouth daily. Mix in 8 oz liquid and drink    . triamcinolone cream (KENALOG) 0.1 % Apply 1 application topically daily as needed (rash).     No current facility-administered medications on file prior to visit.     Review of Systems:  As per HPI- otherwise negative.  No fever, chills, nausea, vomiting.  Leg pain is much better   Physical  Examination: Vitals:   06/01/16 1508  BP: 116/82  Pulse: 72  Temp: 98 F (36.7 C)   Vitals:   06/01/16 1508  Weight: 212 lb (96.2 kg)  Height: 5\' 6"  (1.676 m)   Body mass index is 34.22 kg/m. Ideal Body Weight: Weight in (lb) to have BMI = 25: 154.6  GEN: WDWN, NAD, Non-toxic, A & O x 3, obese, here today with his wife. He is somewhat HOH, looks well HEENT: Atraumatic, Normocephalic. Neck supple. No masses, No LAD. Ears and Nose: No external deformity. CV: RRR, No M/G/R. No JVD. No thrill. No extra heart sounds. PULM: CTA B, no wheezes, crackles, rhonchi. No retractions. No resp. distress. No accessory muscle use. EXTR: No c/c/e NEURO Normal gait.  PSYCH: Normally interactive. Conversant. Not depressed or anxious appearing.  Calm demeanor.  He has pictures on his phone of his right leg from the last few days.  At that time he had scattered discrete red nodules which are today much improved and in some cases resolved.  He has minimal swelling of the dorsal left foot   Assessment and Plan: Cellulitis of leg, left  His cellulitis seems to be responding well to the doxycycline.  They will alert me if he does not continue to do well  Signed Lamar Blinks, MD

## 2016-06-01 NOTE — Patient Instructions (Addendum)
Your leg appears to be getting well- continue using the doxycycline twice a day as you were directed.  Let me know if your leg starts to get worse again.

## 2016-06-01 NOTE — Progress Notes (Signed)
Pre visit review using our clinic review tool, if applicable. No additional management support is needed unless otherwise documented below in the visit note. 

## 2016-06-16 NOTE — Addendum Note (Signed)
Addended by: Lianne Cure A on: 06/16/2016 10:56 AM   Modules accepted: Orders

## 2016-06-28 NOTE — Progress Notes (Signed)
[name] Date of Birth: 11-15-1933 Medical Record #[id number]  History of Present Illness: Mr.Voges is seen for follow up CAD.  He has a known history of CAD from evaluation in 2015. He was  hospitalized with atypical chest pain. He ruled out for MI. Cardiac cath was done and demonstrated a 70% stenosis of the proximal RCA. FFR was 0.82. It was felt that this lesion was not flow limiting and he was treated medically. In March he presented with class 3 angina despite good medical therapy. Cardiac cath repeated and showed a 70% proximal RCA stenosis. FFR at this time was abnormal at 0.76 and the lesion was stented with DES.   He was admitted in September 2016 with generalized weakness and arthralgias. This was felt to be related to statins and this was stopped. He is now on Zetia. He is on thyroid replacement now.  He is followed by Dr. Scot Dock for carotid disease and is s/p left CEA. Evaluation in Dec 2016 showed an 70-80% right ICA stenosis. This is being followed closely.   On follow up today he reports he developed cellulitis in his right leg one month ago with a lot of swelling, erythema, and heat. He was treated with antibiotics and this has improved. He denies any chest pain or SOB. Hasn't been exercising due to cellulitis.   Current Outpatient Prescriptions on File Prior to Visit  Medication Sig Dispense Refill  . albuterol (PROVENTIL HFA) 108 (90 BASE) MCG/ACT inhaler Inhale 2 puffs into the lungs every 4 (four) hours as needed for wheezing. 18 g 0  . Artificial Tear Solution (BION TEARS OP) Place 1 drop into both eyes daily as needed (dry eyes).    Marland Kitchen aspirin 81 MG tablet Take 1 tablet (81 mg total) by mouth daily. 30 tablet 12  . carvedilol (COREG) 6.25 MG tablet Take 1 tablet (6.25 mg total) by mouth 2 (two) times daily with a meal. 180 tablet 3  . clopidogrel (PLAVIX) 75 MG tablet Take 1 tablet (75 mg total) by mouth daily with breakfast. 90 tablet 3  . doxycycline (VIBRA-TABS) 100  MG tablet Take 1 tablet (100 mg total) by mouth 2 (two) times daily. 20 tablet 0  . ezetimibe (ZETIA) 10 MG tablet Take 1 tablet (10 mg total) by mouth daily. 90 tablet 3  . HYDROcodone-acetaminophen (NORCO/VICODIN) 5-325 MG tablet Take 0.5-1 tablets by mouth 2 (two) times daily as needed (pain). 60 tablet 0  . ipratropium (ATROVENT) 0.03 % nasal spray Place 2 sprays into the nose 4 (four) times daily. 30 mL 6  . levothyroxine (SYNTHROID, LEVOTHROID) 100 MCG tablet Take 1 tablet by mouth daily.  1  . liothyronine (CYTOMEL) 5 MCG tablet Take 5 mcg by mouth every morning.  6  . losartan-hydrochlorothiazide (HYZAAR) 100-12.5 MG tablet Take 1 tablet by mouth every other day.   3  . nitroGLYCERIN (NITROLINGUAL) 0.4 MG/SPRAY spray Place 1 spray under the tongue every 5 (five) minutes x 3 doses as needed for chest pain. 12 g 12  . polyethylene glycol (MIRALAX / GLYCOLAX) packet Take 17 g by mouth daily. Mix in 8 oz liquid and drink    . triamcinolone cream (KENALOG) 0.1 % Apply 1 application topically daily as needed (rash).     No current facility-administered medications on file prior to visit.     Allergies  Allergen Reactions  . Ciprofloxacin Other (See Comments)    Hallucinations or jittery Hallucinations or jittery  . Codeine Other (See Comments)  hallucinations hallucinations  . Flexeril [Cyclobenzaprine]   . Methocarbamol   . Sulfa Antibiotics Other (See Comments)    Chills and shaking "serum sickness" Chills and shaking "serum sickness"  . Sulfamethoxazole Other (See Comments)    UNKNOWN REACTION, NOT DOCUMENTED  . Sulfonamide Derivatives Other (See Comments)    Chills and shaking "serum sickness"  . Flagyl [Metronidazole] Rash    Past Medical History:  Diagnosis Date  . Anginal pain ([place])   . Anxiety    " OCCASIONAL"  . Arthritis   . Asthma due to seasonal allergies    uses inhalers prn  . Carotid artery occlusion    Left  . Complication of anesthesia    " had  tremors after prostate surgery  . Coronary artery disease   . Diverticulitis    recurrent  . Family history of anesthesia complication    MOTHER   . GERD (gastroesophageal reflux disease)   . H/O hiatal hernia   . Hearing aid worn   . Hyperlipemia   . Hypertension   . Kidney stone    lithotripsy 2637 w complications, req stents, Dr Rosana Hoes  . Prostate CA ([place])   . Shortness of breath   . TIA (transient ischemic attack)   . Tinnitus     Past Surgical History:  Procedure Laterality Date  . CARDIAC CATHETERIZATION  2008   minimal dz, Dr Cathie Olden  . CARDIAC CATHETERIZATION N/A 10/09/2015   Procedure: Left Heart Cath and Coronary Angiography;  Surgeon: Aleshka Corney M Martinique, MD;  Location: [facility];  Service: Cardiovascular;  Laterality: N/A;  . CARDIAC CATHETERIZATION N/A 10/09/2015   Procedure: Intravascular Pressure Wire/FFR Study;  Surgeon: Coden Franchi M Martinique, MD;  Location: [facility];  Service: Cardiovascular;  Laterality: N/A;  . CARDIAC CATHETERIZATION N/A 10/09/2015   Procedure: Coronary Stent Intervention;  Surgeon: Algie Cales M Martinique, MD;  Location: [facility];  Service: Cardiovascular;  Laterality: N/A;  . CAROTID ENDARTERECTOMY  Left   1998  . CHOLECYSTECTOMY    . CORONARY STENT PLACEMENT  10/09/2015   DES to RCA  . LEFT HEART CATHETERIZATION WITH CORONARY ANGIOGRAM N/A 09/15/2013   Procedure: LEFT HEART CATHETERIZATION WITH CORONARY ANGIOGRAM;  Surgeon: Lee-Anne Flicker M Martinique, MD;  Location: [facility];  Service: Cardiovascular;  Laterality: N/A;  . PROSTATECTOMY  1998   radical for prostate cancer    History  Smoking Status  . Former Smoker  . Packs/day: 2.50  . Years: 30.00  . Types: Cigarettes  . Quit date: 08/28/1985  Smokeless Tobacco  . Never Used    History  Alcohol Use  . 1.2 oz/week  . 2 Standard drinks or equivalent per week    Comment: 1 glass wine/week (prior 1 glass martini with dinner)    Family History  Problem Relation Age of Onset  .  Heart disease Mother   . Heart disease Father   . Kidney failure Father   . Heart disease Sister   . Heart attack Sister   . Dementia Sister   . Sjogren's syndrome Child   . Hashimoto's thyroiditis Child   . CAD Child     Review of Systems: As noted in HPI.  All other systems were reviewed and are negative.  Physical Exam: BP 110/70 (BP Location: Right Arm, Patient Position: Sitting, Cuff Size: Normal)   Pulse 61   Ht 5\' 6"  (1.676 m)   Wt 216 lb 12.8 oz (98.3 kg)   SpO2 97%   BMI  34.99 kg/m  Obese WM in NAD HEENT: [place]/AT,PERRL, EOMI, oropharynx is clear.  Neck: supple without JVD, adenopathy, or bruits. Lungs: clear CV: RRR, normal S1-2, no gallop.  Soft 1/6 systolic murmur at the apex Abd: soft NT, BS +. No HSM or masses. Ext: no edema. Normal pulses. Right leg with trace edema. No redness.  Neuro: nonfocal.  LABORATORY DATA: Lab Results  Component Value Date   WBC 6.0 05/31/2016   HGB 14.4 05/31/2016   HCT 40.1 05/31/2016   PLT 119 (L) 05/31/2016   GLUCOSE 106 (H) 05/31/2016   CHOL 152 01/28/2016   TRIG 150 (H) 01/28/2016   HDL 40 01/28/2016   LDLDIRECT 70 12/21/2012   LDLCALC 82 01/28/2016   ALT 26 01/28/2016   AST 23 01/28/2016   NA 137 05/31/2016   K 4.4 05/31/2016   CL 102 05/31/2016   CREATININE 1.62 (H) 05/31/2016   BUN 24 (H) 05/31/2016   CO2 26 05/31/2016   TSH 2.13 05/11/2016   PSA 0.00 Repeated and verified X2. (L) 05/11/2016   INR 1.04 10/02/2015   HGBA1C 5.4 03/24/2015   Labs done by Primary care dated 04/15/16: Normal WBC and Hgb. Platelets 137K. BUN 22, creatinine 1.57. Other chemistries normal.   Assessment / Plan: 1. CAD with moderate proximal RCA stenosis in 2015. Repeat cardiac cath in March 2017 showed 70% lesion with abnormal FFR and he underwent successful stenting with DES. Plan DAPT for one year. Now with class 1 angina.Resume  exercise program and weight loss.   2. Dyslipidemia. Statin intolerant. On Zetia. Lipids looked ok.  3.  HTN controlled.  4. Carotid arterial disease s/p LCEA. 80% RICA stenosis. Asymptomatic. Followed at [facility].   5. Cellulitis resolved.  I will follow up in 6 months.

## 2016-06-30 ENCOUNTER — Ambulatory Visit (INDEPENDENT_AMBULATORY_CARE_PROVIDER_SITE_OTHER): Payer: Medicare Other | Admitting: Cardiology

## 2016-06-30 ENCOUNTER — Encounter: Payer: Self-pay | Admitting: Cardiology

## 2016-06-30 VITALS — BP 110/70 | HR 61 | Ht 66.0 in | Wt 216.8 lb

## 2016-06-30 DIAGNOSIS — I6521 Occlusion and stenosis of right carotid artery: Secondary | ICD-10-CM

## 2016-06-30 DIAGNOSIS — I2510 Atherosclerotic heart disease of native coronary artery without angina pectoris: Secondary | ICD-10-CM

## 2016-06-30 DIAGNOSIS — E7800 Pure hypercholesterolemia, unspecified: Secondary | ICD-10-CM | POA: Diagnosis not present

## 2016-06-30 DIAGNOSIS — I10 Essential (primary) hypertension: Secondary | ICD-10-CM | POA: Diagnosis not present

## 2016-06-30 NOTE — Patient Instructions (Signed)
Continue your current therapy  I will see you in 6 months.   

## 2016-07-16 ENCOUNTER — Encounter: Payer: Self-pay | Admitting: Family Medicine

## 2016-07-16 ENCOUNTER — Other Ambulatory Visit: Payer: Self-pay | Admitting: Family Medicine

## 2016-07-16 ENCOUNTER — Telehealth: Payer: Self-pay | Admitting: Family Medicine

## 2016-07-16 DIAGNOSIS — G8929 Other chronic pain: Secondary | ICD-10-CM

## 2016-07-16 DIAGNOSIS — E039 Hypothyroidism, unspecified: Secondary | ICD-10-CM

## 2016-07-16 DIAGNOSIS — M25562 Pain in left knee: Principal | ICD-10-CM

## 2016-07-16 MED ORDER — HYDROCODONE-ACETAMINOPHEN 5-325 MG PO TABS: 0.5000 | ORAL_TABLET | Freq: Two times a day (BID) | ORAL | 0 refills | Status: DC | PRN

## 2016-07-16 NOTE — Telephone Encounter (Signed)
He uses occasional hydrocodone for knee pain- reviewed NCCSR, no unexpected entries

## 2016-07-16 NOTE — Telephone Encounter (Signed)
Pt is requesting refill on Hydrocodone.   Last OV: 05/11/2016 Last Fill: 05/11/2016 #60 and 0RF

## 2016-07-22 ENCOUNTER — Other Ambulatory Visit (HOSPITAL_BASED_OUTPATIENT_CLINIC_OR_DEPARTMENT_OTHER): Payer: Medicare Other

## 2016-07-22 ENCOUNTER — Ambulatory Visit (HOSPITAL_BASED_OUTPATIENT_CLINIC_OR_DEPARTMENT_OTHER): Payer: Medicare Other | Admitting: Family

## 2016-07-22 ENCOUNTER — Ambulatory Visit (HOSPITAL_BASED_OUTPATIENT_CLINIC_OR_DEPARTMENT_OTHER)
Admission: RE | Admit: 2016-07-22 | Discharge: 2016-07-22 | Disposition: A | Discharge status: Home / Self Care | Payer: Medicare Other | Source: Ambulatory Visit | Attending: Family | Admitting: Family

## 2016-07-22 DIAGNOSIS — R109 Unspecified abdominal pain: Secondary | ICD-10-CM | POA: Diagnosis not present

## 2016-07-22 DIAGNOSIS — R7989 Other specified abnormal findings of blood chemistry: Secondary | ICD-10-CM

## 2016-07-22 DIAGNOSIS — D721 Eosinophilia: Secondary | ICD-10-CM

## 2016-07-22 DIAGNOSIS — R748 Abnormal levels of other serum enzymes: Secondary | ICD-10-CM | POA: Diagnosis not present

## 2016-07-22 DIAGNOSIS — R898 Other abnormal findings in specimens from other organs, systems and tissues: Secondary | ICD-10-CM

## 2016-07-22 DIAGNOSIS — E538 Deficiency of other specified B group vitamins: Secondary | ICD-10-CM

## 2016-07-22 DIAGNOSIS — D509 Iron deficiency anemia, unspecified: Secondary | ICD-10-CM

## 2016-07-22 DIAGNOSIS — R1084 Generalized abdominal pain: Secondary | ICD-10-CM

## 2016-07-22 DIAGNOSIS — B372 Candidiasis of skin and nail: Secondary | ICD-10-CM

## 2016-07-22 DIAGNOSIS — D696 Thrombocytopenia, unspecified: Secondary | ICD-10-CM

## 2016-07-22 DIAGNOSIS — C61 Malignant neoplasm of prostate: Secondary | ICD-10-CM

## 2016-07-22 LAB — COMPREHENSIVE METABOLIC PANEL
ALK PHOS: 67 U/L (ref 40–150)
ALT: 31 U/L (ref 0–55)
ANION GAP: 10 meq/L (ref 3–11)
AST: 24 U/L (ref 5–34)
Albumin: 3.9 g/dL (ref 3.5–5.0)
BILIRUBIN TOTAL: 1.4 mg/dL — AB (ref 0.20–1.20)
BUN: 28.8 mg/dL — ABNORMAL HIGH (ref 7.0–26.0)
CO2: 25 mEq/L (ref 22–29)
Calcium: 9.4 mg/dL (ref 8.4–10.4)
Chloride: 105 mEq/L (ref 98–109)
Creatinine: 1.9 mg/dL — ABNORMAL HIGH (ref 0.7–1.3)
EGFR: 32 mL/min/{1.73_m2} — ABNORMAL LOW (ref >90)
GLUCOSE: 103 mg/dL (ref 70–140)
Potassium: 4.2 mEq/L (ref 3.5–5.1)
Sodium: 140 mEq/L (ref 136–145)
Total Protein: 7.3 g/dL (ref 6.4–8.3)

## 2016-07-22 LAB — IRON AND TIBC
%SAT: 38 % (ref 20–55)
IRON: 106 ug/dL (ref 42–163)
TIBC: 281 ug/dL (ref 202–409)
UIBC: 175 ug/dL (ref 117–376)

## 2016-07-22 LAB — TECHNOLOGIST REVIEW CHCC SATELLITE

## 2016-07-22 LAB — CBC WITH DIFFERENTIAL (CANCER CENTER ONLY)
BASO#: 0.2 10*3/uL (ref 0.0–0.2)
BASO%: 3.1 % — AB (ref 0.0–2.0)
EOS%: 12.2 % — AB (ref 0.0–7.0)
Eosinophils Absolute: 0.6 10*3/uL — ABNORMAL HIGH (ref 0.0–0.5)
HCT: 38.9 % (ref 38.7–49.9)
HGB: 14.2 g/dL (ref 13.0–17.1)
LYMPH#: 1.4 10*3/uL (ref 0.9–3.3)
LYMPH%: 26.5 % (ref 14.0–48.0)
MCH: 35.9 pg — ABNORMAL HIGH (ref 28.0–33.4)
MCHC: 36.5 g/dL — ABNORMAL HIGH (ref 32.0–35.9)
MCV: 98 fL (ref 82–98)
MONO#: 0.7 10*3/uL (ref 0.1–0.9)
MONO%: 13 % (ref 0.0–13.0)
NEUT#: 2.3 10*3/uL (ref 1.5–6.5)
NEUT%: 45.2 % (ref 40.0–80.0)
PLATELETS: 126 10*3/uL — AB (ref 145–400)
RBC: 3.96 10*6/uL — ABNORMAL LOW (ref 4.20–5.70)
RDW: 13.4 % (ref 11.1–15.7)
WBC: 5.1 10*3/uL (ref 4.0–10.0)

## 2016-07-22 LAB — FERRITIN: Ferritin: 185 ng/ml (ref 22–316)

## 2016-07-22 MED ORDER — NYSTATIN 100000 UNIT/GM EX POWD
Freq: Three times a day (TID) | CUTANEOUS | 2 refills | Status: DC
Start: 2016-07-22 — End: 2016-11-30

## 2016-07-22 MED ORDER — FLUCONAZOLE 150 MG PO TABS: 150.0000 mg | ORAL_TABLET | ORAL | 0 refills | Status: DC

## 2016-07-22 NOTE — Progress Notes (Signed)
Hematology and Oncology Follow Up Visit  [name] [id number] 11-21-1933 83 y.o. 07/22/2016   Principle Diagnosis:  Elevated serum B 12 Thrombocytopenia   Current Therapy:   Folic acid 1 mg PO daily    Interim History:  [name] is here today for follow-up. He is still feeling fatigued and states that he "sleeps all the time." He tried taking folic acid but states that it causes him to have aches and pains "all over" so he has been taking on and off.  He has bloating around his abdomen and some tenderness on palpation of the left and right upper quadrants. [place] today showed hepatic steatosis and mildly atrophic right kidney. I have forwarded these results to his PCP.  He still has a rash that itches and burns on both legs and feet. He has tried two antibiotics without relief. With the itching and burning we will try treating him for athletes foot and see if he has any improvement.  No numbness or tingling in his extremities. He has generalized arthritic pain that comes and goes and is exacerbated by the weather.  No fever, chills, n/v, cough, dizziness, headache, SOB, palpitations or changes in bowel or bladder habits.  He has occasional chest pain that so far has resolved with nitro. He is followed by cardiology and knows that if his chest pain does not improve to call EMS immediately.  He has maintained a good appetite but admits that he needs to drink more fluids. His weight is stable.  He plans to try going to the gym more frequently to help build up his stamina and energy.   Medications:  Allergies as of 07/22/2016      Reactions   Ciprofloxacin Other (See Comments)   Hallucinations or jittery Hallucinations or jittery   Codeine Other (See Comments)   hallucinations hallucinations   Flexeril [cyclobenzaprine]    Methocarbamol    Sulfa Antibiotics Other (See Comments)   Chills and shaking "serum sickness" Chills and shaking "serum sickness"   Sulfamethoxazole Other (See  Comments)   UNKNOWN REACTION, NOT DOCUMENTED   Sulfonamide Derivatives Other (See Comments)   Chills and shaking "serum sickness"   Flagyl [metronidazole] Rash      Medication List       Accurate as of 07/22/16 11:26 AM. Always use your most recent med list.          albuterol 108 (90 Base) MCG/ACT inhaler Commonly known as:  PROVENTIL HFA Inhale 2 puffs into the lungs every 4 (four) hours as needed for wheezing.   aspirin 81 MG tablet Take 1 tablet (81 mg total) by mouth daily.   BION TEARS OP Place 1 drop into both eyes daily as needed (dry eyes).   carvedilol 6.25 MG tablet Commonly known as:  COREG Take 1 tablet (6.25 mg total) by mouth 2 (two) times daily with a meal.   clopidogrel 75 MG tablet Commonly known as:  PLAVIX Take 1 tablet (75 mg total) by mouth daily with breakfast.   doxycycline 100 MG tablet Commonly known as:  VIBRA-TABS Take 1 tablet (100 mg total) by mouth 2 (two) times daily.   ezetimibe 10 MG tablet Commonly known as:  ZETIA Take 1 tablet (10 mg total) by mouth daily.   HYDROcodone-acetaminophen 5-325 MG tablet Commonly known as:  NORCO/VICODIN Take 0.5-1 tablets by mouth 2 (two) times daily as needed (pain).   ipratropium 0.03 % nasal spray Commonly known as:  ATROVENT Place 2 sprays into the  nose 4 (four) times daily.   levothyroxine 100 MCG tablet Commonly known as:  SYNTHROID, LEVOTHROID TAKE 1 TABLET EVERY DAY IN THE MORNING.   liothyronine 5 MCG tablet Commonly known as:  CYTOMEL Take 5 mcg by mouth every morning.   losartan-hydrochlorothiazide 100-12.5 MG tablet Commonly known as:  HYZAAR Take 1 tablet by mouth every other day.   nitroGLYCERIN 0.4 MG/SPRAY spray Commonly known as:  NITROLINGUAL Place 1 spray under the tongue every 5 (five) minutes x 3 doses as needed for chest pain.   polyethylene glycol packet Commonly known as:  MIRALAX / GLYCOLAX Take 17 g by mouth daily. Mix in 8 oz liquid and drink     triamcinolone cream 0.1 % Commonly known as:  KENALOG Apply 1 application topically daily as needed (rash).       Allergies:  Allergies  Allergen Reactions  . Ciprofloxacin Other (See Comments)    Hallucinations or jittery Hallucinations or jittery  . Codeine Other (See Comments)    hallucinations hallucinations  . Flexeril [Cyclobenzaprine]   . Methocarbamol   . Sulfa Antibiotics Other (See Comments)    Chills and shaking "serum sickness" Chills and shaking "serum sickness"  . Sulfamethoxazole Other (See Comments)    UNKNOWN REACTION, NOT DOCUMENTED  . Sulfonamide Derivatives Other (See Comments)    Chills and shaking "serum sickness"  . Flagyl [Metronidazole] Rash    Past Medical History, Surgical history, Social history, and Family History were reviewed and updated.  Review of Systems: All other 10 point review of systems is negative.   Physical Exam:  vitals were not taken for this visit.  Wt Readings from Last 3 Encounters:  06/30/16 216 lb 12.8 oz (98.3 kg)  06/01/16 212 lb (96.2 kg)  05/31/16 215 lb (97.5 kg)    Ocular: Sclerae unicteric, pupils equal, round and reactive to light Ear-nose-throat: Oropharynx clear, dentition fair Lymphatic: No cervical supraclavicular or axillary adenopathy Lungs no rales or rhonchi, good excursion bilaterally Heart regular rate and rhythm, no murmur appreciated Abd soft, nontender, positive bowel sounds, no liver or spleen tip palpated on exam, no fluid wave MSK no focal spinal tenderness, no joint edema Neuro: non-focal, well-oriented, appropriate affect Breasts: Deferred  Lab Results  Component Value Date   WBC 5.1 07/22/2016   HGB 14.2 07/22/2016   HCT 38.9 07/22/2016   MCV 98 07/22/2016   PLT 126 (L) 07/22/2016   Lab Results  Component Value Date   FERRITIN 151 03/24/2015   IRON 39 (L) 03/24/2015   TIBC 272 03/24/2015   UIBC 233 03/24/2015   IRONPCTSAT 14 (L) 03/24/2015   Lab Results  Component Value  Date   RETICCTPCT 2.6 03/24/2015   RBC 3.96 (L) 07/22/2016   No results found for: Nils Pyle [facility] Lab Results  Component Value Date   IGGSERUM 1,180 06/17/2015   IGA 385 (H) 06/17/2015   IGMSERUM 68 06/17/2015   Lab Results  Component Value Date   TOTALPROTELP 7.0 06/17/2015   ALBUMINELP 4.1 06/17/2015   A1GS 0.2 06/17/2015   A2GS 0.6 06/17/2015   BETS 0.4 06/17/2015   BETA2SER 0.4 06/17/2015   GAMS 1.2 06/17/2015   SPEI SEE NOTE 06/17/2015     Chemistry      Component Value Date/Time   NA 137 05/31/2016 1013   NA 143 01/28/2016 0759   K 4.4 05/31/2016 1013   CL 102 05/31/2016 1013   CO2 26 05/31/2016 1013   BUN 24 (H) 05/31/2016 1013   BUN  19 01/28/2016 0759   CREATININE 1.62 (H) 05/31/2016 1013   CREATININE 1.62 (H) 10/02/2015 0954      Component Value Date/Time   CALCIUM 9.5 05/31/2016 1013   ALKPHOS 60 01/28/2016 0759   AST 23 01/28/2016 0759   ALT 26 01/28/2016 0759   BILITOT 1.2 01/28/2016 0759     Impression and Plan: Mr. Romey is a very pleasant 83 yo white male with history of an elevated serum B 12 level symptomatic with fatigue. His B 12 remains elevated at >2000. He was unable to tolerate folic acid.  His testosterone and iron studies were within normal limits.  His eosinophil count is still mildly elevated so a JAK-2 was was added to his lab work and results are pending.  For what appears to be an itching/burning fungal infection of both feet and legs we will have him try Diflucan 150 mg PO once a week for 4 weeks. I also gave him a prescription for Nystatin powder for his feet.  Abdominal US for bloating and pain completed today showed hepatic steatosis and mildly atrophic right kidney.  He continues to be followed by cardiology for his heart issues.  We will plan to see him back in 4 monthd for repeat lab work and follow-up.  He will contact our office with any questions or concerns. We can certainly see him sooner if need be.    Eliezer Bottom, NP 1/10/201811:26 AM

## 2016-07-23 LAB — TESTOSTERONE: TESTOSTERONE: 424 ng/dL (ref 264–916)

## 2016-07-23 LAB — VITAMIN B12: Vitamin B12: >2000 pg/mL — ABNORMAL HIGH (ref 232–1245)

## 2016-07-31 ENCOUNTER — Encounter (HOSPITAL_BASED_OUTPATIENT_CLINIC_OR_DEPARTMENT_OTHER): Payer: Self-pay

## 2016-07-31 ENCOUNTER — Emergency Department (HOSPITAL_BASED_OUTPATIENT_CLINIC_OR_DEPARTMENT_OTHER)
Admission: EM | Admit: 2016-07-31 | Discharge: 2016-08-01 | Disposition: A | Discharge status: Home / Self Care | Payer: Medicare Other | Attending: Emergency Medicine | Admitting: Emergency Medicine

## 2016-07-31 DIAGNOSIS — Z87891 Personal history of nicotine dependence: Secondary | ICD-10-CM | POA: Diagnosis not present

## 2016-07-31 DIAGNOSIS — J45909 Unspecified asthma, uncomplicated: Secondary | ICD-10-CM | POA: Diagnosis not present

## 2016-07-31 DIAGNOSIS — L7621 Postprocedural hemorrhage and hematoma of skin and subcutaneous tissue following a dermatologic procedure: Secondary | ICD-10-CM | POA: Diagnosis not present

## 2016-07-31 DIAGNOSIS — T148XXA Other injury of unspecified body region, initial encounter: Secondary | ICD-10-CM

## 2016-07-31 DIAGNOSIS — Z79899 Other long term (current) drug therapy: Secondary | ICD-10-CM | POA: Diagnosis not present

## 2016-07-31 DIAGNOSIS — Z7982 Long term (current) use of aspirin: Secondary | ICD-10-CM | POA: Insufficient documentation

## 2016-07-31 DIAGNOSIS — I10 Essential (primary) hypertension: Secondary | ICD-10-CM | POA: Insufficient documentation

## 2016-07-31 NOTE — ED Triage Notes (Signed)
Pt states he had ingrown toenail removed (left great toe today) at dermatologists-bleeding started approx 5pm after pt removed dsg-pt has gauze/ice/ and plastic bag around foot-no bleed thru at this time-advised to let staff know if bleed thru while waiting for tx area

## 2016-08-01 MED ORDER — CEPHALEXIN 500 MG PO CAPS: 500.0000 mg | ORAL_CAPSULE | Freq: Two times a day (BID) | ORAL | 0 refills | Status: DC

## 2016-08-01 NOTE — ED Notes (Addendum)
ED Provider at bedside discussing dispo plan of care.

## 2016-08-01 NOTE — ED Provider Notes (Signed)
[facility] Provider Note   CSN: [id number] Arrival date & time: 07/31/16  2128     History   Chief Complaint Chief Complaint  Patient presents with  . Nail Problem    HPI [name] is a 83 y.o. male.  The history is provided by the patient.  Toe Pain  This is a new problem. The current episode started 6 to 12 hours ago. The problem occurs constantly. The problem has not changed since onset.Pertinent negatives include no chest pain, no abdominal pain, no headaches and no shortness of breath. Nothing aggravates the symptoms. Nothing relieves the symptoms. He has tried nothing for the symptoms. The treatment provided no relief.  Also bleeding secondary to not telling the doctor who removed 40 % of his left great toenail that he was on aspirin and plavix.    Past Medical History:  Diagnosis Date  . Anginal pain ([place])   . Anxiety    " OCCASIONAL"  . Arthritis   . Asthma due to seasonal allergies    uses inhalers prn  . Carotid artery occlusion    Left  . Complication of anesthesia    " had tremors after prostate surgery  . Coronary artery disease   . Diverticulitis    recurrent  . Family history of anesthesia complication    MOTHER   . GERD (gastroesophageal reflux disease)   . H/O hiatal hernia   . Hearing aid worn   . Hyperlipemia   . Hypertension   . Kidney stone    lithotripsy 8341 w complications, req stents, Dr Rosana Hoes  . Prostate CA ([place])   . Shortness of breath   . TIA (transient ischemic attack)   . Tinnitus     Patient Active Problem List   Diagnosis Date Noted  . Angina pectoris ([place]) 10/09/2015  . Coronary artery disease   . Hypertension   . Hyperlipemia   . Carotid artery occlusion   . Cerumen impaction 08/30/2015  . Dizziness 08/09/2015  . Gait instability 05/30/2015  . Myalgia   . Weakness   . Bloating   . Chest pain at rest 03/24/2015  . Atypical chest pain 03/24/2015  . Fatigue 03/14/2015  . Hypothyroidism 10/26/2013  .  OSA (obstructive sleep apnea) 10/18/2013  . Well adult health check 01/02/2013  . Other symptoms involving urinary system(788.99) 12/21/2012  . Benign localized hyperplasia of prostate with urinary obstruction and other lower urinary tract symptoms (LUTS)(600.21) 12/21/2012  . Carcinoma in situ of prostate 12/21/2012  . Left shoulder pain 08/08/2012  . Obesity 07/24/2011  . Generalized anxiety disorder 07/24/2011  . DIVERTICULOSIS OF COLON 12/25/2009  . DERMATITIS, SEBORRHEIC 12/25/2009  . 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 Memory loss 08/14/2009  . BACK PAIN, LUMBAR 07/19/2009  . PROSTATE CANCER 09/09/2006  . HYPERLIPIDEMIA 09/09/2006  . HYPERTENSION, BENIGN SYSTEMIC 09/09/2006  . Coronary atherosclerosis 09/09/2006  . VENOUS INSUFFICIENCY, CHRONIC 09/09/2006  . GASTROESOPHAGEAL REFLUX, NO ESOPHAGITIS 09/09/2006  . HERNIA, HIATAL, NONCONGENITAL 09/09/2006  . INSOMNIA NOS 09/09/2006    Past Surgical History:  Procedure Laterality Date  . CARDIAC CATHETERIZATION  2008   minimal dz, Dr Cathie Olden  . CARDIAC CATHETERIZATION N/A 10/09/2015   Procedure: Left Heart Cath and Coronary Angiography;  Surgeon: Peter M Martinique, MD;  Location: [facility];  Service: Cardiovascular;  Laterality: N/A;  . CARDIAC CATHETERIZATION N/A 10/09/2015   Procedure: Intravascular Pressure Wire/FFR Study;  Surgeon: Peter M Martinique, MD;  Location: [facility];  Service: Cardiovascular;  Laterality: N/A;  . CARDIAC CATHETERIZATION N/A 10/09/2015   Procedure: Coronary Stent Intervention;  Surgeon: Peter M Martinique, MD;  Location: [facility];  Service: Cardiovascular;  Laterality: N/A;  . CAROTID ENDARTERECTOMY  Left   1998  . CHOLECYSTECTOMY    . CORONARY STENT PLACEMENT  10/09/2015   DES to RCA  . LEFT HEART CATHETERIZATION WITH CORONARY ANGIOGRAM N/A 09/15/2013   Procedure: LEFT HEART CATHETERIZATION WITH CORONARY ANGIOGRAM;  Surgeon: Peter M Martinique, MD;  Location: [facility] C[facility];  Service:  Cardiovascular;  Laterality: N/A;  . PROSTATECTOMY  1998   radical for prostate cancer       Home Medications    Prior to Admission medications   Medication Sig Start Date End Date Taking? Authorizing Provider  albuterol (PROVENTIL HFA) 108 (90 BASE) MCG/ACT inhaler Inhale 2 puffs into the lungs every 4 (four) hours as needed for wheezing. 11/13/13   Leeanne Rio, MD  Artificial Tear Solution (BION TEARS OP) Place 1 drop into both eyes daily as needed (dry eyes).    Historical Provider, MD  aspirin 81 MG tablet Take 1 tablet (81 mg total) by mouth daily. 10/10/15   Arbutus Leas, NP  carvedilol (COREG) 6.25 MG tablet Take 1 tablet (6.25 mg total) by mouth 2 (two) times daily with a meal. 10/10/15   Arbutus Leas, NP  clopidogrel (PLAVIX) 75 MG tablet Take 1 tablet (75 mg total) by mouth daily with breakfast. 10/10/15   Arbutus Leas, NP  doxycycline (VIBRA-TABS) 100 MG tablet Take 1 tablet (100 mg total) by mouth 2 (two) times daily. 05/31/16   Leslie N Rumley, DO  ezetimibe (ZETIA) 10 MG tablet Take 1 tablet (10 mg total) by mouth daily. 10/10/15   Arbutus Leas, NP  fluconazole (DIFLUCAN) 150 MG tablet Take 1 tablet (150 mg total) by mouth once a week. 07/22/16   Eliezer Bottom, NP  folic acid (FOLVITE) 1 MG tablet Take 1 mg by mouth daily.    Historical Provider, MD  HYDROcodone-acetaminophen (NORCO/VICODIN) 5-325 MG tablet Take 0.5-1 tablets by mouth 2 (two) times daily as needed (pain). Patient taking differently: Take 1 tablet by mouth 2 (two) times daily as needed (pain).  07/16/16   Gay Filler Copland, MD  ipratropium (ATROVENT) 0.03 % nasal spray Place 2 sprays into the nose 4 (four) times daily. 05/11/16   Gay Filler Copland, MD  levothyroxine (SYNTHROID, LEVOTHROID) 100 MCG tablet TAKE 1 TABLET EVERY DAY IN THE MORNING. 07/16/16   Gay Filler Copland, MD  liothyronine (CYTOMEL) 5 MCG tablet Take 5 mcg by mouth every morning. 02/17/16   Historical Provider, MD  losartan-hydrochlorothiazide  (HYZAAR) 100-12.5 MG tablet Take 1 tablet by mouth every other day.  01/24/16   Historical Provider, MD  nitroGLYCERIN (NITROLINGUAL) 0.4 MG/SPRAY spray Place 1 spray under the tongue every 5 (five) minutes x 3 doses as needed for chest pain. 03/18/16   Almyra Deforest, PA  nystatin (MYCOSTATIN/NYSTOP) powder Apply topically 3 (three) times daily. 07/22/16   Eliezer Bottom, NP  polyethylene glycol [facility] / GLYCOLAX) packet Take 17 g by mouth daily. Mix in 8 oz liquid and drink    Historical Provider, MD  triamcinolone cream (KENALOG) 0.1 % Apply 1 application topically daily as needed (rash).    Historical Provider, MD    Family History Family History  Problem Relation Age of Onset  . Heart disease Mother   . Heart disease Father   .  Kidney failure Father   . Heart disease Sister   . Heart attack Sister   . Dementia Sister   . Sjogren's syndrome Child   . Hashimoto's thyroiditis Child   . CAD Child     Social History Social History  Substance Use Topics  . Smoking status: Former Smoker    Packs/day: 2.50    Years: 30.00    Types: Cigarettes    Quit date: 08/28/1985  . Smokeless tobacco: Never Used  . Alcohol use 1.2 oz/week    2 Standard drinks or equivalent per week     Comment: 1 glass wine/week (prior 1 glass martini with dinner)     Allergies   Ciprofloxacin; Codeine; Flexeril [cyclobenzaprine]; Methocarbamol; Sulfa antibiotics; Sulfamethoxazole; Sulfonamide derivatives; and Flagyl [metronidazole]   Review of Systems Review of Systems  Constitutional: Negative for fever.  Respiratory: Negative for shortness of breath.   Cardiovascular: Negative for chest pain.  Gastrointestinal: Negative for abdominal pain.  Musculoskeletal: Positive for arthralgias. Negative for joint swelling and neck stiffness.  Neurological: Negative for headaches.  All other systems reviewed and are negative.    Physical Exam Updated Vital Signs BP 141/73 (BP Location: Right Arm)   Pulse (!)  55   Temp 97.9 F (36.6 C) (Oral)   Resp 16   SpO2 99%   Physical Exam  Constitutional: He is oriented to person, place, and time. He appears well-developed and well-nourished.  HENT:  Head: Normocephalic and atraumatic.  Mouth/Throat: No oropharyngeal exudate.  Eyes: EOM are normal. Pupils are equal, round, and reactive to light.  Neck: Normal range of motion. Neck supple.  Cardiovascular: Normal rate, regular rhythm and intact distal pulses.   Pulmonary/Chest: Effort normal and breath sounds normal. No respiratory distress.  Abdominal: Soft. Bowel sounds are normal. There is no tenderness.  Musculoskeletal: Normal range of motion.  Neurological: He is alert and oriented to person, place, and time.  Skin: Skin is warm. Capillary refill takes less than 2 seconds.  Slight ooze at great toenail site  Psychiatric: He has a normal mood and affect.     ED Treatments / Results   Vitals:   07/31/16 2140 08/01/16 0239  BP: 173/67 141/73  Pulse: 78 (!) 55  Resp: 16 16  Temp: 97.9 F (36.6 C)     Radiology No results found.  Procedures Procedures (including critical care time)  Medications Ordered in ED  Medications - No data to display   Thrombin pad with pressure dressing without further bleeding.  Call your doctor and tell them of this bleeding and follow up with same.  Tetanus UTD.  Return to the closest ED for further bleeding or signs of infection  Final Clinical Impressions(s) / ED Diagnoses  All questions answered to patient's satisfaction. Based on history and exam patient has been appropriately medically screened and emergency conditions excluded. Patient is stable for discharge at this time. Strict return precautions given for any further episodes, persistent fever, weakness or any concerns.    Veatrice Kells, MD 08/01/16 1316

## 2016-08-01 NOTE — ED Notes (Signed)
ED Provider at bedside. 

## 2016-08-03 ENCOUNTER — Telehealth: Payer: Self-pay | Admitting: Cardiology

## 2016-08-03 NOTE — Telephone Encounter (Signed)
New Message     Has stints put in last year, you put him on Plavix can he stop it now instead of waiting until next month

## 2016-08-03 NOTE — Telephone Encounter (Signed)
Returned call to patient no answer.Left message on personal voice mail I will send message to [place] for advice.

## 2016-08-04 NOTE — Telephone Encounter (Signed)
Returned call to patient no answer.LMTC. 

## 2016-08-04 NOTE — Telephone Encounter (Signed)
Yes I think that is OK  Florence Antonelli Martinique MD, [facility]

## 2016-08-06 NOTE — Telephone Encounter (Signed)
lmtcb

## 2016-08-06 NOTE — Telephone Encounter (Signed)
Follow up ° ° ° ° ° °Returning a call to the nurse °

## 2016-08-06 NOTE — Telephone Encounter (Signed)
Spoke to patient.information given may stop plavix now. verbalized understanding.   medication  List adjusted .

## 2016-08-06 NOTE — Telephone Encounter (Signed)
LEFT MESSAGE ON HOME ANSWER MACHINE  TO CALL BACK

## 2016-08-28 ENCOUNTER — Other Ambulatory Visit: Payer: Medicare Other

## 2016-08-28 ENCOUNTER — Ambulatory Visit: Payer: Medicare Other | Admitting: Hematology & Oncology

## 2016-09-08 ENCOUNTER — Other Ambulatory Visit: Payer: Self-pay | Admitting: *Deleted

## 2016-09-08 MED ORDER — EZETIMIBE 10 MG PO TABS: 10.0000 mg | ORAL_TABLET | Freq: Every day | ORAL | 3 refills | Status: DC

## 2016-09-08 NOTE — Telephone Encounter (Signed)
Patient left a msg on the refill vm stating that [facility] the request for a refill of zetia was denied. He would like to know if he still needs to take this medication. He can be reached at [phone]. Thanks, MI

## 2016-09-08 NOTE — Telephone Encounter (Signed)
Rx(s) sent to pharmacy electronically.  

## 2016-09-11 ENCOUNTER — Telehealth: Payer: Self-pay | Admitting: *Deleted

## 2016-09-11 NOTE — Telephone Encounter (Signed)
lmtcb

## 2016-09-11 NOTE — Telephone Encounter (Signed)
Patient left a msg on the refill vm requesting a call back at [phone]. It is in regards to the zetia. Thanks, MI

## 2016-09-12 ENCOUNTER — Other Ambulatory Visit: Payer: Self-pay | Admitting: Medical

## 2016-09-23 ENCOUNTER — Other Ambulatory Visit: Payer: Self-pay | Admitting: Emergency Medicine

## 2016-09-23 MED ORDER — LIOTHYRONINE SODIUM 5 MCG PO TABS: 5.0000 ug | ORAL_TABLET | Freq: Every morning | ORAL | 3 refills | Status: DC

## 2016-09-23 NOTE — Telephone Encounter (Signed)
Received [facility] from [facility] liothyronine (CYTOMEL) 5 MCG tablet. Last office visit 06/01/16 and last refill 02/17/16. Is it ok to send new rx. Please advise.

## 2016-09-30 ENCOUNTER — Other Ambulatory Visit: Payer: Self-pay | Admitting: Emergency Medicine

## 2016-09-30 MED ORDER — ALBUTEROL SULFATE HFA 108 (90 BASE) MCG/ACT IN AERS: 2.0000 | INHALATION_SPRAY | RESPIRATORY_TRACT | 0 refills | Status: DC | PRN

## 2016-09-30 NOTE — Telephone Encounter (Signed)
[id number] [place], [place] [phone] (Phone) [phone] (Fax)      Pharmacy calling checking on the status of albuterol (PROVENTIL HFA) 108 (90 BASE) MCG/ACT inhaler refill, please advise

## 2016-09-30 NOTE — Telephone Encounter (Signed)
Refill sent as requested by pt.

## 2016-10-13 ENCOUNTER — Encounter: Payer: Self-pay | Admitting: Vascular Surgery

## 2016-10-21 ENCOUNTER — Ambulatory Visit: Payer: Medicare Other | Admitting: Vascular Surgery

## 2016-10-21 ENCOUNTER — Encounter: Payer: Self-pay | Admitting: Family

## 2016-10-21 ENCOUNTER — Ambulatory Visit (INDEPENDENT_AMBULATORY_CARE_PROVIDER_SITE_OTHER): Payer: Medicare Other | Admitting: Family

## 2016-10-21 ENCOUNTER — Ambulatory Visit (HOSPITAL_COMMUNITY)
Admission: RE | Admit: 2016-10-21 | Discharge: 2016-10-21 | Disposition: A | Discharge status: Home / Self Care | Payer: Medicare Other | Source: Ambulatory Visit | Attending: Vascular Surgery | Admitting: Vascular Surgery

## 2016-10-21 VITALS — BP 146/69 | HR 50 | Temp 96.9°F | Resp 18 | Ht 66.0 in | Wt 215.0 lb

## 2016-10-21 DIAGNOSIS — Z9889 Other specified postprocedural states: Secondary | ICD-10-CM | POA: Diagnosis not present

## 2016-10-21 DIAGNOSIS — I6521 Occlusion and stenosis of right carotid artery: Secondary | ICD-10-CM | POA: Diagnosis not present

## 2016-10-21 DIAGNOSIS — I6523 Occlusion and stenosis of bilateral carotid arteries: Secondary | ICD-10-CM

## 2016-10-21 LAB — VAS US CAROTID
LCCADDIAS: 14 cm/s
LCCADSYS: 119 cm/s
LCCAPDIAS: 23 cm/s
LEFT VERTEBRAL DIAS: 18 cm/s
LICAPDIAS: -9 cm/s
Left CCA prox sys: 139 cm/s
Left ICA prox sys: -36 cm/s
RIGHT CCA MID DIAS: 13 cm/s
RIGHT ECA DIAS: -2 cm/s
RIGHT VERTEBRAL DIAS: 9 cm/s
Right CCA prox dias: 14 cm/s
Right CCA prox sys: 125 cm/s
Right cca dist sys: -84 cm/s

## 2016-10-21 NOTE — Patient Instructions (Signed)
Stroke Prevention Some medical conditions and behaviors are associated with an increased chance of having a stroke. You may prevent a stroke by making healthy choices and managing medical conditions. How can I reduce my risk of having a stroke?  Stay physically active. Get at least 30 minutes of activity on most or all days.  Do not smoke. It may also be helpful to avoid exposure to secondhand smoke.  Limit alcohol use. Moderate alcohol use is considered to be:  No more than 2 drinks per day for men.  No more than 1 drink per day for nonpregnant women.  Eat healthy foods. This involves:  Eating 5 or more servings of fruits and vegetables a day.  Making dietary changes that address high blood pressure (hypertension), high cholesterol, diabetes, or obesity.  Manage your cholesterol levels.  Making food choices that are high in fiber and low in saturated fat, trans fat, and cholesterol may control cholesterol levels.  Take any prescribed medicines to control cholesterol as directed by your health care provider.  Manage your diabetes.  Controlling your carbohydrate and sugar intake is recommended to manage diabetes.  Take any prescribed medicines to control diabetes as directed by your health care provider.  Control your hypertension.  Making food choices that are low in salt (sodium), saturated fat, trans fat, and cholesterol is recommended to manage hypertension.  Ask your health care provider if you need treatment to lower your blood pressure. Take any prescribed medicines to control hypertension as directed by your health care provider.  If you are 18-39 years of age, have your blood pressure checked every 3-5 years. If you are 40 years of age or older, have your blood pressure checked every year.  Maintain a healthy weight.  Reducing calorie intake and making food choices that are low in sodium, saturated fat, trans fat, and cholesterol are recommended to manage  weight.  Stop drug abuse.  Avoid taking birth control pills.  Talk to your health care provider about the risks of taking birth control pills if you are over 35 years old, smoke, get migraines, or have ever had a blood clot.  Get evaluated for sleep disorders (sleep apnea).  Talk to your health care provider about getting a sleep evaluation if you snore a lot or have excessive sleepiness.  Take medicines only as directed by your health care provider.  For some people, aspirin or blood thinners (anticoagulants) are helpful in reducing the risk of forming abnormal blood clots that can lead to stroke. If you have the irregular heart rhythm of atrial fibrillation, you should be on a blood thinner unless there is a good reason you cannot take them.  Understand all your medicine instructions.  Make sure that other conditions (such as anemia or atherosclerosis) are addressed. Get help right away if:  You have sudden weakness or numbness of the face, arm, or leg, especially on one side of the body.  Your face or eyelid droops to one side.  You have sudden confusion.  You have trouble speaking (aphasia) or understanding.  You have sudden trouble seeing in one or both eyes.  You have sudden trouble walking.  You have dizziness.  You have a loss of balance or coordination.  You have a sudden, severe headache with no known cause.  You have new chest pain or an irregular heartbeat. Any of these symptoms may represent a serious problem that is an emergency. Do not wait to see if the symptoms will go away.   Get medical help at once. Call your local emergency services (911 in [place] Do not drive yourself to the hospital. This information is not intended to replace advice given to you by your health care provider. Make sure you discuss any questions you have with your health care provider. Document Released: 08/06/2004 Document Revised: 12/05/2015 Document Reviewed: 12/30/2012 Elsevier  Interactive Patient Education  2017 Elsevier Inc.      Preventing Cerebrovascular Disease Arteries are blood vessels that carry blood that contains oxygen from the heart to all parts of the body. Cerebrovascular disease affects arteries that supply the brain. Any condition that blocks or disrupts blood flow to the brain can cause cerebrovascular disease. Brain cells that lose blood supply start to die within minutes (stroke). Stroke is the main danger of cerebrovascular disease. Atherosclerosis and high blood pressure are common causes of cerebrovascular disease. Atherosclerosis is narrowing and hardening of an artery that results when fat, cholesterol, calcium, or other substances (plaque) build up inside an artery. Plaque reduces blood flow through the artery. High blood pressure increases the risk of bleeding inside the brain. Making diet and lifestyle changes to prevent atherosclerosis and high blood pressure lowers your risk of cerebrovascular disease. What nutrition changes can be made?  Eat more fruits, vegetables, and whole grains.  Reduce how much saturated fat you eat. To do this, eat less red meat and fewer full-fat dairy products.  Eat healthy proteins instead of red meat. Healthy proteins include:  Fish. Eat fish that contains heart-healthy omega-3 fatty acids, twice a week. Examples include salmon, albacore tuna, mackerel, and herring.  Chicken.  Nuts.  Low-fat or nonfat yogurt.  Avoid processed meats, like bacon and lunchmeat.  Avoid foods that contain:  A lot of sugar, such as sweets and drinks with added sugar.  A lot of salt (sodium). Avoid adding extra salt to your food, as told by your health care provider.  Trans fats, such as margarine and baked goods. Trans fats may be listed as "partially hydrogenated oils" on food labels.  Check food labels to see how much sodium, sugar, and trans fats are in foods.  Use vegetable oils that contain low amounts of  saturated fat, such as olive oil or canola oil. What lifestyle changes can be made?  Drink alcohol in moderation. This means no more than 1 drink a day for nonpregnant women and 2 drinks a day for men. One drink equals 12 oz of beer, 5 oz of wine, or 1 oz of hard liquor.  If you are overweight, ask your health care provider to recommend a weight-loss plan for you. Losing 5-10 lb (2.2-4.5 kg) can reduce your risk of diabetes, atherosclerosis, and high blood pressure.  Exercise for 30?60 minutes on most days, or as much as told by your health care provider.  Do moderate-intensity exercise, such as brisk walking, bicycling, and water aerobics. Ask your health care provider which activities are safe for you.  Do not use any products that contain nicotine or tobacco, such as cigarettes and e-cigarettes. If you need help quitting, ask your health care provider. Why are these changes important? Making these changes lowers your risk of many diseases that can cause cerebrovascular disease and stroke. Stroke is a leading cause of death and disability. Making these changes also improves your overall health and quality of life. What can I do to lower my risk? The following factors make you more likely to develop cerebrovascular disease:  Being overweight.  Smoking.  Being physically   inactive.  Eating a high-fat diet.  Having certain health conditions, such as:  Diabetes.  High blood pressure.  Heart disease.  Atherosclerosis.  High cholesterol.  Sickle cell disease. Talk with your health care provider about your risk for cerebrovascular disease. Work with your health care provider to control diseases that you have that may contribute to cerebrovascular disease. Your health care provider may prescribe medicines to help prevent major causes of cerebrovascular disease. Where to find more information: Learn more about preventing cerebrovascular disease from:  National Heart, Lung, and  Blood Institute: www.nhlbi.nih.gov/health/health-topics/topics/stroke  Centers for Disease Control and Prevention: cdc.gov/stroke/about.htm Summary  Cerebrovascular disease can lead to a stroke.  Atherosclerosis and high blood pressure are major causes of cerebrovascular disease.  Making diet and lifestyle changes can reduce your risk of cerebrovascular disease.  Work with your health care provider to get your risk factors under control to reduce your risk of cerebrovascular disease. This information is not intended to replace advice given to you by your health care provider. Make sure you discuss any questions you have with your health care provider. Document Released: 07/14/2015 Document Revised: 01/17/2016 Document Reviewed: 07/14/2015 Elsevier Interactive Patient Education  2017 Elsevier Inc.  

## 2016-10-21 NOTE — Progress Notes (Signed)
Chief Complaint: Follow up Extracranial Carotid Artery Stenosis   History of Present Illness  [name] is a 83 y.o. male who is status post left carotid endarterectomy in 1998. He had two preoperative TIA's, both with amaurosis fugax in the left eye. Pt denies any known subsequent TIA or stroke.  Dr. Scot Dock evaluated pt in February, 2017 with a 60-79% right carotid stenosis. As this was asymptomatic, Dr. Scot Dock recommended follow up carotid duplex in 6 months. The patient was on aspirin and was also on a statin.   He has undergone PTCA in March 2017 and has done well from that standpoint.   He denies claudication symptoms with walking. His wife states he is dyspneic with climbing stairs. He does have a stationary bike but does not use it.  He also seems to have arthritic pain.   Pt Diabetic: no Pt smoker: former smoker, quit in 1987, started at age 20  Pt meds include: Statin : no, may have had arthralgias with statin ASA: yes Other anticoagulants/antiplatelets: no   Past Medical History:  Diagnosis Date  . Anginal pain ([place])   . Anxiety    " OCCASIONAL"  . Arthritis   . Asthma due to seasonal allergies    uses inhalers prn  . Carotid artery occlusion    Left  . Complication of anesthesia    " had tremors after prostate surgery  . Coronary artery disease   . Diverticulitis    recurrent  . Family history of anesthesia complication    MOTHER   . GERD (gastroesophageal reflux disease)   . H/O hiatal hernia   . Hearing aid worn   . Hyperlipemia   . Hypertension   . Kidney stone    lithotripsy 0017 w complications, req stents, Dr Rosana Hoes  . Prostate CA ([place])   . Shortness of breath   . TIA (transient ischemic attack)   . Tinnitus     Social History Social History  Substance Use Topics  . Smoking status: Former Smoker    Packs/day: 2.50    Years: 30.00    Types: Cigarettes    Quit date: 08/28/1985  . Smokeless tobacco: Never Used  . Alcohol use 1.2  oz/week    2 Standard drinks or equivalent per week     Comment: 1 glass wine/week (prior 1 glass martini with dinner)    Family History Family History  Problem Relation Age of Onset  . Heart disease Mother   . Heart disease Father   . Kidney failure Father   . Heart disease Sister   . Heart attack Sister   . Dementia Sister   . Sjogren's syndrome Child   . Hashimoto's thyroiditis Child   . CAD Child     Surgical History Past Surgical History:  Procedure Laterality Date  . CARDIAC CATHETERIZATION  2008   minimal dz, Dr Cathie Olden  . CARDIAC CATHETERIZATION N/A 10/09/2015   Procedure: Left Heart Cath and Coronary Angiography;  Surgeon: Peter M Martinique, MD;  Location: [facility];  Service: Cardiovascular;  Laterality: N/A;  . CARDIAC CATHETERIZATION N/A 10/09/2015   Procedure: Intravascular Pressure Wire/FFR Study;  Surgeon: Peter M Martinique, MD;  Location: [facility];  Service: Cardiovascular;  Laterality: N/A;  . CARDIAC CATHETERIZATION N/A 10/09/2015   Procedure: Coronary Stent Intervention;  Surgeon: Peter M Martinique, MD;  Location: [facility];  Service: Cardiovascular;  Laterality: N/A;  . CAROTID ENDARTERECTOMY  Left   1998  .  CHOLECYSTECTOMY    . CORONARY STENT PLACEMENT  10/09/2015   DES to RCA  . LEFT HEART CATHETERIZATION WITH CORONARY ANGIOGRAM N/A 09/15/2013   Procedure: LEFT HEART CATHETERIZATION WITH CORONARY ANGIOGRAM;  Surgeon: Peter M Martinique, MD;  Location: [facility];  Service: Cardiovascular;  Laterality: N/A;  . PROSTATECTOMY  1998   radical for prostate cancer    Allergies  Allergen Reactions  . Ciprofloxacin Other (See Comments)    Hallucinations or jittery Hallucinations or jittery  . Codeine Other (See Comments)    hallucinations hallucinations  . Flexeril [Cyclobenzaprine]   . Methocarbamol   . Sulfa Antibiotics Other (See Comments)    Chills and shaking "serum sickness" Chills and shaking "serum sickness"  . Sulfamethoxazole  Other (See Comments)    UNKNOWN REACTION, NOT DOCUMENTED  . Sulfonamide Derivatives Other (See Comments)    Chills and shaking "serum sickness"  . Flagyl [Metronidazole] Rash    Current Outpatient Prescriptions  Medication Sig Dispense Refill  . albuterol (PROVENTIL HFA) 108 (90 Base) MCG/ACT inhaler Inhale 2 puffs into the lungs every 4 (four) hours as needed for wheezing. 18 g 0  . Artificial Tear Solution (BION TEARS OP) Place 1 drop into both eyes daily as needed (dry eyes).    Marland Kitchen aspirin 81 MG tablet Take 1 tablet (81 mg total) by mouth daily. 30 tablet 12  . beclomethasone (QVAR) 40 MCG/ACT inhaler Inhale into the lungs.    . carvedilol (COREG) 6.25 MG tablet Take 1 tablet (6.25 mg total) by mouth 2 (two) times daily with a meal. 180 tablet 3  . ezetimibe (ZETIA) 10 MG tablet Take 1 tablet (10 mg total) by mouth daily. 90 tablet 3  . HYDROcodone-acetaminophen (NORCO/VICODIN) 5-325 MG tablet Take 0.5-1 tablets by mouth 2 (two) times daily as needed (pain). (Patient taking differently: Take 1 tablet by mouth 2 (two) times daily as needed (pain). ) 60 tablet 0  . ipratropium (ATROVENT) 0.03 % nasal spray Place 2 sprays into the nose 4 (four) times daily. 30 mL 6  . ketoconazole (NIZORAL) 2 % cream APPLY TO AFFECTED AREA TWICE A DAY  2  . levothyroxine (SYNTHROID, LEVOTHROID) 100 MCG tablet TAKE 1 TABLET EVERY DAY IN THE MORNING. 90 tablet 1  . liothyronine (CYTOMEL) 5 MCG tablet Take 1 tablet (5 mcg total) by mouth every morning. 90 tablet 3  . losartan-hydrochlorothiazide (HYZAAR) 100-12.5 MG tablet Take 1 tablet by mouth every other day.   3  . nitroGLYCERIN (NITROLINGUAL) 0.4 MG/SPRAY spray Place 1 spray under the tongue every 5 (five) minutes x 3 doses as needed for chest pain. 12 g 12  . polyethylene glycol (MIRALAX / GLYCOLAX) packet Take 17 g by mouth daily. Mix in 8 oz liquid and drink    . triamcinolone cream (KENALOG) 0.1 % Apply 1 application topically daily as needed (rash).     . cephALEXin (KEFLEX) 500 MG capsule Take 1 capsule (500 mg total) by mouth 2 (two) times daily. (Patient not taking: Reported on 10/21/2016) 14 capsule 0  . doxycycline (VIBRA-TABS) 100 MG tablet Take 1 tablet (100 mg total) by mouth 2 (two) times daily. (Patient not taking: Reported on 10/21/2016) 20 tablet 0  . fluconazole (DIFLUCAN) 150 MG tablet Take 1 tablet (150 mg total) by mouth once a week. (Patient not taking: Reported on 10/21/2016) 4 tablet 0  . folic acid (FOLVITE) 1 MG tablet Take 1 mg by mouth daily.    Marland Kitchen nystatin (MYCOSTATIN/NYSTOP) powder Apply topically 3 (  three) times daily. (Patient not taking: Reported on 10/21/2016) 1 Bottle 2   No current facility-administered medications for this visit.     Review of Systems : See HPI for pertinent positives and negatives.  Physical Examination  Vitals:   10/21/16 1555 10/21/16 1558 10/21/16 1602  BP: (!) 158/73 (!) 144/71 (!) 146/69  Pulse: (!) 53 (!) 50 (!) 50  Resp: 18    Temp: (!) 96.9 F (36.1 C)    SpO2: 98%    Weight: 215 lb (97.5 kg)    Height: 5\' 6"  (1.676 m)     Body mass index is 34.7 kg/m.  General: WDWN obese male in NAD GAIT: normal Eyes: PERRLA Pulmonary:  Respirations are non-labored, good air movement, CTAB  Cardiac: regular rhythm, no detected murmur.  VASCULAR EXAM Carotid Bruits Right Left   Negative Negative    Aorta is not palpable. Radial pulses are 2+ palpable and equal.                                                                                                                            LE Pulses Right Left       POPLITEAL  not palpable   not palpable       POSTERIOR TIBIAL   palpable    palpable        DORSALIS PEDIS      ANTERIOR TIBIAL not palpable  not     Gastrointestinal: soft, nontender, BS WNL, no r/g, no palpable masses.  Musculoskeletal: No muscle atrophy/wasting. M/S 5/5 throughout, extremities without ischemic changes.  Neurologic: A&O X 3; Appropriate  Affect, Speech is normal CN 2-12 intact except is mildly hard of hearing, pain and light touch intact in extremities, Motor exam as listed above.     Assessment: [name] is a 83 y.o. male who  is status post left carotid endarterectomy in 1998. He had two preoperative TIA's, both with amaurosis fugax in the left eye. Pt denies any known subsequent TIA or stroke.   His atherosclerotic risk factors include 35 year hx of smoking (quit in 1987), CAD, relatively sedentary lifestyle, and obesity.   He takes daily ASA.  We discussed ways that he could become more physically active and not be too painful to his knees or shoulders, e.g, stationary bike, senior water aerobics.   DATA Today's carotid duplex was a technically difficult exam due to body habitus and high bifurcations.  Right ICA: 40-59% stenosis (higher end of range) EDV is 50 today, unchanged from 50 on 04-01-16. Left ICA, CEA site, with <40% stenosis. No significant change compared to the last exam on 04-01-16.    Plan: Follow-up in 1 year with Carotid Duplex scan.   I discussed in depth with the patient the nature of atherosclerosis, and emphasized the importance of maximal medical management including strict control of blood pressure, blood glucose, and lipid levels, obtaining regular exercise, and continued cessation of smoking.  The patient  is aware that without maximal medical management the underlying atherosclerotic disease process will progress, limiting the benefit of any interventions. The patient was given information about stroke prevention and what symptoms should prompt the patient to seek immediate medical care. Thank you for allowing [place] to participate in this patient's care.  Clemon Chambers, RN, MSN, FNP-C [facility] Office: [phone]  Clinic Physician: Scot Dock  10/21/16 4:11 PM

## 2016-11-23 ENCOUNTER — Encounter: Payer: Self-pay | Admitting: Family Medicine

## 2016-11-23 ENCOUNTER — Ambulatory Visit (INDEPENDENT_AMBULATORY_CARE_PROVIDER_SITE_OTHER): Payer: Medicare Other | Admitting: Family Medicine

## 2016-11-23 ENCOUNTER — Ambulatory Visit (HOSPITAL_BASED_OUTPATIENT_CLINIC_OR_DEPARTMENT_OTHER)
Admission: RE | Admit: 2016-11-23 | Discharge: 2016-11-23 | Disposition: A | Discharge status: Home / Self Care | Payer: Medicare Other | Source: Ambulatory Visit | Attending: Family Medicine | Admitting: Family Medicine

## 2016-11-23 VITALS — BP 130/72 | HR 52 | Temp 98.7°F | Ht 66.0 in | Wt 215.6 lb

## 2016-11-23 DIAGNOSIS — Z5181 Encounter for therapeutic drug level monitoring: Secondary | ICD-10-CM

## 2016-11-23 DIAGNOSIS — G4733 Obstructive sleep apnea (adult) (pediatric): Secondary | ICD-10-CM | POA: Diagnosis not present

## 2016-11-23 DIAGNOSIS — M25562 Pain in left knee: Secondary | ICD-10-CM

## 2016-11-23 DIAGNOSIS — M25511 Pain in right shoulder: Secondary | ICD-10-CM

## 2016-11-23 DIAGNOSIS — D696 Thrombocytopenia, unspecified: Secondary | ICD-10-CM | POA: Diagnosis not present

## 2016-11-23 DIAGNOSIS — E034 Atrophy of thyroid (acquired): Secondary | ICD-10-CM

## 2016-11-23 DIAGNOSIS — G8929 Other chronic pain: Secondary | ICD-10-CM | POA: Diagnosis not present

## 2016-11-23 LAB — CBC WITH DIFFERENTIAL/PLATELET
Basophils Absolute: 0 10*3/uL (ref 0.0–0.1)
Basophils Relative: 0.7 % (ref 0.0–3.0)
EOS PCT: 7.7 % — AB (ref 0.0–5.0)
Eosinophils Absolute: 0.4 10*3/uL (ref 0.0–0.7)
HCT: 41.4 % (ref 39.0–52.0)
Hemoglobin: 14.5 g/dL (ref 13.0–17.0)
Lymphocytes Relative: 29.5 % (ref 12.0–46.0)
Lymphs Abs: 1.7 10*3/uL (ref 0.7–4.0)
MCHC: 35 g/dL (ref 30.0–36.0)
MCV: 101.1 fl — AB (ref 78.0–100.0)
MONO ABS: 0.8 10*3/uL (ref 0.1–1.0)
MONOS PCT: 13.8 % — AB (ref 3.0–12.0)
NEUTROS ABS: 2.8 10*3/uL (ref 1.4–7.7)
NEUTROS PCT: 48.3 % (ref 43.0–77.0)
PLATELETS: 136 10*3/uL — AB (ref 150.0–400.0)
RBC: 4.09 Mil/uL — ABNORMAL LOW (ref 4.22–5.81)
RDW: 14.1 % (ref 11.5–15.5)
WBC: 5.8 10*3/uL (ref 4.0–10.5)

## 2016-11-23 LAB — COMPREHENSIVE METABOLIC PANEL
ALK PHOS: 45 U/L (ref 39–117)
ALT: 25 U/L (ref 0–53)
AST: 21 U/L (ref 0–37)
Albumin: 4.3 g/dL (ref 3.5–5.2)
BUN: 24 mg/dL — AB (ref 6–23)
CO2: 30 meq/L (ref 19–32)
Calcium: 9.7 mg/dL (ref 8.4–10.5)
Chloride: 102 mEq/L (ref 96–112)
Creatinine, Ser: 1.86 mg/dL — ABNORMAL HIGH (ref 0.40–1.50)
GFR: 37.22 mL/min — AB (ref >60.00)
Glucose, Bld: 97 mg/dL (ref 70–99)
POTASSIUM: 3.9 meq/L (ref 3.5–5.1)
SODIUM: 138 meq/L (ref 135–145)
TOTAL PROTEIN: 7.2 g/dL (ref 6.0–8.3)
Total Bilirubin: 1.6 mg/dL — ABNORMAL HIGH (ref 0.2–1.2)

## 2016-11-23 LAB — T3, FREE: T3 FREE: 3.3 pg/mL (ref 2.3–4.2)

## 2016-11-23 LAB — TSH: TSH: 1.81 u[IU]/mL (ref 0.35–4.50)

## 2016-11-23 MED ORDER — HYDROCODONE-ACETAMINOPHEN 5-325 MG PO TABS: 0.5000 | ORAL_TABLET | Freq: Two times a day (BID) | ORAL | 0 refills | Status: DC | PRN

## 2016-11-23 NOTE — Patient Instructions (Signed)
Please go to lab and then to have your shoulder x-ray.  I will be in touch with your results and we will plan to next step for your shoulder.    We will need to do a urine drug screen and controlled substance contract today to comply with the new opoid laws

## 2016-11-23 NOTE — Progress Notes (Addendum)
[facility] [address] [place], [place] [address] [phone]  Date:  11/23/2016   Name:  [name]   DOB:  [birth year]   MRN:  [id number]  PCP:  Zachary Mclean, MD    Chief Complaint: Shoulder Pain (c/o sharp right shoulder pain that radiates down arm. Pt states that pain has been present for months. Pt would like thyroid testing today. ) and Medication Refill (Pt would like refill on hydrocodone-Acetaminophen. )   History of Present Illness:  [name] is a 83 y.o. very pleasant male patient who presents with the following:  He has noted right shoulder pain for 3-4 months. It got worse, and then started to get better. It doesn't hurt unless he moves it in a certain way.   No particular injury that he can recall. He had left shoulder pains in the past and was treated with cortisone- it got better and has not bothered him any longer. The right shoulder has not bothered him in the past  Lab Results  Component Value Date   TSH 2.13 05/11/2016   He needs to check his thyroid today- no check about 6 months ago He is on levothyroxine and cytomel as well.  In the past he has seen an endocrinologist but wonders if we are able to take care of his thyroid for him at this time He has also seen hematology for thrombocytopenia- he will see them soon for a follow-up visit  His last fill of hydrocodone was in January - he uses this as needed for his arthritis- we will refill for him today He takes this on occasion when he has joint pains.    Indication for chronic opioid: osteoarthritis pain Medication and dose: Vicodin 4  # pills per month: got 6 in January- none since  Last UDS date: never done- will get today Pain contract signed (Y/N): no Date narcotic database last reviewed (include red flags): yes- today.     Patient Active Problem List   Diagnosis Date Noted  . Angina pectoris ([place]) 10/09/2015  . Coronary  artery disease   . Hypertension   . Hyperlipemia   . Carotid artery occlusion   . Cerumen impaction 08/30/2015  . Dizziness 08/09/2015  . Gait instability 05/30/2015  . Myalgia   . Weakness   . Bloating   . Chest pain at rest 03/24/2015  . Atypical chest pain 03/24/2015  . Fatigue 03/14/2015  . Hypothyroidism 10/26/2013  . OSA (obstructive sleep apnea) 10/18/2013  . Well adult health check 01/02/2013  . Other symptoms involving urinary system(788.99) 12/21/2012  . Benign localized hyperplasia of prostate with urinary obstruction and other lower urinary tract symptoms (LUTS)(600.21) 12/21/2012  . Carcinoma in situ of prostate 12/21/2012  . Left shoulder pain 08/08/2012  . Obesity 07/24/2011  . Generalized anxiety disorder 07/24/2011  . DIVERTICULOSIS OF COLON 12/25/2009  . DERMATITIS, SEBORRHEIC 12/25/2009  . 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 Memory loss 08/14/2009  . BACK PAIN, LUMBAR 07/19/2009  . PROSTATE CANCER 09/09/2006  . HYPERLIPIDEMIA 09/09/2006  . HYPERTENSION, BENIGN SYSTEMIC 09/09/2006  . Coronary atherosclerosis 09/09/2006  . VENOUS INSUFFICIENCY, CHRONIC 09/09/2006  . GASTROESOPHAGEAL REFLUX, NO ESOPHAGITIS 09/09/2006  . HERNIA, HIATAL, NONCONGENITAL 09/09/2006  . INSOMNIA NOS 09/09/2006    Past Medical History:  Diagnosis Date  . Anginal pain ([place])   . Anxiety    " OCCASIONAL"  . Arthritis   . Asthma  due to seasonal allergies    uses inhalers prn  . Carotid artery occlusion    Left  . Complication of anesthesia    " had tremors after prostate surgery  . Coronary artery disease   . Diverticulitis    recurrent  . Family history of anesthesia complication    MOTHER   . GERD (gastroesophageal reflux disease)   . H/O hiatal hernia   . Hearing aid worn   . Hyperlipemia   . Hypertension   . Kidney stone    lithotripsy 7353 w complications, req stents, Dr Rosana Hoes  . Prostate CA ([place])   . Shortness of breath   . TIA (transient ischemic attack)    . Tinnitus     Past Surgical History:  Procedure Laterality Date  . CARDIAC CATHETERIZATION  2008   minimal dz, Dr Cathie Olden  . CARDIAC CATHETERIZATION N/A 10/09/2015   Procedure: Left Heart Cath and Coronary Angiography;  Surgeon: Peter M Martinique, MD;  Location: [facility];  Service: Cardiovascular;  Laterality: N/A;  . CARDIAC CATHETERIZATION N/A 10/09/2015   Procedure: Intravascular Pressure Wire/FFR Study;  Surgeon: Peter M Martinique, MD;  Location: [facility];  Service: Cardiovascular;  Laterality: N/A;  . CARDIAC CATHETERIZATION N/A 10/09/2015   Procedure: Coronary Stent Intervention;  Surgeon: Peter M Martinique, MD;  Location: [facility];  Service: Cardiovascular;  Laterality: N/A;  . CAROTID ENDARTERECTOMY  Left   1998  . CHOLECYSTECTOMY    . CORONARY STENT PLACEMENT  10/09/2015   DES to RCA  . LEFT HEART CATHETERIZATION WITH CORONARY ANGIOGRAM N/A 09/15/2013   Procedure: LEFT HEART CATHETERIZATION WITH CORONARY ANGIOGRAM;  Surgeon: Peter M Martinique, MD;  Location: [facility];  Service: Cardiovascular;  Laterality: N/A;  . PROSTATECTOMY  1998   radical for prostate cancer    Social History  Substance Use Topics  . Smoking status: Former Smoker    Packs/day: 2.50    Years: 30.00    Types: Cigarettes    Quit date: 08/28/1985  . Smokeless tobacco: Never Used  . Alcohol use 1.2 oz/week    2 Standard drinks or equivalent per week     Comment: 1 glass wine/week (prior 1 glass martini with dinner)    Family History  Problem Relation Age of Onset  . Heart disease Mother   . Heart disease Father   . Kidney failure Father   . Heart disease Sister   . Heart attack Sister   . Dementia Sister   . Sjogren's syndrome Child   . Hashimoto's thyroiditis Child   . CAD Child     Allergies  Allergen Reactions  . Ciprofloxacin Other (See Comments)    Hallucinations or jittery Hallucinations or jittery  . Codeine Other (See Comments)    hallucinations hallucinations   . Flexeril [Cyclobenzaprine]   . Methocarbamol   . Sulfa Antibiotics Other (See Comments)    Chills and shaking "serum sickness" Chills and shaking "serum sickness"  . Sulfamethoxazole Other (See Comments)    UNKNOWN REACTION, NOT DOCUMENTED  . Sulfonamide Derivatives Other (See Comments)    Chills and shaking "serum sickness"  . Flagyl [Metronidazole] Rash    Medication list has been reviewed and updated.  Current Outpatient Prescriptions on File Prior to Visit  Medication Sig Dispense Refill  . albuterol (PROVENTIL HFA) 108 (90 Base) MCG/ACT inhaler Inhale 2 puffs into the lungs every 4 (four) hours as needed for wheezing. 18 g 0  . Artificial Tear Solution (  BION TEARS OP) Place 1 drop into both eyes daily as needed (dry eyes).    Marland Kitchen aspirin 81 MG tablet Take 1 tablet (81 mg total) by mouth daily. 30 tablet 12  . beclomethasone (QVAR) 40 MCG/ACT inhaler Inhale into the lungs.    . carvedilol (COREG) 6.25 MG tablet Take 1 tablet (6.25 mg total) by mouth 2 (two) times daily with a meal. 180 tablet 3  . cephALEXin (KEFLEX) 500 MG capsule Take 1 capsule (500 mg total) by mouth 2 (two) times daily. (Patient not taking: Reported on 10/21/2016) 14 capsule 0  . doxycycline (VIBRA-TABS) 100 MG tablet Take 1 tablet (100 mg total) by mouth 2 (two) times daily. (Patient not taking: Reported on 10/21/2016) 20 tablet 0  . ezetimibe (ZETIA) 10 MG tablet Take 1 tablet (10 mg total) by mouth daily. 90 tablet 3  . fluconazole (DIFLUCAN) 150 MG tablet Take 1 tablet (150 mg total) by mouth once a week. (Patient not taking: Reported on 10/21/2016) 4 tablet 0  . folic acid (FOLVITE) 1 MG tablet Take 1 mg by mouth daily.    Marland Kitchen HYDROcodone-acetaminophen (NORCO/VICODIN) 5-325 MG tablet Take 0.5-1 tablets by mouth 2 (two) times daily as needed (pain). (Patient taking differently: Take 1 tablet by mouth 2 (two) times daily as needed (pain). ) 60 tablet 0  . ipratropium (ATROVENT) 0.03 % nasal spray Place 2 sprays  into the nose 4 (four) times daily. 30 mL 6  . ketoconazole (NIZORAL) 2 % cream APPLY TO AFFECTED AREA TWICE A DAY  2  . levothyroxine (SYNTHROID, LEVOTHROID) 100 MCG tablet TAKE 1 TABLET EVERY DAY IN THE MORNING. 90 tablet 1  . liothyronine (CYTOMEL) 5 MCG tablet Take 1 tablet (5 mcg total) by mouth every morning. 90 tablet 3  . losartan-hydrochlorothiazide (HYZAAR) 100-12.5 MG tablet Take 1 tablet by mouth every other day.   3  . nitroGLYCERIN (NITROLINGUAL) 0.4 MG/SPRAY spray Place 1 spray under the tongue every 5 (five) minutes x 3 doses as needed for chest pain. 12 g 12  . nystatin (MYCOSTATIN/NYSTOP) powder Apply topically 3 (three) times daily. (Patient not taking: Reported on 10/21/2016) 1 Bottle 2  . polyethylene glycol (MIRALAX / GLYCOLAX) packet Take 17 g by mouth daily. Mix in 8 oz liquid and drink    . triamcinolone cream (KENALOG) 0.1 % Apply 1 application topically daily as needed (rash).    . VENTOLIN HFA 108 (90 Base) MCG/ACT inhaler INHALE 2 PUFFS INTO THE LUNGS EVERY 6 HRS AS NEEDED FOR WHEEZING OR SHORTNESS OF BREATH 18 Inhaler 0   No current facility-administered medications on file prior to visit.     Review of Systems:  As per HPI- otherwise negative.   Physical Examination: Vitals:   11/23/16 1114  BP: 130/72  Pulse: (!) 52  Temp: 98.7 F (37.1 C)   Vitals:   11/23/16 1114  Weight: 215 lb 9.6 oz (97.8 kg)  Height: 5\' 6"  (1.676 m)   Body mass index is 34.8 kg/m. Ideal Body Weight: Weight in (lb) to have BMI = 25: 154.6  GEN: WDWN, NAD, Non-toxic, A & O x 3, overweight, otherwise looks well HEENT: Atraumatic, Normocephalic. Neck supple. No masses, No LAD. Ears and Nose: No external deformity. CV: RRR, No M/G/R. No JVD. No thrill. No extra heart sounds. PULM: CTA B, no wheezes, crackles, rhonchi. No retractions. No resp. distress. No accessory muscle use. ABD: S, NT, ND, +BS. No rebound. No HSM. EXTR: No c/c/e NEURO Normal gait.  PSYCH: Normally  interactive. Conversant. Not depressed or anxious appearing.  Calm demeanor.  No cervical spine pain, normal ROM of the cervical spine He has pain with flexion and abduction of the right shoulder past approx 120 degrees, and has very poor internal and external rotation.  No weakness of the shoulder or arm musculature.  No wasting of the deltoid, no heat or rash  Dg Shoulder Right  Result Date: 11/23/2016 CLINICAL DATA:  Right shoulder pain. EXAM: RIGHT SHOULDER - 2+ VIEW COMPARISON:  None. FINDINGS: There is no evidence of fracture or dislocation. Mild degenerative disease involving the glenohumeral joint and AC joint. No bone lesions or destruction identified. Soft tissues are unremarkable. IMPRESSION: Negative. Electronically Signed   By: Aletta Edouard M.D.   On: 11/23/2016 13:44     Assessment and Plan: Hypothyroidism due to acquired atrophy of thyroid - Plan: TSH, T3, free, T4  Medication monitoring encounter - Plan: Comprehensive metabolic panel  OSA (obstructive sleep apnea)  Acute pain of right shoulder - Plan: DG Shoulder Right  Thrombocytopenia ([place]) - Plan: CBC with Differential/Platelet  Chronic pain of left knee - Plan: HYDROcodone-acetaminophen (NORCO/VICODIN) 5-325 MG tablet  Here today with a few concerns He would like to check his thyroid labs- will do so for him today Set up [place] and UDS today as he is on semi- chronic pain medication for arthritic joints.  He is not very interested in surgery for his joints given his age  64 message regarding his shoulder films and offered PT.  He hd PT available at his assisted living facility and will discuss it with them. Will plan further follow- up pending labs.   Results for orders placed or performed in visit on 11/23/16  TSH  Result Value Ref Range   TSH 1.81 0.35 - 4.50 uIU/mL  Comprehensive metabolic panel  Result Value Ref Range   Sodium 138 135 - 145 mEq/L   Potassium 3.9 3.5 - 5.1 mEq/L   Chloride 102 96 - 112  mEq/L   CO2 30 19 - 32 mEq/L   Glucose, Bld 97 70 - 99 mg/dL   BUN 24 (H) 6 - 23 mg/dL   Creatinine, Ser 1.86 (H) 0.40 - 1.50 mg/dL   Total Bilirubin 1.6 (H) 0.2 - 1.2 mg/dL   Alkaline Phosphatase 45 39 - 117 U/L   AST 21 0 - 37 U/L   ALT 25 0 - 53 U/L   Total Protein 7.2 6.0 - 8.3 g/dL   Albumin 4.3 3.5 - 5.2 g/dL   Calcium 9.7 8.4 - 10.5 mg/dL   GFR 37.22 (L) >60.00 mL/min  CBC with Differential/Platelet  Result Value Ref Range   WBC 5.8 4.0 - 10.5 K/uL   RBC 4.09 (L) 4.22 - 5.81 Mil/uL   Hemoglobin 14.5 13.0 - 17.0 g/dL   HCT 41.4 39.0 - 52.0 %   MCV 101.1 (H) 78.0 - 100.0 fl   MCHC 35.0 30.0 - 36.0 g/dL   RDW 14.1 11.5 - 15.5 %   Platelets 136.0 (L) 150.0 - 400.0 K/uL   Neutrophils Relative % 48.3 43.0 - 77.0 %   Lymphocytes Relative 29.5 12.0 - 46.0 %   Monocytes Relative 13.8 (H) 3.0 - 12.0 %   Eosinophils Relative 7.7 (H) 0.0 - 5.0 %   Basophils Relative 0.7 0.0 - 3.0 %   Neutro Abs 2.8 1.4 - 7.7 K/uL   Lymphs Abs 1.7 0.7 - 4.0 K/uL   Monocytes Absolute 0.8 0.1 - 1.0 K/uL  Eosinophils Absolute 0.4 0.0 - 0.7 K/uL   Basophils Absolute 0.0 0.0 - 0.1 K/uL  T3, free  Result Value Ref Range   T3, Free 3.3 2.3 - 4.2 pg/mL  T4  Result Value Ref Range   T4, Total 8.4 4.5 - 12.0 ug/dL      Signed Lamar Blinks, MD

## 2016-11-24 ENCOUNTER — Encounter: Payer: Self-pay | Admitting: Family Medicine

## 2016-11-24 LAB — T4: T4 TOTAL: 8.4 ug/dL (ref 4.5–12.0)

## 2016-11-25 ENCOUNTER — Encounter: Payer: Self-pay | Admitting: Family Medicine

## 2016-11-27 ENCOUNTER — Other Ambulatory Visit: Payer: Self-pay | Admitting: *Deleted

## 2016-11-27 DIAGNOSIS — C61 Malignant neoplasm of prostate: Secondary | ICD-10-CM

## 2016-11-30 ENCOUNTER — Other Ambulatory Visit (HOSPITAL_BASED_OUTPATIENT_CLINIC_OR_DEPARTMENT_OTHER): Payer: Medicare Other

## 2016-11-30 ENCOUNTER — Ambulatory Visit (HOSPITAL_BASED_OUTPATIENT_CLINIC_OR_DEPARTMENT_OTHER): Payer: Medicare Other | Admitting: Hematology & Oncology

## 2016-11-30 VITALS — BP 152/72 | HR 61 | Temp 97.7°F | Resp 17 | Wt 218.0 lb

## 2016-11-30 DIAGNOSIS — R5383 Other fatigue: Secondary | ICD-10-CM | POA: Diagnosis not present

## 2016-11-30 DIAGNOSIS — K760 Fatty (change of) liver, not elsewhere classified: Secondary | ICD-10-CM | POA: Diagnosis not present

## 2016-11-30 DIAGNOSIS — E032 Hypothyroidism due to medicaments and other exogenous substances: Secondary | ICD-10-CM

## 2016-11-30 DIAGNOSIS — N183 Chronic kidney disease, stage 3 unspecified: Secondary | ICD-10-CM

## 2016-11-30 DIAGNOSIS — C61 Malignant neoplasm of prostate: Secondary | ICD-10-CM

## 2016-11-30 DIAGNOSIS — N261 Atrophy of kidney (terminal): Secondary | ICD-10-CM | POA: Diagnosis not present

## 2016-11-30 DIAGNOSIS — R21 Rash and other nonspecific skin eruption: Secondary | ICD-10-CM

## 2016-11-30 DIAGNOSIS — D631 Anemia in chronic kidney disease: Secondary | ICD-10-CM

## 2016-11-30 DIAGNOSIS — D469 Myelodysplastic syndrome, unspecified: Secondary | ICD-10-CM

## 2016-11-30 LAB — COMPREHENSIVE METABOLIC PANEL
ALBUMIN: 4 g/dL (ref 3.5–5.0)
ALT: 26 U/L (ref 0–55)
AST: 24 U/L (ref 5–34)
Alkaline Phosphatase: 45 U/L (ref 40–150)
Anion Gap: 9 mEq/L (ref 3–11)
BUN: 20.4 mg/dL (ref 7.0–26.0)
CHLORIDE: 105 meq/L (ref 98–109)
CO2: 26 mEq/L (ref 22–29)
CREATININE: 1.6 mg/dL — AB (ref 0.7–1.3)
Calcium: 9.4 mg/dL (ref 8.4–10.4)
EGFR: 39 mL/min/{1.73_m2} — ABNORMAL LOW (ref >90)
GLUCOSE: 100 mg/dL (ref 70–140)
POTASSIUM: 4.5 meq/L (ref 3.5–5.1)
SODIUM: 140 meq/L (ref 136–145)
Total Bilirubin: 2.09 mg/dL — ABNORMAL HIGH (ref 0.20–1.20)
Total Protein: 6.9 g/dL (ref 6.4–8.3)

## 2016-11-30 LAB — CBC WITH DIFFERENTIAL (CANCER CENTER ONLY)
BASO#: 0.2 10*3/uL (ref 0.0–0.2)
BASO%: 2.8 % — AB (ref 0.0–2.0)
EOS%: 11.6 % — AB (ref 0.0–7.0)
Eosinophils Absolute: 0.7 10*3/uL — ABNORMAL HIGH (ref 0.0–0.5)
HCT: 39.6 % (ref 38.7–49.9)
HEMOGLOBIN: 14.2 g/dL (ref 13.0–17.1)
LYMPH#: 1.3 10*3/uL (ref 0.9–3.3)
LYMPH%: 23.2 % (ref 14.0–48.0)
MCH: 35.6 pg — ABNORMAL HIGH (ref 28.0–33.4)
MCHC: 35.9 g/dL (ref 32.0–35.9)
MCV: 99 fL — ABNORMAL HIGH (ref 82–98)
MONO#: 0.8 10*3/uL (ref 0.1–0.9)
MONO%: 14.6 % — AB (ref 0.0–13.0)
NEUT%: 47.8 % (ref 40.0–80.0)
NEUTROS ABS: 2.7 10*3/uL (ref 1.5–6.5)
Platelets: 120 10*3/uL — ABNORMAL LOW (ref 145–400)
RBC: 3.99 10*6/uL — ABNORMAL LOW (ref 4.20–5.70)
RDW: 13.1 % (ref 11.1–15.7)
WBC: 5.7 10*3/uL (ref 4.0–10.0)

## 2016-11-30 LAB — TECHNOLOGIST REVIEW CHCC SATELLITE

## 2016-11-30 NOTE — Progress Notes (Signed)
Hematology and Oncology Follow Up Visit  ITZEL King [id number] 1933/08/18 83 y.o. 11/30/2016   Principle Diagnosis  Thrombocytopenia - likely myelodysplasia  Current Therapy:   Folic acid 1 mg PO daily    Interim History:  [name] is here today for follow-up. He is still feeling fatigued and states that he "sleeps all the time." He tried taking folic acid but states that it causes him to have aches and pains "all over" so he has been taking on and off.  He has bloating around his abdomen and some tenderness on palpation of the left and right upper quadrants. [place] today showed hepatic steatosis and mildly atrophic right kidney. I have forwarded these results to his PCP.  He still has a rash that itches and burns on both legs and feet. He has tried two antibiotics without relief. With the itching and burning we will try treating him for athletes foot and see if he has any improvement.  No numbness or tingling in his extremities. He has generalized arthritic pain that comes and goes and is exacerbated by the weather.  No fever, chills, n/v, cough, dizziness, headache, SOB, palpitations or changes in bowel or bladder habits.  He has occasional chest pain that so far has resolved with nitro. He is followed by cardiology and knows that if his chest pain does not improve to call EMS immediately.  He has maintained a good appetite but admits that he needs to drink more fluids. His weight is stable.  He plans to try going to the gym more frequently to help build up his stamina and energy.   Medications:  Allergies as of 11/30/2016      Reactions   Ciprofloxacin Other (See Comments)   Hallucinations or jittery Hallucinations or jittery   Codeine Other (See Comments)   hallucinations hallucinations   Flexeril [cyclobenzaprine]    Methocarbamol    Sulfa Antibiotics Other (See Comments)   Chills and shaking "serum sickness" Chills and shaking "serum sickness"   Sulfamethoxazole Other (See  Comments)   UNKNOWN REACTION, NOT DOCUMENTED   Sulfonamide Derivatives Other (See Comments)   Chills and shaking "serum sickness"   Flagyl [metronidazole] Rash      Medication List       Accurate as of 11/30/16  6:25 PM. Always use your most recent med list.          aspirin 81 MG tablet Take 1 tablet (81 mg total) by mouth daily.   BION TEARS OP Place 1 drop into both eyes daily as needed (dry eyes).   carvedilol 6.25 MG tablet Commonly known as:  COREG Take 1 tablet (6.25 mg total) by mouth 2 (two) times daily with a meal.   ezetimibe 10 MG tablet Commonly known as:  ZETIA Take 1 tablet (10 mg total) by mouth daily.   HYDROcodone-acetaminophen 5-325 MG tablet Commonly known as:  NORCO/VICODIN Take 0.5-1 tablets by mouth 2 (two) times daily as needed (pain).   ipratropium 0.03 % nasal spray Commonly known as:  ATROVENT Place 2 sprays into the nose 4 (four) times daily.   ketoconazole 2 % cream Commonly known as:  NIZORAL APPLY TO AFFECTED AREA TWICE A DAY   levothyroxine 100 MCG tablet Commonly known as:  SYNTHROID, LEVOTHROID TAKE 1 TABLET EVERY DAY IN THE MORNING.   liothyronine 5 MCG tablet Commonly known as:  CYTOMEL Take 1 tablet (5 mcg total) by mouth every morning.   losartan-hydrochlorothiazide 100-12.5 MG tablet Commonly known as:  HYZAAR Take 1 tablet by mouth every other day.   nitroGLYCERIN 0.4 MG/SPRAY spray Commonly known as:  NITROLINGUAL Place 1 spray under the tongue every 5 (five) minutes x 3 doses as needed for chest pain.   polyethylene glycol packet Commonly known as:  MIRALAX / GLYCOLAX Take 17 g by mouth daily. Mix in 8 oz liquid and drink   QVAR 40 MCG/ACT inhaler Generic drug:  beclomethasone Inhale into the lungs.   triamcinolone cream 0.1 % Commonly known as:  KENALOG Apply 1 application topically daily as needed (rash).   VENTOLIN HFA 108 (90 Base) MCG/ACT inhaler Generic drug:  albuterol INHALE 2 PUFFS INTO THE LUNGS  EVERY 6 HRS AS NEEDED FOR WHEEZING OR SHORTNESS OF BREATH       Allergies:  Allergies  Allergen Reactions  . Ciprofloxacin Other (See Comments)    Hallucinations or jittery Hallucinations or jittery  . Codeine Other (See Comments)    hallucinations hallucinations  . Flexeril [Cyclobenzaprine]   . Methocarbamol   . Sulfa Antibiotics Other (See Comments)    Chills and shaking "serum sickness" Chills and shaking "serum sickness"  . Sulfamethoxazole Other (See Comments)    UNKNOWN REACTION, NOT DOCUMENTED  . Sulfonamide Derivatives Other (See Comments)    Chills and shaking "serum sickness"  . Flagyl [Metronidazole] Rash    Past Medical History, Surgical history, Social history, and Family History were reviewed and updated.  Review of Systems: All other 10 point review of systems is negative.   Physical Exam:  weight is 218 lb (98.9 kg). His oral temperature is 97.7 F (36.5 C). His blood pressure is 152/72 (abnormal) and his pulse is 61. His respiration is 17 and oxygen saturation is 96%.   Wt Readings from Last 3 Encounters:  11/30/16 218 lb (98.9 kg)  11/23/16 215 lb 9.6 oz (97.8 kg)  10/21/16 215 lb (97.5 kg)    Ocular: Sclerae unicteric, pupils equal, round and reactive to light Ear-nose-throat: Oropharynx clear, dentition fair Lymphatic: No cervical supraclavicular or axillary adenopathy Lungs no rales or rhonchi, good excursion bilaterally Heart regular rate and rhythm, no murmur appreciated Abd soft, nontender, positive bowel sounds, no liver or spleen tip palpated on exam, no fluid wave MSK no focal spinal tenderness, no joint edema Neuro: non-focal, well-oriented, appropriate affect Breasts: Deferred  Lab Results  Component Value Date   WBC 5.7 11/30/2016   HGB 14.2 11/30/2016   HCT 39.6 11/30/2016   MCV 99 (H) 11/30/2016   PLT 120 (L) 11/30/2016   Lab Results  Component Value Date   FERRITIN 185 07/22/2016   IRON 106 07/22/2016   TIBC 281  07/22/2016   UIBC 175 07/22/2016   IRONPCTSAT 38 07/22/2016   Lab Results  Component Value Date   RETICCTPCT 2.6 03/24/2015   RBC 3.99 (L) 11/30/2016   No results found for: Nils Pyle [facility] Lab Results  Component Value Date   IGGSERUM 1,180 06/17/2015   IGA 385 (H) 06/17/2015   IGMSERUM 68 06/17/2015   Lab Results  Component Value Date   TOTALPROTELP 7.0 06/17/2015   ALBUMINELP 4.1 06/17/2015   A1GS 0.2 06/17/2015   A2GS 0.6 06/17/2015   BETS 0.4 06/17/2015   BETA2SER 0.4 06/17/2015   GAMS 1.2 06/17/2015   SPEI SEE NOTE 06/17/2015     Chemistry      Component Value Date/Time   NA 140 11/30/2016 1142   K 4.5 11/30/2016 1142   CL 102 11/23/2016 1154   CO2 26 11/30/2016  1142   BUN 20.4 11/30/2016 1142   CREATININE 1.6 (H) 11/30/2016 1142      Component Value Date/Time   CALCIUM 9.4 11/30/2016 1142   ALKPHOS 45 11/30/2016 1142   AST 24 11/30/2016 1142   ALT 26 11/30/2016 1142   BILITOT 2.09 (H) 11/30/2016 1142     Impression and Plan: Mr. Salek is a very pleasant 83 yo white male with history of an elevated serum B 12 level symptomatic with fatigue. His B 12 remains elevated at >2000. He was unable to tolerate folic acid.   His testosterone and iron studies were within normal limits.   We did send off a JAK2 assay on him with his last visit, this was negative.   I do not see anything under the microscope on his blood smear that looked suspicious.   I'm sure that he has myelodysplasia. However, I just don't think that we have to get too aggressive with his workup.  I would like to see him back in about 4 months.  Volanda Napoleon, MD 5/21/20186:25 PM

## 2016-12-01 LAB — IRON AND TIBC
%SAT: 47 % (ref 20–55)
Iron: 128 ug/dL (ref 42–163)
TIBC: 274 ug/dL (ref 202–409)
UIBC: 145 ug/dL (ref 117–376)

## 2016-12-01 LAB — VITAMIN B12: Vitamin B12: >2000 pg/mL — ABNORMAL HIGH (ref 232–1245)

## 2016-12-01 LAB — TESTOSTERONE: TESTOSTERONE: 461 ng/dL (ref 264–916)

## 2016-12-01 LAB — FERRITIN: FERRITIN: 160 ng/mL (ref 22–316)

## 2016-12-24 ENCOUNTER — Encounter: Payer: Self-pay | Admitting: Family Medicine

## 2016-12-24 DIAGNOSIS — M25511 Pain in right shoulder: Secondary | ICD-10-CM

## 2016-12-27 ENCOUNTER — Other Ambulatory Visit: Payer: Self-pay | Admitting: Family Medicine

## 2016-12-27 DIAGNOSIS — E039 Hypothyroidism, unspecified: Secondary | ICD-10-CM

## 2017-01-06 ENCOUNTER — Ambulatory Visit: Payer: Medicare Other | Attending: Family Medicine | Admitting: Physical Therapy

## 2017-01-06 ENCOUNTER — Encounter: Payer: Self-pay | Admitting: Physical Therapy

## 2017-01-06 DIAGNOSIS — M25511 Pain in right shoulder: Secondary | ICD-10-CM | POA: Diagnosis not present

## 2017-01-06 DIAGNOSIS — M25611 Stiffness of right shoulder, not elsewhere classified: Secondary | ICD-10-CM | POA: Diagnosis present

## 2017-01-06 NOTE — Therapy (Signed)
[address] [place], [place], [address] Phone: [phone]   Fax:  [phone]  Physical Therapy Evaluation  Patient Details  Name: [name] MRN: [id number] Date of Birth: 10-18-1933 Referring Provider: Lamar Blinks  Encounter Date: 01/06/2017      PT End of Session - 01/06/17 1152    Visit Number 1   Date for PT Re-Evaluation 03/08/17   PT Start Time 1010   PT Stop Time 1110   PT Time Calculation (min) 60 min   Activity Tolerance Patient limited by pain   Behavior During Therapy [facility] for tasks assessed/performed      Past Medical History:  Diagnosis Date  . Anginal pain ([place])   . Anxiety    " OCCASIONAL"  . Arthritis   . Asthma due to seasonal allergies    uses inhalers prn  . Carotid artery occlusion    Left  . Complication of anesthesia    " had tremors after prostate surgery  . Coronary artery disease   . Diverticulitis    recurrent  . Family history of anesthesia complication    MOTHER   . GERD (gastroesophageal reflux disease)   . H/O hiatal hernia   . Hearing aid worn   . Hyperlipemia   . Hypertension   . Kidney stone    lithotripsy 4174 w complications, req stents, Dr Rosana Hoes  . Prostate CA ([place])   . Shortness of breath   . TIA (transient ischemic attack)   . Tinnitus     Past Surgical History:  Procedure Laterality Date  . CARDIAC CATHETERIZATION  2008   minimal dz, Dr Cathie Olden  . CARDIAC CATHETERIZATION N/A 10/09/2015   Procedure: Left Heart Cath and Coronary Angiography;  Surgeon: Peter M Martinique, MD;  Location: [place] CV LAB;  Service: Cardiovascular;  Laterality: N/A;  . CARDIAC CATHETERIZATION N/A 10/09/2015   Procedure: Intravascular Pressure Wire/FFR Study;  Surgeon: Peter M Martinique, MD;  Location: [place] CV LAB;  Service: Cardiovascular;  Laterality: N/A;  . CARDIAC CATHETERIZATION N/A 10/09/2015   Procedure: Coronary Stent Intervention;  Surgeon: Peter M  Martinique, MD;  Location: [place] CV LAB;  Service: Cardiovascular;  Laterality: N/A;  . CAROTID ENDARTERECTOMY  Left   1998  . CHOLECYSTECTOMY    . CORONARY STENT PLACEMENT  10/09/2015   DES to RCA  . LEFT HEART CATHETERIZATION WITH CORONARY ANGIOGRAM N/A 09/15/2013   Procedure: LEFT HEART CATHETERIZATION WITH CORONARY ANGIOGRAM;  Surgeon: Peter M Martinique, MD;  Location[facility] C[facility] LAB;  Service: Cardiovascular;  Laterality: N/A;  . PROSTATECTOMY  1998   radical for prostate cancer    There were no vitals filed for this visit.       Subjective Assessment - 01/06/17 1020    Subjective Pt reports R shoulder pain for last 6 months. Refered here by neighbor. Xray was good. Reports no neck pain. States no cause of the shoulder pain that he can recall, just started.    Patient Stated Goals Putting on shirt, reduce pain in shoulder, climb stairs (has pain with L knee and uses right shoulder/arm to balance which causes pain).    Currently in Pain? Yes   Pain Score 0-No pain   Pain Location Shoulder   Pain Orientation Right   Pain Descriptors / Indicators Aching;Sharp   Pain Type Acute pain   Pain Radiating Towards Radiates to outside of the arm.    Pain Onset More than a  month ago   Pain Frequency Intermittent   Aggravating Factors  Using and reaching 8/10 at worst. Pain will resolve in 30 sec. to 1 min. then has some soreness.    Pain Relieving Factors hot water in the shower   Effect of Pain on Daily Activities putting on a shirt, stairs,             [facility] PT Assessment - 01/06/17 0001      Assessment   Medical Diagnosis right shoulder pain   Referring Provider copland, Janett Billow   Onset Date/Surgical Date 12/06/16   Hand Dominance Right   Prior Therapy no     Precautions   Precautions None     Balance Screen   Has the patient fallen in the past 6 months No   Has the patient had a decrease in activity level because of a fear of falling?  No   Is the patient reluctant to leave  their home because of a fear of falling?  No     Home Environment   Additional Comments [place] 1/2 a flight up, but he doesn't have to go up and down often, laundry room is upstairs, doesn't do housework has rumba.      Prior Function   Level of Independence Independent   Vocation Retired   Leisure no real hobbies, spends about 15 hours on the computer     Posture/Postural Control   Posture Comments rounded shoulders, forward head     ROM / Strength   AROM / PROM / Strength AROM;PROM;Strength     AROM   AROM Assessment Site Shoulder   Right/Left Shoulder Right   Right Shoulder Flexion 105 Degrees   Right Shoulder ABduction 75 Degrees  very painful   Right Shoulder Internal Rotation 15 Degrees   Right Shoulder External Rotation 20 Degrees     PROM   PROM Assessment Site Shoulder   Right/Left Shoulder Right   Right Shoulder Flexion 110 Degrees   Right Shoulder ABduction 70 Degrees  very painful   Right Shoulder Internal Rotation 17 Degrees   Right Shoulder External Rotation 42 Degrees     Strength   Overall Strength Comments ER/IR at neutral was 4+/5 without pain, could not test flexion and abduction due to pain     Palpation   Palpation comment non tender            Objective measurements completed on examination: See above findings.                  PT Education - 01/06/17 1116    Education provided Yes   Education Details Pt asked about OTC pain medications, advised to check with his MD to see if they have a prefered version for him to use. That he can take them a little before doing HEP if it is causing him pain. He asked if it was okay to use a heat pack, advised that this would be fine., but that ice may feel good too after going exercises. Encouraged taking time every 30 min. to move shoulder around and do scapular retractions when using his computer.    Person(s) Educated Spouse;Patient   Methods Explanation;Demonstration;Handout    Comprehension Verbalized understanding          PT Short Term Goals - 01/06/17 1122      PT SHORT TERM GOAL #1   Title Demonstrate proper execution of HEP.    Time 1   Period Weeks   Status  New           PT Long Term Goals - 01/06/17 1124      PT LONG TERM GOAL #1   Title Decrease pain with activity to 5/10 at worst.    Baseline Pt reports pain of 8/10 at worst.   Time 8   Period Weeks   Status New     PT LONG TERM GOAL #2   Title Increase AROM of flexion 125 degrees.    Time 8   Period Weeks   Status New     PT LONG TERM GOAL #3   Title Pt. will be able to put on his shirt with 50% less difficulty.    Time 8   Period Weeks   Status New     PT LONG TERM GOAL #4   Title Pt will be able to reach opposite shoulder for bathing and personal hygiene.   Time 8   Period Weeks   Status New                Plan - 01/06/17 1153    Clinical Impression Statement Pt is very limited by pain. Appears to have some impingement symptoms. Needs work to improve ROM and keep shoulder loose. Develops stiffness with persistant use of computer during most of the day. Pt also had episode of knees buckling under him when he was standing to leave.    Clinical Presentation Evolving   Clinical Decision Making High   Rehab Potential Good   PT Frequency 2x / week   PT Duration 8 weeks   PT Treatment/Interventions ADLs/Self Care Home Management;Electrical Stimulation;Cryotherapy;Iontophoresis 4mg /ml Dexamethasone;Moist Heat;Ultrasound;Patient/family education;Therapeutic exercise;Therapeutic activities;Manual techniques   PT Next Visit Plan Review HEP, work on ROM and stretching, scapular stabilization.    Consulted and Agree with Plan of Care Patient;Family member/caregiver   Family Member Consulted wife      Patient will benefit from skilled therapeutic intervention in order to improve the following deficits and impairments:  Decreased strength, Postural dysfunction, Improper  body mechanics, Impaired flexibility, Decreased range of motion, Impaired UE functional use, Pain  Visit Diagnosis: Acute pain of right shoulder - Plan: PT plan of care cert/re-cert  Stiffness of right shoulder, not elsewhere classified - Plan: PT plan of care cert/re-cert      G-Codes - 91/66/06 1204    Functional Assessment Tool Used (Outpatient Only) foto 64% limitation   Functional Limitation Other PT primary   Other PT Primary Current Status (Y0459) At least 60 percent but less than 80 percent impaired, limited or restricted   Other PT Primary Goal Status (X7741) At least 40 percent but less than 60 percent impaired, limited or restricted       Problem List Patient Active Problem List   Diagnosis Date Noted  . Angina pectoris ([place]) 10/09/2015  . Coronary artery disease   . Hypertension   . Hyperlipemia   . Carotid artery occlusion   . Cerumen impaction 08/30/2015  . Dizziness 08/09/2015  . Gait instability 05/30/2015  . Myalgia   . Weakness   . Bloating   . Chest pain at rest 03/24/2015  . Atypical chest pain 03/24/2015  . Fatigue 03/14/2015  . Hypothyroidism 10/26/2013  . OSA (obstructive sleep apnea) 10/18/2013  . Well adult health check 01/02/2013  . Other symptoms involving urinary system(788.99) 12/21/2012  . Benign localized hyperplasia of prostate with urinary obstruction and other lower urinary tract symptoms (LUTS)(600.21) 12/21/2012  . Carcinoma in situ of prostate 12/21/2012  .  Left shoulder pain 08/08/2012  . Obesity 07/24/2011  . Generalized anxiety disorder 07/24/2011  . DIVERTICULOSIS OF COLON 12/25/2009  . DERMATITIS, SEBORRHEIC 12/25/2009  . 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 Memory loss 08/14/2009  . BACK PAIN, LUMBAR 07/19/2009  . PROSTATE CANCER 09/09/2006  . HYPERLIPIDEMIA 09/09/2006  . HYPERTENSION, BENIGN SYSTEMIC 09/09/2006  . Coronary atherosclerosis 09/09/2006  . VENOUS INSUFFICIENCY, CHRONIC 09/09/2006  . GASTROESOPHAGEAL  REFLUX, NO ESOPHAGITIS 09/09/2006  . HERNIA, HIATAL, NONCONGENITAL 09/09/2006  . INSOMNIA NOS 09/09/2006    Sumner Boast., PT 01/06/2017, 12:10 PM  [place] [address] [place], [place], [address] Phone: [phone]   Fax:  [phone]  Name: CLENTON ESPER MRN: [id number] Date of Birth: 08-09-1933

## 2017-01-15 ENCOUNTER — Ambulatory Visit: Payer: Medicare Other | Attending: Family Medicine | Admitting: Physical Therapy

## 2017-01-15 ENCOUNTER — Encounter: Payer: Self-pay | Admitting: Physical Therapy

## 2017-01-15 DIAGNOSIS — M25511 Pain in right shoulder: Secondary | ICD-10-CM | POA: Diagnosis not present

## 2017-01-15 DIAGNOSIS — M25611 Stiffness of right shoulder, not elsewhere classified: Secondary | ICD-10-CM | POA: Insufficient documentation

## 2017-01-15 NOTE — Therapy (Signed)
[place], [place], [address] Phone: [phone]   Fax:  [phone]  Physical Therapy Treatment  Patient Details  Name: [name] MRN: [id number] Date of Birth: [birth year] Referring Provider: Lamar Blinks  Encounter Date: 01/15/2017      PT End of Session - 01/15/17 0912    Visit Number 2   Date for PT Re-Evaluation 03/08/17   PT Start Time 0840   PT Stop Time 0935   PT Time Calculation (min) 55 min      Past Medical History:  Diagnosis Date  . Anginal pain ([place])   . Anxiety    " OCCASIONAL"  . Arthritis   . Asthma due to seasonal allergies    uses inhalers prn  . Carotid artery occlusion    Left  . Complication of anesthesia    " had tremors after prostate surgery  . Coronary artery disease   . Diverticulitis    recurrent  . Family history of anesthesia complication    MOTHER   . GERD (gastroesophageal reflux disease)   . H/O hiatal hernia   . Hearing aid worn   . Hyperlipemia   . Hypertension   . Kidney stone    lithotripsy 2992 w complications, req stents, Dr Rosana Hoes  . Prostate CA ([place])   . Shortness of breath   . TIA (transient ischemic attack)   . Tinnitus     Past Surgical History:  Procedure Laterality Date  . CARDIAC CATHETERIZATION  2008   minimal dz, Dr Cathie Olden  . CARDIAC CATHETERIZATION N/A 10/09/2015   Procedure: Left Heart Cath and Coronary Angiography;  Surgeon: Peter M Martinique, MD;  Location: [place] CV LAB;  Service: Cardiovascular;  Laterality: N/A;  . CARDIAC CATHETERIZATION N/A 10/09/2015   Procedure: Intravascular Pressure Wire/FFR Study;  Surgeon: Peter M Martinique, MD;  Location: [place] CV LAB;  Service: Cardiovascular;  Laterality: N/A;  . CARDIAC CATHETERIZATION N/A 10/09/2015   Procedure: Coronary Stent Intervention;  Surgeon: Peter M Martinique, MD;  Location: [place] CV LAB;  Service: Cardiovascular;  Laterality: N/A;  . CAROTID  ENDARTERECTOMY  Left   1998  . CHOLECYSTECTOMY    . CORONARY STENT PLACEMENT  10/09/2015   DES to RCA  . LEFT HEART CATHETERIZATION WITH CORONARY ANGIOGRAM N/A 09/15/2013   Procedure: LEFT HEART CATHETERIZATION WITH CORONARY ANGIOGRAM;  Surgeon: Peter M Martinique, MD;  Location: [facility] CATH LAB;  Service: Cardiovascular;  Laterality: N/A;  . PROSTATECTOMY  1998   radical for prostate cancer    There were no vitals filed for this visit.      Subjective Assessment - 01/15/17 0844    Subjective doing HEP without any issues ( pt able to demo all), tried to vacuum and increased pain alot- reaching and pulling are bad   Currently in Pain? Yes   Pain Score 4    Pain Location Shoulder   Pain Orientation Right                         [facility] Adult PT Treatment/Exercise - 01/15/17 0001      Exercises   Exercises Shoulder;Neck     Shoulder Exercises: Standing   Other Standing Exercises yellow tband- ext,retraction , IR/ER  2 sets 10   Other Standing Exercises finger ladder flex and abd 5 times each  cane ex 10 times each     Shoulder Exercises: ROM/Strengthening  UBE (Upper Arm Bike) L 2 2 min fwd/2 back     Modalities   Modalities Social worker Location RT shld   Electrical Stimulation Action IFC   Electrical Stimulation Goals Pain                PT Education - 01/15/17 [phone]    Education provided Yes   Education Details issued cane ex   Person(s) Educated Patient   Methods Explanation;Demonstration;Handout   Comprehension Verbalized understanding;Returned demonstration          PT Short Term Goals - 01/15/17 0859      PT SHORT TERM GOAL #1   Title Demonstrate proper execution of HEP.    Status Achieved           PT Long Term Goals - 01/06/17 1124      PT LONG TERM GOAL #1   Title Decrease pain with activity to 5/10 at worst.    Baseline Pt reports pain of 8/10 at worst.   Time 8    Period Weeks   Status New     PT LONG TERM GOAL #2   Title Increase AROM of flexion 125 degrees.    Time 8   Period Weeks   Status New     PT LONG TERM GOAL #3   Title Pt. will be able to put on his shirt with 50% less difficulty.    Time 8   Period Weeks   Status New     PT LONG TERM GOAL #4   Title Pt will be able to reach opposite shoulder for bathing and personal hygiene.   Time 8   Period Weeks   Status New               Plan - 01/15/17 0881    Clinical Impression Statement pt with minimal to no pain at rest, pain increases with certain mvmt. Tolerated ex fair, cuing needed for posture and compensation. Pt demo HEP from eval and added cane ex. STG met   PT Treatment/Interventions ADLs/Self Care Home Management;Electrical Stimulation;Cryotherapy;Iontophoresis 89m/ml Dexamethasone;Moist Heat;Ultrasound;Patient/family education;Therapeutic exercise;Therapeutic activities;Manual techniques   PT Next Visit Plan check HEP and progress ROM/strength      Patient will benefit from skilled therapeutic intervention in order to improve the following deficits and impairments:     Visit Diagnosis: Acute pain of right shoulder  Stiffness of right shoulder, not elsewhere classified     Problem List Patient Active Problem List   Diagnosis Date Noted  . [place]is ([place] 10/09/2015  . Coronary artery disease   . Hypertension   . Hyperlipemia   . Carotid artery occlusion   . Cerumen impaction 08/30/2015  . Dizziness 08/09/2015  . Gait instability 05/30/2015  . Myalgia   . Weakness   . Bloating   . Chest pain at rest 03/24/2015  . Atypical chest pain 03/24/2015  . Fatigue 03/14/2015  . Hypothyroidism 10/26/2013  . OSA (obstructive sleep apnea) 10/18/2013  . Well adult health check 01/02/2013  . Other symptoms involving urinary system(788.99) 12/21/2012  . Benign localized hyperplasia of prostate with urinary obstruction and other lower urinary tract  symptoms (LUTS)(600.21) 12/21/2012  . Carcinoma in situ of prostate 12/21/2012  . Left shoulder pain 08/08/2012  . Obesity 07/24/2011  . Generalized anxiety disorder 07/24/2011  . DIVERTICULOSIS OF COLON 12/25/2009  . DERMATITIS, SEBORRHEIC 12/25/2009  . 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 Memory loss 08/14/2009  .  BACK PAIN, LUMBAR 07/19/2009  . PROSTATE CANCER 09/09/2006  . HYPERLIPIDEMIA 09/09/2006  . HYPERTENSION, BENIGN SYSTEMIC 09/09/2006  . Coronary atherosclerosis 09/09/2006  . VENOUS INSUFFICIENCY, CHRONIC 09/09/2006  . GASTROESOPHAGEAL REFLUX, NO ESOPHAGITIS 09/09/2006  . HERNIA, HIATAL, NONCONGENITAL 09/09/2006  . INSOMNIA NOS 09/09/2006    PAYSEUR,ANGIE PTA 01/15/2017, 9:16 AM  [address] [place], [place], [address] Phone: [phone]   Fax:  [phone]  Name: [name] MRN: [id number] Date of Birth: 11-25-1933

## 2017-01-22 ENCOUNTER — Ambulatory Visit: Payer: Medicare Other | Admitting: Physical Therapy

## 2017-01-22 DIAGNOSIS — M25611 Stiffness of right shoulder, not elsewhere classified: Secondary | ICD-10-CM

## 2017-01-22 DIAGNOSIS — M25511 Pain in right shoulder: Secondary | ICD-10-CM | POA: Diagnosis not present

## 2017-01-22 NOTE — Therapy (Signed)
[place], [place], [address] Phone: [phone]   Fax:  [phone]  Physical Therapy Treatment  Patient Details  Name: [name] MRN: [id number] Date of Birth: Oct 03, 1933 Referring Provider: Lamar Blinks  Encounter Date: 01/22/2017      PT End of Session - 01/22/17 1122    Visit Number 3   Date for PT Re-Evaluation 03/08/17   PT Start Time 4496   PT Stop Time 1150   PT Time Calculation (min) 57 min      Past Medical History:  Diagnosis Date  . Anginal pain ([place])   . Anxiety    " OCCASIONAL"  . Arthritis   . Asthma due to seasonal allergies    uses inhalers prn  . Carotid artery occlusion    Left  . Complication of anesthesia    " had tremors after prostate surgery  . Coronary artery disease   . Diverticulitis    recurrent  . Family history of anesthesia complication    MOTHER   . GERD (gastroesophageal reflux disease)   . H/O hiatal hernia   . Hearing aid worn   . Hyperlipemia   . Hypertension   . Kidney stone    lithotripsy 7591 w complications, req stents, Dr Rosana Hoes  . Prostate CA ([place])   . Shortness of breath   . TIA (transient ischemic attack)   . Tinnitus     Past Surgical History:  Procedure Laterality Date  . CARDIAC CATHETERIZATION  2008   minimal dz, Dr Cathie Olden  . CARDIAC CATHETERIZATION N/A 10/09/2015   Procedure: Left Heart Cath and Coronary Angiography;  Surgeon: Peter M Martinique, MD;  Location: [place] [facility];  Service: Cardiovascular;  Laterality: N/A;  . CARDIAC CATHETERIZATION N/A 10/09/2015   Procedure: Intravascular Pressure Wire/FFR Study;  Surgeon: Peter M Martinique, MD;  Location: [facility];  Service: Cardiovascular;  Laterality: N/A;  . CARDIAC CATHETERIZATION N/A 10/09/2015   Procedure: Coronary Stent Intervention;  Surgeon: Peter M Martinique, MD;  Location: [facility];  Service: Cardiovascular;  Laterality: N/A;  . CAROTID  ENDARTERECTOMY  Left   1998  . CHOLECYSTECTOMY    . CORONARY STENT PLACEMENT  10/09/2015   DES to RCA  . LEFT HEART CATHETERIZATION WITH CORONARY ANGIOGRAM N/A 09/15/2013   Procedure: LEFT HEART CATHETERIZATION WITH CORONARY ANGIOGRAM;  Surgeon: Peter M Martinique, MD;  Location: [facility];  Service: Cardiovascular;  Laterality: N/A;  . PROSTATECTOMY  1998   radical for prostate cancer    There were no vitals filed for this visit.      Subjective Assessment - 01/22/17 1052    Subjective doing HEP some, pain comes and goes ( pt verb increased pain all over)   Currently in Pain? Yes   Pain Score 4    Pain Location Shoulder   Pain Orientation Right            [facility] PT Assessment - 01/22/17 0001      AROM   AROM Assessment Site Shoulder   Right/Left Shoulder Right   Right Shoulder Flexion 125 Degrees   Right Shoulder ABduction 98 Degrees  with pain   Right Shoulder Internal Rotation 22 Degrees   Right Shoulder External Rotation 32 Degrees                     [facility] Adult PT Treatment/Exercise - 01/22/17 0001  Shoulder Exercises: Standing   Other Standing Exercises isometric IR/ER 10 each. doorway stretch   Other Standing Exercises yellow tband ext, retraction and IR/ER 10 each     Shoulder Exercises: ROM/Strengthening   UBE (Upper Arm Bike) L 2 2 min fwd/2 back     Modalities   Modalities Electrical Stimulation;Iontophoresis     Acupuncturist Location RT shld   Electrical Stimulation Action IFC   Electrical Stimulation Goals Pain     Iontophoresis   Type of Iontophoresis Dexamethasone   Location RT ant shld   Dose 1.2 cc   Time 4 hour leave on patch     Manual Therapy   Manual Therapy Passive ROM;Soft tissue mobilization   Soft tissue mobilization RT shld   Passive ROM RT shld                  PT Short Term Goals - 01/15/17 0859      PT SHORT TERM GOAL #1   Title Demonstrate proper execution of  HEP.    Status Achieved           PT Long Term Goals - 01/22/17 1122      PT LONG TERM GOAL #1   Title Decrease pain with activity to 5/10 at worst.    Status On-going     PT LONG TERM GOAL #2   Title Increase AROM of flexion 125 degrees.    Status Achieved     PT LONG TERM GOAL #3   Title Pt. will be able to put on his shirt with 50% less difficulty.    Status On-going     PT LONG TERM GOAL #4   Title Pt will be able to reach opposite shoulder for bathing and personal hygiene.   Status On-going               Plan - 01/22/17 1123    Clinical Impression Statement pt verb doing HEP some, but stops with pain. pt with limited tolerance to MT but does soem some increased ROM.  pt very pain focused. trial of ionto today for pain.   PT Next Visit Plan assess ionto. progress ROM/func strength      Patient will benefit from skilled therapeutic intervention in order to improve the following deficits and impairments:  Decreased strength, Postural dysfunction, Improper body mechanics, Impaired flexibility, Decreased range of motion, Impaired UE functional use, Pain  Visit Diagnosis: Acute pain of right shoulder  Stiffness of right shoulder, not elsewhere classified     Problem List Patient Active Problem List   Diagnosis Date Noted  . Angina pectoris ([place]) 10/09/2015  . Coronary artery disease   . Hypertension   . Hyperlipemia   . Carotid artery occlusion   . Cerumen impaction 08/30/2015  . Dizziness 08/09/2015  . Gait instability 05/30/2015  . Myalgia   . Weakness   . Bloating   . Chest pain at rest 03/24/2015  . Atypical chest pain 03/24/2015  . Fatigue 03/14/2015  . Hypothyroidism 10/26/2013  . OSA (obstructive sleep apnea) 10/18/2013  . Well adult health check 01/02/2013  . Other symptoms involving urinary system(788.99) 12/21/2012  . Benign localized hyperplasia of prostate with urinary obstruction and other lower urinary tract symptoms (LUTS)(600.21)  12/21/2012  . Carcinoma in situ of prostate 12/21/2012  . Left shoulder pain 08/08/2012  . Obesity 07/24/2011  . Generalized anxiety disorder 07/24/2011  . DIVERTICULOSIS OF COLON 12/25/2009  . DERMATITIS, SEBORRHEIC 12/25/2009  .  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 Memory loss 08/14/2009  . BACK PAIN, LUMBAR 07/19/2009  . PROSTATE CANCER 09/09/2006  . HYPERLIPIDEMIA 09/09/2006  . HYPERTENSION, BENIGN SYSTEMIC 09/09/2006  . Coronary atherosclerosis 09/09/2006  . VENOUS INSUFFICIENCY, CHRONIC 09/09/2006  . GASTROESOPHAGEAL REFLUX, NO ESOPHAGITIS 09/09/2006  . HERNIA, HIATAL, NONCONGENITAL 09/09/2006  . INSOMNIA NOS 09/09/2006    PAYSEUR,ANGIE PTA 01/22/2017, 11:25 AM  [place] [address] [place], [place], [address] Phone: [phone]   Fax:  [phone]  Name: [name] MRN: [id number] Date of Birth: [birth year]

## 2017-01-29 ENCOUNTER — Ambulatory Visit: Payer: Medicare Other | Admitting: Physical Therapy

## 2017-01-29 ENCOUNTER — Encounter: Payer: Self-pay | Admitting: Physical Therapy

## 2017-01-29 DIAGNOSIS — M25611 Stiffness of right shoulder, not elsewhere classified: Secondary | ICD-10-CM

## 2017-01-29 DIAGNOSIS — M25511 Pain in right shoulder: Secondary | ICD-10-CM | POA: Diagnosis not present

## 2017-01-29 NOTE — Therapy (Signed)
[place], [place], [address] Phone: [phone]   Fax:  [phone]  Physical Therapy Treatment  Patient Details  Name: [name] MRN: [id number] Date of Birth: 05-18-1934 Referring Provider: Lamar Blinks  Encounter Date: 01/29/2017      PT End of Session - 01/29/17 0954    Visit Number 4   Date for PT Re-Evaluation 03/08/17   PT Start Time 0922   PT Stop Time 1000   PT Time Calculation (min) 38 min      Past Medical History:  Diagnosis Date  . Anginal pain ([place])   . Anxiety    " OCCASIONAL"  . Arthritis   . Asthma due to seasonal allergies    uses inhalers prn  . Carotid artery occlusion    Left  . Complication of anesthesia    " had tremors after prostate surgery  . Coronary artery disease   . Diverticulitis    recurrent  . Family history of anesthesia complication    MOTHER   . GERD (gastroesophageal reflux disease)   . H/O hiatal hernia   . Hearing aid worn   . Hyperlipemia   . Hypertension   . Kidney stone    lithotripsy 7353 w complications, req stents, Dr Rosana Hoes  . Prostate CA ([place])   . Shortness of breath   . TIA (transient ischemic attack)   . Tinnitus     Past Surgical History:  Procedure Laterality Date  . CARDIAC CATHETERIZATION  2008   minimal dz, Dr Cathie Olden  . CARDIAC CATHETERIZATION N/A 10/09/2015   Procedure: Left Heart Cath and Coronary Angiography;  Surgeon: Peter M Martinique, MD;  Location: [place] [facility];  Service: Cardiovascular;  Laterality: N/A;  . CARDIAC CATHETERIZATION N/A 10/09/2015   Procedure: Intravascular Pressure Wire/FFR Study;  Surgeon: Peter M Martinique, MD;  Location: [place] [facility];  Service: Cardiovascular;  Laterality: N/A;  . CARDIAC CATHETERIZATION N/A 10/09/2015   Procedure: Coronary Stent Intervention;  Surgeon: Peter M Martinique, MD;  Location: [place] [facility];  Service: Cardiovascular;  Laterality: N/A;  . CAROTID  ENDARTERECTOMY  Left   1998  . CHOLECYSTECTOMY    . CORONARY STENT PLACEMENT  10/09/2015   DES to RCA  . LEFT HEART CATHETERIZATION WITH CORONARY ANGIOGRAM N/A 09/15/2013   Procedure: LEFT HEART CATHETERIZATION WITH CORONARY ANGIOGRAM;  Surgeon: Peter M Martinique, MD;  [facility] C[facility];  Service: Cardiovascular;  Laterality: N/A;  . PROSTATECTOMY  1998   radical for prostate cancer    There were no vitals filed for this visit.      Subjective Assessment - 01/29/17 0925    Subjective something is helping, not sure if tx or time. definately using shld more- still increase with certain mvmts ( " kind of dizzy today ,very tired-didn't sleep well")   Currently in Pain? Yes   Pain Score 2    Pain Location Shoulder   Pain Orientation Right            [facility] PT Assessment - 01/29/17 0001      AROM   AROM Assessment Site Shoulder   Right/Left Shoulder Right   Right Shoulder Flexion 134 Degrees   Right Shoulder ABduction 96 Degrees   Right Shoulder Internal Rotation 48 Degrees   Right Shoulder External Rotation 45 Degrees                     [facility]  Adult PT Treatment/Exercise - 01/29/17 0001      Shoulder Exercises: Seated   Extension Both;15 reps;Theraband   Theraband Level (Shoulder Extension) Level 1 (Yellow)   Retraction Strengthening;Both;15 reps;Theraband   Theraband Level (Shoulder Retraction) Level 1 (Yellow)   External Rotation Strengthening;Both;15 reps;Theraband   Theraband Level (Shoulder External Rotation) Level 1 (Yellow)   Other Seated Exercises 2# chest press, abd, IR/ER, empty can 10 each  2 sets   Other Seated Exercises 2# bent over row 2 sets 10     Shoulder Exercises: ROM/Strengthening   UBE (Upper Arm Bike) L 2 72mn fwd/3 back     Iontophoresis   Type of Iontophoresis Dexamethasone   Location RT ant shld   Dose 1.2 cc   Time 4 hour leave on patch  #2                  PT Short Term Goals - 01/15/17 0859      PT SHORT TERM  GOAL #1   Title Demonstrate proper execution of HEP.    Status Achieved           PT Long Term Goals - 01/29/17 0953      PT LONG TERM GOAL #1   Title Decrease pain with activity to 5/10 at worst.    Status Partially Met     PT LONG TERM GOAL #2   Title Increase AROM of flexion 125 degrees.    Status Achieved     PT LONG TERM GOAL #3   Title Pt. will be able to put on his shirt with 50% less difficulty.    Status On-going     PT LONG TERM GOAL #4   Title Pt will be able to reach opposite shoulder for bathing and personal hygiene.   Status Not Met               Plan - 01/29/17 0954    Clinical Impression Statement pt extremely tired today and falling alseep during session, however, pt did tol increased ex today and measured increased AROM. pt verb PT helping. Pt still limited tolerance to MT. Only did ionto today after ex d/t extreme fatigue and wanting pt to get home safely   PT Next Visit Plan continue with func strengthening as pt is slowing getting more motion with less pain. add to HEP if pt reports compliance with currently est HEP      Patient will benefit from skilled therapeutic intervention in order to improve the following deficits and impairments:  Decreased strength, Postural dysfunction, Improper body mechanics, Impaired flexibility, Decreased range of motion, Impaired UE functional use, Pain  Visit Diagnosis: Acute pain of right shoulder  Stiffness of right shoulder, not elsewhere classified     Problem List Patient Active Problem List   Diagnosis Date Noted  . Angina pectoris ([place] 10/09/2015  . Coronary artery disease   . Hypertension   . Hyperlipemia   . Carotid artery occlusion   . Cerumen impaction 08/30/2015  . Dizziness 08/09/2015  . Gait instability 05/30/2015  . Myalgia   . Weakness   . Bloating   . Chest pain at rest 03/24/2015  . Atypical chest pain 03/24/2015  . Fatigue 03/14/2015  . Hypothyroidism 10/26/2013  . OSA  (obstructive sleep apnea) 10/18/2013  . Well adult health check 01/02/2013  . Other symptoms involving urinary system(788.99) 12/21/2012  . Benign localized hyperplasia of prostate with urinary obstruction and other lower urinary tract symptoms (LUTS)(600.21) 12/21/2012  .  Carcinoma in situ of prostate 12/21/2012  . Left shoulder pain 08/08/2012  . Obesity 07/24/2011  . Generalized anxiety disorder 07/24/2011  . DIVERTICULOSIS OF COLON 12/25/2009  . DERMATITIS, SEBORRHEIC 12/25/2009  . 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 Memory loss 08/14/2009  . BACK PAIN, LUMBAR 07/19/2009  . PROSTATE CANCER 09/09/2006  . HYPERLIPIDEMIA 09/09/2006  . HYPERTENSION, BENIGN SYSTEMIC 09/09/2006  . Coronary atherosclerosis 09/09/2006  . VENOUS INSUFFICIENCY, CHRONIC 09/09/2006  . GASTROESOPHAGEAL REFLUX, NO ESOPHAGITIS 09/09/2006  . HERNIA, HIATAL, NONCONGENITAL 09/09/2006  . INSOMNIA NOS 09/09/2006    PAYSEUR,ANGIE PTA 01/29/2017, 9:57 AM  [place] [address] [place], [place], [address] Phone: [phone]   Fax:  [phone]  Name: [name] MRN: [id number] Date of Birth: [birth year]

## 2017-02-03 ENCOUNTER — Ambulatory Visit: Payer: Medicare Other | Admitting: Physical Therapy

## 2017-02-03 ENCOUNTER — Encounter: Payer: Self-pay | Admitting: Physical Therapy

## 2017-02-03 DIAGNOSIS — M25511 Pain in right shoulder: Secondary | ICD-10-CM | POA: Diagnosis not present

## 2017-02-03 DIAGNOSIS — M25611 Stiffness of right shoulder, not elsewhere classified: Secondary | ICD-10-CM

## 2017-02-03 NOTE — Therapy (Signed)
[place], [place], [address] Phone: [phone]   Fax:  [phone]  Physical Therapy Treatment  Patient Details  Name: [name] MRN: [id number] Date of Birth: 12/21/1933 Referring Provider: Lamar Blinks  Encounter Date: 02/03/2017      PT End of Session - 02/03/17 1048    Visit Number 5   Date for PT Re-Evaluation 03/08/17   PT Start Time 0925   PT Stop Time 1015   PT Time Calculation (min) 50 min   Activity Tolerance Patient limited by lethargy   Behavior During Therapy [facility] for tasks assessed/performed      Past Medical History:  Diagnosis Date  . Anginal pain ([place])   . Anxiety    " OCCASIONAL"  . Arthritis   . Asthma due to seasonal allergies    uses inhalers prn  . Carotid artery occlusion    Left  . Complication of anesthesia    " had tremors after prostate surgery  . Coronary artery disease   . Diverticulitis    recurrent  . Family history of anesthesia complication    MOTHER   . GERD (gastroesophageal reflux disease)   . H/O hiatal hernia   . Hearing aid worn   . Hyperlipemia   . Hypertension   . Kidney stone    lithotripsy 2585 w complications, req stents, Dr Rosana Hoes  . Prostate CA ([place])   . Shortness of breath   . TIA (transient ischemic attack)   . Tinnitus     Past Surgical History:  Procedure Laterality Date  . CARDIAC CATHETERIZATION  2008   minimal dz, Dr Cathie Olden  . CARDIAC CATHETERIZATION N/A 10/09/2015   Procedure: Left Heart Cath and Coronary Angiography;  Surgeon: Peter M Martinique, MD;  Location: [place] [facility];  Service: Cardiovascular;  Laterality: N/A;  . CARDIAC CATHETERIZATION N/A 10/09/2015   Procedure: Intravascular Pressure Wire/FFR Study;  Surgeon: Peter M Martinique, MD;  Location: [place] [facility];  Service: Cardiovascular;  Laterality: N/A;  . CARDIAC CATHETERIZATION N/A 10/09/2015   Procedure: Coronary Stent Intervention;  Surgeon:  Peter M Martinique, MD;  Location: [place] [facility];  Service: Cardiovascular;  Laterality: N/A;  . CAROTID ENDARTERECTOMY  Left   1998  . CHOLECYSTECTOMY    . CORONARY STENT PLACEMENT  10/09/2015   DES to RCA  . LEFT HEART CATHETERIZATION WITH CORONARY ANGIOGRAM N/A 09/15/2013   Procedure: LEFT HEART CATHETERIZATION WITH CORONARY ANGIOGRAM;  Surgeon: Peter M Martinique, MD;  Location: [facility] CATH LAB;  Service: Cardiovascular;  Laterality: N/A;  . PROSTATECTOMY  1998   radical for prostate cancer    There were no vitals filed for this visit.      Subjective Assessment - 02/03/17 0925    Subjective Patient reports that he is feeling much better recently, he reports that he is using the arm a little more   Currently in Pain? Yes   Pain Score 2    Pain Location Shoulder   Pain Orientation Right   Aggravating Factors  use of the arm   Pain Relieving Factors shower                         [facility] Adult PT Treatment/Exercise - 02/03/17 0001      Neck Exercises: Machines for Strengthening   Cybex Row 10# 2x15   Cybex Chest Press 5# 2x10 with PT limiting how far back he  goes to decrease pain   Other Machines for Strengthening lat pulls 15# 2x10     Neck Exercises: Theraband   Scapula Retraction 20 reps;Green   Shoulder Extension 20 reps;Green     Neck Exercises: Standing   Wall Push Ups 10 reps   Wall Wash 20 flexion     Shoulder Exercises: Seated   Other Seated Exercises 2# bent over row 2 sets 10     Shoulder Exercises: ROM/Strengthening   UBE (Upper Arm Bike) L 5 82mn fwd/3 back   Other ROM/Strengthening Exercises wand exercises     Iontophoresis   Type of Iontophoresis Dexamethasone   Location RT ant shld   Dose 1.2 cc   Time 4 hour leave on patch#3     Manual Therapy   Manual Therapy Passive ROM;Soft tissue mobilization   Passive ROM RT shld all GH joint motions to end range                  PT Short Term Goals - 01/15/17 0859      PT SHORT  TERM GOAL #1   Title Demonstrate proper execution of HEP.    Status Achieved           PT Long Term Goals - 02/03/17 1050      PT LONG TERM GOAL #1   Title Decrease pain with activity to 5/10 at worst.    Status Partially Met     PT LONG TERM GOAL #4   Title Pt will be able to reach opposite shoulder for bathing and personal hygiene.   Status Partially Met               Plan - 02/03/17 1049    Clinical Impression Statement Patient much better today with mood and affect, reports that he did change some medications, he tolerated the exercises well but he does not like the stretches.  Especially into ER/IR and horizontal abduction   PT Next Visit Plan may give HEP for stretching   Consulted and Agree with Plan of Care Patient      Patient will benefit from skilled therapeutic intervention in order to improve the following deficits and impairments:  Decreased strength, Postural dysfunction, Improper body mechanics, Impaired flexibility, Decreased range of motion, Impaired UE functional use, Pain  Visit Diagnosis: Acute pain of right shoulder  Stiffness of right shoulder, not elsewhere classified     Problem List Patient Active Problem List   Diagnosis Date Noted  . [place]is ([place] 10/09/2015  . Coronary artery disease   . Hypertension   . Hyperlipemia   . Carotid artery occlusion   . Cerumen impaction 08/30/2015  . Dizziness 08/09/2015  . Gait instability 05/30/2015  . Myalgia   . Weakness   . Bloating   . Chest pain at rest 03/24/2015  . Atypical chest pain 03/24/2015  . Fatigue 03/14/2015  . Hypothyroidism 10/26/2013  . OSA (obstructive sleep apnea) 10/18/2013  . Well adult health check 01/02/2013  . Other symptoms involving urinary system(788.99) 12/21/2012  . Benign localized hyperplasia of prostate with urinary obstruction and other lower urinary tract symptoms (LUTS)(600.21) 12/21/2012  . Carcinoma in situ of prostate 12/21/2012  . Left  shoulder pain 08/08/2012  . Obesity 07/24/2011  . Generalized anxiety disorder 07/24/2011  . DIVERTICULOSIS OF COLON 12/25/2009  . DERMATITIS, SEBORRHEIC 12/25/2009  . 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 Memory loss 08/14/2009  . BACK PAIN, LUMBAR 07/19/2009  . PROSTATE CANCER 09/09/2006  . HYPERLIPIDEMIA  09/09/2006  . HYPERTENSION, BENIGN SYSTEMIC 09/09/2006  . Coronary atherosclerosis 09/09/2006  . VENOUS INSUFFICIENCY, CHRONIC 09/09/2006  . GASTROESOPHAGEAL REFLUX, NO ESOPHAGITIS 09/09/2006  . HERNIA, HIATAL, NONCONGENITAL 09/09/2006  . INSOMNIA NOS 09/09/2006    Sumner Boast., PT 02/03/2017, 10:51 AM  [facility] [place] [address] [place], [place], [address] Phone: [phone]   Fax:  [phone]  Name: [name] MRN: [id number] Date of Birth: 11-Feb-1934

## 2017-02-10 ENCOUNTER — Ambulatory Visit: Payer: Medicare Other | Attending: Family Medicine | Admitting: Physical Therapy

## 2017-02-10 ENCOUNTER — Encounter: Payer: Self-pay | Admitting: Physical Therapy

## 2017-02-10 DIAGNOSIS — M25511 Pain in right shoulder: Secondary | ICD-10-CM

## 2017-02-10 DIAGNOSIS — M25611 Stiffness of right shoulder, not elsewhere classified: Secondary | ICD-10-CM | POA: Diagnosis present

## 2017-02-10 NOTE — Therapy (Signed)
[place] [address] [place], [place], [address] Phone: [phone]   Fax:  [phone]  Physical Therapy Treatment  Patient Details  Name: [name] MRN: [id number] Date of Birth: [birth year] Referring Provider: Lamar Blinks  Encounter Date: 02/10/2017      PT End of Session - 02/10/17 1055    Visit Number 6   Date for PT Re-Evaluation 03/08/17   PT Start Time 1013   PT Stop Time 1058   PT Time Calculation (min) 45 min   Activity Tolerance Patient tolerated treatment well      Past Medical History:  Diagnosis Date  . Anginal pain ([place])   . Anxiety    " OCCASIONAL"  . Arthritis   . Asthma due to seasonal allergies    uses inhalers prn  . Carotid artery occlusion    Left  . Complication of anesthesia    " had tremors after prostate surgery  . Coronary artery disease   . Diverticulitis    recurrent  . Family history of anesthesia complication    MOTHER   . GERD (gastroesophageal reflux disease)   . H/O hiatal hernia   . Hearing aid worn   . Hyperlipemia   . Hypertension   . Kidney stone    lithotripsy 5701 w complications, req stents, Dr Rosana Hoes  . Prostate CA ([place])   . Shortness of breath   . TIA (transient ischemic attack)   . Tinnitus     Past Surgical History:  Procedure Laterality Date  . CARDIAC CATHETERIZATION  2008   minimal dz, Dr Cathie Olden  . CARDIAC CATHETERIZATION N/A 10/09/2015   Procedure: Left Heart Cath and Coronary Angiography;  Surgeon: Peter M Martinique, MD;  Location: [facility];  Service: Cardiovascular;  Laterality: N/A;  . CARDIAC CATHETERIZATION N/A 10/09/2015   Procedure: Intravascular Pressure Wire/FFR Study;  Surgeon: Peter M Martinique, MD;  Location: [facility];  Service: Cardiovascular;  Laterality: N/A;  . CARDIAC CATHETERIZATION N/A 10/09/2015   Procedure: Coronary Stent Intervention;  Surgeon: Peter M Martinique, MD;  Location: [facility];  Service:  Cardiovascular;  Laterality: N/A;  . CAROTID ENDARTERECTOMY  Left   1998  . CHOLECYSTECTOMY    . CORONARY STENT PLACEMENT  10/09/2015   DES to RCA  . LEFT HEART CATHETERIZATION WITH CORONARY ANGIOGRAM N/A 09/15/2013   Procedure: LEFT HEART CATHETERIZATION WITH CORONARY ANGIOGRAM;  Surgeon: Peter M Martinique, MD;  Location[facility];  Service: Cardiovascular;  Laterality: N/A;  . PROSTATECTOMY  1998   radical for prostate cancer    There were no vitals filed for this visit.      Subjective Assessment - 02/10/17 1014    Subjective Patient reports that he is feeling very tired, reports that he did not sleep well last night.  Reports that he feels like he is moving the arm a little better   Currently in Pain? No/denies            [facility] PT Assessment - 02/10/17 0001      AROM   AROM Assessment Site Shoulder   Right/Left Shoulder Right   Right Shoulder Flexion 134 Degrees   Right Shoulder ABduction 90 Degrees   Right Shoulder Internal Rotation 48 Degrees   Right Shoulder External Rotation 48 Degrees                     [facility] Adult PT Treatment/Exercise - 02/10/17  0001      Neck Exercises: Machines for Strengthening   Cybex Row 10# 2x15   Cybex Chest Press 5# 2x10 with PT limiting how far back he goes to decrease pain   Other Machines for Strengthening lat pulls 15# 2x10   Other Machines for Strengthening 25# triceps, 5# biceps     Neck Exercises: Theraband   Scapula Retraction 20 reps;Green   Shoulder Extension 20 reps;Green   Shoulder External Rotation Red;20 reps   Other Theraband Exercises red tband IR 20 reps     Neck Exercises: Standing   Wall Push Ups 10 reps   Wall Wash 20 flexion     Shoulder Exercises: Seated   Other Seated Exercises 2# bent over row 2 sets 10, extensions     Shoulder Exercises: ROM/Strengthening   UBE (Upper Arm Bike) L 5 62mn fwd/3 back   Other ROM/Strengthening Exercises wand exercises     Manual Therapy   Manual Therapy  Passive ROM;Soft tissue mobilization   Passive ROM RT shld all GH joint motions to end range                  PT Short Term Goals - 01/15/17 0859      PT SHORT TERM GOAL #1   Title Demonstrate proper execution of HEP.    Status Achieved           PT Long Term Goals - 02/10/17 1057      PT LONG TERM GOAL #1   Title Decrease pain with activity to 5/10 at worst.    Status Partially Met     PT LONG TERM GOAL #2   Title Increase AROM of flexion 125 degrees.    Status Achieved     PT LONG TERM GOAL #3   Title Pt. will be able to put on his shirt with 50% less difficulty.    Status Partially Met               Plan - 02/10/17 1055    Clinical Impression Statement Patient has some very tender areas in the infraspinatus and teres area, small trigger points but very painful.  His ROM is about what it was last week but much improved from the start, he has painful and limited ER and abduction   PT Next Visit Plan continue to work on the ROM and function   Consulted and Agree with Plan of Care Patient      Patient will benefit from skilled therapeutic intervention in order to improve the following deficits and impairments:  Decreased strength, Postural dysfunction, Improper body mechanics, Impaired flexibility, Decreased range of motion, Impaired UE functional use, Pain  Visit Diagnosis: Acute pain of right shoulder  Stiffness of right shoulder, not elsewhere classified     Problem List Patient Active Problem List   Diagnosis Date Noted  . Angina pectoris ([place] 10/09/2015  . Coronary artery disease   . Hypertension   . Hyperlipemia   . Carotid artery occlusion   . Cerumen impaction 08/30/2015  . Dizziness 08/09/2015  . Gait instability 05/30/2015  . Myalgia   . Weakness   . Bloating   . Chest pain at rest 03/24/2015  . Atypical chest pain 03/24/2015  . Fatigue 03/14/2015  . Hypothyroidism 10/26/2013  . OSA (obstructive sleep apnea) 10/18/2013  .  Well adult health check 01/02/2013  . Other symptoms involving urinary system(788.99) 12/21/2012  . Benign localized hyperplasia of prostate with urinary obstruction and other lower urinary  tract symptoms (LUTS)(600.21) 12/21/2012  . Carcinoma in situ of prostate 12/21/2012  . Left shoulder pain 08/08/2012  . Obesity 07/24/2011  . Generalized anxiety disorder 07/24/2011  . DIVERTICULOSIS OF COLON 12/25/2009  . DERMATITIS, SEBORRHEIC 12/25/2009  . 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 Memory loss 08/14/2009  . BACK PAIN, LUMBAR 07/19/2009  . PROSTATE CANCER 09/09/2006  . HYPERLIPIDEMIA 09/09/2006  . HYPERTENSION, BENIGN SYSTEMIC 09/09/2006  . Coronary atherosclerosis 09/09/2006  . VENOUS INSUFFICIENCY, CHRONIC 09/09/2006  . GASTROESOPHAGEAL REFLUX, NO ESOPHAGITIS 09/09/2006  . HERNIA, HIATAL, NONCONGENITAL 09/09/2006  . INSOMNIA NOS 09/09/2006    Sumner Boast., PT 02/10/2017, 10:58 AM  [facility] [address] [place], [place], [address] Phone: [phone]   Fax:  [phone]  Name: [name] MRN: [id number] Date of Birth: [birth year]

## 2017-02-13 IMAGING — CT CT HEAD W/O CM
1 series · 16 of 30 positions shown, 20 images · non-contrast
Comparison: None.

CLINICAL DATA: Flu shot yesterday around 3 p.m.. Generalized
weakness, back pain, dizziness, and tremors.

EXAM:
CT HEAD WITHOUT CONTRAST
TECHNIQUE: Contiguous axial images were obtained from the base of the skull
through the vertex without intravenous contrast.

[Series 2: head 4.8 h37s · axial · 0.47mm/px · z∈[+1080,+1240]mm · 16 of 36 slices shown, 20 images]
[im 2/36  brain]
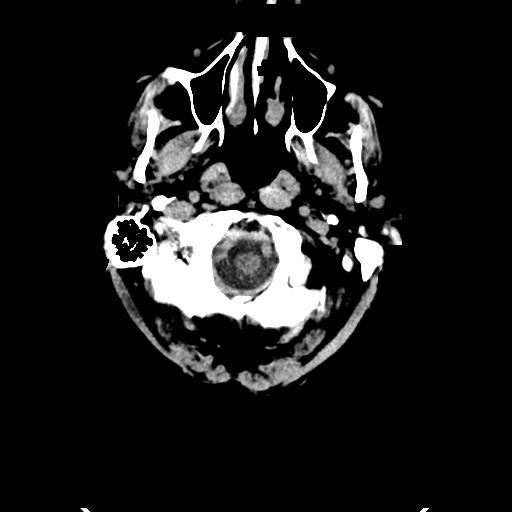
[im 2/36  bone]
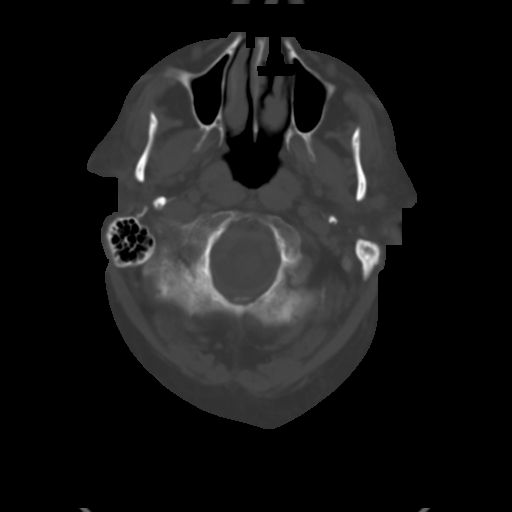
[im 4/36  brain]
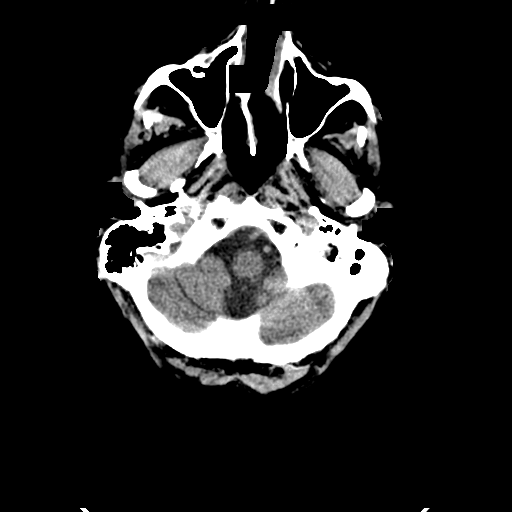
[im 7/36  brain]
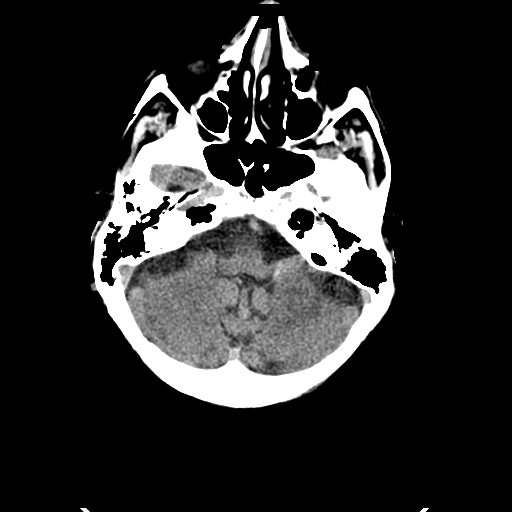
[im 9/36  brain]
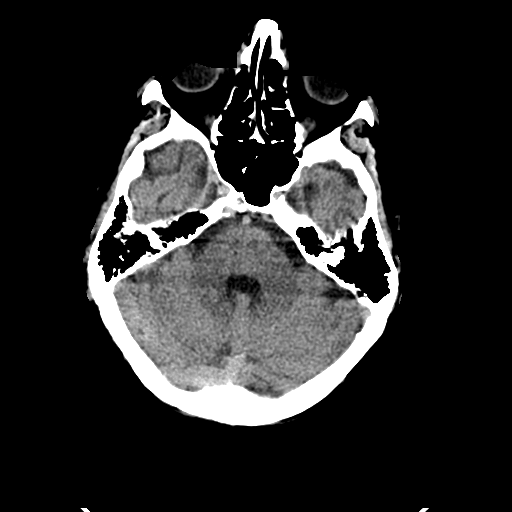
[im 10/36  brain]
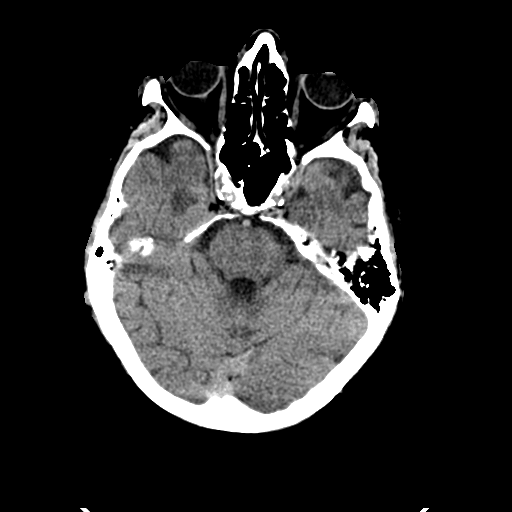
[im 10/36  bone]
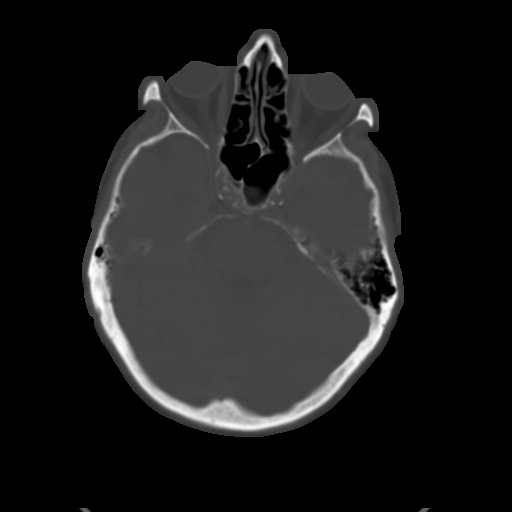
[im 13/36  brain]
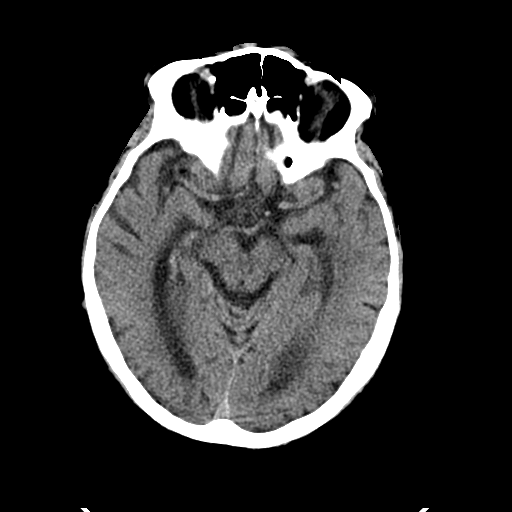
[im 15/36  brain]
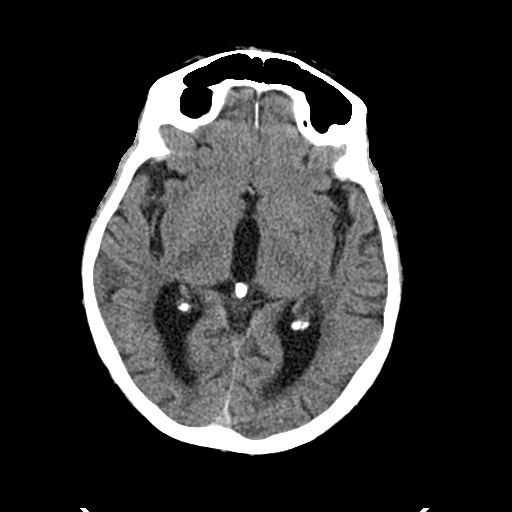
[im 17/36  brain]
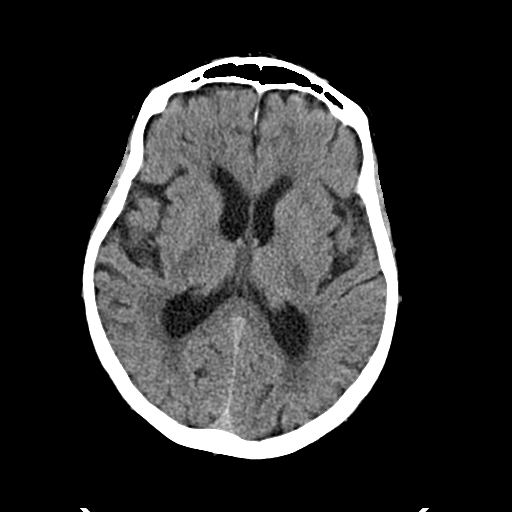
[im 19/36  brain]
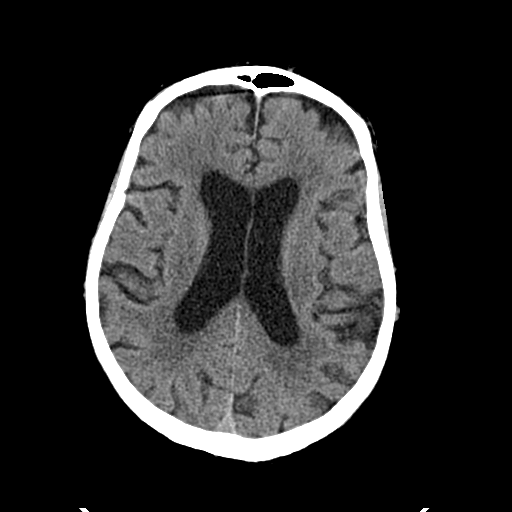
[im 19/36  bone]
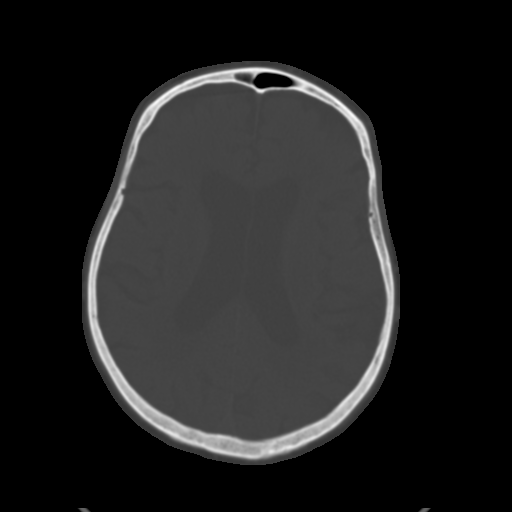
[im 21/36  brain]
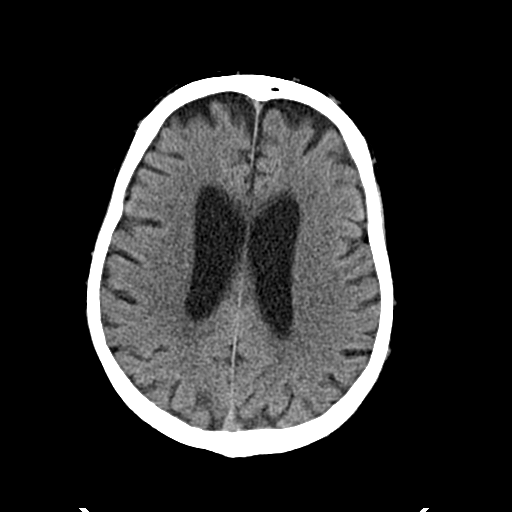
[im 23/36  brain]
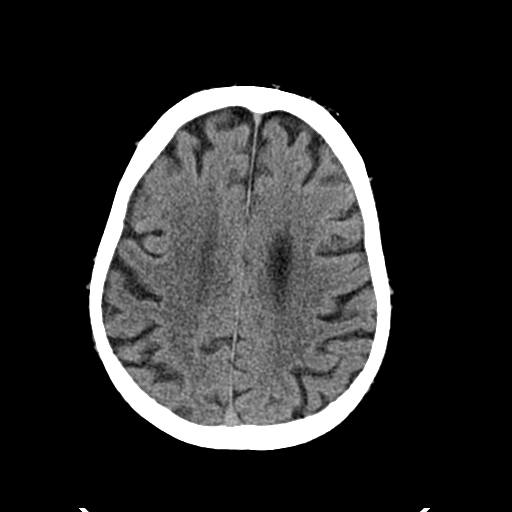
[im 26/36  brain]
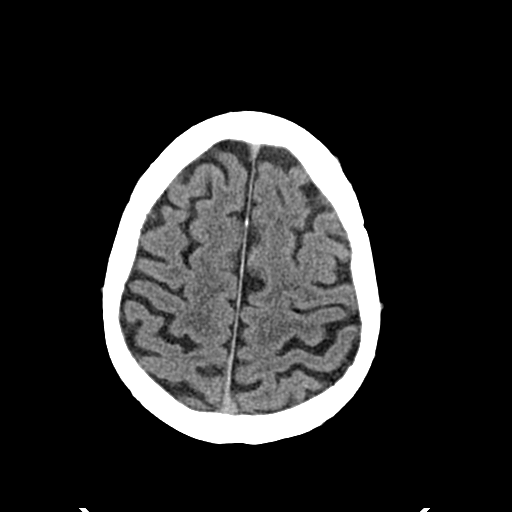
[im 27/36  brain]
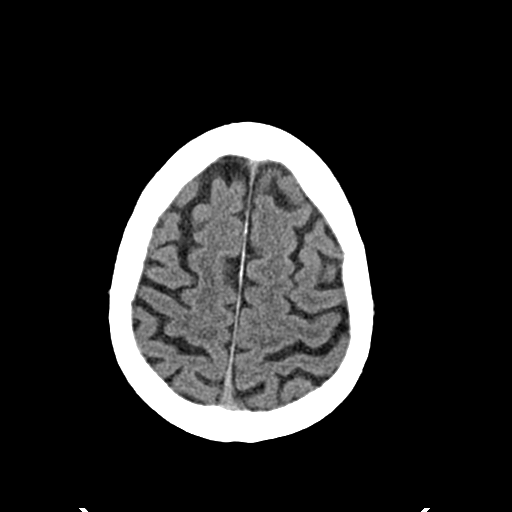
[im 27/36  bone]
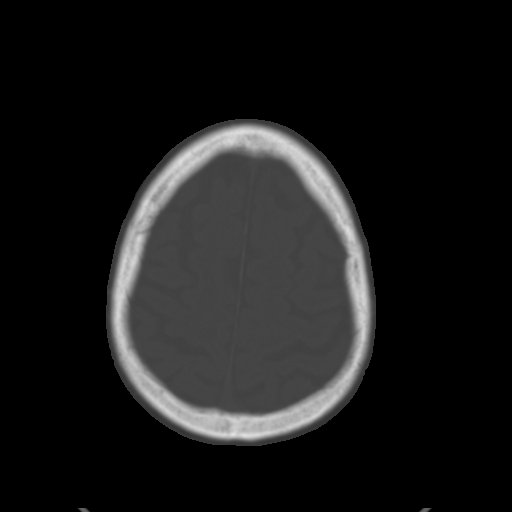
[im 29/36  brain]
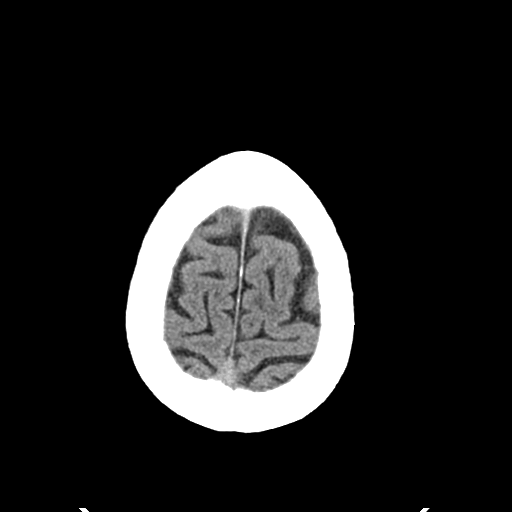
[im 32/36  brain]
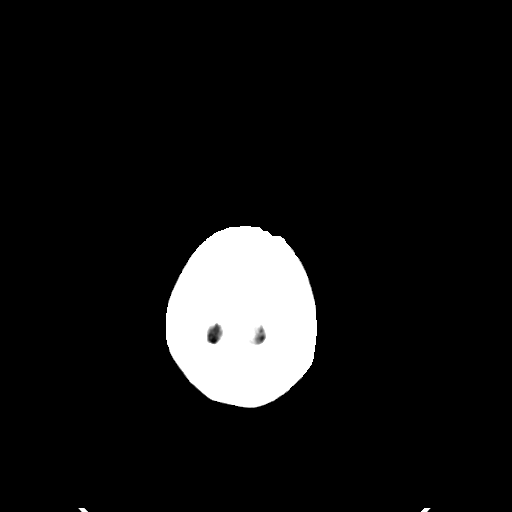
[im 34/36  brain]
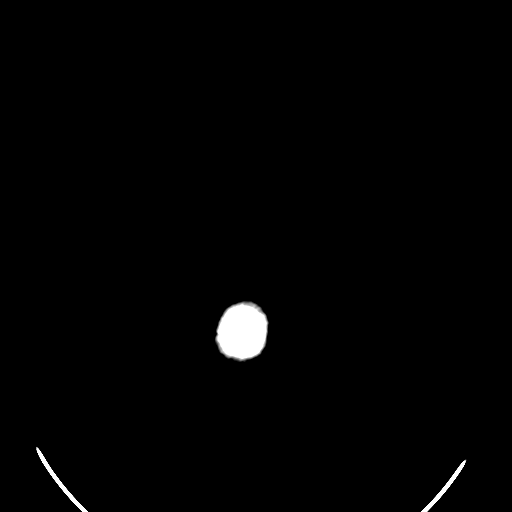

[16 of 30 positions shown; findings below may reference images not displayed]

FINDINGS: Diffuse cerebral atrophy. Ventricular dilatation consistent with
central atrophy. Low-attenuation changes in the deep white matter
consistent with small vessel ischemia. No mass effect or midline
shift. No abnormal extra-axial fluid collections. Gray-white matter
junctions are distinct. Basal cisterns are not effaced. No evidence
of acute intracranial hemorrhage. No depressed skull fractures.
Visualized paranasal sinuses and mastoid air cells are not
opacified.
IMPRESSION: No acute intracranial abnormalities. Chronic atrophy and small
vessel ischemic changes.

## 2017-02-17 ENCOUNTER — Other Ambulatory Visit: Payer: Self-pay | Admitting: Family Medicine

## 2017-02-19 ENCOUNTER — Encounter: Payer: Self-pay | Admitting: Physical Therapy

## 2017-02-19 ENCOUNTER — Ambulatory Visit: Payer: Medicare Other | Admitting: Physical Therapy

## 2017-02-19 DIAGNOSIS — M25611 Stiffness of right shoulder, not elsewhere classified: Secondary | ICD-10-CM

## 2017-02-19 DIAGNOSIS — M25511 Pain in right shoulder: Secondary | ICD-10-CM

## 2017-02-19 NOTE — Therapy (Signed)
[facility] [address] [place], [place], [address] Phone: [phone]   Fax:  [phone]  Physical Therapy Treatment  Patient Details  Name: [name] MRN: [id number] Date of Birth: 11/21/1933 Referring Provider: Lamar Blinks  Encounter Date: 02/19/2017      PT End of Session - 02/19/17 1106    Visit Number 7   Date for PT Re-Evaluation 03/08/17   PT Start Time 1055   PT Stop Time 1135   PT Time Calculation (min) 40 min      Past Medical History:  Diagnosis Date  . Anginal pain ([place])   . Anxiety    " OCCASIONAL"  . Arthritis   . Asthma due to seasonal allergies    uses inhalers prn  . Carotid artery occlusion    Left  . Complication of anesthesia    " had tremors after prostate surgery  . Coronary artery disease   . Diverticulitis    recurrent  . Family history of anesthesia complication    MOTHER   . GERD (gastroesophageal reflux disease)   . H/O hiatal hernia   . Hearing aid worn   . Hyperlipemia   . Hypertension   . Kidney stone    lithotripsy 6384 w complications, req stents, Dr Rosana Hoes  . Prostate CA ([place])   . Shortness of breath   . TIA (transient ischemic attack)   . Tinnitus     Past Surgical History:  Procedure Laterality Date  . CARDIAC CATHETERIZATION  2008   minimal dz, Dr Cathie Olden  . CARDIAC CATHETERIZATION N/A 10/09/2015   Procedure: Left Heart Cath and Coronary Angiography;  Surgeon: Peter M Martinique, MD;  Location: [facility];  Service: Cardiovascular;  Laterality: N/A;  . CARDIAC CATHETERIZATION N/A 10/09/2015   Procedure: Intravascular Pressure Wire/FFR Study;  Surgeon: Peter M Martinique, MD;  Location: [facility];  Service: Cardiovascular;  Laterality: N/A;  . CARDIAC CATHETERIZATION N/A 10/09/2015   Procedure: Coronary Stent Intervention;  Surgeon: Peter M Martinique, MD;  Location: [facility];  Service: Cardiovascular;  Laterality: N/A;  . CAROTID  ENDARTERECTOMY  Left   1998  . CHOLECYSTECTOMY    . CORONARY STENT PLACEMENT  10/09/2015   DES to RCA  . LEFT HEART CATHETERIZATION WITH CORONARY ANGIOGRAM N/A 09/15/2013   Procedure: LEFT HEART CATHETERIZATION WITH CORONARY ANGIOGRAM;  Surgeon: Peter M Martinique, MD;  Location: [facility];  Service: Cardiovascular;  Laterality: N/A;  . PROSTATECTOMY  1998   radical for prostate cancer    There were no vitals filed for this visit.      Subjective Assessment - 02/19/17 1054    Subjective 80% better overall with most activities still pain with pulling or certain  mvmt   Currently in Pain? No/denies            [facility] PT Assessment - 02/19/17 0001      AROM   AROM Assessment Site Shoulder   Right/Left Shoulder Right   Right Shoulder Flexion 146 Degrees   Right Shoulder ABduction 110 Degrees   Right Shoulder Internal Rotation 60 Degrees   Right Shoulder External Rotation 65 Degrees     Strength   Overall Strength Within functional limits for tasks performed  below 90                     [facility] Adult PT Treatment/Exercise - 02/19/17 0001      Neck  Exercises: Machines for Strengthening   Cybex Row 25# 2 sets 15   Cybex Chest Press 5# 2x10   no assistance needed   Other Machines for Strengthening lat pulls 15# 2x15   Other Machines for Strengthening 25# triceps, 5# biceps     Neck Exercises: Standing   Wall Push Ups 20 reps     Shoulder Exercises: Standing   External Rotation Strengthening;Right;Weights;10 reps  2 sets D2 flex 2#   Flexion Strengthening;Right;20 reps;Weights   Shoulder Flexion Weight (lbs) 2   ABduction Strengthening;Right;20 reps;Weights   Shoulder ABduction Weight (lbs) 2   Other Standing Exercises cabinet reaching flex and abd 2# 10 times each     Shoulder Exercises: ROM/Strengthening   UBE (Upper Arm Bike) L 5 14mn fwd/3 back                  PT Short Term Goals - 01/15/17 0859      PT SHORT TERM GOAL #1   Title  Demonstrate proper execution of HEP.    Status Achieved           PT Long Term Goals - 02/19/17 1105      PT LONG TERM GOAL #1   Title Decrease pain with activity to 5/10 at worst.    Status Achieved     PT LONG TERM GOAL #2   Title Increase AROM of flexion 125 degrees.    Status Achieved     PT LONG TERM GOAL #3   Title Pt. will be able to put on his shirt with 50% less difficulty.    Status Achieved     PT LONG TERM GOAL #4   Title Pt will be able to reach opposite shoulder for bathing and personal hygiene.   Status Achieved     PT LONG TERM GOAL #5   Title Independent with gym transition   Time 4   Period Weeks   Status New     Additional Long Term Goals   Additional Long Term Goals Yes     PT LONG TERM GOAL #6   Title lift 3# > 90 degree 10 x for increased functional strength   Time 4   Period Weeks   Status New     PT LONG TERM GOAL #7   Title MMT flexion and abduction at 90 degrees 4/5   Time 4   Period Weeks   Status New               Plan - 02/19/17 1107    Clinical Impression Statement all goals met. discussed with PT to set new goals to work on increased strength as above 90 is weak and painful . also pt working on resuming gym ex   PT Treatment/Interventions ADLs/Self Care Home Management;Electrical Stimulation;Cryotherapy;Iontophoresis 450mml Dexamethasone;Moist Heat;Ultrasound;Patient/family education;Therapeutic exercise;Therapeutic activities;Manual techniques   PT Next Visit Plan strength and func esp over 90 and transition back into gym . Pt headed OOT 10-14 days so will reassess when pt returns.      Patient will benefit from skilled therapeutic intervention in order to improve the following deficits and impairments:  Decreased strength, Postural dysfunction, Improper body mechanics, Impaired flexibility, Decreased range of motion, Impaired UE functional use, Pain  Visit Diagnosis: Acute pain of right shoulder  Stiffness of right  shoulder, not elsewhere classified     Problem List Patient Active Problem List   Diagnosis Date Noted  . Angina pectoris ([place]/29/2017  . Coronary artery disease   .  Hypertension   . Hyperlipemia   . Carotid artery occlusion   . Cerumen impaction 08/30/2015  . Dizziness 08/09/2015  . Gait instability 05/30/2015  . Myalgia   . Weakness   . Bloating   . Chest pain at rest 03/24/2015  . Atypical chest pain 03/24/2015  . Fatigue 03/14/2015  . Hypothyroidism 10/26/2013  . OSA (obstructive sleep apnea) 10/18/2013  . Well adult health check 01/02/2013  . Other symptoms involving urinary system(788.99) 12/21/2012  . Benign localized hyperplasia of prostate with urinary obstruction and other lower urinary tract symptoms (LUTS)(600.21) 12/21/2012  . Carcinoma in situ of prostate 12/21/2012  . Left shoulder pain 08/08/2012  . Obesity 07/24/2011  . Generalized anxiety disorder 07/24/2011  . DIVERTICULOSIS OF COLON 12/25/2009  . DERMATITIS, SEBORRHEIC 12/25/2009  . 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 Memory loss 08/14/2009  . BACK PAIN, LUMBAR 07/19/2009  . PROSTATE CANCER 09/09/2006  . HYPERLIPIDEMIA 09/09/2006  . HYPERTENSION, BENIGN SYSTEMIC 09/09/2006  . Coronary atherosclerosis 09/09/2006  . VENOUS INSUFFICIENCY, CHRONIC 09/09/2006  . GASTROESOPHAGEAL REFLUX, NO ESOPHAGITIS 09/09/2006  . HERNIA, HIATAL, NONCONGENITAL 09/09/2006  . INSOMNIA NOS 09/09/2006    Sumner Boast., PT 02/19/2017, 11:48 AM  [facility] [address] [place], [place], [address] Phone: [phone]   Fax:  [phone]  Name: [name] MRN: [id number] Date of Birth: 1934/06/21

## 2017-03-03 LAB — BASIC METABOLIC PANEL
BUN: 20 (ref 4–21)
Creatinine: 1.5 — AB (ref 0.6–1.3)
Glucose: 78
Potassium: 4.6 (ref 3.4–5.3)
SODIUM: 141 (ref 137–147)

## 2017-03-03 LAB — HEPATIC FUNCTION PANEL
ALK PHOS: 56 (ref 25–125)
ALT: 26 (ref 10–40)
AST: 27 (ref 14–40)
BILIRUBIN, TOTAL: 1.7

## 2017-03-03 LAB — HEMOGLOBIN A1C: HEMOGLOBIN A1C: 5.2

## 2017-03-03 LAB — TSH: TSH: 6.99 — AB (ref 0.41–5.90)

## 2017-03-03 LAB — CBC AND DIFFERENTIAL
HCT: 44 (ref 41–53)
HEMATOCRIT: 44 (ref 41–53)
HEMOGLOBIN: 14.8 (ref 13.5–17.5)
Hemoglobin: 14.8 (ref 13.5–17.5)
PLATELETS: 125 — AB (ref 150–399)
WBC: 5.9

## 2017-03-03 LAB — VITAMIN D 25 HYDROXY (VIT D DEFICIENCY, FRACTURES): Vit D, 25-Hydroxy: 39.9

## 2017-03-13 ENCOUNTER — Emergency Department (HOSPITAL_COMMUNITY)
Admission: EM | Admit: 2017-03-13 | Discharge: 2017-03-13 | Disposition: A | Discharge status: Home / Self Care | Payer: Medicare Other | Attending: Emergency Medicine | Admitting: Emergency Medicine

## 2017-03-13 ENCOUNTER — Emergency Department (HOSPITAL_COMMUNITY): Payer: Medicare Other

## 2017-03-13 ENCOUNTER — Encounter (HOSPITAL_COMMUNITY): Payer: Self-pay | Admitting: Emergency Medicine

## 2017-03-13 DIAGNOSIS — Z7982 Long term (current) use of aspirin: Secondary | ICD-10-CM | POA: Diagnosis not present

## 2017-03-13 DIAGNOSIS — Z7901 Long term (current) use of anticoagulants: Secondary | ICD-10-CM | POA: Insufficient documentation

## 2017-03-13 DIAGNOSIS — Z87891 Personal history of nicotine dependence: Secondary | ICD-10-CM | POA: Insufficient documentation

## 2017-03-13 DIAGNOSIS — I2510 Atherosclerotic heart disease of native coronary artery without angina pectoris: Secondary | ICD-10-CM | POA: Insufficient documentation

## 2017-03-13 DIAGNOSIS — I10 Essential (primary) hypertension: Secondary | ICD-10-CM | POA: Insufficient documentation

## 2017-03-13 DIAGNOSIS — J45909 Unspecified asthma, uncomplicated: Secondary | ICD-10-CM | POA: Insufficient documentation

## 2017-03-13 DIAGNOSIS — Z79899 Other long term (current) drug therapy: Secondary | ICD-10-CM | POA: Diagnosis not present

## 2017-03-13 DIAGNOSIS — R079 Chest pain, unspecified: Secondary | ICD-10-CM | POA: Diagnosis not present

## 2017-03-13 DIAGNOSIS — E039 Hypothyroidism, unspecified: Secondary | ICD-10-CM | POA: Insufficient documentation

## 2017-03-13 DIAGNOSIS — Z9049 Acquired absence of other specified parts of digestive tract: Secondary | ICD-10-CM | POA: Insufficient documentation

## 2017-03-13 DIAGNOSIS — Z8546 Personal history of malignant neoplasm of prostate: Secondary | ICD-10-CM | POA: Insufficient documentation

## 2017-03-13 DIAGNOSIS — F419 Anxiety disorder, unspecified: Secondary | ICD-10-CM | POA: Diagnosis not present

## 2017-03-13 DIAGNOSIS — Z8673 Personal history of transient ischemic attack (TIA), and cerebral infarction without residual deficits: Secondary | ICD-10-CM | POA: Insufficient documentation

## 2017-03-13 DIAGNOSIS — I480 Paroxysmal atrial fibrillation: Secondary | ICD-10-CM | POA: Diagnosis not present

## 2017-03-13 HISTORY — DX: Disorder of thyroid, unspecified: E07.9

## 2017-03-13 LAB — BASIC METABOLIC PANEL
ANION GAP: 8 (ref 5–15)
BUN: 19 mg/dL (ref 6–20)
CALCIUM: 9.1 mg/dL (ref 8.9–10.3)
CO2: 24 mmol/L (ref 22–32)
Chloride: 106 mmol/L (ref 101–111)
Creatinine, Ser: 1.76 mg/dL — ABNORMAL HIGH (ref 0.61–1.24)
GFR calc non Af Amer: 35 mL/min — ABNORMAL LOW (ref >60)
GFR, EST AFRICAN AMERICAN: 40 mL/min — AB (ref >60)
Glucose, Bld: 119 mg/dL — ABNORMAL HIGH (ref 65–99)
Potassium: 4.4 mmol/L (ref 3.5–5.1)
Sodium: 138 mmol/L (ref 135–145)

## 2017-03-13 LAB — I-STAT TROPONIN, ED: Troponin i, poc: 0.01 ng/mL (ref 0.00–0.08)

## 2017-03-13 LAB — CBC
HCT: 38.1 % — ABNORMAL LOW (ref 39.0–52.0)
HEMOGLOBIN: 13.2 g/dL (ref 13.0–17.0)
MCH: 34.1 pg — AB (ref 26.0–34.0)
MCHC: 34.6 g/dL (ref 30.0–36.0)
MCV: 98.4 fL (ref 78.0–100.0)
Platelets: 110 10*3/uL — ABNORMAL LOW (ref 150–400)
RBC: 3.87 MIL/uL — AB (ref 4.22–5.81)
RDW: 14 % (ref 11.5–15.5)
WBC: 5 10*3/uL (ref 4.0–10.5)

## 2017-03-13 MED ORDER — RIVAROXABAN (XARELTO) EDUCATION KIT FOR AFIB PATIENTS
PACK | Freq: Once | Status: AC
  Administered 2017-03-13: 06:00:00
  Filled 2017-03-13: qty 1

## 2017-03-13 MED ORDER — RIVAROXABAN 15 MG PO TABS
15.0000 mg | ORAL_TABLET | Freq: Once | ORAL | Status: AC
Start: 2017-03-13 — End: 2017-03-13
  Administered 2017-03-13: 15 mg via ORAL
  Filled 2017-03-13 (×2): qty 1

## 2017-03-13 MED ORDER — RIVAROXABAN (XARELTO) VTE STARTER PACK (15 & 20 MG): ORAL_TABLET | ORAL | 0 refills | Status: DC

## 2017-03-13 NOTE — ED Triage Notes (Signed)
Pt states he woke up @ 0200 with heavy chest pain radiating down L arm, pt took ASA and nitro with no relief. Denies n/v, denies diaphoresis, denies shob. Pt states enroute to ED pt states pain subsided.

## 2017-03-13 NOTE — ED Triage Notes (Signed)
Pt BIB EMS from home. Awoke with CP approx 0220 this morning. Pain mostly in L chest that radiates to R chest, L arm. Denies N/V, SOB, diaphoresis, edema. Took 81mg  ASA and 1 nitro at home with no relief. For EMS, pt found to be in Afib w/RVR. HR 110s-150s and appears to have no prior hx of same. Given 243mg  ASA and 3 nitro by EMS. Pt converted en route to SB in the 50s and no CP.

## 2017-03-13 NOTE — ED Provider Notes (Signed)
[place] DEPT Provider Note   CSN: [id number] Arrival date & time: 03/13/17  0353     History   Chief Complaint Chief Complaint  Patient presents with  . Chest Pain    HPI [name] is a 83 y.o. male.  Signs for evaluation of chest discomfort felt as a pressure-like sensation in the bilateral precordial region.  He noticed that when he awoke from sleep about 2 AM today.  He subsequently took nitroglycerin spray 1 and baby aspirin 1 without relief.  He called EMS who transported him here.  During transport he had rapid rates, irregular, stated to be between 110 and 150.  He had a sudden change during transport when he felt the discomfort stopped immediately and he was then told that he was "out of atrial fibrillation."  No prior episodes of atrial fibrillation.  Stable cardiac status since cardiac stenting, 2 years ago.  Reported regular follow-up with his cardiologist, every year.  He denies recent fever, chills, cough, weakness or dizziness.  He has hypothyroid and recently went to an integrative [facility] and was put on multiple "supplements."  He has been taking these for about 1 week.  There are no other known modifying factors.  HPI  Past Medical History:  Diagnosis Date  . Anginal pain ([place])   . Anxiety    " OCCASIONAL"  . Arthritis   . Asthma due to seasonal allergies    uses inhalers prn  . Carotid artery occlusion    Left  . Complication of anesthesia    " had tremors after prostate surgery  . Coronary artery disease   . Diverticulitis    recurrent  . Family history of anesthesia complication    MOTHER   . GERD (gastroesophageal reflux disease)   . H/O hiatal hernia   . Hearing aid worn   . Hyperlipemia   . Hypertension   . Kidney stone    lithotripsy 0347 w complications, req stents, Dr Rosana Hoes  . Prostate CA ([place])   . Shortness of breath   . Thyroid disease   . TIA (transient ischemic attack)   . Tinnitus     Patient Active Problem List   Diagnosis Date Noted  . Angina pectoris ([place]) 10/09/2015  . Coronary artery disease   . Hypertension   . Hyperlipemia   . Carotid artery occlusion   . Cerumen impaction 08/30/2015  . Dizziness 08/09/2015  . Gait instability 05/30/2015  . Myalgia   . Weakness   . Bloating   . Chest pain at rest 03/24/2015  . Atypical chest pain 03/24/2015  . Fatigue 03/14/2015  . Hypothyroidism 10/26/2013  . OSA (obstructive sleep apnea) 10/18/2013  . Well adult health check 01/02/2013  . Other symptoms involving urinary system(788.99) 12/21/2012  . Benign localized hyperplasia of prostate with urinary obstruction and other lower urinary tract symptoms (LUTS)(600.21) 12/21/2012  . Carcinoma in situ of prostate 12/21/2012  . Left shoulder pain 08/08/2012  . Obesity 07/24/2011  . Generalized anxiety disorder 07/24/2011  . DIVERTICULOSIS OF COLON 12/25/2009  . DERMATITIS, SEBORRHEIC 12/25/2009  . 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 Memory loss 08/14/2009  . BACK PAIN, LUMBAR 07/19/2009  . PROSTATE CANCER 09/09/2006  . HYPERLIPIDEMIA 09/09/2006  . HYPERTENSION, BENIGN SYSTEMIC 09/09/2006  . Coronary atherosclerosis 09/09/2006  . VENOUS INSUFFICIENCY, CHRONIC 09/09/2006  . GASTROESOPHAGEAL REFLUX, NO ESOPHAGITIS 09/09/2006  . HERNIA, HIATAL, NONCONGENITAL 09/09/2006  . INSOMNIA NOS 09/09/2006    Past Surgical History:  Procedure Laterality Date  .  CARDIAC CATHETERIZATION  2008   minimal dz, Dr Cathie Olden  . CARDIAC CATHETERIZATION N/A 10/09/2015   Procedure: Left Heart Cath and Coronary Angiography;  Surgeon: Peter M Martinique, MD;  Location: [place] CV LAB;  Service: Cardiovascular;  Laterality: N/A;  . CARDIAC CATHETERIZATION N/A 10/09/2015   Procedure: Intravascular Pressure Wire/FFR Study;  Surgeon: Peter M Martinique, MD;  Location: [place] [facility];  Service: Cardiovascular;  Laterality: N/A;  . CARDIAC CATHETERIZATION N/A 10/09/2015   Procedure: Coronary Stent Intervention;  Surgeon:  Peter M Martinique, MD;  Location: [place] [facility];  Service: Cardiovascular;  Laterality: N/A;  . CAROTID ENDARTERECTOMY  Left   1998  . CHOLECYSTECTOMY    . CORONARY STENT PLACEMENT  10/09/2015   DES to RCA  . LEFT HEART CATHETERIZATION WITH CORONARY ANGIOGRAM N/A 09/15/2013   Procedure: LEFT HEART CATHETERIZATION WITH CORONARY ANGIOGRAM;  Surgeon: Peter M Martinique, MD;  Location: [facility] CATH LAB;  Service: Cardiovascular;  Laterality: N/A;  . PROSTATECTOMY  1998   radical for prostate cancer       Home Medications    Prior to Admission medications   Medication Sig Start Date End Date Taking? Authorizing Provider  Artificial Tear Solution (BION TEARS OP) Place 1 drop into both eyes daily as needed (dry eyes).    [provider]  aspirin 81 MG tablet Take 1 tablet (81 mg total) by mouth daily. 10/10/15   Arbutus Leas, NP  beclomethasone (QVAR) 40 MCG/ACT inhaler Inhale into the lungs. 07/03/15   [provider]  carvedilol (COREG) 6.25 MG tablet TAKE 1 TABLET BY MOUTH TWICE A DAY WITH A MEAL 02/17/17   Copland, Gay Filler, MD  ezetimibe (ZETIA) 10 MG tablet Take 1 tablet (10 mg total) by mouth daily. 09/08/16   Martinique, Peter M, MD  HYDROcodone-acetaminophen (NORCO/VICODIN) 5-325 MG tablet Take 0.5-1 tablets by mouth 2 (two) times daily as needed (pain). 11/23/16   Copland, Gay Filler, MD  ipratropium (ATROVENT) 0.03 % nasal spray Place 2 sprays into the nose 4 (four) times daily. 05/11/16   Copland, Gay Filler, MD  ketoconazole (NIZORAL) 2 % cream APPLY TO AFFECTED AREA TWICE A DAY 07/31/16   [provider]  levothyroxine (SYNTHROID, LEVOTHROID) 100 MCG tablet TAKE 1 TABLET EVERY DAY IN THE MORNING. 12/29/16   Copland, Gay Filler, MD  liothyronine (CYTOMEL) 5 MCG tablet Take 1 tablet (5 mcg total) by mouth every morning. 09/23/16   Copland, Gay Filler, MD  losartan-hydrochlorothiazide (HYZAAR) 100-12.5 MG tablet Take 1 tablet by mouth every other day.  01/24/16   [provider]  nitroGLYCERIN (NITROLINGUAL) 0.4 MG/SPRAY spray Place 1 spray under the tongue every 5 (five) minutes x 3 doses as needed for chest pain. 03/18/16   Almyra Deforest, PA  polyethylene glycol (MIRALAX / GLYCOLAX) packet Take 17 g by mouth daily. Mix in 8 oz liquid and drink    [provider]  Rivaroxaban 15 & 20 MG TBPK Take as directed on package: Start with one 99m tablet by mouth twice a day with food. On Day 22, switch to one 21mtablet once a day with food. 03/13/17   WeDaleen BoMD  triamcinolone cream (KENALOG) 0.1 % Apply 1 application topically daily as needed (rash).    [provider]  VENTOLIN HFA 108 (90 Base) MCG/ACT inhaler INHALE 2 PUFFS INTO THE LUNGS EVERY 6 HRS AS NEEDED FOR WHEEZING OR SHORTNESS OF BREATH 10/23/16   Copland, JeGay FillerMD  Family History Family History  Problem Relation Age of Onset  . Heart disease Mother   . Heart disease Father   . Kidney failure Father   . Heart disease Sister   . Heart attack Sister   . Dementia Sister   . Sjogren's syndrome Child   . Hashimoto's thyroiditis Child   . CAD Child     Social History Social History  Substance Use Topics  . Smoking status: Former Smoker    Packs/day: 2.50    Years: 30.00    Types: Cigarettes    Quit date: 08/28/1985  . Smokeless tobacco: Never Used  . Alcohol use 1.2 oz/week    2 Standard drinks or equivalent per week     Comment: 1 glass wine/week (prior 1 glass martini with dinner)     Allergies   Ciprofloxacin; Codeine; Flexeril [cyclobenzaprine]; Methocarbamol; Sulfa antibiotics; Sulfamethoxazole; Sulfonamide derivatives; and Flagyl [metronidazole]   Review of Systems Review of Systems  All other systems reviewed and are negative.    Physical Exam Updated Vital Signs BP (!) 123/59   Pulse (!) 52   Temp 98.4 F (36.9 C) (Oral)   Resp 12   Ht 5' 7"  (1.702 m)   Wt 98.9 kg (218 lb)   SpO2 97%   BMI 34.14 kg/m   Physical Exam  Constitutional:  He is oriented to person, place, and time. He appears well-developed.  Elderly, obese  HENT:  Head: Normocephalic and atraumatic.  Right Ear: External ear normal.  Left Ear: External ear normal.  Eyes: Pupils are equal, round, and reactive to light. Conjunctivae and EOM are normal.  Neck: Normal range of motion and phonation normal. Neck supple.  Cardiovascular: Normal rate, regular rhythm and normal heart sounds.   Pulmonary/Chest: Effort normal and breath sounds normal. He exhibits no bony tenderness.  Abdominal: Soft. He exhibits no distension. There is no tenderness.  Musculoskeletal: Normal range of motion. He exhibits edema. He exhibits no tenderness.  1+ pretibial edema bilaterally  Neurological: He is alert and oriented to person, place, and time. No cranial nerve deficit or sensory deficit. He exhibits normal muscle tone. Coordination normal.  Skin: Skin is warm, dry and intact.  Psychiatric: He has a normal mood and affect. His behavior is normal. Judgment and thought content normal.  Nursing note and vitals reviewed.    ED Treatments / Results  Labs (all labs ordered are listed, but only abnormal results are displayed) Labs Reviewed  BASIC METABOLIC PANEL - Abnormal; Notable for the following:       Result Value   Glucose, Bld 119 (*)    Creatinine, Ser 1.76 (*)    GFR calc non Af Amer 35 (*)    GFR calc Af Amer 40 (*)    All other components within normal limits  CBC - Abnormal; Notable for the following:    RBC 3.87 (*)    HCT 38.1 (*)    MCH 34.1 (*)    Platelets 110 (*)    All other components within normal limits  I-STAT TROPONIN, ED    EKG  EKG Interpretation  Date/Time:  Saturday March 13 2017 03:59:55 EDT Ventricular Rate:  58 PR Interval:    QRS Duration: 132 QT Interval:  438 QTC Calculation: 431 R Axis:   68 Text Interpretation:  Sinus rhythm Right bundle branch block since last tracing no significant change Confirmed by Daleen Bo  [phone]) on 03/13/2017 4:24:30 AM       This patients CHA2DS2-VASc  Score and unadjusted Ischemic Stroke Rate (% per year) is equal to 4.8 % stroke rate/year from a score of 4  Above score calculated as 1 point each if present [CHF, HTN, DM, Vascular=MI/PAD/Aortic Plaque, Age if 65-74, or Male] Above score calculated as 2 points each if present [Age > 75, or Stroke/TIA/TE]                                        Radiology Dg Chest 2 View  Result Date: 03/13/2017 CLINICAL DATA:  83 y/o  M; chest pain. EXAM: CHEST  2 VIEW COMPARISON:  03/24/2015 chest radiographs FINDINGS: Normal cardiac silhouette. Aortic atherosclerosis with calcification. Stable coarse lung markings in lung bases, likely chronic bronchitic changes No pleural effusion or pneumothorax. Moderate multilevel degenerative changes of the spine. IMPRESSION: Stable chronic bronchitic changes in lung bases. No focal consolidation. Aortic atherosclerosis. Electronically Signed   By: Kristine Garbe M.D.   On: 03/13/2017 05:18    Procedures Procedures (including critical care time)  Medications Ordered in ED Medications  Rivaroxaban (XARELTO) tablet 15 mg (15 mg Oral Given 03/13/17 0631)  rivaroxaban Alveda Reasons) Education Kit for Afib patients ( Does not apply Given 03/13/17 0622)     Initial Impression / Assessment and Plan / ED Course  I have reviewed the triage vital signs and the nursing notes.  Pertinent labs & imaging results that were available during my care of the patient were reviewed by me and considered in my medical decision making (see chart for details).      Patient Vitals for the past 24 hrs:  BP Temp Temp src Pulse Resp SpO2 Height Weight  03/13/17 0630 (!) 123/59 - - (!) 52 12 97 % - -  03/13/17 0600 (!) 143/66 - - (!) 51 13 97 % - -  03/13/17 0515 114/60 - - (!) 53 (!) 21 98 % - -  03/13/17 0500 (!) 118/58 - - (!) 57 20 97 % - -  03/13/17 0430 114/65 - - 68 19 97 %  - -  03/13/17 0415 131/70 - - 62 15 97 % - -  03/13/17 0405 - - - - - 98 % 5' 7"  (1.702 m) 98.9 kg (218 lb)  03/13/17 0403 137/66 98.4 F (36.9 C) Oral 60 - 98 % - -  03/13/17 0400 137/66 - - 60 17 98 % - -    6:41 AM - reevaluation with update and discussion. After initial assessment and treatment, an updated evaluation reveals he remains comfortable and has no further complaints. Tiffani Kadow L     Final Clinical Impressions(s) / ED Diagnoses   Final diagnoses:  Paroxysmal atrial fibrillation (HCC)   Apparent paroxysmal atrial fibrillation, spontaneously resolved during transport.  Doubt ACS, PE, metabolic instability or impending vascular collapse.  Since the patient was then and out of atrial fibrillation, he can be safely treated as an outpatient.  Chads score is elevated. he will be started on anticoagulant medication referred to the atrial fibrillation clinic.  Nursing Notes Reviewed/ Care Coordinated Applicable Imaging Reviewed Interpretation of Laboratory Data incorporated into ED treatment  The patient appears reasonably screened and/or stabilized for discharge and I doubt any other medical condition or other [facility] requiring further screening, evaluation, or treatment in the ED at this time prior to discharge.  Plan: Home Medications-continue usual medications; Home Treatments-rest, fluids; return here if the recommended treatment, does not  improve the symptoms; Recommended follow up-A. fib clinic as soon as possible for further evaluation and treatment.  PCP and cardiology as needed.   New Prescriptions New Prescriptions   RIVAROXABAN 15 & 20 MG TBPK    Take as directed on package: Start with one 20m tablet by mouth twice a day with food. On Day 22, switch to one 228mtablet once a day with food.     WeDaleen BoMD 03/13/17 [phone]

## 2017-03-13 NOTE — ED Notes (Signed)
Pharmacist at bedside with Xarelto afib kit

## 2017-03-13 NOTE — ED Notes (Signed)
Provider at bedside

## 2017-03-13 NOTE — ED Notes (Signed)
Pt to xray via stretcher

## 2017-03-13 NOTE — Discharge Instructions (Signed)
It appears that your heart was in a rhythm called atrial fibrillation for a short period of time, this morning.  Luckily it spontaneously converted to sinus rhythm.  We are starting on a blood thinner called Xarelto, to help prevent complications if the atrial fibrillation should recur.  It is important to follow-up with the atrial fibrillation clinic, as soon as possible, and with your primary care provider in a week or so.  Return here, if needed, for problems.

## 2017-03-16 NOTE — Progress Notes (Signed)
[place] [facility] [address] [place], [place] [address] [phone]  Date:  03/18/2017   Name:  [name]   DOB:  1933/12/10   MRN:  [id number]  PCP:  [name], MD    Chief Complaint: Follow-up (Pt here to ER follow up )   History of Present Illness:  [name] is a 83 y.o. very pleasant male patient who presents with the following:  He was in the ER with CP on 9/1 and is here for a follow-up visit.  Chest pain and palpiation sx woke him up at 0200 while at home.  He tried nitro, aspirin- these did not help so they called 911.  Per EMS he was in a fib-.  However he converted to SR during transport  He was evaluated and released to home with anticoagulation by the ER  He has a history of CAD and is a cardiology pt of Dr. Martinique.  Pt and his wife are not aware of his being in a fib previously and I do not see mention of his being in a fib in the past  He had been taking some supplements recently from an integrative health clinic- he has a list of about 8 things he has been taking  They did start him on xarelto in the ER- he is taking but notes easy bruising  His saw a naturopath in [place] and was started on 9 supplements- he is not really sure what these are for but thinks at least some are for his thyroid.   He stopped taking these  He brings in some recent labs from this visit for me today.  His TSH was high but other labs appear to be ok  Here today with his wife who helps to give the history  [name] also has some questions about his sleep.  Notes that he generally goes to sleep ok, will wake up about 3 hours later. He will eat a snack and "then will feel so tired I am basically in a trance," and will go back to sleep until the morning.    He is not sure if this is ok  Lab Results  Component Value Date   TSH 1.81 11/23/2016   Today he feels fine, no CP or SOB noted  His creat and GFR have been gradually  worsening for the last couple of years.  Discussed with pt and his wife  Pulse Readings from Last 3 Encounters:  03/18/17 (!) 59  03/13/17 (!) 52  11/30/16 61   BP Readings from Last 3 Encounters:  03/18/17 (!) 143/58  03/13/17 (!) 123/59  11/30/16 (!) 152/72     Patient Active Problem List   Diagnosis Date Noted  . Angina pectoris ([place]) 10/09/2015  . Coronary artery disease   . Hypertension   . Hyperlipemia   . Carotid artery occlusion   . Dizziness 08/09/2015  . Gait instability 05/30/2015  . Myalgia   . Weakness   . Bloating   . Chest pain at rest 03/24/2015  . Atypical chest pain 03/24/2015  . Fatigue 03/14/2015  . Hypothyroidism 10/26/2013  . OSA (obstructive sleep apnea) 10/18/2013  . Well adult health check 01/02/2013  . Other symptoms involving urinary system(788.99) 12/21/2012  . Benign localized hyperplasia of prostate with urinary obstruction and other lower urinary tract symptoms (LUTS)(600.21) 12/21/2012  . Carcinoma in situ of prostate 12/21/2012  . Left shoulder pain  08/08/2012  . Obesity 07/24/2011  . Generalized anxiety disorder 07/24/2011  . DIVERTICULOSIS OF COLON 12/25/2009  . DERMATITIS, SEBORRHEIC 12/25/2009  . 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 Memory loss 08/14/2009  . BACK PAIN, LUMBAR 07/19/2009  . HYPERLIPIDEMIA 09/09/2006  . HYPERTENSION, BENIGN SYSTEMIC 09/09/2006  . Coronary atherosclerosis 09/09/2006  . VENOUS INSUFFICIENCY, CHRONIC 09/09/2006  . GASTROESOPHAGEAL REFLUX, NO ESOPHAGITIS 09/09/2006  . HERNIA, HIATAL, NONCONGENITAL 09/09/2006  . INSOMNIA NOS 09/09/2006    Past Medical History:  Diagnosis Date  . Anginal pain ([place])   . Anxiety    " OCCASIONAL"  . Arthritis   . Asthma due to seasonal allergies    uses inhalers prn  . Carotid artery occlusion    Left  . Complication of anesthesia    " had tremors after prostate surgery  . Coronary artery disease   . Diverticulitis    recurrent  . Family history of  anesthesia complication    MOTHER   . GERD (gastroesophageal reflux disease)   . H/O hiatal hernia   . Hearing aid worn   . Hyperlipemia   . Hypertension   . Kidney stone    lithotripsy 6606 w complications, req stents, Dr Rosana Hoes  . Prostate CA ([place])   . Shortness of breath   . Thyroid disease   . TIA (transient ischemic attack)   . Tinnitus     Past Surgical History:  Procedure Laterality Date  . CARDIAC CATHETERIZATION  2008   minimal dz, Dr Cathie Olden  . CARDIAC CATHETERIZATION N/A 10/09/2015   Procedure: Left Heart Cath and Coronary Angiography;  Surgeon: Peter M Martinique, MD;  Location: [facility];  Service: Cardiovascular;  Laterality: N/A;  . CARDIAC CATHETERIZATION N/A 10/09/2015   Procedure: Intravascular Pressure Wire/FFR Study;  Surgeon: Peter M Martinique, MD;  Location: [facility];  Service: Cardiovascular;  Laterality: N/A;  . CARDIAC CATHETERIZATION N/A 10/09/2015   Procedure: Coronary Stent Intervention;  Surgeon: Peter M Martinique, MD;  Location: [facility];  Service: Cardiovascular;  Laterality: N/A;  . CAROTID ENDARTERECTOMY  Left   1998  . CHOLECYSTECTOMY    . CORONARY STENT PLACEMENT  10/09/2015   DES to RCA  . LEFT HEART CATHETERIZATION WITH CORONARY ANGIOGRAM N/A 09/15/2013   Procedure: LEFT HEART CATHETERIZATION WITH CORONARY ANGIOGRAM;  Surgeon: Peter M Martinique, MD;  Location[facility];  Service: Cardiovascular;  Laterality: N/A;  . PROSTATECTOMY  1998   radical for prostate cancer    Social History  Substance Use Topics  . Smoking status: Former Smoker    Packs/day: 2.50    Years: 30.00    Types: Cigarettes    Quit date: 08/28/1985  . Smokeless tobacco: Never Used  . Alcohol use 1.2 oz/week    2 Standard drinks or equivalent per week     Comment: 1 glass wine/week (prior 1 glass martini with dinner)    Family History  Problem Relation Age of Onset  . Heart disease Mother   . Heart disease Father   . Kidney failure Father   . Heart  disease Sister   . Heart attack Sister   . Dementia Sister   . Sjogren's syndrome Child   . Hashimoto's thyroiditis Child   . CAD Child     Allergies  Allergen Reactions  . Ciprofloxacin Other (See Comments)    Hallucinations or jittery Hallucinations or jittery  . Codeine Other (See Comments)    hallucinations hallucinations  . Flexeril [Cyclobenzaprine]   .  Methocarbamol   . Sulfa Antibiotics Other (See Comments)    Chills and shaking "serum sickness" Chills and shaking "serum sickness"  . Sulfamethoxazole Other (See Comments)    UNKNOWN REACTION, NOT DOCUMENTED  . Sulfonamide Derivatives Other (See Comments)    Chills and shaking "serum sickness"  . Flagyl [Metronidazole] Rash    Medication list has been reviewed and updated.  Current Outpatient Prescriptions on File Prior to Visit  Medication Sig Dispense Refill  . Artificial Tear Solution (BION TEARS OP) Place 1 drop into both eyes daily as needed (dry eyes).    Marland Kitchen aspirin 81 MG tablet Take 1 tablet (81 mg total) by mouth daily. 30 tablet 12  . beclomethasone (QVAR) 40 MCG/ACT inhaler Inhale into the lungs.    . carvedilol (COREG) 6.25 MG tablet TAKE 1 TABLET BY MOUTH TWICE A DAY WITH A MEAL 180 tablet 3  . ezetimibe (ZETIA) 10 MG tablet Take 1 tablet (10 mg total) by mouth daily. 90 tablet 3  . HYDROcodone-acetaminophen (NORCO/VICODIN) 5-325 MG tablet Take 0.5-1 tablets by mouth 2 (two) times daily as needed (pain). 60 tablet 0  . ipratropium (ATROVENT) 0.03 % nasal spray Place 2 sprays into the nose 4 (four) times daily. 30 mL 6  . ketoconazole (NIZORAL) 2 % cream APPLY TO AFFECTED AREA TWICE A DAY  2  . levothyroxine (SYNTHROID, LEVOTHROID) 100 MCG tablet TAKE 1 TABLET EVERY DAY IN THE MORNING. 90 tablet 1  . liothyronine (CYTOMEL) 5 MCG tablet Take 1 tablet (5 mcg total) by mouth every morning. 90 tablet 3  . losartan-hydrochlorothiazide (HYZAAR) 100-12.5 MG tablet Take 1 tablet by mouth every other day.   3  .  nitroGLYCERIN (NITROLINGUAL) 0.4 MG/SPRAY spray Place 1 spray under the tongue every 5 (five) minutes x 3 doses as needed for chest pain. 12 g 12  . polyethylene glycol (MIRALAX / GLYCOLAX) packet Take 17 g by mouth daily. Mix in 8 oz liquid and drink    . Rivaroxaban 15 & 20 MG TBPK Take as directed on package: Start with one 15mg  tablet by mouth twice a day with food. On Day 22, switch to one 20mg  tablet once a day with food. 51 each 0  . triamcinolone cream (KENALOG) 0.1 % Apply 1 application topically daily as needed (rash).    . VENTOLIN HFA 108 (90 Base) MCG/ACT inhaler INHALE 2 PUFFS INTO THE LUNGS EVERY 6 HRS AS NEEDED FOR WHEEZING OR SHORTNESS OF BREATH 18 Inhaler 0   No current facility-administered medications on file prior to visit.     Review of Systems:  As per HPI- otherwise negative.   Physical Examination: Vitals:   03/18/17 1357  BP: (!) 143/58  Pulse: (!) 59  Resp: 16  Temp: 97.6 F (36.4 C)  SpO2: 97%   Vitals:   03/18/17 1357  Weight: 221 lb (100.2 kg)   Body mass index is 34.61 kg/m. Ideal Body Weight:    GEN: WDWN, NAD, Non-toxic, A & O x 3, overweight, he is looking well today  HEENT: Atraumatic, Normocephalic. Neck supple. No masses, No LAD. Ears and Nose: No external deformity. CV: RRR, No M/G/R. No JVD. No thrill. No extra heart sounds. PULM: CTA B, no wheezes, crackles, rhonchi. No retractions. No resp. distress. No accessory muscle use. ABD: S, NT, ND, +BS. No rebound. No HSM. EXTR: No c/c. Stable, mild edema of both LE  NEURO Normal gait.  PSYCH: Normally interactive. Conversant. Not depressed or anxious appearing.  Calm demeanor.  Assessment and Plan: Paroxysmal atrial fibrillation ([place])  Hypothyroidism due to acquired atrophy of thyroid - Plan: TSH  Chronic renal impairment, unspecified CKD stage - Plan: Ambulatory referral to Nephrology  Here today to follow-up from a recent EMS call/ ER eval where he was found to be in a fib,  spontaneously converted back to sinus.  He is now feeling fine, is taking his anticoagulation.  We wonder if he went into a fib due to some of the supplements he has been taking; ?could have upset his thyroid balance. Encouraged him to stop these supplements  Will check his TSH today.  He has some "alternative" beliefs about thyroid management from his reading of the book "Thyroid Madness."    Also, his renal function is gradually worsening over the last 2 years.   Will refer to nephrology for a consultation.  They may wish to stop his diuretic if ok with cardiology  Signed Lamar Blinks, MD

## 2017-03-18 ENCOUNTER — Ambulatory Visit (INDEPENDENT_AMBULATORY_CARE_PROVIDER_SITE_OTHER): Payer: Medicare Other | Admitting: Family Medicine

## 2017-03-18 ENCOUNTER — Encounter: Payer: Self-pay | Admitting: Family Medicine

## 2017-03-18 VITALS — BP 143/58 | HR 59 | Temp 97.6°F | Resp 16 | Wt 221.0 lb

## 2017-03-18 DIAGNOSIS — N189 Chronic kidney disease, unspecified: Secondary | ICD-10-CM

## 2017-03-18 DIAGNOSIS — I480 Paroxysmal atrial fibrillation: Secondary | ICD-10-CM

## 2017-03-18 DIAGNOSIS — E034 Atrophy of thyroid (acquired): Secondary | ICD-10-CM | POA: Diagnosis not present

## 2017-03-18 NOTE — Patient Instructions (Addendum)
I am going to ask Dr. Doug Sou office to contact you for a follow-up appt We will also refer you to see a nephrologist to discuss your kidney function- they may want to tweak your medication to help preserve your kidneys and keep them healthy  Please let us know if you have any other episodes with your heart!

## 2017-03-19 ENCOUNTER — Telehealth: Payer: Self-pay

## 2017-03-19 NOTE — Telephone Encounter (Signed)
Spoke to patient Dr.Jordan advised needs f/u ER visit scheduled.Appointment scheduled with Almyra Deforest PA 04/07/17 at 9:30 am.

## 2017-03-22 ENCOUNTER — Telehealth (HOSPITAL_COMMUNITY): Payer: Self-pay | Admitting: *Deleted

## 2017-03-22 NOTE — Telephone Encounter (Signed)
I cld pt to make sure all appts were scheduled as needed.  Pt was originally in ED early this month.  Pt was prescribed DVT dose pack of xarelto.  I informed pt of afib protocol, taking 20 mg once a day.  Pt will stop the 15 mg twice a day and start 20 mg once daily tomorrow.  Pt understood he would continue 20 mg once a day and keep cardiology appt 09/26.

## 2017-03-24 ENCOUNTER — Observation Stay (HOSPITAL_BASED_OUTPATIENT_CLINIC_OR_DEPARTMENT_OTHER)
Admission: EM | Admit: 2017-03-24 | Discharge: 2017-03-26 | Disposition: A | Discharge status: Home / Self Care | Payer: Medicare Other | Attending: Internal Medicine | Admitting: Internal Medicine

## 2017-03-24 ENCOUNTER — Encounter: Payer: Self-pay | Admitting: Medical

## 2017-03-24 ENCOUNTER — Encounter: Payer: Self-pay | Admitting: Family Medicine

## 2017-03-24 ENCOUNTER — Ambulatory Visit (INDEPENDENT_AMBULATORY_CARE_PROVIDER_SITE_OTHER): Payer: Medicare Other | Admitting: Medical

## 2017-03-24 ENCOUNTER — Encounter (HOSPITAL_BASED_OUTPATIENT_CLINIC_OR_DEPARTMENT_OTHER): Payer: Self-pay

## 2017-03-24 VITALS — BP 139/87 | HR 60 | Temp 97.8°F | Resp 16 | Ht 66.0 in | Wt 221.8 lb

## 2017-03-24 DIAGNOSIS — G4733 Obstructive sleep apnea (adult) (pediatric): Secondary | ICD-10-CM | POA: Insufficient documentation

## 2017-03-24 DIAGNOSIS — Z7982 Long term (current) use of aspirin: Secondary | ICD-10-CM | POA: Diagnosis not present

## 2017-03-24 DIAGNOSIS — I2510 Atherosclerotic heart disease of native coronary artery without angina pectoris: Secondary | ICD-10-CM | POA: Diagnosis present

## 2017-03-24 DIAGNOSIS — Z882 Allergy status to sulfonamides status: Secondary | ICD-10-CM | POA: Insufficient documentation

## 2017-03-24 DIAGNOSIS — E890 Postprocedural hypothyroidism: Secondary | ICD-10-CM | POA: Diagnosis not present

## 2017-03-24 DIAGNOSIS — K921 Melena: Principal | ICD-10-CM | POA: Insufficient documentation

## 2017-03-24 DIAGNOSIS — I2583 Coronary atherosclerosis due to lipid rich plaque: Secondary | ICD-10-CM | POA: Diagnosis not present

## 2017-03-24 DIAGNOSIS — E785 Hyperlipidemia, unspecified: Secondary | ICD-10-CM | POA: Insufficient documentation

## 2017-03-24 DIAGNOSIS — Z881 Allergy status to other antibiotic agents status: Secondary | ICD-10-CM | POA: Diagnosis not present

## 2017-03-24 DIAGNOSIS — I480 Paroxysmal atrial fibrillation: Secondary | ICD-10-CM | POA: Diagnosis present

## 2017-03-24 DIAGNOSIS — Z955 Presence of coronary angioplasty implant and graft: Secondary | ICD-10-CM | POA: Insufficient documentation

## 2017-03-24 DIAGNOSIS — Z7901 Long term (current) use of anticoagulants: Secondary | ICD-10-CM | POA: Insufficient documentation

## 2017-03-24 DIAGNOSIS — Z79899 Other long term (current) drug therapy: Secondary | ICD-10-CM | POA: Diagnosis not present

## 2017-03-24 DIAGNOSIS — J45909 Unspecified asthma, uncomplicated: Secondary | ICD-10-CM | POA: Insufficient documentation

## 2017-03-24 DIAGNOSIS — Z888 Allergy status to other drugs, medicaments and biological substances status: Secondary | ICD-10-CM | POA: Diagnosis not present

## 2017-03-24 DIAGNOSIS — K219 Gastro-esophageal reflux disease without esophagitis: Secondary | ICD-10-CM | POA: Diagnosis not present

## 2017-03-24 DIAGNOSIS — I10 Essential (primary) hypertension: Secondary | ICD-10-CM | POA: Diagnosis not present

## 2017-03-24 DIAGNOSIS — N1832 Chronic kidney disease, stage 3b: Secondary | ICD-10-CM | POA: Diagnosis present

## 2017-03-24 DIAGNOSIS — Z8546 Personal history of malignant neoplasm of prostate: Secondary | ICD-10-CM | POA: Diagnosis not present

## 2017-03-24 DIAGNOSIS — D62 Acute posthemorrhagic anemia: Secondary | ICD-10-CM

## 2017-03-24 DIAGNOSIS — R1012 Left upper quadrant pain: Secondary | ICD-10-CM | POA: Diagnosis not present

## 2017-03-24 DIAGNOSIS — N183 Chronic kidney disease, stage 3 (moderate): Secondary | ICD-10-CM | POA: Diagnosis not present

## 2017-03-24 DIAGNOSIS — I129 Hypertensive chronic kidney disease with stage 1 through stage 4 chronic kidney disease, or unspecified chronic kidney disease: Secondary | ICD-10-CM | POA: Diagnosis not present

## 2017-03-24 DIAGNOSIS — K3189 Other diseases of stomach and duodenum: Secondary | ICD-10-CM | POA: Diagnosis not present

## 2017-03-24 DIAGNOSIS — Z8673 Personal history of transient ischemic attack (TIA), and cerebral infarction without residual deficits: Secondary | ICD-10-CM | POA: Insufficient documentation

## 2017-03-24 DIAGNOSIS — E039 Hypothyroidism, unspecified: Secondary | ICD-10-CM | POA: Diagnosis not present

## 2017-03-24 LAB — CBC WITH DIFFERENTIAL/PLATELET
BASOS ABS: 0.2 10*3/uL — AB (ref 0.0–0.1)
Basophils Relative: 3 %
EOS ABS: 0.8 10*3/uL — AB (ref 0.0–0.7)
Eosinophils Relative: 12 %
HCT: 35.2 % — ABNORMAL LOW (ref 39.0–52.0)
Hemoglobin: 12.5 g/dL — ABNORMAL LOW (ref 13.0–17.0)
LYMPHS PCT: 28 %
Lymphs Abs: 1.8 10*3/uL (ref 0.7–4.0)
MCH: 35.6 pg — ABNORMAL HIGH (ref 26.0–34.0)
MCHC: 35.5 g/dL (ref 30.0–36.0)
MCV: 100.3 fL — ABNORMAL HIGH (ref 78.0–100.0)
MONO ABS: 0.9 10*3/uL (ref 0.1–1.0)
Monocytes Relative: 15 %
NEUTROS PCT: 42 %
Neutro Abs: 2.6 10*3/uL (ref 1.7–7.7)
PLATELETS: 139 10*3/uL — AB (ref 150–400)
RBC: 3.51 MIL/uL — ABNORMAL LOW (ref 4.22–5.81)
RDW: 13.6 % (ref 11.5–15.5)
WBC: 6.3 10*3/uL (ref 4.0–10.5)

## 2017-03-24 LAB — COMPREHENSIVE METABOLIC PANEL
ALBUMIN: 3.9 g/dL (ref 3.5–5.0)
ALK PHOS: 50 U/L (ref 38–126)
ALT: 27 U/L (ref 17–63)
ANION GAP: 7 (ref 5–15)
AST: 26 U/L (ref 15–41)
BILIRUBIN TOTAL: 1.5 mg/dL — AB (ref 0.3–1.2)
BUN: 23 mg/dL — AB (ref 6–20)
CALCIUM: 9.4 mg/dL (ref 8.9–10.3)
CO2: 28 mmol/L (ref 22–32)
Chloride: 103 mmol/L (ref 101–111)
Creatinine, Ser: 1.59 mg/dL — ABNORMAL HIGH (ref 0.61–1.24)
GFR calc Af Amer: 45 mL/min — ABNORMAL LOW (ref >60)
GFR, EST NON AFRICAN AMERICAN: 39 mL/min — AB (ref >60)
GLUCOSE: 102 mg/dL — AB (ref 65–99)
POTASSIUM: 4.8 mmol/L (ref 3.5–5.1)
Sodium: 138 mmol/L (ref 135–145)
TOTAL PROTEIN: 6.8 g/dL (ref 6.5–8.1)

## 2017-03-24 MED ORDER — SODIUM CHLORIDE 0.9 % IV SOLN
INTRAVENOUS | Status: DC
  Administered 2017-03-25 (×2): via INTRAVENOUS
  Administered 2017-03-26: 500 mL via INTRAVENOUS

## 2017-03-24 MED ORDER — LIOTHYRONINE SODIUM 5 MCG PO TABS
5.0000 ug | ORAL_TABLET | Freq: Every morning | ORAL | Status: DC
  Administered 2017-03-25: 5 ug via ORAL
  Filled 2017-03-24 (×2): qty 1

## 2017-03-24 MED ORDER — LOSARTAN POTASSIUM 50 MG PO TABS
100.0000 mg | ORAL_TABLET | Freq: Every day | ORAL | Status: DC
  Administered 2017-03-25 (×2): 100 mg via ORAL
  Filled 2017-03-24 (×2): qty 2

## 2017-03-24 MED ORDER — CARVEDILOL 6.25 MG PO TABS
6.2500 mg | ORAL_TABLET | Freq: Two times a day (BID) | ORAL | Status: DC
  Administered 2017-03-25 (×2): 6.25 mg via ORAL
  Filled 2017-03-24 (×2): qty 1

## 2017-03-24 MED ORDER — PANTOPRAZOLE SODIUM 40 MG IV SOLR
40.0000 mg | Freq: Two times a day (BID) | INTRAVENOUS | Status: DC
  Administered 2017-03-25 (×2): 40 mg via INTRAVENOUS
  Filled 2017-03-24 (×3): qty 40

## 2017-03-24 MED ORDER — ONDANSETRON HCL 4 MG/2ML IJ SOLN: 4.0000 mg | Freq: Four times a day (QID) | INTRAMUSCULAR | Status: DC | PRN

## 2017-03-24 MED ORDER — LEVOTHYROXINE SODIUM 100 MCG PO TABS
100.0000 ug | ORAL_TABLET | Freq: Every day | ORAL | Status: DC
  Administered 2017-03-25: 100 ug via ORAL
  Filled 2017-03-24: qty 1

## 2017-03-24 MED ORDER — ONDANSETRON HCL 4 MG PO TABS: 4.0000 mg | ORAL_TABLET | Freq: Four times a day (QID) | ORAL | Status: DC | PRN

## 2017-03-24 MED ORDER — EZETIMIBE 10 MG PO TABS
10.0000 mg | ORAL_TABLET | Freq: Every day | ORAL | Status: DC
  Administered 2017-03-25: 10 mg via ORAL
  Filled 2017-03-24: qty 1

## 2017-03-24 MED ORDER — POLYETHYLENE GLYCOL 3350 17 G PO PACK: 17.0000 g | PACK | Freq: Every day | ORAL | Status: DC | PRN

## 2017-03-24 MED ORDER — PANTOPRAZOLE SODIUM 40 MG IV SOLR
40.0000 mg | Freq: Once | INTRAVENOUS | Status: AC
  Administered 2017-03-24: 40 mg via INTRAVENOUS
  Filled 2017-03-24: qty 40

## 2017-03-24 NOTE — ED Triage Notes (Signed)
Pt c/o black stools x 2 days-was sent from PCP for concerns due to pt with hx of AFib and taking xarelto-NAD-steady gait

## 2017-03-24 NOTE — ED Provider Notes (Signed)
[facility] DEPT [facility] Provider Note   CSN: [id number] Arrival date & time: 03/24/17  1641     History   Chief Complaint Chief Complaint  Patient presents with  . Melena    HPI [name] is a 83 y.o. male.  HPI  82 year old male presents with 2 black stools. One yesterday, one today. Saw his PCP, sent here for eval of GI bleed. Had a DRE and positive hemoccult by PCP. Describes it as black and looking like tar. Is not on iron. Recently diagnosed with paroxysmal afib and put on Xarelto. Did not take his does this AM. No CP. Had some fleeting pain in left flank/LUQ in the middle of the night, gone since then. No abdominal pain, vomiting or pain with eating. No dizziness/lightheadedness. States he drank ETOH when he was younger but none now. No known liver disease.   Past Medical History:  Diagnosis Date  . Anginal pain ([place])   . Anxiety    " OCCASIONAL"  . Arthritis   . Asthma due to seasonal allergies    uses inhalers prn  . Carotid artery occlusion    Left  . Complication of anesthesia    " had tremors after prostate surgery  . Coronary artery disease   . Diverticulitis    recurrent  . Family history of anesthesia complication    MOTHER   . GERD (gastroesophageal reflux disease)   . H/O hiatal hernia   . Hearing aid worn   . Hyperlipemia   . Hypertension   . Kidney stone    lithotripsy 1884 w complications, req stents, Dr Rosana Hoes  . Prostate CA ([place])   . Shortness of breath   . Thyroid disease   . TIA (transient ischemic attack)   . Tinnitus     Patient Active Problem List   Diagnosis Date Noted  . GI bleeding 03/24/2017  . Angina pectoris ([place]) 10/09/2015  . Coronary artery disease   . Hypertension   . Hyperlipemia   . Carotid artery occlusion   . Dizziness 08/09/2015  . Gait instability 05/30/2015  . Myalgia   . Weakness   . Bloating   . Chest pain at rest 03/24/2015  . Atypical chest pain 03/24/2015  . Fatigue 03/14/2015  . Hypothyroidism  10/26/2013  . OSA (obstructive sleep apnea) 10/18/2013  . Well adult health check 01/02/2013  . Other symptoms involving urinary system(788.99) 12/21/2012  . Benign localized hyperplasia of prostate with urinary obstruction and other lower urinary tract symptoms (LUTS)(600.21) 12/21/2012  . Carcinoma in situ of prostate 12/21/2012  . Left shoulder pain 08/08/2012  . Obesity 07/24/2011  . Generalized anxiety disorder 07/24/2011  . DIVERTICULOSIS OF COLON 12/25/2009  . DERMATITIS, SEBORRHEIC 12/25/2009  . 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 Memory loss 08/14/2009  . BACK PAIN, LUMBAR 07/19/2009  . HYPERLIPIDEMIA 09/09/2006  . HYPERTENSION, BENIGN SYSTEMIC 09/09/2006  . Coronary atherosclerosis 09/09/2006  . VENOUS INSUFFICIENCY, CHRONIC 09/09/2006  . GASTROESOPHAGEAL REFLUX, NO ESOPHAGITIS 09/09/2006  . HERNIA, HIATAL, NONCONGENITAL 09/09/2006  . INSOMNIA NOS 09/09/2006    Past Surgical History:  Procedure Laterality Date  . CARDIAC CATHETERIZATION  2008   minimal dz, Dr Cathie Olden  . CARDIAC CATHETERIZATION N/A 10/09/2015   Procedure: Left Heart Cath and Coronary Angiography;  Surgeon: Peter M Martinique, MD;  Location: [facility];  Service: Cardiovascular;  Laterality: N/A;  . CARDIAC CATHETERIZATION N/A 10/09/2015   Procedure: Intravascular Pressure Wire/FFR Study;  Surgeon: Peter M Martinique, MD;  Location: [facility];  Service: Cardiovascular;  Laterality: N/A;  . CARDIAC CATHETERIZATION N/A 10/09/2015   Procedure: Coronary Stent Intervention;  Surgeon: Peter M Martinique, MD;  Location: [facility];  Service: Cardiovascular;  Laterality: N/A;  . CAROTID ENDARTERECTOMY  Left   1998  . CHOLECYSTECTOMY    . CORONARY STENT PLACEMENT  10/09/2015   DES to RCA  . LEFT HEART CATHETERIZATION WITH CORONARY ANGIOGRAM N/A 09/15/2013   Procedure: LEFT HEART CATHETERIZATION WITH CORONARY ANGIOGRAM;  Surgeon: Peter M Martinique, MD;  Location: [facility] C[facility] LAB;  Service: Cardiovascular;   Laterality: N/A;  . PROSTATECTOMY  1998   radical for prostate cancer       Home Medications    Prior to Admission medications   Medication Sig Start Date End Date Taking? Authorizing Provider  Artificial Tear Solution (BION TEARS OP) Place 1 drop into both eyes daily as needed (dry eyes).    [provider]  aspirin 81 MG tablet Take 1 tablet (81 mg total) by mouth daily. 10/10/15   Arbutus Leas, NP  beclomethasone (QVAR) 40 MCG/ACT inhaler Inhale into the lungs. 07/03/15   [provider]  carvedilol (COREG) 6.25 MG tablet TAKE 1 TABLET BY MOUTH TWICE A DAY WITH A MEAL 02/17/17   Copland, Gay Filler, MD  ezetimibe (ZETIA) 10 MG tablet Take 1 tablet (10 mg total) by mouth daily. 09/08/16   Martinique, Peter M, MD  HYDROcodone-acetaminophen (NORCO/VICODIN) 5-325 MG tablet Take 0.5-1 tablets by mouth 2 (two) times daily as needed (pain). 11/23/16   Copland, Gay Filler, MD  ipratropium (ATROVENT) 0.03 % nasal spray Place 2 sprays into the nose 4 (four) times daily. 05/11/16   Copland, Gay Filler, MD  ketoconazole (NIZORAL) 2 % cream APPLY TO AFFECTED AREA TWICE A DAY 07/31/16   [provider]  levothyroxine (SYNTHROID, LEVOTHROID) 100 MCG tablet TAKE 1 TABLET EVERY DAY IN THE MORNING. 12/29/16   Copland, Gay Filler, MD  liothyronine (CYTOMEL) 5 MCG tablet Take 1 tablet (5 mcg total) by mouth every morning. 09/23/16   Copland, Gay Filler, MD  losartan-hydrochlorothiazide (HYZAAR) 100-12.5 MG tablet Take 1 tablet by mouth every other day.  01/24/16   [provider]  nitroGLYCERIN (NITROLINGUAL) 0.4 MG/SPRAY spray Place 1 spray under the tongue every 5 (five) minutes x 3 doses as needed for chest pain. 03/18/16   Almyra Deforest, PA  polyethylene glycol (MIRALAX / GLYCOLAX) packet Take 17 g by mouth daily. Mix in 8 oz liquid and drink    [provider]  Rivaroxaban 15 & 20 MG TBPK Take as directed on package: Start with one 15mg  tablet by mouth twice a day with food. On Day  22, switch to one 20mg  tablet once a day with food. 03/13/17   Daleen Bo, MD  triamcinolone cream (KENALOG) 0.1 % Apply 1 application topically daily as needed (rash).    [provider]  VENTOLIN HFA 108 (90 Base) MCG/ACT inhaler INHALE 2 PUFFS INTO THE LUNGS EVERY 6 HRS AS NEEDED FOR WHEEZING OR SHORTNESS OF BREATH 10/23/16   Copland, Gay Filler, MD    Family History Family History  Problem Relation Age of Onset  . Heart disease Mother   . Heart disease Father   . Kidney failure Father   . Heart disease Sister   . Heart attack Sister   . Dementia Sister   . Sjogren's syndrome Child   . Hashimoto's thyroiditis Child   . CAD Child  Social History Social History  Substance Use Topics  . Smoking status: Former Smoker    Packs/day: 2.50    Years: 30.00    Types: Cigarettes    Quit date: 08/28/1985  . Smokeless tobacco: Never Used  . Alcohol use 1.2 oz/week    2 Standard drinks or equivalent per week     Comment: 1 glass wine/week (prior 1 glass martini with dinner)     Allergies   Ciprofloxacin; Codeine; Flexeril [cyclobenzaprine]; Methocarbamol; Sulfa antibiotics; Sulfamethoxazole; Sulfonamide derivatives; and Flagyl [metronidazole]   Review of Systems Review of Systems  Cardiovascular: Negative for chest pain.  Gastrointestinal: Positive for blood in stool. Negative for abdominal pain, diarrhea, nausea and vomiting.  Neurological: Negative for dizziness and light-headedness.  All other systems reviewed and are negative.    Physical Exam Updated Vital Signs BP (!) 156/73   Pulse (!) 55   Temp 98.5 F (36.9 C) (Oral)   Resp (!) 21   Ht 5\' 6"  (1.676 m)   Wt 100.3 kg (221 lb 1.9 oz)   SpO2 98%   BMI 35.69 kg/m   Physical Exam  Constitutional: He is oriented to person, place, and time. He appears well-developed and well-nourished. No distress.  HENT:  Head: Normocephalic and atraumatic.  Right Ear: External ear normal.  Left Ear: External ear  normal.  Nose: Nose normal.  Eyes: Right eye exhibits no discharge. Left eye exhibits no discharge.  Neck: Neck supple.  Cardiovascular: Normal rate, regular rhythm and normal heart sounds.   Pulmonary/Chest: Effort normal and breath sounds normal.  Abdominal: Soft. He exhibits no distension. There is no tenderness.  Musculoskeletal: He exhibits no edema.  Neurological: He is alert and oriented to person, place, and time.  Skin: Skin is warm and dry. He is not diaphoretic.  Nursing note and vitals reviewed.    ED Treatments / Results  Labs (all labs ordered are listed, but only abnormal results are displayed) Labs Reviewed  COMPREHENSIVE METABOLIC PANEL - Abnormal; Notable for the following:       Result Value   Glucose, Bld 102 (*)    BUN 23 (*)    Creatinine, Ser 1.59 (*)    Total Bilirubin 1.5 (*)    GFR calc non Af Amer 39 (*)    GFR calc Af Amer 45 (*)    All other components within normal limits  CBC WITH DIFFERENTIAL/PLATELET - Abnormal; Notable for the following:    RBC 3.51 (*)    Hemoglobin 12.5 (*)    HCT 35.2 (*)    MCV 100.3 (*)    MCH 35.6 (*)    Platelets 139 (*)    Eosinophils Absolute 0.8 (*)    Basophils Absolute 0.2 (*)    All other components within normal limits    EKG  EKG Interpretation None       Radiology No results found.  Procedures Procedures (including critical care time)  Medications Ordered in ED Medications  pantoprazole (PROTONIX) injection 40 mg (40 mg Intravenous Given 03/24/17 1914)     Initial Impression / Assessment and Plan / ED Course  I have reviewed the triage vital signs and the nursing notes.  Pertinent labs & imaging results that were available during my care of the patient were reviewed by me and considered in my medical decision making (see chart for details).     Rectal exam deferred given patient are to had one at his PCPs office which was Hemoccult positive and melanotic. He  is stable here. However given  his melena in the setting of being on a blood thinner with mild drop in hemoglobin, I am concerned about upper GI bleed. He will be given IV Protonix and admitted to the [facility] for observation. Dr. Loleta Books to admit.  Final Clinical Impressions(s) / ED Diagnoses   Final diagnoses:  Melena    New Prescriptions New Prescriptions   No medications on file     Sherwood Gambler, MD 03/24/17 [phone]

## 2017-03-24 NOTE — Progress Notes (Signed)
83 yo M with recent onset Afib started on Xarelto.  Since then, has had melena.  BP (!) 156/73   Pulse (!) 55   Temp 98.5 F (36.9 C) (Oral)   Resp (!) 21   Ht 5\' 6"  (1.676 m)   Wt 100.3 kg (221 lb 1.9 oz)   SpO2 98%   BMI 35.69 kg/m   -Na 138, K 4.8, Cr 1.6 (baseline 1.7), WBC 6.3K, Hgb 12.5 (from baseline 13.2) (macrocytic actually) -PCP thought his stool was heme-positive -NPO, got PPI IV   To med surg bed, OBS status Request for [facility] consult through paging service

## 2017-03-24 NOTE — Patient Instructions (Addendum)
For your recent blood in stool and heme positive testing in our office, we need to have you evaluated in the emergency department. With your recent starting Xarelto, abdominal pain, and heme positive stool workup for GI bleed would be indicated. Getting stat labs would also be indicated.  With your recent atrial fibrillation being placed on a monitor would be beneficial and ED may need to get advice from cardiologist  if [place] can be officially stopped pending the GI workup.  I discussed your recent presentation with Dr. Lorelei Pont and she agrees this is the best course of action.  Follow up with [place] as needed after the emergency department evaluation.

## 2017-03-24 NOTE — ED Notes (Signed)
ED Provider at bedside. 

## 2017-03-24 NOTE — Progress Notes (Signed)
Subjective:    Patient ID: [name], male    DOB: [birth year], 83 y.o.   MRN: [id number]  HPI   Pt in for follow up.  Pt states recently just yesterday dark black stools for one day(yesterday). Pt reports no recent peptobismal recently. Pt has not been using any of his various supplaments which he was formerly on. Pt states that he is not on iron. Some left upper quadrant abdomen pain.   Pt just diagnosed with paroxysmal atrial fibrillation Just recently. He stopped aspirin and has been on xarelto. On discussion with Dr. Lorelei Pont may have only had brief episode of atrial fibrillation in the ambulance away to the emergency department.  Pt had been on Xarelto 15 mg bid since 03-13-2017. Pt last xarelto was 15 mg one tab yesterday morning  Pt has no GI bleed history. No nausea or vomiting.  Pt also states just recently he had slight faint left upper flank. Pain comes and goes. Intensity varies but then subsides. He notices more at night last 2 nights. Pt has not had any diarrhea.     Review of Systems  Constitutional: Negative for chills, fatigue and fever.  HENT: Negative for congestion and drooling.   Respiratory: Negative for cough, chest tightness, shortness of breath and wheezing.   Cardiovascular: Negative for chest pain and palpitations.  Gastrointestinal: Positive for abdominal pain and blood in stool. Negative for constipation, diarrhea, nausea, rectal pain and vomiting.       Left upper quadrant transient intemittent.  Genitourinary: Negative for dysuria and flank pain.  Musculoskeletal: Negative for back pain, joint swelling and neck pain.  Skin: Negative for rash.  Neurological: Negative for dizziness, seizures, speech difficulty, weakness, numbness and headaches.  Hematological: Negative for adenopathy. Does not bruise/bleed easily.  Psychiatric/Behavioral: Negative for behavioral problems, confusion, dysphoric mood and hallucinations. The patient is not nervous/anxious.      Past Medical History:  Diagnosis Date  . Anginal pain ([place])   . Anxiety    " OCCASIONAL"  . Arthritis   . Asthma due to seasonal allergies    uses inhalers prn  . Carotid artery occlusion    Left  . Complication of anesthesia    " had tremors after prostate surgery  . Coronary artery disease   . Diverticulitis    recurrent  . Family history of anesthesia complication    MOTHER   . GERD (gastroesophageal reflux disease)   . H/O hiatal hernia   . Hearing aid worn   . Hyperlipemia   . Hypertension   . Kidney stone    lithotripsy 2778 w complications, req stents, Dr Rosana Hoes  . Prostate CA ([place])   . Shortness of breath   . Thyroid disease   . TIA (transient ischemic attack)   . Tinnitus      Social History   Social History  . Marital status: Married    Spouse name: N/A  . Number of children: N/A  . Years of education: N/A   Occupational History  . retired     Actor [employer]   Social History Main Topics  . Smoking status: Former Smoker    Packs/day: 2.50    Years: 30.00    Types: Cigarettes    Quit date: 08/28/1985  . Smokeless tobacco: Never Used  . Alcohol use 1.2 oz/week    2 Standard drinks or equivalent per week     Comment: 1 glass wine/week (prior 1 glass martini with dinner)  .  Drug use: No  . Sexual activity: Yes    Birth control/ protection: None   Other Topics Concern  . Not on file   Social History Narrative   Lives with wife--recently diagnosed with breast ca; No smoking; Occassional wine; Retired; 2 daughters live in s.e--5 grandchildren(boys). On weight watchers. Lives in [place] with wife. Retired Solicitor. Tobacco history 2 ppd x 32 years. No smoking x 25 years. No drugs.    Past Surgical History:  Procedure Laterality Date  . CARDIAC CATHETERIZATION  2008   minimal dz, Dr Cathie Olden  . CARDIAC CATHETERIZATION N/A 10/09/2015   Procedure: Left Heart Cath and Coronary Angiography;  Surgeon: Peter M  Martinique, MD;  Location: [place] [facility];  Service: Cardiovascular;  Laterality: N/A;  . CARDIAC CATHETERIZATION N/A 10/09/2015   Procedure: Intravascular Pressure Wire/FFR Study;  Surgeon: Peter M Martinique, MD;  Location: [place] [facility];  Service: Cardiovascular;  Laterality: N/A;  . CARDIAC CATHETERIZATION N/A 10/09/2015   Procedure: Coronary Stent Intervention;  Surgeon: Peter M Martinique, MD;  Location: [place] [facility];  Service: Cardiovascular;  Laterality: N/A;  . CAROTID ENDARTERECTOMY  Left   1998  . CHOLECYSTECTOMY    . CORONARY STENT PLACEMENT  10/09/2015   DES to RCA  . LEFT HEART CATHETERIZATION WITH CORONARY ANGIOGRAM N/A 09/15/2013   Procedure: LEFT HEART CATHETERIZATION WITH CORONARY ANGIOGRAM;  Surgeon: Peter M Martinique, MD;  Location: [facility];  Service: Cardiovascular;  Laterality: N/A;  . PROSTATECTOMY  1998   radical for prostate cancer    Family History  Problem Relation Age of Onset  . Heart disease Mother   . Heart disease Father   . Kidney failure Father   . Heart disease Sister   . Heart attack Sister   . Dementia Sister   . Sjogren's syndrome Child   . Hashimoto's thyroiditis Child   . CAD Child     Allergies  Allergen Reactions  . Ciprofloxacin Other (See Comments)    Hallucinations or jittery Hallucinations or jittery  . Codeine Other (See Comments)    hallucinations hallucinations  . Flexeril [Cyclobenzaprine]   . Methocarbamol   . Sulfa Antibiotics Other (See Comments)    Chills and shaking "serum sickness" Chills and shaking "serum sickness"  . Sulfamethoxazole Other (See Comments)    UNKNOWN REACTION, NOT DOCUMENTED  . Sulfonamide Derivatives Other (See Comments)    Chills and shaking "serum sickness"  . Flagyl [Metronidazole] Rash    Current Outpatient Prescriptions on File Prior to Visit  Medication Sig Dispense Refill  . Artificial Tear Solution (BION TEARS OP) Place 1 drop into both eyes daily as needed (dry eyes).    Marland Kitchen aspirin  81 MG tablet Take 1 tablet (81 mg total) by mouth daily. 30 tablet 12  . beclomethasone (QVAR) 40 MCG/ACT inhaler Inhale into the lungs.    . carvedilol (COREG) 6.25 MG tablet TAKE 1 TABLET BY MOUTH TWICE A DAY WITH A MEAL 180 tablet 3  . ezetimibe (ZETIA) 10 MG tablet Take 1 tablet (10 mg total) by mouth daily. 90 tablet 3  . HYDROcodone-acetaminophen (NORCO/VICODIN) 5-325 MG tablet Take 0.5-1 tablets by mouth 2 (two) times daily as needed (pain). 60 tablet 0  . ipratropium (ATROVENT) 0.03 % nasal spray Place 2 sprays into the nose 4 (four) times daily. 30 mL 6  . ketoconazole (NIZORAL) 2 % cream APPLY TO AFFECTED AREA TWICE A DAY  2  . levothyroxine (SYNTHROID, LEVOTHROID) 100 MCG tablet  TAKE 1 TABLET EVERY DAY IN THE MORNING. 90 tablet 1  . liothyronine (CYTOMEL) 5 MCG tablet Take 1 tablet (5 mcg total) by mouth every morning. 90 tablet 3  . losartan-hydrochlorothiazide (HYZAAR) 100-12.5 MG tablet Take 1 tablet by mouth every other day.   3  . nitroGLYCERIN (NITROLINGUAL) 0.4 MG/SPRAY spray Place 1 spray under the tongue every 5 (five) minutes x 3 doses as needed for chest pain. 12 g 12  . polyethylene glycol (MIRALAX / GLYCOLAX) packet Take 17 g by mouth daily. Mix in 8 oz liquid and drink    . Rivaroxaban 15 & 20 MG TBPK Take as directed on package: Start with one 15mg  tablet by mouth twice a day with food. On Day 22, switch to one 20mg  tablet once a day with food. 51 each 0  . triamcinolone cream (KENALOG) 0.1 % Apply 1 application topically daily as needed (rash).    . VENTOLIN HFA 108 (90 Base) MCG/ACT inhaler INHALE 2 PUFFS INTO THE LUNGS EVERY 6 HRS AS NEEDED FOR WHEEZING OR SHORTNESS OF BREATH 18 Inhaler 0   No current facility-administered medications on file prior to visit.     BP 139/87   Pulse 60   Temp 97.8 F (36.6 C) (Oral)   Resp 16   Ht 5\' 6"  (1.676 m)   Wt 221 lb 12.8 oz (100.6 kg)   SpO2 99%   BMI 35.80 kg/m       Objective:   Physical Exam  General  Appearance- Not in acute distress.  HEENT Eyes- Scleraeral/Conjuntiva-bilat- Not Yellow. Mouth & Throat- Normal.  Chest and Lung Exam Auscultation: Breath sounds:-Normal. Adventitious sounds:- No Adventitious sounds.  Cardiovascular Auscultation:Rythm - Regular. Heart Sounds -Normal heart sounds.  Abdomen Inspection:-Inspection Normal.  Palpation/Perucssion: Palpation and Percussion of the abdomen reveal- Very faint minimal left upper quadrant Tender, No Rebound tenderness, No rigidity(Guarding) and No Palpable abdominal masses.  Liver:-Normal.  Spleen:- Normal.   Rectal Anorectal Exam: Stool - Hemoccult of stool/mucous is Heme Negative. External - normal external exam. Internal - normal sphincter tone. No rectal mass. Heme + stool.      Assessment & Plan:   Patient's EKG does not show atrial fibrillation today. It showed sinus bradycardia appears possible right bundle branch block.  For your recent blood in stool and heme positive testing in our office, we need to have you evaluated in the emergency department. With your recent starting Xarelto, abdominal pain, and heme positive stool workup for GI bleed would be indicated. Getting stat labs would also be indicated.  With your recent atrial fibrillation being placed on a monitor would be beneficial and ED may need to get advice from cardiologist as to if Xarelto can be officially stopped pending the GI workup.  I discussed your recent presentation with Dr. Lorelei Pont and she agrees this is the best course of action.  Follow up with [place] as needed after the emergency department evaluation.  Cydne Grahn, Percell Miller, PA-C

## 2017-03-24 NOTE — ED Notes (Signed)
Receiving nurse unable to take report at this time.  She will call back.

## 2017-03-25 DIAGNOSIS — I10 Essential (primary) hypertension: Secondary | ICD-10-CM | POA: Diagnosis not present

## 2017-03-25 DIAGNOSIS — D62 Acute posthemorrhagic anemia: Secondary | ICD-10-CM

## 2017-03-25 DIAGNOSIS — K921 Melena: Secondary | ICD-10-CM | POA: Diagnosis not present

## 2017-03-25 DIAGNOSIS — N183 Chronic kidney disease, stage 3 (moderate): Secondary | ICD-10-CM

## 2017-03-25 LAB — BASIC METABOLIC PANEL
ANION GAP: 7 (ref 5–15)
BUN: 21 mg/dL — AB (ref 6–20)
CALCIUM: 8.8 mg/dL — AB (ref 8.9–10.3)
CO2: 28 mmol/L (ref 22–32)
Chloride: 104 mmol/L (ref 101–111)
Creatinine, Ser: 1.66 mg/dL — ABNORMAL HIGH (ref 0.61–1.24)
GFR calc Af Amer: 43 mL/min — ABNORMAL LOW (ref >60)
GFR calc non Af Amer: 37 mL/min — ABNORMAL LOW (ref >60)
GLUCOSE: 97 mg/dL (ref 65–99)
POTASSIUM: 3.9 mmol/L (ref 3.5–5.1)
SODIUM: 139 mmol/L (ref 135–145)

## 2017-03-25 LAB — CBC
HCT: 34.2 % — ABNORMAL LOW (ref 39.0–52.0)
Hemoglobin: 12.2 g/dL — ABNORMAL LOW (ref 13.0–17.0)
MCH: 35.4 pg — AB (ref 26.0–34.0)
MCHC: 35.7 g/dL (ref 30.0–36.0)
MCV: 99.1 fL (ref 78.0–100.0)
PLATELETS: 121 10*3/uL — AB (ref 150–400)
RBC: 3.45 MIL/uL — ABNORMAL LOW (ref 4.22–5.81)
RDW: 14.1 % (ref 11.5–15.5)
WBC: 5.4 10*3/uL (ref 4.0–10.5)

## 2017-03-25 NOTE — Consult Note (Signed)
[facility] Gastroenterology Consultation Note  Referring Provider: Dr. Charlynne Cousins Primary Care Physician:  [name], MD Primary Gastroenterologist:  Dr. [name]   Reason for Consultation:  melena  HPI: [name] is a 83 y.o. male presenting 3 day history melena.  Takes rivaroxaban, last dose couple days ago.  Some mild left upper quadrant discomfort.  No GERD, dysphagia, hematemesis, hematochezia.  Remote history of ulcers years ago.   Past Medical History:  Diagnosis Date  . Anginal pain ([place])   . Anxiety    " OCCASIONAL"  . Arthritis   . Asthma due to seasonal allergies    uses inhalers prn  . Carotid artery occlusion    Left  . Complication of anesthesia    " had tremors after prostate surgery  . Coronary artery disease   . Diverticulitis    recurrent  . Family history of anesthesia complication    MOTHER   . GERD (gastroesophageal reflux disease)   . H/O hiatal hernia   . Hearing aid worn   . Hyperlipemia   . Hypertension   . Kidney stone    lithotripsy 3335 w complications, req stents, Dr Rosana Hoes  . Prostate CA ([place])   . Shortness of breath   . Thyroid disease   . TIA (transient ischemic attack)   . Tinnitus     Past Surgical History:  Procedure Laterality Date  . CARDIAC CATHETERIZATION  2008   minimal dz, Dr Cathie Olden  . CARDIAC CATHETERIZATION N/A 10/09/2015   Procedure: Left Heart Cath and Coronary Angiography;  Surgeon: Peter M Martinique, MD;  Location: [place] [facility];  Service: Cardiovascular;  Laterality: N/A;  . CARDIAC CATHETERIZATION N/A 10/09/2015   Procedure: Intravascular Pressure Wire/FFR Study;  Surgeon: Peter M Martinique, MD;  Location: [place] [facility];  Service: Cardiovascular;  Laterality: N/A;  . CARDIAC CATHETERIZATION N/A 10/09/2015   Procedure: Coronary Stent Intervention;  Surgeon: Peter M Martinique, MD;  Location: [place] [facility];  Service: Cardiovascular;  Laterality: N/A;  . CAROTID ENDARTERECTOMY  Left   1998  .  CHOLECYSTECTOMY    . CORONARY STENT PLACEMENT  10/09/2015   DES to RCA  . LEFT HEART CATHETERIZATION WITH CORONARY ANGIOGRAM N/A 09/15/2013   Procedure: LEFT HEART CATHETERIZATION WITH CORONARY ANGIOGRAM;  Surgeon: Peter M Martinique, MD;  Location: [facility] CATH LAB;  Service: Cardiovascular;  Laterality: N/A;  . PROSTATECTOMY  1998   radical for prostate cancer    Prior to Admission medications   Medication Sig Start Date End Date Taking? Authorizing Provider  carvedilol (COREG) 6.25 MG tablet TAKE 1 TABLET BY MOUTH TWICE A DAY WITH A MEAL 02/17/17  Yes Copland, Gay Filler, MD  ezetimibe (ZETIA) 10 MG tablet Take 1 tablet (10 mg total) by mouth daily. 09/08/16  Yes Martinique, Peter M, MD  HYDROcodone-acetaminophen (NORCO/VICODIN) 5-325 MG tablet Take 0.5-1 tablets by mouth 2 (two) times daily as needed (pain). 11/23/16  Yes Copland, Gay Filler, MD  hydrocortisone 2.5 % cream Apply 1 application topically 2 (two) times daily.   Yes [provider]  ipratropium (ATROVENT) 0.03 % nasal spray Place 2 sprays into the nose 4 (four) times daily. 05/11/16  Yes Copland, Gay Filler, MD  levothyroxine (SYNTHROID, LEVOTHROID) 100 MCG tablet TAKE 1 TABLET EVERY DAY IN THE MORNING. 12/29/16  Yes Copland, Gay Filler, MD  liothyronine (CYTOMEL) 5 MCG tablet Take 1 tablet (5 mcg total) by mouth every morning. 09/23/16  Yes Copland, Gay Filler, MD  losartan (  COZAAR) 100 MG tablet Take 100 mg by mouth at bedtime.   Yes [provider]  nitroGLYCERIN (NITROLINGUAL) 0.4 MG/SPRAY spray Place 1 spray under the tongue every 5 (five) minutes x 3 doses as needed for chest pain. 03/18/16  Yes Meng, Isaac Laud, PA  polyethylene glycol (MIRALAX / GLYCOLAX) packet Take 17 g by mouth daily. Mix in 8 oz liquid and drink   Yes [provider]  PRESCRIPTION MEDICATION Take 1 capsule by mouth daily. Nystatin/Amphoteracin B   Yes [provider]  Rivaroxaban 15 & 20 MG TBPK Take as directed on package: Start with one 15mg   tablet by mouth twice a day with food. On Day 22, switch to one 20mg  tablet once a day with food. 03/13/17  Yes Daleen Bo, MD  triamcinolone cream (KENALOG) 0.1 % Apply 1 application topically daily as needed (rash).   Yes [provider]  VENTOLIN HFA 108 (90 Base) MCG/ACT inhaler INHALE 2 PUFFS INTO THE LUNGS EVERY 6 HRS AS NEEDED FOR WHEEZING OR SHORTNESS OF BREATH 10/23/16  Yes Copland, Gay Filler, MD  Artificial Tear Solution (BION TEARS OP) Place 1 drop into both eyes daily as needed (dry eyes).    [provider]  aspirin 81 MG tablet Take 1 tablet (81 mg total) by mouth daily. 10/10/15   Arbutus Leas, NP    Current Facility-Administered Medications  Medication Dose Route Frequency Provider Last Rate Last Dose  . 0.9 %  sodium chloride infusion   Intravenous Continuous Charlynne Cousins, MD   Stopped at 03/25/17 1158  . carvedilol (COREG) tablet 6.25 mg  6.25 mg Oral BID WC Edwin Dada, MD   6.25 mg at 03/25/17 0857  . ezetimibe (ZETIA) tablet 10 mg  10 mg Oral Daily Edwin Dada, MD   10 mg at 03/25/17 0857  . levothyroxine (SYNTHROID, LEVOTHROID) tablet 100 mcg  100 mcg Oral QAC breakfast Edwin Dada, MD   100 mcg at 03/25/17 0857  . liothyronine (CYTOMEL) tablet 5 mcg  5 mcg Oral q morning - 10a Danford, Suann Larry, MD   5 mcg at 03/25/17 0857  . losartan (COZAAR) tablet 100 mg  100 mg Oral QHS Edwin Dada, MD   100 mg at 03/25/17 0010  . ondansetron (ZOFRAN) tablet 4 mg  4 mg Oral Q6H PRN Danford, Suann Larry, MD       Or  . ondansetron (ZOFRAN) injection 4 mg  4 mg Intravenous Q6H PRN Danford, Suann Larry, MD      . pantoprazole (PROTONIX) injection 40 mg  40 mg Intravenous Q12H Danford, Suann Larry, MD   40 mg at 03/25/17 0536  . polyethylene glycol (MIRALAX / GLYCOLAX) packet 17 g  17 g Oral Daily PRN Edwin Dada, MD        Allergies as of 03/24/2017 - Review Complete 03/24/2017  Allergen  Reaction Noted  . Ciprofloxacin Other (See Comments) 07/19/2014  . Codeine Other (See Comments) 07/19/2014  . Flexeril [cyclobenzaprine]  09/25/2015  . Methocarbamol  09/25/2015  . Sulfa antibiotics Other (See Comments) 11/05/2015  . Sulfamethoxazole Other (See Comments) 01/27/2012  . Sulfonamide derivatives Other (See Comments)   . Flagyl [metronidazole] Rash 08/09/2015    Family History  Problem Relation Age of Onset  . Heart disease Mother   . Heart disease Father   . Kidney failure Father   . Heart disease Sister   . Heart attack Sister   . Dementia Sister   .  Sjogren's syndrome Child   . Hashimoto's thyroiditis Child   . CAD Child     Social History   Social History  . Marital status: Married    Spouse name: N/A  . Number of children: N/A  . Years of education: N/A   Occupational History  . retired     Actor [employer]   Social History Main Topics  . Smoking status: Former Smoker    Packs/day: 2.50    Years: 30.00    Types: Cigarettes    Quit date: 08/28/1985  . Smokeless tobacco: Never Used  . Alcohol use 1.2 oz/week    2 Standard drinks or equivalent per week     Comment: 1 glass wine/week (prior 1 glass martini with dinner)  . Drug use: No  . Sexual activity: Yes    Birth control/ protection: None   Other Topics Concern  . Not on file   Social History Narrative   Lives with wife--recently diagnosed with breast ca; No smoking; Occassional wine; Retired; 2 daughters live in [place]--5 grandchildren(boys). On weight watchers. 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 Retired Solicitor. Tobacco history 2 ppd x 32 years. No smoking x 25 years. No drugs.    Review of Systems: As per HPI, all others negative  Physical Exam: Vital signs in last 24 hours: Temp:  [97.5 F (36.4 C)-98.5 F (36.9 C)] 97.7 F (36.5 C) (09/13 1311) Pulse Rate:  [52-63] 52 (09/13 1311) Resp:  [12-21] 18 (09/13 1311) BP: (128-175)/(62-82) 155/68 (09/13  1311) SpO2:  [96 %-100 %] 97 % (09/13 1311) Weight:  [221 lb 1.9 oz (100.3 kg)] 221 lb 1.9 oz (100.3 kg) (09/12 1653) Last BM Date: 03/23/17 General:   Alert, obese, younger-appearing than stated age, pleasant and cooperative in NAD Head:  Normocephalic and atraumatic. Eyes:  Sclera clear, no icterus.   Conjunctiva pink. Ears:  Mild presbyacusis Nose:  No deformity, discharge,  or lesions. Mouth:  No deformity or lesions.  Oropharynx pink & moist. Neck:  Thick but supple; no masses or thyromegaly. Abdomen:  Soft, protuberant, non-tender. No masses, hepatosplenomegaly or hernias noted. Normal bowel sounds, without guarding, and without rebound.     Msk:  Symmetrical without gross deformities. Normal posture. Pulses:  Normal pulses noted. Extremities:  Without clubbing or edema. Neurologic:  Alert and  oriented x4; diffusely weak, otherwise grossly normal neurologically. Skin:  Intact without significant lesions or rashes. Psych:  Alert and cooperative. Normal mood and affect.   Lab Results:  Recent Labs  03/24/17 1729 03/25/17 0549  WBC 6.3 5.4  HGB 12.5* 12.2*  HCT 35.2* 34.2*  PLT 139* 121*   BMET  Recent Labs  03/24/17 1729 03/25/17 0549  NA 138 139  K 4.8 3.9  CL 103 104  CO2 28 28  GLUCOSE 102* 97  BUN 23* 21*  CREATININE 1.59* 1.66*  CALCIUM 9.4 8.8*   LFT  Recent Labs  03/24/17 1729  PROT 6.8  ALBUMIN 3.9  AST 26  ALT 27  ALKPHOS 50  BILITOT 1.5*   PT/INR No results for input(s): LABPROT, INR in the last 72 hours.  Studies/Results: No results found.  Impression:  1.  Melena for a few days, non-destabilizing. 2.  Acute blood loss anemia, mild. 3.  Chronic anticoagulation, rivaroxaban, off for 2+ days. 4.  Remote history of peptic ulcer.  Plan:  1.  PPI. 2.  Continue to hold rivaroxaban. 3.  Soft diet tonight, NPO after midnight. 4.  Endoscopy  tomorrow. 5.  Risks (bleeding, infection, bowel perforation that could require surgery,  sedation-related changes in cardiopulmonary systems), benefits (identification and possible treatment of source of symptoms, exclusion of certain causes of symptoms), and alternatives (watchful waiting, radiographic imaging studies, empiric medical treatment) of upper endoscopy (EGD) were explained to patient/family in detail and patient wishes to proceed.   LOS: 0 days   Jac Romulus M  03/25/2017, 3:42 PM  Cell [phone] If no answer or after 5 PM call [phone]

## 2017-03-25 NOTE — Care Management Obs Status (Signed)
[place] NOTIFICATION   Patient Details  Name: [name] MRN: [id number] Date of Birth: 1934-02-28   Medicare Observation Status Notification Given:  Yes    Guadalupe Maple, RN 03/25/2017, 2:25 PM

## 2017-03-25 NOTE — Progress Notes (Signed)
We were consulted regarding this patient last night, but this is an [place] patient. Dr. Paulita Fujita will be seeing this patient later today.  Ellouise Newer, PA-C  [facility]

## 2017-03-25 NOTE — H&P (Signed)
History and Physical  Patient Name: [name]     VXB:[id number]    DOB: [birth year]    DOA: 03/24/2017 PCP: Zachary Mclean, MD  Patient coming from: Home --> PCP's office --> [facility]  Chief Complaint: Melena      HPI: [name] is a 83 y.o. male with a past medical history significant for HTN, hx of TIA, CKD, OSA not on CPAP, CAD s/p DES in 2015, and recent new onset Afib started on Xarelto who presents with melena.  The patient was in his usual state of health until about a week ago when he developed some chest discomfort, found to have atrial fibrillation, seen in the emergency room, and discharged on the new Xarelto.  Since starting Xarelto, he has had no further chest discomfort, although he notes to me that he's had numerous problems with bleeding (while shaving, from his IV in the ER last week, bruising), and then last night he had a bowel movement that looked "like coal or black tar".  Today he had another small dark BM, so he went to his PCP.  At his PCP's office, rectal exam was noted to be FOBT+, character of stool not noted, and he was sent tot he ER.  ED course: -Afebrile, heart rate 60, respirations and pulse ox normal, blood pressure 164/65 -Na 138, K 4.8, Cr 1.6 (baseline 1.7), WBC 6.3K, Hgb 12.5 (from baseline 13.2) (macrocytic actually) -He was given IV PPI and TRH were asked to accept in transfer    Of note, the patient reports to me that he has a long long history of easier than normal bleeding. He was seen by Dr. Marin Olp from Hematology and told that all was normal (from what I see actually he was referred to them in 2018 for elevated B12 level, and eosinophilia and nothing was reportedly abnormal).        ROS: Review of Systems  Gastrointestinal: Positive for abdominal pain and melena.  All other systems reviewed and are negative.         Past Medical History:  Diagnosis Date  . Anginal pain ([place])   . Anxiety    " OCCASIONAL"  . Arthritis     . Asthma due to seasonal allergies    uses inhalers prn  . Carotid artery occlusion    Left  . Complication of anesthesia    " had tremors after prostate surgery  . Coronary artery disease   . Diverticulitis    recurrent  . Family history of anesthesia complication    MOTHER   . GERD (gastroesophageal reflux disease)   . H/O hiatal hernia   . Hearing aid worn   . Hyperlipemia   . Hypertension   . Kidney stone    lithotripsy 3300 w complications, req stents, Dr Rosana Hoes  . Prostate CA ([place])   . Shortness of breath   . Thyroid disease   . TIA (transient ischemic attack)   . Tinnitus     Past Surgical History:  Procedure Laterality Date  . CARDIAC CATHETERIZATION  2008   minimal dz, Dr Cathie Olden  . CARDIAC CATHETERIZATION N/A 10/09/2015   Procedure: Left Heart Cath and Coronary Angiography;  Surgeon: Peter M Martinique, MD;  Location: [facility];  Service: Cardiovascular;  Laterality: N/A;  . CARDIAC CATHETERIZATION N/A 10/09/2015   Procedure: Intravascular Pressure Wire/FFR Study;  Surgeon: Peter M Martinique, MD;  Location: [facility];  Service: Cardiovascular;  Laterality: N/A;  . CARDIAC  CATHETERIZATION N/A 10/09/2015   Procedure: Coronary Stent Intervention;  Surgeon: Peter M Martinique, MD;  Location: [facility];  Service: Cardiovascular;  Laterality: N/A;  . CAROTID ENDARTERECTOMY  Left   1998  . CHOLECYSTECTOMY    . CORONARY STENT PLACEMENT  10/09/2015   DES to RCA  . LEFT HEART CATHETERIZATION WITH CORONARY ANGIOGRAM N/A 09/15/2013   Procedure: LEFT HEART CATHETERIZATION WITH CORONARY ANGIOGRAM;  Surgeon: Peter M Martinique, MD;  Location[facility];  Service: Cardiovascular;  Laterality: N/A;  . PROSTATECTOMY  1998   radical for prostate cancer    Social History: Patient lives with his wife.  The patient walks unassisted.  Nonsmoker.  From [place].  Worked for [employer] as a Nurse, children's.  Allergies  Allergen Reactions  . Ciprofloxacin Other (See  Comments)    Hallucinations or jittery Hallucinations or jittery  . Codeine Other (See Comments)    hallucinations hallucinations  . Flexeril [Cyclobenzaprine]   . Methocarbamol   . Sulfa Antibiotics Other (See Comments)    Chills and shaking "serum sickness" Chills and shaking "serum sickness"  . Sulfamethoxazole Other (See Comments)    UNKNOWN REACTION, NOT DOCUMENTED  . Sulfonamide Derivatives Other (See Comments)    Chills and shaking "serum sickness"  . Flagyl [Metronidazole] Rash    Family history: family history includes CAD in his child; Dementia in his sister; Hashimoto's thyroiditis in his child; Heart attack in his sister; Heart disease in his father, mother, and sister; Kidney failure in his father; Sjogren's syndrome in his child.  Prior to Admission medications   Medication Sig Start Date End Date Taking? Authorizing Provider  carvedilol (COREG) 6.25 MG tablet TAKE 1 TABLET BY MOUTH TWICE A DAY WITH A MEAL 02/17/17  Yes Copland, Gay Filler, MD  ezetimibe (ZETIA) 10 MG tablet Take 1 tablet (10 mg total) by mouth daily. 09/08/16  Yes Martinique, Peter M, MD  HYDROcodone-acetaminophen (NORCO/VICODIN) 5-325 MG tablet Take 0.5-1 tablets by mouth 2 (two) times daily as needed (pain). 11/23/16  Yes Copland, Gay Filler, MD  hydrocortisone 2.5 % cream Apply 1 application topically 2 (two) times daily.   Yes [provider]  ipratropium (ATROVENT) 0.03 % nasal spray Place 2 sprays into the nose 4 (four) times daily. 05/11/16  Yes Copland, Gay Filler, MD  levothyroxine (SYNTHROID, LEVOTHROID) 100 MCG tablet TAKE 1 TABLET EVERY DAY IN THE MORNING. 12/29/16  Yes Copland, Gay Filler, MD  liothyronine (CYTOMEL) 5 MCG tablet Take 1 tablet (5 mcg total) by mouth every morning. 09/23/16  Yes Copland, Gay Filler, MD  losartan (COZAAR) 100 MG tablet Take 100 mg by mouth at bedtime.   Yes [provider]  nitroGLYCERIN (NITROLINGUAL) 0.4 MG/SPRAY spray Place 1 spray under the tongue every 5  (five) minutes x 3 doses as needed for chest pain. 03/18/16  Yes Meng, Isaac Laud, PA  polyethylene glycol (MIRALAX / GLYCOLAX) packet Take 17 g by mouth daily. Mix in 8 oz liquid and drink   Yes [provider]  PRESCRIPTION MEDICATION Take 1 capsule by mouth daily. Nystatin/Amphoteracin B   Yes [provider]  Rivaroxaban 15 & 20 MG TBPK Take as directed on package: Start with one 15mg  tablet by mouth twice a day with food. On Day 22, switch to one 20mg  tablet once a day with food. 03/13/17  Yes Daleen Bo, MD  triamcinolone cream (KENALOG) 0.1 % Apply 1 application topically daily as needed (rash).   Yes [provider]  Enid Cutter  HFA 108 (90 Base) MCG/ACT inhaler INHALE 2 PUFFS INTO THE LUNGS EVERY 6 HRS AS NEEDED FOR WHEEZING OR SHORTNESS OF BREATH 10/23/16  Yes Copland, Gay Filler, MD  Artificial Tear Solution (BION TEARS OP) Place 1 drop into both eyes daily as needed (dry eyes).    [provider]  aspirin 81 MG tablet Take 1 tablet (81 mg total) by mouth daily. 10/10/15   Arbutus Leas, NP       Physical Exam: BP 128/62 (BP Location: Right Arm)   Pulse 63   Temp (!) 97.5 F (36.4 C) (Oral)   Resp 16   Ht 5\' 6"  (1.676 m)   Wt 100.3 kg (221 lb 1.9 oz)   SpO2 100%   BMI 35.69 kg/m  General appearance: Well-developed, obese elderly adult male, alert and in no acut distress.   Eyes: Anicteric, conjunctiva pink, lids and lashes normal. PERRL.    ENT: No nasal deformity, discharge, epistaxis.  Hearing diminished. OP moist without lesions.   Neck: No neck masses.  Trachea midline.  No thyromegaly/tenderness. Lymph: No cervical or supraclavicular lymphadenopathy. Skin: Warm and dry.  No jaundice.  No suspicious rashes or lesions. Cardiac: RRR, nl S1-S2, no murmurs appreciated.  Capillary refill is brisk.  JVP not visible.  No LE edema.  Radial and DP pulses 2+ and symmetric. Respiratory: Normal respiratory rate and rhythm.  CTAB without rales or  wheezes. Abdomen: Abdomen soft.  Mild very focal nonphysiologic LUQ TTP without gaurding or rebound. No ascites, distension, hepatosplenomegaly.   MSK: No deformities or effusions.  No cyanosis or clubbing. Neuro: Cranial nerves normal.  Sensation intact to light touch. Speech is fluent.  Muscle strength normal.    Psych: Sensorium intact and responding to questions, attention normal.  Behavior appropriate.  Affect normal.  Judgment and insight appear normal.     Labs on Admission:  I have personally reviewed following labs and imaging studies: CBC:  Recent Labs Lab 03/24/17 1729 03/25/17 0549  WBC 6.3 5.4  NEUTROABS 2.6  --   HGB 12.5* 12.2*  HCT 35.2* 34.2*  MCV 100.3* 99.1  PLT 139* 751*   Basic Metabolic Panel:  Recent Labs Lab 03/24/17 1729  NA 138  K 4.8  CL 103  CO2 28  GLUCOSE 102*  BUN 23*  CREATININE 1.59*  CALCIUM 9.4   GFR: Estimated Creatinine Clearance: 40.4 mL/min (A) (by C-G formula based on SCr of 1.59 mg/dL (H)).  Liver Function Tests:  Recent Labs Lab 03/24/17 1729  AST 26  ALT 27  ALKPHOS 50  BILITOT 1.5*  PROT 6.8  ALBUMIN 3.9   No results for input(s): LIPASE, AMYLASE in the last 168 hours. No results for input(s): AMMONIA in the last 168 hours. Coagulation Profile: No results for input(s): INR, PROTIME in the last 168 hours. Cardiac Enzymes: No results for input(s): CKTOTAL, CKMB, CKMBINDEX, TROPONINI in the last 168 hours. BNP (last 3 results) No results for input(s): PROBNP in the last 8760 hours. HbA1C: No results for input(s): HGBA1C in the last 72 hours. CBG: No results for input(s): GLUCAP in the last 168 hours. Lipid Profile: No results for input(s): CHOL, HDL, LDLCALC, TRIG, CHOLHDL, LDLDIRECT in the last 72 hours. Thyroid Function Tests: No results for input(s): TSH, T4TOTAL, FREET4, T3FREE, THYROIDAB in the last 72 hours. Anemia Panel: No results for input(s): VITAMINB12, FOLATE, FERRITIN, TIBC, IRON, RETICCTPCT in  the last 72 hours. Sepsis Labs: Invalid input(s): PROCALCITONIN, LACTICIDVEN No results found for this or  any previous visit (from the past 240 hour(s)).      EKG: Independently reviewed. Rate 57. QTc normal, RBBB, no change from previous, sinus rhythm.    Assessment/Plan Principal Problem:   Melena Active Problems:   Hypothyroidism, acquired   Coronary artery disease due to lipid rich plaque   Essential hypertension   CKD (chronic kidney disease), stage III   AF (paroxysmal atrial fibrillation) ([place])  1. Melena:    -Hold Xarelto -NPO -Continue PPI BID IV -Consult to GI, appreciate cares   2. Hypertension and coronary disease secondary prevention:  -Continue Coreg -Continue losartan and Zetia  3. Atrial fibrillation:  CHADS2-VASc 6. -Hold Xarelto for now -Continue Coreg  4. Hypothyroidism:  -Continue levothyroxine and Cytomel  5. OSA:  -He declines CPAP  6. Other medications:  -Continue home Bowel regimen      DVT prophylaxis: SCDs  Code Status: FULL  Family Communication: None present  Disposition Plan: Anticipate NPO and PPI and GI consult  Consults called: None overnight Admission status: OBS At the point of initial evaluation, it is my clinical opinion that admission for OBSERVATION is reasonable and necessary because the patient's presenting complaints in the context of their chronic conditions represent sufficient risk of deterioration or significant morbidity to constitute reasonable grounds for close observation in the hospital setting, but that the patient may be medically stable for discharge from the hospital within 24 to 48 hours.    Medical decision making: Patient seen at 10:40 PM on 03/24/2017.  The patient was discussed with Dr. Regenia Skeeter.  What exists of the patient's chart was reviewed in depth and summarized above.  Clinical condition: stable.        Edwin Dada [facility] Pager [phone]

## 2017-03-25 NOTE — Progress Notes (Signed)
[facility] PROGRESS NOTE    Progress Note  [name]  PRF:[id number] DOB: [birth year] DOA: 03/24/2017 PCP: [name], MD     Brief Narrative:   [name] is an 83 y.o. male past medical history significant for hypertension coronary artery sees with a drug-eluting stent in 2015 recent new onset atrial fibrillation on Xarelto who presents with melena.  Assessment/Plan:   Melena: Agree with holding Xarelto, continue IV PPI twice a day. Follow clear liquid diet, we have called a GI for possible intervention if she continues to have melanotic stools. Her baseline hemoglobin is around 13, this morning is 12.2. he has been typing cross she has to IV peripheral lines.  Hypothyroidism, acquired Continue Synthroid and Cytomel.  Essential hypertension Continue Coreg, losartan and severe.  Atrial fibrillation:  CHADS2-VASc 6. -Hold Xarelto for now   Coronary artery disease due to lipid rich plaque  CKD (chronic kidney disease), stage III: Continue to hold Xarelto, GFR seems to be at baseline. With his low creatinine clearance is GFR will take longer to clear.    DVT prophylaxis: SCD Family Communication:wife Disposition Plan/Barrier to D/C: home once bleeding stops Code Status:     Code Status Orders        Start     Ordered   03/24/17 2313  Full code  Continuous     03/24/17 2314    Code Status History    Date Active Date Inactive Code Status Order ID Comments User Context   03/24/2015 11:38 AM 03/25/2015  6:30 PM Full Code [id number]  Juliet Rude, MD Inpatient   09/15/2013  5:57 PM 09/16/2013  3:39 PM Full Code [id number]  Martinique, Peter M, MD Inpatient   09/15/2013  7:34 AM 09/15/2013  5:57 PM Full Code [id number]  Waldemar Dickens, MD Inpatient    Advance Directive Documentation     Most Recent Value  Type of Advance Directive  Healthcare Power of [place], Living will  Pre-existing out of facility DNR order (yellow form or pink MOST form)  -  "MOST"  Form in Place?  -        IV Access:    Peripheral IV   Procedures and diagnostic studies:   No results found.   Medical Consultants:    None.  Anti-Infectives:   none  Subjective:    [name] he denies any abdominal pain or further melanotic stools.  Objective:    Vitals:   03/24/17 2030 03/24/17 2120 03/24/17 2219 03/25/17 0508  BP: (!) 165/74 (!) 175/80 (!) 163/81 128/62  Pulse: (!) 59 62 (!) 55 63  Resp: 12 16 16 16   Temp:  97.7 F (36.5 C)  (!) 97.5 F (36.4 C)  TempSrc:  Oral  Oral  SpO2: 96% 99%  100%  Weight:      Height:        Intake/Output Summary (Last 24 hours) at 03/25/17 0817 Last data filed at 03/25/17 0600  Gross per 24 hour  Intake           727.08 ml  Output              450 ml  Net           277.08 ml   Filed Weights   03/24/17 1653  Weight: 100.3 kg (221 lb 1.9 oz)    Exam: General exam: In no acute distress. Respiratory system: Good air movement and clear to auscultation. Cardiovascular system: S1 & S2  heard, RRR. No JVD, murmurs, rubs, gallops or clicks.  Gastrointestinal system: Positive bowel sounds soft nontender nondistended. Central nervous system: Alert and oriented. No focal neurological deficits. Extremities: No pedal edema. Skin: No rashes, lesions or ulcers Psychiatry: Judgement and insight appear normal. Mood & affect appropriate.    Data Reviewed:    Labs: Basic Metabolic Panel:  Recent Labs Lab 03/24/17 1729 03/25/17 0549  NA 138 139  K 4.8 3.9  CL 103 104  CO2 28 28  GLUCOSE 102* 97  BUN 23* 21*  CREATININE 1.59* 1.66*  CALCIUM 9.4 8.8*   GFR Estimated Creatinine Clearance: 38.7 mL/min (A) (by C-G formula based on SCr of 1.66 mg/dL (H)). Liver Function Tests:  Recent Labs Lab 03/24/17 1729  AST 26  ALT 27  ALKPHOS 50  BILITOT 1.5*  PROT 6.8  ALBUMIN 3.9   No results for input(s): LIPASE, AMYLASE in the last 168 hours. No results for input(s): AMMONIA in the last 168  hours. Coagulation profile No results for input(s): INR, PROTIME in the last 168 hours.  CBC:  Recent Labs Lab 03/24/17 1729 03/25/17 0549  WBC 6.3 5.4  NEUTROABS 2.6  --   HGB 12.5* 12.2*  HCT 35.2* 34.2*  MCV 100.3* 99.1  PLT 139* 121*   Cardiac Enzymes: No results for input(s): CKTOTAL, CKMB, CKMBINDEX, TROPONINI in the last 168 hours. BNP (last 3 results) No results for input(s): PROBNP in the last 8760 hours. CBG: No results for input(s): GLUCAP in the last 168 hours. D-Dimer: No results for input(s): DDIMER in the last 72 hours. Hgb A1c: No results for input(s): HGBA1C in the last 72 hours. Lipid Profile: No results for input(s): CHOL, HDL, LDLCALC, TRIG, CHOLHDL, LDLDIRECT in the last 72 hours. Thyroid function studies: No results for input(s): TSH, T4TOTAL, T3FREE, THYROIDAB in the last 72 hours.  Invalid input(s): FREET3 Anemia work up: No results for input(s): VITAMINB12, FOLATE, FERRITIN, TIBC, IRON, RETICCTPCT in the last 72 hours. Sepsis Labs:  Recent Labs Lab 03/24/17 1729 03/25/17 0549  WBC 6.3 5.4   Microbiology No results found for this or any previous visit (from the past 240 hour(s)).   Medications:   . carvedilol  6.25 mg Oral BID WC  . ezetimibe  10 mg Oral Daily  . levothyroxine  100 mcg Oral QAC breakfast  . liothyronine  5 mcg Oral q morning - 10a  . losartan  100 mg Oral QHS  . pantoprazole (PROTONIX) IV  40 mg Intravenous Q12H   Continuous Infusions: . sodium chloride 125 mL/hr at 03/25/17 0553      LOS: 0 days   Charlynne Cousins  [facility] Pager [phone]  *Please refer to [email or web address], password [email or web address] to get updated schedule on who will round on this patient, as hospitalists switch teams weekly. If 7PM-7AM, please contact night-coverage at [email or web address], password [email or web address] for any overnight needs.  03/25/2017, 8:17 AM

## 2017-03-26 ENCOUNTER — Encounter (HOSPITAL_COMMUNITY)
Admission: EM | Disposition: A | Discharge status: Home / Self Care | Payer: Self-pay | Source: Home / Self Care | Attending: Emergency Medicine

## 2017-03-26 ENCOUNTER — Encounter (HOSPITAL_COMMUNITY): Payer: Self-pay

## 2017-03-26 DIAGNOSIS — I480 Paroxysmal atrial fibrillation: Secondary | ICD-10-CM

## 2017-03-26 DIAGNOSIS — K921 Melena: Secondary | ICD-10-CM | POA: Diagnosis not present

## 2017-03-26 DIAGNOSIS — K3189 Other diseases of stomach and duodenum: Secondary | ICD-10-CM

## 2017-03-26 DIAGNOSIS — D62 Acute posthemorrhagic anemia: Secondary | ICD-10-CM

## 2017-03-26 DIAGNOSIS — N183 Chronic kidney disease, stage 3 (moderate): Secondary | ICD-10-CM | POA: Diagnosis not present

## 2017-03-26 HISTORY — PX: ESOPHAGOGASTRODUODENOSCOPY (EGD) WITH PROPOFOL: SHX5813

## 2017-03-26 SURGERY — ESOPHAGOGASTRODUODENOSCOPY (EGD) WITH PROPOFOL
Anesthesia: Moderate Sedation | Laterality: Left

## 2017-03-26 MED ORDER — RIVAROXABAN 20 MG PO TABS: 20.0000 mg | ORAL_TABLET | Freq: Every day | ORAL | 0 refills | Status: DC

## 2017-03-26 MED ORDER — DIPHENHYDRAMINE HCL 50 MG/ML IJ SOLN
INTRAMUSCULAR | Status: DC | PRN
  Administered 2017-03-26: 25 mg via INTRAVENOUS

## 2017-03-26 MED ORDER — SODIUM CHLORIDE 0.9 % IV SOLN: INTRAVENOUS | Status: DC

## 2017-03-26 MED ORDER — MIDAZOLAM HCL 5 MG/ML IJ SOLN
INTRAMUSCULAR | Status: AC
  Filled 2017-03-26: qty 1

## 2017-03-26 MED ORDER — MIDAZOLAM HCL 5 MG/5ML IJ SOLN
INTRAMUSCULAR | Status: DC | PRN
  Administered 2017-03-26 (×3): 1 mg via INTRAVENOUS

## 2017-03-26 MED ORDER — LIP MEDEX EX OINT
TOPICAL_OINTMENT | CUTANEOUS | Status: AC
  Administered 2017-03-26: 1
  Filled 2017-03-26: qty 7

## 2017-03-26 MED ORDER — FENTANYL CITRATE (PF) 100 MCG/2ML IJ SOLN
INTRAMUSCULAR | Status: AC
  Filled 2017-03-26: qty 2

## 2017-03-26 MED ORDER — FENTANYL CITRATE (PF) 100 MCG/2ML IJ SOLN
INTRAMUSCULAR | Status: DC | PRN
  Administered 2017-03-26 (×2): 12.5 ug via INTRAVENOUS

## 2017-03-26 MED ORDER — PANTOPRAZOLE SODIUM 40 MG PO TBEC: 40.0000 mg | DELAYED_RELEASE_TABLET | Freq: Two times a day (BID) | ORAL | 1 refills | Status: DC

## 2017-03-26 MED ORDER — DIPHENHYDRAMINE HCL 50 MG/ML IJ SOLN
INTRAMUSCULAR | Status: AC
  Filled 2017-03-26: qty 1

## 2017-03-26 SURGICAL SUPPLY — 15 items

## 2017-03-26 NOTE — Op Note (Signed)
[facility] Patient Name: [name] Procedure Date: 03/26/2017 MRN: [id number] Attending MD: Arta Silence , MD Date of Birth: [birth year] CSN: [id number] Age: 83 Admit Type: Outpatient Procedure:                Upper GI endoscopy Indications:              Melena Providers:                Arta Silence, MD, Laverta Baltimore RN, RN,                            Maddyx Mulder, Technician Referring MD:              Medicines:                Fentanyl 25 micrograms IV, Midazolam 3 mg IV,                            Diphenhydramine 25 mg IV Complications:            No immediate complications. Estimated Blood Loss:     Estimated blood loss: none. Procedure:                Pre-Anesthesia Assessment:                           - Prior to the procedure, a History and Physical                            was performed, and patient medications and                            allergies were reviewed. The patient's tolerance of                            previous anesthesia was also reviewed. The risks                            and benefits of the procedure and the sedation                            options and risks were discussed with the patient.                            All questions were answered, and informed consent                            was obtained. Prior Anticoagulants: The patient has                            taken Xarelto (rivaroxaban), last dose was 4 days                            prior to procedure. ASA Grade Assessment: III - A  patient with severe systemic disease. After                            reviewing the risks and benefits, the patient was                            deemed in satisfactory condition to undergo the                            procedure.                           After obtaining informed consent, the endoscope was                            passed under direct vision. Throughout the   procedure, the patient's blood pressure, pulse, and                            oxygen saturations were monitored continuously. The                            [id number] [phone]) scope was introduced through the                            mouth, and advanced to the second part of duodenum.                            The upper GI endoscopy was accomplished without                            difficulty. The patient tolerated the procedure                            well. Scope In: Scope Out: Findings:      The examined esophagus was normal.      A few localized, small non-bleeding erosions were found in the       prepyloric region of the stomach. There were no stigmata of recent       bleeding.      The exam of the stomach was otherwise normal.      The duodenal bulb, first portion of the duodenum and second portion of       the duodenum were normal.      No old or fresh blood was seen to the extent of our examination. Impression:               - Normal esophagus.                           - Non-bleeding erosive gastropathy.                           - Normal duodenal bulb, first portion of the                            duodenum and second  portion of the duodenum. Moderate Sedation:      Moderate (conscious) sedation was administered by the endoscopy nurse       and supervised by the endoscopist. The following parameters were       monitored: oxygen saturation, heart rate, blood pressure, and response       to care. Recommendation:           - Return patient to hospital ward for ongoing care.                           - Soft diet today.                           - Continue present medications.                           - Resume Xarelto (rivaroxaban) at prior dose today.                           - Discharge on PPI (e.g., pantoprazole 40 mg po qd)                            and continue on this indefinitely.                           - If bleeding recurs, capsule endoscopy would be                             next best step in management.                           - Ok to discharge home today from GI perspective.                           [facility] will sign-off; please call [phone]                            to arrange follow-up with Dr. Cristina Gong; please call                            with any questions; thank you for the consultation. Procedure Code(s):        --- Professional ---                           [phone], Esophagogastroduodenoscopy, flexible,                            transoral; diagnostic, including collection of                            specimen(s) by brushing or washing, when performed                            (separate procedure) Diagnosis Code(s):        --- Professional ---  K31.89, Other diseases of stomach and duodenum                           K92.1, Melena (includes Hematochezia) CPT copyright 2016 [employer] Association. All rights reserved. The codes documented in this report are preliminary and upon coder review may  be revised to meet current compliance requirements. Arta Silence, MD 03/26/2017 11:05:27 AM This report has been signed electronically. Number of Addenda: 0

## 2017-03-26 NOTE — H&P (View-Only) (Signed)
[facility] Gastroenterology Consultation Note  Referring Provider: Dr. Charlynne Cousins Primary Care Physician:  [name], MD Primary Gastroenterologist:  Dr. [name]   Reason for Consultation:  melena  HPI: [name] is a 83 y.o. male presenting 3 day history melena.  Takes rivaroxaban, last dose couple days ago.  Some mild left upper quadrant discomfort.  No GERD, dysphagia, hematemesis, hematochezia.  Remote history of ulcers years ago.   Past Medical History:  Diagnosis Date  . Anginal pain ([place])   . Anxiety    " OCCASIONAL"  . Arthritis   . Asthma due to seasonal allergies    uses inhalers prn  . Carotid artery occlusion    Left  . Complication of anesthesia    " had tremors after prostate surgery  . Coronary artery disease   . Diverticulitis    recurrent  . Family history of anesthesia complication    MOTHER   . GERD (gastroesophageal reflux disease)   . H/O hiatal hernia   . Hearing aid worn   . Hyperlipemia   . Hypertension   . Kidney stone    lithotripsy 0962 w complications, req stents, Dr Rosana Hoes  . Prostate CA ([place])   . Shortness of breath   . Thyroid disease   . TIA (transient ischemic attack)   . Tinnitus     Past Surgical History:  Procedure Laterality Date  . CARDIAC CATHETERIZATION  2008   minimal dz, Dr Cathie Olden  . CARDIAC CATHETERIZATION N/A 10/09/2015   Procedure: Left Heart Cath and Coronary Angiography;  Surgeon: Peter M Martinique, MD;  Location: [place] CV LAB;  Service: Cardiovascular;  Laterality: N/A;  . CARDIAC CATHETERIZATION N/A 10/09/2015   Procedure: Intravascular Pressure Wire/FFR Study;  Surgeon: Peter M Martinique, MD;  Location: [place] [facility];  Service: Cardiovascular;  Laterality: N/A;  . CARDIAC CATHETERIZATION N/A 10/09/2015   Procedure: Coronary Stent Intervention;  Surgeon: Peter M Martinique, MD;  Location: [place] CV LAB;  Service: Cardiovascular;  Laterality: N/A;  . CAROTID ENDARTERECTOMY  Left   1998  .  CHOLECYSTECTOMY    . CORONARY STENT PLACEMENT  10/09/2015   DES to RCA  . LEFT HEART CATHETERIZATION WITH CORONARY ANGIOGRAM N/A 09/15/2013   Procedure: LEFT HEART CATHETERIZATION WITH CORONARY ANGIOGRAM;  Surgeon: Peter M Martinique, MD;  Location: [facility] CATH LAB;  Service: Cardiovascular;  Laterality: N/A;  . PROSTATECTOMY  1998   radical for prostate cancer    Prior to Admission medications   Medication Sig Start Date End Date Taking? Authorizing Provider  carvedilol (COREG) 6.25 MG tablet TAKE 1 TABLET BY MOUTH TWICE A DAY WITH A MEAL 02/17/17  Yes Copland, Gay Filler, MD  ezetimibe (ZETIA) 10 MG tablet Take 1 tablet (10 mg total) by mouth daily. 09/08/16  Yes Martinique, Peter M, MD  HYDROcodone-acetaminophen (NORCO/VICODIN) 5-325 MG tablet Take 0.5-1 tablets by mouth 2 (two) times daily as needed (pain). 11/23/16  Yes Copland, Gay Filler, MD  hydrocortisone 2.5 % cream Apply 1 application topically 2 (two) times daily.   Yes [provider]  ipratropium (ATROVENT) 0.03 % nasal spray Place 2 sprays into the nose 4 (four) times daily. 05/11/16  Yes Copland, Gay Filler, MD  levothyroxine (SYNTHROID, LEVOTHROID) 100 MCG tablet TAKE 1 TABLET EVERY DAY IN THE MORNING. 12/29/16  Yes Copland, Gay Filler, MD  liothyronine (CYTOMEL) 5 MCG tablet Take 1 tablet (5 mcg total) by mouth every morning. 09/23/16  Yes Copland, Gay Filler, MD  losartan (  COZAAR) 100 MG tablet Take 100 mg by mouth at bedtime.   Yes [provider]  nitroGLYCERIN (NITROLINGUAL) 0.4 MG/SPRAY spray Place 1 spray under the tongue every 5 (five) minutes x 3 doses as needed for chest pain. 03/18/16  Yes Meng, Isaac Laud, PA  polyethylene glycol (MIRALAX / GLYCOLAX) packet Take 17 g by mouth daily. Mix in 8 oz liquid and drink   Yes [provider]  PRESCRIPTION MEDICATION Take 1 capsule by mouth daily. Nystatin/Amphoteracin B   Yes [provider]  Rivaroxaban 15 & 20 MG TBPK Take as directed on package: Start with one 15mg   tablet by mouth twice a day with food. On Day 22, switch to one 20mg  tablet once a day with food. 03/13/17  Yes Daleen Bo, MD  triamcinolone cream (KENALOG) 0.1 % Apply 1 application topically daily as needed (rash).   Yes [provider]  VENTOLIN HFA 108 (90 Base) MCG/ACT inhaler INHALE 2 PUFFS INTO THE LUNGS EVERY 6 HRS AS NEEDED FOR WHEEZING OR SHORTNESS OF BREATH 10/23/16  Yes Copland, Gay Filler, MD  Artificial Tear Solution (BION TEARS OP) Place 1 drop into both eyes daily as needed (dry eyes).    [provider]  aspirin 81 MG tablet Take 1 tablet (81 mg total) by mouth daily. 10/10/15   Arbutus Leas, NP    Current Facility-Administered Medications  Medication Dose Route Frequency Provider Last Rate Last Dose  . 0.9 %  sodium chloride infusion   Intravenous Continuous Charlynne Cousins, MD   Stopped at 03/25/17 1158  . carvedilol (COREG) tablet 6.25 mg  6.25 mg Oral BID WC Edwin Dada, MD   6.25 mg at 03/25/17 0857  . ezetimibe (ZETIA) tablet 10 mg  10 mg Oral Daily Edwin Dada, MD   10 mg at 03/25/17 0857  . levothyroxine (SYNTHROID, LEVOTHROID) tablet 100 mcg  100 mcg Oral QAC breakfast Edwin Dada, MD   100 mcg at 03/25/17 0857  . liothyronine (CYTOMEL) tablet 5 mcg  5 mcg Oral q morning - 10a Danford, Suann Larry, MD   5 mcg at 03/25/17 0857  . losartan (COZAAR) tablet 100 mg  100 mg Oral QHS Edwin Dada, MD   100 mg at 03/25/17 0010  . ondansetron (ZOFRAN) tablet 4 mg  4 mg Oral Q6H PRN Danford, Suann Larry, MD       Or  . ondansetron (ZOFRAN) injection 4 mg  4 mg Intravenous Q6H PRN Danford, Suann Larry, MD      . pantoprazole (PROTONIX) injection 40 mg  40 mg Intravenous Q12H Danford, Suann Larry, MD   40 mg at 03/25/17 0536  . polyethylene glycol (MIRALAX / GLYCOLAX) packet 17 g  17 g Oral Daily PRN Edwin Dada, MD        Allergies as of 03/24/2017 - Review Complete 03/24/2017  Allergen  Reaction Noted  . Ciprofloxacin Other (See Comments) 07/19/2014  . Codeine Other (See Comments) 07/19/2014  . Flexeril [cyclobenzaprine]  09/25/2015  . Methocarbamol  09/25/2015  . Sulfa antibiotics Other (See Comments) 11/05/2015  . Sulfamethoxazole Other (See Comments) 01/27/2012  . Sulfonamide derivatives Other (See Comments)   . Flagyl [metronidazole] Rash 08/09/2015    Family History  Problem Relation Age of Onset  . Heart disease Mother   . Heart disease Father   . Kidney failure Father   . Heart disease Sister   . Heart attack Sister   . Dementia Sister   .  Sjogren's syndrome Child   . Hashimoto's thyroiditis Child   . CAD Child     Social History   Social History  . Marital status: Married    Spouse name: N/A  . Number of children: N/A  . Years of education: N/A   Occupational History  . retired     Actor [employer]   Social History Main Topics  . Smoking status: Former Smoker    Packs/day: 2.50    Years: 30.00    Types: Cigarettes    Quit date: 08/28/1985  . Smokeless tobacco: Never Used  . Alcohol use 1.2 oz/week    2 Standard drinks or equivalent per week     Comment: 1 glass wine/week (prior 1 glass martini with dinner)  . Drug use: No  . Sexual activity: Yes    Birth control/ protection: None   Other Topics Concern  . Not on file   Social History Narrative   Lives with wife--recently diagnosed with breast ca; No smoking; Occassional wine; Retired; 2 daughters live in [place]--5 grandchildren(boys). On weight watchers. Lives in [place] with wife. Retired Solicitor. Tobacco history 2 ppd x 32 years. No smoking x 25 years. No drugs.    Review of Systems: As per HPI, all others negative  Physical Exam: Vital signs in last 24 hours: Temp:  [97.5 F (36.4 C)-98.5 F (36.9 C)] 97.7 F (36.5 C) (09/13 1311) Pulse Rate:  [52-63] 52 (09/13 1311) Resp:  [12-21] 18 (09/13 1311) BP: (128-175)/(62-82) 155/68 (09/13  1311) SpO2:  [96 %-100 %] 97 % (09/13 1311) Weight:  [221 lb 1.9 oz (100.3 kg)] 221 lb 1.9 oz (100.3 kg) (09/12 1653) Last BM Date: 03/23/17 General:   Alert, obese, younger-appearing than stated age, pleasant and cooperative in NAD Head:  Normocephalic and atraumatic. Eyes:  Sclera clear, no icterus.   Conjunctiva pink. Ears:  Mild presbyacusis Nose:  No deformity, discharge,  or lesions. Mouth:  No deformity or lesions.  Oropharynx pink & moist. Neck:  Thick but supple; no masses or thyromegaly. Abdomen:  Soft, protuberant, non-tender. No masses, hepatosplenomegaly or hernias noted. Normal bowel sounds, without guarding, and without rebound.     Msk:  Symmetrical without gross deformities. Normal posture. Pulses:  Normal pulses noted. Extremities:  Without clubbing or edema. Neurologic:  Alert and  oriented x4; diffusely weak, otherwise grossly normal neurologically. Skin:  Intact without significant lesions or rashes. Psych:  Alert and cooperative. Normal mood and affect.   Lab Results:  Recent Labs  03/24/17 1729 03/25/17 0549  WBC 6.3 5.4  HGB 12.5* 12.2*  HCT 35.2* 34.2*  PLT 139* 121*   BMET  Recent Labs  03/24/17 1729 03/25/17 0549  NA 138 139  K 4.8 3.9  CL 103 104  CO2 28 28  GLUCOSE 102* 97  BUN 23* 21*  CREATININE 1.59* 1.66*  CALCIUM 9.4 8.8*   LFT  Recent Labs  03/24/17 1729  PROT 6.8  ALBUMIN 3.9  AST 26  ALT 27  ALKPHOS 50  BILITOT 1.5*   PT/INR No results for input(s): LABPROT, INR in the last 72 hours.  Studies/Results: No results found.  Impression:  1.  Melena for a few days, non-destabilizing. 2.  Acute blood loss anemia, mild. 3.  Chronic anticoagulation, rivaroxaban, off for 2+ days. 4.  Remote history of peptic ulcer.  Plan:  1.  PPI. 2.  Continue to hold rivaroxaban. 3.  Soft diet tonight, NPO after midnight. 4.  Endoscopy  tomorrow. 5.  Risks (bleeding, infection, bowel perforation that could require surgery,  sedation-related changes in cardiopulmonary systems), benefits (identification and possible treatment of source of symptoms, exclusion of certain causes of symptoms), and alternatives (watchful waiting, radiographic imaging studies, empiric medical treatment) of upper endoscopy (EGD) were explained to patient/family in detail and patient wishes to proceed.   LOS: 0 days   Keria Widrig M  03/25/2017, 3:42 PM  Cell [phone] If no answer or after 5 PM call [phone]

## 2017-03-26 NOTE — Interval H&P Note (Signed)
History and Physical Interval Note:  03/26/2017 10:29 AM  [name]  has presented today for surgery, with the diagnosis of Melena, acute blood loss anemia  The various methods of treatment have been discussed with the patient and family. After consideration of risks, benefits and other options for treatment, the patient has consented to  Procedure(s): ESOPHAGOGASTRODUODENOSCOPY (EGD) WITH PROPOFOL (Left) as a surgical intervention .  The patient's history has been reviewed, patient examined, no change in status, stable for surgery.  I have reviewed the patient's chart and labs.  Questions were answered to the patient's satisfaction.     Landry Dyke

## 2017-03-26 NOTE — Discharge Summary (Signed)
Physician Discharge Summary  [name] NLZ:[id number] DOB: [birth year] DOA: 03/24/2017  PCP: [name], MD  Admit date: 03/24/2017 Discharge date: 03/26/2017  Admitted From: home Disposition:  Home  Recommendations for Outpatient Follow-up:  1. Follow up with PCP in 1-2 weeks 2. Please obtain BMP/CBC in one week   Home Health:No Equipment/Devices:none  Discharge Condition:stable CODE STATUS:full Diet recommendation: Heart Healthy   Brief/Interim Summary: 83 y.o. male past medical history significant for hypertension coronary artery sees with a drug-eluting stent in 2015 recent new onset atrial fibrillation on Xarelto who presents with melena.  Discharge Diagnoses:  Principal Problem:   Melena Active Problems:   Hypothyroidism, acquired   Coronary artery disease due to lipid rich plaque   Essential hypertension   CKD (chronic kidney disease), stage III   AF (paroxysmal atrial fibrillation) (HCC)   Acute blood loss anemia  Melanotic stools due to nonbleeding erosive gastropathy: Xarelto was held on admission he was on IV PPI twice a day patient nothing by mouth GI was consulted recommended an endoscopy this was done that showed erosive gastritis. They recommended to continue Protonix twice a day indefinitely. And restart Xarelto tomorrow morning.  Hypothyroidism: Continue Synthroid.  Essential hypertension: No change images medication.  Chronic atrial fibrillation CHADS2-VASc 6. Resume Xarelto tomorrow morning.  Chronic kidney disease stage III: Creatinine remained at baseline.   Discharge Instructions  Discharge Instructions    Diet - low sodium heart healthy    Complete by:  As directed    Increase activity slowly    Complete by:  As directed      Allergies as of 03/26/2017      Reactions   Ciprofloxacin Other (See Comments)   Hallucinations or jittery Hallucinations or jittery   Codeine Other (See Comments)   hallucinations hallucinations    Flexeril [cyclobenzaprine]    Methocarbamol    Sulfa Antibiotics Other (See Comments)   Chills and shaking "serum sickness" Chills and shaking "serum sickness"   Sulfamethoxazole Other (See Comments)   UNKNOWN REACTION, NOT DOCUMENTED   Sulfonamide Derivatives Other (See Comments)   Chills and shaking "serum sickness"   Flagyl [metronidazole] Rash      Medication List    STOP taking these medications   Rivaroxaban 15 & 20 MG Tbpk Replaced by:  rivaroxaban 20 MG Tabs tablet     TAKE these medications   aspirin 81 MG tablet Take 1 tablet (81 mg total) by mouth daily.   BION TEARS OP Place 1 drop into both eyes daily as needed (dry eyes).   carvedilol 6.25 MG tablet Commonly known as:  COREG TAKE 1 TABLET BY MOUTH TWICE A DAY WITH A MEAL   ezetimibe 10 MG tablet Commonly known as:  ZETIA Take 1 tablet (10 mg total) by mouth daily.   HYDROcodone-acetaminophen 5-325 MG tablet Commonly known as:  NORCO/VICODIN Take 0.5-1 tablets by mouth 2 (two) times daily as needed (pain).   hydrocortisone 2.5 % cream Apply 1 application topically 2 (two) times daily.   ipratropium 0.03 % nasal spray Commonly known as:  ATROVENT Place 2 sprays into the nose 4 (four) times daily.   levothyroxine 100 MCG tablet Commonly known as:  SYNTHROID, LEVOTHROID TAKE 1 TABLET EVERY DAY IN THE MORNING.   liothyronine 5 MCG tablet Commonly known as:  CYTOMEL Take 1 tablet (5 mcg total) by mouth every morning.   losartan 100 MG tablet Commonly known as:  COZAAR Take 100 mg by mouth at bedtime.  nitroGLYCERIN 0.4 MG/SPRAY spray Commonly known as:  NITROLINGUAL Place 1 spray under the tongue every 5 (five) minutes x 3 doses as needed for chest pain.   pantoprazole 40 MG tablet Commonly known as:  PROTONIX Take 1 tablet (40 mg total) by mouth 2 (two) times daily.   polyethylene glycol packet Commonly known as:  MIRALAX / GLYCOLAX Take 17 g by mouth daily. Mix in 8 oz liquid and  drink   PRESCRIPTION MEDICATION Take 1 capsule by mouth daily. Nystatin/Amphoteracin B   rivaroxaban 20 MG Tabs tablet Commonly known as:  XARELTO Take 1 tablet (20 mg total) by mouth daily with supper. Replaces:  Rivaroxaban 15 & 20 MG Tbpk   triamcinolone cream 0.1 % Commonly known as:  KENALOG Apply 1 application topically daily as needed (rash).   VENTOLIN HFA 108 (90 Base) MCG/ACT inhaler Generic drug:  albuterol INHALE 2 PUFFS INTO THE LUNGS EVERY 6 HRS AS NEEDED FOR WHEEZING OR SHORTNESS OF BREATH            Discharge Care Instructions        Start     Ordered   03/27/17 0000  rivaroxaban (XARELTO) 20 MG TABS tablet  (rivaroxaban (XARELTO) for Nonvalvular Atrial Fibrillation)  Daily with supper     03/26/17 1236   03/26/17 0000  pantoprazole (PROTONIX) 40 MG tablet  2 times daily     03/26/17 1236   03/26/17 0000  Increase activity slowly     03/26/17 1236   03/26/17 0000  Diet - low sodium heart healthy     03/26/17 1236      Allergies  Allergen Reactions  . Ciprofloxacin Other (See Comments)    Hallucinations or jittery Hallucinations or jittery  . Codeine Other (See Comments)    hallucinations hallucinations  . Flexeril [Cyclobenzaprine]   . Methocarbamol   . Sulfa Antibiotics Other (See Comments)    Chills and shaking "serum sickness" Chills and shaking "serum sickness"  . Sulfamethoxazole Other (See Comments)    UNKNOWN REACTION, NOT DOCUMENTED  . Sulfonamide Derivatives Other (See Comments)    Chills and shaking "serum sickness"  . Flagyl [Metronidazole] Rash    Consultations:  GI   Procedures/Studies: Dg Chest 2 View  Result Date: 03/13/2017 CLINICAL DATA:  83 y/o  M; chest pain. EXAM: CHEST  2 VIEW COMPARISON:  03/24/2015 chest radiographs FINDINGS: Normal cardiac silhouette. Aortic atherosclerosis with calcification. Stable coarse lung markings in lung bases, likely chronic bronchitic changes No pleural effusion or pneumothorax.  Moderate multilevel degenerative changes of the spine. IMPRESSION: Stable chronic bronchitic changes in lung bases. No focal consolidation. Aortic atherosclerosis. Electronically Signed   By: Kristine Garbe M.D.   On: 03/13/2017 05:18     Subjective: No new complaints.   Discharge Exam: Vitals:   03/26/17 1150 03/26/17 1209  BP: (!) 140/99 (!) 147/52  Pulse: 61 (!) 51  Resp: 16 15  Temp:  97.6 F (36.4 C)  SpO2: 99% 100%   Vitals:   03/26/17 1130 03/26/17 1140 03/26/17 1150 03/26/17 1209  BP: (!) 152/59 (!) 154/66 (!) 140/99 (!) 147/52  Pulse: (!) 56 62 61 (!) 51  Resp: 16 16 16 15   Temp:    97.6 F (36.4 C)  TempSrc:    Oral  SpO2: 98% 96% 99% 100%  Weight:      Height:        General: Pt is alert, awake, not in acute distress Cardiovascular: RRR, S1/S2 +, no rubs, no  gallops Respiratory: CTA bilaterally, no wheezing, no rhonchi Abdominal: Soft, NT, ND, bowel sounds + Extremities: no edema, no cyanosis    The results of significant diagnostics from this hospitalization (including imaging, microbiology, ancillary and laboratory) are listed below for reference.     Microbiology: No results found for this or any previous visit (from the past 240 hour(s)).   Labs: BNP (last 3 results) No results for input(s): BNP in the last 8760 hours. Basic Metabolic Panel:  Recent Labs Lab 03/24/17 1729 03/25/17 0549  NA 138 139  K 4.8 3.9  CL 103 104  CO2 28 28  GLUCOSE 102* 97  BUN 23* 21*  CREATININE 1.59* 1.66*  CALCIUM 9.4 8.8*   Liver Function Tests:  Recent Labs Lab 03/24/17 1729  AST 26  ALT 27  ALKPHOS 50  BILITOT 1.5*  PROT 6.8  ALBUMIN 3.9   No results for input(s): LIPASE, AMYLASE in the last 168 hours. No results for input(s): AMMONIA in the last 168 hours. CBC:  Recent Labs Lab 03/24/17 1729 03/25/17 0549  WBC 6.3 5.4  NEUTROABS 2.6  --   HGB 12.5* 12.2*  HCT 35.2* 34.2*  MCV 100.3* 99.1  PLT 139* 121*   Cardiac  Enzymes: No results for input(s): CKTOTAL, CKMB, CKMBINDEX, TROPONINI in the last 168 hours. BNP: Invalid input(s): POCBNP CBG: No results for input(s): GLUCAP in the last 168 hours. D-Dimer No results for input(s): DDIMER in the last 72 hours. Hgb A1c No results for input(s): HGBA1C in the last 72 hours. Lipid Profile No results for input(s): CHOL, HDL, LDLCALC, TRIG, CHOLHDL, LDLDIRECT in the last 72 hours. Thyroid function studies No results for input(s): TSH, T4TOTAL, T3FREE, THYROIDAB in the last 72 hours.  Invalid input(s): FREET3 Anemia work up No results for input(s): VITAMINB12, FOLATE, FERRITIN, TIBC, IRON, RETICCTPCT in the last 72 hours. Urinalysis    Component Value Date/Time   COLORURINE YELLOW 03/24/2015 0525   APPEARANCEUR CLEAR 03/24/2015 0525   LABSPEC 1.013 03/24/2015 0525   PHURINE 5.0 03/24/2015 0525   GLUCOSEU NEGATIVE 03/24/2015 0525   HGBUR TRACE (A) 03/24/2015 0525   BILIRUBINUR NEGATIVE 03/24/2015 0525   BILIRUBINUR negative 01/30/2013 0948   KETONESUR NEGATIVE 03/24/2015 0525   PROTEINUR NEGATIVE 03/24/2015 0525   UROBILINOGEN 0.2 03/24/2015 0525   NITRITE NEGATIVE 03/24/2015 0525   LEUKOCYTESUR NEGATIVE 03/24/2015 0525   Sepsis Labs Invalid input(s): PROCALCITONIN,  WBC,  LACTICIDVEN Microbiology No results found for this or any previous visit (from the past 240 hour(s)).   Time coordinating discharge: Over 30 minutes  SIGNED:   Charlynne Cousins, MD  [facility] 03/26/2017, 12:36 PM Pager   If 7PM-7AM, please contact night-coverage [email or web address] Password [email or web address]

## 2017-03-29 ENCOUNTER — Encounter (HOSPITAL_COMMUNITY): Payer: Self-pay | Admitting: Gastroenterology

## 2017-03-29 ENCOUNTER — Telehealth: Payer: Self-pay | Admitting: Behavioral Health

## 2017-03-29 NOTE — Telephone Encounter (Signed)
Transition Care Management Follow-up Telephone Call  PCP: [name], MD  Admit date: 03/24/2017 Discharge date: 03/26/2017  Admitted From: home Disposition:  Home  Recommendations for Outpatient Follow-up:  1. Follow up with PCP in 1-2 weeks 2. Please obtain BMP/CBC in one week   Home Health:No Equipment/Devices:none  Discharge Condition:stable   How have you been since you were released from the hospital? Patient voiced that he feels weak & tired today.   Do you understand why you were in the hospital? yes, patient stated that he "had internal bleeding".   Do you understand the discharge instructions? yes   Where were you discharged to? Home with wife.   Items Reviewed:  Medications reviewed: yes  Allergies reviewed: yes  Dietary changes reviewed: yes, patient voiced that he's consuming a regular diet.  Referrals reviewed: yes, Follow up with PCP in 1-2 weeks.   Functional Questionnaire:   Activities of Daily Living (ADLs):   He states they are independent in the following: ambulation, bathing and hygiene, feeding, continence, grooming, toileting and dressing States they require assistance with the following: None   Any transportation issues/concerns?: no   Any patient concerns? yes, patient is concerned about feeling too tired & dizzy.   Confirmed importance and date/time of follow-up visits scheduled yes, 04/05/17 at 11:30 AM.  Provider Appointment booked with Dr. Lorelei Pont.  Confirmed with patient if condition begins to worsen call PCP or go to the ER.  Patient was given the office number and encouraged to call back with question or concerns.  : yes

## 2017-03-29 NOTE — Telephone Encounter (Signed)
Per patient's wife, he's asleep at this time, but she will have him to call the office back once he's awakened.

## 2017-03-31 ENCOUNTER — Other Ambulatory Visit: Payer: Self-pay | Admitting: Family

## 2017-03-31 DIAGNOSIS — B372 Candidiasis of skin and nail: Secondary | ICD-10-CM

## 2017-04-01 ENCOUNTER — Encounter: Payer: Self-pay | Admitting: Family Medicine

## 2017-04-03 NOTE — Progress Notes (Addendum)
[place] [facility] [address] [place], [place] [address] [phone]  Date:  04/05/2017   Name:  [name]   DOB:  December 26, 1933   MRN:  [id number]  PCP:  [name], MD    Chief Complaint: Hospitalization Follow-up (Pt here for hosp f/u visit. )   History of Present Illness:  [name] is a 83 y.o. very pleasant male patient who presents with the following:  Here today for a hospital follow-up visit:  Admit date: 03/24/2017 Discharge date: 03/26/2017  Recommendations for Outpatient Follow-up:  1. Follow up with PCP in 1-2 weeks 2. Please obtain BMP/CBC in one week  Discharge Condition:stable CODE STATUS:full Diet recommendation: Heart Healthy   Brief/Interim Summary: 83 y.o.malepast medical history significant for hypertension coronary artery sees with a drug-eluting stent in 2015 recent new onset atrial fibrillation on Xarelto who presents with melena.  Discharge Diagnoses:  Principal Problem:   Melena Active Problems:   Hypothyroidism, acquired   Coronary artery disease due to lipid rich plaque   Essential hypertension   CKD (chronic kidney disease), stage III   AF (paroxysmal atrial fibrillation) (HCC)   Acute blood loss anemia  Melanotic stools due to nonbleeding erosive gastropathy: Xarelto was held on admission he was on IV PPI twice a day patient nothing by mouth GI was consulted recommended an endoscopy this was done that showed erosive gastritis. They recommended to continue Protonix twice a day indefinitely. And restart Xarelto tomorrow morning.  Hypothyroidism: Continue Synthroid.  Essential hypertension: No change images medication.  Chronic atrial fibrillation CHADS2-VASc 6. Resume Xarelto tomorrow morning.  Chronic kidney disease stage III: Creatinine remained at baseline.  Since he got home, he is feeling just fine.  However, he was not able to fill the protonix  for some reason- Suspect the 40 BID was not covered.  They did not get this medication and he is not taking any PPI at this time  He has used a little apple cider vinegar  He is back on his xarelto- no black stools or bleeding.  He will bruise or bleed easily but this is nothing new for him. No change in this respect  He is not taking asa 81 right now- was not sure if he should take this or not. He is seeing cardiology in 2 days and will ask them He is eating ok No fever   He requests, in addition to the TSH, the following thyroid labs. He uses some type of thyroid book to monitor his thyroid progress at home.  He have discussed this and he knows that I may not manage thyroid the same as a previous thyroid doctor he had in [facility] and Free T4  Reverse T3  Thyroid antibodies   Lab Results  Component Value Date   TSH 4.85 (H) 04/05/2017     Patient Active Problem List   Diagnosis Date Noted  . Erosive gastropathy 03/26/2017  . Acute blood loss anemia 03/25/2017  . Melena 03/24/2017  . CKD (chronic kidney disease), stage III 03/24/2017  . AF (paroxysmal atrial fibrillation) ([place]) 03/24/2017  . Angina pectoris ([place]) 10/09/2015  . Coronary artery disease due to lipid rich plaque   . Essential hypertension   . Hyperlipemia   . Carotid artery occlusion   . Dizziness 08/09/2015  . Gait instability 05/30/2015  . Myalgia   . Weakness   . Bloating   . Chest pain  at rest 03/24/2015  . Atypical chest pain 03/24/2015  . Fatigue 03/14/2015  . Hypothyroidism, acquired 10/26/2013  . OSA (obstructive sleep apnea) 10/18/2013  . Well adult health check 01/02/2013  . Other symptoms involving urinary system(788.99) 12/21/2012  . Benign localized hyperplasia of prostate with urinary obstruction and other lower urinary tract symptoms (LUTS)(600.21) 12/21/2012  . Carcinoma in situ of prostate 12/21/2012  . Left shoulder pain 08/08/2012  . Obesity 07/24/2011  . Generalized anxiety  disorder 07/24/2011  . DIVERTICULOSIS OF COLON 12/25/2009  . DERMATITIS, SEBORRHEIC 12/25/2009  . 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 Memory loss 08/14/2009  . BACK PAIN, LUMBAR 07/19/2009  . VENOUS INSUFFICIENCY, CHRONIC 09/09/2006  . GASTROESOPHAGEAL REFLUX, NO ESOPHAGITIS 09/09/2006  . HERNIA, HIATAL, NONCONGENITAL 09/09/2006  . INSOMNIA NOS 09/09/2006    Past Medical History:  Diagnosis Date  . Anginal pain ([place])   . Anxiety    " OCCASIONAL"  . Arthritis   . Asthma due to seasonal allergies    uses inhalers prn  . Carotid artery occlusion    Left  . Complication of anesthesia    " had tremors after prostate surgery  . Coronary artery disease   . Diverticulitis    recurrent  . Family history of anesthesia complication    MOTHER   . GERD (gastroesophageal reflux disease)   . H/O hiatal hernia   . Hearing aid worn   . Hyperlipemia   . Hypertension   . Kidney stone    lithotripsy 5621 w complications, req stents, Dr Rosana Hoes  . Prostate CA ([place])   . Shortness of breath   . Thyroid disease   . TIA (transient ischemic attack)   . Tinnitus     Past Surgical History:  Procedure Laterality Date  . CARDIAC CATHETERIZATION  2008   minimal dz, Dr Cathie Olden  . CARDIAC CATHETERIZATION N/A 10/09/2015   Procedure: Left Heart Cath and Coronary Angiography;  Surgeon: Peter M Martinique, MD;  Location: [facility];  Service: Cardiovascular;  Laterality: N/A;  . CARDIAC CATHETERIZATION N/A 10/09/2015   Procedure: Intravascular Pressure Wire/FFR Study;  Surgeon: Peter M Martinique, MD;  Location: [facility];  Service: Cardiovascular;  Laterality: N/A;  . CARDIAC CATHETERIZATION N/A 10/09/2015   Procedure: Coronary Stent Intervention;  Surgeon: Peter M Martinique, MD;  Location: [facility];  Service: Cardiovascular;  Laterality: N/A;  . CAROTID ENDARTERECTOMY  Left   1998  . CHOLECYSTECTOMY    . CORONARY STENT PLACEMENT  10/09/2015   DES to RCA  .  ESOPHAGOGASTRODUODENOSCOPY (EGD) WITH PROPOFOL Left 03/26/2017   Procedure: ESOPHAGOGASTRODUODENOSCOPY (EGD) WITH PROPOFOL;  Surgeon: Arta Silence, MD;  Location: [facility];  Service: Endoscopy;  Laterality: Left;  . LEFT HEART CATHETERIZATION WITH CORONARY ANGIOGRAM N/A 09/15/2013   Procedure: LEFT HEART CATHETERIZATION WITH CORONARY ANGIOGRAM;  Surgeon: Peter M Martinique, MD;  Location: [facility];  Service: Cardiovascular;  Laterality: N/A;  . PROSTATECTOMY  1998   radical for prostate cancer    Social History  Substance Use Topics  . Smoking status: Former Smoker    Packs/day: 2.50    Years: 30.00    Types: Cigarettes    Quit date: 08/28/1985  . Smokeless tobacco: Never Used  . Alcohol use 1.2 oz/week    2 Standard drinks or equivalent per week     Comment: 1 glass wine/week (prior 1 glass martini with dinner)    Family History  Problem Relation Age of Onset  . Heart disease  Mother   . Heart disease Father   . Kidney failure Father   . Heart disease Sister   . Heart attack Sister   . Dementia Sister   . Sjogren's syndrome Child   . Hashimoto's thyroiditis Child   . CAD Child     Allergies  Allergen Reactions  . Ciprofloxacin Other (See Comments)    Hallucinations or jittery Hallucinations or jittery  . Codeine Other (See Comments)    hallucinations hallucinations  . Flexeril [Cyclobenzaprine]   . Methocarbamol   . Sulfa Antibiotics Other (See Comments)    Chills and shaking "serum sickness" Chills and shaking "serum sickness"  . Sulfamethoxazole Other (See Comments)    UNKNOWN REACTION, NOT DOCUMENTED  . Sulfonamide Derivatives Other (See Comments)    Chills and shaking "serum sickness"  . Flagyl [Metronidazole] Rash    Medication list has been reviewed and updated.  Current Outpatient Prescriptions on File Prior to Visit  Medication Sig Dispense Refill  . Artificial Tear Solution (BION TEARS OP) Place 1 drop into both eyes daily as needed (dry eyes).     Marland Kitchen aspirin 81 MG tablet Take 1 tablet (81 mg total) by mouth daily. 30 tablet 12  . carvedilol (COREG) 6.25 MG tablet TAKE 1 TABLET BY MOUTH TWICE A DAY WITH A MEAL 180 tablet 3  . ezetimibe (ZETIA) 10 MG tablet Take 1 tablet (10 mg total) by mouth daily. 90 tablet 3  . HYDROcodone-acetaminophen (NORCO/VICODIN) 5-325 MG tablet Take 0.5-1 tablets by mouth 2 (two) times daily as needed (pain). 60 tablet 0  . hydrocortisone 2.5 % cream Apply 1 application topically 2 (two) times daily.    Marland Kitchen ipratropium (ATROVENT) 0.03 % nasal spray Place 2 sprays into the nose 4 (four) times daily. 30 mL 6  . levothyroxine (SYNTHROID, LEVOTHROID) 100 MCG tablet TAKE 1 TABLET EVERY DAY IN THE MORNING. 90 tablet 1  . liothyronine (CYTOMEL) 5 MCG tablet Take 1 tablet (5 mcg total) by mouth every morning. 90 tablet 3  . losartan (COZAAR) 100 MG tablet Take 100 mg by mouth at bedtime.    . nitroGLYCERIN (NITROLINGUAL) 0.4 MG/SPRAY spray Place 1 spray under the tongue every 5 (five) minutes x 3 doses as needed for chest pain. 12 g 12  . polyethylene glycol (MIRALAX / GLYCOLAX) packet Take 17 g by mouth daily. Mix in 8 oz liquid and drink    . rivaroxaban (XARELTO) 20 MG TABS tablet Take 1 tablet (20 mg total) by mouth daily with supper. 30 tablet 0  . triamcinolone cream (KENALOG) 0.1 % Apply 1 application topically daily as needed (rash).    . VENTOLIN HFA 108 (90 Base) MCG/ACT inhaler INHALE 2 PUFFS INTO THE LUNGS EVERY 6 HRS AS NEEDED FOR WHEEZING OR SHORTNESS OF BREATH 18 Inhaler 0   No current facility-administered medications on file prior to visit.     Review of Systems:  As per HPI- otherwise negative. No rash No vomiting or diarrhea   Physical Examination: Vitals:   04/05/17 1137  BP: 110/82  Pulse: (!) 57  Temp: 98.4 F (36.9 C)  SpO2: 96%   Vitals:   04/05/17 1137  Weight: 219 lb 6.4 oz (99.5 kg)  Height: 5\' 7"  (1.702 m)   Body mass index is 34.36 kg/m. Ideal Body Weight: Weight in (lb) to  have BMI = 25: 159.3  GEN: WDWN, NAD, Non-toxic, A & O x 3, overweight, looks well HEENT: Atraumatic, Normocephalic. Neck supple. No masses, No LAD. Ears  and Nose: No external deformity. CV: RRR, No M/G/R. No JVD. No thrill. No extra heart sounds. PULM: CTA B, no wheezes, crackles, rhonchi. No retractions. No resp. distress. No accessory muscle use. EXTR: No c/c/e NEURO Normal gait.  PSYCH: Normally interactive. Conversant. Not depressed or anxious appearing.  Calm demeanor.    Assessment and Plan: Hospital discharge follow-up  Acute blood loss anemia - Plan: CBC, Basic metabolic panel  Erosive gastritis - Plan: pantoprazole (PROTONIX) 40 MG tablet  Hypothyroidism due to acquired atrophy of thyroid - Plan: TSH, T3, free, T4, free, T3, reverse  Essential hypertension  Chronic renal impairment, unspecified CKD stage  Here today to follow-up from recent hospital stay from GI bleed He is back on xaretlo, is NOT taking PPI as he could not get it filled.  Re-rx this today, advised them to contact me promptly if not able to get this filled BP is under ok control with current medications No further sx of GI bleed- will repeat his CBC and BMP today thyroid labs pending as above  Chronic renal insuf- await repeat creat, his nephrology appt is pending   Signed Lamar Blinks, MD Received labs so far 9/25  Results for orders placed or performed in visit on 04/05/17  CBC  Result Value Ref Range   WBC 5.0 4.0 - 10.5 K/uL   RBC 3.87 (L) 4.22 - 5.81 Mil/uL   Platelets 150.0 150.0 - 400.0 K/uL   Hemoglobin 13.7 13.0 - 17.0 g/dL   HCT 40.2 39.0 - 52.0 %   MCV 104.0 (H) 78.0 - 100.0 fl   MCHC 34.1 30.0 - 36.0 g/dL   RDW 14.6 11.5 - 16.1 %  Basic metabolic panel  Result Value Ref Range   Sodium 141 135 - 145 mEq/L   Potassium 4.6 3.5 - 5.1 mEq/L   Chloride 104 96 - 112 mEq/L   CO2 28 19 - 32 mEq/L   Glucose, Bld 111 (H) 70 - 99 mg/dL   BUN 21 6 - 23 mg/dL   Creatinine, Ser 1.64  (H) 0.40 - 1.50 mg/dL   Calcium 9.6 8.4 - 10.5 mg/dL   GFR 43.00 (L) >60.00 mL/min  TSH  Result Value Ref Range   TSH 4.85 (H) 0.35 - 4.50 uIU/mL  T3, free  Result Value Ref Range   T3, Free 2.9 2.3 - 4.2 pg/mL  T4, free  Result Value Ref Range   Free T4 0.87 0.60 - 1.60 ng/dL   Await reverse T3 Message to pt via mychart

## 2017-04-05 ENCOUNTER — Ambulatory Visit: Payer: Medicare Other | Admitting: Hematology & Oncology

## 2017-04-05 ENCOUNTER — Ambulatory Visit (INDEPENDENT_AMBULATORY_CARE_PROVIDER_SITE_OTHER): Payer: Medicare Other | Admitting: Family Medicine

## 2017-04-05 ENCOUNTER — Other Ambulatory Visit: Payer: Medicare Other

## 2017-04-05 VITALS — BP 110/82 | HR 57 | Temp 98.4°F | Ht 67.0 in | Wt 219.4 lb

## 2017-04-05 DIAGNOSIS — E034 Atrophy of thyroid (acquired): Secondary | ICD-10-CM | POA: Diagnosis not present

## 2017-04-05 DIAGNOSIS — K2960 Other gastritis without bleeding: Secondary | ICD-10-CM

## 2017-04-05 DIAGNOSIS — I10 Essential (primary) hypertension: Secondary | ICD-10-CM | POA: Diagnosis not present

## 2017-04-05 DIAGNOSIS — N189 Chronic kidney disease, unspecified: Secondary | ICD-10-CM

## 2017-04-05 DIAGNOSIS — D62 Acute posthemorrhagic anemia: Secondary | ICD-10-CM

## 2017-04-05 DIAGNOSIS — Z09 Encounter for follow-up examination after completed treatment for conditions other than malignant neoplasm: Secondary | ICD-10-CM

## 2017-04-05 LAB — CBC
HEMATOCRIT: 40.2 % (ref 39.0–52.0)
Hemoglobin: 13.7 g/dL (ref 13.0–17.0)
MCHC: 34.1 g/dL (ref 30.0–36.0)
MCV: 104 fl — ABNORMAL HIGH (ref 78.0–100.0)
Platelets: 150 10*3/uL (ref 150.0–400.0)
RBC: 3.87 Mil/uL — ABNORMAL LOW (ref 4.22–5.81)
RDW: 14.6 % (ref 11.5–15.5)
WBC: 5 10*3/uL (ref 4.0–10.5)

## 2017-04-05 LAB — T4, FREE: FREE T4: 0.87 ng/dL (ref 0.60–1.60)

## 2017-04-05 LAB — BASIC METABOLIC PANEL
BUN: 21 mg/dL (ref 6–23)
CALCIUM: 9.6 mg/dL (ref 8.4–10.5)
CO2: 28 meq/L (ref 19–32)
Chloride: 104 mEq/L (ref 96–112)
Creatinine, Ser: 1.64 mg/dL — ABNORMAL HIGH (ref 0.40–1.50)
GFR: 43 mL/min — AB (ref >60.00)
Glucose, Bld: 111 mg/dL — ABNORMAL HIGH (ref 70–99)
Potassium: 4.6 mEq/L (ref 3.5–5.1)
SODIUM: 141 meq/L (ref 135–145)

## 2017-04-05 LAB — TSH: TSH: 4.85 u[IU]/mL — ABNORMAL HIGH (ref 0.35–4.50)

## 2017-04-05 LAB — T3, FREE: T3 FREE: 2.9 pg/mL (ref 2.3–4.2)

## 2017-04-05 MED ORDER — PANTOPRAZOLE SODIUM 40 MG PO TBEC: 40.0000 mg | DELAYED_RELEASE_TABLET | Freq: Two times a day (BID) | ORAL | 3 refills | Status: DC

## 2017-04-05 NOTE — Patient Instructions (Signed)
It was good to see you today- I am glad that you are feeling better since your hospital stay  I will be in touch with your labs If you are not able to fill the protonix please let me know right away!    Let's plan to visit in about 3 months assuming you are doing well in the interim

## 2017-04-06 ENCOUNTER — Encounter: Payer: Self-pay | Admitting: Family Medicine

## 2017-04-06 DIAGNOSIS — E039 Hypothyroidism, unspecified: Secondary | ICD-10-CM

## 2017-04-07 ENCOUNTER — Encounter: Payer: Self-pay | Admitting: Physician Assistant

## 2017-04-07 ENCOUNTER — Ambulatory Visit (INDEPENDENT_AMBULATORY_CARE_PROVIDER_SITE_OTHER): Payer: Medicare Other | Admitting: Physician Assistant

## 2017-04-07 ENCOUNTER — Telehealth: Payer: Self-pay | Admitting: Physician Assistant

## 2017-04-07 VITALS — BP 130/78 | HR 59 | Ht 66.0 in | Wt 219.2 lb

## 2017-04-07 DIAGNOSIS — I2510 Atherosclerotic heart disease of native coronary artery without angina pectoris: Secondary | ICD-10-CM

## 2017-04-07 DIAGNOSIS — I739 Peripheral vascular disease, unspecified: Secondary | ICD-10-CM

## 2017-04-07 DIAGNOSIS — Z8673 Personal history of transient ischemic attack (TIA), and cerebral infarction without residual deficits: Secondary | ICD-10-CM | POA: Diagnosis not present

## 2017-04-07 DIAGNOSIS — I480 Paroxysmal atrial fibrillation: Secondary | ICD-10-CM

## 2017-04-07 DIAGNOSIS — Z79899 Other long term (current) drug therapy: Secondary | ICD-10-CM | POA: Diagnosis not present

## 2017-04-07 DIAGNOSIS — E785 Hyperlipidemia, unspecified: Secondary | ICD-10-CM | POA: Diagnosis not present

## 2017-04-07 DIAGNOSIS — I779 Disorder of arteries and arterioles, unspecified: Secondary | ICD-10-CM | POA: Diagnosis not present

## 2017-04-07 DIAGNOSIS — I10 Essential (primary) hypertension: Secondary | ICD-10-CM | POA: Diagnosis not present

## 2017-04-07 NOTE — Progress Notes (Signed)
Cardiology Office Note    Date:  04/08/2017   ID:  [name], DOB 09/23/1933, MRN [id number]  PCP:  [name], MD  Cardiologist:  Dr. Martinique  Chief Complaint  Patient presents with  . Follow-up    seen for Dr. Martinique    History of Present Illness:  [name] is a 83 y.o. male with PMH of CAD, prostate CA s/p radical prostatectomy 1998, HTN, HLD, h/o carotid artery disease s/p L CEA 1998, and TIA. He was admitted to the hospital in 2015 for atypical chest pain, he was ruled out for MI. Cardiac catheterization demonstrated 70% stenosis in proximal RCA with FFR of 0.82, it was felt this lesion was not flow-limiting therefore he was treated medically. In March 2017, he presented with class III angina despite good medical therapy, repeat cardiac catheterization demonstrated a 70% proximal RCA with FFR of 0.76, this lesion was stented with drug-eluting stent. He did develop generalized weakness in September 2016 with arthralgias, this was attributed to statin and was stopped. Now he is on Zetia. He is also on thyroid replacement therapy for hypothyroidism. He is followed by Dr. Scot Dock for Carotid artery disease and underwent left CEA. Evaluation in December 2016 showed 70-80% right ICA stenosis. He developed cellulitis of the right lower extremity in November 2017. He was last seen by Dr. Martinique in December 2017, he was recovering well at this time.  He was most recently seen in the ED on 03/13/2017, he was started on Xarelto at the time. He was discharged to follow-up with cardiology service as outpatient. It appears he was originally prescribed the 15 mg twice a day DVT dose pack, this was later switched to 20 mg daily. He returned on 03/24/2017 with melena, Xarelto was held. He was started on IV PPI. Endoscopy revealed erosive gastritis, it was recommended for the patient to continue Protonix twice a day indefinitely. He was restarted on Protonix.  He presents today for cardiology  office visit. Other than that one episode of chest pain during atrial fibrillation, he has not had any further chest discomfort. He has chronic stable angina with extreme exertion but not with everyday activity. I will hold off on ischemic workup at this time. He does not have significant lower extremity edema, orthopnea or PND. EKG today shows he is maintaining sinus rhythm. Heart rate is mainly in the 50s, he has some significant baseline fatigue and also occasional slow heart rate as well, I don't think he would tolerate a higher dose of carvedilol as this time. His GFR put him he at least class III chronic kidney disease, he will need to be established with a primary care provider. He is currently on losartan for renal protection.   Past Medical History:  Diagnosis Date  . Anginal pain ([place])   . Anxiety    " OCCASIONAL"  . Arthritis   . Asthma due to seasonal allergies    uses inhalers prn  . Carotid artery occlusion    Left  . Complication of anesthesia    " had tremors after prostate surgery  . Coronary artery disease   . Diverticulitis    recurrent  . Family history of anesthesia complication    MOTHER   . GERD (gastroesophageal reflux disease)   . H/O hiatal hernia   . Hearing aid worn   . Hyperlipemia   . Hypertension   . Kidney stone    lithotripsy 7846 w complications, req stents, Dr Rosana Hoes  .  Prostate CA ([place])   . Shortness of breath   . Thyroid disease   . TIA (transient ischemic attack)   . Tinnitus     Past Surgical History:  Procedure Laterality Date  . CARDIAC CATHETERIZATION  2008   minimal dz, Dr Cathie Olden  . CARDIAC CATHETERIZATION N/A 10/09/2015   Procedure: Left Heart Cath and Coronary Angiography;  Surgeon: Peter M Martinique, MD;  Location: [facility];  Service: Cardiovascular;  Laterality: N/A;  . CARDIAC CATHETERIZATION N/A 10/09/2015   Procedure: Intravascular Pressure Wire/FFR Study;  Surgeon: Peter M Martinique, MD;  Location: [facility];   Service: Cardiovascular;  Laterality: N/A;  . CARDIAC CATHETERIZATION N/A 10/09/2015   Procedure: Coronary Stent Intervention;  Surgeon: Peter M Martinique, MD;  Location: [facility];  Service: Cardiovascular;  Laterality: N/A;  . CAROTID ENDARTERECTOMY  Left   1998  . CHOLECYSTECTOMY    . CORONARY STENT PLACEMENT  10/09/2015   DES to RCA  . ESOPHAGOGASTRODUODENOSCOPY (EGD) WITH PROPOFOL Left 03/26/2017   Procedure: ESOPHAGOGASTRODUODENOSCOPY (EGD) WITH PROPOFOL;  Surgeon: Arta Silence, MD;  Location: [facility];  Service: Endoscopy;  Laterality: Left;  . LEFT HEART CATHETERIZATION WITH CORONARY ANGIOGRAM N/A 09/15/2013   Procedure: LEFT HEART CATHETERIZATION WITH CORONARY ANGIOGRAM;  Surgeon: Peter M Martinique, MD;  Location: [facility];  Service: Cardiovascular;  Laterality: N/A;  . PROSTATECTOMY  1998   radical for prostate cancer    Current Medications: Outpatient Medications Prior to Visit  Medication Sig Dispense Refill  . Artificial Tear Solution (BION TEARS OP) Place 1 drop into both eyes daily as needed (dry eyes).    . carvedilol (COREG) 6.25 MG tablet TAKE 1 TABLET BY MOUTH TWICE A DAY WITH A MEAL 180 tablet 3  . ezetimibe (ZETIA) 10 MG tablet Take 1 tablet (10 mg total) by mouth daily. 90 tablet 3  . HYDROcodone-acetaminophen (NORCO/VICODIN) 5-325 MG tablet Take 0.5-1 tablets by mouth 2 (two) times daily as needed (pain). 60 tablet 0  . hydrocortisone 2.5 % cream Apply 1 application topically 2 (two) times daily.    Marland Kitchen ipratropium (ATROVENT) 0.03 % nasal spray Place 2 sprays into the nose 4 (four) times daily. 30 mL 6  . levothyroxine (SYNTHROID, LEVOTHROID) 100 MCG tablet TAKE 1 TABLET EVERY DAY IN THE MORNING. 90 tablet 1  . liothyronine (CYTOMEL) 5 MCG tablet Take 1 tablet (5 mcg total) by mouth every morning. 90 tablet 3  . losartan (COZAAR) 100 MG tablet Take 100 mg by mouth at bedtime.    . nitroGLYCERIN (NITROLINGUAL) 0.4 MG/SPRAY spray Place 1 spray under the tongue  every 5 (five) minutes x 3 doses as needed for chest pain. 12 g 12  . pantoprazole (PROTONIX) 40 MG tablet Take 1 tablet (40 mg total) by mouth 2 (two) times daily. 180 tablet 3  . polyethylene glycol (MIRALAX / GLYCOLAX) packet Take 17 g by mouth daily. Mix in 8 oz liquid and drink    . rivaroxaban (XARELTO) 20 MG TABS tablet Take 1 tablet (20 mg total) by mouth daily with supper. 30 tablet 0  . triamcinolone cream (KENALOG) 0.1 % Apply 1 application topically daily as needed (rash).    . VENTOLIN HFA 108 (90 Base) MCG/ACT inhaler INHALE 2 PUFFS INTO THE LUNGS EVERY 6 HRS AS NEEDED FOR WHEEZING OR SHORTNESS OF BREATH 18 Inhaler 0  . aspirin 81 MG tablet Take 1 tablet (81 mg total) by mouth daily. 30 tablet 12   No facility-administered  medications prior to visit.      Allergies:   Ciprofloxacin; Codeine; Flexeril [cyclobenzaprine]; Methocarbamol; Sulfa antibiotics; Sulfamethoxazole; Sulfonamide derivatives; and Flagyl [metronidazole]   Social History   Social History  . Marital status: Married    Spouse name: N/A  . Number of children: N/A  . Years of education: N/A   Occupational History  . retired     Actor [employer]   Social History Main Topics  . Smoking status: Former Smoker    Packs/day: 2.50    Years: 30.00    Types: Cigarettes    Quit date: 08/28/1985  . Smokeless tobacco: Never Used  . Alcohol use 1.2 oz/week    2 Standard drinks or equivalent per week     Comment: 1 glass wine/week (prior 1 glass martini with dinner)  . Drug use: No  . Sexual activity: Yes    Birth control/ protection: None   Other Topics Concern  . None   Social History Narrative   Lives with wife--recently diagnosed with breast ca; No smoking; Occassional wine; Retired; 2 daughters live in [place]--5 grandchildren(boys). On weight watchers. Lives in [place] with wife. Retired Solicitor. Tobacco history 2 ppd x 32 years. No smoking x 25 years. No drugs.      Family History:  The patient's family history includes CAD in his child; Dementia in his sister; Hashimoto's thyroiditis in his child; Heart attack in his sister; Heart disease in his father, mother, and sister; Kidney failure in his father; Sjogren's syndrome in his child.   ROS:   Please see the history of present illness.    ROS All other systems reviewed and are negative.   PHYSICAL EXAM:   VS:  BP 130/78   Pulse (!) 59   Ht 5\' 6"  (1.676 m)   Wt 219 lb 3.2 oz (99.4 kg)   BMI 35.38 kg/m    GEN: Well nourished, well developed, in no acute distress  HEENT: normal  Neck: no JVD, carotid bruits, or masses Cardiac: RRR; no murmurs, rubs, or gallops,no edema  Respiratory:  clear to auscultation bilaterally, normal work of breathing GI: soft, nontender, nondistended, + BS MS: no deformity or atrophy  Skin: warm and dry, no rash Neuro:  Alert and Oriented x 3, Strength and sensation are intact Psych: euthymic mood, full affect  Wt Readings from Last 3 Encounters:  04/07/17 219 lb 3.2 oz (99.4 kg)  04/05/17 219 lb 6.4 oz (99.5 kg)  03/26/17 221 lb (100.2 kg)      Studies/Labs Reviewed:   EKG:  EKG is ordered today.  The ekg ordered today demonstrates Normal sinus rhythm, right bundle branch block, otherwise no significant ST-T wave changes.  Recent Labs: 03/24/2017: ALT 27 04/05/2017: BUN 21; Creatinine, Ser 1.64; Hemoglobin 13.7; Platelets 150.0; Potassium 4.6; Sodium 141; TSH 4.85   Lipid Panel    Component Value Date/Time   CHOL 152 01/28/2016 0759   TRIG 150 (H) 01/28/2016 0759   HDL 40 01/28/2016 0759   CHOLHDL 3.8 01/28/2016 0759   CHOLHDL 3.3 03/24/2015 1159   VLDL 24 03/24/2015 1159   LDLCALC 82 01/28/2016 0759   LDLDIRECT 70 12/21/2012 1015    Additional studies/ records that were reviewed today include:   Cath 10/09/2015 Conclusion    Mid RCA lesion, 40% stenosed.  Prox LAD to Mid LAD lesion, 20% stenosed.  Ost 1st Mrg to 1st Mrg lesion, 40%  stenosed.  The left ventricular systolic function is normal.  Prox RCA  lesion, 70% stenosed. Post intervention, there is a 0% residual stenosis.   1. Single vessel obstructive CAD 2. Abnormal FFR of the proximal RCA lesion 3. Normal LV function 4. Successful stenting of the proximal RCA with a DES  Plan: DAPT for one year. Anticipate DC in am      ASSESSMENT:    1. AF (paroxysmal atrial fibrillation) ([place])   2. Essential hypertension   3. Hyperlipidemia, unspecified hyperlipidemia type   4. Medication management   5. Coronary artery disease involving native coronary artery of native heart without angina pectoris   6. Carotid artery disease, unspecified laterality ([place])   7. H/O TIA (transient ischemic attack) and stroke      PLAN:  In order of problems listed above:  1. Paroxysmal atrial fibrillation: CHA2DS2-VASC score 7 (age, TIA, HTN, CAD, PAD), maintaining sinus rhythm on carvedilol. Continue Xarelto.  2. CAD: Aspirin has been discontinued, I will leave it off for now given the need for Xarelto. He does have a lot of bruising on Xarelto. He had recent GI bleed after starting on Xarelto, underwent endoscopy. He does have chronic baseline stable angina, this has not changed recently.  3. Hypertension: Blood pressure stable 130/78  4. Hyperlipidemia: Continue on Zetia, will need a fasting lipid panel and LFTs prior to the next visit.  5. Carotid artery disease: Followed by Dr. Scot Dock  6. History of TIA: No residual deficit    Medication Adjustments/Labs and Tests Ordered: Current medicines are reviewed at length with the patient today.  Concerns regarding medicines are outlined above.  Medication changes, Labs and Tests ordered today are listed in the Patient Instructions below. Patient Instructions  Medication Instructions:  NO CHANGES If you need a refill on your cardiac medications before your next appointment, please call your pharmacy.  Labwork: FASTING  LIPID PANEL AND LIVER TESTS 1 WEEK BEFORE NEXT APPT HERE IN OUR OFFICE AT [facility]  Follow-Up: Your physician wants you to follow-up in: 3 MONTHS WITH DR Martinique.   Thank you for choosing [facility] at [facility] [employer], [place]  04/08/2017 12:36 AM    [address], [place], [place]  [address] Phone: [phone]; Fax: [phone]

## 2017-04-07 NOTE — Telephone Encounter (Signed)
HAO NOTIFIED

## 2017-04-07 NOTE — Telephone Encounter (Signed)
New message    Nystatin/amphoteracin b 25 mg is the name of the medication pt is to call and let Sharyn Lull know.

## 2017-04-07 NOTE — Patient Instructions (Signed)
Medication Instructions:  NO CHANGES If you need a refill on your cardiac medications before your next appointment, please call your pharmacy.  Labwork: FASTING LIPID PANEL AND LIVER TESTS 1 WEEK BEFORE NEXT APPT HERE IN OUR OFFICE AT [facility]  Follow-Up: Your physician wants you to follow-up in: 3 MONTHS WITH DR Martinique.   Thank you for choosing [facility] at [facility]!!

## 2017-04-08 ENCOUNTER — Encounter: Payer: Self-pay | Admitting: Physician Assistant

## 2017-04-08 LAB — T3, REVERSE: T3, Reverse: 24 ng/dL (ref 8–25)

## 2017-04-08 MED ORDER — LEVOTHYROXINE SODIUM 112 MCG PO TABS: 112.0000 ug | ORAL_TABLET | Freq: Every day | ORAL | 6 refills | Status: DC

## 2017-04-28 ENCOUNTER — Telehealth: Payer: Self-pay | Admitting: Cardiology

## 2017-04-28 NOTE — Telephone Encounter (Signed)
Left message for pt to call.

## 2017-04-28 NOTE — Telephone Encounter (Signed)
New Message    Pt c/o medication issue:  1. Name of Medication: Xarelto  2. How are you currently taking this medication (dosage and times per day)?  1 tablet   3. Are you having a reaction (difficulty breathing--STAT)? no   4. What is your medication issue? To expensive for him to get , does he need to take it ?

## 2017-04-30 NOTE — Telephone Encounter (Signed)
Per PA  Isaac Laud) PN dated 03-2017: Plan: DAPT for one year. Anticipate DC in am

## 2017-05-03 ENCOUNTER — Encounter: Payer: Self-pay | Admitting: Pharmacist

## 2017-05-03 NOTE — Telephone Encounter (Signed)
I am unable to reach patient by phone (tried all available numbers for his and his wife).   Sent email through [employer]. Patient to call and request samples. Also need to complete Patient assistance paperwork.

## 2017-05-03 NOTE — Telephone Encounter (Signed)
Please provide samples for 3-4 weeks and paperwork for patient assistance.   Patient with hx of Afib and TIA (CHA2DS2-VASC score of 7)   Per [place] PA note on 04/07/2017: " 1. Paroxysmal atrial fibrillation: CHA2DS2-VASC score 7 (age, TIA, HTN, CAD, PAD), maintaining sinus rhythm on carvedilol. Continue Xarelto.  2. CAD: Aspirin has been discontinued, I will leave it off for now given the need for Xarelto. He does have a lot of bruising on Xarelto. He had recent GI bleed after starting on Xarelto, underwent endoscopy. He does have chronic baseline stable angina, this has not changed recently."

## 2017-05-05 ENCOUNTER — Other Ambulatory Visit: Payer: Self-pay | Admitting: Physician Assistant

## 2017-05-05 NOTE — Telephone Encounter (Signed)
Patient reviewed Raquel's MyChart message as documented "Last read by [name] at 6:16 PM on 05/03/2017."

## 2017-05-05 NOTE — Telephone Encounter (Signed)
Left message for pt to call.

## 2017-05-06 MED ORDER — RIVAROXABAN 20 MG PO TABS: 20.0000 mg | ORAL_TABLET | Freq: Every day | ORAL | 6 refills | Status: DC

## 2017-05-06 NOTE — Telephone Encounter (Signed)
Spoke to patient he stated he would not qualify for patient assistance.Stated he wants to think about the other medications Raquel recommended.Stated he will continue Xarelto and will call Raquel back.

## 2017-05-24 ENCOUNTER — Telehealth: Payer: Self-pay | Admitting: Cardiology

## 2017-05-24 ENCOUNTER — Encounter: Payer: Self-pay | Admitting: Family Medicine

## 2017-05-24 ENCOUNTER — Encounter: Payer: Self-pay | Admitting: Cardiology

## 2017-05-24 NOTE — Telephone Encounter (Signed)
New message   Pt c/o medication issue:  1. Name of Medication: rivaroxaban (XARELTO) 20 MG TABS tablet  2. How are you currently taking this medication (dosage and times per day)? Take 1 tablet (20 mg total) by mouth daily with supper.  3. Are you having a reaction (difficulty breathing--STAT)? No   4. What is your medication issue? Bleeding around ear and face.

## 2017-05-24 NOTE — Telephone Encounter (Signed)
Left message to call back, per dpr. 

## 2017-05-26 NOTE — Telephone Encounter (Signed)
Addressed per MyChart communication copied below  May 25, 2017  [place], Eden Lathe, LPN  to [name]       4:51 PM  Hey Mr.Sheets,Dr.Jordan advised you are a high risk with afib.He understands these bleeding episodes are upsetting,but he recommends you staying on Xarelto.    Last read by [name] at 5:43 PM on 05/25/2017.  Martinique, Peter M, MD  to Golden Hurter D, LPN       6:68 AM  He is on Xarelto to reduce his risk of stroke. He has a high risk with Afib and [place] vasc score of 7. While upsetting these bleeding episodes are minor and I would encourage him to stay on anticoagulation. I will be happy to see him to discuss further.   Peter Martinique MD, [facility]

## 2017-06-16 ENCOUNTER — Other Ambulatory Visit: Payer: Self-pay | Admitting: *Deleted

## 2017-06-16 DIAGNOSIS — I2510 Atherosclerotic heart disease of native coronary artery without angina pectoris: Secondary | ICD-10-CM

## 2017-06-16 MED ORDER — NITROGLYCERIN 0.4 MG/SPRAY TL SOLN: 1.0000 | 12 refills | Status: DC | PRN

## 2017-06-23 ENCOUNTER — Ambulatory Visit: Payer: Medicare Other | Admitting: Family Medicine

## 2017-06-27 NOTE — Progress Notes (Signed)
[facility] at [facility] [address], [place], [place] [address] [phone]  Date:  06/28/2017   Name:  [name]   DOB:  07/15/1933   MRN:  [id number]  PCP:  [name], MD    Chief Complaint: No chief complaint on file.   History of Present Illness:  [name] is a 83 y.o. very pleasant male patient who presents with the following:  Follow-up visit today History of HTN, CKD, hypothyroidism He was admitted back in September with a GI bleed:  Melanotic stools due to nonbleeding erosive gastropathy: Xarelto was held on admission he was on IV PPI twice a day patient nothing by mouth GI was consulted recommended an endoscopy this was done that showed erosive gastritis. They recommended to continue Protonix twice a day indefinitely. And restart Xarelto tomorrow morning  He saw cardiology after this admission-he is still on xarelto due to high risk of clotting episode with his a fib 1. Paroxysmal atrial fibrillation: CHA2DS2-VASC score 7 (age, TIA, HTN, CAD, PAD), maintaining sinus rhythm on carvedilol. Continue Xarelto. 2. CAD: Aspirin has been discontinued, I will leave it off for now given the need for Xarelto. He does have a lot of bruising on Xarelto. He had recent GI bleed after starting on Xarelto, underwent endoscopy. He does have chronic baseline stable angina, this has not changed recently. 3. Hypertension: Blood pressure stable 130/78 4. Hyperlipidemia: Continue on Zetia, will need a fasting lipid panel and LFTs prior to the next visit. 5. Carotid artery disease: Followed by Dr. Scot Dock 6. History of TIA: No residual deficit  His last TSH was slightly high- we increased his thyroid med from 100 to 112; will repeat for him today  Lab Results  Component Value Date   TSH 4.85 (H) 04/05/2017   He has been recommended to stay on xarelto for anticoagulation due to high risk a fib  Accompanied by his wife  Deloris today He notes that he continues to have some minor bleeding such as bleeding when he will scratch his skin or some bleeding from his gums  Also, he notes some difficulty sleeping.  He will go to bed around 11 pm, and then will wake up 1-2 hours later and have a hard time getting back to sleep.  "I can't remember the last time I slept more than 4 hours."   He has noted issues with insomnia for about a year He can sometimes sleep during the day, but not well at night He has tried to prepare his room for sleep- it is dark and quiet.  He tries to follow a good sleep routine  He does note that during the night last night his feet were hurting, and this seemed to stop him from sleeping   He has not tried any medication for sleep- he did have some clonazepam in the past which worked ok- he would rather use something like trazodone.  However, this can interact with his xarelto so is probably not a good idea   He does not feel that urinary sx are keeping him up  He is a pt of [facility]- they last saw him in October  Right shoulder and left knee are bothersome to him and limit his mobility- he has been to PT in the past and they did help him He would like to go to PT at [facility] at [facility]   Patient Active  Problem List   Diagnosis Date Noted  . Erosive gastropathy 03/26/2017  . Acute blood loss anemia 03/25/2017  . Melena 03/24/2017  . CKD (chronic kidney disease), stage III ([place]) 03/24/2017  . AF (paroxysmal atrial fibrillation) ([place]) 03/24/2017  . Angina pectoris ([place]) 10/09/2015  . Coronary artery disease due to lipid rich plaque   . Essential hypertension   . Hyperlipemia   . Carotid artery occlusion   . Dizziness 08/09/2015  . Gait instability 05/30/2015  . Myalgia   . Weakness   . Bloating   . Chest pain at rest 03/24/2015  . Atypical chest pain 03/24/2015  . Fatigue 03/14/2015  . Hypothyroidism, acquired 10/26/2013  . OSA  (obstructive sleep apnea) 10/18/2013  . Well adult health check 01/02/2013  . Other symptoms involving urinary system(788.99) 12/21/2012  . Benign localized hyperplasia of prostate with urinary obstruction and other lower urinary tract symptoms (LUTS)(600.21) 12/21/2012  . Carcinoma in situ of prostate 12/21/2012  . Left shoulder pain 08/08/2012  . Obesity 07/24/2011  . Generalized anxiety disorder 07/24/2011  . DIVERTICULOSIS OF COLON 12/25/2009  . DERMATITIS, SEBORRHEIC 12/25/2009  . 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 Memory loss 08/14/2009  . BACK PAIN, LUMBAR 07/19/2009  . VENOUS INSUFFICIENCY, CHRONIC 09/09/2006  . GASTROESOPHAGEAL REFLUX, NO ESOPHAGITIS 09/09/2006  . HERNIA, HIATAL, NONCONGENITAL 09/09/2006  . INSOMNIA NOS 09/09/2006    Past Medical History:  Diagnosis Date  . Anginal pain ([place])   . Anxiety    " OCCASIONAL"  . Arthritis   . Asthma due to seasonal allergies    uses inhalers prn  . Carotid artery occlusion    Left  . Complication of anesthesia    " had tremors after prostate surgery  . Coronary artery disease   . Diverticulitis    recurrent  . Family history of anesthesia complication    MOTHER   . GERD (gastroesophageal reflux disease)   . H/O hiatal hernia   . Hearing aid worn   . Hyperlipemia   . Hypertension   . Kidney stone    lithotripsy 9024 w complications, req stents, Dr Rosana Hoes  . Prostate CA ([place])   . Shortness of breath   . Thyroid disease   . TIA (transient ischemic attack)   . Tinnitus     Past Surgical History:  Procedure Laterality Date  . CARDIAC CATHETERIZATION  2008   minimal dz, Dr Cathie Olden  . CARDIAC CATHETERIZATION N/A 10/09/2015   Procedure: Left Heart Cath and Coronary Angiography;  Surgeon: Peter M Martinique, MD;  Location: [place] [facility];  Service: Cardiovascular;  Laterality: N/A;  . CARDIAC CATHETERIZATION N/A 10/09/2015   Procedure: Intravascular Pressure Wire/FFR Study;  Surgeon: Peter M Martinique, MD;   Location: [place] [facility];  Service: Cardiovascular;  Laterality: N/A;  . CARDIAC CATHETERIZATION N/A 10/09/2015   Procedure: Coronary Stent Intervention;  Surgeon: Peter M Martinique, MD;  Location: [place] [facility];  Service: Cardiovascular;  Laterality: N/A;  . CAROTID ENDARTERECTOMY  Left   1998  . CHOLECYSTECTOMY    . CORONARY STENT PLACEMENT  10/09/2015   DES to RCA  . ESOPHAGOGASTRODUODENOSCOPY (EGD) WITH PROPOFOL Left 03/26/2017   Procedure: ESOPHAGOGASTRODUODENOSCOPY (EGD) WITH PROPOFOL;  Surgeon: Arta Silence, MD;  Location: [facility];  Service: Endoscopy;  Laterality: Left;  . LEFT HEART CATHETERIZATION WITH CORONARY ANGIOGRAM N/A 09/15/2013   Procedure: LEFT HEART CATHETERIZATION WITH CORONARY ANGIOGRAM;  Surgeon: Peter M Martinique, MD;  [facility];  Service: Cardiovascular;  Laterality: N/A;  .  PROSTATECTOMY  1998   radical for prostate cancer    Social History   Tobacco Use  . Smoking status: Former Smoker    Packs/day: 2.50    Years: 30.00    Pack years: 75.00    Types: Cigarettes    Last attempt to quit: 08/28/1985    Years since quitting: 31.8  . Smokeless tobacco: Never Used  Substance Use Topics  . Alcohol use: Yes    Alcohol/week: 1.2 oz    Types: 2 Standard drinks or equivalent per week    Comment: 1 glass wine/week (prior 1 glass martini with dinner)  . Drug use: No    Family History  Problem Relation Age of Onset  . Heart disease Mother   . Heart disease Father   . Kidney failure Father   . Heart disease Sister   . Heart attack Sister   . Dementia Sister   . Sjogren's syndrome Child   . Hashimoto's thyroiditis Child   . CAD Child     Allergies  Allergen Reactions  . Ciprofloxacin Other (See Comments)    Hallucinations or jittery Hallucinations or jittery  . Codeine Other (See Comments)    hallucinations hallucinations  . Flexeril [Cyclobenzaprine]   . Methocarbamol   . Sulfa Antibiotics Other (See Comments)    Chills and  shaking "serum sickness" Chills and shaking "serum sickness"  . Sulfamethoxazole Other (See Comments)    UNKNOWN REACTION, NOT DOCUMENTED  . Sulfonamide Derivatives Other (See Comments)    Chills and shaking "serum sickness"  . Flagyl [Metronidazole] Rash    Medication list has been reviewed and updated.  Current Outpatient Medications on File Prior to Visit  Medication Sig Dispense Refill  . Artificial Tear Solution (BION TEARS OP) Place 1 drop into both eyes daily as needed (dry eyes).    . carvedilol (COREG) 6.25 MG tablet TAKE 1 TABLET BY MOUTH TWICE A DAY WITH A MEAL 180 tablet 3  . ezetimibe (ZETIA) 10 MG tablet Take 1 tablet (10 mg total) by mouth daily. 90 tablet 3  . HYDROcodone-acetaminophen (NORCO/VICODIN) 5-325 MG tablet Take 0.5-1 tablets by mouth 2 (two) times daily as needed (pain). 60 tablet 0  . hydrocortisone 2.5 % cream Apply 1 application topically 2 (two) times daily.    Marland Kitchen ipratropium (ATROVENT) 0.03 % nasal spray Place 2 sprays into the nose 4 (four) times daily. 30 mL 6  . levothyroxine (SYNTHROID, LEVOTHROID) 112 MCG tablet Take 1 tablet (112 mcg total) by mouth daily. 30 tablet 6  . liothyronine (CYTOMEL) 5 MCG tablet Take 1 tablet (5 mcg total) by mouth every morning. 90 tablet 3  . losartan (COZAAR) 100 MG tablet Take 100 mg by mouth at bedtime.    Marland Kitchen losartan-hydrochlorothiazide (HYZAAR) 100-12.5 MG tablet TAKE 1 TABLET BY MOUTH EVERY EVENING. 90 tablet 2  . nitroGLYCERIN (NITROLINGUAL) 0.4 MG/SPRAY spray Place 1 spray under the tongue every 5 (five) minutes x 3 doses as needed for chest pain. 12 g 12  . pantoprazole (PROTONIX) 40 MG tablet Take 1 tablet (40 mg total) by mouth 2 (two) times daily. 180 tablet 3  . polyethylene glycol (MIRALAX / GLYCOLAX) packet Take 17 g by mouth daily. Mix in 8 oz liquid and drink    . rivaroxaban (XARELTO) 20 MG TABS tablet Take 1 tablet (20 mg total) by mouth daily with supper. 30 tablet 6  . triamcinolone cream (KENALOG) 0.1 %  Apply 1 application topically daily as needed (rash).    Marland Kitchen  VENTOLIN HFA 108 (90 Base) MCG/ACT inhaler INHALE 2 PUFFS INTO THE LUNGS EVERY 6 HRS AS NEEDED FOR WHEEZING OR SHORTNESS OF BREATH 18 Inhaler 0   No current facility-administered medications on file prior to visit.     Review of Systems:  As per HPI- otherwise negative. He notes that his memory, hearing and vision are getting worse He wears hearing aids  He is seeing his eye doctor soon   Physical Examination: Vitals:   06/28/17 0834  BP: (!) 143/61  Pulse: 77  Resp: 16  Temp: 97.7 F (36.5 C)  SpO2: 98%   Vitals:   06/28/17 0834  Weight: 220 lb (99.8 kg)   Body mass index is 35.51 kg/m. Ideal Body Weight:    GEN: WDWN, NAD, Non-toxic, A & O x 3, obese, looks well HEENT: Atraumatic, Normocephalic. Neck supple. No masses, No LAD.  Wearing hearing aids bilaterally Ears and Nose: No external deformity. CV: RRR, No M/G/R. No JVD. No thrill. No extra heart sounds. PULM: CTA B, no wheezes, crackles, rhonchi. No retractions. No resp. distress. No accessory muscle use. ABD: S, NT, ND, +BS. No rebound. No HSM. EXTR: No c/c/e NEURO difficulty getting onto exam table due to knee issues, slow gait PSYCH: Normally interactive. Conversant. Not depressed or anxious appearing.  Calm demeanor.  Right shoulder: pain and limited ROM with internal rotation especially, and with external rotation and abduction Left knee with normal strength and ROM   Assessment and Plan: Adjustment insomnia - Plan: clonazePAM (KLONOPIN) 0.5 MG tablet  Shoulder stiffness, right - Plan: Ambulatory referral to Physical Therapy  Chronic pain of left knee - Plan: Ambulatory referral to Physical Therapy  Acquired hypothyroidism - Plan: TSH  Follow-up visit today Will refer back to PT for his shoulder and knee concerns For sleep, refilled his klonopin- he will use a 1/2 or 1/4 tab as needed for sleep Recheck TSH today and will be in touch with  him   Signed Lamar Blinks, MD  Received TSH-  Results for orders placed or performed in visit on 06/28/17  TSH  Result Value Ref Range   TSH 4.63 (H) 0.35 - 4.50 uIU/mL   Very close to normal range.  Will leave at current dose for now, recheck in 3-4 months

## 2017-06-28 ENCOUNTER — Ambulatory Visit: Payer: Medicare Other | Admitting: Family Medicine

## 2017-06-28 ENCOUNTER — Encounter: Payer: Self-pay | Admitting: Family Medicine

## 2017-06-28 VITALS — BP 143/61 | HR 77 | Temp 97.7°F | Resp 16 | Wt 220.0 lb

## 2017-06-28 DIAGNOSIS — F5102 Adjustment insomnia: Secondary | ICD-10-CM | POA: Diagnosis not present

## 2017-06-28 DIAGNOSIS — G8929 Other chronic pain: Secondary | ICD-10-CM

## 2017-06-28 DIAGNOSIS — E039 Hypothyroidism, unspecified: Secondary | ICD-10-CM

## 2017-06-28 DIAGNOSIS — M25562 Pain in left knee: Secondary | ICD-10-CM | POA: Diagnosis not present

## 2017-06-28 DIAGNOSIS — M25611 Stiffness of right shoulder, not elsewhere classified: Secondary | ICD-10-CM | POA: Diagnosis not present

## 2017-06-28 LAB — TSH: TSH: 4.63 u[IU]/mL — AB (ref 0.35–4.50)

## 2017-06-28 MED ORDER — CLONAZEPAM 0.5 MG PO TABS: ORAL_TABLET | ORAL | 1 refills | Status: DC

## 2017-06-28 NOTE — Patient Instructions (Signed)
Good to see you today- take care and let me know how you do with the clonazepam for sleep. Use this as little as you can, but I would rather you use this and sleep than vice versa!  We will set you up with PT again for your right shoulder and left knee I'll be in touch with your TSH Take care and please see me in 3-4 months

## 2017-07-07 DIAGNOSIS — R0602 Shortness of breath: Secondary | ICD-10-CM | POA: Insufficient documentation

## 2017-07-07 DIAGNOSIS — I2510 Atherosclerotic heart disease of native coronary artery without angina pectoris: Secondary | ICD-10-CM | POA: Insufficient documentation

## 2017-07-07 DIAGNOSIS — I10 Essential (primary) hypertension: Secondary | ICD-10-CM | POA: Insufficient documentation

## 2017-07-07 DIAGNOSIS — J45909 Unspecified asthma, uncomplicated: Secondary | ICD-10-CM | POA: Insufficient documentation

## 2017-07-07 DIAGNOSIS — Z974 Presence of external hearing-aid: Secondary | ICD-10-CM | POA: Insufficient documentation

## 2017-07-07 DIAGNOSIS — G459 Transient cerebral ischemic attack, unspecified: Secondary | ICD-10-CM | POA: Insufficient documentation

## 2017-07-07 DIAGNOSIS — E079 Disorder of thyroid, unspecified: Secondary | ICD-10-CM | POA: Insufficient documentation

## 2017-07-07 DIAGNOSIS — T4145XA Adverse effect of unspecified anesthetic, initial encounter: Secondary | ICD-10-CM | POA: Insufficient documentation

## 2017-07-07 DIAGNOSIS — C61 Malignant neoplasm of prostate: Secondary | ICD-10-CM | POA: Insufficient documentation

## 2017-07-07 DIAGNOSIS — F419 Anxiety disorder, unspecified: Secondary | ICD-10-CM | POA: Insufficient documentation

## 2017-07-07 DIAGNOSIS — T8859XA Other complications of anesthesia, initial encounter: Secondary | ICD-10-CM | POA: Insufficient documentation

## 2017-07-07 DIAGNOSIS — K5792 Diverticulitis of intestine, part unspecified, without perforation or abscess without bleeding: Secondary | ICD-10-CM | POA: Insufficient documentation

## 2017-07-07 DIAGNOSIS — K219 Gastro-esophageal reflux disease without esophagitis: Secondary | ICD-10-CM | POA: Insufficient documentation

## 2017-07-07 DIAGNOSIS — I209 Angina pectoris, unspecified: Secondary | ICD-10-CM | POA: Insufficient documentation

## 2017-07-07 DIAGNOSIS — Z8489 Family history of other specified conditions: Secondary | ICD-10-CM | POA: Insufficient documentation

## 2017-07-07 DIAGNOSIS — Z8719 Personal history of other diseases of the digestive system: Secondary | ICD-10-CM | POA: Insufficient documentation

## 2017-07-07 DIAGNOSIS — M1990 Unspecified osteoarthritis, unspecified site: Secondary | ICD-10-CM | POA: Insufficient documentation

## 2017-07-07 DIAGNOSIS — N200 Calculus of kidney: Secondary | ICD-10-CM | POA: Insufficient documentation

## 2017-07-09 ENCOUNTER — Encounter: Payer: Self-pay | Admitting: Physical Therapy

## 2017-07-09 ENCOUNTER — Ambulatory Visit: Payer: Medicare Other | Attending: Family Medicine | Admitting: Physical Therapy

## 2017-07-09 DIAGNOSIS — M25611 Stiffness of right shoulder, not elsewhere classified: Secondary | ICD-10-CM

## 2017-07-09 DIAGNOSIS — R262 Difficulty in walking, not elsewhere classified: Secondary | ICD-10-CM | POA: Insufficient documentation

## 2017-07-09 DIAGNOSIS — M25511 Pain in right shoulder: Secondary | ICD-10-CM | POA: Insufficient documentation

## 2017-07-09 DIAGNOSIS — M25562 Pain in left knee: Secondary | ICD-10-CM | POA: Insufficient documentation

## 2017-07-09 NOTE — Therapy (Signed)
[address] [place], [place], [address] Phone: [phone]   Fax:  [phone]  Physical Therapy Evaluation  Patient Details  Name: [name] MRN: [id number] Date of Birth: [birth year] Referring Provider: Janett Billow Copland   Encounter Date: 07/09/2017  PT End of Session - 07/09/17 1045    Visit Number  1    Date for PT Re-Evaluation  09/09/17    PT Start Time  1007    PT Stop Time  1052    PT Time Calculation (min)  45 min    Activity Tolerance  Patient tolerated treatment well    Behavior During Therapy  [facility] for tasks assessed/performed       Past Medical History:  Diagnosis Date  . Anginal pain ([place])   . Anxiety    " OCCASIONAL"  . Arthritis   . Asthma due to seasonal allergies    uses inhalers prn  . Carotid artery occlusion    Left  . Complication of anesthesia    " had tremors after prostate surgery  . Coronary artery disease   . Diverticulitis    recurrent  . Family history of anesthesia complication    MOTHER   . GERD (gastroesophageal reflux disease)   . H/O hiatal hernia   . Hearing aid worn   . Hyperlipemia   . Hypertension   . Kidney stone    lithotripsy 2094 w complications, req stents, Dr Rosana Hoes  . Prostate CA ([place])   . Shortness of breath   . Thyroid disease   . TIA (transient ischemic attack)   . Tinnitus     Past Surgical History:  Procedure Laterality Date  . CARDIAC CATHETERIZATION  2008   minimal dz, Dr Cathie Olden  . CARDIAC CATHETERIZATION N/A 10/09/2015   Procedure: Left Heart Cath and Coronary Angiography;  Surgeon: Peter M Martinique, MD;  Location: [place] [facility];  Service: Cardiovascular;  Laterality: N/A;  . CARDIAC CATHETERIZATION N/A 10/09/2015   Procedure: Intravascular Pressure Wire/FFR Study;  Surgeon: Peter M Martinique, MD;  Location: [place] [facility];  Service: Cardiovascular;  Laterality: N/A;  . CARDIAC CATHETERIZATION N/A 10/09/2015   Procedure:  Coronary Stent Intervention;  Surgeon: Peter M Martinique, MD;  Location: [place] [facility];  Service: Cardiovascular;  Laterality: N/A;  . CAROTID ENDARTERECTOMY  Left   1998  . CHOLECYSTECTOMY    . CORONARY STENT PLACEMENT  10/09/2015   DES to RCA  . ESOPHAGOGASTRODUODENOSCOPY (EGD) WITH PROPOFOL Left 03/26/2017   Procedure: ESOPHAGOGASTRODUODENOSCOPY (EGD) WITH PROPOFOL;  Surgeon: Arta Silence, MD;  [facility]: [facility];  Service: Endoscopy;  Laterality: Left;  . LEFT HEART CATHETERIZATION WITH CORONARY ANGIOGRAM N/A 09/15/2013   Procedure: LEFT HEART CATHETERIZATION WITH CORONARY ANGIOGRAM;  Surgeon: Peter M Martinique, MD;  Location: [facility] CATH LAB;  Service: Cardiovascular;  Laterality: N/A;  . PROSTATECTOMY  1998   radical for prostate cancer    There were no vitals filed for this visit.   Subjective Assessment - 07/09/17 1017    Subjective  Patient was seen here earlier in the year for the right shoulder issue, he got a lot better, but reports he stopped the exercises and reports that he has left knee issues, He reports that he was having difficulty with the left knee due to pain and weakness, he reports that he was really using the right arm to pull himself up the stairs and caused the right shoulder pain to flare up.  Limitations  Walking;House hold activities;Lifting    Patient Stated Goals  have less right shoulder and left knee pain    Currently in Pain?  Yes    Pain Score  0-No pain    Pain Location  Shoulder also left knee pain    Pain Orientation  Right    Pain Descriptors / Indicators  Aching;Sharp;Sore    Pain Type  Acute pain    Pain Onset  More than a month ago    Pain Frequency  Intermittent    Aggravating Factors   going up stairs 8-9/10 in the left knee, reports trying to reach up and also behind the pain will be up to 8/10 in the right shoulder    Pain Relieving Factors  rest no pain    Effect of Pain on Daily Activities  difficulty dressing, doing hair, getting up  from sitting, stairs         [facility] PT Assessment - 07/09/17 0001      Assessment   Medical Diagnosis  right shoulder pain and stiffness, left knee pain    Referring Provider  Jessica Copland    Onset Date/Surgical Date  06/09/17    Hand Dominance  Right    Prior Therapy  for the right shoulder earlier this year with good results      Precautions   Precautions  None      Balance Screen   Has the patient fallen in the past 6 months  No    Has the patient had a decrease in activity level because of a fear of falling?   No    Is the patient reluctant to leave their home because of a fear of falling?   No      Home Environment   Additional Comments  [place], 1/2 a flight up, but he doesn't have to go up and down often, laundry room is upstairs, doesn't do housework has rumba.       Prior Function   Level of Independence  Independent    Vocation  Retired    Leisure  no exercise, on computer 10-12 hours a day      Posture/Postural Control   Posture Comments  fwd head, rounded shoulders      ROM / Strength   AROM / PROM / Strength  AROM;Strength;PROM      AROM   AROM Assessment Site  Shoulder;Knee    Right/Left Shoulder  Right    Right Shoulder Flexion  108 Degrees    Right Shoulder ABduction  70 Degrees    Right Shoulder Internal Rotation  10 Degrees    Right Shoulder External Rotation  45 Degrees    Right/Left Knee  Left    Left Knee Extension  10    Left Knee Flexion  110      PROM   PROM Assessment Site  Shoulder    Right/Left Shoulder  Right    Right Shoulder Flexion  114 Degrees    Right Shoulder ABduction  82 Degrees    Right Shoulder Internal Rotation  20 Degrees    Right Shoulder External Rotation  52 Degrees      Strength   Overall Strength Comments  right shoulder 4-/5 with right shoulder pain, left knee 4/5 with mild knee pain      Palpation   Palpation comment  non tender, some spasms in the upper trap, some crepitus in the right shoulder and the  left knee with AROM  Transfers   Comments  difficulty getting up from sitting due to the left knee, this puts increased stress on the right shoulder      Ambulation/Gait   Gait Comments  good gait on level surfaces, on stairs has to do one at a time and limits the left knee use             Objective measurements completed on examination: See above findings.              PT Education - 07/09/17 1045    Education provided  Yes    Education Details  AAROM for flexion and ER    Person(s) Educated  Patient;Spouse    Methods  Explanation;Demonstration;Handout    Comprehension  Verbalized understanding       PT Short Term Goals - 07/09/17 1049      PT SHORT TERM GOAL #1   Title  Demonstrate proper execution of HEP.     Time  2    Period  Weeks    Status  New        PT Long Term Goals - 07/09/17 1049      PT LONG TERM GOAL #1   Title  Decrease pain with activity to 5/10 at worst.     Time  8    Period  Weeks    Status  New      PT LONG TERM GOAL #2   Title  Increase AROM of right shoulder flexion to125 degrees.     Time  8    Period  Weeks    Status  New      PT LONG TERM GOAL #3   Title  Pt. will be able to put on his shirt with 50% less difficulty.     Time  8    Period  Weeks    Status  New      PT LONG TERM GOAL #4   Title  Pt will be able to reach opposite shoulder for bathing and personal hygiene.    Time  8    Period  Weeks    Status  New      PT LONG TERM GOAL #5   Title  Independent with gym transition    Time  8    Period  Weeks    Status  New             Plan - 07/09/17 1046    Clinical Impression Statement  Patient was seen her in June-August for right shoulder stiffness and pain, he improved greatly and had less pain, he reports that he has not done any exercises and has had some left knee pain, this has caused him to use the right arm to get up from sitting and to pull himself up stairs, he reports increased right  shoulder pain and loss of motion as well as the left knee pain with stairs.  His AROM has decreased 40 degrees for all motions since we discharged him in August.    Clinical Presentation  Stable    Clinical Decision Making  Low    Rehab Potential  Good    PT Frequency  2x / week    PT Duration  8 weeks    PT Treatment/Interventions  ADLs/Self Care Home Management;Cryotherapy;Electrical Stimulation;Gait training;Moist Heat;Iontophoresis 4mg /ml Dexamethasone;Stair training;Functional mobility training;Therapeutic activities;Therapeutic exercise;Balance training;Neuromuscular re-education;Manual techniques;Patient/family education    PT Next Visit Plan  slowly add exercises, address the knee issue as well as the  right shoulder    Consulted and Agree with Plan of Care  Patient       Patient will benefit from skilled therapeutic intervention in order to improve the following deficits and impairments:  Decreased activity tolerance, Abnormal gait, Decreased mobility, Decreased strength, Postural dysfunction, Improper body mechanics, Pain, Impaired UE functional use, Decreased range of motion, Difficulty walking  Visit Diagnosis: Acute pain of right shoulder - Plan: PT plan of care cert/re-cert  Stiffness of right shoulder, not elsewhere classified - Plan: PT plan of care cert/re-cert  Acute pain of left knee - Plan: PT plan of care cert/re-cert  Difficulty in walking, not elsewhere classified - Plan: PT plan of care cert/re-cert  G-Codes - 96/29/52 1153    Functional Assessment Tool Used (Outpatient Only)  foto 65% limitation    Functional Limitation  Other PT primary    Other PT Primary Current Status (W4132)  At least 60 percent but less than 80 percent impaired, limited or restricted    Other PT Primary Goal Status (G4010)  At least 40 percent but less than 60 percent impaired, limited or restricted        Problem List Patient Active Problem List   Diagnosis Date Noted  . TIA  (transient ischemic attack)   . Thyroid disease   . Shortness of breath   . Prostate CA ([employer])   . Kidney stone   . Hypertension   . Hearing aid worn   . H/O hiatal hernia   . GERD (gastroesophageal reflux disease)   . Family history of anesthesia complication   . Diverticulitis   . Coronary artery disease   . Complication of anesthesia   . Asthma due to seasonal allergies   . Arthritis   . Anxiety   . Anginal pain ([place])   . Erosive gastropathy 03/26/2017  . Acute blood loss anemia 03/25/2017  . Melena 03/24/2017  . CKD (chronic kidney disease), stage III ([place]) 03/24/2017  . AF (paroxysmal atrial fibrillation) ([place]) 03/24/2017  . Angina pectoris ([place]) 10/09/2015  . Coronary artery disease due to lipid rich plaque   . Essential hypertension   . Hyperlipemia   . Carotid artery occlusion   . Dizziness 08/09/2015  . Gait instability 05/30/2015  . Myalgia   . Weakness   . Bloating   . Chest pain at rest 03/24/2015  . Atypical chest pain 03/24/2015  . Fatigue 03/14/2015  . Hypothyroidism, acquired 10/26/2013  . OSA (obstructive sleep apnea) 10/18/2013  . Well adult health check 01/02/2013  . Other symptoms involving urinary system(788.99) 12/21/2012  . Benign localized hyperplasia of prostate with urinary obstruction and other lower urinary tract symptoms (LUTS)(600.21) 12/21/2012  . Carcinoma in situ of prostate 12/21/2012  . Left shoulder pain 08/08/2012  . Obesity 07/24/2011  . Generalized anxiety disorder 07/24/2011  . DIVERTICULOSIS OF COLON 12/25/2009  . DERMATITIS, SEBORRHEIC 12/25/2009  . 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 Memory loss 08/14/2009  . BACK PAIN, LUMBAR 07/19/2009  . VENOUS INSUFFICIENCY, CHRONIC 09/09/2006  . GASTROESOPHAGEAL REFLUX, NO ESOPHAGITIS 09/09/2006  . HERNIA, HIATAL, NONCONGENITAL 09/09/2006  . INSOMNIA NOS 09/09/2006    Sumner Boast., PT 07/09/2017, 11:58 AM  [address] [place], [place], [address] Phone: [phone]   Fax:  [phone]  Name: [name] MRN: [id number] Date of Birth: 1933/12/16

## 2017-07-10 LAB — HEPATIC FUNCTION PANEL
ALBUMIN: 4.5 g/dL (ref 3.5–4.7)
ALT: 26 IU/L (ref 0–44)
AST: 23 IU/L (ref 0–40)
Alkaline Phosphatase: 65 IU/L (ref 39–117)
BILIRUBIN TOTAL: 1.4 mg/dL — AB (ref 0.0–1.2)
BILIRUBIN, DIRECT: 0.34 mg/dL (ref 0.00–0.40)
TOTAL PROTEIN: 7.3 g/dL (ref 6.0–8.5)

## 2017-07-10 LAB — LIPID PANEL
CHOLESTEROL TOTAL: 153 mg/dL (ref 100–199)
Chol/HDL Ratio: 4 ratio (ref 0.0–5.0)
HDL: 38 mg/dL — ABNORMAL LOW (ref >39)
LDL Calculated: 75 mg/dL (ref 0–99)
Triglycerides: 198 mg/dL — ABNORMAL HIGH (ref 0–149)
VLDL CHOLESTEROL CAL: 40 mg/dL (ref 5–40)

## 2017-07-12 ENCOUNTER — Other Ambulatory Visit: Payer: Self-pay | Admitting: Family Medicine

## 2017-07-12 DIAGNOSIS — J300 Vasomotor rhinitis: Secondary | ICD-10-CM

## 2017-07-15 ENCOUNTER — Ambulatory Visit: Payer: Medicare Other | Attending: Family Medicine | Admitting: Physical Therapy

## 2017-07-15 DIAGNOSIS — M25562 Pain in left knee: Secondary | ICD-10-CM

## 2017-07-15 DIAGNOSIS — M25511 Pain in right shoulder: Secondary | ICD-10-CM

## 2017-07-15 DIAGNOSIS — R262 Difficulty in walking, not elsewhere classified: Secondary | ICD-10-CM | POA: Insufficient documentation

## 2017-07-15 DIAGNOSIS — M25611 Stiffness of right shoulder, not elsewhere classified: Secondary | ICD-10-CM

## 2017-07-15 NOTE — Therapy (Signed)
[address] [place], [place], [address] Phone: [phone]   Fax:  [phone]  Physical Therapy Treatment  Patient Details  Name: [name] MRN: [id number] Date of Birth: [birth year] Referring Provider: Janett Billow Copland   Encounter Date: 07/15/2017  PT End of Session - 07/15/17 1033    Visit Number  2    Date for PT Re-Evaluation  09/09/17    PT Start Time  1540    PT Stop Time  1050    PT Time Calculation (min)  35 min       Past Medical History:  Diagnosis Date  . Anginal pain ([place])   . Anxiety    " OCCASIONAL"  . Arthritis   . Asthma due to seasonal allergies    uses inhalers prn  . Carotid artery occlusion    Left  . Complication of anesthesia    " had tremors after prostate surgery  . Coronary artery disease   . Diverticulitis    recurrent  . Family history of anesthesia complication    MOTHER   . GERD (gastroesophageal reflux disease)   . H/O hiatal hernia   . Hearing aid worn   . Hyperlipemia   . Hypertension   . Kidney stone    lithotripsy 0867 w complications, req stents, Dr Rosana Hoes  . Prostate CA ([place])   . Shortness of breath   . Thyroid disease   . TIA (transient ischemic attack)   . Tinnitus     Past Surgical History:  Procedure Laterality Date  . CARDIAC CATHETERIZATION  2008   minimal dz, Dr Cathie Olden  . CARDIAC CATHETERIZATION N/A 10/09/2015   Procedure: Left Heart Cath and Coronary Angiography;  Surgeon: Peter M Martinique, MD;  Location: [facility];  Service: Cardiovascular;  Laterality: N/A;  . CARDIAC CATHETERIZATION N/A 10/09/2015   Procedure: Intravascular Pressure Wire/FFR Study;  Surgeon: Peter M Martinique, MD;  Location: [facility];  Service: Cardiovascular;  Laterality: N/A;  . CARDIAC CATHETERIZATION N/A 10/09/2015   Procedure: Coronary Stent Intervention;  Surgeon: Peter M Martinique, MD;  Location: [facility];  Service: Cardiovascular;  Laterality:  N/A;  . CAROTID ENDARTERECTOMY  Left   1998  . CHOLECYSTECTOMY    . CORONARY STENT PLACEMENT  10/09/2015   DES to RCA  . ESOPHAGOGASTRODUODENOSCOPY (EGD) WITH PROPOFOL Left 03/26/2017   Procedure: ESOPHAGOGASTRODUODENOSCOPY (EGD) WITH PROPOFOL;  Surgeon: Arta Silence, MD;  Location: [facility];  Service: Endoscopy;  Laterality: Left;  . LEFT HEART CATHETERIZATION WITH CORONARY ANGIOGRAM N/A 09/15/2013   Procedure: LEFT HEART CATHETERIZATION WITH CORONARY ANGIOGRAM;  Surgeon: Peter M Martinique, MD;  Location: [facility];  Service: Cardiovascular;  Laterality: N/A;  . PROSTATECTOMY  1998   radical for prostate cancer    There were no vitals filed for this visit.  Subjective Assessment - 07/15/17 1015    Subjective  some days I can do ex given from eval and other days no way. pt verb took sleeping pill at 4 am thsi mornig so very sleepy    Currently in Pain?  No/denies                      [facility] Adult PT Treatment/Exercise - 07/15/17 0001      Exercises   Exercises  Shoulder;Lumbar;Knee/Hip      Knee/Hip Exercises: Aerobic   Nustep  L 3 6 min      Knee/Hip  Exercises: Seated   Long Arc Quad  Both;10 reps;Weights;2 sets    [employer] Weight  2 lbs.    Ball Squeeze  20    Clamshell with [employer]raBand  Red 15    Marching  Both;2 sets;10 reps;Weights    Marching Weights  2 lbs.    Abduction/Adduction   Both;2 sets;10 reps;Weights    Abd/Adduction Weights  2 lbs.      Shoulder Exercises: Seated   Flexion  Both;15 reps;Weights plus chest press    Flexion Weight (lbs)  2      Shoulder Exercises: Standing   Internal Rotation  Strengthening;15 reps;Weights    Internal Rotation Weight (lbs)  2    Extension  Strengthening;Both;15 reps;Weights    Extension Weight (lbs)  2      Shoulder Exercises: ROM/Strengthening   Other ROM/Strengthening Exercises  lat pull and row 15# 2 sets 10      Manual Therapy   Manual Therapy  Passive ROM    Passive ROM  RT shld all motions  but pt very resistant/guarded               PT Short Term Goals - 07/09/17 1049      PT SHORT TERM GOAL #1   Title  Demonstrate proper execution of HEP.     Time  2    Period  Weeks    Status  New        PT Long Term Goals - 07/09/17 1049      PT LONG TERM GOAL #1   Title  Decrease pain with activity to 5/10 at worst.     Time  8    Period  Weeks    Status  New      PT LONG TERM GOAL #2   Title  Increase AROM of right shoulder flexion to125 degrees.     Time  8    Period  Weeks    Status  New      PT LONG TERM GOAL #3   Title  Pt. will be able to put on his shirt with 50% less difficulty.     Time  8    Period  Weeks    Status  New      PT LONG TERM GOAL #4   Title  Pt will be able to reach opposite shoulder for bathing and personal hygiene.    Time  8    Period  Weeks    Status  New      PT LONG TERM GOAL #5   Title  Independent with gym transition    Time  8    Period  Weeks    Status  New            Plan - 07/15/17 1033    Clinical Impression Statement  pt tolerated ther ex well but not PROM ( guarded and painful) cuing needed with ex and MT- only pain with increased ROM. pt VERY sleepy thru entire session    PT Treatment/Interventions  ADLs/Self Care Home Management;Cryotherapy;Electrical Stimulation;Gait training;Moist Heat;Iontophoresis 4mg /ml Dexamethasone;Stair training;Functional mobility training;Therapeutic activities;Therapeutic exercise;Balance training;Neuromuscular re-education;Manual techniques;Patient/family education    PT Next Visit Plan  slowly add exercises, address the knee issue as well as the right shoulder       Patient will benefit from skilled therapeutic intervention in order to improve the following deficits and impairments:  Decreased activity tolerance, Abnormal gait, Decreased mobility, Decreased strength, Postural dysfunction, Improper body mechanics,  Pain, Impaired UE functional use, Decreased range of motion,  Difficulty walking  Visit Diagnosis: Acute pain of right shoulder  Stiffness of right shoulder, not elsewhere classified  Acute pain of left knee     Problem List Patient Active Problem List   Diagnosis Date Noted  . TIA (transient ischemic attack)   . Thyroid disease   . Shortness of breath   . Prostate CA ([place])   . Kidney stone   . Hypertension   . Hearing aid worn   . H/O hiatal hernia   . GERD (gastroesophageal reflux disease)   . Family history of anesthesia complication   . Diverticulitis   . Coronary artery disease   . Complication of anesthesia   . Asthma due to seasonal allergies   . Arthritis   . Anxiety   . Anginal pain ([place])   . Erosive gastropathy 03/26/2017  . Acute blood loss anemia 03/25/2017  . Melena 03/24/2017  . CKD (chronic kidney disease), stage III ([place]) 03/24/2017  . AF (paroxysmal atrial fibrillation) ([place]) 03/24/2017  . Angina pectoris ([place]) 10/09/2015  . Coronary artery disease due to lipid rich plaque   . Essential hypertension   . Hyperlipemia   . Carotid artery occlusion   . Dizziness 08/09/2015  . Gait instability 05/30/2015  . Myalgia   . Weakness   . Bloating   . Chest pain at rest 03/24/2015  . Atypical chest pain 03/24/2015  . Fatigue 03/14/2015  . Hypothyroidism, acquired 10/26/2013  . OSA (obstructive sleep apnea) 10/18/2013  . Well adult health check 01/02/2013  . Other symptoms involving urinary system(788.99) 12/21/2012  . Benign localized hyperplasia of prostate with urinary obstruction and other lower urinary tract symptoms (LUTS)(600.21) 12/21/2012  . Carcinoma in situ of prostate 12/21/2012  . Left shoulder pain 08/08/2012  . Obesity 07/24/2011  . Generalized anxiety disorder 07/24/2011  . DIVERTICULOSIS OF COLON 12/25/2009  . DERMATITIS, SEBORRHEIC 12/25/2009  . 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 Memory loss 08/14/2009  . BACK PAIN, LUMBAR 07/19/2009  . VENOUS INSUFFICIENCY, CHRONIC 09/09/2006  .  GASTROESOPHAGEAL REFLUX, NO ESOPHAGITIS 09/09/2006  . HERNIA, HIATAL, NONCONGENITAL 09/09/2006  . INSOMNIA NOS 09/09/2006    PAYSEUR,ANGIE PTA 07/15/2017, 10:36 AM  [address] [place], [place], [address] Phone: [phone]   Fax:  [phone]  Name: [name] MRN: [id number] Date of Birth: 10-20-1933

## 2017-07-17 NOTE — Progress Notes (Signed)
Cardiology Office Note    Date:  07/22/2017   ID:  [name], DOB Sep 02, 1933, MRN [id number]  PCP:  [name], MD  Cardiologist:  Dr. Martinique  Chief Complaint  Patient presents with  . Coronary Artery Disease    History of Present Illness:  [name] is a 84 y.o. male with PMH of CAD, prostate CA s/p radical prostatectomy 1998, HTN, HLD, h/o carotid artery disease s/p L CEA 1998, and TIA. He was admitted to the hospital in 2015 for atypical chest pain, he was ruled out for MI. Cardiac catheterization demonstrated 70% stenosis in proximal RCA with FFR of 0.82, it was felt this lesion was not flow-limiting therefore he was treated medically. In March 2017, he presented with class III angina despite good medical therapy, repeat cardiac catheterization demonstrated a 70% proximal RCA with FFR of 0.76, this lesion was stented with drug-eluting stent. He did develop generalized weakness in September 2016 with arthralgias, this was attributed to statin and was stopped. Now he is on Zetia. He is also on thyroid replacement therapy for hypothyroidism. He is followed by Dr. Scot Dock for Carotid artery disease and underwent left CEA. Evaluation in December 2016 showed 70-80% right ICA stenosis. He developed cellulitis of the right lower extremity in November 2017. Follow up dopplers in April 2018 showed 40-59% RICA stenosis.   He was  seen in the ED on 03/13/2017 with new onset AFib, he was started on Xarelto at the time. He returned on 03/24/2017 with melena, Xarelto was held. He was started on IV PPI. Endoscopy revealed erosive gastritis, it was recommended for the patient to continue Protonix twice a day indefinitely.   On follow up today he has a number of complaints. He does note angina when getting into bed at night and when going up stairs. He is more sedentary due to shoulder and knee problems. He is seeing a physical therapist twice a week for this. He has been seen by Nephrology.  His  GFR put him he at least class III chronic kidney disease. He is currently on losartan for renal protection. He has chronic hearing loss.   Past Medical History:  Diagnosis Date  . Anginal pain ([place])   . Anxiety    " OCCASIONAL"  . Arthritis   . Asthma due to seasonal allergies    uses inhalers prn  . Carotid artery occlusion    Left  . Complication of anesthesia    " had tremors after prostate surgery  . Coronary artery disease   . Diverticulitis    recurrent  . Family history of anesthesia complication    MOTHER   . GERD (gastroesophageal reflux disease)   . H/O hiatal hernia   . Hearing aid worn   . Hyperlipemia   . Hypertension   . Kidney stone    lithotripsy 8841 w complications, req stents, Dr Rosana Hoes  . Prostate CA ([place])   . Shortness of breath   . Thyroid disease   . TIA (transient ischemic attack)   . Tinnitus     Past Surgical History:  Procedure Laterality Date  . CARDIAC CATHETERIZATION  2008   minimal dz, Dr Cathie Olden  . CARDIAC CATHETERIZATION N/A 10/09/2015   Procedure: Left Heart Cath and Coronary Angiography;  Surgeon: Peter M Martinique, MD;  Location: [facility];  Service: Cardiovascular;  Laterality: N/A;  . CARDIAC CATHETERIZATION N/A 10/09/2015   Procedure: Intravascular Pressure Wire/FFR Study;  Surgeon: Peter M Martinique,  MD;  Location: [facility];  Service: Cardiovascular;  Laterality: N/A;  . CARDIAC CATHETERIZATION N/A 10/09/2015   Procedure: Coronary Stent Intervention;  Surgeon: Peter M Martinique, MD;  Location: [facility];  Service: Cardiovascular;  Laterality: N/A;  . CAROTID ENDARTERECTOMY  Left   1998  . CHOLECYSTECTOMY    . CORONARY STENT PLACEMENT  10/09/2015   DES to RCA  . ESOPHAGOGASTRODUODENOSCOPY (EGD) WITH PROPOFOL Left 03/26/2017   Procedure: ESOPHAGOGASTRODUODENOSCOPY (EGD) WITH PROPOFOL;  Surgeon: Arta Silence, MD;  [facility]: [facility];  Service: Endoscopy;  Laterality: Left;  . LEFT HEART CATHETERIZATION WITH  CORONARY ANGIOGRAM N/A 09/15/2013   Procedure: LEFT HEART CATHETERIZATION WITH CORONARY ANGIOGRAM;  Surgeon: Peter M Martinique, MD;  Location: [facility];  Service: Cardiovascular;  Laterality: N/A;  . PROSTATECTOMY  1998   radical for prostate cancer    Current Medications: Outpatient Medications Prior to Visit  Medication Sig Dispense Refill  . Artificial Tear Solution (BION TEARS OP) Place 1 drop into both eyes daily as needed (dry eyes).    Marland Kitchen azithromycin (ZITHROMAX) 250 MG tablet Take 2 tablets by mouth on day 1, followed by 1 tablet by mouth daily for 4 days. 6 tablet 0  . carvedilol (COREG) 6.25 MG tablet TAKE 1 TABLET BY MOUTH TWICE A DAY WITH A MEAL 180 tablet 3  . clonazePAM (KLONOPIN) 0.5 MG tablet Take 1/4 or 1/2 tablet at bedtime as needed for insomnia 20 tablet 1  . ezetimibe (ZETIA) 10 MG tablet Take 1 tablet (10 mg total) by mouth daily. 90 tablet 3  . HYDROcodone-acetaminophen (NORCO/VICODIN) 5-325 MG tablet Take 0.5-1 tablets by mouth 2 (two) times daily as needed (pain). 60 tablet 0  . hydrocortisone 2.5 % cream Apply 1 application topically 2 (two) times daily.    Marland Kitchen ipratropium (ATROVENT) 0.03 % nasal spray Place 2 sprays into the nose 4 (four) times daily. 30 mL 5  . levothyroxine (SYNTHROID, LEVOTHROID) 112 MCG tablet Take 1 tablet (112 mcg total) by mouth daily. 30 tablet 6  . liothyronine (CYTOMEL) 5 MCG tablet Take 1 tablet (5 mcg total) by mouth every morning. 90 tablet 3  . losartan-hydrochlorothiazide (HYZAAR) 100-12.5 MG tablet TAKE 1 TABLET BY MOUTH EVERY EVENING. 90 tablet 2  . NEOMYCIN-POLYMYXIN-HYDROCORTISONE (CORTISPORIN) 1 % SOLN OTIC solution Place 3 drops into both ears every 8 (eight) hours. 10 mL 0  . nitroGLYCERIN (NITROLINGUAL) 0.4 MG/SPRAY spray Place 1 spray under the tongue every 5 (five) minutes x 3 doses as needed for chest pain. 12 g 12  . polyethylene glycol (MIRALAX / GLYCOLAX) packet Take 17 g by mouth daily. Mix in 8 oz liquid and drink    .  rivaroxaban (XARELTO) 20 MG TABS tablet Take 1 tablet (20 mg total) by mouth daily with supper. 30 tablet 6  . triamcinolone cream (KENALOG) 0.1 % Apply 1 application topically daily as needed (rash).    . VENTOLIN HFA 108 (90 Base) MCG/ACT inhaler INHALE 2 PUFFS INTO THE LUNGS EVERY 6 HRS AS NEEDED FOR WHEEZING OR SHORTNESS OF BREATH 18 Inhaler 0  . losartan (COZAAR) 100 MG tablet Take 100 mg by mouth at bedtime.    . pantoprazole (PROTONIX) 40 MG tablet Take 1 tablet (40 mg total) by mouth 2 (two) times daily. 180 tablet 3   No facility-administered medications prior to visit.      Allergies:   Ciprofloxacin; Codeine; Flexeril [cyclobenzaprine]; Methocarbamol; Sulfa antibiotics; Sulfamethoxazole; Sulfonamide derivatives; and Flagyl [metronidazole]   Social  History   Socioeconomic History  . Marital status: Married    Spouse name: None  . Number of children: None  . Years of education: None  . Highest education level: None  Social Needs  . Financial resource strain: None  . Food insecurity - worry: None  . Food insecurity - inability: None  . Transportation needs - medical: None  . Transportation needs - non-medical: None  Occupational History  . Occupation: retired    Comment: Actor [employer]  Tobacco Use  . Smoking status: Former Smoker    Packs/day: 2.50    Years: 30.00    Pack years: 75.00    Types: Cigarettes    Last attempt to quit: 08/28/1985    Years since quitting: 31.9  . Smokeless tobacco: Never Used  Substance and Sexual Activity  . Alcohol use: Yes    Alcohol/week: 1.2 oz    Types: 2 Standard drinks or equivalent per week    Comment: 1 glass wine/week (prior 1 glass martini with dinner)  . Drug use: No  . Sexual activity: Yes    Birth control/protection: None  Other Topics Concern  . None  Social History Narrative   Lives with wife--recently diagnosed with breast ca; No smoking; Occassional wine; Retired; 2 daughters live in [place]--5  grandchildren(boys). On weight watchers. Lives in [place] with wife. Retired Solicitor. Tobacco history 2 ppd x 32 years. No smoking x 25 years. No drugs.     Family History:  The patient's family history includes CAD in his child; Dementia in his sister; Hashimoto's thyroiditis in his child; Heart attack in his sister; Heart disease in his father, mother, and sister; Kidney failure in his father; Sjogren's syndrome in his child.   ROS:   Please see the history of present illness.    ROS All other systems reviewed and are negative.   PHYSICAL EXAM:   VS:  BP 120/74 (BP Location: Right Arm, Patient Position: Sitting, Cuff Size: Large)   Pulse 64   Ht 5\' 6"  (1.676 m)   Wt 221 lb (100.2 kg)   BMI 35.67 kg/m    GEN: Well nourished, well developed, in no acute distress  HEENT: normal  Neck: no JVD, carotid bruits, or masses Cardiac: RRR; no murmurs, rubs, or gallops,no edema  Respiratory:  clear to auscultation bilaterally, normal work of breathing GI: soft, nontender, nondistended, + BS MS: no deformity or atrophy  Skin: warm and dry, no rash Neuro:  Alert and Oriented x 3, Strength and sensation are intact Psych: euthymic mood, full affect  Wt Readings from Last 3 Encounters:  07/22/17 221 lb (100.2 kg)  07/19/17 220 lb (99.8 kg)  06/28/17 220 lb (99.8 kg)      Studies/Labs Reviewed:   EKG:  EKG is not ordered today.   Recent Labs: 04/05/2017: BUN 21; Creatinine, Ser 1.64; Hemoglobin 13.7; Platelets 150.0; Potassium 4.6; Sodium 141 06/28/2017: TSH 4.63 07/09/2017: ALT 26   Lipid Panel    Component Value Date/Time   CHOL 153 07/09/2017 0938   TRIG 198 (H) 07/09/2017 0938   HDL 38 (L) 07/09/2017 0938   CHOLHDL 4.0 07/09/2017 0938   CHOLHDL 3.3 03/24/2015 1159   VLDL 24 03/24/2015 1159   LDLCALC 75 07/09/2017 0938   LDLDIRECT 70 12/21/2012 1015    Additional studies/ records that were reviewed today include:   Cath 10/09/2015 Conclusion    Mid  RCA lesion, 40% stenosed.  Prox LAD to Mid LAD lesion, 20%  stenosed.  Ost 1st Mrg to 1st Mrg lesion, 40% stenosed.  The left ventricular systolic function is normal.  Prox RCA lesion, 70% stenosed. Post intervention, there is a 0% residual stenosis.   1. Single vessel obstructive CAD 2. Abnormal FFR of the proximal RCA lesion 3. Normal LV function 4. Successful stenting of the proximal RCA with a DES  Plan: DAPT for one year. Anticipate DC in am      ASSESSMENT:    1. AF (paroxysmal atrial fibrillation) ([place])   2. Essential hypertension   3. Hyperlipidemia, unspecified hyperlipidemia type   4. Coronary artery disease involving native coronary artery of native heart without angina pectoris   5. Carotid artery disease, unspecified laterality ([place])      PLAN:  In order of problems listed above:  1. Paroxysmal atrial fibrillation: CHA2DS2-VASC score 7 (age, TIA, HTN, CAD, PAD), maintaining sinus rhythm on carvedilol. Continue Xarelto. He does have some bruising but no recurrent GI bleed.  2. CAD: Aspirin has been discontinued, I will leave it off for now given the need for Xarelto.  He does have chronic stable angina class 2-3, this has not changed recently. Continue antianginal therapy.  3. Hypertension: Blood pressure well controlled.  4. Hyperlipidemia: Continue on Zetia, recent lipids are satisfactory.  5. Carotid artery disease: Followed by Dr. Scot Dock  6. History of TIA: No residual deficit    Medication Adjustments/Labs and Tests Ordered: Current medicines are reviewed at length with the patient today.  Concerns regarding medicines are outlined above.  Medication changes, Labs and Tests ordered today are listed in the Patient Instructions below. Patient Instructions  Continue your current therapy  I will see you in 6 months.      Signed, Peter Martinique, MD  07/22/2017 8:58 AM    [facility] [address], [place], [place]   [address] Phone: [phone]; Fax: [phone]

## 2017-07-19 ENCOUNTER — Ambulatory Visit: Payer: Medicare Other | Admitting: Medical

## 2017-07-19 ENCOUNTER — Encounter: Payer: Self-pay | Admitting: Medical

## 2017-07-19 VITALS — BP 107/51 | HR 73 | Temp 98.1°F | Resp 16 | Ht 66.0 in | Wt 220.0 lb

## 2017-07-19 DIAGNOSIS — H9201 Otalgia, right ear: Secondary | ICD-10-CM | POA: Diagnosis not present

## 2017-07-19 DIAGNOSIS — H6123 Impacted cerumen, bilateral: Secondary | ICD-10-CM | POA: Diagnosis not present

## 2017-07-19 DIAGNOSIS — H9203 Otalgia, bilateral: Secondary | ICD-10-CM | POA: Diagnosis not present

## 2017-07-19 MED ORDER — AZITHROMYCIN 250 MG PO TABS: ORAL_TABLET | ORAL | 0 refills | Status: DC

## 2017-07-19 MED ORDER — NEOMYCIN-POLYMYXIN-HC 1 % OT SOLN: 3.0000 [drp] | Freq: Three times a day (TID) | OTIC | 0 refills | Status: DC

## 2017-07-19 NOTE — Progress Notes (Signed)
Subjective:    Patient ID: [name], male    DOB: 1934/05/25, 84 y.o.   MRN: [id number]  HPI  Pt in states he was told ear plugged. Found by audiologist office today. No recent upper respiratory symptoms recently. Does have some chronic runny nose related to chronic allergies.  Hx of chronic recurrent  Cerumen impaction.  Does state rare occasional achiness to rt ear. Not severe.     Review of Systems  Constitutional: Negative for chills, fatigue and fever.  HENT: Positive for ear pain. Negative for congestion, ear discharge, facial swelling, hearing loss, nosebleeds, postnasal drip, rhinorrhea and sinus pain.   Respiratory: Negative for cough, chest tightness, shortness of breath and wheezing.   Cardiovascular: Negative for chest pain and palpitations.  Gastrointestinal: Negative for abdominal pain, blood in stool, constipation and diarrhea.  Musculoskeletal: Negative for arthralgias, back pain, joint swelling, myalgias and neck stiffness.  Skin: Negative for rash.  Neurological: Negative for dizziness, numbness and headaches.  Hematological: Negative for adenopathy. Does not bruise/bleed easily.  Psychiatric/Behavioral: Negative for behavioral problems, confusion, dysphoric mood, sleep disturbance and suicidal ideas.    Past Medical History:  Diagnosis Date  . Anginal pain ([place])   . Anxiety    " OCCASIONAL"  . Arthritis   . Asthma due to seasonal allergies    uses inhalers prn  . Carotid artery occlusion    Left  . Complication of anesthesia    " had tremors after prostate surgery  . Coronary artery disease   . Diverticulitis    recurrent  . Family history of anesthesia complication    MOTHER   . GERD (gastroesophageal reflux disease)   . H/O hiatal hernia   . Hearing aid worn   . Hyperlipemia   . Hypertension   . Kidney stone    lithotripsy 7858 w complications, req stents, Dr Rosana Hoes  . Prostate CA ([place])   . Shortness of breath   . Thyroid disease   .  TIA (transient ischemic attack)   . Tinnitus      Social History   Socioeconomic History  . Marital status: Married    Spouse name: Not on file  . Number of children: Not on file  . Years of education: Not on file  . Highest education level: Not on file  Social Needs  . Financial resource strain: Not on file  . Food insecurity - worry: Not on file  . Food insecurity - inability: Not on file  . Transportation needs - medical: Not on file  . Transportation needs - non-medical: Not on file  Occupational History  . Occupation: retired    Comment: Actor [employer]  Tobacco Use  . Smoking status: Former Smoker    Packs/day: 2.50    Years: 30.00    Pack years: 75.00    Types: Cigarettes    Last attempt to quit: 08/28/1985    Years since quitting: 31.9  . Smokeless tobacco: Never Used  Substance and Sexual Activity  . Alcohol use: Yes    Alcohol/week: 1.2 oz    Types: 2 Standard drinks or equivalent per week    Comment: 1 glass wine/week (prior 1 glass martini with dinner)  . Drug use: No  . Sexual activity: Yes    Birth control/protection: None  Other Topics Concern  . Not on file  Social History Narrative   Lives with wife--recently diagnosed with breast ca; No smoking; Occassional wine; Retired; 2 daughters live in [place]--5  grandchildren(boys). On weight watchers. 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 Tobacco history 2 ppd x 32 years. No smoking x 25 years. No drugs.    Past Surgical History:  Procedure Laterality Date  . CARDIAC CATHETERIZATION  2008   minimal dz, Dr Cathie Olden  . CARDIAC CATHETERIZATION N/A 10/09/2015   Procedure: Left Heart Cath and Coronary Angiography;  Surgeon: Peter M Martinique, MD;  Location: [facility] [facility];  Service: Cardiovascular;  Laterality: N/A;  . CARDIAC CATHETERIZATION N/A 10/09/2015   Procedure: Intravascular Pressure Wire/FFR Study;  Surgeon: Peter M Martinique, MD;  Location: [facility];   Service: Cardiovascular;  Laterality: N/A;  . CARDIAC CATHETERIZATION N/A 10/09/2015   Procedure: Coronary Stent Intervention;  Surgeon: Peter M Martinique, MD;  Location: [facility];  Service: Cardiovascular;  Laterality: N/A;  . CAROTID ENDARTERECTOMY  Left   1998  . CHOLECYSTECTOMY    . CORONARY STENT PLACEMENT  10/09/2015   DES to RCA  . ESOPHAGOGASTRODUODENOSCOPY (EGD) WITH PROPOFOL Left 03/26/2017   Procedure: ESOPHAGOGASTRODUODENOSCOPY (EGD) WITH PROPOFOL;  Surgeon: Arta Silence, MD;  Location: [facility];  Service: Endoscopy;  Laterality: Left;  . LEFT HEART CATHETERIZATION WITH CORONARY ANGIOGRAM N/A 09/15/2013   Procedure: LEFT HEART CATHETERIZATION WITH CORONARY ANGIOGRAM;  Surgeon: Peter M Martinique, MD;  Location: [facility];  Service: Cardiovascular;  Laterality: N/A;  . PROSTATECTOMY  1998   radical for prostate cancer    Family History  Problem Relation Age of Onset  . Heart disease Mother   . Heart disease Father   . Kidney failure Father   . Heart disease Sister   . Heart attack Sister   . Dementia Sister   . Sjogren's syndrome Child   . Hashimoto's thyroiditis Child   . CAD Child     Allergies  Allergen Reactions  . Ciprofloxacin Other (See Comments)    Hallucinations or jittery Hallucinations or jittery  . Codeine Other (See Comments)    hallucinations hallucinations  . Flexeril [Cyclobenzaprine]   . Methocarbamol   . Sulfa Antibiotics Other (See Comments)    Chills and shaking "serum sickness" Chills and shaking "serum sickness"  . Sulfamethoxazole Other (See Comments)    UNKNOWN REACTION, NOT DOCUMENTED  . Sulfonamide Derivatives Other (See Comments)    Chills and shaking "serum sickness"  . Flagyl [Metronidazole] Rash    Current Outpatient Medications on File Prior to Visit  Medication Sig Dispense Refill  . Artificial Tear Solution (BION TEARS OP) Place 1 drop into both eyes daily as needed (dry eyes).    . carvedilol (COREG) 6.25 MG  tablet TAKE 1 TABLET BY MOUTH TWICE A DAY WITH A MEAL 180 tablet 3  . clonazePAM (KLONOPIN) 0.5 MG tablet Take 1/4 or 1/2 tablet at bedtime as needed for insomnia 20 tablet 1  . ezetimibe (ZETIA) 10 MG tablet Take 1 tablet (10 mg total) by mouth daily. 90 tablet 3  . HYDROcodone-acetaminophen (NORCO/VICODIN) 5-325 MG tablet Take 0.5-1 tablets by mouth 2 (two) times daily as needed (pain). 60 tablet 0  . hydrocortisone 2.5 % cream Apply 1 application topically 2 (two) times daily.    Marland Kitchen ipratropium (ATROVENT) 0.03 % nasal spray Place 2 sprays into the nose 4 (four) times daily. 30 mL 5  . levothyroxine (SYNTHROID, LEVOTHROID) 112 MCG tablet Take 1 tablet (112 mcg total) by mouth daily. 30 tablet 6  . liothyronine (CYTOMEL) 5 MCG tablet Take 1 tablet (5 mcg total) by mouth every morning.  90 tablet 3  . losartan (COZAAR) 100 MG tablet Take 100 mg by mouth at bedtime.    Marland Kitchen losartan-hydrochlorothiazide (HYZAAR) 100-12.5 MG tablet TAKE 1 TABLET BY MOUTH EVERY EVENING. 90 tablet 2  . nitroGLYCERIN (NITROLINGUAL) 0.4 MG/SPRAY spray Place 1 spray under the tongue every 5 (five) minutes x 3 doses as needed for chest pain. 12 g 12  . pantoprazole (PROTONIX) 40 MG tablet Take 1 tablet (40 mg total) by mouth 2 (two) times daily. 180 tablet 3  . polyethylene glycol (MIRALAX / GLYCOLAX) packet Take 17 g by mouth daily. Mix in 8 oz liquid and drink    . rivaroxaban (XARELTO) 20 MG TABS tablet Take 1 tablet (20 mg total) by mouth daily with supper. 30 tablet 6  . triamcinolone cream (KENALOG) 0.1 % Apply 1 application topically daily as needed (rash).    . VENTOLIN HFA 108 (90 Base) MCG/ACT inhaler INHALE 2 PUFFS INTO THE LUNGS EVERY 6 HRS AS NEEDED FOR WHEEZING OR SHORTNESS OF BREATH 18 Inhaler 0   No current facility-administered medications on file prior to visit.     BP (!) 107/51   Pulse 73   Temp 98.1 F (36.7 C) (Oral)   Resp 16   Ht 5\' 6"  (1.676 m)   Wt 220 lb (99.8 kg)   SpO2 98%   BMI 35.51 kg/m        Objective:   Physical Exam  General  Mental Status - Alert. General Appearance - Well groomed. Not in acute distress.  Skin Rashes- No Rashes.  HEENT Head- Normal. Ear Auditory Canal - Left- Normal. Right - Normal.Tympanic Membrane- rt- severe rt cerumen impaction. No portion of tm seen left- moderate cerumen impaction(tried to use currette to remove wax in both canals but pt has too much pain and has some tragal tenderness as well.   Eye Sclera/Conjunctiva- Left- Normal. Right- Normal. Nose & Sinuses Nasal Mucosa- Left-  Boggy and Congested. Right-  Boggy and  Congested.Bilateral  No maxillary and no  frontal sinus pressure. Mouth & Throat Lips: Upper Lip- Normal: no dryness, cracking, pallor, cyanosis, or vesicular eruption. Lower Lip-Normal: no dryness, cracking, pallor, cyanosis or vesicular eruption. Buccal Mucosa- Bilateral- No Aphthous ulcers. Oropharynx- No Discharge or Erythema. Tonsils: Characteristics- Bilateral- No Erythema or Congestion. Size/Enlargement- Bilateral- No enlargement. Discharge- bilateral-None.  Neck Neck- Supple. No Masses.   Chest and Lung Exam Auscultation: Breath Sounds:-Clear even and unlabored.  Cardiovascular Auscultation:Rythm- Regular, rate and rhythm. Murmurs & Other Heart Sounds:Ausculatation of the heart reveal- No Murmurs.  Lymphatic Head & Neck General Head & Neck Lymphatics: Bilateral: Description- No Localized lymphadenopathy.       Assessment & Plan:  Your have severe wax obstruction both sides but worse on rt side. Very tender on attempted lavage. Will rx cortisporin otic drops. Also use debrox 2 hours after cortisporin otic drops to soften wax.  Concern for ear infection on rt side. Rx azithromycin.  Follow up on Friday 8:15 for ear lavage attempt again. If not successful will refer you to ent.  Ivann Trimarco, Percell Miller, PA-C

## 2017-07-19 NOTE — Patient Instructions (Addendum)
Your have severe wax obstruction both sides but worse on rt side. Very tender on attempted lavage. Will rx cortisporin otic drops. Also use debrox 2 hours after cortisporin otic drops to soften wax.  Concern for ear infection on rt side. Rx azithromycin.  Follow up on Friday 8:15 for ear lavage attempt again. If not successful will refer you to ent.

## 2017-07-20 ENCOUNTER — Ambulatory Visit: Payer: Medicare Other | Admitting: Physical Therapy

## 2017-07-20 ENCOUNTER — Encounter: Payer: Self-pay | Admitting: Physical Therapy

## 2017-07-20 DIAGNOSIS — M25511 Pain in right shoulder: Secondary | ICD-10-CM | POA: Diagnosis not present

## 2017-07-20 DIAGNOSIS — M25611 Stiffness of right shoulder, not elsewhere classified: Secondary | ICD-10-CM

## 2017-07-20 DIAGNOSIS — M25562 Pain in left knee: Secondary | ICD-10-CM

## 2017-07-20 DIAGNOSIS — R262 Difficulty in walking, not elsewhere classified: Secondary | ICD-10-CM

## 2017-07-20 NOTE — Therapy (Signed)
[address] [place], [place], [address] Phone: [phone]   Fax:  [phone]  Physical Therapy Treatment  Patient Details  Name: [name] MRN: [id number] Date of Birth: February 05, 1934 Referring Provider: Janett Billow Copland   Encounter Date: 07/20/2017  PT End of Session - 07/20/17 1230    Visit Number  3    Date for PT Re-Evaluation  09/09/17    PT Start Time  1145    PT Stop Time  1227    PT Time Calculation (min)  42 min    Activity Tolerance  Patient tolerated treatment well    Behavior During Therapy  [facility] for tasks assessed/performed       Past Medical History:  Diagnosis Date  . Anginal pain ([place])   . Anxiety    " OCCASIONAL"  . Arthritis   . Asthma due to seasonal allergies    uses inhalers prn  . Carotid artery occlusion    Left  . Complication of anesthesia    " had tremors after prostate surgery  . Coronary artery disease   . Diverticulitis    recurrent  . Family history of anesthesia complication    MOTHER   . GERD (gastroesophageal reflux disease)   . H/O hiatal hernia   . Hearing aid worn   . Hyperlipemia   . Hypertension   . Kidney stone    lithotripsy 7209 w complications, req stents, Dr Rosana Hoes  . Prostate CA ([place])   . Shortness of breath   . Thyroid disease   . TIA (transient ischemic attack)   . Tinnitus     Past Surgical History:  Procedure Laterality Date  . CARDIAC CATHETERIZATION  2008   minimal dz, Dr Cathie Olden  . CARDIAC CATHETERIZATION N/A 10/09/2015   Procedure: Left Heart Cath and Coronary Angiography;  Surgeon: Peter M Martinique, MD;  Location: [facility];  Service: Cardiovascular;  Laterality: N/A;  . CARDIAC CATHETERIZATION N/A 10/09/2015   Procedure: Intravascular Pressure Wire/FFR Study;  Surgeon: Peter M Martinique, MD;  Location: [facility];  Service: Cardiovascular;  Laterality: N/A;  . CARDIAC CATHETERIZATION N/A 10/09/2015   Procedure: Coronary  Stent Intervention;  Surgeon: Peter M Martinique, MD;  Location: [facility];  Service: Cardiovascular;  Laterality: N/A;  . CAROTID ENDARTERECTOMY  Left   1998  . CHOLECYSTECTOMY    . CORONARY STENT PLACEMENT  10/09/2015   DES to RCA  . ESOPHAGOGASTRODUODENOSCOPY (EGD) WITH PROPOFOL Left 03/26/2017   Procedure: ESOPHAGOGASTRODUODENOSCOPY (EGD) WITH PROPOFOL;  Surgeon: Arta Silence, MD;  Location: [facility];  Service: Endoscopy;  Laterality: Left;  . LEFT HEART CATHETERIZATION WITH CORONARY ANGIOGRAM N/A 09/15/2013   Procedure: LEFT HEART CATHETERIZATION WITH CORONARY ANGIOGRAM;  Surgeon: Peter M Martinique, MD;  Location: [facility];  Service: Cardiovascular;  Laterality: N/A;  . PROSTATECTOMY  1998   radical for prostate cancer    There were no vitals filed for this visit.  Subjective Assessment - 07/20/17 1150    Subjective  Pt reports taking a sleeping pill ar 3am last night and now he is sleepy,tired, and dizzy    Currently in Pain?  No/denies    Pain Score  0-No pain                      [facility] Adult PT Treatment/Exercise - 07/20/17 0001      Knee/Hip Exercises: Aerobic   Nustep  L 4  7 min      Knee/Hip Exercises: Standing   Other Standing Knee Exercises  standing march 2lb 2x10      Knee/Hip Exercises: Seated   Long Arc Quad  Both;10 reps;Weights;2 sets    Long Arc Quad Weight  2 lbs.    Ball Squeeze  20    Clamshell with TheraBand  Red 15    Hamstring Curl  Both;2 sets;15 reps    Hamstring Limitations  red tband      Shoulder Exercises: Standing   Flexion  10 reps;Weights;Both x2    Shoulder Flexion Weight (lbs)  1    ABduction  Both;10 reps;Weights x2    Shoulder ABduction Weight (lbs)  1    Extension  Strengthening;Both;15 reps;Weights cane    Extension Weight (lbs)  3      Shoulder Exercises: ROM/Strengthening   Other ROM/Strengthening Exercises  lat pull 15lb and row 20# 2 sets 10               PT Short Term Goals - 07/09/17 1049       PT SHORT TERM GOAL #1   Title  Demonstrate proper execution of HEP.     Time  2    Period  Weeks    Status  New        PT Long Term Goals - 07/09/17 1049      PT LONG TERM GOAL #1   Title  Decrease pain with activity to 5/10 at worst.     Time  8    Period  Weeks    Status  New      PT LONG TERM GOAL #2   Title  Increase AROM of right shoulder flexion to125 degrees.     Time  8    Period  Weeks    Status  New      PT LONG TERM GOAL #3   Title  Pt. will be able to put on his shirt with 50% less difficulty.     Time  8    Period  Weeks    Status  New      PT LONG TERM GOAL #4   Title  Pt will be able to reach opposite shoulder for bathing and personal hygiene.    Time  8    Period  Weeks    Status  New      PT LONG TERM GOAL #5   Title  Independent with gym transition    Time  8    Period  Weeks    Status  New            Plan - 07/20/17 1232    Clinical Impression Statement  Pt again very sleepy the entire session. Pt able to complete all of today's interventions, he does report some shoulder pain with flexion if he were to go above 90 degrees. Pt stated that he sits 12-15 hours a day, advises pt to be more active at home walking every thirty minutes    Rehab Potential  Good    PT Frequency  2x / week    PT Duration  8 weeks    PT Treatment/Interventions  ADLs/Self Care Home Management;Cryotherapy;Electrical Stimulation;Gait training;Moist Heat;Iontophoresis 4mg /ml Dexamethasone;Stair training;Functional mobility training;Therapeutic activities;Therapeutic exercise;Balance training;Neuromuscular re-education;Manual techniques;Patient/family education    PT Next Visit Plan  slowly add exercises, address the knee issue as well as the right shoulder       Patient will benefit from skilled therapeutic intervention  in order to improve the following deficits and impairments:  Decreased activity tolerance, Abnormal gait, Decreased mobility, Decreased strength,  Postural dysfunction, Improper body mechanics, Pain, Impaired UE functional use, Decreased range of motion, Difficulty walking  Visit Diagnosis: Acute pain of right shoulder  Stiffness of right shoulder, not elsewhere classified  Acute pain of left knee  Difficulty in walking, not elsewhere classified     Problem List Patient Active Problem List   Diagnosis Date Noted  . TIA (transient ischemic attack)   . Thyroid disease   . Shortness of breath   . Prostate CA ([place])   . Kidney stone   . Hypertension   . Hearing aid worn   . H/O hiatal hernia   . GERD (gastroesophageal reflux disease)   . Family history of anesthesia complication   . Diverticulitis   . Coronary artery disease   . Complication of anesthesia   . Asthma due to seasonal allergies   . Arthritis   . Anxiety   . Anginal pain ([place])   . Erosive gastropathy 03/26/2017  . Acute blood loss anemia 03/25/2017  . Melena 03/24/2017  . CKD (chronic kidney disease), stage III ([place]) 03/24/2017  . AF (paroxysmal atrial fibrillation) ([place]) 03/24/2017  . Angina pectoris ([place]) 10/09/2015  . Coronary artery disease due to lipid rich plaque   . Essential hypertension   . Hyperlipemia   . Carotid artery occlusion   . Dizziness 08/09/2015  . Gait instability 05/30/2015  . Myalgia   . Weakness   . Bloating   . Chest pain at rest 03/24/2015  . Atypical chest pain 03/24/2015  . Fatigue 03/14/2015  . Hypothyroidism, acquired 10/26/2013  . OSA (obstructive sleep apnea) 10/18/2013  . Well adult health check 01/02/2013  . Other symptoms involving urinary system(788.99) 12/21/2012  . Benign localized hyperplasia of prostate with urinary obstruction and other lower urinary tract symptoms (LUTS)(600.21) 12/21/2012  . Carcinoma in situ of prostate 12/21/2012  . Left shoulder pain 08/08/2012  . Obesity 07/24/2011  . Generalized anxiety disorder 07/24/2011  . DIVERTICULOSIS OF COLON 12/25/2009  . DERMATITIS, SEBORRHEIC  12/25/2009  . 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 Memory loss 08/14/2009  . BACK PAIN, LUMBAR 07/19/2009  . VENOUS INSUFFICIENCY, CHRONIC 09/09/2006  . GASTROESOPHAGEAL REFLUX, NO ESOPHAGITIS 09/09/2006  . HERNIA, HIATAL, NONCONGENITAL 09/09/2006  . INSOMNIA NOS 09/09/2006    Scot Jun, PTA 07/20/2017, 12:41 PM  [address] [place], [place], [address] Phone: [phone]   Fax:  [phone]  Name: SHEAN GERDING MRN: [id number] Date of Birth: [birth year]

## 2017-07-22 ENCOUNTER — Encounter: Payer: Medicare Other | Admitting: Physical Therapy

## 2017-07-22 ENCOUNTER — Ambulatory Visit: Payer: Medicare Other | Admitting: Cardiology

## 2017-07-22 ENCOUNTER — Encounter: Payer: Self-pay | Admitting: Cardiology

## 2017-07-22 VITALS — BP 120/74 | HR 64 | Ht 66.0 in | Wt 221.0 lb

## 2017-07-22 DIAGNOSIS — I779 Disorder of arteries and arterioles, unspecified: Secondary | ICD-10-CM

## 2017-07-22 DIAGNOSIS — I480 Paroxysmal atrial fibrillation: Secondary | ICD-10-CM | POA: Diagnosis not present

## 2017-07-22 DIAGNOSIS — E785 Hyperlipidemia, unspecified: Secondary | ICD-10-CM

## 2017-07-22 DIAGNOSIS — I10 Essential (primary) hypertension: Secondary | ICD-10-CM | POA: Diagnosis not present

## 2017-07-22 DIAGNOSIS — I2510 Atherosclerotic heart disease of native coronary artery without angina pectoris: Secondary | ICD-10-CM

## 2017-07-22 DIAGNOSIS — I739 Peripheral vascular disease, unspecified: Secondary | ICD-10-CM

## 2017-07-22 NOTE — Patient Instructions (Signed)
Continue your current therapy  I will see you in 6 months.   

## 2017-07-23 ENCOUNTER — Ambulatory Visit: Payer: Medicare Other | Admitting: Physical Therapy

## 2017-07-23 ENCOUNTER — Ambulatory Visit: Payer: Medicare Other | Admitting: Medical

## 2017-07-23 DIAGNOSIS — M25511 Pain in right shoulder: Secondary | ICD-10-CM | POA: Diagnosis not present

## 2017-07-23 DIAGNOSIS — M25611 Stiffness of right shoulder, not elsewhere classified: Secondary | ICD-10-CM

## 2017-07-23 DIAGNOSIS — R262 Difficulty in walking, not elsewhere classified: Secondary | ICD-10-CM

## 2017-07-23 DIAGNOSIS — M25562 Pain in left knee: Secondary | ICD-10-CM

## 2017-07-23 NOTE — Therapy (Signed)
[facility] [place], [place], [address] Phone: [phone]   Fax:  [phone]  Physical Therapy Treatment  Patient Details  Name: [name] MRN: [id number] Date of Birth: April 19, 1934 Referring Provider: Janett Billow Copland   Encounter Date: 07/23/2017  PT End of Session - 07/23/17 1111    Visit Number  4    Date for PT Re-Evaluation  09/09/17    PT Start Time  1058    PT Stop Time  1139    PT Time Calculation (min)  41 min       Past Medical History:  Diagnosis Date  . Anginal pain ([place])   . Anxiety    " OCCASIONAL"  . Arthritis   . Asthma due to seasonal allergies    uses inhalers prn  . Carotid artery occlusion    Left  . Complication of anesthesia    " had tremors after prostate surgery  . Coronary artery disease   . Diverticulitis    recurrent  . Family history of anesthesia complication    MOTHER   . GERD (gastroesophageal reflux disease)   . H/O hiatal hernia   . Hearing aid worn   . Hyperlipemia   . Hypertension   . Kidney stone    lithotripsy 3734 w complications, req stents, Dr Rosana Hoes  . Prostate CA ([place])   . Shortness of breath   . Thyroid disease   . TIA (transient ischemic attack)   . Tinnitus     Past Surgical History:  Procedure Laterality Date  . CARDIAC CATHETERIZATION  2008   minimal dz, Dr Cathie Olden  . CARDIAC CATHETERIZATION N/A 10/09/2015   Procedure: Left Heart Cath and Coronary Angiography;  Surgeon: Peter M Martinique, MD;  Location: [facility];  Service: Cardiovascular;  Laterality: N/A;  . CARDIAC CATHETERIZATION N/A 10/09/2015   Procedure: Intravascular Pressure Wire/FFR Study;  Surgeon: Peter M Martinique, MD;  Location: [facility];  Service: Cardiovascular;  Laterality: N/A;  . CARDIAC CATHETERIZATION N/A 10/09/2015   Procedure: Coronary Stent Intervention;  Surgeon: Peter M Martinique, MD;  Location: [facility];  Service: Cardiovascular;   Laterality: N/A;  . CAROTID ENDARTERECTOMY  Left   1998  . CHOLECYSTECTOMY    . CORONARY STENT PLACEMENT  10/09/2015   DES to RCA  . ESOPHAGOGASTRODUODENOSCOPY (EGD) WITH PROPOFOL Left 03/26/2017   Procedure: ESOPHAGOGASTRODUODENOSCOPY (EGD) WITH PROPOFOL;  Surgeon: Arta Silence, MD;  Location: [facility];  Service: Endoscopy;  Laterality: Left;  . LEFT HEART CATHETERIZATION WITH CORONARY ANGIOGRAM N/A 09/15/2013   Procedure: LEFT HEART CATHETERIZATION WITH CORONARY ANGIOGRAM;  Surgeon: Peter M Martinique, MD;  Location: [facility];  Service: Cardiovascular;  Laterality: N/A;  . PROSTATECTOMY  1998   radical for prostate cancer    There were no vitals filed for this visit.  Subjective Assessment - 07/23/17 1057    Subjective  shld same as ever, knee is better if I stay away from steps    Currently in Pain?  No/denies         [facility] PT Assessment - 07/23/17 0001      PROM   PROM Assessment Site  Shoulder    Right/Left Shoulder  Right    Right Shoulder Flexion  136 Degrees pt verb can go further but hurts                  [facility] Adult PT Treatment/Exercise - 07/23/17 0001  Knee/Hip Exercises: Aerobic   Nustep  L 4 7 min      Knee/Hip Exercises: Seated   Long Arc Quad  Both;10 reps;Weights;2 sets    Long Arc Quad Weight  3 lbs.    Marching  Both;2 sets;10 reps;Weights    Marching Weights  3 lbs.    Abduction/Adduction   Both;2 sets;10 reps;Weights    Abd/Adduction Weights  3 lbs.    Sit to Sand  10 reps;without UE support      Shoulder Exercises: Seated   Other Seated Exercises  cane ex flex, abd and chest press 3# 10 times      Shoulder Exercises: Standing   Other Standing Exercises  cane ex 3# ext and IR 10 times      Shoulder Exercises: ROM/Strengthening   UBE (Upper Arm Bike)  L 3 2 fwd/2back    Other ROM/Strengthening Exercises  lat pull 15lb and row 20# 2 sets 10               PT Short Term Goals - 07/23/17 1100      PT SHORT TERM GOAL  #1   Title  Demonstrate proper execution of HEP.     Baseline  can demo but verb inconsistancy with compliance    Status  Achieved        PT Long Term Goals - 07/09/17 1049      PT LONG TERM GOAL #1   Title  Decrease pain with activity to 5/10 at worst.     Time  8    Period  Weeks    Status  New      PT LONG TERM GOAL #2   Title  Increase AROM of right shoulder flexion to125 degrees.     Time  8    Period  Weeks    Status  New      PT LONG TERM GOAL #3   Title  Pt. will be able to put on his shirt with 50% less difficulty.     Time  8    Period  Weeks    Status  New      PT LONG TERM GOAL #4   Title  Pt will be able to reach opposite shoulder for bathing and personal hygiene.    Time  8    Period  Weeks    Status  New      PT LONG TERM GOAL #5   Title  Independent with gym transition    Time  8    Period  Weeks    Status  New            Plan - 07/23/17 1109    Clinical Impression Statement  STG met but verb inconsistancy. pt verb no pain in shld or knee unless pushes beyond confort zone. cuing needed for speed and control of ex.    PT Treatment/Interventions  ADLs/Self Care Home Management;Cryotherapy;Electrical Stimulation;Gait training;Moist Heat;Iontophoresis 78m/ml Dexamethasone;Stair training;Functional mobility training;Therapeutic activities;Therapeutic exercise;Balance training;Neuromuscular re-education;Manual techniques;Patient/family education    PT Next Visit Plan  ROM and func shld and knee       Patient will benefit from skilled therapeutic intervention in order to improve the following deficits and impairments:  Decreased activity tolerance, Abnormal gait, Decreased mobility, Decreased strength, Postural dysfunction, Improper body mechanics, Pain, Impaired UE functional use, Decreased range of motion, Difficulty walking  Visit Diagnosis: Acute pain of right shoulder  Stiffness of right shoulder, not elsewhere classified  Acute pain of  left  knee  Difficulty in walking, not elsewhere classified     Problem List Patient Active Problem List   Diagnosis Date Noted  . TIA (transient ischemic attack)   . Thyroid disease   . Shortness of breath   . Prostate CA ([place])   . Kidney stone   . Hypertension   . Hearing aid worn   . H/O hiatal hernia   . GERD (gastroesophageal reflux disease)   . Family history of anesthesia complication   . Diverticulitis   . Coronary artery disease   . Complication of anesthesia   . Asthma due to seasonal allergies   . Arthritis   . Anxiety   . Anginal pain ([place])   . Erosive gastropathy 03/26/2017  . Acute blood loss anemia 03/25/2017  . Melena 03/24/2017  . CKD (chronic kidney disease), stage III ([place]) 03/24/2017  . AF (paroxysmal atrial fibrillation) ([place]) 03/24/2017  . Angina pectoris ([place]) 10/09/2015  . Coronary artery disease due to lipid rich plaque   . Essential hypertension   . Hyperlipemia   . Carotid artery occlusion   . Dizziness 08/09/2015  . Gait instability 05/30/2015  . Myalgia   . Weakness   . Bloating   . Chest pain at rest 03/24/2015  . Atypical chest pain 03/24/2015  . Fatigue 03/14/2015  . Hypothyroidism, acquired 10/26/2013  . OSA (obstructive sleep apnea) 10/18/2013  . Well adult health check 01/02/2013  . Other symptoms involving urinary system(788.99) 12/21/2012  . Benign localized hyperplasia of prostate with urinary obstruction and other lower urinary tract symptoms (LUTS)(600.21) 12/21/2012  . Carcinoma in situ of prostate 12/21/2012  . Left shoulder pain 08/08/2012  . Obesity 07/24/2011  . Generalized anxiety disorder 07/24/2011  . DIVERTICULOSIS OF COLON 12/25/2009  . DERMATITIS, SEBORRHEIC 12/25/2009  . 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 Memory loss 08/14/2009  . BACK PAIN, LUMBAR 07/19/2009  . VENOUS INSUFFICIENCY, CHRONIC 09/09/2006  . GASTROESOPHAGEAL REFLUX, NO ESOPHAGITIS 09/09/2006  . HERNIA, HIATAL, NONCONGENITAL 09/09/2006  .  INSOMNIA NOS 09/09/2006    PAYSEUR,ANGIE PTA 07/23/2017, 11:26 AM  [address] [place], [place], [address] Phone: [phone]   Fax:  [phone]  Name: [name] MRN: [id number] Date of Birth: [birth year]

## 2017-07-26 ENCOUNTER — Ambulatory Visit: Payer: Medicare Other | Admitting: Medical

## 2017-07-26 ENCOUNTER — Encounter: Payer: Self-pay | Admitting: Medical

## 2017-07-26 VITALS — BP 116/68 | HR 66 | Temp 97.5°F | Resp 16 | Ht 66.0 in | Wt 218.8 lb

## 2017-07-26 DIAGNOSIS — H6122 Impacted cerumen, left ear: Secondary | ICD-10-CM

## 2017-07-26 DIAGNOSIS — H6121 Impacted cerumen, right ear: Secondary | ICD-10-CM | POA: Diagnosis not present

## 2017-07-26 NOTE — Patient Instructions (Addendum)
We got out wax from both sides. RT side almost 100% cleared. lt side removed a lot but some still present.  You can return to audiologist and see if they will do hearing test. If not then may need to see ENT to remove remainder of wax.  Follow up here as needed

## 2017-07-26 NOTE — Progress Notes (Signed)
Subjective:    Patient ID: [name], male    DOB: [birth year], 84 y.o.   MRN: [id number]  HPI  Pt is still having some ear blockage.  He has been using debrox as I recommended..  Pt is hesitant to remove the wax due t perceived pain. But after discussion he does want lavage today here and if not successful will go to ENT.  Pt audiologist wants wax removed adequately so he can do testing.    Review of Systems  Constitutional: Positive for chills. Negative for fatigue and fever.  HENT: Negative for congestion, ear pain, hearing loss, nosebleeds, rhinorrhea and sinus pain.        Ear wax on both sides.   Respiratory: Negative for cough, chest tightness, shortness of breath and wheezing.   Cardiovascular: Negative for chest pain and palpitations.  Musculoskeletal: Negative for back pain and myalgias.  Skin: Negative for rash.  Neurological: Negative for dizziness, weakness, light-headedness and numbness.  Psychiatric/Behavioral: Negative for behavioral problems, decreased concentration, hallucinations and suicidal ideas.    Past Medical History:  Diagnosis Date  . Anginal pain ([place])   . Anxiety    " OCCASIONAL"  . Arthritis   . Asthma due to seasonal allergies    uses inhalers prn  . Carotid artery occlusion    Left  . Complication of anesthesia    " had tremors after prostate surgery  . Coronary artery disease   . Diverticulitis    recurrent  . Family history of anesthesia complication    MOTHER   . GERD (gastroesophageal reflux disease)   . H/O hiatal hernia   . Hearing aid worn   . Hyperlipemia   . Hypertension   . Kidney stone    lithotripsy 2409 w complications, req stents, Dr Rosana Hoes  . Prostate CA ([place])   . Shortness of breath   . Thyroid disease   . TIA (transient ischemic attack)   . Tinnitus      Social History   Socioeconomic History  . Marital status: Married    Spouse name: Not on file  . Number of children: Not on file  . Years of education:  Not on file  . Highest education level: Not on file  Social Needs  . Financial resource strain: Not on file  . Food insecurity - worry: Not on file  . Food insecurity - inability: Not on file  . Transportation needs - medical: Not on file  . Transportation needs - non-medical: Not on file  Occupational History  . Occupation: retired    Comment: Actor [employer]  Tobacco Use  . Smoking status: Former Smoker    Packs/day: 2.50    Years: 30.00    Pack years: 75.00    Types: Cigarettes    Last attempt to quit: 08/28/1985    Years since quitting: 31.9  . Smokeless tobacco: Never Used  Substance and Sexual Activity  . Alcohol use: Yes    Alcohol/week: 1.2 oz    Types: 2 Standard drinks or equivalent per week    Comment: 1 glass wine/week (prior 1 glass martini with dinner)  . Drug use: No  . Sexual activity: Yes    Birth control/protection: None  Other Topics Concern  . Not on file  Social History Narrative   Lives with wife--recently diagnosed with breast ca; No smoking; Occassional wine; Retired; 2 daughters live in [place]--5 grandchildren(boys). On weight watchers. Lives in [place] with wife. Retired Solicitor.  Tobacco history 2 ppd x 32 years. No smoking x 25 years. No drugs.    Past Surgical History:  Procedure Laterality Date  . CARDIAC CATHETERIZATION  2008   minimal dz, Dr Cathie Olden  . CARDIAC CATHETERIZATION N/A 10/09/2015   Procedure: Left Heart Cath and Coronary Angiography;  Surgeon: Peter M Martinique, MD;  Location: [facility];  Service: Cardiovascular;  Laterality: N/A;  . CARDIAC CATHETERIZATION N/A 10/09/2015   Procedure: Intravascular Pressure Wire/FFR Study;  Surgeon: Peter M Martinique, MD;  Location: [facility];  Service: Cardiovascular;  Laterality: N/A;  . CARDIAC CATHETERIZATION N/A 10/09/2015   Procedure: Coronary Stent Intervention;  Surgeon: Peter M Martinique, MD;  Location: [facility];  Service: Cardiovascular;   Laterality: N/A;  . CAROTID ENDARTERECTOMY  Left   1998  . CHOLECYSTECTOMY    . CORONARY STENT PLACEMENT  10/09/2015   DES to RCA  . ESOPHAGOGASTRODUODENOSCOPY (EGD) WITH PROPOFOL Left 03/26/2017   Procedure: ESOPHAGOGASTRODUODENOSCOPY (EGD) WITH PROPOFOL;  Surgeon: Arta Silence, MD;  [facility];  Service: Endoscopy;  Laterality: Left;  . LEFT HEART CATHETERIZATION WITH CORONARY ANGIOGRAM N/A 09/15/2013   Procedure: LEFT HEART CATHETERIZATION WITH CORONARY ANGIOGRAM;  Surgeon: Peter M Martinique, MD;  Location: [facility];  Service: Cardiovascular;  Laterality: N/A;  . PROSTATECTOMY  1998   radical for prostate cancer    Family History  Problem Relation Age of Onset  . Heart disease Mother   . Heart disease Father   . Kidney failure Father   . Heart disease Sister   . Heart attack Sister   . Dementia Sister   . Sjogren's syndrome Child   . Hashimoto's thyroiditis Child   . CAD Child     Allergies  Allergen Reactions  . Ciprofloxacin Other (See Comments)    Hallucinations or jittery Hallucinations or jittery  . Codeine Other (See Comments)    hallucinations hallucinations  . Flexeril [Cyclobenzaprine]   . Methocarbamol   . Sulfa Antibiotics Other (See Comments)    Chills and shaking "serum sickness" Chills and shaking "serum sickness"  . Sulfamethoxazole Other (See Comments)    UNKNOWN REACTION, NOT DOCUMENTED  . Sulfonamide Derivatives Other (See Comments)    Chills and shaking "serum sickness"  . Flagyl [Metronidazole] Rash    Current Outpatient Medications on File Prior to Visit  Medication Sig Dispense Refill  . Artificial Tear Solution (BION TEARS OP) Place 1 drop into both eyes daily as needed (dry eyes).    Marland Kitchen azithromycin (ZITHROMAX) 250 MG tablet Take 2 tablets by mouth on day 1, followed by 1 tablet by mouth daily for 4 days. 6 tablet 0  . carvedilol (COREG) 6.25 MG tablet TAKE 1 TABLET BY MOUTH TWICE A DAY WITH A MEAL 180 tablet 3  . clonazePAM  (KLONOPIN) 0.5 MG tablet Take 1/4 or 1/2 tablet at bedtime as needed for insomnia 20 tablet 1  . ezetimibe (ZETIA) 10 MG tablet Take 1 tablet (10 mg total) by mouth daily. 90 tablet 3  . HYDROcodone-acetaminophen (NORCO/VICODIN) 5-325 MG tablet Take 0.5-1 tablets by mouth 2 (two) times daily as needed (pain). 60 tablet 0  . hydrocortisone 2.5 % cream Apply 1 application topically 2 (two) times daily.    Marland Kitchen ipratropium (ATROVENT) 0.03 % nasal spray Place 2 sprays into the nose 4 (four) times daily. 30 mL 5  . levothyroxine (SYNTHROID, LEVOTHROID) 112 MCG tablet Take 1 tablet (112 mcg total) by mouth daily. 30 tablet 6  .  liothyronine (CYTOMEL) 5 MCG tablet Take 1 tablet (5 mcg total) by mouth every morning. 90 tablet 3  . losartan-hydrochlorothiazide (HYZAAR) 100-12.5 MG tablet TAKE 1 TABLET BY MOUTH EVERY EVENING. 90 tablet 2  . NEOMYCIN-POLYMYXIN-HYDROCORTISONE (CORTISPORIN) 1 % SOLN OTIC solution Place 3 drops into both ears every 8 (eight) hours. 10 mL 0  . nitroGLYCERIN (NITROLINGUAL) 0.4 MG/SPRAY spray Place 1 spray under the tongue every 5 (five) minutes x 3 doses as needed for chest pain. 12 g 12  . polyethylene glycol (MIRALAX / GLYCOLAX) packet Take 17 g by mouth daily. Mix in 8 oz liquid and drink    . rivaroxaban (XARELTO) 20 MG TABS tablet Take 1 tablet (20 mg total) by mouth daily with supper. 30 tablet 6  . triamcinolone cream (KENALOG) 0.1 % Apply 1 application topically daily as needed (rash).    . VENTOLIN HFA 108 (90 Base) MCG/ACT inhaler INHALE 2 PUFFS INTO THE LUNGS EVERY 6 HRS AS NEEDED FOR WHEEZING OR SHORTNESS OF BREATH 18 Inhaler 0   No current facility-administered medications on file prior to visit.     BP 116/68   Pulse 66   Temp (!) 97.5 F (36.4 C) (Oral)   Resp 16   Ht 5\' 6"  (1.676 m)   Wt 218 lb 12.8 oz (99.2 kg)   SpO2 99%   BMI 35.32 kg/m       Objective:   Physical Exam  General  Mental Status - Alert. General Appearance - Well groomed. Not in  acute distress.  Skin Rashes- No Rashes.  HEENT Head- Normal. Ear Auditory Canal - Left-moderate wax obstruction. Right -severe wax obstruction..Tympanic Membrane- Left- Normal. Right- Normal.(Post lavage TMs did look normal.  Both sides wax was difficult to remove.  LPN can irrigated out the wax and note need to use curette to remove the wax as well.) Note intermittently on both sides after LPN lavaged I scooped moderate sized portions of wax on both sides. Eye Sclera/Conjunctiva- Left- Normal. Right- Normal. Nose & Sinuses Nasal Mucosa- Left-  Boggy and Congested. Right-  Boggy and  Congested.Bilateral maxillary and frontal sinus pressure. Mouth & Throat Lips: Upper Lip- Normal: no dryness, cracking, pallor, cyanosis, or vesicular eruption. Lower Lip-Normal: no dryness, cracking, pallor, cyanosis or vesicular eruption. Buccal Mucosa- Bilateral- No Aphthous ulcers. Oropharynx- No Discharge or Erythema. Tonsils: Characteristics- Bilateral- No Erythema or Congestion. Size/Enlargement- Bilateral- No enlargement. Discharge- bilateral-None.  Neck Neck- Supple. No Masses.   Chest and Lung Exam Auscultation: Breath Sounds:-Clear even and unlabored.  Cardiovascular Auscultation:Rythm- Regular, rate and rhythm. Murmurs & Other Heart Sounds:Ausculatation of the heart reveal- No Murmurs.  Lymphatic Head & Neck General Head & Neck Lymphatics: Bilateral: Description- No Localized lymphadenopathy.       Assessment & Plan:  We got out wax from both sides. RT side almost 100% cleared. lt side removed a lot but some still present.  You can return to audiologist and see if they will do hearing test. If not then may need to see ENT to remove remainder of wax.  Follow up here as needed  See ear exam as it explains lavaged by LPN and removal with curette by myself was needed to remove cerumen today.  Karla Vines, Percell Miller, PA-C

## 2017-07-27 ENCOUNTER — Ambulatory Visit: Payer: Medicare Other | Admitting: Physical Therapy

## 2017-07-27 DIAGNOSIS — M25511 Pain in right shoulder: Secondary | ICD-10-CM | POA: Diagnosis not present

## 2017-07-27 DIAGNOSIS — M25611 Stiffness of right shoulder, not elsewhere classified: Secondary | ICD-10-CM

## 2017-07-27 DIAGNOSIS — M25562 Pain in left knee: Secondary | ICD-10-CM

## 2017-07-27 NOTE — Therapy (Signed)
[address] [place], [place], [address] Phone: [phone]   Fax:  [phone]  Physical Therapy Treatment  Patient Details  Name: [name] MRN: [id number] Date of Birth: July 18, 1933 Referring Provider: Lamar Blinks   Encounter Date: 07/27/2017  PT End of Session - 07/27/17 1212    Visit Number  5    Date for PT Re-Evaluation  09/09/17    PT Start Time  1138    PT Stop Time  1218    PT Time Calculation (min)  40 min       Past Medical History:  Diagnosis Date  . Anginal pain ([place])   . Anxiety    " OCCASIONAL"  . Arthritis   . Asthma due to seasonal allergies    uses inhalers prn  . Carotid artery occlusion    Left  . Complication of anesthesia    " had tremors after prostate surgery  . Coronary artery disease   . Diverticulitis    recurrent  . Family history of anesthesia complication    MOTHER   . GERD (gastroesophageal reflux disease)   . H/O hiatal hernia   . Hearing aid worn   . Hyperlipemia   . Hypertension   . Kidney stone    lithotripsy 6144 w complications, req stents, Dr Rosana Hoes  . Prostate CA ([place])   . Shortness of breath   . Thyroid disease   . TIA (transient ischemic attack)   . Tinnitus     Past Surgical History:  Procedure Laterality Date  . CARDIAC CATHETERIZATION  2008   minimal dz, Dr Cathie Olden  . CARDIAC CATHETERIZATION N/A 10/09/2015   Procedure: Left Heart Cath and Coronary Angiography;  Surgeon: Peter M Martinique, MD;  Location: [place] [facility];  Service: Cardiovascular;  Laterality: N/A;  . CARDIAC CATHETERIZATION N/A 10/09/2015   Procedure: Intravascular Pressure Wire/FFR Study;  Surgeon: Peter M Martinique, MD;  Location: [facility];  Service: Cardiovascular;  Laterality: N/A;  . CARDIAC CATHETERIZATION N/A 10/09/2015   Procedure: Coronary Stent Intervention;  Surgeon: Peter M Martinique, MD;  Location: [facility];  Service: Cardiovascular;   Laterality: N/A;  . CAROTID ENDARTERECTOMY  Left   1998  . CHOLECYSTECTOMY    . CORONARY STENT PLACEMENT  10/09/2015   DES to RCA  . ESOPHAGOGASTRODUODENOSCOPY (EGD) WITH PROPOFOL Left 03/26/2017   Procedure: ESOPHAGOGASTRODUODENOSCOPY (EGD) WITH PROPOFOL;  Surgeon: Arta Silence, MD;  [facility];  Service: Endoscopy;  Laterality: Left;  . LEFT HEART CATHETERIZATION WITH CORONARY ANGIOGRAM N/A 09/15/2013   Procedure: LEFT HEART CATHETERIZATION WITH CORONARY ANGIOGRAM;  Surgeon: Peter M Martinique, MD;  Location: [facility] CATH LAB;  Service: Cardiovascular;  Laterality: N/A;  . PROSTATECTOMY  1998   radical for prostate cancer    There were no vitals filed for this visit.  Subjective Assessment - 07/27/17 1144    Subjective  woke up feeling good, now I am tired. knee still hurts if I push up with it    Currently in Pain?  No/denies                      [facility] Adult PT Treatment/Exercise - 07/27/17 0001      Knee/Hip Exercises: Aerobic   Nustep  L 5 6 min      Knee/Hip Exercises: Machines for Strengthening   Cybex Knee Extension  5# 3 sets 8    Cybex Knee Flexion  15# 2 sets 10      Knee/Hip Exercises: Seated   Ball Squeeze  20    Clamshell with TheraBand  Green    Other Seated Knee/Hip Exercises  marching green tband    Sit to Sand  10 reps;without UE support      Shoulder Exercises: Seated   Other Seated Exercises  red tband scap stab 3 ways 10 each,bicep curl 2 sets 10      Shoulder Exercises: ROM/Strengthening   Other ROM/Strengthening Exercises  lat pull and  row 20# 2 sets 10               PT Short Term Goals - 07/23/17 1100      PT SHORT TERM GOAL #1   Title  Demonstrate proper execution of HEP.     Baseline  can demo but verb inconsistancy with compliance    Status  Achieved        PT Long Term Goals - 07/09/17 1049      PT LONG TERM GOAL #1   Title  Decrease pain with activity to 5/10 at worst.     Time  8    Period  Weeks     Status  New      PT LONG TERM GOAL #2   Title  Increase AROM of right shoulder flexion to125 degrees.     Time  8    Period  Weeks    Status  New      PT LONG TERM GOAL #3   Title  Pt. will be able to put on his shirt with 50% less difficulty.     Time  8    Period  Weeks    Status  New      PT LONG TERM GOAL #4   Title  Pt will be able to reach opposite shoulder for bathing and personal hygiene.    Time  8    Period  Weeks    Status  New      PT LONG TERM GOAL #5   Title  Independent with gym transition    Time  8    Period  Weeks    Status  New            Plan - 07/27/17 1213    Clinical Impression Statement  pt tolerate dincreased ther ex ,add'l wt and added LE wt machines. some cuing needed for speed and control    PT Treatment/Interventions  ADLs/Self Care Home Management;Cryotherapy;Electrical Stimulation;Gait training;Moist Heat;Iontophoresis 4mg /ml Dexamethasone;Stair training;Functional mobility training;Therapeutic activities;Therapeutic exercise;Balance training;Neuromuscular re-education;Manual techniques;Patient/family education    PT Next Visit Plan  issue HEP       Patient will benefit from skilled therapeutic intervention in order to improve the following deficits and impairments:  Decreased activity tolerance, Abnormal gait, Decreased mobility, Decreased strength, Postural dysfunction, Improper body mechanics, Pain, Impaired UE functional use, Decreased range of motion, Difficulty walking  Visit Diagnosis: Acute pain of right shoulder  Stiffness of right shoulder, not elsewhere classified  Acute pain of left knee     Problem List Patient Active Problem List   Diagnosis Date Noted  . TIA (transient ischemic attack)   . Thyroid disease   . Shortness of breath   . Prostate CA ([place])   . Kidney stone   . Hypertension   . Hearing aid worn   . H/O hiatal hernia   . GERD (gastroesophageal reflux disease)   . Family history of anesthesia  complication   .  Diverticulitis   . Coronary artery disease   . Complication of anesthesia   . Asthma due to seasonal allergies   . Arthritis   . Anxiety   . Anginal pain ([place])   . Erosive gastropathy 03/26/2017  . Acute blood loss anemia 03/25/2017  . Melena 03/24/2017  . CKD (chronic kidney disease), stage III ([place]) 03/24/2017  . AF (paroxysmal atrial fibrillation) ([place]) 03/24/2017  . Angina pectoris ([place]) 10/09/2015  . Coronary artery disease due to lipid rich plaque   . Essential hypertension   . Hyperlipemia   . Carotid artery occlusion   . Dizziness 08/09/2015  . Gait instability 05/30/2015  . Myalgia   . Weakness   . Bloating   . Chest pain at rest 03/24/2015  . Atypical chest pain 03/24/2015  . Fatigue 03/14/2015  . Hypothyroidism, acquired 10/26/2013  . OSA (obstructive sleep apnea) 10/18/2013  . Well adult health check 01/02/2013  . Other symptoms involving urinary system(788.99) 12/21/2012  . Benign localized hyperplasia of prostate with urinary obstruction and other lower urinary tract symptoms (LUTS)(600.21) 12/21/2012  . Carcinoma in situ of prostate 12/21/2012  . Left shoulder pain 08/08/2012  . Obesity 07/24/2011  . Generalized anxiety disorder 07/24/2011  . DIVERTICULOSIS OF COLON 12/25/2009  . DERMATITIS, SEBORRHEIC 12/25/2009  . 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 Memory loss 08/14/2009  . BACK PAIN, LUMBAR 07/19/2009  . VENOUS INSUFFICIENCY, CHRONIC 09/09/2006  . GASTROESOPHAGEAL REFLUX, NO ESOPHAGITIS 09/09/2006  . HERNIA, HIATAL, NONCONGENITAL 09/09/2006  . INSOMNIA NOS 09/09/2006    Laraya Pestka,ANGIE PTA 07/27/2017, 12:14 PM  [address] [place], [place], [address] Phone: [phone]   Fax:  [phone]  Name: [name] MRN: [id number] Date of Birth: 1934/03/03

## 2017-07-29 ENCOUNTER — Telehealth: Payer: Self-pay | Admitting: *Deleted

## 2017-07-29 MED ORDER — OMEGA-3-ACID ETHYL ESTERS 1 G PO CAPS: 1.0000 g | ORAL_CAPSULE | Freq: Two times a day (BID) | ORAL | 11 refills | Status: DC

## 2017-07-29 NOTE — Telephone Encounter (Signed)
PATIENT CALLING BACK STATES HE RECEIVED A LETTER CONCERNING LAB RESULTS  LAB RESULT GIVEN  --PATIENT STATES HE IS NOT TAKING ANY FISH OIL AWARE TO START TAKING [place] PRESCRIPTION SENT TO PHARMACY-- INFORMED PATIENT TO CALL PHARMACY FIRST TO SEE IF MEDICATION IS AVAILABLE OR PRIOR AUTHORIZATION IS NEEDED BEFORE PATIENT CAN PICK UP [place]  PATIENT VERBALIZED UNDERSTANDING.

## 2017-07-30 ENCOUNTER — Ambulatory Visit: Payer: Medicare Other | Admitting: Physical Therapy

## 2017-07-30 DIAGNOSIS — R262 Difficulty in walking, not elsewhere classified: Secondary | ICD-10-CM

## 2017-07-30 DIAGNOSIS — M25511 Pain in right shoulder: Secondary | ICD-10-CM | POA: Diagnosis not present

## 2017-07-30 DIAGNOSIS — M25562 Pain in left knee: Secondary | ICD-10-CM

## 2017-07-30 DIAGNOSIS — M25611 Stiffness of right shoulder, not elsewhere classified: Secondary | ICD-10-CM

## 2017-07-30 NOTE — Therapy (Signed)
[address] [place], [place], [address] Phone: [phone]   Fax:  [phone]  Physical Therapy Treatment  Patient Details  Name: [name] MRN: [id number] Date of Birth: December 26, 1933 Referring Provider: Janett Billow Copland   Encounter Date: 07/30/2017  PT End of Session - 07/30/17 1037    Visit Number  6    Date for PT Re-Evaluation  09/09/17    PT Start Time  2229    PT Stop Time  1056    PT Time Calculation (min)  41 min       Past Medical History:  Diagnosis Date  . Anginal pain ([place])   . Anxiety    " OCCASIONAL"  . Arthritis   . Asthma due to seasonal allergies    uses inhalers prn  . Carotid artery occlusion    Left  . Complication of anesthesia    " had tremors after prostate surgery  . Coronary artery disease   . Diverticulitis    recurrent  . Family history of anesthesia complication    MOTHER   . GERD (gastroesophageal reflux disease)   . H/O hiatal hernia   . Hearing aid worn   . Hyperlipemia   . Hypertension   . Kidney stone    lithotripsy 7989 w complications, req stents, Dr Rosana Hoes  . Prostate CA ([place])   . Shortness of breath   . Thyroid disease   . TIA (transient ischemic attack)   . Tinnitus     Past Surgical History:  Procedure Laterality Date  . CARDIAC CATHETERIZATION  2008   minimal dz, Dr Cathie Olden  . CARDIAC CATHETERIZATION N/A 10/09/2015   Procedure: Left Heart Cath and Coronary Angiography;  Surgeon: Peter M Martinique, MD;  Location: [facility];  Service: Cardiovascular;  Laterality: N/A;  . CARDIAC CATHETERIZATION N/A 10/09/2015   Procedure: Intravascular Pressure Wire/FFR Study;  Surgeon: Peter M Martinique, MD;  Location: [facility];  Service: Cardiovascular;  Laterality: N/A;  . CARDIAC CATHETERIZATION N/A 10/09/2015   Procedure: Coronary Stent Intervention;  Surgeon: Peter M Martinique, MD;  Location: [facility];  Service: Cardiovascular;   Laterality: N/A;  . CAROTID ENDARTERECTOMY  Left   1998  . CHOLECYSTECTOMY    . CORONARY STENT PLACEMENT  10/09/2015   DES to RCA  . ESOPHAGOGASTRODUODENOSCOPY (EGD) WITH PROPOFOL Left 03/26/2017   Procedure: ESOPHAGOGASTRODUODENOSCOPY (EGD) WITH PROPOFOL;  Surgeon: Arta Silence, MD;  [facility];  Service: Endoscopy;  Laterality: Left;  . LEFT HEART CATHETERIZATION WITH CORONARY ANGIOGRAM N/A 09/15/2013   Procedure: LEFT HEART CATHETERIZATION WITH CORONARY ANGIOGRAM;  Surgeon: Peter M Martinique, MD;  Location: [facility] C[facility] LAB;  Service: Cardiovascular;  Laterality: N/A;  . PROSTATECTOMY  1998   radical for prostate cancer    There were no vitals filed for this visit.  Subjective Assessment - 07/30/17 1011    Subjective  "very tired,not awake- numb so nothing hurts"    Currently in Pain?  No/denies                      [facility] Adult PT Treatment/Exercise - 07/30/17 0001      Knee/Hip Exercises: Aerobic   Nustep  L 5 6 min      Knee/Hip Exercises: Machines for Strengthening   Cybex Knee Extension  5# 2 sets 10    Cybex Knee Flexion  15# 2 sets 10      Knee/Hip  Exercises: Seated   Ball Squeeze  20    Clamshell with TheraBand  Green    Other Seated Knee/Hip Exercises  marching green tband      Shoulder Exercises: Standing   Other Standing Exercises  cane ex 3# ext ,IR, flex,abd,chest press 10 times      Shoulder Exercises: ROM/Strengthening   UBE (Upper Arm Bike)  L 3 3 fwd/3back    Other ROM/Strengthening Exercises  lat pull and  row 20# 2 sets 10               PT Short Term Goals - 07/23/17 1100      PT SHORT TERM GOAL #1   Title  Demonstrate proper execution of HEP.     Baseline  can demo but verb inconsistancy with compliance    Status  Achieved        PT Long Term Goals - 07/30/17 1033      PT LONG TERM GOAL #1   Title  Decrease pain with activity to 5/10 at worst.     Status  On-going      PT LONG TERM GOAL #2   Title  Increase  AROM of right shoulder flexion to125 degrees.     Baseline  Rt shld flex 132 . pain with eccentric lowering    Status  Partially Met      PT LONG TERM GOAL #3   Title  Pt. will be able to put on his shirt with 50% less difficulty.     Baseline  25% better    Status  Partially Met      PT LONG TERM GOAL #4   Title  Pt will be able to reach opposite shoulder for bathing and personal hygiene.    Status  Partially Met            Plan - 07/30/17 1037    Clinical Impression Statement  improved shld ROM, pt reports no pain when arrives but c/o pain with exercises. pt needs constant cuing to stay on task and correct tech speed and control with ex. progresisng with goals. pt verb he wants more ex for home but NOT doing prior ex issued. asked pt to bring in ex already given so we can condense and review with him    PT Treatment/Interventions  ADLs/Self Care Home Management;Cryotherapy;Electrical Stimulation;Gait training;Moist Heat;Iontophoresis 72m/ml Dexamethasone;Stair training;Functional mobility training;Therapeutic activities;Therapeutic exercise;Balance training;Neuromuscular re-education;Manual techniques;Patient/family education    PT Next Visit Plan  Made appts for afternoons to try to increase pt aleartness. asked pt to bring ex so we can condense HEP for him       Patient will benefit from skilled therapeutic intervention in order to improve the following deficits and impairments:  Decreased activity tolerance, Abnormal gait, Decreased mobility, Decreased strength, Postural dysfunction, Improper body mechanics, Pain, Impaired UE functional use, Decreased range of motion, Difficulty walking  Visit Diagnosis: Acute pain of right shoulder  Stiffness of right shoulder, not elsewhere classified  Acute pain of left knee  Difficulty in walking, not elsewhere classified     Problem List Patient Active Problem List   Diagnosis Date Noted  . TIA (transient ischemic attack)   .  Thyroid disease   . Shortness of breath   . Prostate CA ([place]   . Kidney stone   . Hypertension   . Hearing aid worn   . H/O hiatal hernia   . GERD (gastroesophageal reflux disease)   . Family history of anesthesia complication   .  Diverticulitis   . Coronary artery disease   . Complication of anesthesia   . Asthma due to seasonal allergies   . Arthritis   . Anxiety   . Anginal pain ([place])   . Erosive gastropathy 03/26/2017  . Acute blood loss anemia 03/25/2017  . Melena 03/24/2017  . CKD (chronic kidney disease), stage III ([place]) 03/24/2017  . AF (paroxysmal atrial fibrillation) ([place]) 03/24/2017  . Angina pectoris ([place]) 10/09/2015  . Coronary artery disease due to lipid rich plaque   . Essential hypertension   . Hyperlipemia   . Carotid artery occlusion   . Dizziness 08/09/2015  . Gait instability 05/30/2015  . Myalgia   . Weakness   . Bloating   . Chest pain at rest 03/24/2015  . Atypical chest pain 03/24/2015  . Fatigue 03/14/2015  . Hypothyroidism, acquired 10/26/2013  . OSA (obstructive sleep apnea) 10/18/2013  . Well adult health check 01/02/2013  . Other symptoms involving urinary system(788.99) 12/21/2012  . Benign localized hyperplasia of prostate with urinary obstruction and other lower urinary tract symptoms (LUTS)(600.21) 12/21/2012  . Carcinoma in situ of prostate 12/21/2012  . Left shoulder pain 08/08/2012  . Obesity 07/24/2011  . Generalized anxiety disorder 07/24/2011  . DIVERTICULOSIS OF COLON 12/25/2009  . DERMATITIS, SEBORRHEIC 12/25/2009  . 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 Memory loss 08/14/2009  . BACK PAIN, LUMBAR 07/19/2009  . VENOUS INSUFFICIENCY, CHRONIC 09/09/2006  . GASTROESOPHAGEAL REFLUX, NO ESOPHAGITIS 09/09/2006  . HERNIA, HIATAL, NONCONGENITAL 09/09/2006  . INSOMNIA NOS 09/09/2006    Lourie Retz,ANGIE PTA 07/30/2017, 10:47 AM  [address]  [place], [place], [address] Phone: [phone]   Fax:  [phone]  Name: [name] MRN: [id number] Date of Birth: January 06, 1934

## 2017-07-30 NOTE — Telephone Encounter (Signed)
Britt Bolognese this med is going to require a PA is this something he can get otc that could be cheaper?

## 2017-07-31 NOTE — Telephone Encounter (Signed)
Isaac Laud is out of the office, covering his box. I will leave this in his box for his review. Generally Lovaza is preferred over generic forms of fish oil as they are widely unregulated, but Lovaza is prescription. Roquel Burgin PA-C

## 2017-08-03 NOTE — Telephone Encounter (Signed)
Waiting for prior auth from pharmacy

## 2017-08-04 NOTE — Telephone Encounter (Signed)
[place]. Let me know if I need to do the prior authorization.

## 2017-08-05 ENCOUNTER — Encounter: Payer: Self-pay | Admitting: Physical Therapy

## 2017-08-05 ENCOUNTER — Ambulatory Visit: Payer: Medicare Other | Admitting: Physical Therapy

## 2017-08-05 DIAGNOSIS — M25611 Stiffness of right shoulder, not elsewhere classified: Secondary | ICD-10-CM

## 2017-08-05 DIAGNOSIS — R262 Difficulty in walking, not elsewhere classified: Secondary | ICD-10-CM

## 2017-08-05 DIAGNOSIS — M25511 Pain in right shoulder: Secondary | ICD-10-CM | POA: Diagnosis not present

## 2017-08-05 DIAGNOSIS — M25562 Pain in left knee: Secondary | ICD-10-CM

## 2017-08-05 NOTE — Therapy (Signed)
[address] [place], [place], [address] Phone: [phone]   Fax:  [phone]  Physical Therapy Treatment  Patient Details  Name: [name] MRN: [id number] Date of Birth: Oct 07, 1933 Referring Provider: Janett Billow Copland   Encounter Date: 08/05/2017  PT End of Session - 08/05/17 1619    Visit Number  7    Date for PT Re-Evaluation  09/09/17    PT Start Time  1440    PT Stop Time  1530    PT Time Calculation (min)  50 min    Activity Tolerance  Patient tolerated treatment well    Behavior During Therapy  [facility] for tasks assessed/performed       Past Medical History:  Diagnosis Date  . Anginal pain ([place])   . Anxiety    " OCCASIONAL"  . Arthritis   . Asthma due to seasonal allergies    uses inhalers prn  . Carotid artery occlusion    Left  . Complication of anesthesia    " had tremors after prostate surgery  . Coronary artery disease   . Diverticulitis    recurrent  . Family history of anesthesia complication    MOTHER   . GERD (gastroesophageal reflux disease)   . H/O hiatal hernia   . Hearing aid worn   . Hyperlipemia   . Hypertension   . Kidney stone    lithotripsy 7408 w complications, req stents, Dr Rosana Hoes  . Prostate CA ([place])   . Shortness of breath   . Thyroid disease   . TIA (transient ischemic attack)   . Tinnitus     Past Surgical History:  Procedure Laterality Date  . CARDIAC CATHETERIZATION  2008   minimal dz, Dr Cathie Olden  . CARDIAC CATHETERIZATION N/A 10/09/2015   Procedure: Left Heart Cath and Coronary Angiography;  Surgeon: Peter M Martinique, MD;  Location: [place] [facility];  Service: Cardiovascular;  Laterality: N/A;  . CARDIAC CATHETERIZATION N/A 10/09/2015   Procedure: Intravascular Pressure Wire/FFR Study;  Surgeon: Peter M Martinique, MD;  Location: [facility];  Service: Cardiovascular;  Laterality: N/A;  . CARDIAC CATHETERIZATION N/A 10/09/2015   Procedure: Coronary  Stent Intervention;  Surgeon: Peter M Martinique, MD;  Location: [place] [facility];  Service: Cardiovascular;  Laterality: N/A;  . CAROTID ENDARTERECTOMY  Left   1998  . CHOLECYSTECTOMY    . CORONARY STENT PLACEMENT  10/09/2015   DES to RCA  . ESOPHAGOGASTRODUODENOSCOPY (EGD) WITH PROPOFOL Left 03/26/2017   Procedure: ESOPHAGOGASTRODUODENOSCOPY (EGD) WITH PROPOFOL;  Surgeon: Arta Silence, MD;  [facility];  Service: Endoscopy;  Laterality: Left;  . LEFT HEART CATHETERIZATION WITH CORONARY ANGIOGRAM N/A 09/15/2013   Procedure: LEFT HEART CATHETERIZATION WITH CORONARY ANGIOGRAM;  Surgeon: Peter M Martinique, MD;  Location: [facility] CATH LAB;  Service: Cardiovascular;  Laterality: N/A;  . PROSTATECTOMY  1998   radical for prostate cancer    There were no vitals filed for this visit.  Subjective Assessment - 08/05/17 1446    Subjective  Patient reports still hurting    Currently in Pain?  Yes    Pain Score  3     Pain Location  Shoulder    Pain Orientation  Right    Pain Descriptors / Indicators  Aching                      [facility] Adult PT Treatment/Exercise - 08/05/17 0001  Ambulation/Gait   Gait Comments  had him walk with me to the front door, then a short rest, then we walked around the front parking island, he needed two rests due to fatigue and short of breath      Knee/Hip Exercises: Aerobic   Nustep  L 5 6 min      Knee/Hip Exercises: Machines for Strengthening   Cybex Knee Extension  5# 3 sets 10    Cybex Knee Flexion  20# 3x10      Knee/Hip Exercises: Seated   Ball Squeeze  20    Clamshell with Marga Hoots               PT Short Term Goals - 07/23/17 1100      PT SHORT TERM GOAL #1   Title  Demonstrate proper execution of HEP.     Baseline  can demo but verb inconsistancy with compliance    Status  Achieved        PT Long Term Goals - 08/05/17 1704      PT LONG TERM GOAL #1   Title  Decrease pain with activity to 5/10 at  worst.     Status  Partially Met      PT LONG TERM GOAL #2   Title  Increase AROM of right shoulder flexion to125 degrees.     Status  Partially Met      PT LONG TERM GOAL #3   Title  Pt. will be able to put on his shirt with 50% less difficulty.     Status  Partially Met      PT LONG TERM GOAL #4   Title  Pt will be able to reach opposite shoulder for bathing and personal hygiene.    Status  Partially Met      PT LONG TERM GOAL #5   Title  Independent with gym transition    Status  On-going            Plan - 08/05/17 1620    Clinical Impression Statement  Patient c/o weak legs, I had him walk with me today, a total of 500 feet, he needed 3 rests due to fatigue and shortness of breath.  He feels like he needs to be more active and walk more.    PT Next Visit Plan  may try to have him walk some to build endurance    Consulted and Agree with Plan of Care  Patient       Patient will benefit from skilled therapeutic intervention in order to improve the following deficits and impairments:  Decreased activity tolerance, Abnormal gait, Decreased mobility, Decreased strength, Postural dysfunction, Improper body mechanics, Pain, Impaired UE functional use, Decreased range of motion, Difficulty walking  Visit Diagnosis: Acute pain of right shoulder  Stiffness of right shoulder, not elsewhere classified  Acute pain of left knee  Difficulty in walking, not elsewhere classified     Problem List Patient Active Problem List   Diagnosis Date Noted  . TIA (transient ischemic attack)   . Thyroid disease   . Shortness of breath   . Prostate CA ([place])   . Kidney stone   . Hypertension   . Hearing aid worn   . H/O hiatal hernia   . GERD (gastroesophageal reflux disease)   . Family history of anesthesia complication   . Diverticulitis   . Coronary artery disease   . Complication of anesthesia   . Asthma due to seasonal allergies   .  Arthritis   . Anxiety   . Anginal pain  ([place])   . Erosive gastropathy 03/26/2017  . Acute blood loss anemia 03/25/2017  . Melena 03/24/2017  . CKD (chronic kidney disease), stage III ([place]) 03/24/2017  . AF (paroxysmal atrial fibrillation) ([place]) 03/24/2017  . Angina pectoris ([place]) 10/09/2015  . Coronary artery disease due to lipid rich plaque   . Essential hypertension   . Hyperlipemia   . Carotid artery occlusion   . Dizziness 08/09/2015  . Gait instability 05/30/2015  . Myalgia   . Weakness   . Bloating   . Chest pain at rest 03/24/2015  . Atypical chest pain 03/24/2015  . Fatigue 03/14/2015  . Hypothyroidism, acquired 10/26/2013  . OSA (obstructive sleep apnea) 10/18/2013  . Well adult health check 01/02/2013  . Other symptoms involving urinary system(788.99) 12/21/2012  . Benign localized hyperplasia of prostate with urinary obstruction and other lower urinary tract symptoms (LUTS)(600.21) 12/21/2012  . Carcinoma in situ of prostate 12/21/2012  . Left shoulder pain 08/08/2012  . Obesity 07/24/2011  . Generalized anxiety disorder 07/24/2011  . DIVERTICULOSIS OF COLON 12/25/2009  . DERMATITIS, SEBORRHEIC 12/25/2009  . 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 Memory loss 08/14/2009  . BACK PAIN, LUMBAR 07/19/2009  . VENOUS INSUFFICIENCY, CHRONIC 09/09/2006  . GASTROESOPHAGEAL REFLUX, NO ESOPHAGITIS 09/09/2006  . HERNIA, HIATAL, NONCONGENITAL 09/09/2006  . INSOMNIA NOS 09/09/2006    Sumner Boast., PT 08/05/2017, 5:08 PM  [address] [place], [place], [address] Phone: [phone]   Fax:  [phone]  Name: [name] MRN: [id number] Date of Birth: July 30, 1933

## 2017-08-06 ENCOUNTER — Encounter: Payer: Self-pay | Admitting: Hematology & Oncology

## 2017-08-09 ENCOUNTER — Encounter: Payer: Self-pay | Admitting: Physical Therapy

## 2017-08-09 ENCOUNTER — Ambulatory Visit: Payer: Medicare Other | Admitting: Physical Therapy

## 2017-08-09 DIAGNOSIS — M25611 Stiffness of right shoulder, not elsewhere classified: Secondary | ICD-10-CM

## 2017-08-09 DIAGNOSIS — M25511 Pain in right shoulder: Secondary | ICD-10-CM

## 2017-08-09 DIAGNOSIS — M25562 Pain in left knee: Secondary | ICD-10-CM

## 2017-08-09 NOTE — Therapy (Signed)
[place] [place], [place], [address] Phone: [phone]   Fax:  [phone]  Physical Therapy Treatment  Patient Details  Name: [name] MRN: [id number] Date of Birth: 23-Jul-1933 Referring Provider: Janett Billow Copland   Encounter Date: 08/09/2017  PT End of Session - 08/09/17 1512    Visit Number  8    Date for PT Re-Evaluation  09/09/17    PT Start Time  1430    PT Stop Time  1512    PT Time Calculation (min)  42 min    Activity Tolerance  Patient tolerated treatment well    Behavior During Therapy  [facility] for tasks assessed/performed       Past Medical History:  Diagnosis Date  . Anginal pain ([place])   . Anxiety    " OCCASIONAL"  . Arthritis   . Asthma due to seasonal allergies    uses inhalers prn  . Carotid artery occlusion    Left  . Complication of anesthesia    " had tremors after prostate surgery  . Coronary artery disease   . Diverticulitis    recurrent  . Family history of anesthesia complication    MOTHER   . GERD (gastroesophageal reflux disease)   . H/O hiatal hernia   . Hearing aid worn   . Hyperlipemia   . Hypertension   . Kidney stone    lithotripsy 4332 w complications, req stents, Dr Rosana Hoes  . Prostate CA ([place])   . Shortness of breath   . Thyroid disease   . TIA (transient ischemic attack)   . Tinnitus     Past Surgical History:  Procedure Laterality Date  . CARDIAC CATHETERIZATION  2008   minimal dz, Dr Cathie Olden  . CARDIAC CATHETERIZATION N/A 10/09/2015   Procedure: Left Heart Cath and Coronary Angiography;  Surgeon: Peter M Martinique, MD;  Location: [place] [facility];  Service: Cardiovascular;  Laterality: N/A;  . CARDIAC CATHETERIZATION N/A 10/09/2015   Procedure: Intravascular Pressure Wire/FFR Study;  Surgeon: Peter M Martinique, MD;  Location: [place] [facility];  Service: Cardiovascular;  Laterality: N/A;  . CARDIAC CATHETERIZATION N/A 10/09/2015   Procedure: Coronary  Stent Intervention;  Surgeon: Peter M Martinique, MD;  Location: [place] [facility];  Service: Cardiovascular;  Laterality: N/A;  . CAROTID ENDARTERECTOMY  Left   1998  . CHOLECYSTECTOMY    . CORONARY STENT PLACEMENT  10/09/2015   DES to RCA  . ESOPHAGOGASTRODUODENOSCOPY (EGD) WITH PROPOFOL Left 03/26/2017   Procedure: ESOPHAGOGASTRODUODENOSCOPY (EGD) WITH PROPOFOL;  Surgeon: Arta Silence, MD;  [facility];  Service: Endoscopy;  Laterality: Left;  . LEFT HEART CATHETERIZATION WITH CORONARY ANGIOGRAM N/A 09/15/2013   Procedure: LEFT HEART CATHETERIZATION WITH CORONARY ANGIOGRAM;  Surgeon: Peter M Martinique, MD;  Location: [facility] CATH LAB;  Service: Cardiovascular;  Laterality: N/A;  . PROSTATECTOMY  1998   radical for prostate cancer    There were no vitals filed for this visit.  Subjective Assessment - 08/09/17 1435    Subjective  "Hurting today, My shoulder, I think I slept on it wrong, hurting all day"    Currently in Pain?  No/denies    Pain Score  0-No pain                      [facility] Adult PT Treatment/Exercise - 08/09/17 0001      Ambulation/Gait   Gait Comments  had him walk with me to  the front door, then we walked around the front parking island, he needed one rests due to fatigue and short of breath      Knee/Hip Exercises: Aerobic   Nustep  L 5 6 min      Knee/Hip Exercises: Machines for Strengthening   Cybex Knee Extension  5# 3 sets 10    Cybex Knee Flexion  20# 3x10      Knee/Hip Exercises: Standing   Other Standing Knee Exercises  23x10 each       Knee/Hip Exercises: Seated   Ball Squeeze  20    Clamshell with TheraBand  Green    Sit to [employer]  10 reps;without UE support      Shoulder Exercises: Standing   Extension  Strengthening;Both;15 reps;Theraband    Theraband Level (Shoulder Extension)  Level 2 (Red)               PT Short Term Goals - 07/23/17 1100      PT SHORT TERM GOAL #1   Title  Demonstrate proper execution of HEP.      Baseline  can demo but verb inconsistancy with compliance    Status  Achieved        PT Long Term Goals - 08/05/17 1704      PT LONG TERM GOAL #1   Title  Decrease pain with activity to 5/10 at worst.     Status  Partially Met      PT LONG TERM GOAL #2   Title  Increase AROM of right shoulder flexion to125 degrees.     Status  Partially Met      PT LONG TERM GOAL #3   Title  Pt. will be able to put on his shirt with 50% less difficulty.     Status  Partially Met      PT LONG TERM GOAL #4   Title  Pt will be able to reach opposite shoulder for bathing and personal hygiene.    Status  Partially Met      PT LONG TERM GOAL #5   Title  Independent with gym transition    Status  On-going            Plan - 08/09/17 1513    Clinical Impression Statement  Pt able to complete same gait trial as last visit but with less rest. c/o weakness and fatigue in legs. He reports that he has some chest pain when he walks at home but none today.    Rehab Potential  Good    PT Frequency  2x / week    PT Duration  8 weeks    PT Treatment/Interventions  ADLs/Self Care Home Management;Cryotherapy;Electrical Stimulation;Gait training;Moist Heat;Iontophoresis 15m/ml Dexamethasone;Stair training;Functional mobility training;Therapeutic activities;Therapeutic exercise;Balance training;Neuromuscular re-education;Manual techniques;Patient/family education    PT Next Visit Plan  may try to have him walk some to build endurance       Patient will benefit from skilled therapeutic intervention in order to improve the following deficits and impairments:  Decreased activity tolerance, Abnormal gait, Decreased mobility, Decreased strength, Postural dysfunction, Improper body mechanics, Pain, Impaired UE functional use, Decreased range of motion, Difficulty walking  Visit Diagnosis: Acute pain of right shoulder  Stiffness of right shoulder, not elsewhere classified  Acute pain of left  knee     Problem List Patient Active Problem List   Diagnosis Date Noted  . TIA (transient ischemic attack)   . Thyroid disease   . Shortness of breath   .  Prostate CA ([place])   . Kidney stone   . Hypertension   . Hearing aid worn   . H/O hiatal hernia   . GERD (gastroesophageal reflux disease)   . Family history of anesthesia complication   . Diverticulitis   . Coronary artery disease   . Complication of anesthesia   . Asthma due to seasonal allergies   . Arthritis   . Anxiety   . Anginal pain ([place])   . Erosive gastropathy 03/26/2017  . Acute blood loss anemia 03/25/2017  . Melena 03/24/2017  . CKD (chronic kidney disease), stage III ([place]) 03/24/2017  . AF (paroxysmal atrial fibrillation) ([place]) 03/24/2017  . Angina pectoris ([place]) 10/09/2015  . Coronary artery disease due to lipid rich plaque   . Essential hypertension   . Hyperlipemia   . Carotid artery occlusion   . Dizziness 08/09/2015  . Gait instability 05/30/2015  . Myalgia   . Weakness   . Bloating   . Chest pain at rest 03/24/2015  . Atypical chest pain 03/24/2015  . Fatigue 03/14/2015  . Hypothyroidism, acquired 10/26/2013  . OSA (obstructive sleep apnea) 10/18/2013  . Well adult health check 01/02/2013  . Other symptoms involving urinary system(788.99) 12/21/2012  . Benign localized hyperplasia of prostate with urinary obstruction and other lower urinary tract symptoms (LUTS)(600.21) 12/21/2012  . Carcinoma in situ of prostate 12/21/2012  . Left shoulder pain 08/08/2012  . Obesity 07/24/2011  . Generalized anxiety disorder 07/24/2011  . DIVERTICULOSIS OF COLON 12/25/2009  . DERMATITIS, SEBORRHEIC 12/25/2009  . 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 Memory loss 08/14/2009  . BACK PAIN, LUMBAR 07/19/2009  . VENOUS INSUFFICIENCY, CHRONIC 09/09/2006  . GASTROESOPHAGEAL REFLUX, NO ESOPHAGITIS 09/09/2006  . HERNIA, HIATAL, NONCONGENITAL 09/09/2006  . INSOMNIA NOS 09/09/2006    Scot Jun,  PTA 08/09/2017, 3:15 PM  [place] [address] [place], [place], [address] Phone: [phone]   Fax:  [phone]  Name: [name] MRN: [id number] Date of Birth: [birth year]

## 2017-08-12 ENCOUNTER — Ambulatory Visit: Payer: Medicare Other | Admitting: Physical Therapy

## 2017-08-12 DIAGNOSIS — M25562 Pain in left knee: Secondary | ICD-10-CM

## 2017-08-12 DIAGNOSIS — M25511 Pain in right shoulder: Secondary | ICD-10-CM

## 2017-08-12 DIAGNOSIS — M25611 Stiffness of right shoulder, not elsewhere classified: Secondary | ICD-10-CM

## 2017-08-12 DIAGNOSIS — R262 Difficulty in walking, not elsewhere classified: Secondary | ICD-10-CM

## 2017-08-12 NOTE — Telephone Encounter (Signed)
Hao the [place] medication has been denied by the insurance. Is there any alternatives?

## 2017-08-12 NOTE — Therapy (Signed)
[place] [address] [place], [place], [address] Phone: [phone]   Fax:  [phone]  Physical Therapy Treatment  Patient Details  Name: [name] MRN: [id number] Date of Birth: April 17, 1934 Referring Provider: Janett Billow Copland   Encounter Date: 08/12/2017  PT End of Session - 08/12/17 1514    Visit Number  9    Date for PT Re-Evaluation  09/09/17    PT Start Time  1443    PT Stop Time  1530    PT Time Calculation (min)  47 min       Past Medical History:  Diagnosis Date  . Anginal pain ([place])   . Anxiety    " OCCASIONAL"  . Arthritis   . Asthma due to seasonal allergies    uses inhalers prn  . Carotid artery occlusion    Left  . Complication of anesthesia    " had tremors after prostate surgery  . Coronary artery disease   . Diverticulitis    recurrent  . Family history of anesthesia complication    MOTHER   . GERD (gastroesophageal reflux disease)   . H/O hiatal hernia   . Hearing aid worn   . Hyperlipemia   . Hypertension   . Kidney stone    lithotripsy 4327 w complications, req stents, Dr Rosana Hoes  . Prostate CA ([place])   . Shortness of breath   . Thyroid disease   . TIA (transient ischemic attack)   . Tinnitus     Past Surgical History:  Procedure Laterality Date  . CARDIAC CATHETERIZATION  2008   minimal dz, Dr Cathie Olden  . CARDIAC CATHETERIZATION N/A 10/09/2015   Procedure: Left Heart Cath and Coronary Angiography;  Surgeon: Peter M Martinique, MD;  Location: [place] [facility];  Service: Cardiovascular;  Laterality: N/A;  . CARDIAC CATHETERIZATION N/A 10/09/2015   Procedure: Intravascular Pressure Wire/FFR Study;  Surgeon: Peter M Martinique, MD;  Location: [place] [facility];  Service: Cardiovascular;  Laterality: N/A;  . CARDIAC CATHETERIZATION N/A 10/09/2015   Procedure: Coronary Stent Intervention;  Surgeon: Peter M Martinique, MD;  Location: [place] [facility];  Service: Cardiovascular;   Laterality: N/A;  . CAROTID ENDARTERECTOMY  Left   1998  . CHOLECYSTECTOMY    . CORONARY STENT PLACEMENT  10/09/2015   DES to RCA  . ESOPHAGOGASTRODUODENOSCOPY (EGD) WITH PROPOFOL Left 03/26/2017   Procedure: ESOPHAGOGASTRODUODENOSCOPY (EGD) WITH PROPOFOL;  Surgeon: Arta Silence, MD;  [facility];  Service: Endoscopy;  Laterality: Left;  . LEFT HEART CATHETERIZATION WITH CORONARY ANGIOGRAM N/A 09/15/2013   Procedure: LEFT HEART CATHETERIZATION WITH CORONARY ANGIOGRAM;  Surgeon: Peter M Martinique, MD;  Location: [facility] C[facility];  Service: Cardiovascular;  Laterality: N/A;  . PROSTATECTOMY  1998   radical for prostate cancer    There were no vitals filed for this visit.  Subjective Assessment - 08/12/17 1449    Subjective  in response to how are you today " I dont know -same" " I am in [place] today"    Currently in Pain?  No/denies                      [facility] Adult PT Treatment/Exercise - 08/12/17 0001      Knee/Hip Exercises: Aerobic   Nustep  L 5 6 min      Knee/Hip Exercises: Standing   Other Standing Knee Exercises  hip flex,ext,abd,marching 5# 10 reps  Knee/Hip Exercises: Seated   Long Arc Quad  Strengthening;Both;2 sets;10 reps;Weights 5#    Other Seated Knee/Hip Exercises  5# hip flex and abd  2 sets 10    Sit to Sand  3 sets;5 reps;without UE support wt ball UE ex      Shoulder Exercises: ROM/Strengthening   UBE (Upper Arm Bike)  L 4 3 fwd/3back    Other ROM/Strengthening Exercises  lat pull and  row 20# 2 sets 10               PT Short Term Goals - 07/23/17 1100      PT SHORT TERM GOAL #1   Title  Demonstrate proper execution of HEP.     Baseline  can demo but verb inconsistancy with compliance    Status  Achieved        PT Long Term Goals - 08/12/17 1452      PT LONG TERM GOAL #1   Title  Decrease pain with activity to 5/10 at worst.     Status  Partially Met      PT LONG TERM GOAL #2   Title  Increase AROM of right  shoulder flexion to125 degrees.     Baseline  137 but  pain with eccentric lowering    Status  Partially Met      PT LONG TERM GOAL #3   Title  Pt. will be able to put on his shirt with 50% less difficulty.     Status  Achieved      PT LONG TERM GOAL #4   Title  Pt will be able to reach opposite shoulder for bathing and personal hygiene.    Status  Partially Met      PT LONG TERM GOAL #5   Title  Independent with gym transition    Status  On-going            Plan - 08/12/17 1515    Clinical Impression Statement  pt needs cuing with all ex for correct executiona nd to stay on task. pt very non specific with symptoms and verb very sedintary. slow progression with goals    PT Treatment/Interventions  ADLs/Self Care Home Management;Cryotherapy;Electrical Stimulation;Gait training;Moist Heat;Iontophoresis 70m/ml Dexamethasone;Stair training;Functional mobility training;Therapeutic activities;Therapeutic exercise;Balance training;Neuromuscular re-education;Manual techniques;Patient/family education    PT Next Visit Plan  strength/func/endurance       Patient will benefit from skilled therapeutic intervention in order to improve the following deficits and impairments:  Decreased activity tolerance, Abnormal gait, Decreased mobility, Decreased strength, Postural dysfunction, Improper body mechanics, Pain, Impaired UE functional use, Decreased range of motion, Difficulty walking  Visit Diagnosis: Acute pain of right shoulder  Stiffness of right shoulder, not elsewhere classified  Acute pain of left knee  Difficulty in walking, not elsewhere classified     Problem List Patient Active Problem List   Diagnosis Date Noted  . TIA (transient ischemic attack)   . Thyroid disease   . Shortness of breath   . [place]state CA ([place]   . Kidney stone   . Hypertension   . Hearing aid worn   . H/O hiatal hernia   . GERD (gastroesophageal reflux disease)   . Family history of anesthesia  complication   . Diverticulitis   . Coronary artery disease   . Complication of anesthesia   . Asthma due to seasonal allergies   . Arthritis   . Anxiety   . Anginal pain ([place]   . Erosive gastropathy 03/26/2017  .  Acute blood loss anemia 03/25/2017  . Melena 03/24/2017  . CKD (chronic kidney disease), stage III ([place]) 03/24/2017  . AF (paroxysmal atrial fibrillation) ([place]) 03/24/2017  . Angina pectoris ([place]) 10/09/2015  . Coronary artery disease due to lipid rich plaque   . Essential hypertension   . Hyperlipemia   . Carotid artery occlusion   . Dizziness 08/09/2015  . Gait instability 05/30/2015  . Myalgia   . Weakness   . Bloating   . Chest pain at rest 03/24/2015  . Atypical chest pain 03/24/2015  . Fatigue 03/14/2015  . Hypothyroidism, acquired 10/26/2013  . OSA (obstructive sleep apnea) 10/18/2013  . Well adult health check 01/02/2013  . Other symptoms involving urinary system(788.99) 12/21/2012  . Benign localized hyperplasia of prostate with urinary obstruction and other lower urinary tract symptoms (LUTS)(600.21) 12/21/2012  . Carcinoma in situ of prostate 12/21/2012  . Left shoulder pain 08/08/2012  . Obesity 07/24/2011  . Generalized anxiety disorder 07/24/2011  . DIVERTICULOSIS OF COLON 12/25/2009  . DERMATITIS, SEBORRHEIC 12/25/2009  . 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 Memory loss 08/14/2009  . BACK PAIN, LUMBAR 07/19/2009  . VENOUS INSUFFICIENCY, CHRONIC 09/09/2006  . GASTROESOPHAGEAL REFLUX, NO ESOPHAGITIS 09/09/2006  . HERNIA, HIATAL, NONCONGENITAL 09/09/2006  . INSOMNIA NOS 09/09/2006    Jerek Meulemans,ANGIE PTA 08/12/2017, 3:17 PM  [place] [address] [place], [place], [address] Phone: [phone]   Fax:  [phone]  Name: [name] MRN: [id number] Date of Birth: [birth year]

## 2017-08-16 NOTE — Telephone Encounter (Signed)
Regular over the counter fish oil for now, depend on the brand, if he gets the [employer] or the [employer] version, he will need to take at least 1200mg  daily. So if the capsule come in 1200mg , then take once a day. If the capsule come in 1000mg , then need to take twice a day.

## 2017-08-18 ENCOUNTER — Ambulatory Visit: Payer: Medicare Other | Attending: Family Medicine | Admitting: Physical Therapy

## 2017-08-18 ENCOUNTER — Encounter: Payer: Self-pay | Admitting: Medical

## 2017-08-18 ENCOUNTER — Encounter: Payer: Self-pay | Admitting: Physical Therapy

## 2017-08-18 DIAGNOSIS — R262 Difficulty in walking, not elsewhere classified: Secondary | ICD-10-CM | POA: Insufficient documentation

## 2017-08-18 DIAGNOSIS — M25511 Pain in right shoulder: Secondary | ICD-10-CM

## 2017-08-18 DIAGNOSIS — M25562 Pain in left knee: Secondary | ICD-10-CM | POA: Insufficient documentation

## 2017-08-18 DIAGNOSIS — M25611 Stiffness of right shoulder, not elsewhere classified: Secondary | ICD-10-CM | POA: Diagnosis present

## 2017-08-18 MED ORDER — ALBUTEROL SULFATE HFA 108 (90 BASE) MCG/ACT IN AERS: 2.0000 | INHALATION_SPRAY | Freq: Four times a day (QID) | RESPIRATORY_TRACT | 5 refills | Status: DC | PRN

## 2017-08-18 NOTE — Therapy (Signed)
[address] [place], [place], [address] Phone: [phone]   Fax:  [phone]  Physical Therapy Treatment  Patient Details  Name: [name] MRN: [id number] Date of Birth: 1934-03-26 Referring Provider: Janett Billow Copland   Encounter Date: 08/18/2017  PT End of Session - 08/18/17 1643    Visit Number  10    Date for PT Re-Evaluation  09/09/17    PT Start Time  1600    PT Stop Time  6195    PT Time Calculation (min)  43 min       Past Medical History:  Diagnosis Date  . Anginal pain ([place])   . Anxiety    " OCCASIONAL"  . Arthritis   . Asthma due to seasonal allergies    uses inhalers prn  . Carotid artery occlusion    Left  . Complication of anesthesia    " had tremors after prostate surgery  . Coronary artery disease   . Diverticulitis    recurrent  . Family history of anesthesia complication    MOTHER   . GERD (gastroesophageal reflux disease)   . H/O hiatal hernia   . Hearing aid worn   . Hyperlipemia   . Hypertension   . Kidney stone    lithotripsy 0932 w complications, req stents, Dr Rosana Hoes  . Prostate CA ([place])   . Shortness of breath   . Thyroid disease   . TIA (transient ischemic attack)   . Tinnitus     Past Surgical History:  Procedure Laterality Date  . CARDIAC CATHETERIZATION  2008   minimal dz, Dr Cathie Olden  . CARDIAC CATHETERIZATION N/A 10/09/2015   Procedure: Left Heart Cath and Coronary Angiography;  Surgeon: Peter M Martinique, MD;  Location: [place] [facility];  Service: Cardiovascular;  Laterality: N/A;  . CARDIAC CATHETERIZATION N/A 10/09/2015   Procedure: Intravascular Pressure Wire/FFR Study;  Surgeon: Peter M Martinique, MD;  Location: [place] [facility];  Service: Cardiovascular;  Laterality: N/A;  . CARDIAC CATHETERIZATION N/A 10/09/2015   Procedure: Coronary Stent Intervention;  Surgeon: Peter M Martinique, MD;  Location: [place] [facility];  Service: Cardiovascular;   Laterality: N/A;  . CAROTID ENDARTERECTOMY  Left   1998  . CHOLECYSTECTOMY    . CORONARY STENT PLACEMENT  10/09/2015   DES to RCA  . ESOPHAGOGASTRODUODENOSCOPY (EGD) WITH PROPOFOL Left 03/26/2017   Procedure: ESOPHAGOGASTRODUODENOSCOPY (EGD) WITH PROPOFOL;  Surgeon: Arta Silence, MD;  [facility];  Service: Endoscopy;  Laterality: Left;  . LEFT HEART CATHETERIZATION WITH CORONARY ANGIOGRAM N/A 09/15/2013   Procedure: LEFT HEART CATHETERIZATION WITH CORONARY ANGIOGRAM;  Surgeon: Peter M Martinique, MD;  Location: [facility] C[facility] LAB;  Service: Cardiovascular;  Laterality: N/A;  . PROSTATECTOMY  1998   radical for prostate cancer    There were no vitals filed for this visit.  Subjective Assessment - 08/18/17 1604    Subjective  "Im ok, same ols, same old, sleeping all day"    Currently in Pain?  No/denies    Pain Score  0-No pain         [facility] PT Assessment - 08/18/17 0001      PROM   Right/Left Shoulder  Right    Right Shoulder Flexion  130 Degrees                  [facility] Adult PT Treatment/Exercise - 08/18/17 0001      Ambulation/Gait   Gait Comments  had  him walk with me to the front door, then we walked around the front parking island, fatigue and short of breath      High Level Balance   High Level Balance Activities  Side stepping      Knee/Hip Exercises: Aerobic   Nustep  L 5 6 min      Knee/Hip Exercises: Machines for Strengthening   Cybex Knee Extension  5# x10 Pt reportes a stabbilng pain in lateral L thigh    Cybex Knee Flexion  25# 3x10      Knee/Hip Exercises: Seated   Long Arc Quad  Strengthening;Both;2 sets;10 reps;Weights    Long Arc Quad Weight  5 lbs.    Sit to Sand  2 sets;10 reps 1st set holding yellow ball                PT Short Term Goals - 07/23/17 1100      PT SHORT TERM GOAL #1   Title  Demonstrate proper execution of HEP.     Baseline  can demo but verb inconsistancy with compliance    Status  Achieved        PT  Long Term Goals - 08/18/17 1623      PT LONG TERM GOAL #1   Title  Decrease pain with activity to 5/10 at worst.     Status  Achieved      PT LONG TERM GOAL #2   Title  Increase AROM of right shoulder flexion to125 degrees.     Status  Achieved            Plan - 08/18/17 1646    Clinical Impression Statement  Decrease rest required for today's gait trial outside. Pt does becomes very fatigue with activity. Reports sharp lateral thigh pain with seated leg extensions. R shoulder AROM flexion goal met.     Rehab Potential  Good    PT Frequency  2x / week    PT Treatment/Interventions  ADLs/Self Care Home Management;Cryotherapy;Electrical Stimulation;Gait training;Moist Heat;Iontophoresis 56m/ml Dexamethasone;Stair training;Functional mobility training;Therapeutic activities;Therapeutic exercise;Balance training;Neuromuscular re-education;Manual techniques;Patient/family education    PT Next Visit Plan  strength/func/endurance       Patient will benefit from skilled therapeutic intervention in order to improve the following deficits and impairments:  Decreased activity tolerance, Abnormal gait, Decreased mobility, Decreased strength, Postural dysfunction, Improper body mechanics, Pain, Impaired UE functional use, Decreased range of motion, Difficulty walking  Visit Diagnosis: Acute pain of right shoulder  Stiffness of right shoulder, not elsewhere classified  Acute pain of left knee  Difficulty in walking, not elsewhere classified     Problem List Patient Active Problem List   Diagnosis Date Noted  . TIA (transient ischemic attack)   . Thyroid disease   . Shortness of breath   . Prostate CA ([place]   . Kidney stone   . Hypertension   . Hearing aid worn   . H/O hiatal hernia   . GERD (gastroesophageal reflux disease)   . Family history of anesthesia complication   . Diverticulitis   . Coronary artery disease   . Complication of anesthesia   . Asthma due to seasonal  allergies   . Arthritis   . Anxiety   . Anginal pain ([place]   . Erosive gastropathy 03/26/2017  . Acute blood loss anemia 03/25/2017  . Melena 03/24/2017  . CKD (chronic kidney disease), stage III ([place] 03/24/2017  . AF (paroxysmal atrial fibrillation) ([place] 03/24/2017  . Angina pectoris ([place] 10/09/2015  .  Coronary artery disease due to lipid rich plaque   . Essential hypertension   . Hyperlipemia   . Carotid artery occlusion   . Dizziness 08/09/2015  . Gait instability 05/30/2015  . Myalgia   . Weakness   . Bloating   . Chest pain at rest 03/24/2015  . Atypical chest pain 03/24/2015  . Fatigue 03/14/2015  . Hypothyroidism, acquired 10/26/2013  . OSA (obstructive sleep apnea) 10/18/2013  . Well adult health check 01/02/2013  . Other symptoms involving urinary system(788.99) 12/21/2012  . Benign localized hyperplasia of prostate with urinary obstruction and other lower urinary tract symptoms (LUTS)(600.21) 12/21/2012  . Carcinoma in situ of prostate 12/21/2012  . Left shoulder pain 08/08/2012  . Obesity 07/24/2011  . Generalized anxiety disorder 07/24/2011  . DIVERTICULOSIS OF COLON 12/25/2009  . DERMATITIS, SEBORRHEIC 12/25/2009  . 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 Memory loss 08/14/2009  . BACK PAIN, LUMBAR 07/19/2009  . VENOUS INSUFFICIENCY, CHRONIC 09/09/2006  . GASTROESOPHAGEAL REFLUX, NO ESOPHAGITIS 09/09/2006  . HERNIA, HIATAL, NONCONGENITAL 09/09/2006  . INSOMNIA NOS 09/09/2006    Scot Jun, PTA 08/18/2017, 4:50 PM  [place] [address] [place], [place], [address] Phone: [phone]   Fax:  [phone]  Name: [name] MRN: [id number] Date of Birth: 1933-08-16

## 2017-08-20 MED ORDER — FISH OIL 1200 MG PO CAPS: 1200.0000 mg | ORAL_CAPSULE | Freq: Every day | ORAL | 0 refills | Status: DC

## 2017-08-20 NOTE — Telephone Encounter (Signed)
Called patient leftt detailed message on patients voicemail informing him tio start otc fish oil.

## 2017-08-23 ENCOUNTER — Encounter: Payer: Self-pay | Admitting: Physical Therapy

## 2017-08-23 ENCOUNTER — Ambulatory Visit: Payer: Medicare Other | Admitting: Physical Therapy

## 2017-08-23 DIAGNOSIS — R262 Difficulty in walking, not elsewhere classified: Secondary | ICD-10-CM

## 2017-08-23 DIAGNOSIS — M25562 Pain in left knee: Secondary | ICD-10-CM

## 2017-08-23 DIAGNOSIS — M25511 Pain in right shoulder: Secondary | ICD-10-CM | POA: Diagnosis not present

## 2017-08-23 DIAGNOSIS — M25611 Stiffness of right shoulder, not elsewhere classified: Secondary | ICD-10-CM

## 2017-08-23 NOTE — Therapy (Signed)
[address] [place], [place], [address] Phone: [phone]   Fax:  [phone]  Physical Therapy Treatment  Patient Details  Name: [name] MRN: [id number] Date of Birth: 1934/05/03 Referring Provider: Janett Billow Copland   Encounter Date: 08/23/2017  PT End of Session - 08/23/17 1620    Visit Number  11    Date for PT Re-Evaluation  09/09/17    PT Start Time  1400    PT Stop Time  1449    PT Time Calculation (min)  49 min    Activity Tolerance  Patient tolerated treatment well    Behavior During Therapy  [facility] for tasks assessed/performed       Past Medical History:  Diagnosis Date  . Anginal pain ([place])   . Anxiety    " OCCASIONAL"  . Arthritis   . Asthma due to seasonal allergies    uses inhalers prn  . Carotid artery occlusion    Left  . Complication of anesthesia    " had tremors after prostate surgery  . Coronary artery disease   . Diverticulitis    recurrent  . Family history of anesthesia complication    MOTHER   . GERD (gastroesophageal reflux disease)   . H/O hiatal hernia   . Hearing aid worn   . Hyperlipemia   . Hypertension   . Kidney stone    lithotripsy 5732 w complications, req stents, Dr Rosana Hoes  . Prostate CA ([place])   . Shortness of breath   . Thyroid disease   . TIA (transient ischemic attack)   . Tinnitus     Past Surgical History:  Procedure Laterality Date  . CARDIAC CATHETERIZATION  2008   minimal dz, Dr Cathie Olden  . CARDIAC CATHETERIZATION N/A 10/09/2015   Procedure: Left Heart Cath and Coronary Angiography;  Surgeon: Peter M Martinique, MD;  Location: [place] [facility];  Service: Cardiovascular;  Laterality: N/A;  . CARDIAC CATHETERIZATION N/A 10/09/2015   Procedure: Intravascular Pressure Wire/FFR Study;  Surgeon: Peter M Martinique, MD;  Location: [place] [facility];  Service: Cardiovascular;  Laterality: N/A;  . CARDIAC CATHETERIZATION N/A 10/09/2015   Procedure:  Coronary Stent Intervention;  Surgeon: Peter M Martinique, MD;  Location: [place];  Service: Cardiovascular;  Laterality: N/A;  . CAROTID ENDARTERECTOMY  Left   1998  . CHOLECYSTECTOMY    . CORONARY STENT PLACEMENT  10/09/2015   DES to RCA  . ESOPHAGOGASTRODUODENOSCOPY (EGD) WITH PROPOFOL Left 03/26/2017   Procedure: ESOPHAGOGASTRODUODENOSCOPY (EGD) WITH PROPOFOL;  Surgeon: Arta Silence, MD;  [facility]: [facility];  Service: Endoscopy;  Laterality: Left;  . LEFT HEART CATHETERIZATION WITH CORONARY ANGIOGRAM N/A 09/15/2013   Procedure: LEFT HEART CATHETERIZATION WITH CORONARY ANGIOGRAM;  Surgeon: Peter M Martinique, MD;  Location: [facility] CATH LAB;  Service: Cardiovascular;  Laterality: N/A;  . PROSTATECTOMY  1998   radical for prostate cancer    There were no vitals filed for this visit.  Subjective Assessment - 08/23/17 1406    Subjective  Patient reports that he feels like he has had an "arthritis flare up" over the past few days.      Currently in Pain?  Yes    Pain Score  4     Pain Location  Shoulder    Aggravating Factors   "maybe weather", stairs, reaching up overhead    Pain Relieving Factors  rest helps some  [facility] Adult PT Treatment/Exercise - 08/23/17 0001      High Level Balance   High Level Balance Activities  Side stepping;Backward walking;Marching forwards      Knee/Hip Exercises: Aerobic   Nustep  L 5 6 min      Knee/Hip Exercises: Machines for Strengthening   Other Machine  15# lats, 15# seated rows, standing 5# straight arm pulls to activate core, 5# AR press for core      Knee/Hip Exercises: Seated   Long Arc Quad  Strengthening;Both;2 sets;10 reps;Weights    Long Arc Quad Weight  5 lbs.      Shoulder Exercises: Standing   External Rotation  Strengthening;Right;Left;20 reps;Theraband    Theraband Level (Shoulder External Rotation)  Level 2 (Red)    Other Standing Exercises  10# biceps, 25# triceps 3x10      Manual  Therapy   Manual Therapy  Passive ROM    Passive ROM  all motions of the right shoulder to the end range of his pain, also did wrist and forearm stretches as he could not supinate his wrists very well to try the bicep curls               PT Short Term Goals - 07/23/17 1100      PT SHORT TERM GOAL #1   Title  Demonstrate proper execution of HEP.     Baseline  can demo but verb inconsistancy with compliance    Status  Achieved        PT Long Term Goals - 08/18/17 1623      PT LONG TERM GOAL #1   Title  Decrease pain with activity to 5/10 at worst.     Status  Achieved      PT LONG TERM GOAL #2   Title  Increase AROM of right shoulder flexion to125 degrees.     Status  Achieved            Plan - 08/23/17 1621    Clinical Impression Statement  Patient with very tight forearms, stretched these as they were very tight and sore and he had a difficult time supinating to grab the bar for biceps curls.  He does need a lot of verbal and tactile cues for proper fornm    PT Next Visit Plan  continue to work on maximizing function and limiting pain    Consulted and Agree with Plan of Care  Patient       Patient will benefit from skilled therapeutic intervention in order to improve the following deficits and impairments:  Decreased activity tolerance, Abnormal gait, Decreased mobility, Decreased strength, Postural dysfunction, Improper body mechanics, Pain, Impaired UE functional use, Decreased range of motion, Difficulty walking  Visit Diagnosis: Acute pain of right shoulder  Stiffness of right shoulder, not elsewhere classified  Acute pain of left knee  Difficulty in walking, not elsewhere classified     Problem List Patient Active Problem List   Diagnosis Date Noted  . TIA (transient ischemic attack)   . Thyroid disease   . Shortness of breath   . Prostate CA ([place])   . Kidney stone   . Hypertension   . Hearing aid worn   . H/O hiatal hernia   . GERD  (gastroesophageal reflux disease)   . Family history of anesthesia complication   . Diverticulitis   . Coronary artery disease   . Complication of anesthesia   . Asthma due to seasonal allergies   . Arthritis   .  Anxiety   . Anginal pain ([place])   . Erosive gastropathy 03/26/2017  . Acute blood loss anemia 03/25/2017  . Melena 03/24/2017  . CKD (chronic kidney disease), stage III ([place]) 03/24/2017  . AF (paroxysmal atrial fibrillation) ([place]) 03/24/2017  . Angina pectoris ([place]) 10/09/2015  . Coronary artery disease due to lipid rich plaque   . Essential hypertension   . Hyperlipemia   . Carotid artery occlusion   . Dizziness 08/09/2015  . Gait instability 05/30/2015  . Myalgia   . Weakness   . Bloating   . Chest pain at rest 03/24/2015  . Atypical chest pain 03/24/2015  . Fatigue 03/14/2015  . Hypothyroidism, acquired 10/26/2013  . OSA (obstructive sleep apnea) 10/18/2013  . Well adult health check 01/02/2013  . Other symptoms involving urinary system(788.99) 12/21/2012  . Benign localized hyperplasia of prostate with urinary obstruction and other lower urinary tract symptoms (LUTS)(600.21) 12/21/2012  . Carcinoma in situ of prostate 12/21/2012  . Left shoulder pain 08/08/2012  . Obesity 07/24/2011  . Generalized anxiety disorder 07/24/2011  . DIVERTICULOSIS OF COLON 12/25/2009  . DERMATITIS, SEBORRHEIC 12/25/2009  . 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 Memory loss 08/14/2009  . BACK PAIN, LUMBAR 07/19/2009  . VENOUS INSUFFICIENCY, CHRONIC 09/09/2006  . GASTROESOPHAGEAL REFLUX, NO ESOPHAGITIS 09/09/2006  . HERNIA, HIATAL, NONCONGENITAL 09/09/2006  . INSOMNIA NOS 09/09/2006    Sumner Boast., PT 08/23/2017, 4:23 PM  [place] [place], [place], [address] Phone: [phone]   Fax:  [phone]  Name: [name] MRN: [id number] Date of Birth: 02-26-1934

## 2017-08-25 ENCOUNTER — Encounter: Payer: Self-pay | Admitting: Physical Therapy

## 2017-08-25 ENCOUNTER — Ambulatory Visit: Payer: Medicare Other | Admitting: Physical Therapy

## 2017-08-25 DIAGNOSIS — M25562 Pain in left knee: Secondary | ICD-10-CM

## 2017-08-25 DIAGNOSIS — R262 Difficulty in walking, not elsewhere classified: Secondary | ICD-10-CM

## 2017-08-25 DIAGNOSIS — M25511 Pain in right shoulder: Secondary | ICD-10-CM

## 2017-08-25 DIAGNOSIS — M25611 Stiffness of right shoulder, not elsewhere classified: Secondary | ICD-10-CM

## 2017-08-25 NOTE — Therapy (Addendum)
[place] [address] [place], [place], [address] Phone: [phone]   Fax:  [phone]  Physical Therapy Treatment  Patient Details  Name: [name] MRN: [id number] Date of Birth: 1934/06/26 Referring Provider: Janett Billow Copland   Encounter Date: 09/01/2017  PT End of Session - September 01, 2017 1508    Visit Number  12    Date for PT Re-Evaluation  09/09/17    PT Start Time  1430    PT Stop Time  1511    PT Time Calculation (min)  41 min    Activity Tolerance  Patient tolerated treatment well    Behavior During Therapy  [facility] for tasks assessed/performed       Past Medical History:  Diagnosis Date  . Anginal pain ([place])   . Anxiety    " OCCASIONAL"  . Arthritis   . Asthma due to seasonal allergies    uses inhalers prn  . Carotid artery occlusion    Left  . Complication of anesthesia    " had tremors after prostate surgery  . Coronary artery disease   . Diverticulitis    recurrent  . Family history of anesthesia complication    MOTHER   . GERD (gastroesophageal reflux disease)   . H/O hiatal hernia   . Hearing aid worn   . Hyperlipemia   . Hypertension   . Kidney stone    lithotripsy 8110 w complications, req stents, Dr Rosana Hoes  . Prostate CA ([place])   . Shortness of breath   . Thyroid disease   . TIA (transient ischemic attack)   . Tinnitus     Past Surgical History:  Procedure Laterality Date  . CARDIAC CATHETERIZATION  2008   minimal dz, Dr Cathie Olden  . CARDIAC CATHETERIZATION N/A 10/09/2015   Procedure: Left Heart Cath and Coronary Angiography;  Surgeon: Peter M Martinique, MD;  Location: [place] [facility];  Service: Cardiovascular;  Laterality: N/A;  . CARDIAC CATHETERIZATION N/A 10/09/2015   Procedure: Intravascular Pressure Wire/FFR Study;  Surgeon: Peter M Martinique, MD;  Location: [place] [facility];  Service: Cardiovascular;  Laterality: N/A;  . CARDIAC CATHETERIZATION N/A 10/09/2015   Procedure:  Coronary Stent Intervention;  Surgeon: Peter M Martinique, MD;  Location: [facility];  Service: Cardiovascular;  Laterality: N/A;  . CAROTID ENDARTERECTOMY  Left   1998  . CHOLECYSTECTOMY    . CORONARY STENT PLACEMENT  10/09/2015   DES to RCA  . ESOPHAGOGASTRODUODENOSCOPY (EGD) WITH PROPOFOL Left 03/26/2017   Procedure: ESOPHAGOGASTRODUODENOSCOPY (EGD) WITH PROPOFOL;  Surgeon: Arta Silence, MD;  [facility];  Service: Endoscopy;  Laterality: Left;  . LEFT HEART CATHETERIZATION WITH CORONARY ANGIOGRAM N/A 09/15/2013   Procedure: LEFT HEART CATHETERIZATION WITH CORONARY ANGIOGRAM;  Surgeon: Peter M Martinique, MD;  Location: [facility] C[facility] LAB;  Service: Cardiovascular;  Laterality: N/A;  . PROSTATECTOMY  1998   radical for prostate cancer    There were no vitals filed for this visit.  Subjective Assessment - September 01, 2017 1441    Subjective  "dead tired today"    Currently in Pain?  No/denies    Pain Score  0-No pain                      [facility] Adult PT Treatment/Exercise - Sep 01, 2017 0001      Knee/Hip Exercises: Aerobic   Nustep  L 5 8 min      Knee/Hip Exercises: Machines for Strengthening  Other Machine  15# lats, 15# seated rows, standing 5# straight arm pulls to activate core, 5# AR press for core      Knee/Hip Exercises: Seated   Sit to Sand  2 sets;10 reps      Shoulder Exercises: Standing   Other Standing Exercises  10# biceps, 25# triceps 3x10      Manual Therapy   Manual Therapy  Passive ROM    Passive ROM  all motions of the right shoulder to the end range of his pain, also did wrist and forearm stretches as he could not supinate his wrists very well to try the bicep curls               PT Short Term Goals - 07/23/17 1100      PT SHORT TERM GOAL #1   Title  Demonstrate proper execution of HEP.     Baseline  can demo but verb inconsistancy with compliance    Status  Achieved        PT Long Term Goals - 08/18/17 1623      PT LONG TERM  GOAL #1   Title  Decrease pain with activity to 5/10 at worst.     Status  Achieved      PT LONG TERM GOAL #2   Title  Increase AROM of right shoulder flexion to125 degrees.     Status  Achieved            Plan - 08/25/17 1508    Clinical Impression Statement  Pt enters clinic reporting increase fatigue. Pt with better tolerance of bicep curls and triceps extensions. Some pain noted with R shoulder PROM at end range. Verbal and tactile cues needed with standing straight arm pull downs and AR presses.    Rehab Potential  Good    PT Frequency  2x / week    PT Duration  8 weeks    PT Treatment/Interventions  ADLs/Self Care Home Management;Cryotherapy;Electrical Stimulation;Gait training;Moist Heat;Iontophoresis 32m/ml Dexamethasone;Stair training;Functional mobility training;Therapeutic activities;Therapeutic exercise;Balance training;Neuromuscular re-education;Manual techniques;Patient/family education    PT Next Visit Plan  continue to work on maximizing function and limiting pain       Patient will benefit from skilled therapeutic intervention in order to improve the following deficits and impairments:  Decreased activity tolerance, Abnormal gait, Decreased mobility, Decreased strength, Postural dysfunction, Improper body mechanics, Pain, Impaired UE functional use, Decreased range of motion, Difficulty walking  Visit Diagnosis: Acute pain of right shoulder  Stiffness of right shoulder, not elsewhere classified  Acute pain of left knee  Difficulty in walking, not elsewhere classified     Problem List Patient Active Problem List   Diagnosis Date Noted  . TIA (transient ischemic attack)   . Thyroid disease   . Shortness of breath   . Prostate CA ([place]   . Kidney stone   . Hypertension   . Hearing aid worn   . H/O hiatal hernia   . GERD (gastroesophageal reflux disease)   . Family history of anesthesia complication   . Diverticulitis   . Coronary artery disease   .  Complication of anesthesia   . Asthma due to seasonal allergies   . Arthritis   . Anxiety   . Anginal pain ([place]   . Erosive gastropathy 03/26/2017  . Acute blood loss anemia 03/25/2017  . Melena 03/24/2017  . CKD (chronic kidney disease), stage III ([place] 03/24/2017  . AF (paroxysmal atrial fibrillation) ([place] 03/24/2017  . Angina pectoris ([place] 10/09/2015  .  Coronary artery disease due to lipid rich plaque   . Essential hypertension   . Hyperlipemia   . Carotid artery occlusion   . Dizziness 08/09/2015  . Gait instability 05/30/2015  . Myalgia   . Weakness   . Bloating   . Chest pain at rest 03/24/2015  . Atypical chest pain 03/24/2015  . Fatigue 03/14/2015  . Hypothyroidism, acquired 10/26/2013  . OSA (obstructive sleep apnea) 10/18/2013  . Well adult health check 01/02/2013  . Other symptoms involving urinary system(788.99) 12/21/2012  . Benign localized hyperplasia of prostate with urinary obstruction and other lower urinary tract symptoms (LUTS)(600.21) 12/21/2012  . Carcinoma in situ of prostate 12/21/2012  . Left shoulder pain 08/08/2012  . Obesity 07/24/2011  . Generalized anxiety disorder 07/24/2011  . DIVERTICULOSIS OF COLON 12/25/2009  . DERMATITIS, SEBORRHEIC 12/25/2009  . 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 Memory loss 08/14/2009  . BACK PAIN, LUMBAR 07/19/2009  . VENOUS INSUFFICIENCY, CHRONIC 09/09/2006  . GASTROESOPHAGEAL REFLUX, NO ESOPHAGITIS 09/09/2006  . HERNIA, HIATAL, NONCONGENITAL 09/09/2006  . INSOMNIA NOS 09/09/2006   PHYSICAL THERAPY DISCHARGE SUMMARY  Visits from Start of Care: 12  Plan: Patient agrees to discharge.  Patient goals were partially met. Patient is being discharged due to being pleased with the current functional level.  ?????     Scot Jun, PTA 08/25/2017, 3:14 PM  [address] [place], [place], [address] Phone: [phone]   Fax:   [phone]  Name: [name] MRN: [id number] Date of Birth: 10/02/1933

## 2017-09-02 ENCOUNTER — Other Ambulatory Visit: Payer: Self-pay | Admitting: *Deleted

## 2017-09-02 MED ORDER — EZETIMIBE 10 MG PO TABS: 10.0000 mg | ORAL_TABLET | Freq: Every day | ORAL | 1 refills | Status: DC

## 2017-09-26 NOTE — Progress Notes (Addendum)
[place] at [facility] [address], [place], [place] [address] [phone]  Date:  09/27/2017   Name:  [name]   DOB:  1934/05/08   MRN:  [id number]  PCP:  [name], MD    Chief Complaint: Follow-up (Pt here for 3 month follow up )   History of Present Illness:  [name] is a 84 y.o. very pleasant male patient who presents with the following:  3 month follow-up visit today History of CAD, CKD, a fib, TIA, prostate cancer s/p prostatectomy I last saw him in December- he was having sleep issues/ insomnia at that time.  We refilled his prn clonazepam  He was admitted with a Gi bleed last fall, he is still on xarelto due to high risk of CVA:  He has been recommended to stay on xarelto for anticoagulation due to high risk a fib Accompanied by his wife Deloris today He notes that he continues to have some minor bleeding such as bleeding when he will scratch his skin or some bleeding from his gums Also, he notes some difficulty sleeping.  He will go to bed around 11 pm, and then will wake up 1-2 hours later and have a hard time getting back to sleep.  "I can't remember the last time I slept more than 4 hours."   He does note that during the night last night his feet were hurting, and this seemed to stop him from sleeping  He has not tried any medication for sleep- he did have some clonazepam in the past which worked ok- he would rather use something like trazodone.  However, this can interact with his xarelto so is probably not a good idea  He does not feel that urinary sx are keeping him up  He is a pt of [facility]- they last saw him in October Right shoulder and left knee are bothersome to him and limit his mobility- he has been to PT in the past and they did help him He would like to go to PT at [place] [facility] Results  Component Value Date   TSH 4.63 (H)  06/28/2017   We did not change his thyroid med at last check as he was so close to normal- he wants to repeat a TSH and also free T3 Shingrix?  He did some PT for his right shoulder earlier this year-  He is not sure how much this helped but he is done with therapy now He is seeing Dr. Rosana Hoes at [facility] urology for his hematuria He has intermittent gross hematuria- this is thought to be realted to his xaretlo of course, but he is also being evaluated to ensure no sign of CA  His nephrologist is Dr. Joelyn Oms, he will see him in 6 months.  For the time being they are just observing his renal function  His GI is Dr. Paulita Fujita at [place] His biggest concern is that his stomach bothers him-he feels like he has a lot of bloating and discomfort, and is not sure why.  Admits he is afraid this could mean something serious.  he uses gas-x but it does not seem to help  He tends to have gas and bloating- he is worried about this He did have an abd [place] last year.   This was non- specific.  He would like to have a CT scan which is reasonable  He is sometimes constipated he thinks due to his occasional vicodin use for joint pain.  He will use an OTC treatment for constipation as needed He does note that his stools can be lighter in color recently  He wonders about ear wax and would like me to look He is using hearing aids but does not feel like he is hearing as well as he used to  His wife is here today and contributes to the history  BP Readings from Last 3 Encounters:  09/27/17 132/72  07/26/17 116/68  07/22/17 120/74     Patient Active Problem List   Diagnosis Date Noted  . TIA (transient ischemic attack)   . Kidney stone   . Hypertension   . Hearing aid worn   . Coronary artery disease   . Asthma due to seasonal allergies   . Arthritis   . Erosive gastropathy 03/26/2017  . Acute blood loss anemia 03/25/2017  . Melena 03/24/2017  . CKD (chronic kidney disease), stage III ([place]) 03/24/2017  . AF  (paroxysmal atrial fibrillation) ([place]) 03/24/2017  . Angina pectoris ([place]) 10/09/2015  . Essential hypertension   . Hyperlipemia   . Carotid artery occlusion   . Dizziness 08/09/2015  . Gait instability 05/30/2015  . Hypothyroidism, acquired 10/26/2013  . OSA (obstructive sleep apnea) 10/18/2013  . Benign localized hyperplasia of prostate with urinary obstruction and other lower urinary tract symptoms (LUTS)(600.21) 12/21/2012  . Carcinoma in situ of prostate 12/21/2012  . Left shoulder pain 08/08/2012  . Obesity 07/24/2011  . Generalized anxiety disorder 07/24/2011  . DIVERTICULOSIS OF COLON 12/25/2009  . DERMATITIS, SEBORRHEIC 12/25/2009  . 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 Memory loss 08/14/2009  . VENOUS INSUFFICIENCY, CHRONIC 09/09/2006  . GASTROESOPHAGEAL REFLUX, NO ESOPHAGITIS 09/09/2006  . HERNIA, HIATAL, NONCONGENITAL 09/09/2006  . INSOMNIA NOS 09/09/2006    Past Medical History:  Diagnosis Date  . Anginal pain ([place])   . Anxiety    " OCCASIONAL"  . Arthritis   . Asthma due to seasonal allergies    uses inhalers prn  . Carotid artery occlusion    Left  . Complication of anesthesia    " had tremors after prostate surgery  . Coronary artery disease   . Diverticulitis    recurrent  . Family history of anesthesia complication    MOTHER   . GERD (gastroesophageal reflux disease)   . H/O hiatal hernia   . Hearing aid worn   . Hyperlipemia   . Hypertension   . Kidney stone    lithotripsy 3016 w complications, req stents, Dr Rosana Hoes  . Prostate CA ([place])   . Shortness of breath   . Thyroid disease   . TIA (transient ischemic attack)   . Tinnitus     Past Surgical History:  Procedure Laterality Date  . CARDIAC CATHETERIZATION  2008   minimal dz, Dr Cathie Olden  . CARDIAC CATHETERIZATION N/A 10/09/2015   Procedure: Left Heart Cath and Coronary Angiography;  Surgeon: Peter M Martinique, MD;  Location: [place] [facility];  Service: Cardiovascular;  Laterality: N/A;   . CARDIAC CATHETERIZATION N/A 10/09/2015   Procedure: Intravascular Pressure Wire/FFR Study;  Surgeon: Peter M Martinique, MD;  Location: [place] [facility];  Service: Cardiovascular;  Laterality: N/A;  . CARDIAC CATHETERIZATION N/A 10/09/2015   Procedure: Coronary Stent Intervention;  Surgeon: Peter M Martinique, MD;  Location: [place] [facility];  Service: Cardiovascular;  Laterality: N/A;  . CAROTID ENDARTERECTOMY  Left   1998  .  CHOLECYSTECTOMY    . CORONARY STENT PLACEMENT  10/09/2015   DES to RCA  . ESOPHAGOGASTRODUODENOSCOPY (EGD) WITH PROPOFOL Left 03/26/2017   Procedure: ESOPHAGOGASTRODUODENOSCOPY (EGD) WITH PROPOFOL;  Surgeon: Arta Silence, MD;  Location: [facility];  Service: Endoscopy;  Laterality: Left;  . LEFT HEART CATHETERIZATION WITH CORONARY ANGIOGRAM N/A 09/15/2013   Procedure: LEFT HEART CATHETERIZATION WITH CORONARY ANGIOGRAM;  Surgeon: Peter M Martinique, MD;  Location: [facility] C[facility] LAB;  Service: Cardiovascular;  Laterality: N/A;  . PROSTATECTOMY  1998   radical for prostate cancer    Social History   Tobacco Use  . Smoking status: Former Smoker    Packs/day: 2.50    Years: 30.00    Pack years: 75.00    Types: Cigarettes    Last attempt to quit: 08/28/1985    Years since quitting: 32.1  . Smokeless tobacco: Never Used  Substance Use Topics  . Alcohol use: Yes    Alcohol/week: 1.2 oz    Types: 2 Standard drinks or equivalent per week    Comment: 1 glass wine/week (prior 1 glass martini with dinner)  . Drug use: No    Family History  Problem Relation Age of Onset  . Heart disease Mother   . Heart disease Father   . Kidney failure Father   . Heart disease Sister   . Heart attack Sister   . Dementia Sister   . Sjogren's syndrome Child   . Hashimoto's thyroiditis Child   . CAD Child     Allergies  Allergen Reactions  . Ciprofloxacin Other (See Comments)    Hallucinations or jittery Hallucinations or jittery  . Codeine Other (See Comments)     hallucinations hallucinations  . Flexeril [Cyclobenzaprine]   . Methocarbamol   . Sulfa Antibiotics Other (See Comments)    Chills and shaking "serum sickness" Chills and shaking "serum sickness"  . Sulfamethoxazole Other (See Comments)    UNKNOWN REACTION, NOT DOCUMENTED  . Sulfonamide Derivatives Other (See Comments)    Chills and shaking "serum sickness"  . Flagyl [Metronidazole] Rash    Medication list has been reviewed and updated.  Current Outpatient Medications on File Prior to Visit  Medication Sig Dispense Refill  . albuterol (VENTOLIN HFA) 108 (90 Base) MCG/ACT inhaler Inhale 2 puffs into the lungs every 6 (six) hours as needed for wheezing or shortness of breath. 18 g 5  . Artificial Tear Solution (BION TEARS OP) Place 1 drop into both eyes daily as needed (dry eyes).    . carvedilol (COREG) 6.25 MG tablet TAKE 1 TABLET BY MOUTH TWICE A DAY WITH A MEAL 180 tablet 3  . clonazePAM (KLONOPIN) 0.5 MG tablet Take 1/4 or 1/2 tablet at bedtime as needed for insomnia 20 tablet 1  . ezetimibe (ZETIA) 10 MG tablet Take 1 tablet (10 mg total) by mouth daily. 90 tablet 1  . HYDROcodone-acetaminophen (NORCO/VICODIN) 5-325 MG tablet Take 0.5-1 tablets by mouth 2 (two) times daily as needed (pain). 60 tablet 0  . hydrocortisone 2.5 % cream Apply 1 application topically 2 (two) times daily.    Marland Kitchen ipratropium (ATROVENT) 0.03 % nasal spray Place 2 sprays into the nose 4 (four) times daily. 30 mL 5  . levothyroxine (SYNTHROID, LEVOTHROID) 112 MCG tablet Take 1 tablet (112 mcg total) by mouth daily. 30 tablet 6  . liothyronine (CYTOMEL) 5 MCG tablet Take 1 tablet (5 mcg total) by mouth every morning. 90 tablet 3  . losartan-hydrochlorothiazide (HYZAAR) 100-12.5 MG tablet TAKE 1 TABLET BY MOUTH  EVERY EVENING. 90 tablet 2  . NEOMYCIN-POLYMYXIN-HYDROCORTISONE (CORTISPORIN) 1 % SOLN OTIC solution Place 3 drops into both ears every 8 (eight) hours. 10 mL 0  . nitroGLYCERIN (NITROLINGUAL) 0.4 MG/SPRAY  spray Place 1 spray under the tongue every 5 (five) minutes x 3 doses as needed for chest pain. 12 g 12  . Omega-3 Fatty Acids (FISH OIL) 1200 MG CAPS Take 1 capsule (1,200 mg total) by mouth daily. 30 capsule 0  . polyethylene glycol (MIRALAX / GLYCOLAX) packet Take 17 g by mouth daily. Mix in 8 oz liquid and drink    . rivaroxaban (XARELTO) 20 MG TABS tablet Take 1 tablet (20 mg total) by mouth daily with supper. 30 tablet 6  . triamcinolone cream (KENALOG) 0.1 % Apply 1 application topically daily as needed (rash).     No current facility-administered medications on file prior to visit.     Review of Systems:  As per HPI- otherwise negative. No fever or chills No weight loss  Wt Readings from Last 3 Encounters:  09/27/17 221 lb (100.2 kg)  07/26/17 218 lb 12.8 oz (99.2 kg)  07/22/17 221 lb (100.2 kg)      Physical Examination: Vitals:   09/27/17 1010  BP: 132/72  Pulse: 66  Temp: 98 F (36.7 C)  SpO2: 94%   Vitals:   09/27/17 1010  Weight: 221 lb (100.2 kg)  Height: 5\' 6"  (1.676 m)   Body mass index is 35.67 kg/m. Ideal Body Weight: Weight in (lb) to have BMI = 25: 154.6  GEN: WDWN, NAD, Non-toxic, A & O x 3, obese, here today with his wife.  Looks well  HEENT: Atraumatic, Normocephalic. Neck supple. No masses, No LAD.  Bilateral TM wnl, oropharynx normal.  PEERL,EOMI.   Removed some cerumen in his right ear canal Ears and Nose: No external deformity. CV: RRR, No M/G/R. No JVD. No thrill. No extra heart sounds. PULM: CTA B, no wheezes, crackles, rhonchi. No retractions. No resp. distress. No accessory muscle use. Belly is firm c/w intra-abdominal fat but no acute findings on exam ABD: S, NT, ND, +BS. No rebound. No HSM. EXTR: No c/c/e NEURO Normal gait.  PSYCH: Normally interactive. Conversant. Not depressed or anxious appearing.  Calm demeanor.    Assessment and Plan: Acquired hypothyroidism - Plan: TSH, T3, free  Adjustment insomnia  Essential  hypertension  Paroxysmal atrial fibrillation (HCC)  Medication monitoring encounter  History of prostate cancer  Abdominal bloating - Plan: Comprehensive metabolic panel, Amylase, CT Abdomen Pelvis W Contrast  Abdominal distension (gaseous) - Plan: CT Abdomen Pelvis W Contrast  Here today for a follow-up visit Obtain labs today Will order a CT of his belly due to persistent bloating.  He did have an [place] but it was inconclusive Discussed his xaretlo, nephrology and urology follow-up plans Will plan further follow- up pending labs.   Signed Lamar Blinks, MD  Received his labs 3/19- message to pt:  Thyroid levels look great- let's continue your current regimen Metabolic profile is fine except for your known kidney issues (BUN, creatinine, GFR), and your bilirubin is a bit up.  This also is not a new finding for you, and is likely benign.  Amylase (pancreatic function) is ok- we get concerned if this number is too high, but a little bit low is ok  I will watch for your CT reports- let me know if any problems getting these scheduled  Please come in for a repeat metabolic profile in 3-4 weeks.  I want to monitor your kidneys a little more closely Results for orders placed or performed in visit on 09/27/17  TSH  Result Value Ref Range   TSH 1.05 0.35 - 4.50 uIU/mL  T3, free  Result Value Ref Range   T3, Free 3.3 2.3 - 4.2 pg/mL  Comprehensive metabolic panel  Result Value Ref Range   Sodium 141 135 - 145 mEq/L   Potassium 4.2 3.5 - 5.1 mEq/L   Chloride 100 96 - 112 mEq/L   CO2 29 19 - 32 mEq/L   Glucose, Bld 114 (H) 70 - 99 mg/dL   BUN 25 (H) 6 - 23 mg/dL   Creatinine, Ser 1.84 (H) 0.40 - 1.50 mg/dL   Total Bilirubin 2.0 (H) 0.2 - 1.2 mg/dL   Alkaline Phosphatase 51 39 - 117 U/L   AST 19 0 - 37 U/L   ALT 31 0 - 53 U/L   Total Protein 7.5 6.0 - 8.3 g/dL   Albumin 4.3 3.5 - 5.2 g/dL   Calcium 10.2 8.4 - 10.5 mg/dL   GFR 37.61 (L) >60.00 mL/min  Amylase  Result Value  Ref Range   Amylase 24 (L) 27 - 131 U/L

## 2017-09-27 ENCOUNTER — Encounter: Payer: Self-pay | Admitting: Family Medicine

## 2017-09-27 ENCOUNTER — Ambulatory Visit: Payer: Medicare Other | Admitting: Family Medicine

## 2017-09-27 VITALS — BP 132/72 | HR 66 | Temp 98.0°F | Ht 66.0 in | Wt 221.0 lb

## 2017-09-27 DIAGNOSIS — N183 Chronic kidney disease, stage 3 unspecified: Secondary | ICD-10-CM

## 2017-09-27 DIAGNOSIS — Z5181 Encounter for therapeutic drug level monitoring: Secondary | ICD-10-CM | POA: Diagnosis not present

## 2017-09-27 DIAGNOSIS — Z8546 Personal history of malignant neoplasm of prostate: Secondary | ICD-10-CM | POA: Diagnosis not present

## 2017-09-27 DIAGNOSIS — E039 Hypothyroidism, unspecified: Secondary | ICD-10-CM | POA: Diagnosis not present

## 2017-09-27 DIAGNOSIS — I10 Essential (primary) hypertension: Secondary | ICD-10-CM | POA: Diagnosis not present

## 2017-09-27 DIAGNOSIS — I480 Paroxysmal atrial fibrillation: Secondary | ICD-10-CM | POA: Diagnosis not present

## 2017-09-27 DIAGNOSIS — F5102 Adjustment insomnia: Secondary | ICD-10-CM

## 2017-09-27 DIAGNOSIS — R140 Abdominal distension (gaseous): Secondary | ICD-10-CM | POA: Diagnosis not present

## 2017-09-27 LAB — T3, FREE: T3, Free: 3.3 pg/mL (ref 2.3–4.2)

## 2017-09-27 LAB — COMPREHENSIVE METABOLIC PANEL
ALK PHOS: 51 U/L (ref 39–117)
ALT: 31 U/L (ref 0–53)
AST: 19 U/L (ref 0–37)
Albumin: 4.3 g/dL (ref 3.5–5.2)
BILIRUBIN TOTAL: 2 mg/dL — AB (ref 0.2–1.2)
BUN: 25 mg/dL — ABNORMAL HIGH (ref 6–23)
CALCIUM: 10.2 mg/dL (ref 8.4–10.5)
CO2: 29 meq/L (ref 19–32)
Chloride: 100 mEq/L (ref 96–112)
Creatinine, Ser: 1.84 mg/dL — ABNORMAL HIGH (ref 0.40–1.50)
GFR: 37.61 mL/min — ABNORMAL LOW (ref >60.00)
Glucose, Bld: 114 mg/dL — ABNORMAL HIGH (ref 70–99)
Potassium: 4.2 mEq/L (ref 3.5–5.1)
Sodium: 141 mEq/L (ref 135–145)
TOTAL PROTEIN: 7.5 g/dL (ref 6.0–8.3)

## 2017-09-27 LAB — AMYLASE: Amylase: 24 U/L — ABNORMAL LOW (ref 27–131)

## 2017-09-27 LAB — TSH: TSH: 1.05 u[IU]/mL (ref 0.35–4.50)

## 2017-09-27 NOTE — Patient Instructions (Signed)
Great to see you today- I am going to check your thyroid, liver and kidney function. We will arrange a CT of your abdomen and pelvis to ensure no cause for concern. Take care!

## 2017-09-28 ENCOUNTER — Encounter: Payer: Self-pay | Admitting: Family Medicine

## 2017-09-28 NOTE — Addendum Note (Signed)
Addended by: [name] on: 09/28/2017 06:48 AM   Modules accepted: Orders

## 2017-09-29 ENCOUNTER — Encounter (HOSPITAL_BASED_OUTPATIENT_CLINIC_OR_DEPARTMENT_OTHER): Payer: Self-pay

## 2017-09-29 ENCOUNTER — Ambulatory Visit (HOSPITAL_BASED_OUTPATIENT_CLINIC_OR_DEPARTMENT_OTHER)
Admission: RE | Admit: 2017-09-29 | Discharge: 2017-09-29 | Disposition: A | Discharge status: Home / Self Care | Payer: Medicare Other | Source: Ambulatory Visit | Attending: Family Medicine | Admitting: Family Medicine

## 2017-09-29 DIAGNOSIS — R140 Abdominal distension (gaseous): Secondary | ICD-10-CM | POA: Diagnosis not present

## 2017-09-29 DIAGNOSIS — I700 Atherosclerosis of aorta: Secondary | ICD-10-CM | POA: Insufficient documentation

## 2017-09-29 DIAGNOSIS — N200 Calculus of kidney: Secondary | ICD-10-CM | POA: Diagnosis not present

## 2017-09-29 DIAGNOSIS — K5730 Diverticulosis of large intestine without perforation or abscess without bleeding: Secondary | ICD-10-CM | POA: Insufficient documentation

## 2017-09-29 MED ORDER — IOPAMIDOL (ISOVUE-300) INJECTION 61%
100.0000 mL | Freq: Once | INTRAVENOUS | Status: AC | PRN
  Administered 2017-09-29: 80 mL via INTRAVENOUS

## 2017-10-01 ENCOUNTER — Encounter: Payer: Self-pay | Admitting: Family Medicine

## 2017-10-01 ENCOUNTER — Other Ambulatory Visit: Payer: Self-pay | Admitting: Emergency Medicine

## 2017-10-01 DIAGNOSIS — J300 Vasomotor rhinitis: Secondary | ICD-10-CM

## 2017-10-01 MED ORDER — IPRATROPIUM BROMIDE 0.03 % NA SOLN: 2.0000 | Freq: Four times a day (QID) | NASAL | 1 refills | Status: DC

## 2017-10-08 ENCOUNTER — Encounter: Payer: Self-pay | Admitting: Family Medicine

## 2017-10-09 ENCOUNTER — Ambulatory Visit: Payer: Medicare Other | Admitting: Family Medicine

## 2017-10-09 ENCOUNTER — Encounter: Payer: Self-pay | Admitting: Family Medicine

## 2017-10-09 VITALS — BP 132/64 | HR 67 | Temp 97.8°F | Wt 218.0 lb

## 2017-10-09 DIAGNOSIS — K439 Ventral hernia without obstruction or gangrene: Secondary | ICD-10-CM | POA: Diagnosis not present

## 2017-10-09 DIAGNOSIS — R58 Hemorrhage, not elsewhere classified: Secondary | ICD-10-CM | POA: Diagnosis not present

## 2017-10-09 NOTE — Progress Notes (Signed)
Subjective:  Patient ID: [name], male    DOB: 03/31/1934  Age: 84 y.o. MRN: [id number]  CC: knot on rib (bruised, x5 days)   HPI [name] presents for ecchymosis of rt upper quadrant sp violent coughing spell p aspirating coffee. Taking xarelto for a fib and stopped it after developing bruise.  Outpatient Medications Prior to Visit  Medication Sig Dispense Refill  . albuterol (VENTOLIN HFA) 108 (90 Base) MCG/ACT inhaler Inhale 2 puffs into the lungs every 6 (six) hours as needed for wheezing or shortness of breath. 18 g 5  . Artificial Tear Solution (BION TEARS OP) Place 1 drop into both eyes daily as needed (dry eyes).    . carvedilol (COREG) 6.25 MG tablet TAKE 1 TABLET BY MOUTH TWICE A DAY WITH A MEAL 180 tablet 3  . clonazePAM (KLONOPIN) 0.5 MG tablet Take 1/4 or 1/2 tablet at bedtime as needed for insomnia 20 tablet 1  . ezetimibe (ZETIA) 10 MG tablet Take 1 tablet (10 mg total) by mouth daily. 90 tablet 1  . HYDROcodone-acetaminophen (NORCO/VICODIN) 5-325 MG tablet Take 0.5-1 tablets by mouth 2 (two) times daily as needed (pain). 60 tablet 0  . hydrocortisone 2.5 % cream Apply 1 application topically 2 (two) times daily.    Marland Kitchen ipratropium (ATROVENT) 0.03 % nasal spray Place 2 sprays into the nose 4 (four) times daily. 30 mL 1  . levothyroxine (SYNTHROID, LEVOTHROID) 112 MCG tablet Take 1 tablet (112 mcg total) by mouth daily. 30 tablet 6  . liothyronine (CYTOMEL) 5 MCG tablet Take 1 tablet (5 mcg total) by mouth every morning. 90 tablet 3  . losartan-hydrochlorothiazide (HYZAAR) 100-12.5 MG tablet TAKE 1 TABLET BY MOUTH EVERY EVENING. 90 tablet 2  . NEOMYCIN-POLYMYXIN-HYDROCORTISONE (CORTISPORIN) 1 % SOLN OTIC solution Place 3 drops into both ears every 8 (eight) hours. 10 mL 0  . nitroGLYCERIN (NITROLINGUAL) 0.4 MG/SPRAY spray Place 1 spray under the tongue every 5 (five) minutes x 3 doses as needed for chest pain. 12 g 12  . Omega-3 Fatty Acids (FISH OIL) 1200 MG CAPS Take  1 capsule (1,200 mg total) by mouth daily. 30 capsule 0  . polyethylene glycol (MIRALAX / GLYCOLAX) packet Take 17 g by mouth daily. Mix in 8 oz liquid and drink    . rivaroxaban (XARELTO) 20 MG TABS tablet Take 1 tablet (20 mg total) by mouth daily with supper. 30 tablet 6  . triamcinolone cream (KENALOG) 0.1 % Apply 1 application topically daily as needed (rash).     No facility-administered medications prior to visit.     ROS Review of Systems  Constitutional: Negative.   HENT: Negative.   Eyes: Negative.   Respiratory: Negative.   Cardiovascular: Negative.   Gastrointestinal: Negative for abdominal distention, abdominal pain, nausea and vomiting.  Genitourinary: Negative.   Musculoskeletal: Negative.   Allergic/Immunologic: Negative for immunocompromised state.  Neurological: Negative for headaches.  Hematological: Bruises/bleeds easily.  Psychiatric/Behavioral: Negative.     Objective:  BP 132/64 (BP Location: Left Arm, Patient Position: Sitting, Cuff Size: Large)   Pulse 67   Temp 97.8 F (36.6 C) (Oral)   Wt 218 lb (98.9 kg)   BMI 35.19 kg/m   BP Readings from Last 3 Encounters:  10/09/17 132/64  09/27/17 132/72  07/26/17 116/68    Wt Readings from Last 3 Encounters:  10/09/17 218 lb (98.9 kg)  09/27/17 221 lb (100.2 kg)  07/26/17 218 lb 12.8 oz (99.2 kg)    Physical Exam  Constitutional: He appears well-developed and well-nourished. No distress.  HENT:  Head: Normocephalic and atraumatic.  Right Ear: External ear normal.  Left Ear: External ear normal.  Mouth/Throat: Oropharynx is clear and moist. No oropharyngeal exudate.  Eyes: Pupils are equal, round, and reactive to light. Conjunctivae are normal. Right eye exhibits no discharge. Left eye exhibits no discharge. No scleral icterus.  Neck: Neck supple. No JVD present. No tracheal deviation present.  Cardiovascular: Normal rate, regular rhythm and normal heart sounds.  Pulmonary/Chest: Effort normal and  breath sounds normal. No stridor.  Abdominal: Soft. Bowel sounds are normal. He exhibits distension. There is no tenderness. There is no rigidity, no rebound, no guarding and no CVA tenderness. A hernia is present. Hernia confirmed positive in the ventral area.    Skin: He is not diaphoretic.    Lab Results  Component Value Date   WBC 5.0 04/05/2017   HGB 13.7 04/05/2017   HCT 40.2 04/05/2017   PLT 150.0 04/05/2017   GLUCOSE 114 (H) 09/27/2017   CHOL 153 07/09/2017   TRIG 198 (H) 07/09/2017   HDL 38 (L) 07/09/2017   LDLDIRECT 70 12/21/2012   LDLCALC 75 07/09/2017   ALT 31 09/27/2017   AST 19 09/27/2017   NA 141 09/27/2017   K 4.2 09/27/2017   CL 100 09/27/2017   CREATININE 1.84 (H) 09/27/2017   BUN 25 (H) 09/27/2017   CO2 29 09/27/2017   TSH 1.05 09/27/2017   PSA 0.00 Repeated and verified X2. (L) 05/11/2016   INR 1.04 10/02/2015   HGBA1C 5.2 03/03/2017    Ct Abdomen Pelvis W Contrast  Result Date: 09/29/2017 CLINICAL DATA:  Abdominal distention, abdominal bloating over the last 6 months EXAM: CT ABDOMEN AND PELVIS WITH CONTRAST TECHNIQUE: Multidetector CT imaging of the abdomen and pelvis was performed using the standard protocol following bolus administration of intravenous contrast. CONTRAST:  63mL ISOVUE-300 IOPAMIDOL (ISOVUE-300) INJECTION 61% COMPARISON:  Ultrasound the abdomen of 07/22/2016 and CT abdomen of 06/03/2011 FINDINGS: Lower chest: The lungs are clear other than minimal prominence of markings in the inferolateral right middle lobe of doubtful significance. No pleural effusion is seen. The heart is within normal limits in size. Considerable mitral annular calcification is present. Hepatobiliary: The liver enhances and as noted on prior ultrasound there is a tiny low-attenuation structure within the dome of the left lobe consistent with cyst of no more than 7 mm in diameter. No other hepatic lesion is seen. Surgical clips are present from prior cholecystectomy.  Pancreas: The pancreas is normal in size in the pancreatic duct is not dilated. Spleen: The spleen is unremarkable and stable. Adrenals/Urinary Tract: The adrenal glands appear normal. The right kidney appears atrophic and there are nonobstructing calculi in the right mid and lower pole. These measure approximately 5 and 6 mm in diameter respectively. No hydronephrosis is seen. No left renal calculi are noted. The ureters appear normal in caliber. The urinary bladder is not optimally distended but no abnormality is seen. Stomach/Bowel: The stomach is largely decompressed and difficult to evaluate. No small bowel distention is seen. There are multiple rectosigmoid colonic diverticula present but no diverticulitis is seen. A few diverticula also are scattered throughout the descending colon. The colon is largely decompressed. Terminal ileum is opacified and appears normal and the appendix is somewhat short but normal. No inflammatory process is seen. Vascular/Lymphatic: Moderate abdominal aortic atherosclerosis is present. No aneurysmal dilatation of the abdominal aorta is seen. Reproductive: Prostate has previously been resected with multiple  surgical clips present within the mid pelvis. Other: No abdominal wall hernia is seen. Musculoskeletal: The lumbar vertebrae are in normal alignment with relatively normal intervertebral disc spaces. Very slight lumbar lordosis is noted. Mild degenerative disc disease is present at L4-5. IMPRESSION: 1. No explanation for the patient's abdominal distention and bloating is seen. The appendix and terminal ileum are unremarkable. 2. Primarily rectosigmoid colon diverticula.  No diverticulitis. 3. Two nonobstructing right renal calculi of 5 and 6 mm in diameter. 4. Moderate abdominal aortic atherosclerosis. Electronically Signed   By: Ivar Drape M.D.   On: 09/29/2017 16:43    Assessment & Plan:   Rage was seen today for knot on rib.  Diagnoses and all orders for this  visit:  Ecchymosis on examination  Ventral hernia without obstruction or gangrene   I am having Antony Haste E. Obarr maintain his Artificial Tear Solution (BION TEARS OP), polyethylene glycol, triamcinolone cream, liothyronine, HYDROcodone-acetaminophen, carvedilol, hydrocortisone, levothyroxine, losartan-hydrochlorothiazide, rivaroxaban, nitroGLYCERIN, clonazePAM, NEOMYCIN-POLYMYXIN-HYDROCORTISONE, albuterol, Fish Oil, ezetimibe, and ipratropium.  No orders of the defined types were placed in this encounter.  Pt felt as though ventral hernia noted on exam may be new. He  Will fu with primary md. Hernia was easily reducible and recommended reassurance. Restart xarelto.  Follow-up: No follow-ups on file.  Libby Maw, MD

## 2017-10-11 ENCOUNTER — Encounter: Payer: Self-pay | Admitting: Family Medicine

## 2017-10-11 ENCOUNTER — Ambulatory Visit: Payer: Medicare Other | Admitting: Family Medicine

## 2017-10-11 VITALS — BP 128/82 | HR 72 | Resp 18 | Ht 66.0 in | Wt 218.0 lb

## 2017-10-11 DIAGNOSIS — R58 Hemorrhage, not elsewhere classified: Secondary | ICD-10-CM | POA: Diagnosis not present

## 2017-10-11 DIAGNOSIS — E039 Hypothyroidism, unspecified: Secondary | ICD-10-CM

## 2017-10-11 DIAGNOSIS — K439 Ventral hernia without obstruction or gangrene: Secondary | ICD-10-CM

## 2017-10-11 MED ORDER — LEVOTHYROXINE SODIUM 112 MCG PO TABS: 112.0000 ug | ORAL_TABLET | Freq: Every day | ORAL | 3 refills | Status: DC

## 2017-10-11 MED ORDER — LIOTHYRONINE SODIUM 5 MCG PO TABS: 5.0000 ug | ORAL_TABLET | Freq: Every morning | ORAL | 3 refills | Status: DC

## 2017-10-11 NOTE — Progress Notes (Signed)
[place] at [facility] [address] [place], [place] [address] [phone]  Date:  10/11/2017   Name:  [name]   DOB:  [birth year]   MRN:  [id number]  PCP:  Zachary Mclean, MD    Chief Complaint: Hernia (bruising)   History of Present Illness:  [name] is a 84 y.o. very pleasant male patient who presents with the following:  Seen in Saturday clinic this past weekend by Dr. Ethelene King-    I am having [name] maintain his Artificial Tear Solution (BION TEARS OP), polyethylene glycol, triamcinolone cream, liothyronine, HYDROcodone-acetaminophen, carvedilol, hydrocortisone, levothyroxine, losartan-hydrochlorothiazide, rivaroxaban, nitroGLYCERIN, clonazePAM, NEOMYCIN-POLYMYXIN-HYDROCORTISONE, albuterol, Fish Oil, ezetimibe, and ipratropium.  No orders of the defined types were placed in this encounter.  Pt felt as though ventral hernia noted on exam may be new. He  Will fu with primary md. Hernia was easily reducible and recommended reassurance. Restart xarelto.  About 8 days ago he was drinking coffee and choked-  he aspirated some coffee, choked and gagged. He was coughing hard and we think that he bumped his side on the corner on his counter top.  A couple of days later a large bruise appeared on his right abdomen just later to the midline in the epigastric line.  He got worried about this and went to Saturday clinic as above.  On exam Dr. Ethelene King noted a ventral hernia which [name] is pretty sure is a new finding.  However it is not painful at all.  He thinks he is ok but was asked to follow-up with me this week so he came in as asked  He tried klonopin for sleep, however he really did not tolerate this - will remove from list He will use vicodin on occasion but does cause some constipation   No blood in his stools No vomiting He is eating normally No fever  Accompanied by his wife [name] today who helps give the  history  Lab Results  Component Value Date   TSH 1.05 09/27/2017     Patient Active Problem List   Diagnosis Date Noted  . Ecchymosis on examination 10/09/2017  . Ventral hernia without obstruction or gangrene 10/09/2017  . TIA (transient ischemic attack)   . Kidney stone   . Hypertension   . Hearing aid worn   . Coronary artery disease   . Asthma due to seasonal allergies   . Arthritis   . Erosive gastropathy 03/26/2017  . Acute blood loss anemia 03/25/2017  . Melena 03/24/2017  . CKD (chronic kidney disease), stage III ([name]) 03/24/2017  . AF (paroxysmal atrial fibrillation) ([name]) 03/24/2017  . Angina pectoris ([name]) 10/09/2015  . Essential hypertension   . Hyperlipemia   . Carotid artery occlusion   . Dizziness 08/09/2015  . Gait instability 05/30/2015  . Hypothyroidism, acquired 10/26/2013  . OSA (obstructive sleep apnea) 10/18/2013  . Benign localized hyperplasia of prostate with urinary obstruction and other lower urinary tract symptoms (LUTS)(600.21) 12/21/2012  . Carcinoma in situ of prostate 12/21/2012  . Left shoulder pain 08/08/2012  . Obesity 07/24/2011  . Generalized anxiety disorder 07/24/2011  . DIVERTICULOSIS OF COLON 12/25/2009  . DERMATITIS, SEBORRHEIC 12/25/2009  . 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 Memory loss 08/14/2009  . VENOUS INSUFFICIENCY, CHRONIC 09/09/2006  . GASTROESOPHAGEAL REFLUX, NO ESOPHAGITIS 09/09/2006  . HERNIA, HIATAL, NONCONGENITAL 09/09/2006  . INSOMNIA NOS 09/09/2006    Past Medical History:  Diagnosis Date  .  Anginal pain ([name])   . Anxiety    " OCCASIONAL"  . Arthritis   . Asthma due to seasonal allergies    uses inhalers prn  . Carotid artery occlusion    Left  . Complication of anesthesia    " had tremors after prostate surgery  . Coronary artery disease   . Diverticulitis    recurrent  . Family history of anesthesia complication    MOTHER   . GERD (gastroesophageal reflux disease)   . H/O hiatal hernia    . Hearing aid worn   . Hyperlipemia   . Hypertension   . Kidney stone    lithotripsy 2423 w complications, req stents, Dr Rosana Hoes  . Prostate CA ([name])   . Shortness of breath   . Thyroid disease   . TIA (transient ischemic attack)   . Tinnitus     Past Surgical History:  Procedure Laterality Date  . CARDIAC CATHETERIZATION  2008   minimal dz, Dr Cathie Olden  . CARDIAC CATHETERIZATION N/A 10/09/2015   Procedure: Left Heart Cath and Coronary Angiography;  Surgeon: Peter M Martinique, MD;  Location: [place];  Service: Cardiovascular;  Laterality: N/A;  . CARDIAC CATHETERIZATION N/A 10/09/2015   Procedure: Intravascular Pressure Wire/FFR Study;  Surgeon: Peter M Martinique, MD;  Location: [place] [facility];  Service: Cardiovascular;  Laterality: N/A;  . CARDIAC CATHETERIZATION N/A 10/09/2015   Procedure: Coronary Stent Intervention;  Surgeon: Peter M Martinique, MD;  Location: [facility];  Service: Cardiovascular;  Laterality: N/A;  . CAROTID ENDARTERECTOMY  Left   1998  . CHOLECYSTECTOMY    . CORONARY STENT PLACEMENT  10/09/2015   DES to RCA  . ESOPHAGOGASTRODUODENOSCOPY (EGD) WITH PROPOFOL Left 03/26/2017   Procedure: ESOPHAGOGASTRODUODENOSCOPY (EGD) WITH PROPOFOL;  Surgeon: Arta Silence, MD;  Location: [name];  Service: [facility];  Laterality: Left;  . LEFT HEART CATHETERIZATION WITH CORONARY ANGIOGRAM N/A 09/15/2013   Procedure: LEFT HEART CATHETERIZATION WITH CORONARY ANGIOGRAM;  Surgeon: Peter M Martinique, MD;  Location: [facility] C[facility] LAB;  Service: Cardiovascular;  Laterality: N/A;  . PROSTATECTOMY  1998   radical for prostate cancer    Social History   Tobacco Use  . Smoking status: Former Smoker    Packs/day: 2.50    Years: 30.00    Pack years: 75.00    Types: Cigarettes    Last attempt to quit: 08/28/1985    Years since quitting: 32.1  . Smokeless tobacco: Never Used  Substance Use Topics  . Alcohol use: Yes    Alcohol/week: 1.2 oz    Types: 2 Standard drinks or  equivalent per week    Comment: 1 glass wine/week (prior 1 glass martini with dinner)  . Drug use: No    Family History  Problem Relation Age of Onset  . Heart disease Mother   . Heart disease Father   . Kidney failure Father   . Heart disease Sister   . Heart attack Sister   . Dementia Sister   . Sjogren's syndrome Child   . Hashimoto's thyroiditis Child   . CAD Child     Allergies  Allergen Reactions  . Ciprofloxacin Other (See Comments)    Hallucinations or jittery Hallucinations or jittery  . Codeine Other (See Comments)    hallucinations hallucinations  . Flexeril [Cyclobenzaprine]   . Methocarbamol   . Sulfa Antibiotics Other (See Comments)    Chills and shaking "serum sickness" Chills and shaking "serum sickness"  . Sulfamethoxazole Other (See Comments)  UNKNOWN REACTION, NOT DOCUMENTED  . Sulfonamide Derivatives Other (See Comments)    Chills and shaking "serum sickness"  . Flagyl [Metronidazole] Rash    Medication list has been reviewed and updated.  Current Outpatient Medications on File Prior to Visit  Medication Sig Dispense Refill  . albuterol (VENTOLIN HFA) 108 (90 Base) MCG/ACT inhaler Inhale 2 puffs into the lungs every 6 (six) hours as needed for wheezing or shortness of breath. 18 g 5  . Artificial Tear Solution (BION TEARS OP) Place 1 drop into both eyes daily as needed (dry eyes).    . carvedilol (COREG) 6.25 MG tablet TAKE 1 TABLET BY MOUTH TWICE A DAY WITH A MEAL 180 tablet 3  . clobetasol (TEMOVATE) 0.05 % external solution APPLY TO SCALP DAILY AS NEEDED  3  . clonazePAM (KLONOPIN) 0.5 MG tablet Take 1/4 or 1/2 tablet at bedtime as needed for insomnia 20 tablet 1  . ezetimibe (ZETIA) 10 MG tablet Take 1 tablet (10 mg total) by mouth daily. 90 tablet 1  . HYDROcodone-acetaminophen (NORCO/VICODIN) 5-325 MG tablet Take 0.5-1 tablets by mouth 2 (two) times daily as needed (pain). 60 tablet 0  . hydrocortisone 2.5 % cream Apply 1 application  topically 2 (two) times daily.    Marland Kitchen ipratropium (ATROVENT) 0.03 % nasal spray Place 2 sprays into the nose 4 (four) times daily. 30 mL 1  . levothyroxine (SYNTHROID, LEVOTHROID) 112 MCG tablet Take 1 tablet (112 mcg total) by mouth daily. 30 tablet 6  . liothyronine (CYTOMEL) 5 MCG tablet Take 1 tablet (5 mcg total) by mouth every morning. 90 tablet 3  . losartan-hydrochlorothiazide (HYZAAR) 100-12.5 MG tablet TAKE 1 TABLET BY MOUTH EVERY EVENING. 90 tablet 2  . NEOMYCIN-POLYMYXIN-HYDROCORTISONE (CORTISPORIN) 1 % SOLN OTIC solution Place 3 drops into both ears every 8 (eight) hours. 10 mL 0  . nitroGLYCERIN (NITROLINGUAL) 0.4 MG/SPRAY spray Place 1 spray under the tongue every 5 (five) minutes x 3 doses as needed for chest pain. 12 g 12  . Omega-3 Fatty Acids (FISH OIL) 1200 MG CAPS Take 1 capsule (1,200 mg total) by mouth daily. 30 capsule 0  . polyethylene glycol (MIRALAX / GLYCOLAX) packet Take 17 g by mouth daily. Mix in 8 oz liquid and drink    . rivaroxaban (XARELTO) 20 MG TABS tablet Take 1 tablet (20 mg total) by mouth daily with supper. 30 tablet 6  . triamcinolone cream (KENALOG) 0.1 % Apply 1 application topically daily as needed (rash).     No current facility-administered medications on file prior to visit.     Review of Systems:  As per HPI- otherwise negative.   Physical Examination: Vitals:   10/11/17 1544  BP: 128/82  Pulse: 72  Resp: 18  SpO2: 98%   Vitals:   10/11/17 1544  Weight: 218 lb (98.9 kg)  Height: 5\' 6"  (1.676 m)   Body mass index is 35.19 kg/m. Ideal Body Weight: Weight in (lb) to have BMI = 25: 154.6  GEN: WDWN, NAD, Non-toxic, A & O x 3 HEENT: Atraumatic, Normocephalic. Neck supple. No masses, No LAD. Ears and Nose: No external deformity. CV: RRR, No M/G/R. No JVD. No thrill. No extra heart sounds. PULM: CTA B, no wheezes, crackles, rhonchi. No retractions. No resp. distress. No accessory muscle use. ABD: S, NT, ND, +BS. No rebound. No HSM.   Obese abdomen.  To the right of midline below the nipples there is a large bruise with a firm non tender area in the  bruise likely representing resolving hematoma.  Pt reports the bruise is reduced from previous.  He also has a moderate ventral midline hernia visible when he increases intra-abd pressure such as with doing a sit-up.  This is non- tender EXTR: No c/c/e NEURO Normal gait.  PSYCH: Normally interactive. Conversant. Not depressed or anxious appearing.  Calm demeanor.    Assessment and Plan: Ecchymosis on examination  Acquired hypothyroidism - Plan: levothyroxine (SYNTHROID, LEVOTHROID) 112 MCG tablet, liothyronine (CYTOMEL) 5 MCG tablet  Ventral hernia without obstruction or gangrene  Needs refills of his thyroid meds today Recent TSH looked fine Lab Results  Component Value Date   TSH 1.05 09/27/2017  discussed ventral hernia with him.  He does not wish to pursue any further treatment unless absolutely necessary. He will continue to monitor for now and will alert me if any concerns.  Bruise - likely due to ruptured superficial capillaries and/ or some new tearing of abd muscles during coughing fit- is getting better He will continue to monitor this as well    Signed Lamar Blinks, MD

## 2017-10-11 NOTE — Patient Instructions (Signed)
Good to see you today!  Please let me know if your abdomen does not continue to get better- you do appear to have a ventral hernia which very rarely needs to be repaired

## 2017-10-13 ENCOUNTER — Other Ambulatory Visit: Payer: Self-pay

## 2017-10-13 DIAGNOSIS — Z9889 Other specified postprocedural states: Secondary | ICD-10-CM

## 2017-10-13 DIAGNOSIS — I6523 Occlusion and stenosis of bilateral carotid arteries: Secondary | ICD-10-CM

## 2017-10-13 DIAGNOSIS — I6521 Occlusion and stenosis of right carotid artery: Secondary | ICD-10-CM

## 2017-10-27 ENCOUNTER — Ambulatory Visit: Payer: Medicare Other | Admitting: Family

## 2017-10-27 ENCOUNTER — Encounter (HOSPITAL_COMMUNITY): Payer: Medicare Other

## 2017-10-28 ENCOUNTER — Ambulatory Visit: Payer: Medicare Other | Admitting: Family

## 2017-10-28 ENCOUNTER — Encounter: Payer: Self-pay | Admitting: Family

## 2017-10-28 ENCOUNTER — Other Ambulatory Visit: Payer: Self-pay

## 2017-10-28 ENCOUNTER — Ambulatory Visit (HOSPITAL_COMMUNITY)
Admission: RE | Admit: 2017-10-28 | Discharge: 2017-10-28 | Disposition: A | Discharge status: Home / Self Care | Payer: Medicare Other | Source: Ambulatory Visit | Attending: Family | Admitting: Family

## 2017-10-28 VITALS — BP 137/79 | HR 70 | Temp 97.1°F | Resp 18 | Ht 66.0 in | Wt 217.0 lb

## 2017-10-28 DIAGNOSIS — I6521 Occlusion and stenosis of right carotid artery: Secondary | ICD-10-CM | POA: Diagnosis not present

## 2017-10-28 DIAGNOSIS — Z9889 Other specified postprocedural states: Secondary | ICD-10-CM | POA: Diagnosis not present

## 2017-10-28 DIAGNOSIS — I6523 Occlusion and stenosis of bilateral carotid arteries: Secondary | ICD-10-CM | POA: Insufficient documentation

## 2017-10-28 NOTE — Patient Instructions (Signed)

## 2017-10-28 NOTE — Progress Notes (Signed)
Chief Complaint: Follow up Extracranial Carotid Artery Stenosis   History of Present Illness  [name] is a 84 y.o. male who is status post left carotid endarterectomy in 1998 by Dr. Amedeo Plenty, assisted by Dr. Scot Dock.  He had two preoperative TIA's, both with amaurosis fugax in the left eye. Pt denies any known subsequent TIA or stroke.  Dr. Scot Dock evaluated pt in February, 2017 with a 60-79% right carotid stenosis. As this was asymptomatic, Dr. Scot Dock recommended follow up carotid duplex in 6 months. The patient was on aspirin and was also on a statin.   He has undergone PCTA in March 2017 and has done well from that standpoint.   He does not seem to have claudication symptoms with walking. His wife states he is dyspneic with climbing stairs. He does have a stationary bike but does not use it.  He also seems to have arthritic pain.   Pt Diabetic: no Pt smoker: former smoker, quit in 1987, smoked 2-3 ppd, started at age 50  Pt meds include: Statin : no, may have had arthralgias with statin ASA: no Other anticoagulants/antiplatelets: Xarelto, pt states he had an episode of atrial fib   Past Medical History:  Diagnosis Date  . Anginal pain ([place])   . Anxiety    " OCCASIONAL"  . Arthritis   . Asthma due to seasonal allergies    uses inhalers prn  . Carotid artery occlusion    Left  . Complication of anesthesia    " had tremors after prostate surgery  . Coronary artery disease   . Diverticulitis    recurrent  . Family history of anesthesia complication    MOTHER   . GERD (gastroesophageal reflux disease)   . H/O hiatal hernia   . Hearing aid worn   . Hyperlipemia   . Hypertension   . Kidney stone    lithotripsy 8916 w complications, req stents, Dr Rosana Hoes  . Prostate CA ([place])   . Shortness of breath   . Thyroid disease   . TIA (transient ischemic attack)   . Tinnitus     Social History Social History   Tobacco Use  . Smoking status: Former Smoker   Packs/day: 2.50    Years: 30.00    Pack years: 75.00    Types: Cigarettes    Last attempt to quit: 08/28/1985    Years since quitting: 32.1  . Smokeless tobacco: Never Used  Substance Use Topics  . Alcohol use: Yes    Alcohol/week: 1.2 oz    Types: 2 Standard drinks or equivalent per week    Comment: 1 glass wine/week (prior 1 glass martini with dinner)  . Drug use: No    Family History Family History  Problem Relation Age of Onset  . Heart disease Mother   . Heart disease Father   . Kidney failure Father   . Heart disease Sister   . Heart attack Sister   . Dementia Sister   . Sjogren's syndrome Child   . Hashimoto's thyroiditis Child   . CAD Child     Surgical History Past Surgical History:  Procedure Laterality Date  . CARDIAC CATHETERIZATION  2008   minimal dz, Dr Cathie Olden  . CARDIAC CATHETERIZATION N/A 10/09/2015   Procedure: Left Heart Cath and Coronary Angiography;  Surgeon: Peter M Martinique, MD;  Location: [facility];  Service: Cardiovascular;  Laterality: N/A;  . CARDIAC CATHETERIZATION N/A 10/09/2015   Procedure: Intravascular Pressure Wire/FFR Study;  Surgeon: [name]  M Martinique, MD;  Location: [facility];  Service: Cardiovascular;  Laterality: N/A;  . CARDIAC CATHETERIZATION N/A 10/09/2015   Procedure: Coronary Stent Intervention;  Surgeon: Peter M Martinique, MD;  Location: [facility];  Service: Cardiovascular;  Laterality: N/A;  . CAROTID ENDARTERECTOMY  Left   1998  . CHOLECYSTECTOMY    . CORONARY STENT PLACEMENT  10/09/2015   DES to RCA  . ESOPHAGOGASTRODUODENOSCOPY (EGD) WITH PROPOFOL Left 03/26/2017   Procedure: ESOPHAGOGASTRODUODENOSCOPY (EGD) WITH PROPOFOL;  Surgeon: Arta Silence, MD;  Location: [facility];  Service: Endoscopy;  Laterality: Left;  . LEFT HEART CATHETERIZATION WITH CORONARY ANGIOGRAM N/A 09/15/2013   Procedure: LEFT HEART CATHETERIZATION WITH CORONARY ANGIOGRAM;  Surgeon: Peter M Martinique, MD;  Location: [facility] CATH LAB;  Service:  Cardiovascular;  Laterality: N/A;  . PROSTATECTOMY  1998   radical for prostate cancer    Allergies  Allergen Reactions  . Ciprofloxacin Other (See Comments)    Hallucinations or jittery Hallucinations or jittery  . Codeine Other (See Comments)    hallucinations hallucinations  . Flexeril [Cyclobenzaprine]   . Methocarbamol   . Sulfa Antibiotics Other (See Comments)    Chills and shaking "serum sickness" Chills and shaking "serum sickness"  . Sulfamethoxazole Other (See Comments)    UNKNOWN REACTION, NOT DOCUMENTED  . Sulfonamide Derivatives Other (See Comments)    Chills and shaking "serum sickness"  . Flagyl [Metronidazole] Rash    Current Outpatient Medications  Medication Sig Dispense Refill  . albuterol (VENTOLIN HFA) 108 (90 Base) MCG/ACT inhaler Inhale 2 puffs into the lungs every 6 (six) hours as needed for wheezing or shortness of breath. 18 g 5  . Artificial Tear Solution (BION TEARS OP) Place 1 drop into both eyes daily as needed (dry eyes).    . carvedilol (COREG) 6.25 MG tablet TAKE 1 TABLET BY MOUTH TWICE A DAY WITH A MEAL 180 tablet 3  . clobetasol (TEMOVATE) 0.05 % external solution APPLY TO SCALP DAILY AS NEEDED  3  . ezetimibe (ZETIA) 10 MG tablet Take 1 tablet (10 mg total) by mouth daily. 90 tablet 1  . HYDROcodone-acetaminophen (NORCO/VICODIN) 5-325 MG tablet Take 0.5-1 tablets by mouth 2 (two) times daily as needed (pain). 60 tablet 0  . hydrocortisone 2.5 % cream Apply 1 application topically 2 (two) times daily.    Marland Kitchen ipratropium (ATROVENT) 0.03 % nasal spray Place 2 sprays into the nose 4 (four) times daily. 30 mL 1  . levothyroxine (SYNTHROID, LEVOTHROID) 112 MCG tablet Take 1 tablet (112 mcg total) by mouth daily. 90 tablet 3  . liothyronine (CYTOMEL) 5 MCG tablet Take 1 tablet (5 mcg total) by mouth every morning. 90 tablet 3  . losartan-hydrochlorothiazide (HYZAAR) 100-12.5 MG tablet TAKE 1 TABLET BY MOUTH EVERY EVENING. 90 tablet 2  .  NEOMYCIN-POLYMYXIN-HYDROCORTISONE (CORTISPORIN) 1 % SOLN OTIC solution Place 3 drops into both ears every 8 (eight) hours. 10 mL 0  . nitroGLYCERIN (NITROLINGUAL) 0.4 MG/SPRAY spray Place 1 spray under the tongue every 5 (five) minutes x 3 doses as needed for chest pain. 12 g 12  . Omega-3 Fatty Acids (FISH OIL) 1200 MG CAPS Take 1 capsule (1,200 mg total) by mouth daily. 30 capsule 0  . polyethylene glycol (MIRALAX / GLYCOLAX) packet Take 17 g by mouth daily. Mix in 8 oz liquid and drink    . rivaroxaban (XARELTO) 20 MG TABS tablet Take 1 tablet (20 mg total) by mouth daily with supper. 30 tablet 6  . triamcinolone  cream (KENALOG) 0.1 % Apply 1 application topically daily as needed (rash).     No current facility-administered medications for this visit.     Review of Systems : See HPI for pertinent positives and negatives.  Physical Examination  Vitals:   10/28/17 1250 10/28/17 1252  BP: (!) 154/76 (!) 153/79  Pulse: 69 70  Resp: 18   Temp: (!) 97.1 F (36.2 C)   TempSrc: Oral   SpO2: 97%   Weight: 217 lb (98.4 kg)   Height: 5\' 6"  (1.676 m)    Body mass index is 35.02 kg/m.  General:WDWN obese male in NAD GAIT: stiff HENT: no gross abnormalities  Eyes: PERRLA Pulmonary:  Respirations are non-labored, good air movement, CTAB Cardiac: regular rhythm, no detected murmur.  VASCULAR EXAM Carotid Bruits Right Left   Negative Negative     Abdominal aortic pulse is not palpable. Radial pulses are 2+ palpable and equal.                                                                                                                                          LE Pulses Right Left       POPLITEAL  not palpable   not palpable       POSTERIOR TIBIAL   palpable    palpable        DORSALIS PEDIS      ANTERIOR TIBIAL not palpable  Not palpable    Gastrointestinal: soft, nontender, BS WNL, no r/g, no palpable masses. Large abdomen.  Musculoskeletal: No muscle  atrophy/wasting. M/S 5/5 throughout, extremities without ischemic changes. Skin: No rashes, no ulcers, no cellulitis. 1+ pitting and non pitting edema in both feet and ankles.   Neurologic:  A&O X 3; appropriate affect, sensation is normal; speech is normal, CN 2-12 intact except is mildly hard of hearing,, pain and light touch intact in extremities, motor exam as listed above. Psychiatric: Normal thought content, mood appropriate to clinical situation.    Assessment: SAMUELL KNOBLE is a 84 y.o. male who  is status post left carotid endarterectomy in 1998. He had two preoperative TIA's, both with amaurosis fugax in the left eye. Pt denies any known subsequent TIA or stroke.   His atherosclerotic risk factors include 35 year hx of smoking (quit in 1987), CAD, relatively sedentary lifestyle, and obesity.   He takes Xarelto for episode of atrial fib.  We discussed ways that he could become more physically active and not be too painful to his knees or shoulders, e.g, stationary bike, senior water aerobics.   Pedal pulses are not palpable, but he has 1+ pitting and non pitting edema in both ankles and feet; he had had normal ABI's in September 2017 done at a facility elsewhere.    DATA Carotid Duplex (10/28/17): Right ICA: 60-79% stenosis; very high bifurcation, unable to visualize distal ICA due to depth of bifucation.  Left ICA: 1-39% stenosis  Right vertebral artery flow is retrograde, left is antegrade.  Technically difficult exam due to body habitus and high bifurcations.   Increased stenosis in the right ICA compared to the exam on 10-21-16.    Plan: Follow-up in 6 months with Carotid Duplex scan.    I discussed in depth with the patient the nature of atherosclerosis, and emphasized the importance of maximal medical management including strict control of blood pressure, blood glucose, and lipid levels, obtaining regular exercise, and continued cessation of smoking.  The patient is aware  that without maximal medical management the underlying atherosclerotic disease process will progress, limiting the benefit of any interventions. The patient was given information about stroke prevention and what symptoms should prompt the patient to seek immediate medical care. Thank you for allowing [place] to participate in this patient's care.  Clemon Chambers, RN, MSN, FNP-C [facility] Office: [phone]  Clinic Physician: Oneida Alar  10/28/17 12:53 PM

## 2017-11-22 ENCOUNTER — Other Ambulatory Visit: Payer: Self-pay

## 2017-11-22 DIAGNOSIS — I6523 Occlusion and stenosis of bilateral carotid arteries: Secondary | ICD-10-CM

## 2017-12-01 ENCOUNTER — Other Ambulatory Visit: Payer: Self-pay | Admitting: Cardiology

## 2017-12-01 NOTE — Telephone Encounter (Signed)
CrCl < 50mg /ml  Xarelto dose decreased to 15mg  daily. Left message for patient with updated information.

## 2017-12-03 ENCOUNTER — Telehealth: Payer: Self-pay | Admitting: Cardiology

## 2017-12-03 NOTE — Telephone Encounter (Signed)
New message    Pt c/o medication issue:  1. Name of Medication: xarelto  2. How are you currently taking this medication (dosage and times per day)? 20 mg  3. Are you having a reaction (difficulty breathing--STAT)? no  4. What is your medication issue? Pt would like to know why his mg changed to 15 mg.   Please call.

## 2017-12-03 NOTE — Telephone Encounter (Signed)
Returned call to patient no answer.LMTC. 

## 2017-12-07 NOTE — Telephone Encounter (Signed)
Left message for pt to call.

## 2017-12-08 NOTE — Telephone Encounter (Signed)
Left a message to call back. Third attempt.

## 2018-01-21 NOTE — Progress Notes (Addendum)
[facility] [address], [place], [name] [address] [phone]  Date:  01/24/2018   Name:  [name]   DOB:  28-Apr-1934   MRN:  [id number]  PCP:  [name], MD    Chief Complaint: Hypertension (follow up appt, multiple health concerns); Coronary Artery Disease; Asthma; Hypothyroidism; and Prostate Cancer   History of Present Illness:  [name] is a 84 y.o. very pleasant male patient who presents with the following:  Following up today Last seen by myself in March for a routine visit and then April when he coughed and developed a bruise on his abdomen Here today to discuss his general health History of HTN, CAD, asthma, CKD, a fib, hypothyroidism, OSA, prostate cancer  His urologist is Dr. Truman Hayward with [facility]: Peter Martinique. Per most recent note in January 1. Paroxysmal atrial fibrillation: CHA2DS2-VASC score 7 (age, TIA, HTN, CAD, PAD), maintaining sinus rhythm on carvedilol. Continue Xarelto. He does have some bruising but no recurrent GI bleed. 2. CAD: Aspirin has been discontinued, I will leave it off for now given the need for Xarelto.  He does have chronic stable angina class 2-3, this has not changed recently. Continue antianginal therapy. 3. Hypertension: Blood pressure well controlled 4. Hyperlipidemia: Continue on Zetia, recent lipids are satisfactory. 5. Carotid artery disease: Followed by Dr. Scot Dock 6. History of TIA: No residual deficit  Lab Results  Component Value Date   TSH 1.05 09/27/2017   His nephrologist is Dr. Joelyn Oms with [facility] - however he has not needed to follow-up and just saw him one time per his report, note from 1/19.  On review it does look like he was supposed to follow-up in 6 months actually   He would like to have his ears checked to see if he has any wax  He does see audiology and they are helping with his hearing aids.    However he still has a very  hard time with background nose and deciphering voices  Vinay notes that he tends to feel really sleepy and will take a lot of naps. This has been getting progressively worse.  He does not tend to sleep well at night.  He has been told that he had OSA but does not use any CPAP equipment as he could not tolerate it  He reports that he will be up and down all day and night- he may sleep for a couple of hours, get up for a couple of hours, and then go back to sleep  His memory has been worse for the last couple of years, but has recently noted a couple of distinct concerns- he forgot a computer password that he has used for years, forgot to watch the tour de [place] which he really looks forward to every year  He has noted some difficulty with his strength, has more difficulty getting out of a chair, etc. Wonders if going to silver sneaker at his nearby [employer] would be a god idea   Also, he has noted rash/ allergy type bumps on his upper trunk, neck for a couple of years, but worse the last year or so  We have used triamcinolone for him in th past but he found it difficult to use   Offered shingrix today but he prefers not to have this- he responded strongly to zostavax in the past   He has a written list of 15 concerns  today which we addressed as best as we could.  Basically he wants to check his thyroid and kidney function which is certainly ok, would like eval of his memory and sleep issues- referral to neurology  Patient Active Problem List   Diagnosis Date Noted  . Ecchymosis on examination 10/09/2017  . Ventral hernia without obstruction or gangrene 10/09/2017  . TIA (transient ischemic attack)   . Kidney stone   . Hypertension   . Hearing aid worn   . Coronary artery disease   . Asthma due to seasonal allergies   . Arthritis   . Erosive gastropathy 03/26/2017  . Acute blood loss anemia 03/25/2017  . Melena 03/24/2017  . CKD (chronic kidney disease), stage III ([place]) 03/24/2017  . AF  (paroxysmal atrial fibrillation) ([place]) 03/24/2017  . Angina pectoris ([place]) 10/09/2015  . Essential hypertension   . Hyperlipemia   . Carotid artery occlusion   . Dizziness 08/09/2015  . Gait instability 05/30/2015  . Hypothyroidism, acquired 10/26/2013  . OSA (obstructive sleep apnea) 10/18/2013  . Benign localized hyperplasia of prostate with urinary obstruction and other lower urinary tract symptoms (LUTS)(600.21) 12/21/2012  . Carcinoma in situ of prostate 12/21/2012  . Left shoulder pain 08/08/2012  . Obesity 07/24/2011  . Generalized anxiety disorder 07/24/2011  . DIVERTICULOSIS OF COLON 12/25/2009  . DERMATITIS, SEBORRHEIC 12/25/2009  . 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 Memory loss 08/14/2009  . VENOUS INSUFFICIENCY, CHRONIC 09/09/2006  . GASTROESOPHAGEAL REFLUX, NO ESOPHAGITIS 09/09/2006  . HERNIA, HIATAL, NONCONGENITAL 09/09/2006  . INSOMNIA NOS 09/09/2006    Past Medical History:  Diagnosis Date  . Anginal pain ([place])   . Anxiety    " OCCASIONAL"  . Arthritis   . Asthma due to seasonal allergies    uses inhalers prn  . Carotid artery occlusion    Left  . Complication of anesthesia    " had tremors after prostate surgery  . Coronary artery disease   . Diverticulitis    recurrent  . Family history of anesthesia complication    MOTHER   . GERD (gastroesophageal reflux disease)   . H/O hiatal hernia   . Hearing aid worn   . Hyperlipemia   . Hypertension   . Kidney stone    lithotripsy 3235 w complications, req stents, Dr Rosana Hoes  . Prostate CA ([place])   . Shortness of breath   . Thyroid disease   . TIA (transient ischemic attack)   . Tinnitus     Past Surgical History:  Procedure Laterality Date  . CARDIAC CATHETERIZATION  2008   minimal dz, Dr Cathie Olden  . CARDIAC CATHETERIZATION N/A 10/09/2015   Procedure: Left Heart Cath and Coronary Angiography;  Surgeon: Peter M Martinique, MD;  Location: [facility];  Service: Cardiovascular;  Laterality: N/A;   . CARDIAC CATHETERIZATION N/A 10/09/2015   Procedure: Intravascular Pressure Wire/FFR Study;  Surgeon: Peter M Martinique, MD;  Location: [facility];  Service: Cardiovascular;  Laterality: N/A;  . CARDIAC CATHETERIZATION N/A 10/09/2015   Procedure: Coronary Stent Intervention;  Surgeon: Peter M Martinique, MD;  Location: [facility];  Service: Cardiovascular;  Laterality: N/A;  . CAROTID ENDARTERECTOMY  Left   1998  . CHOLECYSTECTOMY    . CORONARY STENT PLACEMENT  10/09/2015   DES to RCA  . ESOPHAGOGASTRODUODENOSCOPY (EGD) WITH PROPOFOL Left 03/26/2017   Procedure: ESOPHAGOGASTRODUODENOSCOPY (EGD) WITH PROPOFOL;  Surgeon: Arta Silence, MD;  Location: [facility];  Service: Endoscopy;  Laterality: Left;  . LEFT  HEART CATHETERIZATION WITH CORONARY ANGIOGRAM N/A 09/15/2013   Procedure: LEFT HEART CATHETERIZATION WITH CORONARY ANGIOGRAM;  Surgeon: Peter M Martinique, MD;  Location: [facility];  Service: Cardiovascular;  Laterality: N/A;  . PROSTATECTOMY  1998   radical for prostate cancer    Social History   Tobacco Use  . Smoking status: Former Smoker    Packs/day: 2.50    Years: 30.00    Pack years: 75.00    Types: Cigarettes    Last attempt to quit: 08/28/1985    Years since quitting: 32.4  . Smokeless tobacco: Never Used  Substance Use Topics  . Alcohol use: Yes    Alcohol/week: 1.2 oz    Types: 2 Standard drinks or equivalent per week    Comment: 1 glass wine/week (prior 1 glass martini with dinner)  . Drug use: No    Family History  Problem Relation Age of Onset  . Heart disease Mother   . Heart disease Father   . Kidney failure Father   . Heart disease Sister   . Heart attack Sister   . Dementia Sister   . Sjogren's syndrome Child   . Hashimoto's thyroiditis Child   . CAD Child     Allergies  Allergen Reactions  . Ciprofloxacin Other (See Comments)    Hallucinations or jittery Hallucinations or jittery  . Codeine Other (See Comments)     hallucinations hallucinations  . Flexeril [Cyclobenzaprine]   . Methocarbamol   . Sulfa Antibiotics Other (See Comments)    Chills and shaking "serum sickness" Chills and shaking "serum sickness"  . Sulfamethoxazole Other (See Comments)    UNKNOWN REACTION, NOT DOCUMENTED  . Sulfonamide Derivatives Other (See Comments)    Chills and shaking "serum sickness"  . Flagyl [Metronidazole] Rash    Medication list has been reviewed and updated.  Current Outpatient Medications on File Prior to Visit  Medication Sig Dispense Refill  . albuterol (VENTOLIN HFA) 108 (90 Base) MCG/ACT inhaler Inhale 2 puffs into the lungs every 6 (six) hours as needed for wheezing or shortness of breath. 18 g 5  . Artificial Tear Solution (BION TEARS OP) Place 1 drop into both eyes daily as needed (dry eyes).    . carvedilol (COREG) 6.25 MG tablet TAKE 1 TABLET BY MOUTH TWICE A DAY WITH A MEAL 180 tablet 3  . clobetasol (TEMOVATE) 0.05 % external solution APPLY TO SCALP DAILY AS NEEDED  3  . ezetimibe (ZETIA) 10 MG tablet Take 1 tablet (10 mg total) by mouth daily. 90 tablet 1  . HYDROcodone-acetaminophen (NORCO/VICODIN) 5-325 MG tablet Take 0.5-1 tablets by mouth 2 (two) times daily as needed (pain). 60 tablet 0  . hydrocortisone 2.5 % cream Apply 1 application topically 2 (two) times daily.    Marland Kitchen ipratropium (ATROVENT) 0.03 % nasal spray Place 2 sprays into the nose 4 (four) times daily. 30 mL 1  . levothyroxine (SYNTHROID, LEVOTHROID) 112 MCG tablet Take 1 tablet (112 mcg total) by mouth daily. 90 tablet 3  . liothyronine (CYTOMEL) 5 MCG tablet Take 1 tablet (5 mcg total) by mouth every morning. 90 tablet 3  . losartan-hydrochlorothiazide (HYZAAR) 100-12.5 MG tablet TAKE 1 TABLET BY MOUTH EVERY EVENING. 90 tablet 2  . NEOMYCIN-POLYMYXIN-HYDROCORTISONE (CORTISPORIN) 1 % SOLN OTIC solution Place 3 drops into both ears every 8 (eight) hours. 10 mL 0  . nitroGLYCERIN (NITROLINGUAL) 0.4 MG/SPRAY spray Place 1 spray  under the tongue every 5 (five) minutes x 3 doses as needed for chest pain.  12 g 12  . Omega-3 Fatty Acids (FISH OIL) 1200 MG CAPS Take 1 capsule (1,200 mg total) by mouth daily. 30 capsule 0  . polyethylene glycol (MIRALAX / GLYCOLAX) packet Take 17 g by mouth daily. Mix in 8 oz liquid and drink    . Rivaroxaban (XARELTO) 15 MG TABS tablet Take 1 tablet (15 mg total) by mouth daily with supper. Note dose change. 90 tablet 0  . triamcinolone cream (KENALOG) 0.1 % Apply 1 application topically daily as needed (rash).     No current facility-administered medications on file prior to visit.     Review of Systems:  As per HPI- otherwise negative. No fever or chills Accompanied by his wife Deloris today who contributes to the history   Physical Examination: Vitals:   01/24/18 1056  BP: 128/70  Pulse: 63  Resp: 18  SpO2: 98%   Vitals:   01/24/18 1056  Weight: 225 lb (102.1 kg)  Height: 5\' 6"  (1.676 m)   Body mass index is 36.32 kg/m. Ideal Body Weight: Weight in (lb) to have BMI = 25: 154.6  GEN: WDWN, NAD, Non-toxic, A & O x 3, obese, looks well  HEENT: Atraumatic, Normocephalic. Neck supple. No masses, No LAD.  Bilateral TM wnl, oropharynx normal.  PEERL,EOMI.   Soft wax in both ears.  Was able to clear right with curette, left cleared with irrigation Ears and Nose: No external deformity. CV: RRR, No M/G/R. No JVD. No thrill. No extra heart sounds. PULM: CTA B, no wheezes, crackles, rhonchi. No retractions. No resp. distress. No accessory muscle use. EXTR: No c/c/e NEURO Normal gait.  PSYCH: Normally interactive. Conversant. Not depressed or anxious appearing.  Calm demeanor.    Assessment and Plan: Sleep disturbance - Plan: Ambulatory referral to Neurology  Memory loss - Plan: Ambulatory referral to Neurology  Acquired hypothyroidism - Plan: TSH  Essential hypertension - Plan: CBC, Comprehensive metabolic panel  CKD (chronic kidney disease) stage 3, GFR 30-59 ml/min  (HCC) - Plan: Comprehensive metabolic panel  Bilateral hearing loss, unspecified hearing loss type  Bilateral impacted cerumen  Referral to neurology Labs pending as above Will need to remind pt that he is actually supposed to follow-up with nephrology with his labs  Removed ear wax as above Signed Lamar Blinks, MD  Received his labs 7/16  Results for orders placed or performed in visit on 01/24/18  CBC  Result Value Ref Range   WBC 6.0 4.0 - 10.5 K/uL   RBC 3.81 (L) 4.22 - 5.81 Mil/uL   Platelets 147.0 (L) 150.0 - 400.0 K/uL   Hemoglobin 14.0 13.0 - 17.0 g/dL   HCT 39.8 39.0 - 52.0 %   MCV 104.3 (H) 78.0 - 100.0 fl   MCHC 35.2 30.0 - 36.0 g/dL   RDW 14.3 11.5 - 15.5 %  Comprehensive metabolic panel  Result Value Ref Range   Sodium 139 135 - 145 mEq/L   Potassium 4.5 3.5 - 5.1 mEq/L   Chloride 103 96 - 112 mEq/L   CO2 28 19 - 32 mEq/L   Glucose, Bld 117 (H) 70 - 99 mg/dL   BUN 28 (H) 6 - 23 mg/dL   Creatinine, Ser 2.01 (H) 0.40 - 1.50 mg/dL   Total Bilirubin 1.6 (H) 0.2 - 1.2 mg/dL   Alkaline Phosphatase 53 39 - 117 U/L   AST 18 0 - 37 U/L   ALT 25 0 - 53 U/L   Total Protein 6.8 6.0 - 8.3 g/dL  Albumin 4.1 3.5 - 5.2 g/dL   Calcium 9.2 8.4 - 10.5 mg/dL   GFR 33.93 (L) >60.00 mL/min  TSH  Result Value Ref Range   TSH 4.77 (H) 0.35 - 4.50 uIU/mL   Blood counts are normal except your MCV (average red cell size) is up- this could possibly indicate a vitamin B12 or folate deficiency.  We also see this in people who drink a fair amount of alcohol-  However I think you generally only have 2-3 drinks per week.  Please let me know if this is incorrect!   Metabolic profile is normal except for your kidney function.  I went back and looked at your most recnet nephrology note and I do think Dr. Joelyn Oms wanted to see you in 6 months, which would be August.  If you would call them at [phone] and schedule an appt at your convenience Your bilirubin is high- this has been  stable for years and should be harmless.   Your TSH is high- I would like to go up on your synthroid to 125 mcg.  We can recheck a TSH then in one month and do B12 and folate levels at the same time.   I will send in the 125 synthroid for you now.    Let me know if any questions!  Meds ordered this encounter  Medications  . levothyroxine (SYNTHROID, LEVOTHROID) 125 MCG tablet    Sig: Take 1 tablet (125 mcg total) by mouth daily.    Dispense:  30 tablet    Refill:  6

## 2018-01-24 ENCOUNTER — Encounter: Payer: Self-pay | Admitting: Family Medicine

## 2018-01-24 ENCOUNTER — Ambulatory Visit: Payer: Medicare Other | Admitting: Family Medicine

## 2018-01-24 VITALS — BP 128/70 | HR 63 | Resp 18 | Ht 66.0 in | Wt 225.0 lb

## 2018-01-24 DIAGNOSIS — E039 Hypothyroidism, unspecified: Secondary | ICD-10-CM | POA: Diagnosis not present

## 2018-01-24 DIAGNOSIS — D7589 Other specified diseases of blood and blood-forming organs: Secondary | ICD-10-CM | POA: Diagnosis not present

## 2018-01-24 DIAGNOSIS — H6123 Impacted cerumen, bilateral: Secondary | ICD-10-CM | POA: Diagnosis not present

## 2018-01-24 DIAGNOSIS — R413 Other amnesia: Secondary | ICD-10-CM

## 2018-01-24 DIAGNOSIS — G479 Sleep disorder, unspecified: Secondary | ICD-10-CM

## 2018-01-24 DIAGNOSIS — I10 Essential (primary) hypertension: Secondary | ICD-10-CM

## 2018-01-24 DIAGNOSIS — H9193 Unspecified hearing loss, bilateral: Secondary | ICD-10-CM

## 2018-01-24 DIAGNOSIS — N183 Chronic kidney disease, stage 3 unspecified: Secondary | ICD-10-CM

## 2018-01-24 LAB — COMPREHENSIVE METABOLIC PANEL
ALK PHOS: 53 U/L (ref 39–117)
ALT: 25 U/L (ref 0–53)
AST: 18 U/L (ref 0–37)
Albumin: 4.1 g/dL (ref 3.5–5.2)
BUN: 28 mg/dL — AB (ref 6–23)
CHLORIDE: 103 meq/L (ref 96–112)
CO2: 28 mEq/L (ref 19–32)
Calcium: 9.2 mg/dL (ref 8.4–10.5)
Creatinine, Ser: 2.01 mg/dL — ABNORMAL HIGH (ref 0.40–1.50)
GFR: 33.93 mL/min — ABNORMAL LOW (ref >60.00)
GLUCOSE: 117 mg/dL — AB (ref 70–99)
POTASSIUM: 4.5 meq/L (ref 3.5–5.1)
SODIUM: 139 meq/L (ref 135–145)
Total Bilirubin: 1.6 mg/dL — ABNORMAL HIGH (ref 0.2–1.2)
Total Protein: 6.8 g/dL (ref 6.0–8.3)

## 2018-01-24 LAB — CBC
HEMATOCRIT: 39.8 % (ref 39.0–52.0)
Hemoglobin: 14 g/dL (ref 13.0–17.0)
MCHC: 35.2 g/dL (ref 30.0–36.0)
MCV: 104.3 fl — AB (ref 78.0–100.0)
Platelets: 147 10*3/uL — ABNORMAL LOW (ref 150.0–400.0)
RBC: 3.81 Mil/uL — ABNORMAL LOW (ref 4.22–5.81)
RDW: 14.3 % (ref 11.5–15.5)
WBC: 6 10*3/uL (ref 4.0–10.5)

## 2018-01-24 LAB — TSH: TSH: 4.77 u[IU]/mL — ABNORMAL HIGH (ref 0.35–4.50)

## 2018-01-24 NOTE — Patient Instructions (Addendum)
It was good to see you today- take care and I will be in touch with your labs asap  We will check your thyroid and kidney function today for sure  I am going to refer you to neurology for evaluation of your sleep concerns and also your memory problems.   Going to [employer] to work out is a great idea which I would certainly encourage for you  Please mention your itching concerns to your new dermatologist- perhaps they will want to do a biopsy of your skin h

## 2018-01-25 ENCOUNTER — Encounter: Payer: Self-pay | Admitting: Family Medicine

## 2018-01-25 MED ORDER — LEVOTHYROXINE SODIUM 125 MCG PO TABS: 125.0000 ug | ORAL_TABLET | Freq: Every day | ORAL | 6 refills | Status: DC

## 2018-01-25 NOTE — Addendum Note (Signed)
Addended by: Lamar Blinks C on: 01/25/2018 01:00 PM   Modules accepted: Orders

## 2018-01-30 ENCOUNTER — Other Ambulatory Visit: Payer: Self-pay | Admitting: Family Medicine

## 2018-01-30 ENCOUNTER — Other Ambulatory Visit: Payer: Self-pay | Admitting: Cardiology

## 2018-02-05 ENCOUNTER — Encounter: Payer: Self-pay | Admitting: Family Medicine

## 2018-02-21 ENCOUNTER — Other Ambulatory Visit: Payer: Self-pay | Admitting: Cardiology

## 2018-03-21 ENCOUNTER — Encounter: Payer: Self-pay | Admitting: Neurology

## 2018-03-22 ENCOUNTER — Ambulatory Visit: Payer: Medicare Other | Admitting: Neurology

## 2018-03-22 ENCOUNTER — Encounter: Payer: Self-pay | Admitting: Neurology

## 2018-03-22 VITALS — BP 120/73 | HR 61 | Ht 66.0 in | Wt 227.0 lb

## 2018-03-22 DIAGNOSIS — G4719 Other hypersomnia: Secondary | ICD-10-CM

## 2018-03-22 DIAGNOSIS — Z9114 Patient's other noncompliance with medication regimen: Secondary | ICD-10-CM

## 2018-03-22 DIAGNOSIS — R351 Nocturia: Secondary | ICD-10-CM | POA: Diagnosis not present

## 2018-03-22 DIAGNOSIS — I201 Angina pectoris with documented spasm: Secondary | ICD-10-CM

## 2018-03-22 DIAGNOSIS — I480 Paroxysmal atrial fibrillation: Secondary | ICD-10-CM

## 2018-03-22 DIAGNOSIS — Z91199 Patient's noncompliance with other medical treatment and regimen due to unspecified reason: Secondary | ICD-10-CM

## 2018-03-22 DIAGNOSIS — G4701 Insomnia due to medical condition: Secondary | ICD-10-CM

## 2018-03-22 DIAGNOSIS — Z72821 Inadequate sleep hygiene: Secondary | ICD-10-CM | POA: Insufficient documentation

## 2018-03-22 DIAGNOSIS — G4733 Obstructive sleep apnea (adult) (pediatric): Secondary | ICD-10-CM

## 2018-03-22 NOTE — Patient Instructions (Signed)
Please remember to try to maintain good sleep hygiene, which means: Keep a regular sleep and wake schedule, try not to exercise or have a meal within 2 hours of your bedtime, try to keep your bedroom conducive for sleep, that is, cool and dark, without light distractors such as an illuminated alarm clock, and refrain from watching TV right before sleep or in the middle of the night and do not keep the TV or radio on during the night. Also, try not to use or play on electronic devices at bedtime, such as your cell phone, tablet PC or laptop. If you like to read at bedtime on an electronic device, try to dim the background light as much as possible. Do not eat in the middle of the night.   We will request a sleep study.    We will look for leg twitching and snoring or sleep apnea.   For chronic insomnia, you are best followed by a psychiatrist and/or sleep psychologist.   We will call you with the sleep study results and make a follow up appointment if needed.   Insomnia Insomnia is a sleep disorder that makes it difficult to fall asleep or to stay asleep. Insomnia can cause tiredness (fatigue), low energy, difficulty concentrating, mood swings, and poor performance at work or school. There are three different ways to classify insomnia:  Difficulty falling asleep.  Difficulty staying asleep.  Waking up too early in the morning.  Any type of insomnia can be long-term (chronic) or short-term (acute). Both are common. Short-term insomnia usually lasts for three months or less. Chronic insomnia occurs at least three times a week for longer than three months. What are the causes? Insomnia may be caused by another condition, situation, or substance, such as:  Anxiety.  Certain medicines.  Gastroesophageal reflux disease (GERD) or other gastrointestinal conditions.  Asthma or other breathing conditions.  Restless legs syndrome, sleep apnea, or other sleep disorders.  Chronic  pain.  Menopause. This may include hot flashes.  Stroke.  Abuse of alcohol, tobacco, or illegal drugs.  Depression.  Caffeine.  Neurological disorders, such as Alzheimer disease.  An overactive thyroid (hyperthyroidism).  The cause of insomnia may not be known. What increases the risk? Risk factors for insomnia include:  Gender. Women are more commonly affected than men.  Age. Insomnia is more common as you get older.  Stress. This may involve your professional or personal life.  Income. Insomnia is more common in people with lower income.  Lack of exercise.  Irregular work schedule or night shifts.  Traveling between different time zones.  What are the signs or symptoms? If you have insomnia, trouble falling asleep or trouble staying asleep is the main symptom. This may lead to other symptoms, such as:  Feeling fatigued.  Feeling nervous about going to sleep.  Not feeling rested in the morning.  Having trouble concentrating.  Feeling irritable, anxious, or depressed.  How is this treated? Treatment for insomnia depends on the cause. If your insomnia is caused by an underlying condition, treatment will focus on addressing the condition. Treatment may also include:  Medicines to help you sleep.  Counseling or therapy.  Lifestyle adjustments.  Follow these instructions at home:  Take medicines only as directed by your health care provider.  Keep regular sleeping and waking hours. Avoid naps.  Keep a sleep diary to help you and your health care provider figure out what could be causing your insomnia. Include: ? When you sleep. ?  When you wake up during the night. ? How well you sleep. ? How rested you feel the next day. ? Any side effects of medicines you are taking. ? What you eat and drink.  Make your bedroom a comfortable place where it is easy to fall asleep: ? Put up shades or special blackout curtains to block light from outside. ? Use a  white noise machine to block noise. ? Keep the temperature cool.  Exercise regularly as directed by your health care provider. Avoid exercising right before bedtime.  Use relaxation techniques to manage stress. Ask your health care provider to suggest some techniques that may work well for you. These may include: ? Breathing exercises. ? Routines to release muscle tension. ? Visualizing peaceful scenes.  Cut back on alcohol, caffeinated beverages, and cigarettes, especially close to bedtime. These can disrupt your sleep.  Do not overeat or eat spicy foods right before bedtime. This can lead to digestive discomfort that can make it hard for you to sleep.  Limit screen use before bedtime. This includes: ? Watching TV. ? Using your smartphone, tablet, and computer.  Stick to a routine. This can help you fall asleep faster. Try to do a quiet activity, brush your teeth, and go to bed at the same time each night.  Get out of bed if you are still awake after 15 minutes of trying to sleep. Keep the lights down, but try reading or doing a quiet activity. When you feel sleepy, go back to bed.  Make sure that you drive carefully. Avoid driving if you feel very sleepy.  Keep all follow-up appointments as directed by your health care provider. This is important. Contact a health care provider if:  You are tired throughout the day or have trouble in your daily routine due to sleepiness.  You continue to have sleep problems or your sleep problems get worse. Get help right away if:  You have serious thoughts about hurting yourself or someone else. This information is not intended to replace advice given to you by your health care provider. Make sure you discuss any questions you have with your health care provider. Document Released: 06/26/2000 Document Revised: 11/29/2015 Document Reviewed: 03/30/2014 Elsevier Interactive Patient Education  [employer].

## 2018-03-22 NOTE — Progress Notes (Addendum)
SLEEP MEDICINE CLINIC   Provider:  Larey King, [place] D  Primary Care Physician:  Zachary Mclean, MD   Referring Provider: [name], MD   Chief Complaint  Patient presents with  . New Patient (Initial Visit)    pt with wife, rm 11.pr very hard of hearing. Insomnia , OSA - sleep only to be addressed.   pt states he had a sleep study 4.5  years ago under dr King . he was started on  CPAP,  used it "for a little bit ", stopped -he wasnt seeing benefits and it caused chest pain .  The  pt is very confused on how the machine works. DME [facility]. he states he has problems with the mask leaking. he states he doesnt feel well rested even after using the machine. He is nearly deaf-     SLEEP CLINIC VISIT:   HPI:  [name] is a 84 y.o. male patient, seen here on 03-22-2018 with his wife, in a referral from Dr. Lorelei King for OSA, obstructive sleep apnea , re-evaluation. This pt would transfer his sleep apnea care.  I have the pleasure of meeting [name] today on 22 March 2018 , him as a new patient.  He has been an established obstructive sleep apnea patient and had been provided CPAP therapy for a long time- his sleep specialist is Dr. Annamaria King.  I was able to review his original sleep study or may be the last of several sleep studies from 27 Nov 2013. He was seen at the [facility] sleep center, Dr. Annamaria King was his primary sleep doctor there.  He underwent a split night protocol diagnosed with an AHI of 16.4/h, all obstructive in origin.  His REM AHI was much higher than the average apnea index at 37.1 a form of apnea that is usually not responsive to dental treatment and usually requires CPAP.   CPAP was initiated and titrated to 12 cmH2O pressure when AHI reached 0.0/h.   Snoring also was prevented at this pressure and oxygen saturation was at 89% SPO2.  He did have a lot of limb jerking during the titration.  Some of these caused arousals.-  He is now untreated as he  discontinued the CPAP machine use shortly after it was prescribed. Had "never used it 90 days "      Sleep habits are as follows: The couples dinnertime is usually between 5 and 6 PM yet there may be more food later as he has snacks.  [name] reports that he usually feels tired about 3 hours after a meal- he is streaming movies - and sleeps from 7-9 PM, is awake again and again ready to sleep -goes to the bedroom not earlier than midnight usually and may be asleep by 1 AM.   He can sleep for about 2 hours in a stretch. His wife describes snoring - and irregular pauses in breathing. The bedroom is cool, quiet and dark.  He stops of sleeping on his back but moves onto his sides at night, sleeping on one pillow- he is using a very soft top mattress. He will be up at 2.30 AM - and goes back to bed at 4 AM.  He wakes up due to nocturia. Bladder fills and he has discomfort- he urinates only 6-10 ounces each time. He is parched - and will finally rise at 8-9 AM. He watches sports in AM on TV or streaming. He watches the clock day and  night.  He sleeps 6-8 hours in daytime and another 4-6 h at night. He reports leaden fatigue- and his naps last 2-3 hour each time, never refreshed, no dreams.    Sleep medical history and family sleep history: 02-02-2018 visit with PCP captured this well: (PCP:  King, Zachary Filler, MD)  Hypertension (follow up appt, multiple health concerns); Coronary Artery Disease; Asthma; Hypothyroidism; and Prostate Cancer History of Present Illness:  TOBYN King is a 84 y.o. very pleasant male patient who presents with the following: Last seen by myself in March for a routine visit and then April when he coughed and developed a bruise on his abdomen Here today to discuss his general health History of HTN, CAD, asthma, CKD, a fib, hypothyroidism, OSA, prostate cancer.His urologist is Dr. Truman King with [facility]: [name]. Per most recent note in January 1. Paroxysmal atrial  fibrillation: CHA2DS2-VASC score 7 (age, TIA, HTN, CAD, PAD), maintaining sinus rhythm on carvedilol. Continue Xarelto. He does have some bruising but no recurrent GI bleed. 2. CAD: Aspirin has been discontinued, I will leave it off for now given the need for Xarelto.  He does have chronic stable angina class 2-3, this has not changed recently. Continue antianginal therapy. 3. Hypertension: Blood pressure well controlled 4. Hyperlipidemia: Continue on Zetia, recent lipids are satisfactory. 5. Carotid artery disease: Followed by Dr. Scot King 6. History of TIA: No residual deficit    Social history:   married, with adult children all out of state - 9  grandchildren. 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  [employer]. Long time smoker , quit in 1987- 3 years before he retired. Very little ETOH - used to drink a nightly martini before he retired. None since.  Caffeine- 1 cup a day- no sodas, no iced tea.    There is no bedtime routine and poor sleep habits, screen lights and clock in bedroom, sunlight in the morning , daytime naps- very long.  Review of Systems: Out of a complete 14 system review, the patient complains of only the following symptoms, and all other reviewed systems are negative.  CPAP air leakage is his main problems, and his lungs hurt " chest is sore " . denies Aerophagia. Hearing loss, depressed, fatigued. Poor sleep hygiene  Epworth score :13- 23/24, Fatigue severity score 53/ 63 , depression score 9/15 high !    Social History   Socioeconomic History  . Marital status: Married    Spouse name: Not on file  . Number of children: Not on file  . Years of education: Not on file  . Highest education level: Not on file  Occupational History  . Occupation: retired    Comment: Actor [employer]  Social Needs  . Financial resource strain: Not on file  . Food insecurity:    Worry: Not on file     Inability: Not on file  . Transportation needs:    Medical: Not on file    Non-medical: Not on file  Tobacco Use  . Smoking status: Former Smoker    Packs/day: 2.50    Years: 30.00    Pack years: 75.00    Types: Cigarettes    Last attempt to quit: 08/28/1985    Years since quitting: 32.5  . Smokeless tobacco: Never Used  Substance and Sexual Activity  . Alcohol use: Yes    Alcohol/week: 2.0 standard drinks    Types: 2 Standard drinks or equivalent  per week    Comment: 1 glass wine/week (prior 1 glass martini with dinner)  . Drug use: No  . Sexual activity: Yes    Birth control/protection: None  Lifestyle  . Physical activity:    Days per week: Not on file    Minutes per session: Not on file  . Stress: Not on file  Relationships  . Social connections:    Talks on phone: Not on file    Gets together: Not on file    Attends religious service: Not on file    Active member of club or organization: Not on file    Attends meetings of clubs or organizations: Not on file    Relationship status: Not on file  . Intimate partner violence:    Fear of current or ex partner: Not on file    Emotionally abused: Not on file    Physically abused: Not on file    Forced sexual activity: Not on file  Other Topics Concern  . Not on file  Social History Narrative   Lives with wife--recently diagnosed with breast ca; No smoking; Occassional wine; Retired; 2 daughters live in [place]--5 grandchildren(boys). On weight watchers. Lives in [place] with wife. Retired Solicitor. Tobacco history 2 ppd x 32 years. No smoking x 25 years. No drugs.    Family History  Problem Relation Age of Onset  . Heart disease Mother   . Heart disease Father   . Kidney failure Father   . Heart disease Sister   . Heart attack Sister   . Dementia Sister   . Sjogren's syndrome Child   . Hashimoto's thyroiditis Child   . CAD Child     Past Medical History:  Diagnosis Date  . Anginal pain ([place])     . Anxiety    " OCCASIONAL"  . Arthritis   . Asthma due to seasonal allergies    uses inhalers prn  . Carotid artery occlusion    Left  . Complication of anesthesia    " had tremors after prostate surgery  . Coronary artery disease   . Diverticulitis    recurrent  . Family history of anesthesia complication    MOTHER   . GERD (gastroesophageal reflux disease)   . H/O hiatal hernia   . Hearing aid worn   . Hyperlipemia   . Hypertension   . Kidney stone    lithotripsy 7001 w complications, req stents, Dr Rosana Hoes  . Prostate CA ([place])   . Shortness of breath   . Thyroid disease   . TIA (transient ischemic attack)   . Tinnitus     Past Surgical History:  Procedure Laterality Date  . CARDIAC CATHETERIZATION  2008   minimal dz, Dr Cathie Olden  . CARDIAC CATHETERIZATION N/A 10/09/2015   Procedure: Left Heart Cath and Coronary Angiography;  Surgeon: Zachary M Martinique, MD;  Location: [facility];  Service: Cardiovascular;  Laterality: N/A;  . CARDIAC CATHETERIZATION N/A 10/09/2015   Procedure: Intravascular Pressure Wire/FFR Study;  Surgeon: Zachary M Martinique, MD;  Location: [facility];  Service: Cardiovascular;  Laterality: N/A;  . CARDIAC CATHETERIZATION N/A 10/09/2015   Procedure: Coronary Stent Intervention;  Surgeon: Zachary M Martinique, MD;  Location: [facility];  Service: Cardiovascular;  Laterality: N/A;  . CAROTID ENDARTERECTOMY  Left   1998  . CHOLECYSTECTOMY    . CORONARY STENT PLACEMENT  10/09/2015   DES to RCA  . ESOPHAGOGASTRODUODENOSCOPY (EGD) WITH PROPOFOL Left 03/26/2017  Procedure: ESOPHAGOGASTRODUODENOSCOPY (EGD) WITH PROPOFOL;  Surgeon: Arta Silence, MD;  Location: [facility];  Service: Endoscopy;  Laterality: Left;  . LEFT HEART CATHETERIZATION WITH CORONARY ANGIOGRAM N/A 09/15/2013   Procedure: LEFT HEART CATHETERIZATION WITH CORONARY ANGIOGRAM;  Surgeon: Zachary M Martinique, MD;  Location: [facility];  Service: Cardiovascular;  Laterality: N/A;  .  PROSTATECTOMY  1998   radical for prostate cancer    Current Outpatient Medications  Medication Sig Dispense Refill  . albuterol (VENTOLIN HFA) 108 (90 Base) MCG/ACT inhaler Inhale 2 puffs into the lungs every 6 (six) hours as needed for wheezing or shortness of breath. 18 g 5  . Artificial Tear Solution (BION TEARS OP) Place 1 drop into both eyes daily as needed (dry eyes).    . carvedilol (COREG) 6.25 MG tablet TAKE 1 TABLET BY MOUTH TWICE A DAY WITH A MEAL 180 tablet 1  . clobetasol (TEMOVATE) 0.05 % external solution APPLY TO SCALP DAILY AS NEEDED  3  . ezetimibe (ZETIA) 10 MG tablet Take 1 tablet (10 mg total) by mouth daily. 90 tablet 1  . HYDROcodone-acetaminophen (NORCO/VICODIN) 5-325 MG tablet Take 0.5-1 tablets by mouth 2 (two) times daily as needed (pain). 60 tablet 0  . hydrocortisone 2.5 % cream Apply 1 application topically 2 (two) times daily.    Marland Kitchen ipratropium (ATROVENT) 0.03 % nasal spray Place 2 sprays into the nose 4 (four) times daily. 30 mL 1  . levothyroxine (SYNTHROID, LEVOTHROID) 125 MCG tablet Take 1 tablet (125 mcg total) by mouth daily. 30 tablet 6  . liothyronine (CYTOMEL) 5 MCG tablet Take 1 tablet (5 mcg total) by mouth every morning. 90 tablet 3  . losartan-hydrochlorothiazide (HYZAAR) 100-12.5 MG tablet Take 1 tablet by mouth daily. KEEP OV. 90 tablet 0  . NEOMYCIN-POLYMYXIN-HYDROCORTISONE (CORTISPORIN) 1 % SOLN OTIC solution Place 3 drops into both ears every 8 (eight) hours. 10 mL 0  . nitroGLYCERIN (NITROLINGUAL) 0.4 MG/SPRAY spray Place 1 spray under the tongue every 5 (five) minutes x 3 doses as needed for chest pain. 12 g 12  . Omega-3 Fatty Acids (FISH OIL) 1200 MG CAPS Take 1 capsule (1,200 mg total) by mouth daily. 30 capsule 0  . polyethylene glycol (MIRALAX / GLYCOLAX) packet Take 17 g by mouth daily. Mix in 8 oz liquid and drink    . triamcinolone cream (KENALOG) 0.1 % Apply 1 application topically daily as needed (rash).    Alveda Reasons 15 MG TABS tablet  TAKE 1 TABLET (15 MG TOTAL) BY MOUTH DAILY WITH SUPPER. NOTE DOSE CHANGE. 90 tablet 0   No current facility-administered medications for this visit.     Allergies as of 03/22/2018 - Review Complete 03/22/2018  Allergen Reaction Noted  . Ciprofloxacin Other (See Comments) 07/19/2014  . Codeine Other (See Comments) 07/19/2014  . Flexeril [cyclobenzaprine]  09/25/2015  . Methocarbamol  09/25/2015  . Sulfa antibiotics Other (See Comments) 11/05/2015  . Sulfamethoxazole Other (See Comments) 01/27/2012  . Sulfonamide derivatives Other (See Comments)   . Flagyl [metronidazole] Rash 08/09/2015    Vitals: BP 120/73   Pulse 61   Ht 5\' 6"  (1.676 m)   Wt 227 lb (103 kg)   BMI 36.64 kg/m  Last Weight:  Wt Readings from Last 1 Encounters:  03/22/18 227 lb (103 kg)   JQB:HALP mass index is 36.64 kg/m.     Last Height:   Ht Readings from Last 1 Encounters:  03/22/18 5\' 6"  (1.676 m)    Physical exam:  General:  The patient is awake, alert and appears not in acute distress. The patient is well groomed. Head: Normocephalic, atraumatic. Neck is supple. Mallampati 5 ,  neck circumference:16. 5 . Nasal airflow congested. Retrognathia is not seen.  Cardiovascular:  irregular rate and rhythm , a fib with right carotid bruit, and without distended neck veins. Respiratory: Lungs are clear to auscultation. Skin:  Without evidence of edema, or rash Trunk: BMI is 36.6 . The patient's posture is stooped    Neurologic exam : The patient is awake and alert, oriented to place and time.   Memory  MOCA: Montreal Cognitive Assessment  06/17/2015  Visuospatial/ Executive (0/5) 5  Naming (0/3) 3  Attention: Read list of digits (0/2) 2  Attention: Read list of letters (0/1) 1  Attention: Serial 7 subtraction starting at 100 (0/3) 3  Language: Repeat phrase (0/2) 2  Language : Fluency (0/1) 1  Abstraction (0/2) 2  Delayed Recall (0/5) 4  Orientation (0/6) 6  Total 29  Adjusted Score (based on  education) 29   MMSE:No flowsheet data found.  Attention span & concentration ability appears very limited - may be due to hearing loss. Speech is fluent,  without dysarthria, he is very loud . Mood and affect are depressed .Marland Kitchen  Cranial nerves: Pupils are equal and briskly reactive to light. Funduscopic exam without evidence of previous surgery, beginning cataract - . Extraocular movements  in vertical and horizontal planes intact and without nystagmus. Visual fields by finger perimetry are intact. Hearing severely impaired  Facial sensation intact to fine touch.  Facial motor strength is symmetric and tongue and uvula move midline. Shoulder shrug was symmetrical.   Motor exam: Normal tone, muscle bulk and symmetric strength in all extremities. Sensory: deferred today - this visit is getting very very long  Fine touch, pinprick and vibration were deferred Coordination: deferred  Gait and station: Patient walks with a cane as assistive device, stopped , small steps. Leaning on his cane .  Deep tendon reflexes: in the upper and lower extremities are symmetric and intact.   The patient stated that he is also also followed by [facility] Dr. Nyra Capes for CKD 3 - he is supposed to have a follow-up in 3 months from now. He does have progressive hearing loss in his hearing aids do not compensate well for him.  He reports again that he is sleepy- he knows he has obstructive sleep apnea but he could not tolerate his CPAP equipment , so stated.   He is depressed, feels worthless, gave his precious [employer] to his grandson-" I am of no use to anyone anymore " . Depression cause pseudodementia, and may contribute to  fatigue and sleepiness- which  as well have to be seen  in relation to coronary artery disease, hypertension, obesity with asthma, chronic kidney failure, hearing loss, atrial fibrillation, diastolic congestive heart failure, hypothyroidism, prostate cancer. He had strokes-  He  is followed for carotid artery disease by Dr. Rachelle Hora.   He was already told 15 years ago by Dr. Marvell Fuller that he had brain atrophy and microvascular disease.     Assessment:  After physical and neurologic examination, review of laboratory studies,  Personal review of imaging studies, reports of other /same  Imaging studies, results of polysomnography and / or neurophysiology testing and pre-existing records as far as provided in visit., my assessment is   1) Insomnia is aquired by poor habits - NO set bed time, no rise time and all  day naps, much too long each of them. He justifies all his transgression with " I am so fatigued, how could I not fall asleep ?"   2) I am sure he still has OSA- sleep apnea- his last study was over 4 years ago. This is the other cause of fatigue and sleepiness.  The patient was placed on a CPAP but he was never really feeling that it benefited him and he was given an [place] view mask and medium size.  The amount of you usually works better for patients with claustrophobia as it does not interfere with the visual fields, I think he needs to undergo desensitization I am not sure that he will be served with a nasal mask or nasal pillow, he does have a machine that I would consider up to date but it cannot be set to a AutoSet function.  The machine is for years and 56 months old.  He would be due to get a new one in in 9 months anyway.   In order to document that he still has sleep apnea I will order a home sleep test as confirmation for him.   I will then inquire if he can put him down for a PAP- nap to see which mask fit him best.    His current settings are 12 cmH2O pressure with 3 cm EPR and 20% compliance he made an concerted effort to use it over the last 3 months.  It seems that the machine would help him if he could tolerate it and that the settings are good for him.  The patient was advised of the nature of the diagnosed disorder , the treatment options and  the  risks for general health and wellness arising from not treating the condition.   I spent more than 60 minutes of face to face time with the patient.  Greater than 50% of time was spent in counseling and coordination of care. We have discussed the diagnosis and differential and I answered the patient's questions.    Plan:  Treatment plan and additional workup :  I would like for [name] to take a concerted effort to reduce his daytime naps to 45 minutes or less.  He can set an alarm, occasionally proper alarm whenever he has available and he should not nap more than once a day.   He has to start establishing a bedtime and leaving all electronics of 30 minutes before he retires to bed they should be no screen time inside the bedroom they should be no screen time right before bedtime. He received a hand out with Insomnia behavior changes -    I would encourage him to use an over-the-counter nutritional supplement, namely melatonin, to use at night it may help him to extend the interval periods of sleep.   He is should also be allowed to use a sleep aid should he have trouble initiating sleep.  However , his problem is to stay asleep.    1 of the key factors is nocturia which I cannot primarily treat- this is a result of prostate cancer and diuretics, his urologist may need to treat .  Urologist - Dr. Lawerance Bach.  Gi - Dr. Cristina Gong   RV after [place], MD 8/34/1962, 22:97 AM  Certified in Neurology by ABPN Certified in [place] by [facility] [address] [place], [place] [address]

## 2018-03-23 ENCOUNTER — Other Ambulatory Visit: Payer: Self-pay | Admitting: Neurology

## 2018-03-23 ENCOUNTER — Telehealth: Payer: Self-pay

## 2018-03-23 DIAGNOSIS — G4701 Insomnia due to medical condition: Secondary | ICD-10-CM

## 2018-03-23 DIAGNOSIS — I480 Paroxysmal atrial fibrillation: Secondary | ICD-10-CM

## 2018-03-23 DIAGNOSIS — Z72821 Inadequate sleep hygiene: Secondary | ICD-10-CM

## 2018-03-23 DIAGNOSIS — G4719 Other hypersomnia: Secondary | ICD-10-CM

## 2018-03-23 DIAGNOSIS — I201 Angina pectoris with documented spasm: Secondary | ICD-10-CM

## 2018-03-23 DIAGNOSIS — G4733 Obstructive sleep apnea (adult) (pediatric): Secondary | ICD-10-CM

## 2018-03-23 DIAGNOSIS — Z9114 Patient's other noncompliance with medication regimen: Secondary | ICD-10-CM

## 2018-03-23 DIAGNOSIS — R351 Nocturia: Secondary | ICD-10-CM

## 2018-03-23 NOTE — Telephone Encounter (Signed)
Please place I lab order with my diagnosis codes.

## 2018-03-23 NOTE — Telephone Encounter (Signed)
Insurance will allow a in lab sleep study. Can we get an order for this? Will cancel HST order.

## 2018-03-23 NOTE — Telephone Encounter (Signed)
Order placed

## 2018-04-07 ENCOUNTER — Other Ambulatory Visit: Payer: Self-pay | Admitting: Family Medicine

## 2018-04-07 DIAGNOSIS — J300 Vasomotor rhinitis: Secondary | ICD-10-CM

## 2018-04-13 ENCOUNTER — Ambulatory Visit (INDEPENDENT_AMBULATORY_CARE_PROVIDER_SITE_OTHER): Payer: Medicare Other | Admitting: Neurology

## 2018-04-13 ENCOUNTER — Other Ambulatory Visit: Payer: Self-pay | Admitting: Cardiology

## 2018-04-13 ENCOUNTER — Encounter: Payer: Self-pay | Admitting: Family Medicine

## 2018-04-13 DIAGNOSIS — G4733 Obstructive sleep apnea (adult) (pediatric): Secondary | ICD-10-CM

## 2018-04-13 DIAGNOSIS — I201 Angina pectoris with documented spasm: Secondary | ICD-10-CM

## 2018-04-13 DIAGNOSIS — R351 Nocturia: Secondary | ICD-10-CM

## 2018-04-13 DIAGNOSIS — G4701 Insomnia due to medical condition: Secondary | ICD-10-CM

## 2018-04-13 DIAGNOSIS — Z9114 Patient's other noncompliance with medication regimen: Secondary | ICD-10-CM

## 2018-04-13 DIAGNOSIS — I480 Paroxysmal atrial fibrillation: Secondary | ICD-10-CM

## 2018-04-13 DIAGNOSIS — Z72821 Inadequate sleep hygiene: Secondary | ICD-10-CM

## 2018-04-13 DIAGNOSIS — G4719 Other hypersomnia: Secondary | ICD-10-CM

## 2018-04-20 DIAGNOSIS — Z91199 Patient's noncompliance with other medical treatment and regimen due to unspecified reason: Secondary | ICD-10-CM | POA: Insufficient documentation

## 2018-04-20 DIAGNOSIS — Z9114 Patient's other noncompliance with medication regimen: Secondary | ICD-10-CM | POA: Insufficient documentation

## 2018-04-20 NOTE — Addendum Note (Signed)
Addended by: Larey Seat on: 04/20/2018 05:40 PM   Modules accepted: Orders

## 2018-04-20 NOTE — Procedures (Signed)
NAME:  [name]. [place]                                                                DOB: Aug 03, 1933 MEDICAL RECORD NUMBER [id number]                                           DOS:  04/13/2018 REFERRING PHYSICIAN: Lamar Blinks, MD STUDY PERFORMED: Home Sleep Study on watch pat HISTORY: [name] is a 84 y.o. male patient, seen on 03-22-2018 with his wife, in a referral from Dr. Lorelei Pont for re-evaluation of sleep apnea- transfer of his sleep apnea care, he is a new patient.  He has been an established obstructive sleep apnea patient of sleep specialist Dr. Annamaria Boots.  I was able to review his original sleep study or may be the last of several sleep studies from 27 Nov 2013. He was seen at the [facility] sleep center, Dr. Annamaria Boots.  He underwent a split night protocol, was diagnosed with an AHI of 16.4/h, all obstructive apnea.   His REM AHI was much higher than the average apnea index at 37.1/h- a form of apnea that is usually not responsive to dental treatment and requires CPAP.   CPAP was initiated and titrated to 12 cmH2O pressure when AHI reached 0.0/h.    He is now untreated as he discontinued the CPAP machine shortly after it was prescribed. Had "never used it 90 days ".  BMI: 36.5.  Epworth Sleepiness score: 23/24, Fatigue severity score: 53/ 63 points, Geriatric depression score endorsed at 9/15 points- very high !    STUDY RESULTS:  Total Recording Time: 10 hours 4 minutes; 7h 43 min.  Total Apnea/Hypopnea Index (AHI): 36.0 /h; RDI:  36.8 /h; REM AHI 40.1/h. Average Oxygen Saturation: 93%; Lowest Oxygen Saturation: 82 %.  Total Time Oxygen Saturation below 89 %:  22.2 minutes  Average Heart Rate:   65 bpm (between 51 and 88 bpm) IMPRESSION: Severe, all Obstructive Sleep Apnea. Hypoxemia accentuated in REM sleep.  RECOMMENDATION: CPAP therapy is recommended as the apnea degree is sever. If unable to tolerate consider referral for inspire procedure (patient needs to lose weight to qualify), or reduction in  AHI by use of a dental device.  I certify that I have reviewed the raw data recording prior to the issuance of this report in accordance with the standards of the American Academy of Sleep Medicine (AASM). Larey Seat, M.D. 04-20-2018       Medical Director of [facility] at [facility], accredited by the AASM. Diplomat of the ABPN and ABSM.

## 2018-04-21 ENCOUNTER — Telehealth: Payer: Self-pay | Admitting: Neurology

## 2018-04-21 ENCOUNTER — Other Ambulatory Visit: Payer: Medicare Other

## 2018-04-21 ENCOUNTER — Other Ambulatory Visit (INDEPENDENT_AMBULATORY_CARE_PROVIDER_SITE_OTHER): Payer: Medicare Other

## 2018-04-21 DIAGNOSIS — D7589 Other specified diseases of blood and blood-forming organs: Secondary | ICD-10-CM | POA: Diagnosis not present

## 2018-04-21 DIAGNOSIS — E039 Hypothyroidism, unspecified: Secondary | ICD-10-CM

## 2018-04-21 LAB — VITAMIN B12: Vitamin B-12: >1500 pg/mL — ABNORMAL HIGH (ref 211–911)

## 2018-04-21 LAB — TSH: TSH: 0.95 u[IU]/mL (ref 0.35–4.50)

## 2018-04-21 LAB — FOLATE

## 2018-04-21 NOTE — Telephone Encounter (Signed)
I called pt. I advised pt that Dr. Brett Fairy reviewed their sleep study results and found that severe sleep apnea and recommends that pt be treated with a cpap. Dr. Brett Fairy recommends that pt return for a repeat sleep study in order to properly titrate the cpap and ensure a good mask fit. Pt is agreeable to returning for a titration study. I advised pt that our sleep lab will file with pt's insurance and call pt to schedule the sleep study when we hear back from the pt's insurance regarding coverage of this sleep study. Pt verbalized understanding of results. Pt is very hard of hearing and states that he will have to schedule the test when his wife would be available to drive him. Pt has a CPAP that was set up in 2015 through [facility]. Pt had no questions at this time but was encouraged to call back if questions arise.

## 2018-04-21 NOTE — Telephone Encounter (Signed)
-----   Message from Larey Seat, MD sent at 04/20/2018  5:40 PM EDT ----- IMPRESSION: Severe, all Obstructive Sleep Apnea. Hypoxemia  accentuated in REM sleep.  RECOMMENDATION: CPAP therapy is recommended as the apnea degree  is severe. If unable to tolerate CPAP again ,consider referral for inspire  procedure (patient needs to lose weight to qualify), or accept incomplete  reduction  in AHI by use of a dental device.  I will order a CPAP titration- he needs desensitization first.

## 2018-04-22 ENCOUNTER — Encounter: Payer: Self-pay | Admitting: Family Medicine

## 2018-04-26 ENCOUNTER — Telehealth: Payer: Self-pay | Admitting: *Deleted

## 2018-04-26 NOTE — Telephone Encounter (Signed)
Copied from [place] [id number]. Topic: General - Inquiry >> Apr 21, 2018  3:29 PM Scherrie Gerlach wrote: Reason for CRM: wife states they spoke with the insurance company and there should not be any reason his card would not work. Please call her, as she said you would understand the issue. She says she was to call Kennyth Lose back

## 2018-04-26 NOTE — Telephone Encounter (Deleted)
Copied from [place] [phone]. Topic: General - Inquiry >> Apr 21, 2018  3:29 PM Scherrie Gerlach wrote: Reason for CRM: wife states they spoke with the insurance company and there should not be any reason his card would not work. Please call her, as she said you would understand the issue. She says she was to call Kennyth Lose back

## 2018-04-26 NOTE — Telephone Encounter (Signed)
Pt aware of message below

## 2018-04-26 NOTE — Telephone Encounter (Signed)
LVM to inform pt that insurance is active (verified with health insurance) and it was included with pt's appt. Done

## 2018-04-29 ENCOUNTER — Ambulatory Visit (HOSPITAL_COMMUNITY)
Admission: RE | Admit: 2018-04-29 | Discharge: 2018-04-29 | Disposition: A | Discharge status: Home / Self Care | Payer: Medicare Other | Source: Ambulatory Visit | Attending: Family | Admitting: Family

## 2018-04-29 ENCOUNTER — Ambulatory Visit (INDEPENDENT_AMBULATORY_CARE_PROVIDER_SITE_OTHER): Payer: Medicare Other | Admitting: Family

## 2018-04-29 ENCOUNTER — Encounter: Payer: Self-pay | Admitting: Family

## 2018-04-29 VITALS — BP 138/74 | HR 62 | Temp 97.0°F | Resp 16 | Ht 66.0 in | Wt 228.0 lb

## 2018-04-29 DIAGNOSIS — R0989 Other specified symptoms and signs involving the circulatory and respiratory systems: Secondary | ICD-10-CM | POA: Diagnosis not present

## 2018-04-29 DIAGNOSIS — Z9889 Other specified postprocedural states: Secondary | ICD-10-CM | POA: Diagnosis not present

## 2018-04-29 DIAGNOSIS — I6523 Occlusion and stenosis of bilateral carotid arteries: Secondary | ICD-10-CM

## 2018-04-29 DIAGNOSIS — L602 Onychogryphosis: Secondary | ICD-10-CM | POA: Diagnosis not present

## 2018-04-29 NOTE — Patient Instructions (Addendum)
To decrease swelling in your feet and legs: Elevate feet above slightly bent knees, feet above heart, overnight and 3-4 times per day for 20 minutes.  To measure for knee high compression hose: Measure the length of calf (from the crease of the knee to the bottom of the heel), largest circumference of calf, and ankle circumference first thing in the morning before your legs have a chance to swell.  Take these 3 measurements with you to obtain 20-30 or 15-20 mm mercury graduated knee high compression hose.  Put the stockings on in the morning, remove at bedtime.     Stroke Prevention Some health problems and behaviors may make it more likely for you to have a stroke. Below are ways to lessen your risk of having a stroke.  Be active for at least 30 minutes on most or all days.  Do not smoke. Try not to be around others who smoke.  Do not drink too much alcohol. ? Do not have more than 2 drinks a day if you are a man. ? Do not have more than 1 drink a day if you are a woman and are not pregnant.  Eat healthy foods, such as fruits and vegetables. If you were put on a specific diet, follow the diet as told.  Keep your cholesterol levels under control through diet and medicines. Look for foods that are low in saturated fat, trans fat, cholesterol, and are high in fiber.  If you have diabetes, follow all diet plans and take your medicine as told.  Ask your doctor if you need treatment to lower your blood pressure. If you have high blood pressure (hypertension), follow all diet plans and take your medicine as told by your doctor.  If you are 62-76 years old, have your blood pressure checked every 3-5 years. If you are age 40 or older, have your blood pressure checked every year.  Keep a healthy weight. Eat foods that are low in calories, salt, saturated fat, trans fat, and cholesterol.  Do not take drugs.  Avoid birth control pills, if this applies. Talk to your doctor about the risks  of taking birth control pills.  Talk to your doctor if you have sleep problems (sleep apnea).  Take all medicine as told by your doctor. ? You may be told to take aspirin or blood thinner medicine. Take this medicine as told by your doctor. ? Understand your medicine instructions.  Make sure any other conditions you have are being taken care of.  Get help right away if:  You suddenly lose feeling (you feel numb) or have weakness in your face, arm, or leg.  Your face or eyelid hangs down to one side.  You suddenly feel confused.  You have trouble talking (aphasia) or understanding what people are saying.  You suddenly have trouble seeing in one or both eyes.  You suddenly have trouble walking.  You are dizzy.  You lose your balance or your movements are clumsy (uncoordinated).  You suddenly have a very bad headache and you do not know the cause.  You have new chest pain.  Your heart feels like it is fluttering or skipping a beat (irregular heartbeat). Do not wait to see if the symptoms above go away. Get help right away. Call your local emergency services (911 in [place] Do not drive yourself to the hospital. This information is not intended to replace advice given to you by your health care provider. Make sure you discuss any questions  you have with your health care provider. Document Released: 12/29/2011 Document Revised: 12/05/2015 Document Reviewed: 12/30/2012 Elsevier Interactive Patient Education  [employer].

## 2018-04-29 NOTE — Progress Notes (Signed)
Chief Complaint: Follow up Extracranial Carotid Artery Stenosis   History of Present Illness  [name] is a 84 y.o. male who is status post left carotid endarterectomy in 1998 by Dr. Amedeo Plenty, assisted by Dr. Scot Dock.  He had two preoperative TIA's, bothwith amaurosis fugax in the left eye. Pt denies any known subsequent TIA or stroke.  Dr. Scot Dock evaluated ptin February, 2017with a 60-79% right carotid stenosis. As this was asymptomatic, Dr. Dicksonrecommended follow up carotid duplex in 6 months. The patient was on aspirin and was also on a statin.  He has undergone PCTA in March 2017and has done well from that standpoint.   He does not seem to have claudication symptoms with walking. His wife states he is dyspneic with climbing stairs. He does havea stationary bike but does not use it.  He also seems to have arthritic pain.  Diabetic:no Tobacco SEG:BTDVVO smoker, quitin 1987, smoked 2-3 ppd, started at age 10  Pt meds include: Statin :no, may have had arthralgias with statin ASA:no Other anticoagulants/antiplatelets:Xarelto, pt states he had an episode of atrial fib    Past Medical History:  Diagnosis Date  . Anginal pain ([place])   . Anxiety    " OCCASIONAL"  . Arthritis   . Asthma due to seasonal allergies    uses inhalers prn  . Carotid artery occlusion    Left  . Complication of anesthesia    " had tremors after prostate surgery  . Coronary artery disease   . Diverticulitis    recurrent  . Family history of anesthesia complication    MOTHER   . GERD (gastroesophageal reflux disease)   . H/O hiatal hernia   . Hearing aid worn   . Hyperlipemia   . Hypertension   . Kidney stone    lithotripsy 1607 w complications, req stents, Dr Rosana Hoes  . Prostate CA ([place])   . Shortness of breath   . Thyroid disease   . TIA (transient ischemic attack)   . Tinnitus     Social History Social History   Tobacco Use  . Smoking status: Former Smoker     Packs/day: 2.50    Years: 30.00    Pack years: 75.00    Types: Cigarettes    Last attempt to quit: 08/28/1985    Years since quitting: 32.6  . Smokeless tobacco: Never Used  Substance Use Topics  . Alcohol use: Yes    Alcohol/week: 2.0 standard drinks    Types: 2 Standard drinks or equivalent per week    Comment: 1 glass wine/week (prior 1 glass martini with dinner)  . Drug use: No    Family History Family History  Problem Relation Age of Onset  . Heart disease Mother   . Heart disease Father   . Kidney failure Father   . Heart disease Sister   . Heart attack Sister   . Dementia Sister   . Sjogren's syndrome Child   . Hashimoto's thyroiditis Child   . CAD Child     Surgical History Past Surgical History:  Procedure Laterality Date  . CARDIAC CATHETERIZATION  2008   minimal dz, Dr Cathie Olden  . CARDIAC CATHETERIZATION N/A 10/09/2015   Procedure: Left Heart Cath and Coronary Angiography;  Surgeon: Peter M Martinique, MD;  Location: [facility];  Service: Cardiovascular;  Laterality: N/A;  . CARDIAC CATHETERIZATION N/A 10/09/2015   Procedure: Intravascular Pressure Wire/FFR Study;  Surgeon: Peter M Martinique, MD;  Location: [facility];  Service:  Cardiovascular;  Laterality: N/A;  . CARDIAC CATHETERIZATION N/A 10/09/2015   Procedure: Coronary Stent Intervention;  Surgeon: Peter M Martinique, MD;  Location: [facility];  Service: Cardiovascular;  Laterality: N/A;  . CAROTID ENDARTERECTOMY  Left   1998  . CHOLECYSTECTOMY    . CORONARY STENT PLACEMENT  10/09/2015   DES to RCA  . ESOPHAGOGASTRODUODENOSCOPY (EGD) WITH PROPOFOL Left 03/26/2017   Procedure: ESOPHAGOGASTRODUODENOSCOPY (EGD) WITH PROPOFOL;  Surgeon: Arta Silence, MD;  Location: [facility];  Service: Endoscopy;  Laterality: Left;  . LEFT HEART CATHETERIZATION WITH CORONARY ANGIOGRAM N/A 09/15/2013   Procedure: LEFT HEART CATHETERIZATION WITH CORONARY ANGIOGRAM;  Surgeon: Peter M Martinique, MD;  Location: [facility]  LAB;  Service: Cardiovascular;  Laterality: N/A;  . PROSTATECTOMY  1998   radical for prostate cancer    Allergies  Allergen Reactions  . Ciprofloxacin Other (See Comments)    Hallucinations or jittery Hallucinations or jittery  . Codeine Other (See Comments)    hallucinations hallucinations  . Flexeril [Cyclobenzaprine]   . Methocarbamol   . Sulfa Antibiotics Other (See Comments)    Chills and shaking "serum sickness" Chills and shaking "serum sickness"  . Sulfamethoxazole Other (See Comments)    UNKNOWN REACTION, NOT DOCUMENTED  . Sulfonamide Derivatives Other (See Comments)    Chills and shaking "serum sickness"  . Flagyl [Metronidazole] Rash    Current Outpatient Medications  Medication Sig Dispense Refill  . albuterol (VENTOLIN HFA) 108 (90 Base) MCG/ACT inhaler Inhale 2 puffs into the lungs every 6 (six) hours as needed for wheezing or shortness of breath. 18 g 5  . Artificial Tear Solution (BION TEARS OP) Place 1 drop into both eyes daily as needed (dry eyes).    . carvedilol (COREG) 6.25 MG tablet TAKE 1 TABLET BY MOUTH TWICE A DAY WITH A MEAL 180 tablet 1  . clobetasol (TEMOVATE) 0.05 % external solution APPLY TO SCALP DAILY AS NEEDED  3  . ezetimibe (ZETIA) 10 MG tablet Take 1 tablet (10 mg total) by mouth daily. 90 tablet 1  . HYDROcodone-acetaminophen (NORCO/VICODIN) 5-325 MG tablet Take 0.5-1 tablets by mouth 2 (two) times daily as needed (pain). (Patient taking differently: Take 0.5-1 tablets by mouth once a week. ) 60 tablet 0  . hydrocortisone 2.5 % cream Apply 1 application topically 2 (two) times daily.    Marland Kitchen ipratropium (ATROVENT) 0.03 % nasal spray PLACE 2 SPRAYS INTO THE NOSE 4 (FOUR) TIMES DAILY. 30 mL 1  . levothyroxine (SYNTHROID, LEVOTHROID) 125 MCG tablet Take 1 tablet (125 mcg total) by mouth daily. 30 tablet 6  . liothyronine (CYTOMEL) 5 MCG tablet Take 1 tablet (5 mcg total) by mouth every morning. 90 tablet 3  . losartan-hydrochlorothiazide (HYZAAR)  100-12.5 MG tablet Take 1 tablet by mouth daily. KEEP OV. 90 tablet 0  . nitroGLYCERIN (NITROLINGUAL) 0.4 MG/SPRAY spray Place 1 spray under the tongue every 5 (five) minutes x 3 doses as needed for chest pain. 12 g 12  . Omega-3 Fatty Acids (FISH OIL) 1200 MG CAPS Take 1 capsule (1,200 mg total) by mouth daily. 30 capsule 0  . polyethylene glycol (MIRALAX / GLYCOLAX) packet Take 17 g by mouth daily. Mix in 8 oz liquid and drink    . triamcinolone cream (KENALOG) 0.1 % Apply 1 application topically daily as needed (rash).    Alveda Reasons 15 MG TABS tablet TAKE 1 TABLET (15 MG TOTAL) BY MOUTH DAILY WITH SUPPER. NOTE DOSE CHANGE. 90 tablet 0   No current  facility-administered medications for this visit.     Review of Systems : See HPI for pertinent positives and negatives.  Physical Examination  Vitals:   04/29/18 1032 04/29/18 1034  BP: (!) 142/75 138/74  Pulse: 66 62  Resp: 16   Temp: (!) 97 F (36.1 C)   TempSrc: Oral   SpO2: 94% 97%  Weight: 228 lb (103.4 kg)   Height: 5\' 6"  (1.676 m)    Body mass index is 36.8 kg/m.  General: WDWNobesemalein NAD GAIT:stiff HENT: no gross abnormalities  Eyes: PERRLA Pulmonary: Respirations are non-labored, good air movement, CTAB Cardiac:regularrhythm, nodetected murmur.  VASCULAR EXAM Carotid Bruits Right Left   Negative Negative   Abdominal aortic pulse is notpalpable. Radial pulses are2+palpable and equal.   LE Pulses Right Left  POPLITEAL notpalpable  notpalpable  POSTERIOR TIBIAL palpable  palpable   DORSALIS PEDIS ANTERIOR TIBIAL notpalpable  Not palpable    Gastrointestinal:soft, nontender, BS WNL, no r/g,nopalpable masses. Large abdomen.  Musculoskeletal:Nomuscle atrophy/wasting. M/S 5/5 throughout, extremities without ischemic changes. Skin: No rashes, no ulcers, no cellulitis. 1+ pitting and non pitting edema in both feet and ankles.    Neurologic:  A&O X 3; appropriate affect, sensation is normal; speech is normal, CN 2-12 intact except is mildly hard of hearing,, pain and light touch intact in extremities, motor exam as listed above. Psychiatric: Normal thought content, mood appropriate to clinical situation.     Assessment: [name] is a 84 y.o. male whois status post left carotid endarterectomy in 1998. He had two preoperative TIA's, bothwith amaurosis fugax in the left eye. Pt denies any known subsequent TIA or stroke.  His atherosclerotic risk factors include 35 year hx of smoking (quit in 1987), CAD, relatively sedentary lifestyle, and obesity.  He takes Xarelto for episode of atrial fib.  We discussed ways that he could become more physically active and not be too painful to his knees or shoulders, e.g, stationary bike, senior water aerobics.  He had had normal ABI's in September 2017 done at a facility elsewhere.    DATA Carotid Duplex (04-29-18): Right ICA: 60-79% stenosis; very high bifurcation, unable to visualize distal ICA due to depth of bifucation. Left ICA: 1-39% stenosis  Right vertebral artery flow is retrograde, left is antegrade.  Technically difficult exam due to body habitus and high bifurcations.   No change compared to the exam on 10-28-17.   Plan:  Referral to podiatrist in [place], pt unable to trim his toenails, has decreased pedal pulses.   Follow-up in6 months with Carotid Duplex scan.  I discussed in depth with the patient the nature of atherosclerosis, and emphasized the importance of maximal medical management including strict control of blood pressure, blood glucose, and lipid levels, obtaining regular exercise, and continued cessation of smoking.  The patient is aware that without maximal medical management the underlying atherosclerotic disease process will progress, limiting the benefit of any interventions. The patient was given information about stroke  prevention and what symptoms should prompt the patient to seek immediate medical care. Thank you for allowing [place] to participate in this patient's care.  Clemon Chambers, RN, MSN, FNP-C [facility] Office: [phone]  Clinic Physician: Donzetta Matters  04/29/18 10:40 AM

## 2018-04-30 ENCOUNTER — Encounter: Payer: Self-pay | Admitting: Family

## 2018-05-02 ENCOUNTER — Other Ambulatory Visit: Payer: Self-pay | Admitting: Cardiology

## 2018-05-05 ENCOUNTER — Other Ambulatory Visit: Payer: Self-pay | Admitting: Family Medicine

## 2018-05-05 DIAGNOSIS — J300 Vasomotor rhinitis: Secondary | ICD-10-CM

## 2018-05-10 NOTE — Progress Notes (Signed)
[facility] [address], [place], [place] [address] [phone]  Date:  05/11/2018   Name:  [name]   DOB:  [birth year]   MRN:  [id number]  PCP:  [name], MD    Chief Complaint: Trouble Hearing (trouble hearing, wax?, dizziness, loss of balance) and Shortness of Breath (cough, months, fatigue, when walking far he has to take nitroglycerin)   History of Present Illness:  [name] is a 84 y.o. very pleasant male patient who presents with the following:  Somewhat complex medical history which is summarized below OSA, TIA- seeing neurology, Dr. Brett Fairy, just recently had repeat sleep test and they are tying to get his CPAP sorted  HTN, CAD, a fib, hyperlipidemia- cardiology/ [place], he is on xarelto.  asa stopped.  BP has been controlled and his lipids are treated with Zetia CKD- nephrologist is Dr. Joelyn Oms, last seen by his office in July.  Reviewed this note; CKD3, continue coreg and losartan/hct for BP control Carotid disease-status post left carotid endarterectomy in 1998 by Dr. Amedeo Plenty, assisted by Dr. Scot Dock. He had two preoperative TIA's, bothwith amaurosis fugax in the left eye. Pt denies any known subsequent TIA or stroke.  Dr. Scot Dock evaluated ptin February, 2017with a 60-79% right carotid stenosis. As this was asymptomatic, Dr. Dicksonrecommended follow up carotid duplex in 6 months. The patient was on aspirin and was also on a statin. He has undergone PCTA in March 2017and has done well from that standpoint.  He does not seem to have claudication symptoms with walking. His wife states he is dyspneic with climbing stairs. He does havea stationary bike but does not use it.  Hypothyroidism- managed by myself, recent TSH normal Prostate cancer, hematuria s/p prostatectomy, he sees Dr. Rosana Hoes at [facility] who last saw him in April of this year  He has noted decreased hearing for months, he wonders if  earwax is built up in his ears He does have hearing aids but these do not seem to be helping much His hearing aids however are several years old, may not be working well any longer  For the last 2-3 months, he also notes SOB and then sometimes CP following exertion. He will notice SOB with walking even across the house. If he were then to exert himself more(her gives an example of walking across the room and then getting into bed) he will have CP.  Rest, relaxing makes the pain go away in 2-3 minutes He thinks he had some a fib for a few minutes once, but otherwise has not felt any issues  He did have to use his nitro a couple of times - he feels like it helped  He last had sx yesterday, no CP currently  He is in the middle of a sleep evaluation and CPAP adjustment He is taking his xarelto   Most recent cardiology visit in January of this year: 1. AF (paroxysmal atrial fibrillation) ([place])   2. Essential hypertension   3. Hyperlipidemia, unspecified hyperlipidemia type   4. Coronary artery disease involving native coronary artery of native heart without angina pectoris   5. Carotid artery disease, unspecified laterality ([place])    PLAN:   1. Paroxysmal atrial fibrillation: CHA2DS2-VASC score 7 (age, TIA, HTN, CAD, PAD), maintaining sinus rhythm on carvedilol. Continue Xarelto. He does have some bruising but no recurrent GI bleed. 2. CAD: Aspirin has been discontinued, I will leave it off for  now given the need for Xarelto.  He does have chronic stable angina class 2-3, this has not changed recently. Continue antianginal therapy. 3. Hypertension: Blood pressure well controlled. 4. Hyperlipidemia: Continue on Zetia, recent lipids are satisfactory. 5. Carotid artery disease: Followed by Dr. Scot Dock 6. History of TIA: No residual deficit  Albuterol prn Coreg zetia vicodin Synthroid Cytomel hyzaar Nitro prn xarelto   Accompanied as usual by his wife who helps give the history  today Declines a flu shot   Patient Active Problem List   Diagnosis Date Noted  . Poor compliance with CPAP treatment 04/20/2018  . Excessive daytime sleepiness 03/22/2018  . Poor sleep hygiene 03/22/2018  . Ecchymosis on examination 10/09/2017  . Ventral hernia without obstruction or gangrene 10/09/2017  . TIA (transient ischemic attack)   . Kidney stone   . Hypertension   . Hearing aid worn   . Coronary artery disease   . Asthma due to seasonal allergies   . Arthritis   . Erosive gastropathy 03/26/2017  . Acute blood loss anemia 03/25/2017  . Melena 03/24/2017  . CKD (chronic kidney disease), stage III ([place]) 03/24/2017  . AF (paroxysmal atrial fibrillation) ([place]) 03/24/2017  . Angina pectoris ([place]) 10/09/2015  . Essential hypertension   . Hyperlipemia   . Carotid artery occlusion   . Dizziness 08/09/2015  . Gait instability 05/30/2015  . Hypothyroidism, acquired 10/26/2013  . OSA (obstructive sleep apnea) 10/18/2013  . Benign localized hyperplasia of prostate with urinary obstruction and other lower urinary tract symptoms (LUTS)(600.21) 12/21/2012  . Carcinoma in situ of prostate 12/21/2012  . Left shoulder pain 08/08/2012  . Obesity 07/24/2011  . Generalized anxiety disorder 07/24/2011  . DIVERTICULOSIS OF COLON 12/25/2009  . DERMATITIS, SEBORRHEIC 12/25/2009  . 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 Memory loss 08/14/2009  . VENOUS INSUFFICIENCY, CHRONIC 09/09/2006  . GASTROESOPHAGEAL REFLUX, NO ESOPHAGITIS 09/09/2006  . HERNIA, HIATAL, NONCONGENITAL 09/09/2006  . INSOMNIA NOS 09/09/2006    Past Medical History:  Diagnosis Date  . Anginal pain ([place])   . Anxiety    " OCCASIONAL"  . Arthritis   . Asthma due to seasonal allergies    uses inhalers prn  . Carotid artery occlusion    Left  . Complication of anesthesia    " had tremors after prostate surgery  . Coronary artery disease   . Diverticulitis    recurrent  . Family history of anesthesia  complication    MOTHER   . GERD (gastroesophageal reflux disease)   . H/O hiatal hernia   . Hearing aid worn   . Hyperlipemia   . Hypertension   . Kidney stone    lithotripsy 9211 w complications, req stents, Dr Rosana Hoes  . Prostate CA ([place])   . Shortness of breath   . Thyroid disease   . TIA (transient ischemic attack)   . Tinnitus     Past Surgical History:  Procedure Laterality Date  . CARDIAC CATHETERIZATION  2008   minimal dz, Dr Cathie Olden  . CARDIAC CATHETERIZATION N/A 10/09/2015   Procedure: Left Heart Cath and Coronary Angiography;  Surgeon: Peter M [place], MD;  Location: [facility];  Service: Cardiovascular;  Laterality: N/A;  . CARDIAC CATHETERIZATION N/A 10/09/2015   Procedure: Intravascular Pressure Wire/FFR Study;  Surgeon: Peter M [place], MD;  Location: [facility];  Service: Cardiovascular;  Laterality: N/A;  . CARDIAC CATHETERIZATION N/A 10/09/2015   Procedure: Coronary Stent Intervention;  Surgeon: Peter M [place], MD;  Location: [facility]  INVASIVE CV LAB;  Service: Cardiovascular;  Laterality: N/A;  . CAROTID ENDARTERECTOMY  Left   1998  . CHOLECYSTECTOMY    . CORONARY STENT PLACEMENT  10/09/2015   DES to RCA  . ESOPHAGOGASTRODUODENOSCOPY (EGD) WITH PROPOFOL Left 03/26/2017   Procedure: ESOPHAGOGASTRODUODENOSCOPY (EGD) WITH PROPOFOL;  Surgeon: Arta Silence, MD;  Location: [facility] ENDOS[facility];  Service: Endoscopy;  Laterality: Left;  . LEFT HEART CATHETERIZATION WITH CORONARY ANGIOGRAM N/A 09/15/2013   Procedure: LEFT HEART CATHETERIZATION WITH CORONARY ANGIOGRAM;  Surgeon: Peter M [place], MD;  [facility] CATH LAB;  Service: Cardiovascular;  Laterality: N/A;  . PROSTATECTOMY  1998   radical for prostate cancer    Social History   Tobacco Use  . Smoking status: Former Smoker    Packs/day: 2.50    Years: 30.00    Pack years: 75.00    Types: Cigarettes    Last attempt to quit: 08/28/1985    Years since quitting: 32.7  . Smokeless tobacco: Never Used  Substance  Use Topics  . Alcohol use: Yes    Alcohol/week: 2.0 standard drinks    Types: 2 Standard drinks or equivalent per week    Comment: 1 glass wine/week (prior 1 glass martini with dinner)  . Drug use: No    Family History  Problem Relation Age of Onset  . Heart disease Mother   . Heart disease Father   . Kidney failure Father   . Heart disease Sister   . Heart attack Sister   . Dementia Sister   . Sjogren's syndrome Child   . Hashimoto's thyroiditis Child   . CAD Child     Allergies  Allergen Reactions  . Ciprofloxacin Other (See Comments)    Hallucinations or jittery Hallucinations or jittery  . Codeine Other (See Comments)    hallucinations hallucinations  . Flexeril [Cyclobenzaprine]   . Methocarbamol   . Sulfa Antibiotics Other (See Comments)    Chills and shaking "serum sickness" Chills and shaking "serum sickness"  . Sulfamethoxazole Other (See Comments)    UNKNOWN REACTION, NOT DOCUMENTED  . Sulfonamide Derivatives Other (See Comments)    Chills and shaking "serum sickness"  . Flagyl [Metronidazole] Rash    Medication list has been reviewed and updated.  Current Outpatient Medications on File Prior to Visit  Medication Sig Dispense Refill  . albuterol (VENTOLIN HFA) 108 (90 Base) MCG/ACT inhaler Inhale 2 puffs into the lungs every 6 (six) hours as needed for wheezing or shortness of breath. 18 g 5  . Artificial Tear Solution (BION TEARS OP) Place 1 drop into both eyes daily as needed (dry eyes).    . carvedilol (COREG) 6.25 MG tablet TAKE 1 TABLET BY MOUTH TWICE A DAY WITH A MEAL 180 tablet 1  . clobetasol (TEMOVATE) 0.05 % external solution APPLY TO SCALP DAILY AS NEEDED  3  . ezetimibe (ZETIA) 10 MG tablet Take 1 tablet (10 mg total) by mouth daily. 90 tablet 1  . HYDROcodone-acetaminophen (NORCO/VICODIN) 5-325 MG tablet Take 0.5-1 tablets by mouth 2 (two) times daily as needed (pain). (Patient taking differently: Take 0.5-1 tablets by mouth once a week. ) 60  tablet 0  . hydrocortisone 2.5 % cream Apply 1 application topically 2 (two) times daily.    [place] Kitchen ipratropium (ATROVENT) 0.03 % nasal spray PLACE 2 SPRAYS INTO THE NOSE 4 (FOUR) TIMES DAILY. 30 mL 1  . levothyroxine (SYNTHROID, LEVOTHROID) 125 MCG tablet Take 1 tablet (125 mcg total) by mouth daily. 30 tablet 6  . liothyronine (  CYTOMEL) 5 MCG tablet Take 1 tablet (5 mcg total) by mouth every morning. 90 tablet 3  . losartan-hydrochlorothiazide (HYZAAR) 100-12.5 MG tablet TAKE 1 TABLET BY MOUTH DAILY. KEEP OV. 90 tablet 0  . nitroGLYCERIN (NITROLINGUAL) 0.4 MG/SPRAY spray Place 1 spray under the tongue every 5 (five) minutes x 3 doses as needed for chest pain. 12 g 12  . Omega-3 Fatty Acids (FISH OIL) 1200 MG CAPS Take 1 capsule (1,200 mg total) by mouth daily. 30 capsule 0  . polyethylene glycol (MIRALAX / GLYCOLAX) packet Take 17 g by mouth daily. Mix in 8 oz liquid and drink    . triamcinolone cream (KENALOG) 0.1 % Apply 1 application topically daily as needed (rash).    Alveda Reasons 15 MG TABS tablet TAKE 1 TABLET (15 MG TOTAL) BY MOUTH DAILY WITH SUPPER. NOTE DOSE CHANGE. 90 tablet 0   No current facility-administered medications on file prior to visit.     Review of Systems:  As per HPI- otherwise negative. No fever or chills He has noted a mild cough for several months No active CP now    Physical Examination: Vitals:   05/11/18 1008  BP: 120/80  Pulse: 61  Resp: 16  Temp: 97.6 F (36.4 C)  SpO2: 98%   Vitals:   05/11/18 1008  Weight: 226 lb (102.5 kg)  Height: 5\' 6"  (1.676 m)   Body mass index is 36.48 kg/m. Ideal Body Weight: Weight in (lb) to have BMI = 25: 154.6  GEN: WDWN, NAD, Non-toxic, A & O x 3, obese HEENT: Atraumatic, Normocephalic. Neck supple. No masses, No LAD.minimal ear wax, not enough to occlude or reduce his hearing is noted He is quite HOH  Ears and Nose: No external deformity. CV: RRR, No M/G/R. No JVD. No thrill. No extra heart sounds. PULM: CTA B,  no wheezes, crackles, rhonchi. No retractions. No resp. distress. No accessory muscle use. ABD: S, NT, ND, +BS. No rebound. No HSM. EXTR: No c/c.  Mild LE edema is present  NEURO Normal gait.  PSYCH: Normally interactive. Conversant. Not depressed or anxious appearing.  Calm demeanor.   EKG: right BBB, no change from EKG from a year ago noted    Assessment and Plan: Shortness of breath - Plan: DG Chest 2 View  Chest pain, unspecified type - Plan: EKG 12-Lead  Mixed conductive and sensorineural hearing loss of both ears  following up today - suspect that he just needs new hearing aids and he will look into this.  However, he also has what sounds like recurrent angina.  Notes that his sx now remind him of before he got his stent in 2017, although not as severe as they were then.  Called cardiology who kindly arranged to see him this afternoon. Pt and his wife are aware and will go to appt Got chest film as well-  Normal  CHEST - 2 VIEW  COMPARISON:  03/13/2017  FINDINGS: Cardiac shadow is within normal limits. Aortic calcifications are noted. The lungs are well aerated bilaterally. Degenerative changes of the thoracic spine are noted.  IMPRESSION: No active cardiopulmonary disease. Signed Lamar Blinks, MD

## 2018-05-11 ENCOUNTER — Telehealth: Payer: Self-pay | Admitting: Cardiology

## 2018-05-11 ENCOUNTER — Ambulatory Visit (HOSPITAL_BASED_OUTPATIENT_CLINIC_OR_DEPARTMENT_OTHER)
Admission: RE | Admit: 2018-05-11 | Discharge: 2018-05-11 | Disposition: A | Discharge status: Home / Self Care | Payer: 59 | Source: Ambulatory Visit | Attending: Family Medicine | Admitting: Family Medicine

## 2018-05-11 ENCOUNTER — Ambulatory Visit: Payer: 59 | Admitting: Physician Assistant

## 2018-05-11 ENCOUNTER — Encounter: Payer: Self-pay | Admitting: Family Medicine

## 2018-05-11 ENCOUNTER — Ambulatory Visit: Payer: 59 | Admitting: Family Medicine

## 2018-05-11 ENCOUNTER — Encounter: Payer: Self-pay | Admitting: Physician Assistant

## 2018-05-11 VITALS — BP 112/64 | HR 69 | Ht 66.0 in | Wt 226.0 lb

## 2018-05-11 VITALS — BP 120/80 | HR 61 | Temp 97.6°F | Resp 16 | Ht 66.0 in | Wt 226.0 lb

## 2018-05-11 DIAGNOSIS — I480 Paroxysmal atrial fibrillation: Secondary | ICD-10-CM | POA: Diagnosis not present

## 2018-05-11 DIAGNOSIS — I5033 Acute on chronic diastolic (congestive) heart failure: Secondary | ICD-10-CM

## 2018-05-11 DIAGNOSIS — Z23 Encounter for immunization: Secondary | ICD-10-CM

## 2018-05-11 DIAGNOSIS — I209 Angina pectoris, unspecified: Secondary | ICD-10-CM

## 2018-05-11 DIAGNOSIS — N183 Chronic kidney disease, stage 3 unspecified: Secondary | ICD-10-CM

## 2018-05-11 DIAGNOSIS — R079 Chest pain, unspecified: Secondary | ICD-10-CM

## 2018-05-11 DIAGNOSIS — H906 Mixed conductive and sensorineural hearing loss, bilateral: Secondary | ICD-10-CM

## 2018-05-11 DIAGNOSIS — R0602 Shortness of breath: Secondary | ICD-10-CM

## 2018-05-11 MED ORDER — ISOSORBIDE MONONITRATE ER 30 MG PO TB24: 30.0000 mg | ORAL_TABLET | Freq: Every day | ORAL | 2 refills | Status: DC

## 2018-05-11 MED ORDER — LOSARTAN POTASSIUM 50 MG PO TABS: 50.0000 mg | ORAL_TABLET | Freq: Every day | ORAL | 2 refills | Status: DC

## 2018-05-11 MED ORDER — FUROSEMIDE 20 MG PO TABS: 20.0000 mg | ORAL_TABLET | Freq: Every day | ORAL | 2 refills | Status: DC

## 2018-05-11 NOTE — Telephone Encounter (Signed)
DR HARDING DISCUSSED WITH DR COPELAND ABOUT PATIENT.  PATIENT IS HAVING ANGINAL EPISODES FOR THE PAST WEEK.  APPOINTMENT SCHEDULE FOR  TODAY WITH EXTENDER.- DR COPELAND WILL INFORM PATIENT.

## 2018-05-11 NOTE — Progress Notes (Signed)
Cardiology Office Note   Date:  05/11/2018   ID:  [name], DOB [birth year], MRN [id number]  PCP:  [name], MD Cardiologist:  Peter Martinique, MD 07/22/2017 Rosaria Ferries, PA-C 10/17/2015  Chief Complaint  Patient presents with  . Chest Pain  . Shortness of Breath    History of Present Illness: [name] is a 84 y.o. male with a history of  DES RCA 2017, GERD, HTN, HLD, TIA, L CEA 1998, prostate CA s/p radical prostatectomy 1998, OSA with CPAP pending, weakness with statins on Zetia alone, hypothyroid, PAF dx 03/2017 on Xarelto and ASA stopped, erosive gastritis dx while on Xarelto>>bid PPI, CKD III. CHAD2DS2-VASc = 6 (age x 2, TIA x 2, CAD/PAD, HTN)  Patient seen 10/30 by Dr. Lorelei Pont and was having exertional chest pain, appointment made  [name] presents for cardiology evaluation.   He has significant DOE. It has gotten worse in the several months. He cannot walk 50 feet without getting SOB.   When he walks more, he gets chest pain across his chest.   When he goes to bed at night, he has to work to get to the right spot. He uses nitro with relief.   It gets to a 3/10. It is his angina, but milder than what he used to get. He is already SOB, denies N&V or diaphoresis.   He admits that he does not always take the Zetia.  He says he has trouble remembering it because he takes so many medications at different times of the day.  Of note, he ambulated in the office and his oxygen saturation never dropped below 93%.  He was very short of breath with ambulation less than 100 feet, but did not get chest pain.   Past Medical History:  Diagnosis Date  . Anginal pain ([place])   . Anxiety    " OCCASIONAL"  . Arthritis   . Asthma due to seasonal allergies    uses inhalers prn  . Carotid artery occlusion    Left  . Complication of anesthesia    " had tremors after prostate surgery  . Coronary artery disease   . Diverticulitis    recurrent  . Family history  of anesthesia complication    MOTHER   . GERD (gastroesophageal reflux disease)   . H/O hiatal hernia   . Hearing aid worn   . Hyperlipemia   . Hypertension   . Kidney stone    lithotripsy 9518 w complications, req stents, Dr Rosana Hoes  . Prostate CA ([place])   . Shortness of breath   . Thyroid disease   . TIA (transient ischemic attack)   . Tinnitus     Past Surgical History:  Procedure Laterality Date  . CARDIAC CATHETERIZATION  2008   minimal dz, Dr Cathie Olden  . CARDIAC CATHETERIZATION N/A 10/09/2015   Procedure: Left Heart Cath and Coronary Angiography;  Surgeon: Peter M Martinique, MD;  Location: [facility];  Service: Cardiovascular;  Laterality: N/A;  . CARDIAC CATHETERIZATION N/A 10/09/2015   Procedure: Intravascular Pressure Wire/FFR Study;  Surgeon: Peter M Martinique, MD;  Location: [facility];  Service: Cardiovascular;  Laterality: N/A;  . CARDIAC CATHETERIZATION N/A 10/09/2015   Procedure: Coronary Stent Intervention;  Surgeon: Peter M Martinique, MD;  Location: [facility];  Service: Cardiovascular;  Laterality: N/A;  . CAROTID ENDARTERECTOMY  Left   1998  . CHOLECYSTECTOMY    . CORONARY STENT PLACEMENT  10/09/2015  DES to RCA  . ESOPHAGOGASTRODUODENOSCOPY (EGD) WITH PROPOFOL Left 03/26/2017   Procedure: ESOPHAGOGASTRODUODENOSCOPY (EGD) WITH PROPOFOL;  Surgeon: Arta Silence, MD;  Location: [facility];  Service: Endoscopy;  Laterality: Left;  . LEFT HEART CATHETERIZATION WITH CORONARY ANGIOGRAM N/A 09/15/2013   Procedure: LEFT HEART CATHETERIZATION WITH CORONARY ANGIOGRAM;  Surgeon: Peter M Martinique, MD;  Location: [facility];  Service: Cardiovascular;  Laterality: N/A;  . PROSTATECTOMY  1998   radical for prostate cancer    Current Outpatient Medications  Medication Sig Dispense Refill  . albuterol (VENTOLIN HFA) 108 (90 Base) MCG/ACT inhaler Inhale 2 puffs into the lungs every 6 (six) hours as needed for wheezing or shortness of breath. 18 g 5  . Artificial  Tear Solution (BION TEARS OP) Place 1 drop into both eyes daily as needed (dry eyes).    . carvedilol (COREG) 6.25 MG tablet TAKE 1 TABLET BY MOUTH TWICE A DAY WITH A MEAL 180 tablet 1  . clobetasol (TEMOVATE) 0.05 % external solution APPLY TO SCALP DAILY AS NEEDED  3  . ezetimibe (ZETIA) 10 MG tablet Take 1 tablet (10 mg total) by mouth daily. 90 tablet 1  . HYDROcodone-acetaminophen (NORCO/VICODIN) 5-325 MG tablet Take 0.5-1 tablets by mouth 2 (two) times daily as needed (pain). (Patient taking differently: Take 0.5-1 tablets by mouth once a week. ) 60 tablet 0  . hydrocortisone 2.5 % cream Apply 1 application topically 2 (two) times daily.    Marland Kitchen ipratropium (ATROVENT) 0.03 % nasal spray PLACE 2 SPRAYS INTO THE NOSE 4 (FOUR) TIMES DAILY. 30 mL 1  . levothyroxine (SYNTHROID, LEVOTHROID) 125 MCG tablet Take 1 tablet (125 mcg total) by mouth daily. 30 tablet 6  . liothyronine (CYTOMEL) 5 MCG tablet Take 1 tablet (5 mcg total) by mouth every morning. 90 tablet 3  . losartan-hydrochlorothiazide (HYZAAR) 100-12.5 MG tablet TAKE 1 TABLET BY MOUTH DAILY. KEEP OV. 90 tablet 0  . nitroGLYCERIN (NITROLINGUAL) 0.4 MG/SPRAY spray Place 1 spray under the tongue every 5 (five) minutes x 3 doses as needed for chest pain. 12 g 12  . Omega-3 Fatty Acids (FISH OIL) 1200 MG CAPS Take 1 capsule (1,200 mg total) by mouth daily. 30 capsule 0  . polyethylene glycol (MIRALAX / GLYCOLAX) packet Take 17 g by mouth daily. Mix in 8 oz liquid and drink    . triamcinolone cream (KENALOG) 0.1 % Apply 1 application topically daily as needed (rash).    Alveda Reasons 15 MG TABS tablet TAKE 1 TABLET (15 MG TOTAL) BY MOUTH DAILY WITH SUPPER. NOTE DOSE CHANGE. 90 tablet 0   No current facility-administered medications for this visit.     Allergies:   Ciprofloxacin; Codeine; Flexeril [cyclobenzaprine]; Methocarbamol; Sulfa antibiotics; Sulfamethoxazole; Sulfonamide derivatives; and Flagyl [metronidazole]    Social History:  The patient   reports that he quit smoking about 32 years ago. His smoking use included cigarettes. He has a 75.00 pack-year smoking history. He has never used smokeless tobacco. He reports that he drinks about 2.0 standard drinks of alcohol per week. He reports that he does not use drugs.   Family History:  The patient's family history includes CAD in his child; Dementia in his sister; Hashimoto's thyroiditis in his child; Heart attack in his sister; Heart disease in his father, mother, and sister; Kidney failure in his father; Sjogren's syndrome in his child.  He indicated that his mother is deceased. He indicated that his father is deceased. He indicated that only one of his four sisters  is alive. He indicated that two of his five children are alive.   ROS:  Please see the history of present illness. All other systems are reviewed and negative.    PHYSICAL EXAM: VS:  BP 112/64   Pulse 69   Ht 5\' 6"  (1.676 m)   Wt 226 lb (102.5 kg)   SpO2 96%   BMI 36.48 kg/m  , BMI Body mass index is 36.48 kg/m. GEN: Well nourished, well developed, male in no acute distress HEENT: normal for age  Neck: no JVD, no carotid bruit, no masses Cardiac: RRR; 2/6 murmur, no rubs, or gallops Respiratory: Decreased breath sounds bases bilaterally, normal work of breathing GI: soft, nontender, nondistended, + BS MS: no deformity or atrophy; no edema; distal pulses are 2+ in all 4 extremities  Skin: warm and dry, no rash Neuro:  Strength and sensation are intact Psych: euthymic mood, full affect   EKG:  EKG is not ordered today. The ECG from Dr. Lorelei Pont is reviewed, it is sinus bradycardia, heart rate 57 with a right bundle branch block, which is old   CATH: 10/09/2015  Mid RCA lesion, 40% stenosed.  Prox LAD to Mid LAD lesion, 20% stenosed.  Ost 1st Mrg to 1st Mrg lesion, 40% stenosed.  The left ventricular systolic function is normal.  Prox RCA lesion, 70% stenosed. Post intervention, there is a 0% residual  stenosis.   1. Single vessel obstructive CAD 2. Abnormal FFR of the proximal RCA lesion 3. Normal LV function 4. Successful stenting of the proximal RCA with a DES  Plan: DAPT for one year. Anticipate DC in am.   Carotid Dopplers: 04/29/2018 Summary: Right Carotid: Velocities in the right ICA are consistent with a 60-79%        stenosis, calcific plaque may obscure higher velocity. Retrograde        vertebral artery flow.  Left Carotid: Velocities in the left ICA are consistent with a 1-39% stenosis,       status post endarterectomy, calcific plaque may obscure higher       velocity. Limited visualization due to depth of artery.   Recent Labs: 01/24/2018: ALT 25; BUN 28; Creatinine, Ser 2.01; Hemoglobin 14.0; Platelets 147.0; Potassium 4.5; Sodium 139 04/21/2018: TSH 0.95  CBC    Component Value Date/Time   WBC 6.0 01/24/2018 1152   RBC 3.81 (L) 01/24/2018 1152   HGB 14.0 01/24/2018 1152   HGB 14.2 11/30/2016 1142   HCT 39.8 01/24/2018 1152   HCT 39.6 11/30/2016 1142   PLT 147.0 (L) 01/24/2018 1152   PLT 120 (L) 11/30/2016 1142   MCV 104.3 (H) 01/24/2018 1152   MCV 99 (H) 11/30/2016 1142   MCH 35.4 (H) 03/25/2017 0549   MCHC 35.2 01/24/2018 1152   RDW 14.3 01/24/2018 1152   RDW 13.1 11/30/2016 1142   LYMPHSABS 1.8 03/24/2017 1729   LYMPHSABS 1.3 11/30/2016 1142   MONOABS 0.9 03/24/2017 1729   EOSABS 0.8 (H) 03/24/2017 1729   EOSABS 0.7 (H) 11/30/2016 1142   BASOSABS 0.2 (H) 03/24/2017 1729   BASOSABS 0.2 11/30/2016 1142   CMP Latest Ref Rng & Units 01/24/2018 09/27/2017 07/09/2017  Glucose 70 - 99 mg/dL 117(H) 114(H) -  BUN 6 - 23 mg/dL 28(H) 25(H) -  Creatinine 0.40 - 1.50 mg/dL 2.01(H) 1.84(H) -  Sodium 135 - 145 mEq/L 139 141 -  Potassium 3.5 - 5.1 mEq/L 4.5 4.2 -  Chloride 96 - 112 mEq/L 103 100 -  CO2 19 - 32  mEq/L 28 29 -  Calcium 8.4 - 10.5 mg/dL 9.2 10.2 -  Total Protein 6.0 - 8.3 g/dL 6.8 7.5 7.3  Total Bilirubin 0.2 -  1.2 mg/dL 1.6(H) 2.0(H) 1.4(H)  Alkaline Phos 39 - 117 U/L 53 51 65  AST 0 - 37 U/L 18 19 23   ALT 0 - 53 U/L 25 31 26      Lipid Panel    Component Value Date/Time   CHOL 153 07/09/2017 0938   TRIG 198 (H) 07/09/2017 0938   HDL 38 (L) 07/09/2017 0938   CHOLHDL 4.0 07/09/2017 0938   CHOLHDL 3.3 03/24/2015 1159   VLDL 24 03/24/2015 1159   LDLCALC 75 07/09/2017 0938   LDLDIRECT 70 12/21/2012 1015     Wt Readings from Last 3 Encounters:  05/11/18 226 lb (102.5 kg)  05/11/18 226 lb (102.5 kg)  04/29/18 228 lb (103.4 kg)     Other studies Reviewed: Additional studies/ records that were reviewed today include: Office notes, hospital records and testing.  ASSESSMENT AND PLAN:  1.  Anginal chest pain: His symptoms are very concerning for angina.  However, his creatinine is significantly elevated. - An initial trial of medical therapy is indicated. -Start Imdur 30 mg daily and uptitrate as needed. -Continue beta-blocker and Zetia, encourage compliance with city of - Not on aspirin because of the Xarelto  2.  PAF: He has not had any symptoms that he thought were from atrial fibrillation. -Continue beta-blocker and anticoagulation  3.  Acute on chronic diastolic CHF: -His EF has been normal in the past, check an echo - Losartan HCTZ 100/12.5 is not keeping the fluid off. -Start Lasix 20 mg daily - However, I am not sure his blood pressure will tolerate both the addition of Imdur and Lasix. -Therefore, split the losartan and HCTZ, discontinue the HCTZ for the Lasix and decrease the losartan to 50 mg daily. -he is to follow daily weights and stick to a low-sodium diet.  4.  Chronic kidney disease, stage III: - His creatinine was 2.010 in July 2019 - Check today and continue to follow.  5.  Hyperlipidemia: -His LDL was 75 and his HDL was 38 in December 2018 - Once his other medical issues are stabilized, recheck and decide on referring him for consideration of [place] or PCSK9  therapy, versus continuing Zetia and omega-3's  Current medicines are reviewed at length with the patient today.  The patient does not have concerns regarding medicines.  The following changes have been made: Start Lasix 20 mg daily, DC losartan HCTZ, start losartan 50 mg daily  Labs/ tests ordered today include:   Orders Placed This Encounter  Procedures  . Basic metabolic panel  . ECHOCARDIOGRAM COMPLETE     Disposition:   FU with Peter Martinique, MD  Signed, Rosaria Ferries, PA-C  05/11/2018 4:41 PM    [place] Phone: [phone]; Fax: [phone]  This note was written with the assistance of speech recognition software.  Please excuse any transcriptional errors.

## 2018-05-11 NOTE — Patient Instructions (Signed)
Medication Instructions:  Stop Losartan/HCTZ  Start Losartan 50 mg daily. Start Lasix 20 mg daily. If you need a refill on your cardiac medications before your next appointment, please call your pharmacy.   Lab work: BMET If you have labs (blood work) drawn today and your tests are completely normal, you will receive your results only by: Marland Kitchen MyChart Message (if you have MyChart) OR . A paper copy in the mail If you have any lab test that is abnormal or we need to change your treatment, we will call you to review the results.  Testing/Procedures: Your physician has requested that you have an echocardiogram. Echocardiography is a painless test that uses sound waves to create images of your heart. It provides your doctor with information about the size and shape of your heart and how well your heart's chambers and valves are working. This procedure takes approximately one hour. There are no restrictions for this procedure.  Follow-Up: At [facility], you and your health needs are our priority.  As part of our continuing mission to provide you with exceptional heart care, we have created designated Provider Care Teams.  These Care Teams include your primary Cardiologist (physician) and Advanced Practice Providers (APPs -  Physician Assistants and Nurse Practitioners) who all work together to provide you with the care you need, when you need it. You will need a follow up appointment first available. You may see [name], MDor one of the following Advanced Practice Providers on your designated Care Team: Almyra Deforest, [place] . Fabian Sharp, PA-C . Rosaria Ferries, PA-C  Any Other Special Instructions Will Be Listed Below (If Applicable). None

## 2018-05-11 NOTE — Patient Instructions (Addendum)
Please go to the ground floor and have a chest x-ray - then you are all set to go You have a cardiology appt this afternoon at 2pm; please be sure to make this appt   You might want to get some new hearing aids- Perhaps try [facility] ? Address: [address], [place], [place] [address] ? Phone: [phone]

## 2018-05-12 DIAGNOSIS — Z23 Encounter for immunization: Secondary | ICD-10-CM | POA: Diagnosis not present

## 2018-05-12 LAB — BASIC METABOLIC PANEL
BUN / CREAT RATIO: 20 (ref 10–24)
BUN: 37 mg/dL — AB (ref 8–27)
CO2: 22 mmol/L (ref 20–29)
CREATININE: 1.83 mg/dL — AB (ref 0.76–1.27)
Calcium: 9.1 mg/dL (ref 8.6–10.2)
Chloride: 106 mmol/L (ref 96–106)
GFR calc Af Amer: 39 mL/min/{1.73_m2} — ABNORMAL LOW (ref >59)
GFR calc non Af Amer: 34 mL/min/{1.73_m2} — ABNORMAL LOW (ref >59)
Glucose: 108 mg/dL — ABNORMAL HIGH (ref 65–99)
Potassium: 4.2 mmol/L (ref 3.5–5.2)
Sodium: 143 mmol/L (ref 134–144)

## 2018-05-12 NOTE — Addendum Note (Signed)
Addended by: Wynonia Musty A on: 05/12/2018 02:17 PM   Modules accepted: Orders

## 2018-05-15 ENCOUNTER — Ambulatory Visit (INDEPENDENT_AMBULATORY_CARE_PROVIDER_SITE_OTHER): Payer: Medicare Other | Admitting: Neurology

## 2018-05-15 DIAGNOSIS — I201 Angina pectoris with documented spasm: Secondary | ICD-10-CM

## 2018-05-15 DIAGNOSIS — G4733 Obstructive sleep apnea (adult) (pediatric): Secondary | ICD-10-CM

## 2018-05-15 DIAGNOSIS — G4719 Other hypersomnia: Secondary | ICD-10-CM

## 2018-05-15 DIAGNOSIS — I480 Paroxysmal atrial fibrillation: Secondary | ICD-10-CM

## 2018-05-15 DIAGNOSIS — R351 Nocturia: Secondary | ICD-10-CM

## 2018-05-15 DIAGNOSIS — Z9114 Patient's other noncompliance with medication regimen: Secondary | ICD-10-CM

## 2018-05-19 DIAGNOSIS — I201 Angina pectoris with documented spasm: Secondary | ICD-10-CM | POA: Insufficient documentation

## 2018-05-19 NOTE — Addendum Note (Signed)
Addended by: Larey Seat on: 05/19/2018 01:58 PM   Modules accepted: Orders

## 2018-05-19 NOTE — Procedures (Signed)
PATIENT'S NAME:  [name], [name] DOB:      Jun 02, 1934      MR#:    [id number]     DATE OF RECORDING: 05/15/2018 REFERRING M.D.:  Zachary Blinks, MD Study Performed:  Titration to CPAP HISTORY:  [name], age 84, is returning for a CPAP Titration sleep study following a HST on watch pat device from 04/13/18, which documented an AHI of 30.6/h REM AHI of 40/h. and 22 minutes of oxygen desaturation time, with an Oxygen nadir of 82%.  The patient had been diagnosed with sleep apnea before, but did not tolerate CPAP therapy. He has developed atrial fibrillation, is anticoagulated, has HTN, and has hearing loss.  The patient endorsed the Epworth Sleepiness Scale at 23/24 points.   The patient's weight 227 pounds with a height of 66 (inches), resulting in a BMI of 36.5 kg/m2. The patient's neck circumference measured 16.5 inches.  CURRENT MEDICATIONS: Ventolin, Coreg, Temovate, Zetia, Norco, Atrovent, Synthroid, Cytomel, Hyzaar, Nitro-lingual.  PROCEDURE:  This is a multichannel digital polysomnogram utilizing the SomnoStar 11.2 system.  Electrodes and sensors were applied and monitored per AASM Specifications.   EEG, EOG, Chin and Limb EMG, were sampled at 200 Hz.  ECG, Snore and Nasal Pressure, Thermal Airflow, Respiratory Effort, CPAP Flow and Pressure, Oximetry was sampled at 50 Hz. Digital video and audio were recorded.      CPAP was initiated at 5 cmH20 with heated humidity per AASM split night standards and pressure was advanced to 8 cmH20 because of hypopneas, apneas and desaturations.  At a PAP pressure of 7 cmH20, there was a reduction of the AHI to 2.2/h with improvement of sleep apnea, sleep efficiency of 99.1% for this pressure, total sleep time was 55.5 minutes, he entered REM sleep.    He used his own FFM, Simplus in medium.  Lights Out was at 22:21 and Lights On at 04:57. Total recording time (TRT) was 397 minutes, with a total sleep time (TST) of 207.5 minutes. The patient's sleep latency  was 46 minutes. REM latency was 260 minutes. The sleep efficiency overall was 52.3 %.    SLEEP ARCHITECTURE: WASO (Wake after sleep onset) was 150.5 minutes.  There were 21 minutes in Stage N1, 112.5 minutes Stage N2, 67.5 minutes Stage N3 and 6.5 minutes in Stage REM.  The percentage of Stage N1 was 10.1%, Stage N2 was 54.2%, Stage N3 was 32.5% and Stage R (REM sleep) was 3.1%. The sleep architecture was notable for fragmentation, but the patient slept best in non- supine position at 7 cm water CPA Pressure.  RESPIRATORY ANALYSIS:  There was a total of 9 respiratory events: 0 apneas and 9 hypopneas without (RERAs).     The total APNEA/HYPOPNEA INDEX (AHI) was 2.6 /hour.  2 events occurred in REM sleep and 7 events in NREM. The REM AHI was 18.5 /hour versus a non-REM AHI of 2.1 /hour.  The patient spent 78.5 minutes of total sleep time in the supine position and 129 minutes in non-supine.  The supine AHI was 5.4, versus a non-supine AHI of 0.9.  OXYGEN SATURATION & C02:  The baseline 02 saturation was 96%, with the lowest being 87%. Time spent below 89% saturation equaled 5 minutes. PERIODIC LIMB MOVEMENTS:  The patient had a total of 21 Periodic Limb Movements. The Periodic Limb Movement (PLM) index was 6.1 and the PLM Arousal index was 2.0 /hour.   Audio and video analysis did not show any abnormal or unusual movements, behaviors, phonations  or vocalizations. The patient woke up at 4.40 AM and wanted to take his sleep aid.   The patient took one bathroom break. Mild Snoring was noted. EKG was in keeping with normal sinus rhythm with isolated PVCs.  Post-study, the patient indicated that sleep was worse than usual. The patient was using his own SIMPLUS full face Mask.  DIAGNOSIS 1. Obstructive Sleep Apnea and hypopnea is controlled under CPAP at 7 cm water, with the use of the patient's own mask, he was able to sleep non-supine which contributed to the lower AHI.  2. Overall , this patient has  difficulties achieving sustained sleep- poor sleep efficiency,     PLANS/RECOMMENDATIONS: Numerically, the AHI was well controlled and the patient reached sustained sleep under 7 cm water CPAP, but his subjective experience was that of poor quality sleep.    1. The patient should avoid the supine sleep position as much as possible. 2. Avoid hypnotic medication and alcoholic beverage consumption before bedtime, as applicable.  3. CPAP therapy compliance is defined as 4 hours or more of nightly use.    DISCUSSION: A 30 day trial period on CPAP 5-8 cm water with Simplus mask is recommended. Alternatively , if this trial period fails- If the patient can't get used to CPAP and/or needs reminding him of using it, I would change to a likely less effective dental device.   A follow up appointment will be scheduled in the Sleep Clinic at [facility].   Please call [phone] with any questions.     I certify that I have reviewed the entire raw data recording prior to the issuance of this report in accordance with the Standards of Accreditation of the American Academy of Sleep Medicine (AASM)   [name], M.D.   05-19-2018  Diplomat, American Board of Psychiatry and Neurology  Diplomat, [place] of [employer] Medical Director, [place] [facility] [employer]

## 2018-05-20 ENCOUNTER — Other Ambulatory Visit: Payer: Self-pay

## 2018-05-20 ENCOUNTER — Ambulatory Visit (HOSPITAL_COMMUNITY): Payer: Medicare Other | Attending: Internal Medicine

## 2018-05-20 DIAGNOSIS — I5033 Acute on chronic diastolic (congestive) heart failure: Secondary | ICD-10-CM | POA: Diagnosis not present

## 2018-05-20 MED ORDER — PERFLUTREN LIPID MICROSPHERE: 1.0000 mL | INTRAVENOUS | Status: AC | PRN

## 2018-05-20 MED ORDER — PERFLUTREN LIPID MICROSPHERE
1.0000 mL | INTRAVENOUS | Status: AC | PRN
  Administered 2018-05-20: 2 mL via INTRAVENOUS

## 2018-05-24 ENCOUNTER — Telehealth: Payer: Self-pay | Admitting: Neurology

## 2018-05-24 DIAGNOSIS — G4733 Obstructive sleep apnea (adult) (pediatric): Secondary | ICD-10-CM

## 2018-05-24 DIAGNOSIS — Z72821 Inadequate sleep hygiene: Secondary | ICD-10-CM

## 2018-05-24 NOTE — Telephone Encounter (Signed)
-----   Message from Larey Seat, MD sent at 05/19/2018  1:58 PM EST ----- DIAGNOSIS 1. Obstructive Sleep Apnea and hypopnea is controlled under CPAP  at 7 cm water, with the use of the patient's own mask, he was  able to sleep non-supine which contributed to the lower AHI.  2. Overall , this patient has difficulties achieving sustained  sleep- poor sleep efficiency,    PLANS/RECOMMENDATIONS: Numerically, the AHI was well controlled  and the patient reached sustained sleep under 7 cm water CPAP,  but his subjective experience was that of poor quality sleep.   1. The patient should avoid the supine sleep position as much as  possible.   DISCUSSION: A 30 day trial period on CPAP 5-8 cm water with  Simplus mask is recommended. Alternatively , if this trial period  fails- If the patient can't get used to CPAP and/or needs  reminding him of using it, I would change to a likely less  effective dental device.

## 2018-05-24 NOTE — Telephone Encounter (Signed)
Called patient to discuss sleep study results. No answer at this time. LVM for the patient to call back.   

## 2018-05-25 ENCOUNTER — Encounter: Payer: Self-pay | Admitting: Neurology

## 2018-05-26 NOTE — Telephone Encounter (Signed)
Pt has returned the call to [facility] for results, he is asking for a call back

## 2018-05-26 NOTE — Telephone Encounter (Signed)
I called pt. I advised pt that Dr. Brett Fairy reviewed their sleep study results and found that pt was treated with CPAP at pressure of 7. Dr. Brett Fairy recommends that pt that he start a auto cpap 5-8 cm water pressure trial since he had hard time sleeping with it in the lab. PT states that he already has a machine that he uses and he is not quite due for a new machine through [facility]. He states it is set at a pressure of 12 cm water pressure. Pt has a auto sense CPAP and may have the capability to do auto. Spoke with Dr Brett Fairy and she would like to set it auto 5-12 cm water pressure.I will send this order over for the pt to [facility].  Pt requested for me to mail the patient his current sleep studies. Informed him I will send them in mail. Advised the patient to be in touch with [facility]. Pt verbalized understanding of results. Pt had no questions at this time but was encouraged to call back if questions arise. I have sent the order to [facility] and have received confirmation that they have received the order.

## 2018-05-26 NOTE — Addendum Note (Signed)
Addended by: Darleen Crocker on: 05/26/2018 04:35 PM   Modules accepted: Orders

## 2018-05-27 ENCOUNTER — Ambulatory Visit: Payer: Medicare Other | Admitting: *Deleted

## 2018-05-30 ENCOUNTER — Other Ambulatory Visit: Payer: Self-pay | Admitting: Neurology

## 2018-05-30 DIAGNOSIS — I480 Paroxysmal atrial fibrillation: Secondary | ICD-10-CM

## 2018-05-30 DIAGNOSIS — Z9114 Patient's other noncompliance with medication regimen: Secondary | ICD-10-CM

## 2018-05-30 DIAGNOSIS — G4733 Obstructive sleep apnea (adult) (pediatric): Secondary | ICD-10-CM

## 2018-05-30 DIAGNOSIS — G4719 Other hypersomnia: Secondary | ICD-10-CM

## 2018-05-30 DIAGNOSIS — Z72821 Inadequate sleep hygiene: Secondary | ICD-10-CM

## 2018-05-31 ENCOUNTER — Encounter: Payer: Self-pay | Admitting: Family Medicine

## 2018-06-02 ENCOUNTER — Ambulatory Visit: Payer: 59 | Admitting: Family Medicine

## 2018-06-02 ENCOUNTER — Other Ambulatory Visit: Payer: Self-pay | Admitting: Family Medicine

## 2018-06-02 ENCOUNTER — Ambulatory Visit: Payer: Medicare Other | Admitting: Family Medicine

## 2018-06-02 ENCOUNTER — Ambulatory Visit: Payer: 59 | Admitting: Physician Assistant

## 2018-06-02 ENCOUNTER — Encounter: Payer: Self-pay | Admitting: Family Medicine

## 2018-06-02 VITALS — BP 120/72 | HR 86 | Temp 97.6°F | Resp 18 | Ht 66.0 in | Wt 224.0 lb

## 2018-06-02 DIAGNOSIS — R05 Cough: Secondary | ICD-10-CM | POA: Diagnosis not present

## 2018-06-02 DIAGNOSIS — J40 Bronchitis, not specified as acute or chronic: Secondary | ICD-10-CM

## 2018-06-02 DIAGNOSIS — R059 Cough, unspecified: Secondary | ICD-10-CM

## 2018-06-02 DIAGNOSIS — J300 Vasomotor rhinitis: Secondary | ICD-10-CM

## 2018-06-02 MED ORDER — DOXYCYCLINE HYCLATE 100 MG PO CAPS: 100.0000 mg | ORAL_CAPSULE | Freq: Two times a day (BID) | ORAL | 0 refills | Status: DC

## 2018-06-02 MED ORDER — BECLOMETHASONE DIPROP HFA 80 MCG/ACT IN AERB: 1.0000 | INHALATION_SPRAY | Freq: Two times a day (BID) | RESPIRATORY_TRACT | 1 refills | Status: DC

## 2018-06-02 NOTE — Progress Notes (Signed)
[place] at [facility] [address], [place], [place] [address] [phone]  Date:  06/02/2018   Name:  [name]   DOB:  [birth year]   MRN:  [id number]  PCP:  Zachary Mclean, MD    Chief Complaint: Coughing (almost two weeks, worsening, )   History of Present Illness:  [name] is a 84 y.o. very pleasant male patient who presents with the following:  Here today with a cough for 2 weeks He was seen here on 10/30 with SOB: For the last 2-3 months, he also notes SOB and then sometimes CP following exertion. He will notice SOB with walking even across the house. If he were then to exert himself more(her gives an example of walking across the room and then getting into bed) he will have CP.  Rest, relaxing makes the pain go away in 2-3 minutes He thinks he had some a fib for a few minutes once, but otherwise has not felt any issues  He did have to use his nitro a couple of times - he feels like it helped  He last had sx yesterday, no CP currently  He is in the middle of a sleep evaluation and CPAP adjustment He is taking his xarelto  We had him see cardiology that day,per their note:  ASSESSMENT AND PLAN: 1.  Anginal chest pain: His symptoms are very concerning for angina.  However, his creatinine is significantly elevated. - An initial trial of medical therapy is indicated. -Start Imdur 30 mg daily and uptitrate as needed. -Continue beta-blocker and Zetia, encourage compliance with city of - Not on aspirin because of the Xarelto 2.  PAF: He has not had any symptoms that he thought were from atrial fibrillation. -Continue beta-blocker and anticoagulation 3.  Acute on chronic diastolic CHF: -His EF has been normal in the past, check an echo - Losartan HCTZ 100/12.5 is not keeping the fluid off. -Start Lasix 20 mg daily - However, I am not sure his blood pressure will tolerate both the addition of Imdur and  Lasix. -Therefore, split the losartan and HCTZ, discontinue the HCTZ for the Lasix and decrease the losartan to 50 mg daily. -he is to follow daily weights and stick to a low-sodium diet. 4.  Chronic kidney disease, stage III: - His creatinine was 2.010 in July 2019 - Check today and continue to follow. 5.  Hyperlipidemia: -His LDL was 75 and his HDL was 38 in December 2018 - Once his other medical issues are stabilized, recheck and decide on referring him for consideration of [place] or PCSK9 therapy, versus continuing Zetia and omega-3's  He is here today with a cough -"non stop cough" which has been painful  The cough is generally dry but he may bring up a bit of phlegm His wife notes that she got worried about his cough over the weekend  No fever noted  He is using albuterol prn and it does help some He does complain of some wheezing even with his albuterol He notes that he is not using his Imdur as it did not help him in the past and he was ?allergic/ intolerant to it He does have a follow-up cardiology appt with [facility] coming up in about 3 weeks.  Will message [name] that pt is not using imdur.  I don't know if they will want to do something different  We did a chest film on  10/30- ok  ?angina sx are not any different today.  Any SOB is muddied by this cough that he is experiencing   Accompanied by his wife who helps give the history today  Patient Active Problem List   Diagnosis Date Noted  . Angina pectoris, variant ([place]) 05/19/2018  . Poor compliance with CPAP treatment 04/20/2018  . Excessive daytime sleepiness 03/22/2018  . Poor sleep hygiene 03/22/2018  . Ecchymosis on examination 10/09/2017  . Ventral hernia without obstruction or gangrene 10/09/2017  . TIA (transient ischemic attack)   . Kidney stone   . Hypertension   . Hearing aid worn   . Coronary artery disease   . Asthma due to seasonal allergies   . Arthritis   . Erosive gastropathy 03/26/2017  . Acute  blood loss anemia 03/25/2017  . Melena 03/24/2017  . CKD (chronic kidney disease), stage III ([place]) 03/24/2017  . AF (paroxysmal atrial fibrillation) ([place]) 03/24/2017  . Angina pectoris ([place]) 10/09/2015  . Essential hypertension   . Hyperlipemia   . Carotid artery occlusion   . Dizziness 08/09/2015  . Gait instability 05/30/2015  . Hypothyroidism, acquired 10/26/2013  . OSA (obstructive sleep apnea) 10/18/2013  . Benign localized hyperplasia of prostate with urinary obstruction and other lower urinary tract symptoms (LUTS)(600.21) 12/21/2012  . Carcinoma in situ of prostate 12/21/2012  . Left shoulder pain 08/08/2012  . Obesity 07/24/2011  . Generalized anxiety disorder 07/24/2011  . DIVERTICULOSIS OF COLON 12/25/2009  . DERMATITIS, SEBORRHEIC 12/25/2009  . 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 Memory loss 08/14/2009  . VENOUS INSUFFICIENCY, CHRONIC 09/09/2006  . GASTROESOPHAGEAL REFLUX, NO ESOPHAGITIS 09/09/2006  . HERNIA, HIATAL, NONCONGENITAL 09/09/2006  . INSOMNIA NOS 09/09/2006    Past Medical History:  Diagnosis Date  . Anginal pain ([place])   . Anxiety    " OCCASIONAL"  . Arthritis   . Asthma due to seasonal allergies    uses inhalers prn  . Carotid artery occlusion    Left  . Complication of anesthesia    " had tremors after prostate surgery  . Coronary artery disease   . Diverticulitis    recurrent  . Family history of anesthesia complication    MOTHER   . GERD (gastroesophageal reflux disease)   . H/O hiatal hernia   . Hearing aid worn   . Hyperlipemia   . Hypertension   . Kidney stone    lithotripsy 2025 w complications, req stents, Dr Rosana Hoes  . Prostate CA ([place])   . Shortness of breath   . Thyroid disease   . TIA (transient ischemic attack)   . Tinnitus     Past Surgical History:  Procedure Laterality Date  . CARDIAC CATHETERIZATION  2008   minimal dz, Dr Cathie Olden  . CARDIAC CATHETERIZATION N/A 10/09/2015   Procedure: Left Heart Cath and Coronary  Angiography;  Surgeon: Peter M Martinique, MD;  Location: [facility];  Service: Cardiovascular;  Laterality: N/A;  . CARDIAC CATHETERIZATION N/A 10/09/2015   Procedure: Intravascular Pressure Wire/FFR Study;  Surgeon: Peter M Martinique, MD;  Location: [facility];  Service: Cardiovascular;  Laterality: N/A;  . CARDIAC CATHETERIZATION N/A 10/09/2015   Procedure: Coronary Stent Intervention;  Surgeon: Peter M Martinique, MD;  Location: [facility];  Service: Cardiovascular;  Laterality: N/A;  . CAROTID ENDARTERECTOMY  Left   1998  . CHOLECYSTECTOMY    . CORONARY STENT PLACEMENT  10/09/2015   DES to RCA  . ESOPHAGOGASTRODUODENOSCOPY (EGD) WITH PROPOFOL Left 03/26/2017  Procedure: ESOPHAGOGASTRODUODENOSCOPY (EGD) WITH PROPOFOL;  Surgeon: Arta Silence, MD;  Location: [facility];  Service: Endoscopy;  Laterality: Left;  . LEFT HEART CATHETERIZATION WITH CORONARY ANGIOGRAM N/A 09/15/2013   Procedure: LEFT HEART CATHETERIZATION WITH CORONARY ANGIOGRAM;  Surgeon: Peter M Martinique, MD;  [facility]: [facility];  Service: Cardiovascular;  Laterality: N/A;  . PROSTATECTOMY  1998   radical for prostate cancer    Social History   Tobacco Use  . Smoking status: Former Smoker    Packs/day: 2.50    Years: 30.00    Pack years: 75.00    Types: Cigarettes    Last attempt to quit: 08/28/1985    Years since quitting: 32.7  . Smokeless tobacco: Never Used  Substance Use Topics  . Alcohol use: Yes    Alcohol/week: 2.0 standard drinks    Types: 2 Standard drinks or equivalent per week    Comment: 1 glass wine/week (prior 1 glass martini with dinner)  . Drug use: No    Family History  Problem Relation Age of Onset  . Heart disease Mother   . Heart disease Father   . Kidney failure Father   . Heart disease Sister   . Heart attack Sister   . Dementia Sister   . Sjogren's syndrome Child   . Hashimoto's thyroiditis Child   . CAD Child     Allergies  Allergen Reactions  . Ciprofloxacin  Other (See Comments)    Hallucinations or jittery Hallucinations or jittery  . Codeine Other (See Comments)    hallucinations hallucinations  . Flexeril [Cyclobenzaprine]   . Imdur [Isosorbide Nitrate] Other (See Comments)    headache  . Methocarbamol   . Sulfa Antibiotics Other (See Comments)    Chills and shaking "serum sickness" Chills and shaking "serum sickness"  . Sulfamethoxazole Other (See Comments)    UNKNOWN REACTION, NOT DOCUMENTED  . Sulfonamide Derivatives Other (See Comments)    Chills and shaking "serum sickness"  . Flagyl [Metronidazole] Rash    Medication list has been reviewed and updated.  Current Outpatient Medications on File Prior to Visit  Medication Sig Dispense Refill  . albuterol (VENTOLIN HFA) 108 (90 Base) MCG/ACT inhaler Inhale 2 puffs into the lungs every 6 (six) hours as needed for wheezing or shortness of breath. 18 g 5  . Artificial Tear Solution (BION TEARS OP) Place 1 drop into both eyes daily as needed (dry eyes).    . carvedilol (COREG) 6.25 MG tablet TAKE 1 TABLET BY MOUTH TWICE A DAY WITH A MEAL 180 tablet 1  . clobetasol (TEMOVATE) 0.05 % external solution APPLY TO SCALP DAILY AS NEEDED  3  . ezetimibe (ZETIA) 10 MG tablet Take 1 tablet (10 mg total) by mouth daily. 90 tablet 1  . furosemide (LASIX) 20 MG tablet Take 1 tablet (20 mg total) by mouth daily. 90 tablet 2  . HYDROcodone-acetaminophen (NORCO/VICODIN) 5-325 MG tablet Take 0.5-1 tablets by mouth 2 (two) times daily as needed (pain). (Patient taking differently: Take 0.5-1 tablets by mouth once a week. ) 60 tablet 0  . hydrocortisone 2.5 % cream Apply 1 application topically 2 (two) times daily.    Marland Kitchen ipratropium (ATROVENT) 0.03 % nasal spray PLACE 2 SPRAYS INTO THE NOSE 4 (FOUR) TIMES DAILY. 30 mL 1  . levothyroxine (SYNTHROID, LEVOTHROID) 125 MCG tablet Take 1 tablet (125 mcg total) by mouth daily. 30 tablet 6  . liothyronine (CYTOMEL) 5 MCG tablet Take 1 tablet (5 mcg total) by mouth  every morning.  90 tablet 3  . losartan (COZAAR) 50 MG tablet Take 1 tablet (50 mg total) by mouth daily. 90 tablet 2  . nitroGLYCERIN (NITROLINGUAL) 0.4 MG/SPRAY spray Place 1 spray under the tongue every 5 (five) minutes x 3 doses as needed for chest pain. 12 g 12  . Omega-3 Fatty Acids (FISH OIL) 1200 MG CAPS Take 1 capsule (1,200 mg total) by mouth daily. 30 capsule 0  . polyethylene glycol (MIRALAX / GLYCOLAX) packet Take 17 g by mouth daily. Mix in 8 oz liquid and drink    . triamcinolone cream (KENALOG) 0.1 % Apply 1 application topically daily as needed (rash).    Alveda Reasons 15 MG TABS tablet TAKE 1 TABLET (15 MG TOTAL) BY MOUTH DAILY WITH SUPPER. NOTE DOSE CHANGE. 90 tablet 0  . isosorbide mononitrate (IMDUR) 30 MG 24 hr tablet Take 1 tablet (30 mg total) by mouth daily. (Patient not taking: Reported on 06/02/2018) 90 tablet 2   No current facility-administered medications on file prior to visit.     Review of Systems:  As per HPI- otherwise negative. BP Readings from Last 3 Encounters:  06/02/18 120/72  05/11/18 112/64  05/11/18 120/80   Wt Readings from Last 3 Encounters:  06/02/18 224 lb (101.6 kg)  05/11/18 226 lb (102.5 kg)  05/11/18 226 lb (102.5 kg)      Physical Examination: Vitals:   06/02/18 1118  BP: 120/72  Pulse: 86  Resp: 18  Temp: 97.6 F (36.4 C)  SpO2: 98%   Vitals:   06/02/18 1118  Weight: 224 lb (101.6 kg)  Height: 5\' 6"  (1.676 m)   Body mass index is 36.15 kg/m. Ideal Body Weight: Weight in (lb) to have BMI = 25: 154.6  GEN: WDWN, NAD, Non-toxic, A & O x 3, central obesity, looks well.  Significant HOH HEENT: Atraumatic, Normocephalic. Neck supple. No masses, No LAD.  Bilateral TM wnl, oropharynx normal.  PEERL,EOMI.   Ears and Nose: No external deformity. CV: RRR, No M/G/R. No JVD. No thrill. No extra heart sounds. PULM: CTA B, no wheezes, crackles, rhonchi. No retractions. No resp. distress. No accessory muscle use. ABD: S, NT, ND, +BS.  No rebound. No HSM. EXTR: No c/c/e NEURO Normal gait.  PSYCH: Normally interactive. Conversant. Not depressed or anxious appearing.  Calm demeanor.   BP Readings from Last 3 Encounters:  06/02/18 120/72  05/11/18 112/64  05/11/18 120/80    Assessment and Plan: Cough  Bronchitis - Plan: doxycycline (VIBRAMYCIN) 100 MG capsule, beclomethasone (QVAR REDIHALER) 80 MCG/ACT inhaler  Cough today- treat for bronchitis with doxycycline  He will continue prn albuterol Add qvar for a few weeks while he gets over this illness- it is exacerbating his baseline minor asthma sx If not helpful we can add an oral steroid but would prefer inhaled as few SE They will keep me closely updated as to his sx I sent a message to his cardiologist re: his not taking imdur    Signed Lamar Blinks, MD

## 2018-06-02 NOTE — Patient Instructions (Signed)
It was good to see you today- I hope that you feel better soon! Doxycycline twice a day for 10 days- antibiotic Qvar inhaler twice a day  Please let me know if you are not feeling better in the next few days- Sooner if worse.

## 2018-06-20 ENCOUNTER — Ambulatory Visit: Payer: Medicare Other | Admitting: Physician Assistant

## 2018-06-22 ENCOUNTER — Encounter: Payer: Self-pay | Admitting: Physician Assistant

## 2018-06-22 ENCOUNTER — Ambulatory Visit: Payer: Medicare Other | Admitting: Physician Assistant

## 2018-06-22 VITALS — BP 124/82 | HR 72 | Ht 67.0 in | Wt 230.6 lb

## 2018-06-22 DIAGNOSIS — I2510 Atherosclerotic heart disease of native coronary artery without angina pectoris: Secondary | ICD-10-CM | POA: Diagnosis not present

## 2018-06-22 DIAGNOSIS — Z8673 Personal history of transient ischemic attack (TIA), and cerebral infarction without residual deficits: Secondary | ICD-10-CM

## 2018-06-22 DIAGNOSIS — I6523 Occlusion and stenosis of bilateral carotid arteries: Secondary | ICD-10-CM

## 2018-06-22 DIAGNOSIS — I10 Essential (primary) hypertension: Secondary | ICD-10-CM

## 2018-06-22 DIAGNOSIS — I480 Paroxysmal atrial fibrillation: Secondary | ICD-10-CM

## 2018-06-22 DIAGNOSIS — N184 Chronic kidney disease, stage 4 (severe): Secondary | ICD-10-CM | POA: Diagnosis not present

## 2018-06-22 DIAGNOSIS — E785 Hyperlipidemia, unspecified: Secondary | ICD-10-CM

## 2018-06-22 NOTE — Patient Instructions (Signed)
Medication Instructions:  The current medical regimen is effective;  continue present plan and medications.  If you need a refill on your cardiac medications before your next appointment, please call your pharmacy.   Lab work: BMP today Fasting Lipid and Liver test before follow up visit. If you have labs (blood work) drawn today and your tests are completely normal, you will receive your results only by: Marland Kitchen MyChart Message (if you have MyChart) OR . A paper copy in the mail If you have any lab test that is abnormal or we need to change your treatment, we will call you to review the results.  Testing/Procedures: None Ordered.  Follow-Up: At [facility], you and your health needs are our priority.  As part of our continuing mission to provide you with exceptional heart care, we have created designated Provider Care Teams.  These Care Teams include your primary Cardiologist (physician) and Advanced Practice Providers (APPs -  Physician Assistants and Nurse Practitioners) who all work together to provide you with the care you need, when you need it. You will need a follow up appointment in 4-6 months.  Please call our office 2 months in advance to schedule this appointment.  You may see Peter Martinique, MD or one of the following Advanced Practice Providers on your designated Care Team: [place], [place] . Fabian Sharp, PA-C  Any Other Special Instructions Will Be Listed Below (If Applicable). Come for labs as a walk in right before follow up appointment with Dr.Jordan.

## 2018-06-22 NOTE — Progress Notes (Signed)
Cardiology Office Note    Date:  06/24/2018   ID:  [name], DOB Jan 06, 1934, MRN [id number]  PCP:  [name], MD  Cardiologist:  Dr. Martinique  Chief Complaint  Patient presents with  . Follow-up    seen for Dr. Martinique.     History of Present Illness:  [name] is a 84 y.o. male with PMH of CAD, prostate CA s/p radical prostatectomy 1998, HTN, HLD, h/o carotid artery disease s/p L CEA 1998, PAF, and TIA. He was admitted to the hospital in 2015 for atypical chest pain, he was ruled out for MI. Cardiac catheterization demonstrated 70% stenosis in proximal RCA with FFR of 0.82, it was felt this lesion was not flow-limiting therefore he was treated medically. In March 2017, he presented with class III angina despite good medical therapy, repeat cardiac catheterization demonstrated a 70% proximal RCA with FFR of 0.76, this lesion was stented with drug-eluting stent. He did develop generalized weakness in September 2016 with arthralgias, this was attributed to statin which was stopped. Now he is on Zetia. He is also on thyroid replacement therapy for hypothyroidism. He is followed by Dr. Scot King for Carotid artery disease and underwent left CEA. Evaluation in December 2016 showed 70-80% right ICA stenosis. He developed cellulitis of the right lower extremity in November 2017.   Patient was seen in the ED in September 2018 with atrial fibrillation and she was started on Xarelto at the time.  He returned in mid September 2018 with melena, Xarelto was held.  He was started on IV PPI.  Endoscopy revealed erosive gastritis.  It was recommended for the patient to resume Protonix twice a day indefinitely.  Patient was most recently seen by Zachary Ferries, PA-C on 05/11/2018 at which time he complained of exertional chest pain.  He was started on a trial of medical therapy was Imdur 30 mg daily.  He was felt to be mildly volume overloaded.  His losartan-HCTZ was stopped and he was started on  losartan and Lasix 20 mg daily.  Echocardiogram obtained on 05/20/2018 showed EF 60 to 65%, grade 1 DD, mild aortic stenosis, mild MR, RVSP 42 mmHg.  Patient presents today for cardiology office visit.  He is anginal symptom seems to be quite stable.  He is able to do everyday activity without significant chest discomfort but does notice chest pain with more extreme activity.  This has been stable for him for the past year.  He never started on the Imdur due to history of side effect associated with headache.  Given the stable nature of his angina, I recommended continued observation this time.  If his symptoms does worsen, we can consider additional ischemic work-up.   Past Medical History:  Diagnosis Date  . Anginal pain ([place])   . Anxiety    " OCCASIONAL"  . Arthritis   . Asthma due to seasonal allergies    uses inhalers prn  . Carotid artery occlusion    Left  . Complication of anesthesia    " had tremors after prostate surgery  . Coronary artery disease   . Diverticulitis    recurrent  . Family history of anesthesia complication    MOTHER   . GERD (gastroesophageal reflux disease)   . H/O hiatal hernia   . Hearing aid worn   . Hyperlipemia   . Hypertension   . Kidney stone    lithotripsy 4098 w complications, req stents, Dr [name]  . Prostate CA (  [facility])   . Shortness of breath   . Thyroid disease   . TIA (transient ischemic attack)   . Tinnitus     Past Surgical History:  Procedure Laterality Date  . CARDIAC CATHETERIZATION  2008   minimal dz, Dr [name]  . CARDIAC CATHETERIZATION N/A 10/09/2015   Procedure: Left Heart Cath and Coronary Angiography;  Surgeon: Zachary M Martinique, MD;  Location: [facility];  Service: Cardiovascular;  Laterality: N/A;  . CARDIAC CATHETERIZATION N/A 10/09/2015   Procedure: Intravascular Pressure Wire/FFR Study;  Surgeon: Zachary M Martinique, MD;  Location: [facility];  Service: Cardiovascular;  Laterality: N/A;  . CARDIAC CATHETERIZATION N/A  10/09/2015   Procedure: Coronary Stent Intervention;  Surgeon: Zachary M Martinique, MD;  Location: [facility];  Service: Cardiovascular;  Laterality: N/A;  . CAROTID ENDARTERECTOMY  Left   1998  . CHOLECYSTECTOMY    . CORONARY STENT PLACEMENT  10/09/2015   DES to RCA  . ESOPHAGOGASTRODUODENOSCOPY (EGD) WITH PROPOFOL Left 03/26/2017   Procedure: ESOPHAGOGASTRODUODENOSCOPY (EGD) WITH PROPOFOL;  Surgeon: Zachary Silence, MD;  Location: [facility];  Service: Endoscopy;  Laterality: Left;  . LEFT HEART CATHETERIZATION WITH CORONARY ANGIOGRAM N/A 09/15/2013   Procedure: LEFT HEART CATHETERIZATION WITH CORONARY ANGIOGRAM;  Surgeon: Zachary M Martinique, MD;  Location: [facility] C[facility];  Service: Cardiovascular;  Laterality: N/A;  . PROSTATECTOMY  1998   radical for prostate cancer    Current Medications: Outpatient Medications Prior to Visit  Medication Sig Dispense Refill  . albuterol (VENTOLIN HFA) 108 (90 Base) MCG/ACT inhaler Inhale 2 puffs into the lungs every 6 (six) hours as needed for wheezing or shortness of breath. 18 g 5  . Artificial Tear Solution (BION TEARS OP) Place 1 drop into both eyes daily as needed (dry eyes).    . beclomethasone (QVAR REDIHALER) 80 MCG/ACT inhaler Inhale 1 puff into the lungs 2 (two) times daily. 1 Inhaler 1  . carvedilol (COREG) 6.25 MG tablet TAKE 1 TABLET BY MOUTH TWICE A DAY WITH A MEAL 180 tablet 1  . clobetasol (TEMOVATE) 0.05 % external solution APPLY TO SCALP DAILY AS NEEDED  3  . ezetimibe (ZETIA) 10 MG tablet Take 1 tablet (10 mg total) by mouth daily. 90 tablet 1  . furosemide (LASIX) 20 MG tablet Take 1 tablet (20 mg total) by mouth daily. 90 tablet 2  . HYDROcodone-acetaminophen (NORCO/VICODIN) 5-325 MG tablet Take 0.5-1 tablets by mouth 2 (two) times daily as needed (pain). (Patient taking differently: Take 0.5-1 tablets by mouth once a week. ) 60 tablet 0  . hydrocortisone 2.5 % cream Apply 1 application topically 2 (two) times daily.    Marland Kitchen ipratropium  (ATROVENT) 0.03 % nasal spray PLACE 2 SPRAYS INTO THE NOSE 4 (FOUR) TIMES DAILY. 30 mL 1  . levothyroxine (SYNTHROID, LEVOTHROID) 125 MCG tablet Take 1 tablet (125 mcg total) by mouth daily. 30 tablet 6  . liothyronine (CYTOMEL) 5 MCG tablet Take 1 tablet (5 mcg total) by mouth every morning. 90 tablet 3  . losartan (COZAAR) 50 MG tablet Take 1 tablet (50 mg total) by mouth daily. 90 tablet 2  . nitroGLYCERIN (NITROLINGUAL) 0.4 MG/SPRAY spray Place 1 spray under the tongue every 5 (five) minutes x 3 doses as needed for chest pain. 12 g 12  . polyethylene glycol (MIRALAX / GLYCOLAX) packet Take 17 g by mouth daily. Mix in 8 oz liquid and drink    . triamcinolone cream (KENALOG) 0.1 % Apply 1 application topically  daily as needed (rash).    Alveda Reasons 15 MG TABS tablet TAKE 1 TABLET (15 MG TOTAL) BY MOUTH DAILY WITH SUPPER. NOTE DOSE CHANGE. 90 tablet 0  . isosorbide mononitrate (IMDUR) 30 MG 24 hr tablet Take 1 tablet (30 mg total) by mouth daily. 90 tablet 2  . doxycycline (VIBRAMYCIN) 100 MG capsule Take 1 capsule (100 mg total) by mouth 2 (two) times daily. (Patient not taking: Reported on 06/22/2018) 20 capsule 0  . ipratropium (ATROVENT) 0.03 % nasal spray PLACE 2 SPRAYS INTO THE NOSE 4 (FOUR) TIMES DAILY. (Patient not taking: Reported on 06/22/2018) 30 mL 5  . Omega-3 Fatty Acids (FISH OIL) 1200 MG CAPS Take 1 capsule (1,200 mg total) by mouth daily. (Patient not taking: Reported on 06/22/2018) 30 capsule 0   No facility-administered medications prior to visit.      Allergies:   Ciprofloxacin; Codeine; Flexeril [cyclobenzaprine]; Imdur [isosorbide nitrate]; Methocarbamol; Sulfa antibiotics; Sulfamethoxazole; Sulfonamide derivatives; and Flagyl [metronidazole]   Social History   Socioeconomic History  . Marital status: Married    Spouse name: Not on file  . Number of children: Not on file  . Years of education: Not on file  . Highest education level: Not on file  Occupational History  .  Occupation: retired    Comment: Actor [employer]  Social Needs  . Financial resource strain: Not on file  . Food insecurity:    Worry: Not on file    Inability: Not on file  . Transportation needs:    Medical: Not on file    Non-medical: Not on file  Tobacco Use  . Smoking status: Former Smoker    Packs/day: 2.50    Years: 30.00    Pack years: 75.00    Types: Cigarettes    Last attempt to quit: 08/28/1985    Years since quitting: 32.8  . Smokeless tobacco: Never Used  Substance and Sexual Activity  . Alcohol use: Yes    Alcohol/week: 2.0 standard drinks    Types: 2 Standard drinks or equivalent per week    Comment: 1 glass wine/week (prior 1 glass martini with dinner)  . Drug use: No  . Sexual activity: Yes    Birth control/protection: None  Lifestyle  . Physical activity:    Days per week: Not on file    Minutes per session: Not on file  . Stress: Not on file  Relationships  . Social connections:    Talks on phone: Not on file    Gets together: Not on file    Attends religious service: Not on file    Active member of club or organization: Not on file    Attends meetings of clubs or organizations: Not on file    Relationship status: Not on file  Other Topics Concern  . Not on file  Social History Narrative   Lives with wife--recently diagnosed with breast ca; No smoking; Occassional wine; Retired; 2 daughters live in [place]--5 grandchildren(boys). On weight watchers. Lives in [place] with wife. Retired Solicitor. Tobacco history 2 ppd x 32 years. No smoking x 25 years. No drugs.     Family History:  The patient's family history includes CAD in his child; Dementia in his sister; Hashimoto's thyroiditis in his child; Heart attack in his sister; Heart disease in his father, mother, and sister; Kidney failure in his father; Sjogren's syndrome in his child.   ROS:   Please see the history of present illness.    ROS  All other systems reviewed  and are negative.   PHYSICAL EXAM:   VS:  BP 124/82   Pulse 72   Ht 5\' 7"  (1.702 m)   Wt 230 lb 9.6 oz (104.6 kg)   BMI 36.12 kg/m    GEN: Well nourished, well developed, in no acute distress  HEENT: normal  Neck: no JVD, carotid bruits, or masses Cardiac: RRR; no murmurs, rubs, or gallops,no edema  Respiratory:  clear to auscultation bilaterally, normal work of breathing GI: soft, nontender, nondistended, + BS MS: no deformity or atrophy  Skin: warm and dry, no rash Neuro:  Alert and Oriented x 3, Strength and sensation are intact Psych: euthymic mood, full affect  Wt Readings from Last 3 Encounters:  06/22/18 230 lb 9.6 oz (104.6 kg)  06/02/18 224 lb (101.6 kg)  05/11/18 226 lb (102.5 kg)      Studies/Labs Reviewed:   EKG:  EKG is not ordered today.    Recent Labs: 01/24/2018: ALT 25; Hemoglobin 14.0; Platelets 147.0 04/21/2018: TSH 0.95 06/22/2018: BUN 18; Creatinine, Ser 1.67; Potassium 4.6; Sodium 140   Lipid Panel    Component Value Date/Time   CHOL 153 07/09/2017 0938   TRIG 198 (H) 07/09/2017 0938   HDL 38 (L) 07/09/2017 0938   CHOLHDL 4.0 07/09/2017 0938   CHOLHDL 3.3 03/24/2015 1159   VLDL 24 03/24/2015 1159   LDLCALC 75 07/09/2017 0938   LDLDIRECT 70 12/21/2012 1015    Additional studies/ records that were reviewed today include:   Cath 10/09/2015  Mid RCA lesion, 40% stenosed.  Prox LAD to Mid LAD lesion, 20% stenosed.  Ost 1st Mrg to 1st Mrg lesion, 40% stenosed.  The left ventricular systolic function is normal.  Prox RCA lesion, 70% stenosed. Post intervention, there is a 0% residual stenosis.   1. Single vessel obstructive CAD 2. Abnormal FFR of the proximal RCA lesion 3. Normal LV function 4. Successful stenting of the proximal RCA with a DES  Plan: DAPT for one year. Anticipate DC in am.     Echo 05/20/2018 LV EF: 60% -   65% Study Conclusions  - Left ventricle: The cavity size was normal. Systolic function was   normal.  The estimated ejection fraction was in the range of 60%   to 65%. Wall motion was normal; there were no regional wall   motion abnormalities. Doppler parameters are consistent with   abnormal left ventricular relaxation (grade 1 diastolic   dysfunction). Doppler parameters are consistent with high   ventricular filling pressure (E/e&' 22). - Aortic valve: Valve mobility was moderately restricted. There was   mild stenosis. There was no regurgitation. Mean gradient (S): 12   mm Hg. Valve area (VTI): 1.5 cm^2. Valve area (Vmax): 1.6 cm^2.   Valve area (Vmean): 1.48 cm^2. Maximum velocity and gradient   obtained from right sternal border. - Mitral valve: Moderately calcified annulus. Transvalvular   velocity was within the normal range. There was no evidence for   stenosis. There was mild regurgitation. Mean gradient (D): 3 mm   Hg (HR 61 bpm). - Left atrium: Poorly visualized. The atrium was moderately   dilated. - Right ventricle: Poorly visualized. The cavity size was normal.   Wall thickness was normal. Systolic function was normal. RV   systolic pressure (S, est): 42 mm Hg (using a right atrial   pressure estimate of 39mmHg. This maybe underestimated due to   poorly visualized IVC). - Right atrium: Poorly visualized. The atrium was mildly  dilated. - Tricuspid valve: There was trivial regurgitation. - Pulmonic valve: There was trivial regurgitation. - Pulmonary arteries: Systolic pressure was moderately increased. - Inferior vena cava: Poorly visualized. The vessel was mildly   dilated. - Pericardium, extracardiac: There was no pericardial effusion.  Impressions:  - Normal left ventricular function, EF 60-65%. Grossly normal   regiona wall motion. Elevated LV filling pressure. Mild aortic   stenosis, mean systolic gradient 12 mmHg. Moderate mitral annular   calcification. RVSP 42 mmHg, with RA pressure estimate of 10   mmHg, moderate pulmonary hypertension.  ASSESSMENT:     1. Coronary artery disease involving native coronary artery of native heart without angina pectoris   2. Stage 4 chronic kidney disease ([place])   3. Essential hypertension   4. Hyperlipidemia LDL goal <70   5. Bilateral carotid artery stenosis   6. PAF (paroxysmal atrial fibrillation) ([place])   7. H/O TIA (transient ischemic attack) and stroke      PLAN:  In order of problems listed above:  1. CAD: Since his previous cardiac cath as a June 2017, he has had chronic stable angina.  He does not have any exertional chest discomfort with everyday activity but does notice mild discomfort with more extreme activity.  This has not changed recently.  He never started on the Imdur due to side effect of headache.  I will discontinue Imdur at this time.  We will continue to observe his symptoms, if his chest discomfort increase in frequency or duration, we will consider ischemic work-up  2. Hypertension: Blood pressure stable on current therapy  3. Hyperlipidemia: Intolerant to statins.  Currently on Zetia.  4. Bilateral carotid artery disease: Followed by vascular surgery  5. Paroxysmal atrial fibrillation: Continue on Xarelto 15 mg daily due to renal function, rate controlled using carvedilol  6. History of TIA: No recurrence    Medication Adjustments/Labs and Tests Ordered: Current medicines are reviewed at length with the patient today.  Concerns regarding medicines are outlined above.  Medication changes, Labs and Tests ordered today are listed in the Patient Instructions below. Patient Instructions  Medication Instructions:  The current medical regimen is effective;  continue present plan and medications.  If you need a refill on your cardiac medications before your next appointment, please call your pharmacy.   Lab work: BMP today Fasting Lipid and Liver test before follow up visit. If you have labs (blood work) drawn today and your tests are completely normal, you will receive your  results only by: Marland Kitchen MyChart Message (if you have MyChart) OR . A paper copy in the mail If you have any lab test that is abnormal or we need to change your treatment, we will call you to review the results.  Testing/Procedures: None Ordered.  Follow-Up: At [facility], you and your health needs are our priority.  As part of our continuing mission to provide you with exceptional heart care, we have created designated Provider Care Teams.  These Care Teams include your primary Cardiologist (physician) and Advanced Practice Providers (APPs -  Physician Assistants and Nurse Practitioners) who all work together to provide you with the care you need, when you need it. You will need a follow up appointment in 4-6 months.  Please call our office 2 months in advance to schedule this appointment.  You may see Zachary Martinique, MD or one of the following Advanced Practice Providers on your designated Care Team: [place], [place] . Fabian Sharp, PA-C  Any Other Special Instructions  Will Be Listed Below (If Applicable). Come for labs as a walk in right before follow up appointment with Dr.Jordan.       [name], [place]  06/24/2018 11:09 PM    [address], [place], [place]  [address] Phone: [phone]; Fax: [phone]

## 2018-06-23 LAB — BASIC METABOLIC PANEL
BUN/Creatinine Ratio: 11 (ref 10–24)
BUN: 18 mg/dL (ref 8–27)
CALCIUM: 9.2 mg/dL (ref 8.6–10.2)
CHLORIDE: 102 mmol/L (ref 96–106)
CO2: 25 mmol/L (ref 20–29)
Creatinine, Ser: 1.67 mg/dL — ABNORMAL HIGH (ref 0.76–1.27)
GFR calc Af Amer: 43 mL/min/{1.73_m2} — ABNORMAL LOW (ref >59)
GFR, EST NON AFRICAN AMERICAN: 38 mL/min/{1.73_m2} — AB (ref >59)
Glucose: 94 mg/dL (ref 65–99)
Potassium: 4.6 mmol/L (ref 3.5–5.2)
SODIUM: 140 mmol/L (ref 134–144)

## 2018-06-24 ENCOUNTER — Encounter: Payer: Self-pay | Admitting: Physician Assistant

## 2018-06-27 ENCOUNTER — Other Ambulatory Visit: Payer: Self-pay | Admitting: Neurology

## 2018-06-27 ENCOUNTER — Other Ambulatory Visit: Payer: Self-pay | Admitting: Cardiology

## 2018-06-27 DIAGNOSIS — G4733 Obstructive sleep apnea (adult) (pediatric): Secondary | ICD-10-CM

## 2018-06-27 DIAGNOSIS — Z9114 Patient's other noncompliance with medication regimen: Secondary | ICD-10-CM

## 2018-06-27 DIAGNOSIS — G4719 Other hypersomnia: Secondary | ICD-10-CM

## 2018-06-27 NOTE — Progress Notes (Signed)
Kidney function continue to improve slightly. Electrolyte ok

## 2018-07-18 ENCOUNTER — Other Ambulatory Visit: Payer: Self-pay

## 2018-07-18 DIAGNOSIS — I2510 Atherosclerotic heart disease of native coronary artery without angina pectoris: Secondary | ICD-10-CM

## 2018-07-18 MED ORDER — NITROGLYCERIN 0.4 MG/SPRAY TL SOLN: 1.0000 | 5 refills | Status: DC | PRN

## 2018-07-18 NOTE — Telephone Encounter (Signed)
Rx(s) sent to pharmacy electronically.  

## 2018-09-07 ENCOUNTER — Other Ambulatory Visit: Payer: Self-pay

## 2018-09-07 DIAGNOSIS — E039 Hypothyroidism, unspecified: Secondary | ICD-10-CM

## 2018-09-07 MED ORDER — LEVOTHYROXINE SODIUM 125 MCG PO TABS: 125.0000 ug | ORAL_TABLET | Freq: Every day | ORAL | 1 refills | Status: DC

## 2018-09-09 ENCOUNTER — Other Ambulatory Visit: Payer: Self-pay | Admitting: *Deleted

## 2018-09-09 MED ORDER — RIVAROXABAN 15 MG PO TABS: ORAL_TABLET | ORAL | 0 refills | Status: DC

## 2018-09-09 NOTE — Progress Notes (Signed)
[place] at [facility] [address], [place], [place] [address] [phone]  Date:  09/14/2018   Name:  [name]   DOB:  12-17-1933   MRN:  [id number]  PCP:  [name], MD    Chief Complaint: Annual Exam   History of Present Illness:  [name] is a 85 y.o. very pleasant male patient who presents with the following:  Here today for a CPE History of TIA, hypertension, CAD, prostate cancer, chronic kidney disease,hypothyroidism For his thyroid, he has typically liked to take both Synthroid and Cytomel in combination  Last PSA - per his urologist Dr. Rosana Hoes with [facility].  S/p radical prostatectomy in 1998 Last saw Dr. Rosana Hoes in April-  Seen by his cardiologist in [place], [place] He is also a neurology pt for his [facility]- [facility].  Last seen I think in July- CKD 3. Most recent creat 1.67  Per last cardiology note:  1. CAD: Since his previous cardiac cath as a June 2017, he has had chronic stable angina.  He does not have any exertional chest discomfort with everyday activity but does notice mild discomfort with more extreme activity.  This has not changed recently.  He never started on the Imdur due to side effect of headache.  I will discontinue Imdur at this time.  We will continue to observe his symptoms, if his chest discomfort increase in frequency or duration, we will consider ischemic work-up 2. Hypertension: Blood pressure stable on current therapy 3. Hyperlipidemia: Intolerant to statins.  Currently on Zetia. 4. Bilateral carotid artery disease: Followed by vascular surgery 5. Paroxysmal atrial fibrillation: Continue on Xarelto 15 mg daily due to renal function, rate controlled using carvedilol 6. History of TIA: No recurrence  Labs: Due today, he is fasting today  Immun: can suggest shingrix  Colon:  Upper GI per Dr. Tawny Hopping 2018. Pt reports that he was told by GI to stop screening at age  33  Carvedilol Zetia Lasix 20 daily- he was able to stop using this  Synthroid 125 Cytomel 5 mcg- he stopped taking this actually.  Stopped about a month ago Losartan 50 Nitroglycerin as needed Xarelto 15- he will bleed easily if any scratch, no major bleeding  His wife is here and contributes to the history They notice that his legs seem to be weak- it takes him a while to get up from a chair and get going When he first does walking he will be very slow, speeds up as he goes.  However he is not doing much physical activity at all, walking only a little bit around the  He is taking a nap in the am and pm right now - he notes that he gets so tired that he cannot resist a nap He is using his CPAP   He does not drive - should qualify for home PT  . Lab Results  Component Value Date   TSH 0.95 04/21/2018   Wt Readings from Last 3 Encounters:  09/14/18 230 lb (104.3 kg)  06/22/18 230 lb 9.6 oz (104.6 kg)  06/02/18 224 lb (101.6 kg)   BP Readings from Last 3 Encounters:  09/14/18 136/80  06/22/18 124/82  06/02/18 120/72   He notes that his appetite is ok His memory continues to get worse  Patient Active Problem List   Diagnosis Date Noted  . Angina pectoris, variant ([place]) 05/19/2018  . Poor compliance with  CPAP treatment 04/20/2018  . Excessive daytime sleepiness 03/22/2018  . Poor sleep hygiene 03/22/2018  . Ventral hernia without obstruction or gangrene 10/09/2017  . TIA (transient ischemic attack)   . Kidney stone   . Hypertension   . Hearing aid worn   . Coronary artery disease   . Asthma due to seasonal allergies   . Arthritis   . Erosive gastropathy 03/26/2017  . Acute blood loss anemia 03/25/2017  . Melena 03/24/2017  . CKD (chronic kidney disease), stage III ([place]) 03/24/2017  . AF (paroxysmal atrial fibrillation) ([place]) 03/24/2017  . Angina pectoris ([place]) 10/09/2015  . Essential hypertension   . Hyperlipemia   . Carotid artery occlusion   . Dizziness  08/09/2015  . Gait instability 05/30/2015  . Hypothyroidism, acquired 10/26/2013  . OSA (obstructive sleep apnea) 10/18/2013  . Benign localized hyperplasia of prostate with urinary obstruction and other lower urinary tract symptoms (LUTS)(600.21) 12/21/2012  . Carcinoma in situ of prostate 12/21/2012  . Left shoulder pain 08/08/2012  . Obesity 07/24/2011  . Generalized anxiety disorder 07/24/2011  . DIVERTICULOSIS OF COLON 12/25/2009  . DERMATITIS, SEBORRHEIC 12/25/2009  . 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 Memory loss 08/14/2009  . VENOUS INSUFFICIENCY, CHRONIC 09/09/2006  . GASTROESOPHAGEAL REFLUX, NO ESOPHAGITIS 09/09/2006  . HERNIA, HIATAL, NONCONGENITAL 09/09/2006  . INSOMNIA NOS 09/09/2006    Past Medical History:  Diagnosis Date  . Anginal pain ([place])   . Anxiety    " OCCASIONAL"  . Arthritis   . Asthma due to seasonal allergies    uses inhalers prn  . Carotid artery occlusion    Left  . Complication of anesthesia    " had tremors after prostate surgery  . Coronary artery disease   . Diverticulitis    recurrent  . Family history of anesthesia complication    MOTHER   . GERD (gastroesophageal reflux disease)   . H/O hiatal hernia   . Hearing aid worn   . Hyperlipemia   . Hypertension   . Kidney stone    lithotripsy 2025 w complications, req stents, Dr Rosana Hoes  . Prostate CA ([place])   . Shortness of breath   . Thyroid disease   . TIA (transient ischemic attack)   . Tinnitus     Past Surgical History:  Procedure Laterality Date  . CARDIAC CATHETERIZATION  2008   minimal dz, Dr Cathie Olden  . CARDIAC CATHETERIZATION N/A 10/09/2015   Procedure: Left Heart Cath and Coronary Angiography;  Surgeon: Peter M Martinique, MD;  Location: [facility];  Service: Cardiovascular;  Laterality: N/A;  . CARDIAC CATHETERIZATION N/A 10/09/2015   Procedure: Intravascular Pressure Wire/FFR Study;  Surgeon: Peter M Martinique, MD;  Location: [facility];  Service:  Cardiovascular;  Laterality: N/A;  . CARDIAC CATHETERIZATION N/A 10/09/2015   Procedure: Coronary Stent Intervention;  Surgeon: Peter M Martinique, MD;  Location: [facility];  Service: Cardiovascular;  Laterality: N/A;  . CAROTID ENDARTERECTOMY  Left   1998  . CHOLECYSTECTOMY    . CORONARY STENT PLACEMENT  10/09/2015   DES to RCA  . ESOPHAGOGASTRODUODENOSCOPY (EGD) WITH PROPOFOL Left 03/26/2017   Procedure: ESOPHAGOGASTRODUODENOSCOPY (EGD) WITH PROPOFOL;  Surgeon: Arta Silence, MD;  Location: [facility];  Service: Endoscopy;  Laterality: Left;  . LEFT HEART CATHETERIZATION WITH CORONARY ANGIOGRAM N/A 09/15/2013   Procedure: LEFT HEART CATHETERIZATION WITH CORONARY ANGIOGRAM;  Surgeon: Peter M Martinique, MD;  Location: [facility] CATH LAB;  Service: Cardiovascular;  Laterality: N/A;  . PROSTATECTOMY  1998   radical for prostate cancer    Social History   Tobacco Use  . Smoking status: Former Smoker    Packs/day: 2.50    Years: 30.00    Pack years: 75.00    Types: Cigarettes    Last attempt to quit: 08/28/1985    Years since quitting: 33.0  . Smokeless tobacco: Never Used  Substance Use Topics  . Alcohol use: Yes    Alcohol/week: 2.0 standard drinks    Types: 2 Standard drinks or equivalent per week    Comment: 1 glass wine/week (prior 1 glass martini with dinner)  . Drug use: No    Family History  Problem Relation Age of Onset  . Heart disease Mother   . Heart disease Father   . Kidney failure Father   . Heart disease Sister   . Heart attack Sister   . Dementia Sister   . Sjogren's syndrome Child   . Hashimoto's thyroiditis Child   . CAD Child     Allergies  Allergen Reactions  . Ciprofloxacin Other (See Comments)    Hallucinations or jittery Hallucinations or jittery  . Codeine Other (See Comments)    hallucinations hallucinations  . Flexeril [Cyclobenzaprine]   . Imdur [Isosorbide Nitrate] Other (See Comments)    headache  . Methocarbamol   . Sulfa Antibiotics  Other (See Comments)    Chills and shaking "serum sickness" Chills and shaking "serum sickness"  . Sulfamethoxazole Other (See Comments)    UNKNOWN REACTION, NOT DOCUMENTED  . Sulfonamide Derivatives Other (See Comments)    Chills and shaking "serum sickness"  . Flagyl [Metronidazole] Rash    Medication list has been reviewed and updated.  Current Outpatient Medications on File Prior to Visit  Medication Sig Dispense Refill  . albuterol (VENTOLIN HFA) 108 (90 Base) MCG/ACT inhaler Inhale 2 puffs into the lungs every 6 (six) hours as needed for wheezing or shortness of breath. 18 g 5  . Artificial Tear Solution (BION TEARS OP) Place 1 drop into both eyes daily as needed (dry eyes).    . beclomethasone (QVAR REDIHALER) 80 MCG/ACT inhaler Inhale 1 puff into the lungs 2 (two) times daily. 1 Inhaler 1  . carvedilol (COREG) 6.25 MG tablet TAKE 1 TABLET BY MOUTH TWICE A DAY WITH A MEAL 180 tablet 1  . clobetasol (TEMOVATE) 0.05 % external solution APPLY TO SCALP DAILY AS NEEDED  3  . ezetimibe (ZETIA) 10 MG tablet TAKE 1 TABLET BY MOUTH EVERY DAY 90 tablet 1  . HYDROcodone-acetaminophen (NORCO/VICODIN) 5-325 MG tablet Take 0.5-1 tablets by mouth 2 (two) times daily as needed (pain). (Patient taking differently: Take 0.5-1 tablets by mouth once a week. ) 60 tablet 0  . hydrocortisone 2.5 % cream Apply 1 application topically 2 (two) times daily.    Marland Kitchen ipratropium (ATROVENT) 0.03 % nasal spray PLACE 2 SPRAYS INTO THE NOSE 4 (FOUR) TIMES DAILY. 30 mL 1  . levothyroxine (SYNTHROID, LEVOTHROID) 125 MCG tablet Take 1 tablet (125 mcg total) by mouth daily. 90 tablet 1  . liothyronine (CYTOMEL) 5 MCG tablet Take 1 tablet (5 mcg total) by mouth every morning. 90 tablet 3  . nitroGLYCERIN (NITROLINGUAL) 0.4 MG/SPRAY spray Place 1 spray under the tongue every 5 (five) minutes x 3 doses as needed for chest pain. 12 g 5  . polyethylene glycol (MIRALAX / GLYCOLAX) packet Take 17 g by mouth daily. Mix in 8 oz  liquid and drink    . Rivaroxaban (XARELTO) 15 MG  TABS tablet TAKE 1 TABLET (15 MG TOTAL) BY MOUTH DAILY WITH SUPPER. NOTE DOSE CHANGE. 30 tablet 0  . triamcinolone cream (KENALOG) 0.1 % Apply 1 application topically daily as needed (rash).    . furosemide (LASIX) 20 MG tablet Take 1 tablet (20 mg total) by mouth daily. (Patient not taking: Reported on 09/14/2018) 90 tablet 2  . losartan (COZAAR) 50 MG tablet Take 1 tablet (50 mg total) by mouth daily. 90 tablet 2   No current facility-administered medications on file prior to visit.     Review of Systems:  As per HPI- otherwise negative. Besides being tired, he is generally feeling okay  Physical Examination: Vitals:   09/14/18 1040  BP: 136/80  Pulse: 67  Resp: 20  Temp: 97.8 F (36.6 C)  SpO2: 98%   Vitals:   09/14/18 1040  Weight: 230 lb (104.3 kg)  Height: 5\' 7"  (1.702 m)   Body mass index is 36.02 kg/m. Ideal Body Weight: Weight in (lb) to have BMI = 25: 159.3  GEN: WDWN, NAD, Non-toxic, A & O x 3, obese, hard of hearing HEENT: Atraumatic, Normocephalic. Neck supple. No masses, No LAD.  Bilateral TM wnl, oropharynx normal.  PEERL,EOMI. uses hearing aids Ears and Nose: No external deformity. CV: RRR, No M/G/R. No JVD. No thrill. No extra heart sounds. PULM: CTA B, no wheezes, crackles, rhonchi. No retractions. No resp. distress. No accessory muscle use. ABD: S, NT, ND. No rebound. No HSM. EXTR: No c/c/e NEURO Normal gait for patient, quite slow.  Using a cane  He is able to climb 1 step onto my exam table PSYCH: Normally interactive. Conversant. Not depressed or anxious appearing.  Calm demeanor.    Assessment and Plan: Essential hypertension - Plan: CBC, Comprehensive metabolic panel  CKD (chronic kidney disease) stage 3, GFR 30-59 ml/min (HCC)  Memory loss  Paroxysmal atrial fibrillation ([place]) - Plan: CBC, Comprehensive metabolic panel  History of prostate cancer - Plan: PSA  TIA (transient ischemic  attack)  OSA (obstructive sleep apnea)  Hyperlipidemia, unspecified hyperlipidemia type - Plan: Lipid panel  Elevated glucose - Plan: Hemoglobin A1c  Hypothyroidism, acquired - Plan: TSH  Physical deconditioning - Plan: Ambulatory referral to Home Health  Here today for complete physical He has complaint of some urinary symptoms.  His urologist is Dr. Rosana Hoes, last seen about 1 year ago.  Encouraged him to schedule follow-up appointment He also notes that he is often very tired.  He uses CPAP per neurology. Encouraged him to follow-up with neurology to make sure that his CPAP is working properly.  Can also discuss his memory with his neurologist   Encourage shingles vaccine He is now taking only levothyroxine, check TSH today   Signed Lamar Blinks, MD  Received his labs, message to pt   Results for orders placed or performed in visit on 09/14/18  CBC  Result Value Ref Range   WBC 5.9 4.0 - 10.5 K/uL   RBC 4.07 (L) 4.22 - 5.81 Mil/uL   Platelets 147.0 (L) 150.0 - 400.0 K/uL   Hemoglobin 14.6 13.0 - 17.0 g/dL   HCT 42.3 39.0 - 52.0 %   MCV 104.1 (H) 78.0 - 100.0 fl   MCHC 34.5 30.0 - 36.0 g/dL   RDW 14.9 11.5 - 15.5 %  Comprehensive metabolic panel  Result Value Ref Range   Sodium 138 135 - 145 mEq/L   Potassium 4.9 3.5 - 5.1 mEq/L   Chloride 102 96 - 112 mEq/L  CO2 30 19 - 32 mEq/L   Glucose, Bld 105 (H) 70 - 99 mg/dL   BUN 20 6 - 23 mg/dL   Creatinine, Ser 1.74 (H) 0.40 - 1.50 mg/dL   Total Bilirubin 2.0 (H) 0.2 - 1.2 mg/dL   Alkaline Phosphatase 66 39 - 117 U/L   AST 26 0 - 37 U/L   ALT 34 0 - 53 U/L   Total Protein 7.0 6.0 - 8.3 g/dL   Albumin 4.2 3.5 - 5.2 g/dL   Calcium 9.4 8.4 - 10.5 mg/dL   GFR 37.65 (L) >60.00 mL/min  Hemoglobin A1c  Result Value Ref Range   Hgb A1c MFr Bld 5.3 4.6 - 6.5 %  Lipid panel  Result Value Ref Range   Cholesterol 154 0 - 200 mg/dL   Triglycerides 195.0 (H) 0.0 - 149.0 mg/dL   HDL 37.40 (L) >39.00 mg/dL   VLDL 39.0 0.0 -  40.0 mg/dL   LDL Cholesterol 77 0 - 99 mg/dL   Total CHOL/HDL Ratio 4    NonHDL 116.28   PSA  Result Value Ref Range   PSA 0.00 (L) 0.10 - 4.00 ng/mL  TSH  Result Value Ref Range   TSH 5.70 (H) 0.35 - 4.50 uIU/mL

## 2018-09-09 NOTE — Telephone Encounter (Addendum)
Pt is a 85 yr old male who saw the PA on 06/22/18, wt. at that was 104.6Kg. SCr on 06/22/18 was 1.67. CrCl is 32mL/min. Will continue 15mg , waiting for repeat labs. Will send 30 day supply.

## 2018-09-13 NOTE — Patient Instructions (Addendum)
It was very nice to see you today, I will be in touch with your labs  You might want to follow-up with urology about your urinary sx- you see Dr. Rosana Hoes with [address], [place]  [place], [place] [address]  [phone]  [phone] (Fax)     You might also follow-up with neurology regarding your CPAP- to make sure it is working  Neurology can also talk to you about your memory if you like   Consider getting the shingles vaccine at your drug store  I am going to order home health physical therapy to come to you and work on your strength/ getting up from a chair and walking    Health Maintenance After Age 47 After age 62, you are at a higher risk for certain long-term diseases and infections as well as injuries from falls. Falls are a major cause of broken bones and head injuries in people who are older than age 37. Getting regular preventive care can help to keep you healthy and well. Preventive care includes getting regular testing and making lifestyle changes as recommended by your health care provider. Talk with your health care provider about:  Which screenings and tests you should have. A screening is a test that checks for a disease when you have no symptoms.  A diet and exercise plan that is right for you. What should I know about screenings and tests to prevent falls? Screening and testing are the best ways to find a health problem early. Early diagnosis and treatment give you the best chance of managing medical conditions that are common after age 76. Certain conditions and lifestyle choices may make you more likely to have a fall. Your health care provider may recommend:  Regular vision checks. Poor vision and conditions such as cataracts can make you more likely to have a fall. If you wear glasses, make sure to get your prescription updated if your vision changes.  Medicine review. Work with your health care provider to regularly review all of  the medicines you are taking, including over-the-counter medicines. Ask your health care provider about any side effects that may make you more likely to have a fall. Tell your health care provider if any medicines that you take make you feel dizzy or sleepy.  Osteoporosis screening. Osteoporosis is a condition that causes the bones to get weaker. This can make the bones weak and cause them to break more easily.  Blood pressure screening. Blood pressure changes and medicines to control blood pressure can make you feel dizzy.  Strength and balance checks. Your health care provider may recommend certain tests to check your strength and balance while standing, walking, or changing positions.  Foot health exam. Foot pain and numbness, as well as not wearing proper footwear, can make you more likely to have a fall.  Depression screening. You may be more likely to have a fall if you have a fear of falling, feel emotionally low, or feel unable to do activities that you used to do.  Alcohol use screening. Using too much alcohol can affect your balance and may make you more likely to have a fall. What actions can I take to lower my risk of falls? General instructions  Talk with your health care provider about your risks for falling. Tell your health care provider if: ? You fall. Be sure to tell your health care provider about all falls, even ones that seem minor. ? You feel  dizzy, sleepy, or off-balance.  Take over-the-counter and prescription medicines only as told by your health care provider. These include any supplements.  Eat a healthy diet and maintain a healthy weight. A healthy diet includes low-fat dairy products, low-fat (lean) meats, and fiber from whole grains, beans, and lots of fruits and vegetables. Home safety  Remove any tripping hazards, such as rugs, cords, and clutter.  Install safety equipment such as grab bars in bathrooms and safety rails on stairs.  Keep rooms and walkways  well-lit. Activity   Follow a regular exercise program to stay fit. This will help you maintain your balance. Ask your health care provider what types of exercise are appropriate for you.  If you need a cane or walker, use it as recommended by your health care provider.  Wear supportive shoes that have nonskid soles. Lifestyle  Do not drink alcohol if your health care provider tells you not to drink.  If you drink alcohol, limit how much you have: ? 0-1 drink a day for women. ? 0-2 drinks a day for men.  Be aware of how much alcohol is in your drink. In the [place] one drink equals one typical bottle of beer (12 oz), one-half glass of wine (5 oz), or one shot of hard liquor (1 oz).  Do not use any products that contain nicotine or tobacco, such as cigarettes and e-cigarettes. If you need help quitting, ask your health care provider. Summary  Having a healthy lifestyle and getting preventive care can help to protect your health and wellness after age 20.  Screening and testing are the best way to find a health problem early and help you avoid having a fall. Early diagnosis and treatment give you the best chance for managing medical conditions that are more common for people who are older than age 37.  Falls are a major cause of broken bones and head injuries in people who are older than age 76. Take precautions to prevent a fall at home.  Work with your health care provider to learn what changes you can make to improve your health and wellness and to prevent falls. This information is not intended to replace advice given to you by your health care provider. Make sure you discuss any questions you have with your health care provider. Document Released: 05/12/2017 Document Revised: 05/12/2017 Document Reviewed: 05/12/2017 Elsevier Interactive Patient Education  2019 [employer].

## 2018-09-14 ENCOUNTER — Encounter: Payer: Self-pay | Admitting: Family Medicine

## 2018-09-14 ENCOUNTER — Ambulatory Visit (INDEPENDENT_AMBULATORY_CARE_PROVIDER_SITE_OTHER): Payer: Medicare Other | Admitting: Family Medicine

## 2018-09-14 VITALS — BP 136/80 | HR 67 | Temp 97.8°F | Resp 20 | Ht 67.0 in | Wt 230.0 lb

## 2018-09-14 DIAGNOSIS — E785 Hyperlipidemia, unspecified: Secondary | ICD-10-CM

## 2018-09-14 DIAGNOSIS — I480 Paroxysmal atrial fibrillation: Secondary | ICD-10-CM

## 2018-09-14 DIAGNOSIS — N183 Chronic kidney disease, stage 3 unspecified: Secondary | ICD-10-CM

## 2018-09-14 DIAGNOSIS — R7309 Other abnormal glucose: Secondary | ICD-10-CM

## 2018-09-14 DIAGNOSIS — R5381 Other malaise: Secondary | ICD-10-CM

## 2018-09-14 DIAGNOSIS — D7589 Other specified diseases of blood and blood-forming organs: Secondary | ICD-10-CM

## 2018-09-14 DIAGNOSIS — G459 Transient cerebral ischemic attack, unspecified: Secondary | ICD-10-CM

## 2018-09-14 DIAGNOSIS — I10 Essential (primary) hypertension: Secondary | ICD-10-CM

## 2018-09-14 DIAGNOSIS — Z8546 Personal history of malignant neoplasm of prostate: Secondary | ICD-10-CM

## 2018-09-14 DIAGNOSIS — R413 Other amnesia: Secondary | ICD-10-CM

## 2018-09-14 DIAGNOSIS — E039 Hypothyroidism, unspecified: Secondary | ICD-10-CM | POA: Diagnosis not present

## 2018-09-14 DIAGNOSIS — G4733 Obstructive sleep apnea (adult) (pediatric): Secondary | ICD-10-CM

## 2018-09-14 LAB — LIPID PANEL
Cholesterol: 154 mg/dL (ref 0–200)
HDL: 37.4 mg/dL — ABNORMAL LOW (ref >39.00)
LDL Cholesterol: 77 mg/dL (ref 0–99)
NonHDL: 116.28
Total CHOL/HDL Ratio: 4
Triglycerides: 195 mg/dL — ABNORMAL HIGH (ref 0.0–149.0)
VLDL: 39 mg/dL (ref 0.0–40.0)

## 2018-09-14 LAB — COMPREHENSIVE METABOLIC PANEL
ALBUMIN: 4.2 g/dL (ref 3.5–5.2)
ALT: 34 U/L (ref 0–53)
AST: 26 U/L (ref 0–37)
Alkaline Phosphatase: 66 U/L (ref 39–117)
BUN: 20 mg/dL (ref 6–23)
CO2: 30 mEq/L (ref 19–32)
Calcium: 9.4 mg/dL (ref 8.4–10.5)
Chloride: 102 mEq/L (ref 96–112)
Creatinine, Ser: 1.74 mg/dL — ABNORMAL HIGH (ref 0.40–1.50)
GFR: 37.65 mL/min — ABNORMAL LOW (ref >60.00)
Glucose, Bld: 105 mg/dL — ABNORMAL HIGH (ref 70–99)
Potassium: 4.9 mEq/L (ref 3.5–5.1)
Sodium: 138 mEq/L (ref 135–145)
Total Bilirubin: 2 mg/dL — ABNORMAL HIGH (ref 0.2–1.2)
Total Protein: 7 g/dL (ref 6.0–8.3)

## 2018-09-14 LAB — CBC
HCT: 42.3 % (ref 39.0–52.0)
Hemoglobin: 14.6 g/dL (ref 13.0–17.0)
MCHC: 34.5 g/dL (ref 30.0–36.0)
MCV: 104.1 fl — ABNORMAL HIGH (ref 78.0–100.0)
Platelets: 147 10*3/uL — ABNORMAL LOW (ref 150.0–400.0)
RBC: 4.07 Mil/uL — AB (ref 4.22–5.81)
RDW: 14.9 % (ref 11.5–15.5)
WBC: 5.9 10*3/uL (ref 4.0–10.5)

## 2018-09-14 LAB — HEMOGLOBIN A1C: Hgb A1c MFr Bld: 5.3 % (ref 4.6–6.5)

## 2018-09-14 LAB — PSA: PSA: 0 ng/mL — ABNORMAL LOW (ref 0.10–4.00)

## 2018-09-14 LAB — TSH: TSH: 5.7 u[IU]/mL — AB (ref 0.35–4.50)

## 2018-09-14 MED ORDER — LEVOTHYROXINE SODIUM 137 MCG PO TABS: 137.0000 ug | ORAL_TABLET | Freq: Every day | ORAL | 6 refills | Status: DC

## 2018-09-16 ENCOUNTER — Telehealth: Payer: Self-pay

## 2018-09-16 NOTE — Telephone Encounter (Signed)
Copied from [place] [id number]. Topic: General - Inquiry >> Sep 14, 2018  4:50 PM Vernona Rieger wrote: Reason for CRM: Cecille Rubin with [facility] called and said she can not get into epic but will be going out to the see the patient. Her call back is [phone]

## 2018-09-16 NOTE — Telephone Encounter (Signed)
Called her back, all seems to be taken care of

## 2018-09-19 ENCOUNTER — Telehealth: Payer: Self-pay | Admitting: Family Medicine

## 2018-09-19 NOTE — Telephone Encounter (Signed)
Copied from [place] [id number]. Topic: Quick Communication - Home Health Verbal Orders >> Sep 19, 2018  8:05 AM Scherrie Gerlach wrote: Caller/Agency: Amy // [facility] Callback Number: [phone] Requesting PT Frequency: 2 wk 4  1 wk 2 For strength training

## 2018-09-19 NOTE — Telephone Encounter (Signed)
Verbal orders given via detailed message.  

## 2018-10-03 ENCOUNTER — Telehealth: Payer: Self-pay

## 2018-10-03 NOTE — Telephone Encounter (Signed)
Copied from [place] [id number]. Topic: Quick Communication - Home Health Verbal Orders >> Oct 03, 2018  1:46 PM Gustavus Messing wrote: Caller/Agency: Shelly Bombard Number: [phone] Trish Requesting OT/PT/Skilled Nursing/Social Work/Speech Therapy: Pt want to put Home health orders on hold until [place] is over

## 2018-10-03 NOTE — Telephone Encounter (Signed)
Called and LMOM- ok to delay orders

## 2018-10-05 ENCOUNTER — Telehealth: Payer: Self-pay | Admitting: *Deleted

## 2018-10-05 NOTE — Telephone Encounter (Signed)
Received Home Health Certification and Plan of Care; forwarded to provider/SLS 03/25

## 2018-10-05 NOTE — Telephone Encounter (Signed)
Received Physician Orders from [facility]; forwarded to provider/SLS 03/25

## 2018-10-17 ENCOUNTER — Telehealth: Payer: Self-pay

## 2018-10-17 MED ORDER — RIVAROXABAN 15 MG PO TABS: 15.0000 mg | ORAL_TABLET | Freq: Every day | ORAL | 1 refills | Status: DC

## 2018-10-17 NOTE — Telephone Encounter (Signed)
Age 85, wt 230lbs, Scr 1.74 on 09/14/18, Crcl 60ml/min Last OV: 06/22/18 Indication: afib

## 2018-10-18 ENCOUNTER — Other Ambulatory Visit: Payer: Self-pay

## 2018-10-18 DIAGNOSIS — I6523 Occlusion and stenosis of bilateral carotid arteries: Secondary | ICD-10-CM

## 2018-10-20 ENCOUNTER — Telehealth: Payer: Self-pay | Admitting: *Deleted

## 2018-10-20 NOTE — Telephone Encounter (Signed)
Received Physician Orders/F2F from [facility]; forwarded to provider with requested OV notes/SLS 04/09

## 2018-10-26 ENCOUNTER — Telehealth: Payer: Self-pay | Admitting: Family Medicine

## 2018-10-26 NOTE — Telephone Encounter (Signed)
Noted  

## 2018-10-26 NOTE — Telephone Encounter (Signed)
Copied from [place] [id number]. Topic: Quick Communication - Home Health Verbal Orders >> Oct 26, 2018 11:12 AM Nils Flack wrote: Caller/Agency: Mardelle Matte Number: [phone] Requesting OT/PT/Skilled Nursing/Social Work/Speech Therapy: home helath PT  Frequency:  Pt wants to continue being on hold until further notice due to covid precautions.

## 2018-10-31 ENCOUNTER — Ambulatory Visit: Payer: Medicare Other | Admitting: Family

## 2018-10-31 ENCOUNTER — Encounter (HOSPITAL_COMMUNITY): Payer: Medicare Other

## 2018-11-04 ENCOUNTER — Telehealth: Payer: Self-pay | Admitting: *Deleted

## 2018-11-04 NOTE — Telephone Encounter (Signed)
Received Physician Orders from [facility]; forwarded to provider/SLS 04/24

## 2018-11-14 ENCOUNTER — Telehealth: Payer: Self-pay | Admitting: *Deleted

## 2018-11-14 NOTE — Telephone Encounter (Signed)
Received Physician Orders from [facility]; forwarded to provider/SLS 05/04

## 2018-11-21 ENCOUNTER — Telehealth: Payer: Self-pay | Admitting: *Deleted

## 2018-11-21 NOTE — Telephone Encounter (Signed)
Received Physician Orders from [facility]; forwarded to provider/SLS 05/11

## 2018-12-26 ENCOUNTER — Ambulatory Visit: Payer: Self-pay | Admitting: Family Medicine

## 2018-12-26 ENCOUNTER — Encounter: Payer: Self-pay | Admitting: Family Medicine

## 2018-12-26 ENCOUNTER — Other Ambulatory Visit: Payer: Self-pay

## 2018-12-26 ENCOUNTER — Telehealth: Payer: Self-pay

## 2018-12-26 ENCOUNTER — Ambulatory Visit (INDEPENDENT_AMBULATORY_CARE_PROVIDER_SITE_OTHER): Payer: Medicare Other | Admitting: Family Medicine

## 2018-12-26 VITALS — BP 130/69 | HR 64 | Temp 97.7°F | Ht 67.0 in

## 2018-12-26 DIAGNOSIS — Z20828 Contact with and (suspected) exposure to other viral communicable diseases: Secondary | ICD-10-CM

## 2018-12-26 DIAGNOSIS — R6883 Chills (without fever): Secondary | ICD-10-CM | POA: Diagnosis not present

## 2018-12-26 DIAGNOSIS — R110 Nausea: Secondary | ICD-10-CM

## 2018-12-26 DIAGNOSIS — R0600 Dyspnea, unspecified: Secondary | ICD-10-CM

## 2018-12-26 MED ORDER — ONDANSETRON 4 MG PO TBDP: 4.0000 mg | ORAL_TABLET | Freq: Three times a day (TID) | ORAL | 0 refills | Status: DC | PRN

## 2018-12-26 NOTE — Telephone Encounter (Signed)
Received request from Dr. Nani Ravens for COVID testing / Patient states he went to get the testing done already.   / No need to schedule and order.

## 2018-12-26 NOTE — Telephone Encounter (Signed)
Pt. Reports he woke up at 0400 this morning with chills, weakness, can't get a good deep breath. Temp. 97.6 per wife. Warm transfer to [facility] in the practice about a virtual visit.  Answer Assessment - Initial Assessment Questions 1. DESCRIPTION: "Describe how you are feeling."     Weak, chills 2. SEVERITY: "How bad is it?"  "Can you stand and walk?"   - MILD - Feels weak or tired, but does not interfere with work, school or normal activities   - [place] to stand and walk; weakness interferes with work, school, or normal activities   - SEVERE - Unable to stand or walk     Moderate 3. ONSET:  "When did the weakness begin?"     This morning 4. CAUSE: "What do you think is causing the weakness?"     Unsure 5. MEDICINES: "Have you recently started a new medicine or had a change in the amount of a medicine?"     No 6. OTHER SYMPTOMS: "Do you have any other symptoms?" (e.g., chest pain, fever, cough, SOB, vomiting, diarrhea, bleeding, other areas of pain)     Dizziness 7. PREGNANCY: "Is there any chance you are pregnant?" "When was your last menstrual period?"     n/a  Protocols used: WEAKNESS (GENERALIZED) AND FATIGUE-A-AH

## 2018-12-26 NOTE — Progress Notes (Signed)
Chief Complaint  Patient presents with  . Shortness of Breath    Pt states having chills and can't take a deep breathe and has cough. No temp. Pt states having episodes of diarrhea. Pt states not being around anyone COVID positive that he is aware of. Pt states first visit out of the house was last week since Feb.  . [place] here for URI complaints. Due to COVID-19 pandemic, we are interacting via web portal for an electronic face-to-face visit. I verified patient's ID using 2 identifiers. Patient agreed to proceed with visit via this method. Patient is at home, I am at home. Patient, his wife, and I are present for visit.    Duration: 1 day  Associated symptoms: rhinorrhea, shortness of breath, nausea and chills Denies: sinus congestion, sinus pain, ear pain, ear drainage, sore throat, myalgia and fevers Treatment to date: None No significant travel. Went to [employer] last Th, had a bout of diarrhea. Bowels are back to normal. This was the first time he left his house over past 3 mo.  Sick contacts: No  ROS:  Const: +chills HEENT: As noted in HPI Lungs: + SOB  Past Medical History:  Diagnosis Date  . Anginal pain ([place])   . Anxiety    " OCCASIONAL"  . Arthritis   . Asthma due to seasonal allergies    uses inhalers prn  . Carotid artery occlusion    Left  . Complication of anesthesia    " had tremors after prostate surgery  . Coronary artery disease   . Diverticulitis    recurrent  . Family history of anesthesia complication    MOTHER   . GERD (gastroesophageal reflux disease)   . H/O hiatal hernia   . Hearing aid worn   . Hyperlipemia   . Hypertension   . Kidney stone    lithotripsy 4008 w complications, req stents, Dr Rosana Hoes  . Prostate CA ([place])   . Shortness of breath   . Thyroid disease   . TIA (transient ischemic attack)   . Tinnitus     BP 130/69   Pulse 64   Temp 97.7 F (36.5 C) (Oral)   Ht 5\' 7"  (1.702 m)   BMI 36.02 kg/m  No  conversational dyspnea Age appropriate judgment and insight Nml affect and mood HOH  Nausea - Plan: ondansetron (ZOFRAN-ODT) 4 MG disintegrating tablet  Chills   Dyspnea, unspecified type   Orders as above. Gave number to contact health dept to discuss COVID-19 screening. Continue to push fluids, practice good hand hygiene, cover mouth when coughing. F/u prn. If starting to experience fevers, shaking, or shortness of breath, seek immediate care. Pt and his wife voiced understanding and agreement to the plan.  [place], DO 12/26/18 11:51 AM

## 2018-12-26 NOTE — Telephone Encounter (Signed)
Virtual visit scheduled.  

## 2018-12-29 ENCOUNTER — Telehealth: Payer: Self-pay | Admitting: Family Medicine

## 2018-12-29 NOTE — Telephone Encounter (Signed)
Please clarify Atrovent instructions. The patient has been following instructions on botttle  (2 sprays into the nose 4 times daily). His question is should he do 2 sprays each side/or 1 spray? He has been doing 2 sprays each side 4 times a day since prescribed.

## 2018-12-30 NOTE — Telephone Encounter (Signed)
Called and LMOM- 4 sprays each side QID is max dose,  He can use less if satisafactort

## 2019-01-25 ENCOUNTER — Telehealth: Payer: Self-pay | Admitting: Family Medicine

## 2019-01-25 NOTE — Telephone Encounter (Signed)
Spoke with [name] regarding AWV. Pt declined to schedule. Stated he only wants to see Dr. Lorelei Pont. SF

## 2019-02-03 IMAGING — CR DG CHEST 2V
2 series · 2 of 2 positions shown · non-contrast
Comparison: 03/24/2015 chest radiographs

CLINICAL DATA: 83 y/o  M; chest pain.

EXAM:
CHEST  2 VIEW

[chest pa]
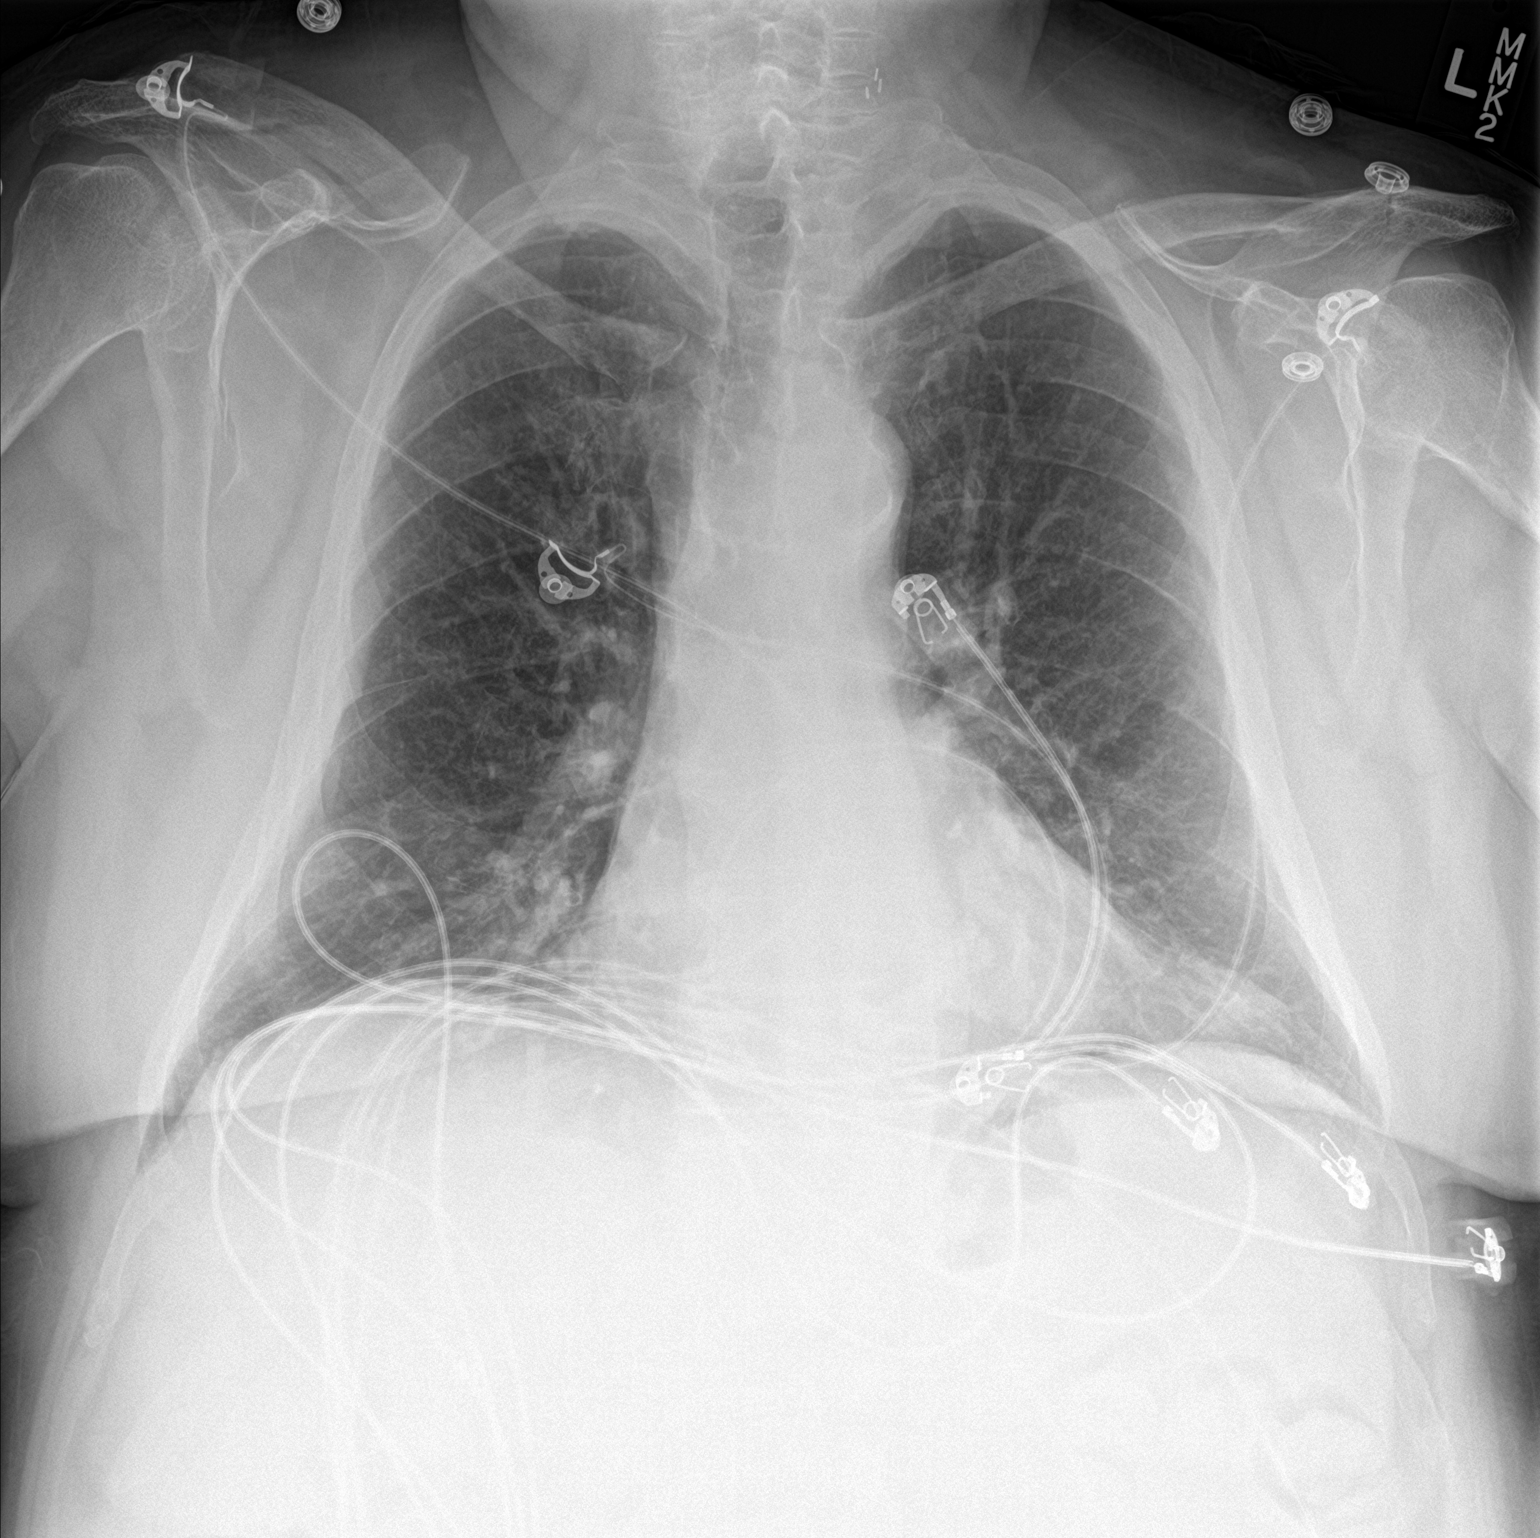

[chest lat]
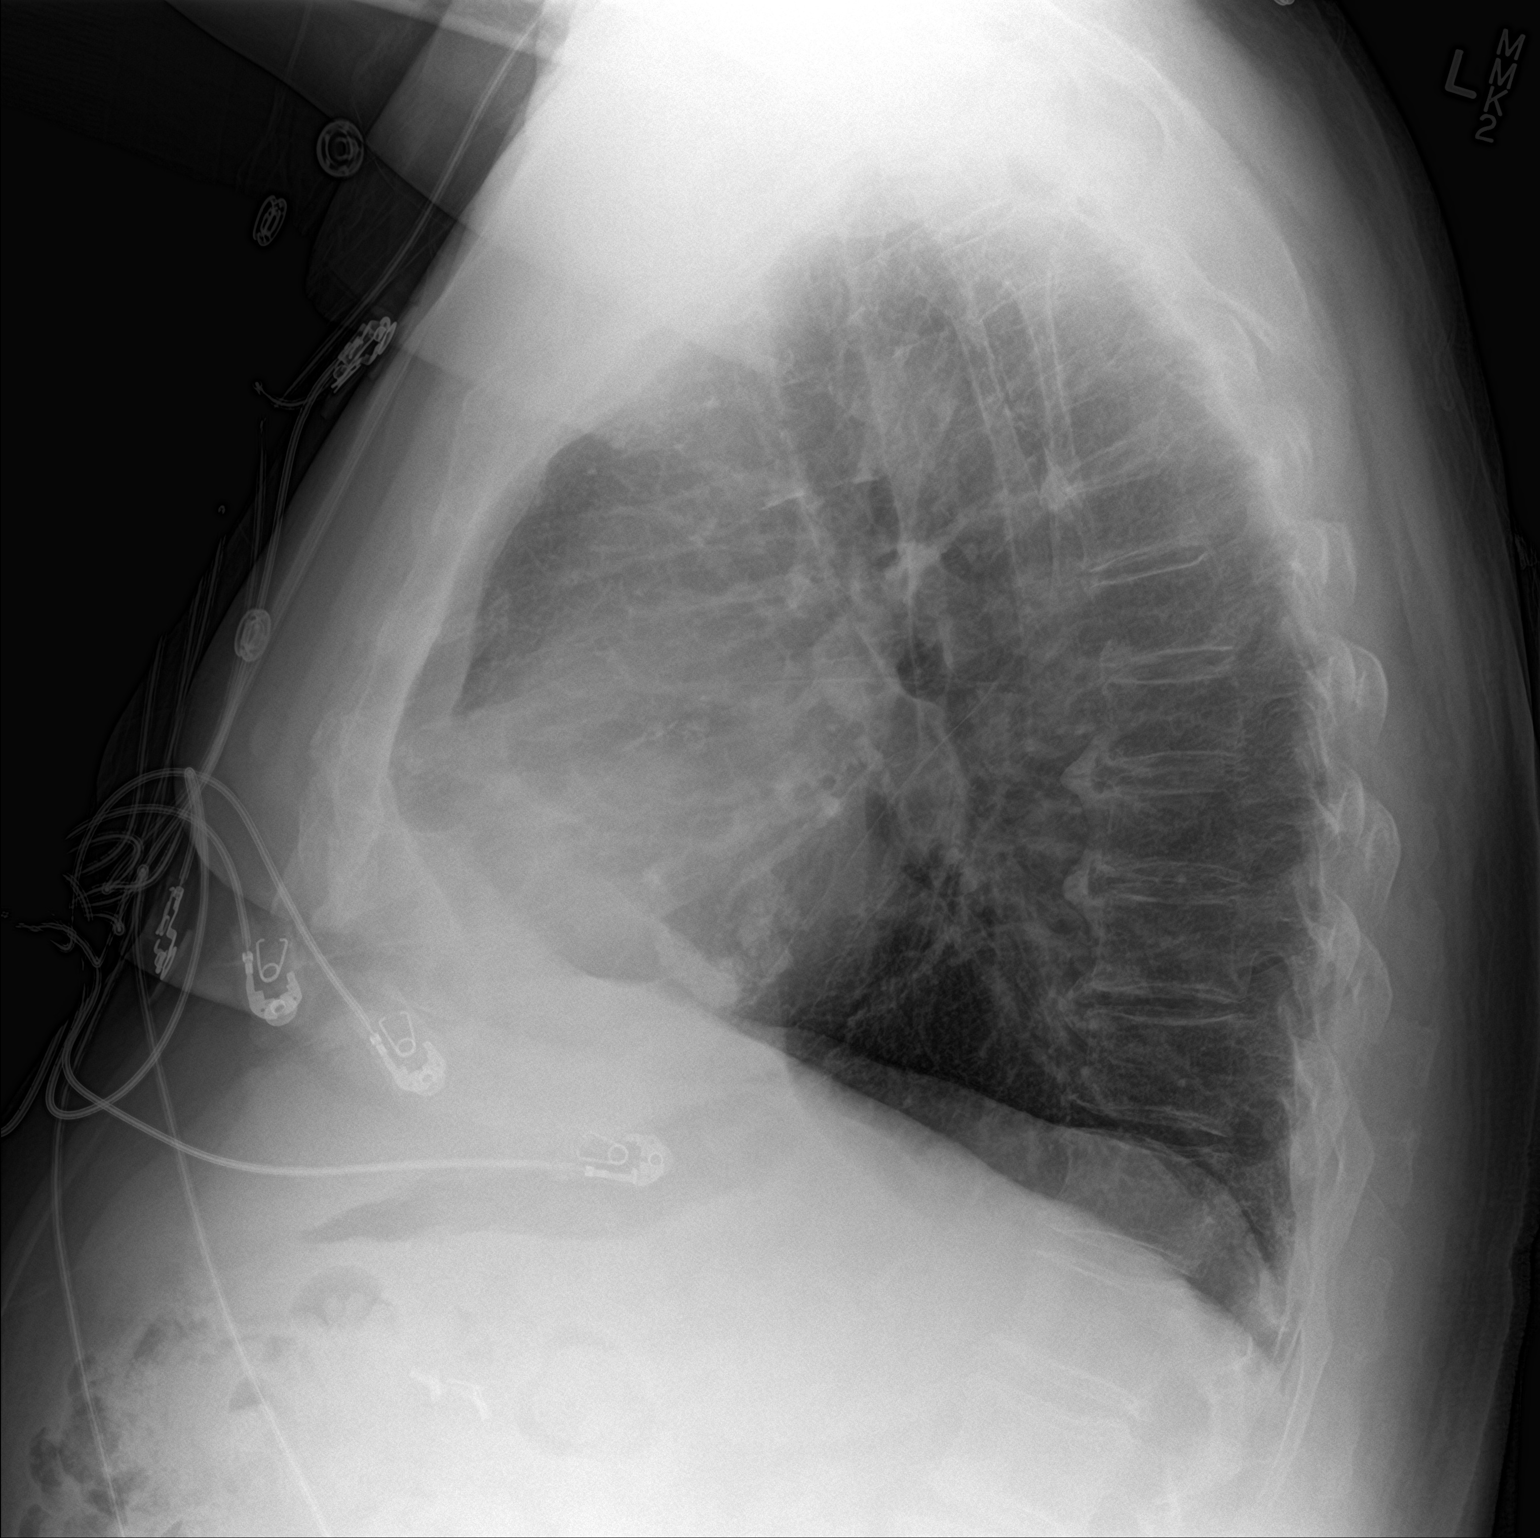

[2 of 2 positions shown; findings below may reference images not displayed]

FINDINGS: Normal cardiac silhouette. Aortic atherosclerosis with
calcification. Stable coarse lung markings in lung bases, likely
chronic bronchitic changes No pleural effusion or pneumothorax.
Moderate multilevel degenerative changes of the spine.
IMPRESSION: Stable chronic bronchitic changes in lung bases. No focal
consolidation. Aortic atherosclerosis.

By: Jarvis Jim M.D.

## 2019-02-07 ENCOUNTER — Inpatient Hospital Stay (HOSPITAL_COMMUNITY)
Admission: EM | Admit: 2019-02-07 | Discharge: 2019-02-09 | DRG: 392 | Disposition: A | Discharge status: Home / Self Care | Payer: Medicare Other | Attending: Internal Medicine | Admitting: Internal Medicine

## 2019-02-07 ENCOUNTER — Emergency Department (HOSPITAL_COMMUNITY): Payer: Medicare Other

## 2019-02-07 ENCOUNTER — Encounter (HOSPITAL_COMMUNITY): Payer: Self-pay | Admitting: Emergency Medicine

## 2019-02-07 DIAGNOSIS — Z91018 Allergy to other foods: Secondary | ICD-10-CM

## 2019-02-07 DIAGNOSIS — Z841 Family history of disorders of kidney and ureter: Secondary | ICD-10-CM

## 2019-02-07 DIAGNOSIS — Z881 Allergy status to other antibiotic agents status: Secondary | ICD-10-CM

## 2019-02-07 DIAGNOSIS — K5732 Diverticulitis of large intestine without perforation or abscess without bleeding: Secondary | ICD-10-CM | POA: Diagnosis not present

## 2019-02-07 DIAGNOSIS — Z79899 Other long term (current) drug therapy: Secondary | ICD-10-CM

## 2019-02-07 DIAGNOSIS — K5792 Diverticulitis of intestine, part unspecified, without perforation or abscess without bleeding: Secondary | ICD-10-CM | POA: Diagnosis present

## 2019-02-07 DIAGNOSIS — Z87442 Personal history of urinary calculi: Secondary | ICD-10-CM | POA: Diagnosis not present

## 2019-02-07 DIAGNOSIS — E039 Hypothyroidism, unspecified: Secondary | ICD-10-CM | POA: Diagnosis present

## 2019-02-07 DIAGNOSIS — Z8673 Personal history of transient ischemic attack (TIA), and cerebral infarction without residual deficits: Secondary | ICD-10-CM | POA: Diagnosis not present

## 2019-02-07 DIAGNOSIS — Z8249 Family history of ischemic heart disease and other diseases of the circulatory system: Secondary | ICD-10-CM | POA: Diagnosis not present

## 2019-02-07 DIAGNOSIS — Z79890 Hormone replacement therapy (postmenopausal): Secondary | ICD-10-CM

## 2019-02-07 DIAGNOSIS — Z888 Allergy status to other drugs, medicaments and biological substances status: Secondary | ICD-10-CM | POA: Diagnosis not present

## 2019-02-07 DIAGNOSIS — F411 Generalized anxiety disorder: Secondary | ICD-10-CM | POA: Diagnosis present

## 2019-02-07 DIAGNOSIS — E785 Hyperlipidemia, unspecified: Secondary | ICD-10-CM | POA: Diagnosis present

## 2019-02-07 DIAGNOSIS — Z6835 Body mass index (BMI) 35.0-35.9, adult: Secondary | ICD-10-CM

## 2019-02-07 DIAGNOSIS — Z885 Allergy status to narcotic agent status: Secondary | ICD-10-CM | POA: Diagnosis not present

## 2019-02-07 DIAGNOSIS — D075 Carcinoma in situ of prostate: Secondary | ICD-10-CM | POA: Diagnosis present

## 2019-02-07 DIAGNOSIS — I2510 Atherosclerotic heart disease of native coronary artery without angina pectoris: Secondary | ICD-10-CM | POA: Diagnosis present

## 2019-02-07 DIAGNOSIS — Z6834 Body mass index (BMI) 34.0-34.9, adult: Secondary | ICD-10-CM | POA: Diagnosis present

## 2019-02-07 DIAGNOSIS — I129 Hypertensive chronic kidney disease with stage 1 through stage 4 chronic kidney disease, or unspecified chronic kidney disease: Secondary | ICD-10-CM | POA: Diagnosis present

## 2019-02-07 DIAGNOSIS — Z20828 Contact with and (suspected) exposure to other viral communicable diseases: Secondary | ICD-10-CM | POA: Diagnosis present

## 2019-02-07 DIAGNOSIS — I10 Essential (primary) hypertension: Secondary | ICD-10-CM | POA: Diagnosis present

## 2019-02-07 DIAGNOSIS — I480 Paroxysmal atrial fibrillation: Secondary | ICD-10-CM | POA: Diagnosis present

## 2019-02-07 DIAGNOSIS — G4733 Obstructive sleep apnea (adult) (pediatric): Secondary | ICD-10-CM | POA: Diagnosis present

## 2019-02-07 DIAGNOSIS — E6609 Other obesity due to excess calories: Secondary | ICD-10-CM | POA: Diagnosis present

## 2019-02-07 DIAGNOSIS — N1832 Chronic kidney disease, stage 3b: Secondary | ICD-10-CM | POA: Diagnosis present

## 2019-02-07 DIAGNOSIS — K219 Gastro-esophageal reflux disease without esophagitis: Secondary | ICD-10-CM | POA: Diagnosis present

## 2019-02-07 DIAGNOSIS — N183 Chronic kidney disease, stage 3 (moderate): Secondary | ICD-10-CM | POA: Diagnosis present

## 2019-02-07 DIAGNOSIS — E669 Obesity, unspecified: Secondary | ICD-10-CM | POA: Diagnosis present

## 2019-02-07 DIAGNOSIS — J45909 Unspecified asthma, uncomplicated: Secondary | ICD-10-CM | POA: Diagnosis present

## 2019-02-07 LAB — CBC
HCT: 43.8 % (ref 39.0–52.0)
Hemoglobin: 15.3 g/dL (ref 13.0–17.0)
MCH: 35.9 pg — ABNORMAL HIGH (ref 26.0–34.0)
MCHC: 34.9 g/dL (ref 30.0–36.0)
MCV: 102.8 fL — ABNORMAL HIGH (ref 80.0–100.0)
Platelets: 136 10*3/uL — ABNORMAL LOW (ref 150–400)
RBC: 4.26 MIL/uL (ref 4.22–5.81)
RDW: 13.7 % (ref 11.5–15.5)
WBC: 9.9 10*3/uL (ref 4.0–10.5)
nRBC: 0 % (ref 0.0–0.2)

## 2019-02-07 LAB — COMPREHENSIVE METABOLIC PANEL
ALT: 41 U/L (ref 0–44)
AST: 32 U/L (ref 15–41)
Albumin: 4.1 g/dL (ref 3.5–5.0)
Alkaline Phosphatase: 52 U/L (ref 38–126)
Anion gap: 11 (ref 5–15)
BUN: 22 mg/dL (ref 8–23)
CO2: 23 mmol/L (ref 22–32)
Calcium: 9.5 mg/dL (ref 8.9–10.3)
Chloride: 102 mmol/L (ref 98–111)
Creatinine, Ser: 1.87 mg/dL — ABNORMAL HIGH (ref 0.61–1.24)
GFR calc Af Amer: 38 mL/min — ABNORMAL LOW (ref >60)
GFR calc non Af Amer: 33 mL/min — ABNORMAL LOW (ref >60)
Glucose, Bld: 118 mg/dL — ABNORMAL HIGH (ref 70–99)
Potassium: 4.3 mmol/L (ref 3.5–5.1)
Sodium: 136 mmol/L (ref 135–145)
Total Bilirubin: 3.4 mg/dL — ABNORMAL HIGH (ref 0.3–1.2)
Total Protein: 7.6 g/dL (ref 6.5–8.1)

## 2019-02-07 LAB — URINALYSIS, ROUTINE W REFLEX MICROSCOPIC
Bacteria, UA: NONE SEEN
Bilirubin Urine: NEGATIVE
Glucose, UA: NEGATIVE mg/dL
Ketones, ur: NEGATIVE mg/dL
Leukocytes,Ua: NEGATIVE
Nitrite: NEGATIVE
Protein, ur: NEGATIVE mg/dL
Specific Gravity, Urine: 1.016 (ref 1.005–1.030)
pH: 5 (ref 5.0–8.0)

## 2019-02-07 LAB — LIPASE, BLOOD: Lipase: 26 U/L (ref 11–51)

## 2019-02-07 LAB — SARS CORONAVIRUS 2 BY RT PCR (HOSPITAL ORDER, PERFORMED IN ~~LOC~~ HOSPITAL LAB): SARS Coronavirus 2: NEGATIVE

## 2019-02-07 MED ORDER — PIPERACILLIN-TAZOBACTAM 3.375 G IVPB
3.3750 g | Freq: Three times a day (TID) | INTRAVENOUS | Status: DC
  Administered 2019-02-08 – 2019-02-09 (×4): 3.375 g via INTRAVENOUS
  Filled 2019-02-07 (×6): qty 50

## 2019-02-07 MED ORDER — MORPHINE SULFATE (PF) 4 MG/ML IV SOLN
4.0000 mg | Freq: Once | INTRAVENOUS | Status: AC
  Administered 2019-02-07: 4 mg via INTRAVENOUS
  Filled 2019-02-07: qty 1

## 2019-02-07 MED ORDER — SODIUM CHLORIDE 0.9% FLUSH
3.0000 mL | Freq: Once | INTRAVENOUS | Status: AC
  Administered 2019-02-08: 3 mL via INTRAVENOUS

## 2019-02-07 MED ORDER — ONDANSETRON HCL 4 MG/2ML IJ SOLN
4.0000 mg | Freq: Once | INTRAMUSCULAR | Status: AC
  Administered 2019-02-07: 4 mg via INTRAVENOUS
  Filled 2019-02-07: qty 2

## 2019-02-07 MED ORDER — SODIUM CHLORIDE 0.9 % IV BOLUS
1000.0000 mL | Freq: Once | INTRAVENOUS | Status: AC
  Administered 2019-02-07: 1000 mL via INTRAVENOUS

## 2019-02-07 MED ORDER — PIPERACILLIN-TAZOBACTAM 3.375 G IVPB 30 MIN
3.3750 g | Freq: Once | INTRAVENOUS | Status: AC
  Administered 2019-02-07: 3.375 g via INTRAVENOUS
  Filled 2019-02-07: qty 50

## 2019-02-07 NOTE — H&P (Signed)
History and Physical   [name] PYK:[id number] DOB: 11-Jun-1934 DOA: 02/07/2019  Referring MD/NP/PA: Dr. Lajean Saver  PCP: Zachary Pont Gay Filler, MD   Outpatient Specialists: None  Patient coming from: Home  Chief Complaint: Abdominal pain  HPI: [name] is a 85 y.o. male with medical history significant of diverticulitis, coronary artery disease, asthma, carotid artery occlusion, GERD, hypertension, hyperlipidemia hypothyroidism, and prostate cancer who has had previous work-up for diverticulitis and has been trying to do well but started having another abdominal pain yesterday.  Pain is rated as 9 out of 10 in the left lower quadrant.  Associated with some nausea but no vomiting.  Denied any fever or chills denied any diarrhea denied any melena and no bright red blood per rectum.  Patient was seen and evaluated and found to have acute diverticulitis.  He has been unable to manage this at home so he will be admitted to the hospital for further work-up..  ED Course: Temperature 98 blood pressure 150/78 pulse 90 respirate of 18 oxygen sat 94% room air.  White count is 9.8 hemoglobin 15.3 and platelets 136.  Sodium 136 potassium 4.3 chloride 102 BUN 22 creatinine 1.87 and calcium 9.5.  Glucose 118.  Urinalysis negative.  CT abdomen pelvis shows mild bowel wall thickening with surrounding inflammation of the sigmoid colon and descending colon junction.  This is consistent with acute diverticulitis.  Review of Systems: As per HPI otherwise 10 point review of systems negative.    Past Medical History:  Diagnosis Date  . Anginal pain ([place])   . Anxiety    " OCCASIONAL"  . Arthritis   . Asthma due to seasonal allergies    uses inhalers prn  . Carotid artery occlusion    Left  . Complication of anesthesia    " had tremors after prostate surgery  . Coronary artery disease   . Diverticulitis    recurrent  . Family history of anesthesia complication    MOTHER   . GERD (gastroesophageal  reflux disease)   . H/O hiatal hernia   . Hearing aid worn   . Hyperlipemia   . Hypertension   . Kidney stone    lithotripsy 5397 w complications, req stents, Dr Rosana Hoes  . Prostate CA ([place])   . Shortness of breath   . Thyroid disease   . TIA (transient ischemic attack)   . Tinnitus     Past Surgical History:  Procedure Laterality Date  . CARDIAC CATHETERIZATION  2008   minimal dz, Dr Cathie Olden  . CARDIAC CATHETERIZATION N/A 10/09/2015   Procedure: Left Heart Cath and Coronary Angiography;  Surgeon: Peter M Martinique, MD;  Location: [facility];  Service: Cardiovascular;  Laterality: N/A;  . CARDIAC CATHETERIZATION N/A 10/09/2015   Procedure: Intravascular Pressure Wire/FFR Study;  Surgeon: Peter M Martinique, MD;  Location: [facility];  Service: Cardiovascular;  Laterality: N/A;  . CARDIAC CATHETERIZATION N/A 10/09/2015   Procedure: Coronary Stent Intervention;  Surgeon: Peter M Martinique, MD;  Location: [facility];  Service: Cardiovascular;  Laterality: N/A;  . CAROTID ENDARTERECTOMY  Left   1998  . CHOLECYSTECTOMY    . CORONARY STENT PLACEMENT  10/09/2015   DES to RCA  . ESOPHAGOGASTRODUODENOSCOPY (EGD) WITH PROPOFOL Left 03/26/2017   Procedure: ESOPHAGOGASTRODUODENOSCOPY (EGD) WITH PROPOFOL;  Surgeon: Arta Silence, MD;  Location: [facility];  Service: Endoscopy;  Laterality: Left;  . LEFT HEART CATHETERIZATION WITH CORONARY ANGIOGRAM N/A 09/15/2013   Procedure: LEFT HEART  CATHETERIZATION WITH CORONARY ANGIOGRAM;  Surgeon: Peter M Martinique, MD;  Location: [facility];  Service: Cardiovascular;  Laterality: N/A;  . PROSTATECTOMY  1998   radical for prostate cancer     reports that he quit smoking about 33 years ago. His smoking use included cigarettes. He has a 75.00 pack-year smoking history. He has never used smokeless tobacco. He reports current alcohol use of about 2.0 standard drinks of alcohol per week. He reports that he does not use drugs.  Allergies  Allergen  Reactions  . Ciprofloxacin Other (See Comments)    Hallucinations or jittery Hallucinations or jittery  . Codeine Other (See Comments)    hallucinations hallucinations  . Flexeril [Cyclobenzaprine] Other (See Comments)    "Made me feel goofy"  . Imdur [Isosorbide Nitrate] Other (See Comments)    headache  . Methocarbamol Other (See Comments)    "Made me feel goofy"  . Sulfa Antibiotics Other (See Comments)    Chills and shaking- "serum sickness"   . Sulfamethoxazole Other (See Comments)  . Sulfonamide Derivatives Other (See Comments)    Chills and shaking "serum sickness"  . Flagyl [Metronidazole] Rash    Family History  Problem Relation Age of Onset  . Heart disease Mother   . Heart disease Father   . Kidney failure Father   . Heart disease Sister   . Heart attack Sister   . Dementia Sister   . Sjogren's syndrome Child   . Hashimoto's thyroiditis Child   . CAD Child      Prior to Admission medications   Medication Sig Start Date End Date Taking? Authorizing Provider  albuterol (VENTOLIN HFA) 108 (90 Base) MCG/ACT inhaler Inhale 2 puffs into the lungs every 6 (six) hours as needed for wheezing or shortness of breath. 08/18/17  Yes Copland, Gay Filler, MD  levothyroxine (SYNTHROID, LEVOTHROID) 137 MCG tablet Take 1 tablet (137 mcg total) by mouth daily. 09/14/18  Yes Copland, Gay Filler, MD  losartan (COZAAR) 25 MG tablet Take 25 mg by mouth 2 (two) times a day.  12/30/18  Yes [provider]  nitroGLYCERIN (NITROLINGUAL) 0.4 MG/SPRAY spray Place 1 spray under the tongue every 5 (five) minutes x 3 doses as needed for chest pain. 07/18/18  Yes Martinique, Peter M, MD  ondansetron (ZOFRAN-ODT) 4 MG disintegrating tablet Take 1 tablet (4 mg total) by mouth every 8 (eight) hours as needed for nausea or vomiting. 12/26/18  Yes Wendling, Crosby Oyster, DO  polyethylene glycol (MIRALAX / GLYCOLAX) packet Take 17 g by mouth daily as needed for mild constipation. Mix in 8 oz liquid and  drink    Yes [provider]  triamcinolone cream (KENALOG) 0.1 % Apply 1 application topically daily as needed (for rashes).    Yes [provider]  Artificial Tear Solution (BION TEARS OP) Place 1 drop into both eyes daily as needed (dry eyes).    [provider]  beclomethasone (QVAR REDIHALER) 80 MCG/ACT inhaler Inhale 1 puff into the lungs 2 (two) times daily. Patient not taking: Reported on 02/07/2019 06/02/18   Copland, Gay Filler, MD  carvedilol (COREG) 6.25 MG tablet TAKE 1 TABLET BY MOUTH TWICE A DAY WITH A MEAL Patient taking differently: Take 6.25 mg by mouth 2 (two) times daily with a meal.  01/31/18   Copland, Gay Filler, MD  clobetasol (TEMOVATE) 0.05 % external solution APPLY TO SCALP DAILY AS NEEDED 10/06/17   [provider]  ezetimibe (ZETIA) 10 MG tablet TAKE 1 TABLET  BY MOUTH EVERY DAY 06/30/18   Martinique, Peter M, MD  furosemide (LASIX) 20 MG tablet Take 1 tablet (20 mg total) by mouth daily. 05/11/18   Barrett, Evelene Croon, PA-C  HYDROcodone-acetaminophen (NORCO/VICODIN) 5-325 MG tablet Take 0.5-1 tablets by mouth 2 (two) times daily as needed (pain). Patient taking differently: Take 0.5-1 tablets by mouth once a week.  11/23/16   Copland, Gay Filler, MD  hydrocortisone 2.5 % cream Apply 1 application topically 2 (two) times daily.    [provider]  ipratropium (ATROVENT) 0.03 % nasal spray PLACE 2 SPRAYS INTO THE NOSE 4 (FOUR) TIMES DAILY. 05/06/18   Copland, Gay Filler, MD  losartan (COZAAR) 50 MG tablet Take 1 tablet (50 mg total) by mouth daily. Patient not taking: Reported on 02/07/2019 05/11/18 02/07/19  Barrett, Evelene Croon, PA-C  Rivaroxaban (XARELTO) 15 MG TABS tablet Take 1 tablet (15 mg total) by mouth daily with supper. Patient not taking: Reported on 02/07/2019 10/17/18   Martinique, Peter M, MD    Physical Exam: Vitals:   02/07/19 1346 02/07/19 1900 02/07/19 1930  BP: (!) 155/66 138/65 (!) 156/77  Pulse: 90 88 90  Resp: 16  16   Temp: 98.3 F (36.8 C)    TempSrc: Oral    SpO2: 95% 96% 94%      Constitutional: NAD, calm, comfortable Vitals:   02/07/19 1346 02/07/19 1900 02/07/19 1930  BP: (!) 155/66 138/65 (!) 156/77  Pulse: 90 88 90  Resp: 16  16  Temp: 98.3 F (36.8 C)    TempSrc: Oral    SpO2: 95% 96% 94%   Eyes: PERRL, lids and conjunctivae normal ENMT: Mucous membranes are moist. Posterior pharynx clear of any exudate or lesions.Normal dentition.  Neck: normal, supple, no masses, no thyromegaly Respiratory: clear to auscultation bilaterally, no wheezing, no crackles. Normal respiratory effort. No accessory muscle use.  Cardiovascular: Regular rate and rhythm, no murmurs / rubs / gallops. No extremity edema. 2+ pedal pulses. No carotid bruits.  Abdomen: Marked tenderness in the left mid and lower quadrant, no masses palpated. No hepatosplenomegaly. Bowel sounds positive.  Musculoskeletal: no clubbing / cyanosis. No joint deformity upper and lower extremities. Good ROM, no contractures. Normal muscle tone.  Skin: no rashes, lesions, ulcers. No induration Neurologic: CN 2-12 grossly intact. Sensation intact, DTR normal. Strength 5/5 in all 4.  Psychiatric: Normal judgment and insight. Alert and oriented x 3. Normal mood.     Labs on Admission: I have personally reviewed following labs and imaging studies  CBC: Recent Labs  Lab 02/07/19 1420  WBC 9.9  HGB 15.3  HCT 43.8  MCV 102.8*  PLT 440*   Basic Metabolic Panel: Recent Labs  Lab 02/07/19 1420  NA 136  K 4.3  CL 102  CO2 23  GLUCOSE 118*  BUN 22  CREATININE 1.87*  CALCIUM 9.5   GFR: CrCl cannot be calculated (Unknown ideal weight.). Liver Function Tests: Recent Labs  Lab 02/07/19 1420  AST 32  ALT 41  ALKPHOS 52  BILITOT 3.4*  PROT 7.6  ALBUMIN 4.1   Recent Labs  Lab 02/07/19 1420  LIPASE 26   No results for input(s): AMMONIA in the last 168 hours. Coagulation Profile: No results for input(s): INR, PROTIME in  the last 168 hours. Cardiac Enzymes: No results for input(s): CKTOTAL, CKMB, CKMBINDEX, TROPONINI in the last 168 hours. BNP (last 3 results) No results for input(s): PROBNP in the last 8760 hours. HbA1C: No results for input(s): HGBA1C in  the last 72 hours. CBG: No results for input(s): GLUCAP in the last 168 hours. Lipid Profile: No results for input(s): CHOL, HDL, LDLCALC, TRIG, CHOLHDL, LDLDIRECT in the last 72 hours. Thyroid Function Tests: No results for input(s): TSH, T4TOTAL, FREET4, T3FREE, THYROIDAB in the last 72 hours. Anemia Panel: No results for input(s): VITAMINB12, FOLATE, FERRITIN, TIBC, IRON, RETICCTPCT in the last 72 hours. Urine analysis:    Component Value Date/Time   COLORURINE YELLOW 03/24/2015 0525   APPEARANCEUR CLEAR 03/24/2015 0525   LABSPEC 1.013 03/24/2015 0525   PHURINE 5.0 03/24/2015 0525   GLUCOSEU NEGATIVE 03/24/2015 0525   HGBUR TRACE (A) 03/24/2015 0525   BILIRUBINUR NEGATIVE 03/24/2015 0525   BILIRUBINUR negative 01/30/2013 0948   KETONESUR NEGATIVE 03/24/2015 0525   PROTEINUR NEGATIVE 03/24/2015 0525   UROBILINOGEN 0.2 03/24/2015 0525   NITRITE NEGATIVE 03/24/2015 0525   LEUKOCYTESUR NEGATIVE 03/24/2015 0525   Sepsis Labs: @LABRCNTIP (procalcitonin:4,lacticidven:4) )No results found for this or any previous visit (from the past 240 hour(s)).   Radiological Exams on Admission: Ct Abdomen Pelvis Wo Contrast  Result Date: 02/07/2019 CLINICAL DATA:  Left lower quadrant abdomen pain. EXAM: CT ABDOMEN AND PELVIS WITHOUT CONTRAST TECHNIQUE: Multidetector CT imaging of the abdomen and pelvis was performed following the standard protocol without IV contrast. COMPARISON:  September 29, 2017 FINDINGS: Lower chest: No acute abnormality. Hepatobiliary: Diffuse low density of the liver is identified without focal liver lesion noted. Previously noted small low-density in the left lobe liver is not definitely seen on this noncontrast exam. Patient status post  prior cholecystectomy. The biliary tree is normal. Pancreas: Unremarkable. No pancreatic ductal dilatation or surrounding inflammatory changes. Spleen: Normal in size without focal abnormality. Adrenals/Urinary Tract: The bilateral adrenal glands are normal. The bilateral kidneys are atrophic, right worse than left. There are nonobstructing stones in the right kidney. There is no hydronephrosis bilaterally. The bladder is normal. Stomach/Bowel: There is marked bowel wall thickening with surrounding inflammation at the sigmoid colon descending colon junction. There is diverticulosis of colon. There is no small bowel obstruction. The appendix is normal. The stomach is normal. Vascular/Lymphatic: Aortic atherosclerosis. No enlarged abdominal or pelvic lymph nodes. Reproductive: Patient status post prior prostatectomy. Other: None. Musculoskeletal: Degenerative joint changes of spine are identified. IMPRESSION: Marked bowel wall thickening with surrounding inflammation at the sigmoid colon/descending colon junction. The findings can be seen in acute diverticulitis. Follow-up CT scan after treatment is recommended to ensure no underlying mass is present in the sigmoid colon/descending colon junction. Electronically Signed   By: Abelardo Diesel M.D.   On: 02/07/2019 19:31      Assessment/Plan Principal Problem:   Acute diverticulitis Active Problems:   GASTROESOPHAGEAL REFLUX, NO ESOPHAGITIS   Obesity   Generalized anxiety disorder   Carcinoma in situ of prostate   OSA (obstructive sleep apnea)   Hypothyroidism, acquired   Essential hypertension   Hyperlipemia   CKD (chronic kidney disease), stage III (HCC)   AF (paroxysmal atrial fibrillation) ([place])     #1 acute diverticulitis: Patient will be admitted and initiated on supportive treatment.  Allergic to quinolones.  Initiate IV Zosyn.  Pain control and nausea control.  #2 GERD: Continue PPIs.  #3 essential hypertension: Continue blood pressure  control.  #4 hypothyroidism: Continue levothyroxine.  #5 hyperlipidemia: Continue home regimen.  #6 morbid obesity: Dietary counseling offered.  Continue close monitoring.  #7 chronic kidney disease stage III: Continue close monitoring.  Renal function appears to be at baseline.  #8 paroxysmal atrial fibrillation: Patient  currently in sinus rhythm.   DVT prophylaxis: Lovenox Code Status: Full code Family Communication: Wife in the room Disposition Plan: Home Consults called: None Admission status: Inpatient  Severity of Illness: The appropriate patient status for this patient is INPATIENT. Inpatient status is judged to be reasonable and necessary in order to provide the required intensity of service to ensure the patient's safety. The patient's presenting symptoms, physical exam findings, and initial radiographic and laboratory data in the context of their chronic comorbidities is felt to place them at high risk for further clinical deterioration. Furthermore, it is not anticipated that the patient will be medically stable for discharge from the hospital within 2 midnights of admission. The following factors support the patient status of inpatient.   " The patient's presenting symptoms include abdominal pain. " The worrisome physical exam findings include tenderness in the left lower quadrant. " The initial radiographic and laboratory data are worrisome because of CT abdomen confirmed diverticulitis. " The chronic co-morbidities include history of diverticulitis.   * I certify that at the point of admission it is my clinical judgment that the patient will require inpatient hospital care spanning beyond 2 midnights from the point of admission due to high intensity of service, high risk for further deterioration and high frequency of surveillance required.Barbette Merino MD [facility] Pager [phone]  If 7PM-7AM, please contact night-coverage [email or web address] Password  [email or web address]  02/07/2019, 8:03 PM

## 2019-02-07 NOTE — ED Provider Notes (Signed)
Care assumed from  Dr. Lajean Saver, MD at shift change with CT abd/pelvis pending.   In brief, this patient is a 85 y.o. M who presents for evaluation of left sided abdominal pain. He does have a history of diverticulitis. Please see note from previous provider for full history/physical exam.    Physical Exam  BP (!) 156/77   Pulse 90   Temp 98.3 F (36.8 C) (Oral)   Resp 16   Ht 5\' 7"  (1.702 m)   Wt 101.6 kg   SpO2 94%   BMI 35.08 kg/m   Physical Exam   Abdomen soft, nondistended.  Tenderness palpation left lower quadrant.  ED Course/Procedures     Procedures  MDM   PLAN: Patient pending CT abd/pelvis for evaluation of intra-abdominal process.   MDM:  CT abd/pelvis shows findings concerning for acute diverticulitis. Given age and pain, will plan for admission.   Discussed patient with Dr. Jonelle Sidle (hosiptalist). Will admit.   1. Acute diverticulitis    Portions of this note were generated with Dragon dictation software. Dictation errors may occur despite best attempts at proofreading.     Volanda Napoleon, PA-C 02/07/19 2022    Lajean Saver, MD 02/08/19 1326

## 2019-02-07 NOTE — ED Provider Notes (Addendum)
[facility] EMERGENCY DEPARTMENT Provider Note   CSN: [id number] Arrival date & time: 02/07/19  1340     History   Chief Complaint Chief Complaint  Patient presents with  . Abdominal Pain    HPI [name] is a 85 y.o. male.     Patient c/o LLQ abd pain for the past 2-3 days. Symptoms acute onset, moderate-severe, constant, persistent, felt worse today, non radiating. Hx kidney stones and hx diverticula - pt unsure if same. Denies dysuria or hematuria. +nausea. No vomiting. Normal bm in past day. Denies scrotal or testicular pain or swelling. No fever or chills. No back pain.   The history is provided by the patient and the spouse.  Abdominal Pain Associated symptoms: nausea   Associated symptoms: no chest pain, no cough, no dysuria, no fever, no hematuria, no shortness of breath, no sore throat and no vomiting     Past Medical History:  Diagnosis Date  . Anginal pain ([place])   . Anxiety    " OCCASIONAL"  . Arthritis   . Asthma due to seasonal allergies    uses inhalers prn  . Carotid artery occlusion    Left  . Complication of anesthesia    " had tremors after prostate surgery  . Coronary artery disease   . Diverticulitis    recurrent  . Family history of anesthesia complication    MOTHER   . GERD (gastroesophageal reflux disease)   . H/O hiatal hernia   . Hearing aid worn   . Hyperlipemia   . Hypertension   . Kidney stone    lithotripsy 6962 w complications, req stents, Dr Rosana Hoes  . Prostate CA ([place])   . Shortness of breath   . Thyroid disease   . TIA (transient ischemic attack)   . Tinnitus     Patient Active Problem List   Diagnosis Date Noted  . Angina pectoris, variant ([place]) 05/19/2018  . Poor compliance with CPAP treatment 04/20/2018  . Excessive daytime sleepiness 03/22/2018  . Poor sleep hygiene 03/22/2018  . Ventral hernia without obstruction or gangrene 10/09/2017  . TIA (transient ischemic attack)   . Kidney stone   .  Hypertension   . Hearing aid worn   . Coronary artery disease   . Asthma due to seasonal allergies   . Arthritis   . Erosive gastropathy 03/26/2017  . Acute blood loss anemia 03/25/2017  . Melena 03/24/2017  . CKD (chronic kidney disease), stage III ([place]) 03/24/2017  . AF (paroxysmal atrial fibrillation) ([place]) 03/24/2017  . Angina pectoris ([place]) 10/09/2015  . Essential hypertension   . Hyperlipemia   . Carotid artery occlusion   . Dizziness 08/09/2015  . Gait instability 05/30/2015  . Hypothyroidism, acquired 10/26/2013  . OSA (obstructive sleep apnea) 10/18/2013  . Benign localized hyperplasia of prostate with urinary obstruction and other lower urinary tract symptoms (LUTS)(600.21) 12/21/2012  . Carcinoma in situ of prostate 12/21/2012  . Left shoulder pain 08/08/2012  . Obesity 07/24/2011  . Generalized anxiety disorder 07/24/2011  . DIVERTICULOSIS OF COLON 12/25/2009  . DERMATITIS, SEBORRHEIC 12/25/2009  . 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 Memory loss 08/14/2009  . VENOUS INSUFFICIENCY, CHRONIC 09/09/2006  . GASTROESOPHAGEAL REFLUX, NO ESOPHAGITIS 09/09/2006  . HERNIA, HIATAL, NONCONGENITAL 09/09/2006  . INSOMNIA NOS 09/09/2006    Past Surgical History:  Procedure Laterality Date  . CARDIAC CATHETERIZATION  2008   minimal dz, Dr Cathie Olden  . CARDIAC CATHETERIZATION N/A 10/09/2015   Procedure: Left Heart  Cath and Coronary Angiography;  Surgeon: Peter M Martinique, MD;  Location: [facility];  Service: Cardiovascular;  Laterality: N/A;  . CARDIAC CATHETERIZATION N/A 10/09/2015   Procedure: Intravascular Pressure Wire/FFR Study;  Surgeon: Peter M Martinique, MD;  Location: [facility];  Service: Cardiovascular;  Laterality: N/A;  . CARDIAC CATHETERIZATION N/A 10/09/2015   Procedure: Coronary Stent Intervention;  Surgeon: Peter M Martinique, MD;  Location: [facility];  Service: Cardiovascular;  Laterality: N/A;  . CAROTID ENDARTERECTOMY  Left   1998  .  CHOLECYSTECTOMY    . CORONARY STENT PLACEMENT  10/09/2015   DES to RCA  . ESOPHAGOGASTRODUODENOSCOPY (EGD) WITH PROPOFOL Left 03/26/2017   Procedure: ESOPHAGOGASTRODUODENOSCOPY (EGD) WITH PROPOFOL;  Surgeon: Arta Silence, MD;  Location: [facility];  Service: Endoscopy;  Laterality: Left;  . LEFT HEART CATHETERIZATION WITH CORONARY ANGIOGRAM N/A 09/15/2013   Procedure: LEFT HEART CATHETERIZATION WITH CORONARY ANGIOGRAM;  Surgeon: Peter M Martinique, MD;  Location: [facility] CATH LAB;  Service: Cardiovascular;  Laterality: N/A;  . PROSTATECTOMY  1998   radical for prostate cancer        Home Medications    Prior to Admission medications   Medication Sig Start Date End Date Taking? Authorizing Provider  albuterol (VENTOLIN HFA) 108 (90 Base) MCG/ACT inhaler Inhale 2 puffs into the lungs every 6 (six) hours as needed for wheezing or shortness of breath. 08/18/17   Copland, Gay Filler, MD  Artificial Tear Solution (BION TEARS OP) Place 1 drop into both eyes daily as needed (dry eyes).    [provider]  beclomethasone (QVAR REDIHALER) 80 MCG/ACT inhaler Inhale 1 puff into the lungs 2 (two) times daily. 06/02/18   Copland, Gay Filler, MD  carvedilol (COREG) 6.25 MG tablet TAKE 1 TABLET BY MOUTH TWICE A DAY WITH A MEAL 01/31/18   Copland, Gay Filler, MD  clobetasol (TEMOVATE) 0.05 % external solution APPLY TO SCALP DAILY AS NEEDED 10/06/17   [provider]  ezetimibe (ZETIA) 10 MG tablet TAKE 1 TABLET BY MOUTH EVERY DAY 06/30/18   Martinique, Peter M, MD  furosemide (LASIX) 20 MG tablet Take 1 tablet (20 mg total) by mouth daily. 05/11/18   Barrett, Evelene Croon, PA-C  HYDROcodone-acetaminophen (NORCO/VICODIN) 5-325 MG tablet Take 0.5-1 tablets by mouth 2 (two) times daily as needed (pain). Patient taking differently: Take 0.5-1 tablets by mouth once a week.  11/23/16   Copland, Gay Filler, MD  hydrocortisone 2.5 % cream Apply 1 application topically 2 (two) times daily.    [provider]   ipratropium (ATROVENT) 0.03 % nasal spray PLACE 2 SPRAYS INTO THE NOSE 4 (FOUR) TIMES DAILY. 05/06/18   Copland, Gay Filler, MD  levothyroxine (SYNTHROID, LEVOTHROID) 137 MCG tablet Take 1 tablet (137 mcg total) by mouth daily. 09/14/18   Copland, Gay Filler, MD  losartan (COZAAR) 50 MG tablet Take 1 tablet (50 mg total) by mouth daily. 05/11/18 08/09/18  Barrett, Evelene Croon, PA-C  nitroGLYCERIN (NITROLINGUAL) 0.4 MG/SPRAY spray Place 1 spray under the tongue every 5 (five) minutes x 3 doses as needed for chest pain. 07/18/18   Martinique, Peter M, MD  ondansetron (ZOFRAN-ODT) 4 MG disintegrating tablet Take 1 tablet (4 mg total) by mouth every 8 (eight) hours as needed for nausea or vomiting. 12/26/18   Wendling, Crosby Oyster, DO  polyethylene glycol (MIRALAX / GLYCOLAX) packet Take 17 g by mouth daily. Mix in 8 oz liquid and drink    [provider]  Rivaroxaban (XARELTO) 15  MG TABS tablet Take 1 tablet (15 mg total) by mouth daily with supper. 10/17/18   Martinique, Peter M, MD  triamcinolone cream (KENALOG) 0.1 % Apply 1 application topically daily as needed (rash).    [provider]    Family History Family History  Problem Relation Age of Onset  . Heart disease Mother   . Heart disease Father   . Kidney failure Father   . Heart disease Sister   . Heart attack Sister   . Dementia Sister   . Sjogren's syndrome Child   . Hashimoto's thyroiditis Child   . CAD Child     Social History Social History   Tobacco Use  . Smoking status: Former Smoker    Packs/day: 2.50    Years: 30.00    Pack years: 75.00    Types: Cigarettes    Quit date: 08/28/1985    Years since quitting: 33.4  . Smokeless tobacco: Never Used  Substance Use Topics  . Alcohol use: Yes    Alcohol/week: 2.0 standard drinks    Types: 2 Standard drinks or equivalent per week    Comment: 1 glass wine/week (prior 1 glass martini with dinner)  . Drug use: No     Allergies   Ciprofloxacin, Codeine, Flexeril  [cyclobenzaprine], Imdur [isosorbide nitrate], Methocarbamol, Sulfa antibiotics, Sulfamethoxazole, Sulfonamide derivatives, and Flagyl [metronidazole]   Review of Systems Review of Systems  Constitutional: Negative for fever.  HENT: Negative for sore throat.   Eyes: Negative for redness.  Respiratory: Negative for cough and shortness of breath.   Cardiovascular: Negative for chest pain.  Gastrointestinal: Positive for abdominal pain and nausea. Negative for vomiting.  Endocrine: Negative for polyuria.  Genitourinary: Negative for dysuria, flank pain and hematuria.  Musculoskeletal: Negative for back pain and neck pain.  Skin: Negative for rash.  Neurological: Negative for headaches.  Hematological: Does not bruise/bleed easily.  Psychiatric/Behavioral: Negative for confusion.     Physical Exam Updated Vital Signs BP (!) 155/66 (BP Location: Right Arm)   Pulse 90   Temp 98.3 F (36.8 C) (Oral)   Resp 16   SpO2 95%   Physical Exam Vitals signs and nursing note reviewed.  Constitutional:      Appearance: Normal appearance. He is well-developed.  HENT:     Head: Atraumatic.     Nose: Nose normal.     Mouth/Throat:     Mouth: Mucous membranes are moist.     Pharynx: Oropharynx is clear.  Eyes:     General: No scleral icterus.    Conjunctiva/sclera: Conjunctivae normal.  Neck:     Musculoskeletal: Normal range of motion and neck supple. No neck rigidity.     Trachea: No tracheal deviation.  Cardiovascular:     Rate and Rhythm: Normal rate and regular rhythm.     Pulses: Normal pulses.     Heart sounds: Normal heart sounds. No murmur. No friction rub. No gallop.   Pulmonary:     Effort: Pulmonary effort is normal. No accessory muscle usage or respiratory distress.     Breath sounds: Normal breath sounds.  Abdominal:     General: Bowel sounds are normal. There is no distension.     Palpations: Abdomen is soft. There is no mass.     Tenderness: There is abdominal  tenderness. There is no guarding or rebound.     Hernia: No hernia is present.     Comments: Moderate LLQ tenderness.   Genitourinary:    Comments: No cva tenderness.  Musculoskeletal:        General: No swelling.  Skin:    General: Skin is warm and dry.     Findings: No rash.  Neurological:     Mental Status: He is alert.     Comments: Alert, speech clear.   Psychiatric:        Mood and Affect: Mood normal.      ED Treatments / Results  Labs (all labs ordered are listed, but only abnormal results are displayed) Results for orders placed or performed during the hospital encounter of 02/07/19  Lipase, blood  Result Value Ref Range   Lipase 26 11 - 51 U/L  Comprehensive metabolic panel  Result Value Ref Range   Sodium 136 135 - 145 mmol/L   Potassium 4.3 3.5 - 5.1 mmol/L   Chloride 102 98 - 111 mmol/L   CO2 23 22 - 32 mmol/L   Glucose, Bld 118 (H) 70 - 99 mg/dL   BUN 22 8 - 23 mg/dL   Creatinine, Ser 1.87 (H) 0.61 - 1.24 mg/dL   Calcium 9.5 8.9 - 10.3 mg/dL   Total Protein 7.6 6.5 - 8.1 g/dL   Albumin 4.1 3.5 - 5.0 g/dL   AST 32 15 - 41 U/L   ALT 41 0 - 44 U/L   Alkaline Phosphatase 52 38 - 126 U/L   Total Bilirubin 3.4 (H) 0.3 - 1.2 mg/dL   GFR calc non Af Amer 33 (L) >60 mL/min   GFR calc Af Amer 38 (L) >60 mL/min   Anion gap 11 5 - 15  CBC  Result Value Ref Range   WBC 9.9 4.0 - 10.5 K/uL   RBC 4.26 4.22 - 5.81 MIL/uL   Hemoglobin 15.3 13.0 - 17.0 g/dL   HCT 43.8 39.0 - 52.0 %   MCV 102.8 (H) 80.0 - 100.0 fL   MCH 35.9 (H) 26.0 - 34.0 pg   MCHC 34.9 30.0 - 36.0 g/dL   RDW 13.7 11.5 - 15.5 %   Platelets 136 (L) 150 - 400 K/uL   nRBC 0.0 0.0 - 0.2 %    EKG None  Radiology Ct Abdomen Pelvis Wo Contrast  Result Date: 02/07/2019 CLINICAL DATA:  Left lower quadrant abdomen pain. EXAM: CT ABDOMEN AND PELVIS WITHOUT CONTRAST TECHNIQUE: Multidetector CT imaging of the abdomen and pelvis was performed following the standard protocol without IV contrast.  COMPARISON:  September 29, 2017 FINDINGS: Lower chest: No acute abnormality. Hepatobiliary: Diffuse low density of the liver is identified without focal liver lesion noted. Previously noted small low-density in the left lobe liver is not definitely seen on this noncontrast exam. Patient status post prior cholecystectomy. The biliary tree is normal. Pancreas: Unremarkable. No pancreatic ductal dilatation or surrounding inflammatory changes. Spleen: Normal in size without focal abnormality. Adrenals/Urinary Tract: The bilateral adrenal glands are normal. The bilateral kidneys are atrophic, right worse than left. There are nonobstructing stones in the right kidney. There is no hydronephrosis bilaterally. The bladder is normal. Stomach/Bowel: There is marked bowel wall thickening with surrounding inflammation at the sigmoid colon descending colon junction. There is diverticulosis of colon. There is no small bowel obstruction. The appendix is normal. The stomach is normal. Vascular/Lymphatic: Aortic atherosclerosis. No enlarged abdominal or pelvic lymph nodes. Reproductive: Patient status post prior prostatectomy. Other: None. Musculoskeletal: Degenerative joint changes of spine are identified. IMPRESSION: Marked bowel wall thickening with surrounding inflammation at the sigmoid colon/descending colon junction. The findings can be seen in acute diverticulitis. Follow-up CT  scan after treatment is recommended to ensure no underlying mass is present in the sigmoid colon/descending colon junction. Electronically Signed   By: Abelardo Diesel M.D.   On: 02/07/2019 19:31    Procedures Procedures (including critical care time)  Medications Ordered in ED Medications  sodium chloride flush (NS) 0.9 % injection 3 mL (has no administration in time range)  morphine 4 MG/ML injection 4 mg (has no administration in time range)  ondansetron (ZOFRAN) injection 4 mg (has no administration in time range)     Initial Impression /  Assessment and Plan / ED Course  I have reviewed the triage vital signs and the nursing notes.  Pertinent labs & imaging results that were available during my care of the patient were reviewed by me and considered in my medical decision making (see chart for details).  Iv ns. Stat labs. CT abd/pelvis ordered.   Morphine iv for pain.   Reviewed nursing notes and prior charts for additional history.   Labs reviewed by me - wbc 9.9, upper normal. CRI.   Non contrast ct pending.   CT reviewed by me - concerning for diverticulitis.   Zosyn iv.   Hospitalists consulted for admission.      Final Clinical Impressions(s) / ED Diagnoses   Final diagnoses:  None    ED Discharge Orders    None         Lajean Saver, MD 02/07/19 [phone]

## 2019-02-07 NOTE — Progress Notes (Signed)
Pharmacy Antibiotic Note  [name] is a 85 y.o. male admitted on 02/07/2019 with intra-abdominal infection. Pt sent here from UC d/t LLQ pain that started on 7/25. CT performed and is concerning for diverticulitis. Pt is afebrile and WBC 9.9. Pharmacy has been consulted for zosyn dosing.  Plan: Zosyn 3.375g IV x1 (30 minute infusion) then zosyn 3.375g IV q8h (extended infusion) Monitor renal function and clinical progression F/u C&S     Temp (24hrs), Avg:98.3 F (36.8 C), Min:98.3 F (36.8 C), Max:98.3 F (36.8 C)  Recent Labs  Lab 02/07/19 1420  WBC 9.9  CREATININE 1.87*    Estimated Creatinine Clearance: 34 mL/min (A) (by C-G formula based on SCr of 1.87 mg/dL (H)).    Allergies  Allergen Reactions  . Ciprofloxacin Other (See Comments)    Hallucinations or jitteriness  . Codeine Other (See Comments)    Hallucinations   . Flexeril [Cyclobenzaprine] Other (See Comments)    "Made me feel goofy"  . Imdur [Isosorbide Nitrate] Other (See Comments)    Caused headaches  . Methocarbamol Other (See Comments)    "Made me feel goofy"  . Sulfa Antibiotics Other (See Comments)    Chills and shaking- "serum sickness"   . Sulfamethoxazole Other (See Comments)    Chills and shaking- "serum sickness"  . Sulfonamide Derivatives Other (See Comments)    Chills and shaking "serum sickness"  . Flagyl [Metronidazole] Rash    Antimicrobials this admission: Zosyn 7/28 >>   Dose adjustments this admission: n/a  Microbiology results: Pending  Thank you for allowing pharmacy to be a part of this patient's care.  [name] 02/07/2019 8:12 PM

## 2019-02-07 NOTE — ED Notes (Signed)
Pt given a urinal and notified that we need a urine sample.

## 2019-02-07 NOTE — ED Triage Notes (Signed)
Pt was referred to ED from [facility] due to LLQ pain since july 25th. Hx of diverticulitis. According to pts paperwork pt was sent here to "rule out mesentric ischemia".  Pt also reports he has been alternating between having constipation and then will have diarrhea. Denies any vomiting.

## 2019-02-08 ENCOUNTER — Encounter (HOSPITAL_COMMUNITY): Payer: Self-pay | Admitting: Internal Medicine

## 2019-02-08 LAB — CBC
HCT: 37.9 % — ABNORMAL LOW (ref 39.0–52.0)
Hemoglobin: 13.4 g/dL (ref 13.0–17.0)
MCH: 36.5 pg — ABNORMAL HIGH (ref 26.0–34.0)
MCHC: 35.4 g/dL (ref 30.0–36.0)
MCV: 103.3 fL — ABNORMAL HIGH (ref 80.0–100.0)
Platelets: 106 10*3/uL — ABNORMAL LOW (ref 150–400)
RBC: 3.67 MIL/uL — ABNORMAL LOW (ref 4.22–5.81)
RDW: 13.8 % (ref 11.5–15.5)
WBC: 7.5 10*3/uL (ref 4.0–10.5)
nRBC: 0 % (ref 0.0–0.2)

## 2019-02-08 MED ORDER — ONDANSETRON HCL 4 MG/2ML IJ SOLN
4.0000 mg | Freq: Four times a day (QID) | INTRAMUSCULAR | Status: DC | PRN
  Administered 2019-02-08: 4 mg via INTRAVENOUS
  Filled 2019-02-08: qty 2

## 2019-02-08 MED ORDER — CARVEDILOL 6.25 MG PO TABS
6.2500 mg | ORAL_TABLET | Freq: Two times a day (BID) | ORAL | Status: DC
  Administered 2019-02-08 – 2019-02-09 (×2): 6.25 mg via ORAL
  Filled 2019-02-08 (×2): qty 1

## 2019-02-08 MED ORDER — DEXTROSE-NACL 5-0.9 % IV SOLN
INTRAVENOUS | Status: DC
  Administered 2019-02-08 (×2): via INTRAVENOUS

## 2019-02-08 MED ORDER — ONDANSETRON HCL 4 MG PO TABS: 4.0000 mg | ORAL_TABLET | Freq: Four times a day (QID) | ORAL | Status: DC | PRN

## 2019-02-08 MED ORDER — HEPARIN SODIUM (PORCINE) 5000 UNIT/ML IJ SOLN
5000.0000 [IU] | Freq: Three times a day (TID) | INTRAMUSCULAR | Status: DC
  Administered 2019-02-08 – 2019-02-09 (×4): 5000 [IU] via SUBCUTANEOUS
  Filled 2019-02-08 (×4): qty 1

## 2019-02-08 MED ORDER — KETOROLAC TROMETHAMINE 15 MG/ML IJ SOLN
15.0000 mg | Freq: Four times a day (QID) | INTRAMUSCULAR | Status: DC | PRN
  Administered 2019-02-08 – 2019-02-09 (×2): 15 mg via INTRAVENOUS
  Filled 2019-02-08 (×2): qty 1

## 2019-02-08 NOTE — Progress Notes (Signed)
Patient admitted to 3W-34, patient alert & orientated to the room and the importance of calling for assistance.

## 2019-02-08 NOTE — Progress Notes (Signed)
[facility] PROGRESS NOTE  LYNARD POSTLEWAIT NOM:[id number] DOB: Oct 19, 1933 DOA: 02/07/2019 PCP: [name], MD  Assessment/Plan:  #1 acute diverticulitis:  CT reveals marked bowel wall thickening with surrounding inflammation at the sigmoid colon/descending colon junction. The findings can be seen in acute diverticulitis. Follow-up CT scan after treatment is recommended to ensure no underlying mass is present in the sigmoidcolon/descending colon junction. Pain improved this am. Tolerating clear liquids.  Allergic to quinolones. -continue zosyn -advance diet as tolerated  #2 GERD: Continue PPIs.  #3 essential hypertension: Controlled. Home meds include coreg, lasix, losartan. -resume coreg -hold lasix and losartan for now  #4 hypothyroidism:  TSH in march 5.7. Continue levothyroxine.  #5 hyperlipidemia: Continue home regimen.  #6 morbid obesity: Dietary counseling offered.  BMI 35.08  #7 chronic kidney disease stage III:  creatinine 1.87 on admission. No bmet this am. Chart review indicates baseline 1.5-1.6. -gentle iv fluids -hold nephrotoxins -recheck this afternoon and in am  #8 paroxysmal atrial fibrillation: Patient currently in sinus rhythm. [place] score 5. Was previously taking Anda Kraft but stopped.  -continue home BB   Code Status: full Family Communication: wife on phone while in room Disposition Plan: home hopefully tomorrow   Consultants:    Procedures:    Antibiotics:  zosyn  HPI/Subjective: Awake alert reports abdominal pain "better"  85 yo very HOH admitted diverticulitis. No fever. Pain managed. Clear liquids.   Objective: Vitals:   02/08/19 0832 02/08/19 1203  BP: 134/68 131/61  Pulse: 74 68  Resp: 18 18  Temp: 97.7 F (36.5 C) 97.6 F (36.4 C)  SpO2: 97% 97%    Intake/Output Summary (Last 24 hours) at 02/08/2019 1219 Last data filed at 02/08/2019 0900 Gross per 24 hour  Intake 240 ml  Output -  Net 240 ml   Filed  Weights   02/07/19 2000  Weight: 101.6 kg    Exam:   General:  Awake alert very HOH no acute distress  Cardiovascular: rrr no mgr no LE edema  Respiratory: normal effort BS clear bilaterally no wheeze  Abdomen: slightly distended sluggish BS tender RUQ to palpation no guarding or rebounding tender lower quadrants with position change  Musculoskeletal:  Joints without swelling/erythema  Data Reviewed: Basic Metabolic Panel: Recent Labs  Lab 02/07/19 1420  NA 136  K 4.3  CL 102  CO2 23  GLUCOSE 118*  BUN 22  CREATININE 1.87*  CALCIUM 9.5   Liver Function Tests: Recent Labs  Lab 02/07/19 1420  AST 32  ALT 41  ALKPHOS 52  BILITOT 3.4*  PROT 7.6  ALBUMIN 4.1   Recent Labs  Lab 02/07/19 1420  LIPASE 26   No results for input(s): AMMONIA in the last 168 hours. CBC: Recent Labs  Lab 02/07/19 1420 02/08/19 0231  WBC 9.9 7.5  HGB 15.3 13.4  HCT 43.8 37.9*  MCV 102.8* 103.3*  PLT 136* 106*   Cardiac Enzymes: No results for input(s): CKTOTAL, CKMB, CKMBINDEX, TROPONINI in the last 168 hours. BNP (last 3 results) No results for input(s): BNP in the last 8760 hours.  ProBNP (last 3 results) No results for input(s): PROBNP in the last 8760 hours.  CBG: No results for input(s): GLUCAP in the last 168 hours.  Recent Results (from the past 240 hour(s))  SARS Coronavirus 2 (CEPHEID - Performed in [place] hospital lab), Hosp Order     Status: None   Collection Time: 02/07/19  8:09 PM   Specimen: Nasopharyngeal Swab  Result Value Ref  Range Status   SARS Coronavirus 2 NEGATIVE NEGATIVE Final    Comment: (NOTE) If result is NEGATIVE SARS-CoV-2 target nucleic acids are NOT DETECTED. The SARS-CoV-2 RNA is generally detectable in upper and lower  respiratory specimens during the acute phase of infection. The lowest  concentration of SARS-CoV-2 viral copies this assay can detect is 250  copies / mL. A negative result does not preclude SARS-CoV-2 infection   and should not be used as the sole basis for treatment or other  patient management decisions.  A negative result may occur with  improper specimen collection / handling, submission of specimen other  than nasopharyngeal swab, presence of viral mutation(s) within the  areas targeted by this assay, and inadequate number of viral copies  (<250 copies / mL). A negative result must be combined with clinical  observations, patient history, and epidemiological information. If result is POSITIVE SARS-CoV-2 target nucleic acids are DETECTED. The SARS-CoV-2 RNA is generally detectable in upper and lower  respiratory specimens dur ing the acute phase of infection.  Positive  results are indicative of active infection with SARS-CoV-2.  Clinical  correlation with patient history and other diagnostic information is  necessary to determine patient infection status.  Positive results do  not rule out bacterial infection or co-infection with other viruses. If result is PRESUMPTIVE POSTIVE SARS-CoV-2 nucleic acids MAY BE PRESENT.   A presumptive positive result was obtained on the submitted specimen  and confirmed on repeat testing.  While 2019 novel coronavirus  (SARS-CoV-2) nucleic acids may be present in the submitted sample  additional confirmatory testing may be necessary for epidemiological  and / or clinical management purposes  to differentiate between  SARS-CoV-2 and other Sarbecovirus currently known to infect humans.  If clinically indicated additional testing with an alternate test  methodology [phone]) is advised. The SARS-CoV-2 RNA is generally  detectable in upper and lower respiratory sp ecimens during the acute  phase of infection. The expected result is Negative. Fact Sheet for Patients:  StrictlyIdeas.no Fact Sheet for Healthcare Providers: BankingDealers.co.za This test is not yet approved or cleared by the [place] FDA  and has been authorized for detection and/or diagnosis of SARS-CoV-2 by FDA under an Emergency Use Authorization (EUA).  This EUA will remain in effect (meaning this test can be used) for the duration of the COVID-19 declaration under Section 564(b)(1) of the Act, 21 U.S.C. section 360bbb-3(b)(1), unless the authorization is terminated or revoked sooner. Performed at [facility], [place] [address], [place], [place] [address]      Studies: Ct Abdomen Pelvis Wo Contrast  Result Date: 02/07/2019 CLINICAL DATA:  Left lower quadrant abdomen pain. EXAM: CT ABDOMEN AND PELVIS WITHOUT CONTRAST TECHNIQUE: Multidetector CT imaging of the abdomen and pelvis was performed following the standard protocol without IV contrast. COMPARISON:  September 29, 2017 FINDINGS: Lower chest: No acute abnormality. Hepatobiliary: Diffuse low density of the liver is identified without focal liver lesion noted. Previously noted small low-density in the left lobe liver is not definitely seen on this noncontrast exam. Patient status post prior cholecystectomy. The biliary tree is normal. Pancreas: Unremarkable. No pancreatic ductal dilatation or surrounding inflammatory changes. Spleen: Normal in size without focal abnormality. Adrenals/Urinary Tract: The bilateral adrenal glands are normal. The bilateral kidneys are atrophic, right worse than left. There are nonobstructing stones in the right kidney. There is no hydronephrosis bilaterally. The bladder is normal. Stomach/Bowel: There is marked bowel wall thickening with surrounding inflammation at the sigmoid colon  descending colon junction. There is diverticulosis of colon. There is no small bowel obstruction. The appendix is normal. The stomach is normal. Vascular/Lymphatic: Aortic atherosclerosis. No enlarged abdominal or pelvic lymph nodes. Reproductive: Patient status post prior prostatectomy. Other: None. Musculoskeletal: Degenerative joint changes of spine are identified.  IMPRESSION: Marked bowel wall thickening with surrounding inflammation at the sigmoid colon/descending colon junction. The findings can be seen in acute diverticulitis. Follow-up CT scan after treatment is recommended to ensure no underlying mass is present in the sigmoid colon/descending colon junction. Electronically Signed   By: Abelardo Diesel M.D.   On: 02/07/2019 19:31    Scheduled Meds: . heparin  5,000 Units Subcutaneous Q8H   Continuous Infusions: . dextrose 5 % and 0.9% NaCl 100 mL/hr at 02/08/19 0104  . piperacillin-tazobactam (ZOSYN)  IV 3.375 g (02/08/19 0512)    Principal Problem:   Acute diverticulitis Active Problems:   Essential hypertension   CKD (chronic kidney disease), stage III (HCC)   GASTROESOPHAGEAL REFLUX, NO ESOPHAGITIS   Generalized anxiety disorder   AF (paroxysmal atrial fibrillation) (HCC)   Obesity   Carcinoma in situ of prostate   OSA (obstructive sleep apnea)   Hypothyroidism, acquired   Hyperlipemia    Time spent: 86 minutes    [facility]  If 7PM-7AM, please contact night-coverage at [email or web address], password [facility] 02/08/2019, 12:19 PM  LOS: 1 day

## 2019-02-09 LAB — COMPREHENSIVE METABOLIC PANEL
ALT: 75 U/L — ABNORMAL HIGH (ref 0–44)
AST: 56 U/L — ABNORMAL HIGH (ref 15–41)
Albumin: 2.8 g/dL — ABNORMAL LOW (ref 3.5–5.0)
Alkaline Phosphatase: 43 U/L (ref 38–126)
Anion gap: 6 (ref 5–15)
BUN: 18 mg/dL (ref 8–23)
CO2: 24 mmol/L (ref 22–32)
Calcium: 8.3 mg/dL — ABNORMAL LOW (ref 8.9–10.3)
Chloride: 108 mmol/L (ref 98–111)
Creatinine, Ser: 1.95 mg/dL — ABNORMAL HIGH (ref 0.61–1.24)
GFR calc Af Amer: 36 mL/min — ABNORMAL LOW (ref >60)
GFR calc non Af Amer: 31 mL/min — ABNORMAL LOW (ref >60)
Glucose, Bld: 121 mg/dL — ABNORMAL HIGH (ref 70–99)
Potassium: 3.6 mmol/L (ref 3.5–5.1)
Sodium: 138 mmol/L (ref 135–145)
Total Bilirubin: 2 mg/dL — ABNORMAL HIGH (ref 0.3–1.2)
Total Protein: 5.7 g/dL — ABNORMAL LOW (ref 6.5–8.1)

## 2019-02-09 MED ORDER — SODIUM CHLORIDE 0.9 % IV BOLUS
250.0000 mL | Freq: Once | INTRAVENOUS | Status: AC
  Administered 2019-02-09: 250 mL via INTRAVENOUS

## 2019-02-09 MED ORDER — LOSARTAN POTASSIUM 25 MG PO TABS: 25.0000 mg | ORAL_TABLET | Freq: Two times a day (BID) | ORAL | Status: DC

## 2019-02-09 MED ORDER — AMOXICILLIN-POT CLAVULANATE 875-125 MG PO TABS: 1.0000 | ORAL_TABLET | Freq: Two times a day (BID) | ORAL | 0 refills | Status: AC

## 2019-02-09 MED ORDER — FUROSEMIDE 20 MG PO TABS: 20.0000 mg | ORAL_TABLET | Freq: Every day | ORAL | 2 refills | Status: DC

## 2019-02-09 NOTE — Progress Notes (Signed)
Discharge instructions given. Pt verbalized understanding and all questions were answered.  

## 2019-02-09 NOTE — Progress Notes (Signed)
Patient ambulated with RN from room down the hallway and back to his room. Patient stable and only required standby assist.

## 2019-02-09 NOTE — Discharge Summary (Signed)
Physician Discharge Summary  [name] ZOX:[id number] DOB: [birth year] DOA: 02/07/2019  PCP: [name], MD  Admit date: 02/07/2019 Discharge date: 02/09/2019  Time spent: 45 minutes  Recommendations for Outpatient Follow-up:  1. Follow up with PCP 1-2 weeks for evaluation of symptoms. Recommend cmet to evaluate kidney function and electrolytes and LFT's. Consider repeat CT once treatment finished to ensure no underlying mass is present in the sigmoid colon/descending colon junction   Discharge Diagnoses:  Principal Problem:   Acute diverticulitis Active Problems:   Essential hypertension   CKD (chronic kidney disease), stage III (HCC)   GASTROESOPHAGEAL REFLUX, NO ESOPHAGITIS   Generalized anxiety disorder   AF (paroxysmal atrial fibrillation) (HCC)   Obesity   Carcinoma in situ of prostate   OSA (obstructive sleep apnea)   Hypothyroidism, acquired   Hyperlipemia   Discharge Condition: stable   Diet recommendation: full liquids to advance slowly as tolerated  [employer]   02/07/19 2000  Weight: 101.6 kg    History of present illness:  [name] is a 85 y.o. male with medical history significant of diverticulitis, coronary artery disease, asthma, carotid artery occlusion, GERD, hypertension, hyperlipidemia hypothyroidism, and prostate cancer who  had previous work-up for diverticulitis and has been trying to do well at home but started having another abdominal pain 7/28.  Pain  rated as 9 out of 10 in the left lower quadrant.  Associated with some nausea but no vomiting.  Denied any fever or chills denied any diarrhea denied any melena and no bright red blood per rectum.  Patient was seen and evaluated and found to have acute diverticulitis.  He had been unable to manage this at home.  Hospital Course:   #1 acute diverticulitis: CT revealed marked bowel wall thickening with surrounding inflammation at the sigmoid colon/descending colon junction. The findings  can be seen in acute diverticulitis. He was provided iv fluids and clear liquids as well as zosyn. On day of discharge tolerating full liquid and pain controlled. Allergic to quinolones. Will discharge with augmentin   #2 GERD:Continue PPIs.  #3 essential hypertension: Controlled. Home meds include coreg, lasix, losartan. Will hold lasix and losartan until 02/11/19.   #4 hypothyroidism: TSH in march 5.7. Continue levothyroxine.  #5 hyperlipidemia:Continue home regimen.  #6 morbid obesity:Dietary counseling offered. BMI 35.08  #7 chronic kidney disease stage III: creatinine 1.95 at discharge. Chart review indicates baseline 1.5-1.6. he was given IV fluids and nephrotoxins held. Continue to hold lasix and losartan until 02/11/2019. Recommend bmet 1-2 weeks. Urine output good  #8 paroxysmal atrial fibrillation:Patient remained in sinus rhythm. [place] score 5. Was previously taking Anda Kraft but stopped. continue home BB Procedures:  Consultations:    Discharge Exam: Vitals:   02/09/19 0740 02/09/19 0839  BP: (!) 92/55 (!) 131/58  Pulse: 78 75  Resp: 18   Temp: 98.2 F (36.8 C)   SpO2: 92%     General: awake alert no acute distress Cardiovascular: rrr no mgr no LE edema Respiratory: normal effort BS clear bilaterally  Abd: obese slightly firm +BS mild diffuse tenderness to palpation. No guarding or rebounding  Discharge Instructions   Discharge Instructions    Call MD for:  persistant nausea and vomiting   Complete by: As directed    Call MD for:  severe uncontrolled pain   Complete by: As directed    Call MD for:  temperature >100.4   Complete by: As directed    Diet - low sodium heart healthy  Complete by: As directed    Discharge instructions   Complete by: As directed    Take medications as prescribed Advance diet slowly as tolerated Eat small frequent meals vs large 3 meals Follow up with PCP 1-2 weeks for evaluation of symptoms   Increase activity  slowly   Complete by: As directed      Allergies as of 02/09/2019      Reactions   Ciprofloxacin Other (See Comments)   Hallucinations or jitteriness   Codeine Other (See Comments)   Hallucinations   Flexeril [cyclobenzaprine] Other (See Comments)   "Made me feel goofy"   Imdur [isosorbide Nitrate] Other (See Comments)   Caused headaches   Methocarbamol Other (See Comments)   "Made me feel goofy"   Other Other (See Comments)   CANNOT HAVE ANY FOODS WITH SEEDS   Strawberry Extract Other (See Comments)   CANNOT HAVE ANY FOODS WITH SEEDS   Sulfa Antibiotics Other (See Comments)   Chills and shaking- "serum sickness"   Sulfamethoxazole Other (See Comments)   Chills and shaking- "serum sickness"   Sulfonamide Derivatives Other (See Comments)   Chills and shaking "serum sickness"   Tomato Other (See Comments)   CANNOT HAVE ANY FOODS WITH SEEDS   Flagyl [metronidazole] Rash      Medication List    STOP taking these medications   beclomethasone 80 MCG/ACT inhaler Commonly known as: Qvar RediHaler     TAKE these medications   albuterol 108 (90 Base) MCG/ACT inhaler Commonly known as: Ventolin HFA Inhale 2 puffs into the lungs every 6 (six) hours as needed for wheezing or shortness of breath.   amoxicillin-clavulanate 875-125 MG tablet Commonly known as: Augmentin Take 1 tablet by mouth 2 (two) times daily for 10 days.   carvedilol 6.25 MG tablet Commonly known as: COREG TAKE 1 TABLET BY MOUTH TWICE A DAY WITH A MEAL What changed: See the new instructions.   clobetasol 0.05 % external solution Commonly known as: TEMOVATE Apply 1 application topically daily.   ezetimibe 10 MG tablet Commonly known as: ZETIA TAKE 1 TABLET BY MOUTH EVERY DAY   furosemide 20 MG tablet Commonly known as: Lasix Take 1 tablet (20 mg total) by mouth daily. Start taking on: February 11, 2019 What changed: These instructions start on February 11, 2019. If you are unsure what to do until then, ask  your doctor or other care provider.   HYDROcodone-acetaminophen 5-325 MG tablet Commonly known as: NORCO/VICODIN Take 0.5-1 tablets by mouth 2 (two) times daily as needed (pain). What changed: reasons to take this   hydrocortisone 2.5 % cream Apply 1 application topically 2 (two) times daily.   ipratropium 0.03 % nasal spray Commonly known as: ATROVENT PLACE 2 SPRAYS INTO THE NOSE 4 (FOUR) TIMES DAILY. What changed:   how to take this  when to take this   levothyroxine 137 MCG tablet Commonly known as: SYNTHROID Take 1 tablet (137 mcg total) by mouth daily.   losartan 25 MG tablet Commonly known as: COZAAR Take 1 tablet (25 mg total) by mouth 2 (two) times a day. Start taking on: February 11, 2019 What changed:   These instructions start on February 11, 2019. If you are unsure what to do until then, ask your doctor or other care provider.  Another medication with the same name was removed. Continue taking this medication, and follow the directions you see here.   nitroGLYCERIN 0.4 MG/SPRAY spray Commonly known as: NITROLINGUAL Place 1 spray under the  tongue every 5 (five) minutes x 3 doses as needed for chest pain.   ondansetron 4 MG disintegrating tablet Commonly known as: ZOFRAN-ODT Take 1 tablet (4 mg total) by mouth every 8 (eight) hours as needed for nausea or vomiting.   Opcon-A 0.027-0.315 % Soln Generic drug: Naphazoline-Pheniramine Place 1-2 drops into both eyes 3 (three) times daily as needed (for itching, redness, and/or irritation).   polyethylene glycol 17 g packet Commonly known as: MIRALAX / GLYCOLAX Take 17 g by mouth daily as needed for mild constipation. Mix in 8 oz liquid and drink   Rivaroxaban 15 MG Tabs tablet Commonly known as: Xarelto Take 1 tablet (15 mg total) by mouth daily with supper.   triamcinolone cream 0.1 % Commonly known as: KENALOG Apply 1 application topically daily as needed (for rashes).      Allergies  Allergen Reactions   . Ciprofloxacin Other (See Comments)    Hallucinations or jitteriness  . Codeine Other (See Comments)    Hallucinations   . Flexeril [Cyclobenzaprine] Other (See Comments)    "Made me feel goofy"  . Imdur [Isosorbide Nitrate] Other (See Comments)    Caused headaches  . Methocarbamol Other (See Comments)    "Made me feel goofy"  . Other Other (See Comments)    CANNOT HAVE ANY FOODS WITH SEEDS   . Strawberry Extract Other (See Comments)    CANNOT HAVE ANY FOODS WITH SEEDS  . Sulfa Antibiotics Other (See Comments)    Chills and shaking- "serum sickness"   . Sulfamethoxazole Other (See Comments)    Chills and shaking- "serum sickness"  . Sulfonamide Derivatives Other (See Comments)    Chills and shaking "serum sickness"  . Tomato Other (See Comments)    CANNOT HAVE ANY FOODS WITH SEEDS  . Flagyl [Metronidazole] Rash      The results of significant diagnostics from this hospitalization (including imaging, microbiology, ancillary and laboratory) are listed below for reference.    Significant Diagnostic Studies: Ct Abdomen Pelvis Wo Contrast  Result Date: 02/07/2019 CLINICAL DATA:  Left lower quadrant abdomen pain. EXAM: CT ABDOMEN AND PELVIS WITHOUT CONTRAST TECHNIQUE: Multidetector CT imaging of the abdomen and pelvis was performed following the standard protocol without IV contrast. COMPARISON:  September 29, 2017 FINDINGS: Lower chest: No acute abnormality. Hepatobiliary: Diffuse low density of the liver is identified without focal liver lesion noted. Previously noted small low-density in the left lobe liver is not definitely seen on this noncontrast exam. Patient status post prior cholecystectomy. The biliary tree is normal. Pancreas: Unremarkable. No pancreatic ductal dilatation or surrounding inflammatory changes. Spleen: Normal in size without focal abnormality. Adrenals/Urinary Tract: The bilateral adrenal glands are normal. The bilateral kidneys are atrophic, right worse than  left. There are nonobstructing stones in the right kidney. There is no hydronephrosis bilaterally. The bladder is normal. Stomach/Bowel: There is marked bowel wall thickening with surrounding inflammation at the sigmoid colon descending colon junction. There is diverticulosis of colon. There is no small bowel obstruction. The appendix is normal. The stomach is normal. Vascular/Lymphatic: Aortic atherosclerosis. No enlarged abdominal or pelvic lymph nodes. Reproductive: Patient status post prior prostatectomy. Other: None. Musculoskeletal: Degenerative joint changes of spine are identified. IMPRESSION: Marked bowel wall thickening with surrounding inflammation at the sigmoid colon/descending colon junction. The findings can be seen in acute diverticulitis. Follow-up CT scan after treatment is recommended to ensure no underlying mass is present in the sigmoid colon/descending colon junction. Electronically Signed   By: Mallie Darting.D.  On: 02/07/2019 19:31    Microbiology: Recent Results (from the past 240 hour(s))  SARS Coronavirus 2 (CEPHEID - Performed in [place] hospital lab), Hosp Order     Status: None   Collection Time: 02/07/19  8:09 PM   Specimen: Nasopharyngeal Swab  Result Value Ref Range Status   SARS Coronavirus 2 NEGATIVE NEGATIVE Final    Comment: (NOTE) If result is NEGATIVE SARS-CoV-2 target nucleic acids are NOT DETECTED. The SARS-CoV-2 RNA is generally detectable in upper and lower  respiratory specimens during the acute phase of infection. The lowest  concentration of SARS-CoV-2 viral copies this assay can detect is 250  copies / mL. A negative result does not preclude SARS-CoV-2 infection  and should not be used as the sole basis for treatment or other  patient management decisions.  A negative result may occur with  improper specimen collection / handling, submission of specimen other  than nasopharyngeal swab, presence of viral mutation(s) within the  areas targeted  by this assay, and inadequate number of viral copies  (<250 copies / mL). A negative result must be combined with clinical  observations, patient history, and epidemiological information. If result is POSITIVE SARS-CoV-2 target nucleic acids are DETECTED. The SARS-CoV-2 RNA is generally detectable in upper and lower  respiratory specimens dur ing the acute phase of infection.  Positive  results are indicative of active infection with SARS-CoV-2.  Clinical  correlation with patient history and other diagnostic information is  necessary to determine patient infection status.  Positive results do  not rule out bacterial infection or co-infection with other viruses. If result is PRESUMPTIVE POSTIVE SARS-CoV-2 nucleic acids MAY BE PRESENT.   A presumptive positive result was obtained on the submitted specimen  and confirmed on repeat testing.  While 2019 novel coronavirus  (SARS-CoV-2) nucleic acids may be present in the submitted sample  additional confirmatory testing may be necessary for epidemiological  and / or clinical management purposes  to differentiate between  SARS-CoV-2 and other Sarbecovirus currently known to infect humans.  If clinically indicated additional testing with an alternate test  methodology [phone]) is advised. The SARS-CoV-2 RNA is generally  detectable in upper and lower respiratory sp ecimens during the acute  phase of infection. The expected result is Negative. Fact Sheet for Patients:  StrictlyIdeas.no Fact Sheet for Healthcare Providers: BankingDealers.co.za This test is not yet approved or cleared by the [place] FDA and has been authorized for detection and/or diagnosis of SARS-CoV-2 by FDA under an Emergency Use Authorization (EUA).  This EUA will remain in effect (meaning this test can be used) for the duration of the COVID-19 declaration under Section 564(b)(1) of the Act, 21 U.S.C. section  360bbb-3(b)(1), unless the authorization is terminated or revoked sooner. Performed at [facility], [place] [address] [place], [place] [address]      Labs: Basic Metabolic Panel: Recent Labs  Lab 02/07/19 1420 02/09/19 0349  NA 136 138  K 4.3 3.6  CL 102 108  CO2 23 24  GLUCOSE 118* 121*  BUN 22 18  CREATININE 1.87* 1.95*  CALCIUM 9.5 8.3*   Liver Function Tests: Recent Labs  Lab 02/07/19 1420 02/09/19 0349  AST 32 56*  ALT 41 75*  ALKPHOS 52 43  BILITOT 3.4* 2.0*  PROT 7.6 5.7*  ALBUMIN 4.1 2.8*   Recent Labs  Lab 02/07/19 1420  LIPASE 26   No results for input(s): AMMONIA in the last 168 hours. CBC: Recent Labs  Lab  02/07/19 1420 02/08/19 0231  WBC 9.9 7.5  HGB 15.3 13.4  HCT 43.8 37.9*  MCV 102.8* 103.3*  PLT 136* 106*   Cardiac Enzymes: No results for input(s): CKTOTAL, CKMB, CKMBINDEX, TROPONINI in the last 168 hours. BNP: BNP (last 3 results) No results for input(s): BNP in the last 8760 hours.  ProBNP (last 3 results) No results for input(s): PROBNP in the last 8760 hours.  CBG: No results for input(s): GLUCAP in the last 168 hours.     SignedRadene Gunning NP  [facility] 02/09/2019, 11:57 AM

## 2019-02-10 ENCOUNTER — Encounter: Payer: Self-pay | Admitting: Family Medicine

## 2019-02-10 ENCOUNTER — Ambulatory Visit (HOSPITAL_BASED_OUTPATIENT_CLINIC_OR_DEPARTMENT_OTHER)
Admission: RE | Admit: 2019-02-10 | Discharge: 2019-02-10 | Disposition: A | Discharge status: Home / Self Care | Payer: Medicare Other | Source: Ambulatory Visit | Attending: Family Medicine | Admitting: Family Medicine

## 2019-02-10 ENCOUNTER — Telehealth: Payer: Self-pay | Admitting: *Deleted

## 2019-02-10 ENCOUNTER — Ambulatory Visit (INDEPENDENT_AMBULATORY_CARE_PROVIDER_SITE_OTHER): Payer: Medicare Other | Admitting: Family Medicine

## 2019-02-10 ENCOUNTER — Other Ambulatory Visit: Payer: Self-pay

## 2019-02-10 VITALS — BP 110/62 | HR 78

## 2019-02-10 DIAGNOSIS — R0602 Shortness of breath: Secondary | ICD-10-CM | POA: Diagnosis not present

## 2019-02-10 DIAGNOSIS — K5792 Diverticulitis of intestine, part unspecified, without perforation or abscess without bleeding: Secondary | ICD-10-CM | POA: Diagnosis not present

## 2019-02-10 DIAGNOSIS — R059 Cough, unspecified: Secondary | ICD-10-CM

## 2019-02-10 DIAGNOSIS — R05 Cough: Secondary | ICD-10-CM

## 2019-02-10 NOTE — Progress Notes (Signed)
Virtual Visit via Video Note  I connected with [name] on 02/10/19 at  3:30 PM EDT by a video enabled telemedicine application and verified that I am speaking with the correct person using two identifiers.  Location: Patient: home  Provider: office    I discussed the limitations of evaluation and management by telemedicine and the availability of in person appointments. The patient expressed understanding and agreed to proceed.  History of Present Illness: Pt is home with his wife.  He was d/c from hosp yesterday on augmentin for diverticulitis  since being home he has had episodes of sob with laying down and a worsening cough   No fevers   Cough is not productive.  Pt has refused to go back to the ER     Observations/Objective: Vitals:   02/10/19 1519  BP: 110/62  Pulse: 78  pt has some coughing--  Dry  But otherwise is in NAD---- not sob sitting up talking to me    Assessment and Plan: 1. Cough Pt states it has been going on for months but recently worse Check cxr Pt is on abx for diverticulitis   - DG Chest 2 View; Future  2. Diverticulitis Finish abx  F/u GI   3. SOB (shortness of breath) Check xray  If sob worsens go to ER   Follow Up Instructions:    I discussed the assessment and treatment plan with the patient. The patient was provided an opportunity to ask questions and all were answered. The patient agreed with the plan and demonstrated an understanding of the instructions.   The patient was advised to call back or seek an in-person evaluation if the symptoms worsen or if the condition fails to improve as anticipated.  I provided 15 minutes of non-face-to-face time during this encounter.   Ann Held, DO

## 2019-02-10 NOTE — Telephone Encounter (Signed)
Transition Care Management Follow-up Telephone Call   Date discharged? 02/09/19   How have you been since you were released from the hospital? Sore and weak   Do you understand why you were in the hospital? yes   Do you understand the discharge instructions? yes   Where were you discharged to? Home with wife.   Items Reviewed:  Medications reviewed: yes, they just added abx  Allergies reviewed: yes  Dietary changes reviewed: yes  Referrals reviewed: yes   Functional Questionnaire:   Activities of Daily Living (ADLs):   He states they are independent in the following: ambulation, bathing and hygiene, feeding, continence, grooming, toileting and dressing States they require assistance with the following: na   Any transportation issues/concerns?: no   Any patient concerns? Still having some pain, but states pain is tolerable and does not need medicine. Pt c/o SOB occasionally when he lays down. Denies SOB at this time. Pt refuses to go to ER at this time, but agrees to virtual visit with Dr.Lowne @330  today. PCP out of office. Pt instructed to call 911 or go to ER if develops any SOB prior to PCP virtual visit. Pt agrees to plan.    Confirmed importance and date/time of follow-up visits scheduled yes  Confirmed with patient if condition begins to worsen call PCP or go to the ER.  Patient was given the office number and encouraged to call back with question or concerns.  : yes

## 2019-02-10 NOTE — Telephone Encounter (Signed)
Left message for pt to call office to schedule appt

## 2019-02-15 ENCOUNTER — Encounter: Payer: Self-pay | Admitting: Family

## 2019-02-15 ENCOUNTER — Ambulatory Visit (INDEPENDENT_AMBULATORY_CARE_PROVIDER_SITE_OTHER): Payer: Medicare Other | Admitting: Family

## 2019-02-15 ENCOUNTER — Other Ambulatory Visit: Payer: Self-pay

## 2019-02-15 ENCOUNTER — Ambulatory Visit (HOSPITAL_COMMUNITY)
Admission: RE | Admit: 2019-02-15 | Discharge: 2019-02-15 | Disposition: A | Discharge status: Home / Self Care | Payer: Medicare Other | Source: Ambulatory Visit | Attending: Family | Admitting: Family

## 2019-02-15 VITALS — BP 152/80 | HR 65 | Temp 97.9°F | Resp 14 | Ht 69.0 in | Wt 217.0 lb

## 2019-02-15 DIAGNOSIS — Z9889 Other specified postprocedural states: Secondary | ICD-10-CM

## 2019-02-15 DIAGNOSIS — I6523 Occlusion and stenosis of bilateral carotid arteries: Secondary | ICD-10-CM | POA: Diagnosis not present

## 2019-02-15 NOTE — Patient Instructions (Signed)

## 2019-02-15 NOTE — Progress Notes (Signed)
Chief Complaint: Follow up Extracranial Carotid Artery Stenosis   History of Present Illness  [name] is a 85 y.o. male who is status post left carotid endarterectomy in 1998by Dr. Amedeo Plenty, assisted by Dr. Scot Dock.  He had two preoperative TIA's, bothwith amaurosis fugax in the left eye. Pt denies any known subsequent TIA or stroke.  Dr. Scot Dock evaluated ptin February, 2017with a 60-79% right carotid stenosis. As this was asymptomatic, Dr. Dicksonrecommended follow up carotid duplex in 6 months. The patient was on aspirin and was also on a statin.  He has undergone PCTA in March 2017and has done well from that standpoint.   He does not seem to haveclaudication symptoms with walking. His wife states he is dyspneic with climbing stairs. He does havea stationary bike but does not use it.  He also seems to have arthritic pain.  He had a visit by physical therapist in March 2002, then due to [place] 21 wife advised no more visit.   He was hospitalized recently for diverticulitis.   Diabetic:no Tobacco QQI:WLNLGX smoker, quitin 1987, smoked 2-3 ppd, started at age 53  Pt meds include: Statin :no, may have had arthralgias with statin ASA:no Other anticoagulants/antiplatelets:Xarelto, pt states he had an episode of atrial fib    Past Medical History:  Diagnosis Date  . Anginal pain ([place])   . Anxiety    " OCCASIONAL"  . Arthritis   . Asthma due to seasonal allergies    uses inhalers prn  . Carotid artery occlusion    Left  . Complication of anesthesia    " had tremors after prostate surgery  . Coronary artery disease   . Diverticulitis    recurrent  . Family history of anesthesia complication    MOTHER   . GERD (gastroesophageal reflux disease)   . H/O hiatal hernia   . Hearing aid worn   . Hyperlipemia   . Hypertension   . Kidney stone    lithotripsy 2119 w complications, req stents, Dr Rosana Hoes  . Prostate CA ([place])   . Shortness of breath   .  Thyroid disease   . TIA (transient ischemic attack)   . Tinnitus     Social History Social History   Tobacco Use  . Smoking status: Former Smoker    Packs/day: 2.50    Years: 30.00    Pack years: 75.00    Types: Cigarettes    Quit date: 08/28/1985    Years since quitting: 33.4  . Smokeless tobacco: Never Used  Substance Use Topics  . Alcohol use: Yes    Alcohol/week: 2.0 standard drinks    Types: 2 Standard drinks or equivalent per week    Comment: 1 glass wine/week (prior 1 glass martini with dinner)  . Drug use: No    Family History Family History  Problem Relation Age of Onset  . Heart disease Mother   . Heart disease Father   . Kidney failure Father   . Heart disease Sister   . Heart attack Sister   . Dementia Sister   . Sjogren's syndrome Child   . Hashimoto's thyroiditis Child   . CAD Child     Surgical History Past Surgical History:  Procedure Laterality Date  . CARDIAC CATHETERIZATION  2008   minimal dz, Dr Cathie Olden  . CARDIAC CATHETERIZATION N/A 10/09/2015   Procedure: Left Heart Cath and Coronary Angiography;  Surgeon: Peter M Martinique, MD;  Location: [facility];  Service: Cardiovascular;  Laterality: N/A;  .  CARDIAC CATHETERIZATION N/A 10/09/2015   Procedure: Intravascular Pressure Wire/FFR Study;  Surgeon: Peter M Martinique, MD;  Location: [facility];  Service: Cardiovascular;  Laterality: N/A;  . CARDIAC CATHETERIZATION N/A 10/09/2015   Procedure: Coronary Stent Intervention;  Surgeon: Peter M Martinique, MD;  Location: [facility];  Service: Cardiovascular;  Laterality: N/A;  . CAROTID ENDARTERECTOMY  Left   1998  . CHOLECYSTECTOMY    . CORONARY STENT PLACEMENT  10/09/2015   DES to RCA  . ESOPHAGOGASTRODUODENOSCOPY (EGD) WITH PROPOFOL Left 03/26/2017   Procedure: ESOPHAGOGASTRODUODENOSCOPY (EGD) WITH PROPOFOL;  Surgeon: Arta Silence, MD;  Location: [facility];  Service: Endoscopy;  Laterality: Left;  . LEFT HEART CATHETERIZATION WITH  CORONARY ANGIOGRAM N/A 09/15/2013   Procedure: LEFT HEART CATHETERIZATION WITH CORONARY ANGIOGRAM;  Surgeon: Peter M Martinique, MD;  Location: [facility];  Service: Cardiovascular;  Laterality: N/A;  . PROSTATECTOMY  1998   radical for prostate cancer    Allergies  Allergen Reactions  . Ciprofloxacin Other (See Comments)    Hallucinations or jitteriness  . Codeine Other (See Comments)    Hallucinations   . Flexeril [Cyclobenzaprine] Other (See Comments)    "Made me feel goofy"  . Imdur [Isosorbide Nitrate] Other (See Comments)    Caused headaches  . Methocarbamol Other (See Comments)    "Made me feel goofy"  . Other Other (See Comments)    CANNOT HAVE ANY FOODS WITH SEEDS   . Strawberry Extract Other (See Comments)    CANNOT HAVE ANY FOODS WITH SEEDS  . Sulfa Antibiotics Other (See Comments)    Chills and shaking- "serum sickness"   . Sulfamethoxazole Other (See Comments)    Chills and shaking- "serum sickness"  . Sulfonamide Derivatives Other (See Comments)    Chills and shaking "serum sickness"  . Tomato Other (See Comments)    CANNOT HAVE ANY FOODS WITH SEEDS  . Flagyl [Metronidazole] Rash    Current Outpatient Medications  Medication Sig Dispense Refill  . albuterol (VENTOLIN HFA) 108 (90 Base) MCG/ACT inhaler Inhale 2 puffs into the lungs every 6 (six) hours as needed for wheezing or shortness of breath. 18 g 5  . amoxicillin-clavulanate (AUGMENTIN) 875-125 MG tablet Take 1 tablet by mouth 2 (two) times daily for 10 days. 20 tablet 0  . carvedilol (COREG) 6.25 MG tablet TAKE 1 TABLET BY MOUTH TWICE A DAY WITH A MEAL (Patient taking differently: Take 6.25 mg by mouth See admin instructions. Take 6.25 mg by mouth one to two times a day) 180 tablet 1  . clobetasol (TEMOVATE) 0.05 % external solution Apply 1 application topically daily.   3  . ezetimibe (ZETIA) 10 MG tablet TAKE 1 TABLET BY MOUTH EVERY DAY (Patient taking differently: Take 10 mg by mouth daily. ) 90 tablet 1   . furosemide (LASIX) 20 MG tablet Take 1 tablet (20 mg total) by mouth daily. 90 tablet 2  . HYDROcodone-acetaminophen (NORCO/VICODIN) 5-325 MG tablet Take 0.5-1 tablets by mouth 2 (two) times daily as needed (pain). (Patient taking differently: Take 0.5-1 tablets by mouth 2 (two) times daily as needed (for pain). ) 60 tablet 0  . hydrocortisone 2.5 % cream Apply 1 application topically 2 (two) times daily.    Marland Kitchen ipratropium (ATROVENT) 0.03 % nasal spray PLACE 2 SPRAYS INTO THE NOSE 4 (FOUR) TIMES DAILY. (Patient taking differently: Place 2 sprays into both nostrils 3 (three) times daily. ) 30 mL 1  . levothyroxine (SYNTHROID, LEVOTHROID) 137 MCG tablet Take 1  tablet (137 mcg total) by mouth daily. 30 tablet 6  . losartan (COZAAR) 25 MG tablet Take 1 tablet (25 mg total) by mouth 2 (two) times a day.    . Naphazoline-Pheniramine (OPCON-A) 0.027-0.315 % SOLN Place 1-2 drops into both eyes 3 (three) times daily as needed (for itching, redness, and/or irritation).    . nitroGLYCERIN (NITROLINGUAL) 0.4 MG/SPRAY spray Place 1 spray under the tongue every 5 (five) minutes x 3 doses as needed for chest pain. 12 g 5  . ondansetron (ZOFRAN-ODT) 4 MG disintegrating tablet Take 1 tablet (4 mg total) by mouth every 8 (eight) hours as needed for nausea or vomiting. 20 tablet 0  . polyethylene glycol (MIRALAX / GLYCOLAX) packet Take 17 g by mouth daily as needed for mild constipation. Mix in 8 oz liquid and drink     . Rivaroxaban (XARELTO) 15 MG TABS tablet Take 1 tablet (15 mg total) by mouth daily with supper. 90 tablet 1  . triamcinolone cream (KENALOG) 0.1 % Apply 1 application topically daily as needed (for rashes).      No current facility-administered medications for this visit.     Review of Systems : See HPI for pertinent positives and negatives.  Physical Examination  Vitals:   02/15/19 1046  BP: (!) 152/80  Pulse: 65  Resp: 14  Temp: 97.9 F (36.6 C)  TempSrc: Temporal  SpO2: 98%  Weight:  217 lb (98.4 kg)  Height: 5\' 9"  (1.753 m)   Body mass index is 32.05 kg/m.  General: WDWN obese male in NAD GAIT: slow, steady, using cane Eyes: PERRLA HENT: No gross abnormalities.  Pulmonary:  Respirations are non-labored, good air movement in all fields, distant breath sounds, no rales, rhonchi, or wheezing. Cardiac: regular rhythm, no detected murmur.  VASCULAR EXAM Carotid Bruits Right Left   Negative Negative     Abdominal aortic pulse is not palpable. Radial pulses are 2+ palpable                                                                                                                         LE Pulses Right Left       POPLITEAL  not palpable   not palpable       POSTERIOR TIBIAL  not palpable   faintly palpable        DORSALIS PEDIS      ANTERIOR TIBIAL not palpable  not palpable    Gastrointestinal: soft, nontender, BS WNL, no r/g, no palpable masses. Musculoskeletal: no muscle atrophy/wasting. M/S 5/5 throughout, extremities without ischemic changes. Trace bilateral pretibial pitting edema.  Skin: No rashes, no ulcers, no cellulitis.   Neurologic:  A&O X 3; appropriate affect, sensation is normal; speech is normal, CN 2-12 intact except has significant hearing loss, pain and light touch intact in extremities, motor exam as listed above. Psychiatric: Normal thought content, mood appropriate to clinical situation.    DATA  Carotid Duplex (02-15-19): Right Carotid: Velocities in the right  ICA are consistent with a 60-79%                stenosis. Non-hemodynamically significant plaque <50% noted in                the CCA. The ECA appears <50% stenosed. Calcified plaque may                obscure higher velocities. Left Carotid: Non-hemodynamically significant plaque noted in the CCA. The ECA               appears <50% stenosed. Patent left carotid endarterectomy with               non-hemodynamically significant plaque. Vertebrals:  Left vertebral artery  demonstrates antegrade flow. Right vertebral              artery demonstrates retrograde flow. Subclavians: Left subclavian artery flow was disturbed. Normal flow hemodynamics              were seen in the right subclavian artery. No significant change compared to the exams on 10-28-17 and 04-29-18.   Assessment: [name] is a 85 y.o. male whois status post left carotid endarterectomy in 1998. He had two preoperative TIA's, bothwith amaurosis fugax in the left eye. Pt denies any known subsequent TIA or stroke.  Carotid duplex today: right ICA remains stable at 60-79% stenosis, left CEA site stable with 1-39% stenosis.   His atherosclerotic risk factors include 35 year hx of smoking (quit in 1987), CAD, relatively sedentary lifestyle, and obesity.  He takesXarelto for episode of atrial fib. We discussed ways that he could become more physically active and not be too painful to his knees or shoulders, e.g, stationary bike, daily seated leg exercises.    He had had normal ABI's in September 2017 done at a facility elsewhere.   Plan:  Referral to podiatrist in [place] at his last visit, he has not followed up on this, pt unable to trim his toenails, has decreased pedal pulses. Will again refer.    Follow-up in6 monthswith Carotid Duplex scan.  I discussed in depth with the patient the nature of atherosclerosis, and emphasized the importance of maximal medical management including strict control of blood pressure, blood glucose, and lipid levels, obtaining regular exercise, and continued cessation of smoking.  The patient is aware that without maximal medical management the underlying atherosclerotic disease process will progress, limiting the benefit of any interventions. The patient was given information about stroke prevention and what symptoms should prompt the patient to seek immediate medical care. Thank you for allowing [place] to participate in this patient's  care.  Clemon Chambers, RN, MSN, FNP-C [facility] Office: [phone]  Clinic Physician: Laqueta Due  02/15/19 10:56 AM

## 2019-02-22 ENCOUNTER — Other Ambulatory Visit: Payer: Self-pay | Admitting: Family Medicine

## 2019-02-22 ENCOUNTER — Other Ambulatory Visit: Payer: Self-pay | Admitting: Cardiology

## 2019-02-22 DIAGNOSIS — E039 Hypothyroidism, unspecified: Secondary | ICD-10-CM

## 2019-02-22 NOTE — Progress Notes (Signed)
[name] at [facility] [address], [place],  [address] [phone]  Date:  02/23/2019   Name:  [name]   DOB:  03/19/1934   MRN:  [id number]  PCP:  [name], MD    Chief Complaint: Hospitalization Follow-up (diverticulitis)   History of Present Illness:  [name] is a 85 y.o. very pleasant male patient who presents with the following:  Following up from recent hospital stay for diverticulitis  Accompanied by his wife for contributes to the history today   Admit date: 02/07/2019 Discharge date: 02/09/2019 Recommendations for Outpatient Follow-up:  1. Follow up with PCP 1-2 weeks for evaluation of symptoms. Recommend cmet to evaluate kidney function and electrolytes and LFT's. Consider repeat CT once treatment finished to ensure no underlying mass is present in the sigmoid colon/descending colon junction Discharge Diagnoses:  Principal Problem:   Acute diverticulitis Active Problems:   Essential hypertension   CKD (chronic kidney disease), stage III (HCC)   GASTROESOPHAGEAL REFLUX, NO ESOPHAGITIS   Generalized anxiety disorder   AF (paroxysmal atrial fibrillation) (HCC)   Obesity   Carcinoma in situ of prostate   OSA (obstructive sleep apnea)   Hypothyroidism, acquired   Hyperlipemia Diet recommendation: full liquids to advance slowly as tolerated History of present illness:  [name] a 85 y.o.malewith medical history significant ofdiverticulitis, coronary artery disease, asthma, carotid artery occlusion, GERD, hypertension, hyperlipidemia hypothyroidism, and prostate cancer who  had previous work-up for diverticulitis and has been trying to do well at home but started having another abdominal pain 7/28. Pain  rated as 9 out of 10 in the left lower quadrant. Associated with some nausea but no vomiting. Denied any fever or chills denied any diarrhea denied any melena and no bright red  blood per rectum. Patient was seen and evaluated and found to have acute diverticulitis. He had been unable to manage this at home.  Hospital Course:  #1 acute diverticulitis:CT revealed marked bowel wall thickening with surrounding inflammation at the sigmoid colon/descending colon junction. The findings can be seen in acute diverticulitis. He was provided iv fluids and clear liquids as well as zosyn. On day of discharge tolerating full liquid and pain controlled. Allergic to quinolones. Will discharge with augmentin #2 GERD:Continue PPIs. #3 essential hypertension:Controlled. Home meds include coreg, lasix, losartan. Will hold lasix and losartan until 02/11/19.  #4 hypothyroidism:TSH in march 5.7.Continue levothyroxine. #5 hyperlipidemia:Continue home regimen. #6 morbid obesity:Dietary counseling offered.BMI 35.08 #7 chronic kidney disease stage MCN:[id number] 1.95 at discharge. Chart review indicates baseline 1.5-1.6. he was given IV fluids and nephrotoxins held. Continue to hold lasix and losartan until 02/11/2019. Recommend bmet 1-2 weeks. Urine output good #8 paroxysmal atrial fibrillation:Patient remained in sinus rhythm.Mali score 5. Was previously taking Anda Kraft but stopped. continue home BB  He finished up his abx about one week ago- was improving but then pain seemed to return about 2 days ago He has pain in the LLQ- this is where it was last time as well He has had diverticulitis a couple of times in the more distant past but never this bad No fever No vomiting  He is eating ok, but put himself back on liquids today as he was worried about diverticulitis coming back  He last had a BM 2 days ago He did eat a large meal last night - chicken livers that were cooked "hard and crunchy," he wonders if this exacerbated his illness  Patient Active Problem List   Diagnosis Date Noted  . Acute diverticulitis 02/07/2019  . Angina pectoris, variant ([place]) 05/19/2018  . Poor  compliance with CPAP treatment 04/20/2018  . Excessive daytime sleepiness 03/22/2018  . Poor sleep hygiene 03/22/2018  . Ventral hernia without obstruction or gangrene 10/09/2017  . TIA (transient ischemic attack)   . Kidney stone   . Hypertension   . Hearing aid worn   . Coronary artery disease   . Asthma due to seasonal allergies   . Arthritis   . Erosive gastropathy 03/26/2017  . Acute blood loss anemia 03/25/2017  . Melena 03/24/2017  . CKD (chronic kidney disease), stage III ([place]) 03/24/2017  . AF (paroxysmal atrial fibrillation) ([place]) 03/24/2017  . Angina pectoris ([place]) 10/09/2015  . Essential hypertension   . Hyperlipemia   . Carotid artery occlusion   . Dizziness 08/09/2015  . Gait instability 05/30/2015  . Hypothyroidism, acquired 10/26/2013  . OSA (obstructive sleep apnea) 10/18/2013  . Benign localized hyperplasia of prostate with urinary obstruction and other lower urinary tract symptoms (LUTS)(600.21) 12/21/2012  . Carcinoma in situ of prostate 12/21/2012  . Left shoulder pain 08/08/2012  . Obesity 07/24/2011  . Generalized anxiety disorder 07/24/2011  . DIVERTICULOSIS OF COLON 12/25/2009  . DERMATITIS, SEBORRHEIC 12/25/2009  . 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 Memory loss 08/14/2009  . VENOUS INSUFFICIENCY, CHRONIC 09/09/2006  . GASTROESOPHAGEAL REFLUX, NO ESOPHAGITIS 09/09/2006  . HERNIA, HIATAL, NONCONGENITAL 09/09/2006  . INSOMNIA NOS 09/09/2006    Past Medical History:  Diagnosis Date  . Anginal pain ([place])   . Anxiety    " OCCASIONAL"  . Arthritis   . Asthma due to seasonal allergies    uses inhalers prn  . Carotid artery occlusion    Left  . Complication of anesthesia    " had tremors after prostate surgery  . Coronary artery disease   . Diverticulitis    recurrent  . Family history of anesthesia complication    MOTHER   . GERD (gastroesophageal reflux disease)   . H/O hiatal hernia   . Hearing aid worn   . Hyperlipemia   .  Hypertension   . Kidney stone    lithotripsy 7824 w complications, req stents, Dr Rosana Hoes  . Prostate CA ([place])   . Shortness of breath   . Thyroid disease   . TIA (transient ischemic attack)   . Tinnitus     Past Surgical History:  Procedure Laterality Date  . CARDIAC CATHETERIZATION  2008   minimal dz, Dr Cathie Olden  . CARDIAC CATHETERIZATION N/A 10/09/2015   Procedure: Left Heart Cath and Coronary Angiography;  Surgeon: Peter M Martinique, MD;  Location: [facility];  Service: Cardiovascular;  Laterality: N/A;  . CARDIAC CATHETERIZATION N/A 10/09/2015   Procedure: Intravascular Pressure Wire/FFR Study;  Surgeon: Peter M Martinique, MD;  Location: [facility];  Service: Cardiovascular;  Laterality: N/A;  . CARDIAC CATHETERIZATION N/A 10/09/2015   Procedure: Coronary Stent Intervention;  Surgeon: Peter M Martinique, MD;  Location: [facility];  Service: Cardiovascular;  Laterality: N/A;  . CAROTID ENDARTERECTOMY  Left   1998  . CHOLECYSTECTOMY    . CORONARY STENT PLACEMENT  10/09/2015   DES to RCA  . ESOPHAGOGASTRODUODENOSCOPY (EGD) WITH PROPOFOL Left 03/26/2017   Procedure: ESOPHAGOGASTRODUODENOSCOPY (EGD) WITH PROPOFOL;  Surgeon: Arta Silence, MD;  Location: [facility];  Service: Endoscopy;  Laterality: Left;  . LEFT HEART CATHETERIZATION WITH CORONARY ANGIOGRAM N/A 09/15/2013   Procedure: LEFT  HEART CATHETERIZATION WITH CORONARY ANGIOGRAM;  Surgeon: Peter M Martinique, MD;  Location: [facility];  Service: Cardiovascular;  Laterality: N/A;  . PROSTATECTOMY  1998   radical for prostate cancer    Social History   Tobacco Use  . Smoking status: Former Smoker    Packs/day: 2.50    Years: 30.00    Pack years: 75.00    Types: Cigarettes    Quit date: 08/28/1985    Years since quitting: 33.5  . Smokeless tobacco: Never Used  Substance Use Topics  . Alcohol use: Yes    Alcohol/week: 2.0 standard drinks    Types: 2 Standard drinks or equivalent per week    Comment: 1 glass  wine/week (prior 1 glass martini with dinner)  . Drug use: No    Family History  Problem Relation Age of Onset  . Heart disease Mother   . Heart disease Father   . Kidney failure Father   . Heart disease Sister   . Heart attack Sister   . Dementia Sister   . Sjogren's syndrome Child   . Hashimoto's thyroiditis Child   . CAD Child     Allergies  Allergen Reactions  . Ciprofloxacin Other (See Comments)    Hallucinations or jitteriness  . Codeine Other (See Comments)    Hallucinations   . Flexeril [Cyclobenzaprine] Other (See Comments)    "Made me feel goofy"  . Imdur [Isosorbide Nitrate] Other (See Comments)    Caused headaches  . Methocarbamol Other (See Comments)    "Made me feel goofy"  . Other Other (See Comments)    CANNOT HAVE ANY FOODS WITH SEEDS   . Strawberry Extract Other (See Comments)    CANNOT HAVE ANY FOODS WITH SEEDS  . Sulfa Antibiotics Other (See Comments)    Chills and shaking- "serum sickness"   . Sulfamethoxazole Other (See Comments)    Chills and shaking- "serum sickness"  . Sulfonamide Derivatives Other (See Comments)    Chills and shaking "serum sickness"  . Tomato Other (See Comments)    CANNOT HAVE ANY FOODS WITH SEEDS  . Flagyl [Metronidazole] Rash    Medication list has been reviewed and updated.  Current Outpatient Medications on File Prior to Visit  Medication Sig Dispense Refill  . albuterol (VENTOLIN HFA) 108 (90 Base) MCG/ACT inhaler Inhale 2 puffs into the lungs every 6 (six) hours as needed for wheezing or shortness of breath. 18 g 5  . carvedilol (COREG) 6.25 MG tablet TAKE 1 TABLET BY MOUTH TWICE A DAY WITH A MEAL (Patient taking differently: Take 6.25 mg by mouth See admin instructions. Take 6.25 mg by mouth one to two times a day) 180 tablet 1  . clobetasol (TEMOVATE) 0.05 % external solution Apply 1 application topically daily.   3  . ezetimibe (ZETIA) 10 MG tablet TAKE 1 TABLET BY MOUTH EVERY DAY 90 tablet 0  . furosemide  (LASIX) 20 MG tablet Take 1 tablet (20 mg total) by mouth daily. 90 tablet 2  . hydrocortisone 2.5 % cream Apply 1 application topically 2 (two) times daily.    Marland Kitchen ipratropium (ATROVENT) 0.03 % nasal spray PLACE 2 SPRAYS INTO THE NOSE 4 (FOUR) TIMES DAILY. (Patient taking differently: Place 2 sprays into both nostrils 3 (three) times daily. ) 30 mL 1  . levothyroxine (SYNTHROID, LEVOTHROID) 137 MCG tablet Take 1 tablet (137 mcg total) by mouth daily. 30 tablet 6  . losartan (COZAAR) 25 MG tablet Take 1 tablet (25 mg total) by  mouth 2 (two) times a day.    . Naphazoline-Pheniramine (OPCON-A) 0.027-0.315 % SOLN Place 1-2 drops into both eyes 3 (three) times daily as needed (for itching, redness, and/or irritation).    . nitroGLYCERIN (NITROLINGUAL) 0.4 MG/SPRAY spray Place 1 spray under the tongue every 5 (five) minutes x 3 doses as needed for chest pain. 12 g 5  . ondansetron (ZOFRAN-ODT) 4 MG disintegrating tablet Take 1 tablet (4 mg total) by mouth every 8 (eight) hours as needed for nausea or vomiting. 20 tablet 0  . polyethylene glycol (MIRALAX / GLYCOLAX) packet Take 17 g by mouth daily as needed for mild constipation. Mix in 8 oz liquid and drink     . Rivaroxaban (XARELTO) 15 MG TABS tablet Take 1 tablet (15 mg total) by mouth daily with supper. 90 tablet 1  . triamcinolone cream (KENALOG) 0.1 % Apply 1 application topically daily as needed (for rashes).      No current facility-administered medications on file prior to visit.     Review of Systems:  As per HPI- otherwise negative. No fever noted  Physical Examination: Vitals:   02/23/19 1059  BP: 124/60  Pulse: 68  Resp: 18  Temp: (!) 96.8 F (36 C)  SpO2: 98%   Vitals:   02/23/19 1059  Weight: 220 lb (99.8 kg)  Height: 5\' 9"  (1.753 m)   Body mass index is 32.49 kg/m. Ideal Body Weight: Weight in (lb) to have BMI = 25: 168.9  GEN: WDWN, NAD, Non-toxic, A & O x 3, obese, moving slowly today HEENT: Atraumatic,  Normocephalic. Neck supple. No masses, No LAD. Ears and Nose: No external deformity. CV: RRR, No M/G/R. No JVD. No thrill. No extra heart sounds. PULM: CTA B, no wheezes, crackles, rhonchi. No retractions. No resp. distress. No accessory muscle use. ABD: abd is somewhat distended due to obesity.  He is guarding and tender in the LLQ  EXTR: No c/c/e NEURO slow gait at baseline, uses can.  Today [place] is leaning forward and using his hand to support his left lower quadrant  PSYCH: Normally interactive. Conversant. Not depressed or anxious appearing.  Calm demeanor.    Assessment and Plan:   ICD-10-CM   1. Hospital discharge follow-up  Z09   2. Essential hypertension  I10   3. CKD (chronic kidney disease) stage 3, GFR 30-59 ml/min (HCC)  N18.3   4. Memory loss  R41.3   5. Paroxysmal atrial fibrillation (HCC)  I48.0      Follow-up: No follow-ups on file.  No orders of the defined types were placed in this encounter.  No orders of the defined types were placed in this encounter.   @SIGN @  Following up from recent hospital stay today- however unfortunately his diverticulitis sx are back  He needs labs and likely a CT scan Pt and his wife escorted to the ER here at the [facility] by staff Appreciate ER care of this nice patient   Signed Lamar Blinks, MD

## 2019-02-23 ENCOUNTER — Ambulatory Visit (INDEPENDENT_AMBULATORY_CARE_PROVIDER_SITE_OTHER): Payer: Medicare Other | Admitting: Family Medicine

## 2019-02-23 ENCOUNTER — Emergency Department (HOSPITAL_BASED_OUTPATIENT_CLINIC_OR_DEPARTMENT_OTHER)
Admission: EM | Admit: 2019-02-23 | Discharge: 2019-02-23 | Disposition: A | Discharge status: Home / Self Care | Payer: Medicare Other | Attending: Emergency Medicine | Admitting: Emergency Medicine

## 2019-02-23 ENCOUNTER — Encounter: Payer: Self-pay | Admitting: Family Medicine

## 2019-02-23 ENCOUNTER — Other Ambulatory Visit: Payer: Self-pay

## 2019-02-23 ENCOUNTER — Emergency Department (HOSPITAL_BASED_OUTPATIENT_CLINIC_OR_DEPARTMENT_OTHER): Payer: Medicare Other

## 2019-02-23 VITALS — BP 124/60 | HR 68 | Temp 96.8°F | Resp 18 | Ht 69.0 in | Wt 220.0 lb

## 2019-02-23 DIAGNOSIS — J45909 Unspecified asthma, uncomplicated: Secondary | ICD-10-CM | POA: Insufficient documentation

## 2019-02-23 DIAGNOSIS — E039 Hypothyroidism, unspecified: Secondary | ICD-10-CM | POA: Diagnosis not present

## 2019-02-23 DIAGNOSIS — Z09 Encounter for follow-up examination after completed treatment for conditions other than malignant neoplasm: Secondary | ICD-10-CM | POA: Diagnosis not present

## 2019-02-23 DIAGNOSIS — I480 Paroxysmal atrial fibrillation: Secondary | ICD-10-CM | POA: Diagnosis not present

## 2019-02-23 DIAGNOSIS — R1934 Left lower quadrant abdominal rigidity: Secondary | ICD-10-CM | POA: Diagnosis present

## 2019-02-23 DIAGNOSIS — I129 Hypertensive chronic kidney disease with stage 1 through stage 4 chronic kidney disease, or unspecified chronic kidney disease: Secondary | ICD-10-CM | POA: Insufficient documentation

## 2019-02-23 DIAGNOSIS — R413 Other amnesia: Secondary | ICD-10-CM

## 2019-02-23 DIAGNOSIS — K5792 Diverticulitis of intestine, part unspecified, without perforation or abscess without bleeding: Secondary | ICD-10-CM | POA: Insufficient documentation

## 2019-02-23 DIAGNOSIS — Z87891 Personal history of nicotine dependence: Secondary | ICD-10-CM | POA: Diagnosis not present

## 2019-02-23 DIAGNOSIS — I10 Essential (primary) hypertension: Secondary | ICD-10-CM

## 2019-02-23 DIAGNOSIS — N183 Chronic kidney disease, stage 3 unspecified: Secondary | ICD-10-CM

## 2019-02-23 DIAGNOSIS — N189 Chronic kidney disease, unspecified: Secondary | ICD-10-CM | POA: Diagnosis not present

## 2019-02-23 DIAGNOSIS — I25118 Atherosclerotic heart disease of native coronary artery with other forms of angina pectoris: Secondary | ICD-10-CM | POA: Insufficient documentation

## 2019-02-23 DIAGNOSIS — Z79899 Other long term (current) drug therapy: Secondary | ICD-10-CM | POA: Insufficient documentation

## 2019-02-23 LAB — CBC WITH DIFFERENTIAL/PLATELET
Abs Immature Granulocytes: 0.33 10*3/uL — ABNORMAL HIGH (ref 0.00–0.07)
Basophils Absolute: 0.1 10*3/uL (ref 0.0–0.1)
Basophils Relative: 2 %
Eosinophils Absolute: 0.5 10*3/uL (ref 0.0–0.5)
Eosinophils Relative: 10 %
HCT: 41.6 % (ref 39.0–52.0)
Hemoglobin: 14 g/dL (ref 13.0–17.0)
Immature Granulocytes: 6 %
Lymphocytes Relative: 24 %
Lymphs Abs: 1.3 10*3/uL (ref 0.7–4.0)
MCH: 34.9 pg — ABNORMAL HIGH (ref 26.0–34.0)
MCHC: 33.7 g/dL (ref 30.0–36.0)
MCV: 103.7 fL — ABNORMAL HIGH (ref 80.0–100.0)
Monocytes Absolute: 0.7 10*3/uL (ref 0.1–1.0)
Monocytes Relative: 14 %
Neutro Abs: 2.3 10*3/uL (ref 1.7–7.7)
Neutrophils Relative %: 44 %
Platelets: 158 10*3/uL (ref 150–400)
RBC: 4.01 MIL/uL — ABNORMAL LOW (ref 4.22–5.81)
RDW: 13.6 % (ref 11.5–15.5)
WBC: 5.1 10*3/uL (ref 4.0–10.5)
nRBC: 0 % (ref 0.0–0.2)

## 2019-02-23 LAB — COMPREHENSIVE METABOLIC PANEL
ALT: 38 U/L (ref 0–44)
AST: 35 U/L (ref 15–41)
Albumin: 3.9 g/dL (ref 3.5–5.0)
Alkaline Phosphatase: 56 U/L (ref 38–126)
Anion gap: 10 (ref 5–15)
BUN: 29 mg/dL — ABNORMAL HIGH (ref 8–23)
CO2: 24 mmol/L (ref 22–32)
Calcium: 9.4 mg/dL (ref 8.9–10.3)
Chloride: 104 mmol/L (ref 98–111)
Creatinine, Ser: 1.85 mg/dL — ABNORMAL HIGH (ref 0.61–1.24)
GFR calc Af Amer: 38 mL/min — ABNORMAL LOW (ref >60)
GFR calc non Af Amer: 33 mL/min — ABNORMAL LOW (ref >60)
Glucose, Bld: 117 mg/dL — ABNORMAL HIGH (ref 70–99)
Potassium: 5.2 mmol/L — ABNORMAL HIGH (ref 3.5–5.1)
Sodium: 138 mmol/L (ref 135–145)
Total Bilirubin: 1.9 mg/dL — ABNORMAL HIGH (ref 0.3–1.2)
Total Protein: 7.2 g/dL (ref 6.5–8.1)

## 2019-02-23 LAB — LIPASE, BLOOD: Lipase: 34 U/L (ref 11–51)

## 2019-02-23 LAB — URINALYSIS, ROUTINE W REFLEX MICROSCOPIC
Bilirubin Urine: NEGATIVE
Glucose, UA: NEGATIVE mg/dL
Ketones, ur: NEGATIVE mg/dL
Leukocytes,Ua: NEGATIVE
Nitrite: NEGATIVE
Protein, ur: NEGATIVE mg/dL
Specific Gravity, Urine: 1.025 (ref 1.005–1.030)
pH: 5.5 (ref 5.0–8.0)

## 2019-02-23 LAB — URINALYSIS, MICROSCOPIC (REFLEX): Squamous Epithelial / LPF: NONE SEEN (ref 0–5)

## 2019-02-23 LAB — LACTIC ACID, PLASMA: Lactic Acid, Venous: 1.6 mmol/L (ref 0.5–1.9)

## 2019-02-23 MED ORDER — ONDANSETRON HCL 4 MG/2ML IJ SOLN
4.0000 mg | Freq: Once | INTRAMUSCULAR | Status: AC
  Administered 2019-02-23: 15:00:00 4 mg via INTRAVENOUS
  Filled 2019-02-23: qty 2

## 2019-02-23 MED ORDER — AMOXICILLIN-POT CLAVULANATE 875-125 MG PO TABS: 1.0000 | ORAL_TABLET | Freq: Two times a day (BID) | ORAL | 0 refills | Status: DC

## 2019-02-23 MED ORDER — FENTANYL CITRATE (PF) 100 MCG/2ML IJ SOLN
50.0000 ug | Freq: Once | INTRAMUSCULAR | Status: AC
  Administered 2019-02-23: 50 ug via INTRAVENOUS
  Filled 2019-02-23: qty 2

## 2019-02-23 NOTE — ED Notes (Signed)
Pt verbalized understanding of dc instructions.

## 2019-02-23 NOTE — ED Provider Notes (Signed)
F/u CT for suspected diverticulitis. Physical Exam  BP (!) 144/64 (BP Location: Right Arm)   Pulse (!) 53   Temp 99.2 F (37.3 C) (Oral)   Resp 16   SpO2 99%   Physical Exam  ED Course/Procedures     Procedures  MDM  Patient is comfortable and nontoxic.  CT does not show any perforation or abscess formation.  Patient does have continued inflammatory changes consistent with diverticulitis.  Will extend his Augmentin course for 7 days and advised for close follow-up with Dr. Cristina Gong.  Return precautions reviewed.  Patient has Vicodin at home to take on as needed basis if needed.       Charlesetta Shanks, MD 02/23/19 [phone]

## 2019-02-23 NOTE — ED Provider Notes (Signed)
[facility] EMERGENCY DEPARTMENT Provider Note   CSN: [id number] Arrival date & time: 02/23/19  1127     History   Chief Complaint Chief Complaint  Patient presents with   Abdominal Pain    HPI [name] is a 85 y.o. male.     Patient is an 85 year old male with recurrent diverticulitis who was recently hospitalized and discharged 2 weeks ago with an acute flare, chronic kidney disease, paroxysmal atrial fibrillation not currently on anticoagulation, Hypertension, coronary artery disease who presents today for worsening left lower quadrant pain.  Patient states since being discharged 13 days ago he finished his course of Augmentin approximately 3 to 4 days ago in the pain had become very minimal.  In the last 2 days the pain has started worsening again.  He saw his PCP today who sent him down for further care.  Patient denies any nausea or vomiting but states now with any type of movement or cough the pain is worse and even wakes him up at night.  He is having a bowel movement every other day and it is formed and nonbloody.  He has had decreased appetite but is still eating and drinking some.  Pain is currently a 6 out of 10 but becomes a 9 out of 10 with movement.  Denies any problems with urination  The history is provided by the patient and the spouse.  Abdominal Pain Pain location:  LLQ   Past Medical History:  Diagnosis Date   Anginal pain ([place])    Anxiety    " OCCASIONAL"   Arthritis    Asthma due to seasonal allergies    uses inhalers prn   Carotid artery occlusion    Left   Complication of anesthesia    " had tremors after prostate surgery   Coronary artery disease    Diverticulitis    recurrent   Family history of anesthesia complication    MOTHER    GERD (gastroesophageal reflux disease)    H/O hiatal hernia    Hearing aid worn    Hyperlipemia    Hypertension    Kidney stone    lithotripsy 1941 w complications, req stents, Dr  Rosana Hoes   Prostate CA [facility])    Shortness of breath    Thyroid disease    TIA (transient ischemic attack)    Tinnitus     Patient Active Problem List   Diagnosis Date Noted   Acute diverticulitis 02/07/2019   Angina pectoris, variant ([place]) 05/19/2018   Poor compliance with CPAP treatment 04/20/2018   Excessive daytime sleepiness 03/22/2018   Poor sleep hygiene 03/22/2018   Ventral hernia without obstruction or gangrene 10/09/2017   TIA (transient ischemic attack)    Kidney stone    Hypertension    Hearing aid worn    Coronary artery disease    Asthma due to seasonal allergies    Arthritis    Erosive gastropathy 03/26/2017   Acute blood loss anemia 03/25/2017   Melena 03/24/2017   CKD (chronic kidney disease), stage III ([place]) 03/24/2017   AF (paroxysmal atrial fibrillation) ([place]) 03/24/2017   Angina pectoris ([place]) 10/09/2015   Essential hypertension    Hyperlipemia    Carotid artery occlusion    Dizziness 08/09/2015   Gait instability 05/30/2015   Hypothyroidism, acquired 10/26/2013   OSA (obstructive sleep apnea) 10/18/2013   Benign localized hyperplasia of prostate with urinary obstruction and other lower urinary tract symptoms (LUTS)(600.21) 12/21/2012   Carcinoma in situ of  prostate 12/21/2012   Left shoulder pain 08/08/2012   Obesity 07/24/2011   Generalized anxiety disorder 07/24/2011   DIVERTICULOSIS OF COLON 12/25/2009   DERMATITIS, SEBORRHEIC 12/25/2009   Noise-induced hearing loss 12/18/2009   Memory loss 08/14/2009   VENOUS INSUFFICIENCY, CHRONIC 09/09/2006   GASTROESOPHAGEAL REFLUX, NO ESOPHAGITIS 09/09/2006   HERNIA, HIATAL, NONCONGENITAL 09/09/2006   INSOMNIA NOS 09/09/2006    Past Surgical History:  Procedure Laterality Date   CARDIAC CATHETERIZATION  2008   minimal dz, Dr Cathie Olden   CARDIAC CATHETERIZATION N/A 10/09/2015   Procedure: Left Heart Cath and Coronary Angiography;  Surgeon: Peter M Martinique, MD;   Location: [facility] CV LAB;  Service: Cardiovascular;  Laterality: N/A;   CARDIAC CATHETERIZATION N/A 10/09/2015   Procedure: Intravascular Pressure Wire/FFR Study;  Surgeon: Peter M Martinique, MD;  Location: [facility] CV LAB;  Service: Cardiovascular;  Laterality: N/A;   CARDIAC CATHETERIZATION N/A 10/09/2015   Procedure: Coronary Stent Intervention;  Surgeon: Peter M Martinique, MD;  Location: [place] CV LAB;  Service: Cardiovascular;  Laterality: N/A;   CAROTID ENDARTERECTOMY  Left   1998   CHOLECYSTECTOMY     CORONARY STENT PLACEMENT  10/09/2015   DES to RCA   ESOPHAGOGASTRODUODENOSCOPY (EGD) WITH PROPOFOL Left 03/26/2017   Procedure: ESOPHAGOGASTRODUODENOSCOPY (EGD) WITH PROPOFOL;  Surgeon: Arta Silence, MD;  Location: [facility] ENDOSCOPY;  Service: Endoscopy;  Laterality: Left;   LEFT HEART CATHETERIZATION WITH CORONARY ANGIOGRAM N/A 09/15/2013   Procedure: LEFT HEART CATHETERIZATION WITH CORONARY ANGIOGRAM;  Surgeon: Peter M Martinique, MD;  Location: [facility] CATH LAB;  Service: Cardiovascular;  Laterality: N/A;   PROSTATECTOMY  1998   radical for prostate cancer        Home Medications    Prior to Admission medications   Medication Sig Start Date End Date Taking? Authorizing Provider  albuterol (VENTOLIN HFA) 108 (90 Base) MCG/ACT inhaler Inhale 2 puffs into the lungs every 6 (six) hours as needed for wheezing or shortness of breath. 08/18/17   Copland, Gay Filler, MD  carvedilol (COREG) 6.25 MG tablet TAKE 1 TABLET BY MOUTH TWICE A DAY WITH A MEAL Patient taking differently: Take 6.25 mg by mouth See admin instructions. Take 6.25 mg by mouth one to two times a day 01/31/18   Copland, Gay Filler, MD  clobetasol (TEMOVATE) 0.05 % external solution Apply 1 application topically daily.  10/06/17   [provider]  ezetimibe (ZETIA) 10 MG tablet TAKE 1 TABLET BY MOUTH EVERY DAY 02/22/19   Martinique, Peter M, MD  furosemide (LASIX) 20 MG tablet Take 1 tablet (20 mg total) by mouth daily.  02/11/19   Black, Lezlie Octave, NP  hydrocortisone 2.5 % cream Apply 1 application topically 2 (two) times daily.    [provider]  ipratropium (ATROVENT) 0.03 % nasal spray PLACE 2 SPRAYS INTO THE NOSE 4 (FOUR) TIMES DAILY. Patient taking differently: Place 2 sprays into both nostrils 3 (three) times daily.  05/06/18   Copland, Gay Filler, MD  levothyroxine (SYNTHROID, LEVOTHROID) 137 MCG tablet Take 1 tablet (137 mcg total) by mouth daily. 09/14/18   Copland, Gay Filler, MD  losartan (COZAAR) 25 MG tablet Take 1 tablet (25 mg total) by mouth 2 (two) times a day. 02/11/19   Black, Lezlie Octave, NP  Naphazoline-Pheniramine (OPCON-A) 0.027-0.315 % SOLN Place 1-2 drops into both eyes 3 (three) times daily as needed (for itching, redness, and/or irritation).    [provider]  nitroGLYCERIN (NITROLINGUAL) 0.4 MG/SPRAY spray Place 1 spray under the tongue  every 5 (five) minutes x 3 doses as needed for chest pain. 07/18/18   Martinique, Peter M, MD  ondansetron (ZOFRAN-ODT) 4 MG disintegrating tablet Take 1 tablet (4 mg total) by mouth every 8 (eight) hours as needed for nausea or vomiting. 12/26/18   Wendling, Crosby Oyster, DO  polyethylene glycol (MIRALAX / GLYCOLAX) packet Take 17 g by mouth daily as needed for mild constipation. Mix in 8 oz liquid and drink     [provider]  Rivaroxaban (XARELTO) 15 MG TABS tablet Take 1 tablet (15 mg total) by mouth daily with supper. 10/17/18   Martinique, Peter M, MD  triamcinolone cream (KENALOG) 0.1 % Apply 1 application topically daily as needed (for rashes).     [provider]    Family History Family History  Problem Relation Age of Onset   Heart disease Mother    Heart disease Father    Kidney failure Father    Heart disease Sister    Heart attack Sister    Dementia Sister    Sjogren's syndrome Child    Hashimoto's thyroiditis Child    CAD Child     Social History Social History   Tobacco Use   Smoking status: Former  Smoker    Packs/day: 2.50    Years: 30.00    Pack years: 75.00    Types: Cigarettes    Quit date: 08/28/1985    Years since quitting: 33.5   Smokeless tobacco: Never Used  Substance Use Topics   Alcohol use: Yes    Alcohol/week: 2.0 standard drinks    Types: 2 Standard drinks or equivalent per week    Comment: 1 glass wine/week (prior 1 glass martini with dinner)   Drug use: No     Allergies   Ciprofloxacin, Codeine, Flexeril [cyclobenzaprine], Imdur [isosorbide nitrate], Methocarbamol, Other, Strawberry extract, Sulfa antibiotics, Sulfamethoxazole, Sulfonamide derivatives, Tomato, and Flagyl [metronidazole]   Review of Systems Review of Systems  Gastrointestinal: Positive for abdominal pain.  All other systems reviewed and are negative.    Physical Exam Updated Vital Signs BP 112/68 (BP Location: Right Arm)    Pulse (!) 52    Temp 99.2 F (37.3 C) (Oral)    Resp 18    SpO2 98%   Physical Exam Vitals signs and nursing note reviewed.  Constitutional:      General: He is not in acute distress.    Appearance: He is well-developed. He is obese.  HENT:     Head: Normocephalic and atraumatic.  Eyes:     Conjunctiva/sclera: Conjunctivae normal.     Pupils: Pupils are equal, round, and reactive to light.  Neck:     Musculoskeletal: Normal range of motion and neck supple.  Cardiovascular:     Rate and Rhythm: Normal rate and regular rhythm.     Heart sounds: No murmur.  Pulmonary:     Effort: Pulmonary effort is normal. No respiratory distress.     Breath sounds: Normal breath sounds. No wheezing or rales.  Abdominal:     General: There is no distension.     Palpations: Abdomen is soft.     Tenderness: There is abdominal tenderness in the left lower quadrant. There is guarding and rebound. There is no right CVA tenderness or left CVA tenderness.     Hernia: No hernia is present.  Musculoskeletal: Normal range of motion.        General: No tenderness.  Skin:     General: Skin is warm and dry.  Findings: No erythema or rash.  Neurological:     Mental Status: He is alert and oriented to person, place, and time.  Psychiatric:        Behavior: Behavior normal.      ED Treatments / Results  Labs (all labs ordered are listed, but only abnormal results are displayed) Labs Reviewed  CBC WITH DIFFERENTIAL/PLATELET - Abnormal; Notable for the following components:      Result Value   RBC 4.01 (*)    MCV 103.7 (*)    MCH 34.9 (*)    All other components within normal limits  COMPREHENSIVE METABOLIC PANEL  LIPASE, BLOOD  URINALYSIS, ROUTINE W REFLEX MICROSCOPIC  LACTIC ACID, PLASMA    EKG None  Radiology No results found.  Procedures Procedures (including critical care time)  Medications Ordered in ED Medications - No data to display   Initial Impression / Assessment and Plan / ED Course  I have reviewed the triage vital signs and the nursing notes.  Pertinent labs & imaging results that were available during my care of the patient were reviewed by me and considered in my medical decision making (see chart for details).        Elderly male with multiple medical problems presenting today for worsening left lower quadrant pain in the setting of recent active diverticulitis.  Patient finished Augmentin 3 to 4 days ago and now pain is worsened over the last 2 days.  He denies any fever nausea or vomiting but has rebound and guarding on exam.  Concern for recurrent diverticulitis versus complication such as abscess or perforation.  Patient is currently hemodynamically stable.  He is refusing anything for pain at this time.  Labs and CT pending  Final Clinical Impressions(s) / ED Diagnoses   Final diagnoses:  None    ED Discharge Orders    None       Blanchie Dessert, MD 02/24/19 1411

## 2019-02-23 NOTE — ED Notes (Signed)
Patient transported to CT 

## 2019-02-23 NOTE — ED Triage Notes (Signed)
Pt c/o LLQ pain-recent hosp admn for diverticulitis-sent from PCP office in the building-to triage in w/c-NAD-wife with pt

## 2019-02-23 NOTE — ED Notes (Signed)
Pt c/o L sided chest pain upon returning from CT. Requesting pain meds as well as abd pain is worse. EDP made aware

## 2019-02-23 NOTE — ED Notes (Signed)
Pt ambulated to RR unassisted. Warm blanket given

## 2019-02-24 LAB — PATHOLOGIST SMEAR REVIEW

## 2019-03-03 ENCOUNTER — Other Ambulatory Visit: Payer: Self-pay | Admitting: Family Medicine

## 2019-03-03 DIAGNOSIS — J300 Vasomotor rhinitis: Secondary | ICD-10-CM

## 2019-03-03 MED ORDER — IPRATROPIUM BROMIDE 0.03 % NA SOLN: 2.0000 | Freq: Four times a day (QID) | NASAL | 5 refills | Status: DC

## 2019-03-03 NOTE — Telephone Encounter (Signed)
Medication Refill - Medication: ipratropium (ATROVENT) 0.03 % nasal spray    Has the patient contacted their pharmacy? Yes.    (request that the patient contact the pharmacy for the refill.)  (Agent: If yes, when and what did the pharmacy advise?) Pharmacy sent request to Dr. Lorelei Pont a week ago with no response.   Preferred Pharmacy (with phone number or street name):  [id number] - [place], [place] - [address] [phone] (Phone) [phone] (Fax)     Agent: Please be advised that RX refills may take up to 3 business days. We ask that you follow-up with your pharmacy.

## 2019-03-28 ENCOUNTER — Telehealth: Payer: Self-pay | Admitting: Cardiology

## 2019-03-28 NOTE — Telephone Encounter (Signed)
Patient wants to know what was his original dosage that he started of is BP medication of carvedilol (COREG) 6.25 MG tablet. He has some questions about it and wants some clarification.

## 2019-03-28 NOTE — Telephone Encounter (Signed)
Called and LVM for patient to call back to discuss questions on medications. Left call back number.

## 2019-03-30 NOTE — Telephone Encounter (Signed)
Lm to call back ./cy 

## 2019-03-31 NOTE — Telephone Encounter (Signed)
Spoke with patient who is asking the dose for carvedilol. Advised that he should be taking 6.25 mg twice daily. Patient confirmed that his bottle is for 6.25 mg twice daily. Advised that he was past due for follow up. Appointment scheduled for 05/30/2019 @2 :00 pm with Dr. Martinique. Patient aware of appointment.

## 2019-04-01 ENCOUNTER — Other Ambulatory Visit: Payer: Self-pay | Admitting: Cardiology

## 2019-04-06 ENCOUNTER — Other Ambulatory Visit: Payer: Self-pay | Admitting: Physician Assistant

## 2019-04-09 ENCOUNTER — Other Ambulatory Visit: Payer: Self-pay | Admitting: Family Medicine

## 2019-05-22 ENCOUNTER — Ambulatory Visit: Payer: Self-pay | Admitting: *Deleted

## 2019-05-22 NOTE — Telephone Encounter (Signed)
Woke up this morning vibrating in body, sweating, chills- not new but today was one of the strongest episodes he has had. Patient states his symptoms have been getting worse over time and he is feeling a chest vibration that normally will go away- but is not getting better today. Advised ED for symptoms- patient wants to wait. He did attempted to call cardiology- but states 25 calls were before his.  Reason for Disposition . Patient sounds very sick or weak to the triager  Answer Assessment - Initial Assessment Questions 1. DESCRIPTION: "Please describe your heart rate or heart beat that you are having" (e.g., fast/slow, regular/irregular, skipped or extra beats, "palpitations")     Patient is vibrating now 2. ONSET: "When did it start?" (Minutes, hours or days)      Started this morning- when patient woke up- 8 am 3. DURATION: "How long does it last" (e.g., seconds, minutes, hours)     Still feels vibrating 4. PATTERN "Does it come and go, or has it been constant since it started?"  "Does it get worse with exertion?"   "Are you feeling it now?"     This past week he has awoke with afib- this episode not subsiding as quickly 5. TAP: "Using your hand, can you tap out what you are feeling on a chair or table in front of you, so that I can hear?" (Note: not all patients can do this)       Not feeling the Afib having symptoms 6. HEART RATE: "Can you tell me your heart rate?" "How many beats in 15 seconds?"  (Note: not all patients can do this)       Heart rate usually-50-80- P 62 BP 128/75 7. RECURRENT SYMPTOM: "Have you ever had this before?" If so, ask: "When was the last time?" and "What happened that time?"      History of Afib- never been this bad 8. CAUSE: "What do you think is causing the palpitations?"     Afib 9. CARDIAC HISTORY: "Do you have any history of heart disease?" (e.g., heart attack, angina, bypass surgery, angioplasty, arrhythmia)      Heart disease 10. OTHER SYMPTOMS: "Do  you have any other symptoms?" (e.g., dizziness, chest pain, sweating, difficulty breathing)       Slight chest discomfort, chills 11. PREGNANCY: "Is there any chance you are pregnant?" "When was your last menstrual period?"       n/a  Protocols used: HEART RATE AND HEARTBEAT QUESTIONS-A-AH

## 2019-05-22 NOTE — Telephone Encounter (Signed)
FYI

## 2019-05-28 NOTE — Progress Notes (Signed)
Cardiology Office Note    Date:  05/30/2019   ID:  DRE GAMINO, DOB 25-Jan-1934, MRN [id number]  PCP:  [name], MD  Cardiologist:  Dr. Martinique  Chief Complaint  Patient presents with  . Palpitations    History of Present Illness:  [name] is a 85 y.o. male with PMH of CAD, prostate CA s/p radical prostatectomy 1998, HTN, HLD, h/o carotid artery disease s/p L CEA 1998, PAF, and TIA.   He was admitted to the hospital in 2015 for atypical chest pain, he was ruled out for MI. Cardiac catheterization demonstrated 70% stenosis in proximal RCA with FFR of 0.82, it was felt this lesion was not flow-limiting therefore he was treated medically. In March 2017, he presented with class III angina despite good medical therapy, repeat cardiac catheterization demonstrated a 70% proximal RCA with FFR of 0.76, this lesion was stented with drug-eluting stent. He did develop generalized weakness in September 2016 with arthralgias, this was attributed to statin which was stopped. Now he is on Zetia. He is also on thyroid replacement therapy for hypothyroidism.   He is followed by Dr. Scot Dock for Carotid artery disease and underwent left CEA. Evaluation in December 2016 showed 70-80% right ICA stenosis. He developed cellulitis of the right lower extremity in November 2017.     Patient was seen in the ED in September 2018 with atrial fibrillation and she was started on Xarelto at the time.  He returned in mid September 2018 with melena, Xarelto was held.  He was started on IV PPI.  Endoscopy revealed erosive gastritis. He was treated with Protonix.   Patient was seen on 05/11/2018 at which time he complained of exertional chest pain.  He was started on a trial of medical therapy was Imdur 30 mg daily.  He was felt to be mildly volume overloaded.  His losartan-HCTZ was stopped and he was started on losartan and Lasix 20 mg daily.  Echocardiogram obtained on 05/20/2018 showed EF 60 to 65%, grade 1 DD,  mild aortic stenosis, mild MR, RVSP 42 mmHg.  Subsequent follow up noted he was stable.    He never started on the Imdur due to history of side effect associated with headache.    Today he notes his metabolism has slowed down. Gaining weight despite eating less. Eats fairly healthy. He is quite inactive. Unable to ride his bike due to balance issues. He does awake with some palpitations almost every morning. Fleeting right sided chest pain at times. No dyspnea. He is hard of hearing.    Past Medical History:  Diagnosis Date  . Anginal pain ([name])   . Anxiety    " OCCASIONAL"  . Arthritis   . Asthma due to seasonal allergies    uses inhalers prn  . Carotid artery occlusion    Left  . Complication of anesthesia    " had tremors after prostate surgery  . Coronary artery disease   . Diverticulitis    recurrent  . Family history of anesthesia complication    MOTHER   . GERD (gastroesophageal reflux disease)   . H/O hiatal hernia   . Hearing aid worn   . Hyperlipemia   . Hypertension   . Kidney stone    lithotripsy 9379 w complications, req stents, Dr Rosana Hoes  . Prostate CA ([name])   . Shortness of breath   . Thyroid disease   . TIA (transient ischemic attack)   . Tinnitus  Past Surgical History:  Procedure Laterality Date  . CARDIAC CATHETERIZATION  2008   minimal dz, Dr Cathie Olden  . CARDIAC CATHETERIZATION N/A 10/09/2015   Procedure: Left Heart Cath and Coronary Angiography;  Surgeon: Ladarian Bonczek M Martinique, MD;  Location: [facility];  Service: Cardiovascular;  Laterality: N/A;  . CARDIAC CATHETERIZATION N/A 10/09/2015   Procedure: Intravascular Pressure Wire/FFR Study;  Surgeon: Ousmane Seeman M Martinique, MD;  Location: [facility];  Service: Cardiovascular;  Laterality: N/A;  . CARDIAC CATHETERIZATION N/A 10/09/2015   Procedure: Coronary Stent Intervention;  Surgeon: Kainalu Heggs M Martinique, MD;  Location: [facility];  Service: Cardiovascular;  Laterality: N/A;  . CAROTID  ENDARTERECTOMY  Left   1998  . CHOLECYSTECTOMY    . CORONARY STENT PLACEMENT  10/09/2015   DES to RCA  . ESOPHAGOGASTRODUODENOSCOPY (EGD) WITH PROPOFOL Left 03/26/2017   Procedure: ESOPHAGOGASTRODUODENOSCOPY (EGD) WITH PROPOFOL;  Surgeon: Arta Silence, MD;  Location: [facility];  Service: Endoscopy;  Laterality: Left;  . LEFT HEART CATHETERIZATION WITH CORONARY ANGIOGRAM N/A 09/15/2013   Procedure: LEFT HEART CATHETERIZATION WITH CORONARY ANGIOGRAM;  Surgeon: English Craighead M Martinique, MD;  Location: [facility];  Service: Cardiovascular;  Laterality: N/A;  . PROSTATECTOMY  1998   radical for prostate cancer    Current Medications: Outpatient Medications Prior to Visit  Medication Sig Dispense Refill  . albuterol (VENTOLIN HFA) 108 (90 Base) MCG/ACT inhaler Inhale 2 puffs into the lungs every 6 (six) hours as needed for wheezing or shortness of breath. 18 g 5  . amoxicillin-clavulanate (AUGMENTIN) 875-125 MG tablet Take 1 tablet by mouth 2 (two) times daily. One po bid x 7 days 14 tablet 0  . carvedilol (COREG) 6.25 MG tablet Take 1 tablet (6.25 mg total) by mouth See admin instructions. Take 6.25 mg by mouth one to two times a day 180 tablet 1  . clobetasol (TEMOVATE) 0.05 % external solution Apply 1 application topically daily.   3  . ezetimibe (ZETIA) 10 MG tablet TAKE 1 TABLET BY MOUTH EVERY DAY 90 tablet 0  . furosemide (LASIX) 20 MG tablet Take 1 tablet (20 mg total) by mouth daily. 90 tablet 2  . hydrocortisone 2.5 % cream Apply 1 application topically 2 (two) times daily.    Marland Kitchen ipratropium (ATROVENT) 0.03 % nasal spray Place 2 sprays into the nose 4 (four) times daily. 30 mL 5  . levothyroxine (SYNTHROID) 137 MCG tablet TAKE 1 TABLET BY MOUTH EVERY DAY 90 tablet 2  . losartan (COZAAR) 50 MG tablet Take 1 tablet (50 mg total) by mouth daily. 90 tablet 0  . Naphazoline-Pheniramine (OPCON-A) 0.027-0.315 % SOLN Place 1-2 drops into both eyes 3 (three) times daily as needed (for itching, redness,  and/or irritation).    . nitroGLYCERIN (NITROLINGUAL) 0.4 MG/SPRAY spray Place 1 spray under the tongue every 5 (five) minutes x 3 doses as needed for chest pain. 12 g 5  . ondansetron (ZOFRAN-ODT) 4 MG disintegrating tablet Take 1 tablet (4 mg total) by mouth every 8 (eight) hours as needed for nausea or vomiting. 20 tablet 0  . polyethylene glycol (MIRALAX / GLYCOLAX) packet Take 17 g by mouth daily as needed for mild constipation. Mix in 8 oz liquid and drink     . triamcinolone cream (KENALOG) 0.1 % Apply 1 application topically daily as needed (for rashes).     Alveda Reasons 15 MG TABS tablet TAKE 1 TABLET (15 MG TOTAL) BY MOUTH DAILY WITH SUPPER. 90 tablet 1  No facility-administered medications prior to visit.      Allergies:   Ciprofloxacin, Codeine, Flexeril [cyclobenzaprine], Imdur [isosorbide nitrate], Methocarbamol, Other, Strawberry extract, Sulfa antibiotics, Sulfamethoxazole, Sulfonamide derivatives, Tomato, and Flagyl [metronidazole]   Social History   Socioeconomic History  . Marital status: Married    Spouse name: Not on file  . Number of children: Not on file  . Years of education: Not on file  . Highest education level: Not on file  Occupational History  . Occupation: retired    Comment: Actor [employer]  Social Needs  . Financial resource strain: Not on file  . Food insecurity    Worry: Not on file    Inability: Not on file  . Transportation needs    Medical: Not on file    Non-medical: Not on file  Tobacco Use  . Smoking status: Former Smoker    Packs/day: 2.50    Years: 30.00    Pack years: 75.00    Types: Cigarettes    Quit date: 08/28/1985    Years since quitting: 33.7  . Smokeless tobacco: Never Used  Substance and Sexual Activity  . Alcohol use: Yes    Alcohol/week: 2.0 standard drinks    Types: 2 Standard drinks or equivalent per week    Comment: 1 glass wine/week (prior 1 glass martini with dinner)  . Drug use: No  . Sexual  activity: Yes    Birth control/protection: None  Lifestyle  . Physical activity    Days per week: Not on file    Minutes per session: Not on file  . Stress: Not on file  Relationships  . Social Herbalist on phone: Not on file    Gets together: Not on file    Attends religious service: Not on file    Active member of club or organization: Not on file    Attends meetings of clubs or organizations: Not on file    Relationship status: Not on file  Other Topics Concern  . Not on file  Social History Narrative   Lives with wife--recently diagnosed with breast ca; No smoking; Occassional wine; Retired; 2 daughters live in [place]--5 grandchildren(boys). On weight watchers. Lives in [place] with wife. Retired Solicitor. Tobacco history 2 ppd x 32 years. No smoking x 25 years. No drugs.     Family History:  The patient's family history includes CAD in his child; Dementia in his sister; Hashimoto's thyroiditis in his child; Heart attack in his sister; Heart disease in his father, mother, and sister; Kidney failure in his father; Sjogren's syndrome in his child.   ROS:   Please see the history of present illness.    ROS All other systems reviewed and are negative.   PHYSICAL EXAM:   VS:  BP 132/72   Pulse 81   Temp 97.9 F (36.6 C)   Ht 5\' 9"  (1.753 m)   Wt 224 lb 9.6 oz (101.9 kg)   SpO2 97%   BMI 33.17 kg/m    GEN: Well nourished, obese, in no acute distress  HEENT: normal  Neck: no JVD, carotid bruits, or masses Cardiac: RRR; no murmurs, rubs, or gallops, tr edema  Respiratory:  clear to auscultation bilaterally, normal work of breathing GI: obese, soft, nontender, nondistended, + BS MS: no deformity or atrophy  Skin: warm and dry, no rash Neuro:  Alert and Oriented x 3, Strength and sensation are intact Psych: euthymic mood, full affect  Wt Readings from  Last 3 Encounters:  05/30/19 224 lb 9.6 oz (101.9 kg)  02/23/19 220 lb (99.8 kg)  02/15/19 217  lb (98.4 kg)      Studies/Labs Reviewed:   EKG:  EKG is not ordered today.    Recent Labs: 09/14/2018: TSH 5.70 02/23/2019: ALT 38; BUN 29; Creatinine, Ser 1.85; Hemoglobin 14.0; Platelets 158; Potassium 5.2; Sodium 138   Lipid Panel    Component Value Date/Time   CHOL 154 09/14/2018 1117   CHOL 153 07/09/2017 0938   TRIG 195.0 (H) 09/14/2018 1117   HDL 37.40 (L) 09/14/2018 1117   HDL 38 (L) 07/09/2017 0938   CHOLHDL 4 09/14/2018 1117   VLDL 39.0 09/14/2018 1117   LDLCALC 77 09/14/2018 1117   LDLCALC 75 07/09/2017 0938   LDLDIRECT 70 12/21/2012 1015    Additional studies/ records that were reviewed today include:   Cath 10/09/2015  Mid RCA lesion, 40% stenosed.  Prox LAD to Mid LAD lesion, 20% stenosed.  Ost 1st Mrg to 1st Mrg lesion, 40% stenosed.  The left ventricular systolic function is normal.  Prox RCA lesion, 70% stenosed. Post intervention, there is a 0% residual stenosis.   1. Single vessel obstructive CAD 2. Abnormal FFR of the proximal RCA lesion 3. Normal LV function 4. Successful stenting of the proximal RCA with a DES  Plan: DAPT for one year. Anticipate DC in am.     Echo 05/20/2018 LV EF: 60% -   65% Study Conclusions  - Left ventricle: The cavity size was normal. Systolic function was   normal. The estimated ejection fraction was in the range of 60%   to 65%. Wall motion was normal; there were no regional wall   motion abnormalities. Doppler parameters are consistent with   abnormal left ventricular relaxation (grade 1 diastolic   dysfunction). Doppler parameters are consistent with high   ventricular filling pressure (E/e&' 22). - Aortic valve: Valve mobility was moderately restricted. There was   mild stenosis. There was no regurgitation. Mean gradient (S): 12   mm Hg. Valve area (VTI): 1.5 cm^2. Valve area (Vmax): 1.6 cm^2.   Valve area (Vmean): 1.48 cm^2. Maximum velocity and gradient   obtained from right sternal border. - Mitral  valve: Moderately calcified annulus. Transvalvular   velocity was within the normal range. There was no evidence for   stenosis. There was mild regurgitation. Mean gradient (D): 3 mm   Hg (HR 61 bpm). - Left atrium: Poorly visualized. The atrium was moderately   dilated. - Right ventricle: Poorly visualized. The cavity size was normal.   Wall thickness was normal. Systolic function was normal. RV   systolic pressure (S, est): 42 mm Hg (using a right atrial   pressure estimate of 71mmHg. This maybe underestimated due to   poorly visualized IVC). - Right atrium: Poorly visualized. The atrium was mildly dilated. - Tricuspid valve: There was trivial regurgitation. - Pulmonic valve: There was trivial regurgitation. - Pulmonary arteries: Systolic pressure was moderately increased. - Inferior vena cava: Poorly visualized. The vessel was mildly   dilated. - Pericardium, extracardiac: There was no pericardial effusion.  Impressions:  - Normal left ventricular function, EF 60-65%. Grossly normal   regiona wall motion. Elevated LV filling pressure. Mild aortic   stenosis, mean systolic gradient 12 mmHg. Moderate mitral annular   calcification. RVSP 42 mmHg, with RA pressure estimate of 10   mmHg, moderate pulmonary hypertension.  ASSESSMENT:    1. PAF (paroxysmal atrial fibrillation) ([place])   2. Coronary  artery disease involving native coronary artery of native heart without angina pectoris   3. Hyperlipidemia LDL goal <70   4. Stage 4 chronic kidney disease ([place])   5. H/O TIA (transient ischemic attack) and stroke      PLAN:  In order of problems listed above:  1. CAD: Since his previous cardiac cath as a June 2017, he has had chronic stable angina.  He does not have any exertional chest discomfort with everyday activity only fleeting right sided CP. Will continue current therapy.  2. Hypertension: Blood pressure stable on current therapy  3. Hyperlipidemia: Intolerant to statins.   Currently on Zetia last LDL 77.  4. Bilateral carotid artery disease: Followed by vascular surgery  5. Paroxysmal atrial fibrillation: Continue on Xarelto 15 mg daily due to renal function, rate controlled using carvedilol. Symptoms are infrequent and not limiting so I would not recommend AAD therapy or ablation.  6. History of TIA: No recurrence    Medication Adjustments/Labs and Tests Ordered: Current medicines are reviewed at length with the patient today.  Concerns regarding medicines are outlined above.  Medication changes, Labs and Tests ordered today are listed in the Patient Instructions below. There are no Patient Instructions on file for this visit.   Signed, Tallis Soledad Martinique, MD  05/30/2019 2:39 PM    [facility] [employer] [employer], [place], [place]  [address] Phone: [phone]; Fax: [phone]

## 2019-05-30 ENCOUNTER — Encounter: Payer: Self-pay | Admitting: Cardiology

## 2019-05-30 ENCOUNTER — Other Ambulatory Visit: Payer: Self-pay

## 2019-05-30 ENCOUNTER — Ambulatory Visit: Payer: Medicare Other | Admitting: Cardiology

## 2019-05-30 VITALS — BP 132/72 | HR 81 | Temp 97.9°F | Ht 69.0 in | Wt 224.6 lb

## 2019-05-30 DIAGNOSIS — Z8673 Personal history of transient ischemic attack (TIA), and cerebral infarction without residual deficits: Secondary | ICD-10-CM

## 2019-05-30 DIAGNOSIS — I480 Paroxysmal atrial fibrillation: Secondary | ICD-10-CM | POA: Diagnosis not present

## 2019-05-30 DIAGNOSIS — E785 Hyperlipidemia, unspecified: Secondary | ICD-10-CM

## 2019-05-30 DIAGNOSIS — I2510 Atherosclerotic heart disease of native coronary artery without angina pectoris: Secondary | ICD-10-CM | POA: Diagnosis not present

## 2019-05-30 DIAGNOSIS — N184 Chronic kidney disease, stage 4 (severe): Secondary | ICD-10-CM | POA: Diagnosis not present

## 2019-05-30 NOTE — Patient Instructions (Signed)
Medication Instructions:  Continue current medication  *If you need a refill on your cardiac medications before your next appointment, please call your pharmacy*  Lab Work: None ordered  Testing/Procedures: None Ordered  Follow-Up: At [facility], you and your health needs are our priority.  As part of our continuing mission to provide you with exceptional heart care, we have created designated Provider Care Teams.  These Care Teams include your primary Cardiologist (physician) and Advanced Practice Providers (APPs -  Physician Assistants and Nurse Practitioners) who all work together to provide you with the care you need, when you need it.  Your next appointment:   6 month(s)  The format for your next appointment:   In Person  Provider:   Peter Martinique, MD

## 2019-06-02 ENCOUNTER — Other Ambulatory Visit: Payer: Self-pay | Admitting: Physician Assistant

## 2019-07-03 ENCOUNTER — Other Ambulatory Visit: Payer: Self-pay | Admitting: Cardiology

## 2019-07-21 ENCOUNTER — Other Ambulatory Visit: Payer: Self-pay | Admitting: Cardiology

## 2019-08-14 ENCOUNTER — Other Ambulatory Visit: Payer: Self-pay | Admitting: Cardiology

## 2019-08-15 NOTE — Progress Notes (Signed)
[place] at [facility] [address], [place], [place] [address] [phone]  Date:  08/16/2019   Name:  [name]   DOB:  05-07-1934   MRN:  [id number]  PCP:  [name], MD    Chief Complaint: Constipation   History of Present Illness:  [name] is a 86 y.o. very pleasant male patient who presents with the following:  In person visit today Patient with history of hypertension, TIA, CAD, paroxysmal A. fib, carotid artery occlusion status post endarterectomy, sleep apnea, CKD, hyperlipidemia, memory loss, poor hearing, prostate cancer status post radical prostatectomy in 1998, hard of hearing  Accompanied by his wife today who contributes to the history  He saw his cardiologist, Dr. Martinique, in November He has chronic stable angina, no changes made at that time Intolerant to statins, taking Zetia Xarelto 15 mg daily for paroxysmal A. Fib  Flu vaccine- declines today  Most recent blood work in August PSA checked in March 2020, 0.0  He notes that he had called last week thinking he was doing a phone visit, but then he got a mychart message telling him to come in He is mainly concerned today about whether or not he can continue to use MiraLAX.  He takes a dose of MiraLAX most days, has done so for years.  He does this to prevent and treat constipation.  He became concerned because he noticed the packet of MiraLAX said to avoid use in kidney disease unless supervised by Dr.  He should be seeing Dr Joelyn Oms soon to check on his renal function  They also noticed that he has felt more tired over the last several months, he is interested in updating labs.  He has a physical scheduled with me for next month Patient Active Problem List   Diagnosis Date Noted  . Acute diverticulitis 02/07/2019  . Angina pectoris, variant ([place]) 05/19/2018  . Poor compliance with CPAP treatment 04/20/2018  . Excessive daytime sleepiness  03/22/2018  . Poor sleep hygiene 03/22/2018  . Ventral hernia without obstruction or gangrene 10/09/2017  . TIA (transient ischemic attack)   . Kidney stone   . Hypertension   . Hearing aid worn   . Coronary artery disease   . Asthma due to seasonal allergies   . Arthritis   . Erosive gastropathy 03/26/2017  . Acute blood loss anemia 03/25/2017  . Melena 03/24/2017  . CKD (chronic kidney disease), stage III ([place]) 03/24/2017  . AF (paroxysmal atrial fibrillation) ([place]) 03/24/2017  . Angina pectoris ([place]) 10/09/2015  . Essential hypertension   . Hyperlipemia   . Carotid artery occlusion   . Dizziness 08/09/2015  . Gait instability 05/30/2015  . Hypothyroidism, acquired 10/26/2013  . OSA (obstructive sleep apnea) 10/18/2013  . Benign localized hyperplasia of prostate with urinary obstruction and other lower urinary tract symptoms (LUTS)(600.21) 12/21/2012  . Carcinoma in situ of prostate 12/21/2012  . Left shoulder pain 08/08/2012  . Obesity 07/24/2011  . Generalized anxiety disorder 07/24/2011  . DIVERTICULOSIS OF COLON 12/25/2009  . DERMATITIS, SEBORRHEIC 12/25/2009  . 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 Memory loss 08/14/2009  . VENOUS INSUFFICIENCY, CHRONIC 09/09/2006  . GASTROESOPHAGEAL REFLUX, NO ESOPHAGITIS 09/09/2006  . HERNIA, HIATAL, NONCONGENITAL 09/09/2006  . INSOMNIA NOS 09/09/2006    Past Medical History:  Diagnosis Date  . Anginal pain ([place])   . Anxiety    " OCCASIONAL"  . Arthritis   . Asthma  due to seasonal allergies    uses inhalers prn  . Carotid artery occlusion    Left  . Complication of anesthesia    " had tremors after prostate surgery  . Coronary artery disease   . Diverticulitis    recurrent  . Family history of anesthesia complication    MOTHER   . GERD (gastroesophageal reflux disease)   . H/O hiatal hernia   . Hearing aid worn   . Hyperlipemia   . Hypertension   . Kidney stone    lithotripsy 2353 w complications, req stents,  Dr Rosana Hoes  . Prostate CA ([place])   . Shortness of breath   . Thyroid disease   . TIA (transient ischemic attack)   . Tinnitus     Past Surgical History:  Procedure Laterality Date  . CARDIAC CATHETERIZATION  2008   minimal dz, Dr Cathie Olden  . CARDIAC CATHETERIZATION N/A 10/09/2015   Procedure: Left Heart Cath and Coronary Angiography;  Surgeon: Peter M Martinique, MD;  Location: [place] [facility];  Service: Cardiovascular;  Laterality: N/A;  . CARDIAC CATHETERIZATION N/A 10/09/2015   Procedure: Intravascular Pressure Wire/FFR Study;  Surgeon: Peter M Martinique, MD;  Location: [place] [facility];  Service: Cardiovascular;  Laterality: N/A;  . CARDIAC CATHETERIZATION N/A 10/09/2015   Procedure: Coronary Stent Intervention;  Surgeon: Peter M Martinique, MD;  Location: [place] [facility];  Service: Cardiovascular;  Laterality: N/A;  . CAROTID ENDARTERECTOMY  Left   1998  . CHOLECYSTECTOMY    . CORONARY STENT PLACEMENT  10/09/2015   DES to RCA  . ESOPHAGOGASTRODUODENOSCOPY (EGD) WITH PROPOFOL Left 03/26/2017   Procedure: ESOPHAGOGASTRODUODENOSCOPY (EGD) WITH PROPOFOL;  Surgeon: Arta Silence, MD;  Location: [facility];  Service: Endoscopy;  Laterality: Left;  . LEFT HEART CATHETERIZATION WITH CORONARY ANGIOGRAM N/A 09/15/2013   Procedure: LEFT HEART CATHETERIZATION WITH CORONARY ANGIOGRAM;  Surgeon: Peter M Martinique, MD;  Location: [facility];  Service: Cardiovascular;  Laterality: N/A;  . PROSTATECTOMY  1998   radical for prostate cancer    Social History   Tobacco Use  . Smoking status: Former Smoker    Packs/day: 2.50    Years: 30.00    Pack years: 75.00    Types: Cigarettes    Quit date: 08/28/1985    Years since quitting: 33.9  . Smokeless tobacco: Never Used  Substance Use Topics  . Alcohol use: Yes    Alcohol/week: 2.0 standard drinks    Types: 2 Standard drinks or equivalent per week    Comment: 1 glass wine/week (prior 1 glass martini with dinner)  . Drug use: No    Family History   Problem Relation Age of Onset  . Heart disease Mother   . Heart disease Father   . Kidney failure Father   . Heart disease Sister   . Heart attack Sister   . Dementia Sister   . Sjogren's syndrome Child   . Hashimoto's thyroiditis Child   . CAD Child     Allergies  Allergen Reactions  . Ciprofloxacin Other (See Comments)    Hallucinations or jitteriness  . Codeine Other (See Comments)    Hallucinations   . Flexeril [Cyclobenzaprine] Other (See Comments)    "Made me feel goofy"  . Imdur [Isosorbide Nitrate] Other (See Comments)    Caused headaches  . Methocarbamol Other (See Comments)    "Made me feel goofy"  . Other Other (See Comments)    CANNOT HAVE ANY FOODS WITH SEEDS   .  Strawberry Extract Other (See Comments)    CANNOT HAVE ANY FOODS WITH SEEDS  . Sulfa Antibiotics Other (See Comments)    Chills and shaking- "serum sickness"   . Sulfamethoxazole Other (See Comments)    Chills and shaking- "serum sickness"  . Sulfonamide Derivatives Other (See Comments)    Chills and shaking "serum sickness"  . Tomato Other (See Comments)    CANNOT HAVE ANY FOODS WITH SEEDS  . Flagyl [Metronidazole] Rash    Medication list has been reviewed and updated.  Current Outpatient Medications on File Prior to Visit  Medication Sig Dispense Refill  . albuterol (VENTOLIN HFA) 108 (90 Base) MCG/ACT inhaler Inhale 2 puffs into the lungs every 6 (six) hours as needed for wheezing or shortness of breath. 18 g 5  . carvedilol (COREG) 6.25 MG tablet Take 1 tablet (6.25 mg total) by mouth See admin instructions. Take 6.25 mg by mouth one to two times a day 180 tablet 1  . clobetasol (TEMOVATE) 0.05 % external solution Apply 1 application topically daily.   3  . ezetimibe (ZETIA) 10 MG tablet TAKE 1 TABLET BY MOUTH EVERY DAY 90 tablet 3  . hydrocortisone 2.5 % cream Apply 1 application topically 2 (two) times daily.    Marland Kitchen ipratropium (ATROVENT) 0.03 % nasal spray Place 2 sprays into the nose 4  (four) times daily. 30 mL 5  . levothyroxine (SYNTHROID) 137 MCG tablet TAKE 1 TABLET BY MOUTH EVERY DAY 90 tablet 2  . losartan (COZAAR) 50 MG tablet TAKE 1 TABLET BY MOUTH EVERY DAY 90 tablet 0  . Naphazoline-Pheniramine (OPCON-A) 0.027-0.315 % SOLN Place 1-2 drops into both eyes 3 (three) times daily as needed (for itching, redness, and/or irritation).    . nitroGLYCERIN (NITROLINGUAL) 0.4 MG/SPRAY spray Place 1 spray under the tongue every 5 (five) minutes x 3 doses as needed for chest pain. 12 g 5  . triamcinolone cream (KENALOG) 0.1 % Apply 1 application topically daily as needed (for rashes).     Alveda Reasons 15 MG TABS tablet TAKE 1 TABLET (15 MG TOTAL) BY MOUTH DAILY WITH SUPPER. 90 tablet 1   No current facility-administered medications on file prior to visit.    Review of Systems:  As per HPI- otherwise negative.   Physical Examination: Vitals:   08/16/19 1624  BP: 124/70  Pulse: 70  Resp: 18  Temp: (!) 96.5 F (35.8 C)  SpO2: 98%   Vitals:   08/16/19 1624  Weight: 224 lb (101.6 kg)  Height: 5\' 9"  (1.753 m)   Body mass index is 33.08 kg/m. Ideal Body Weight: Weight in (lb) to have BMI = 25: 168.9  GEN: WDWN, NAD, Non-toxic, A & O x 3, obese, appears his normal self HEENT: Atraumatic, Normocephalic. Neck supple. No masses, No LAD.  Hard of hearing Ears and Nose: No external deformity. CV: RRR, No M/G/R. No JVD. No thrill. No extra heart sounds. PULM: CTA B, no wheezes, crackles, rhonchi. No retractions. No resp. distress. No accessory muscle use. ABD: S, NT, ND, +BS. No rebound. No HSM.  Abdomen is benign EXTR: No c/c/e NEURO Normal gait.  PSYCH: Normally interactive. Conversant. Not depressed or anxious appearing.  Calm demeanor. '  Assessment and Plan: Essential hypertension  Stage 3 chronic kidney disease, unspecified whether stage 3a or 3b CKD   Patient with history of chronic renal insufficiency, followed by nephrology.  His baseline creatinine recently  between 1.75 and 2 I reassured him that he is okay to continue  using MiraLAX, we just need to regularly monitor his renal function and electrolytes He is also overdue for lab work-unfortunately this late in the day our lab is already closed.  I asked them to move his upcoming physical to a closer date, so we can follow-up; they plan to do so  Medical decision making today is low This visit occurred during the SARS-CoV-2 public health emergency.  Safety protocols were in place, including screening questions prior to the visit, additional usage of staff PPE, and extensive cleaning of exam room while observing appropriate contact time as indicated for disinfecting solutions.     Signed Lamar Blinks, MD

## 2019-08-16 ENCOUNTER — Ambulatory Visit: Payer: Medicare Other | Admitting: Family Medicine

## 2019-08-16 ENCOUNTER — Encounter: Payer: Self-pay | Admitting: Family Medicine

## 2019-08-16 ENCOUNTER — Other Ambulatory Visit: Payer: Self-pay

## 2019-08-16 VITALS — BP 124/70 | HR 70 | Temp 96.5°F | Resp 18 | Ht 69.0 in | Wt 224.0 lb

## 2019-08-16 DIAGNOSIS — I10 Essential (primary) hypertension: Secondary | ICD-10-CM

## 2019-08-16 DIAGNOSIS — N183 Chronic kidney disease, stage 3 unspecified: Secondary | ICD-10-CM | POA: Diagnosis not present

## 2019-08-16 NOTE — Patient Instructions (Signed)
Ok to continue miralax as you are currently.  I will see you next month for your physical

## 2019-08-17 ENCOUNTER — Ambulatory Visit: Payer: Medicare Other | Admitting: Family Medicine

## 2019-09-15 NOTE — Progress Notes (Signed)
Nurse connected with patient 09/18/19 at 10:15 AM EST by a telephone enabled telemedicine application and verified that I am speaking with the correct person using two identifiers. Patient stated full name and DOB. Patient gave permission to continue with virtual visit. Patient's location was at home and Nurse's location was at [place] office.   Subjective:   [name] is a 86 y.o. male who presents for an Initial Medicare Annual Wellness Visit.  Review of Systems  Home Safety/Smoke Alarms: Feels safe in home. Smoke alarms in place.  Lives w/ wife in 2 story home. Uses cane.  Male:     PSA-  Lab Results  Component Value Date   PSA 0.00 (L) 09/14/2018   PSA 0.00 Repeated and verified X2. (L) 05/11/2016   PSA <0.01 03/25/2015      Objective:      Advanced Directives 09/18/2019 02/23/2019 10/28/2017 07/09/2017 03/24/2017 03/24/2017 11/30/2016  Does Patient Have a Medical Advance Directive? Yes Yes Yes No Yes Yes Yes  Type of Paramedic of [place];Living will - [place];Living will - [place];Living will [place];Living will [place];Living will  Does patient want to make changes to medical advance directive? No - Patient declined - - - No - Patient declined - -  Copy of [place] in Chart? No - copy requested - No - copy requested - No - copy requested - -  Would patient like information on creating a medical advance directive? - - - - - - -    Current Medications (verified) Outpatient Encounter Medications as of 09/18/2019  Medication Sig  . albuterol (VENTOLIN HFA) 108 (90 Base) MCG/ACT inhaler Inhale 2 puffs into the lungs every 6 (six) hours as needed for wheezing or shortness of breath.  . carvedilol (COREG) 6.25 MG tablet Take 1 tablet (6.25 mg total) by mouth See admin instructions. Take 6.25 mg by mouth one to two times a day  . clobetasol (TEMOVATE)  0.05 % external solution Apply 1 application topically daily.   Marland Kitchen ezetimibe (ZETIA) 10 MG tablet TAKE 1 TABLET BY MOUTH EVERY DAY  . hydrocortisone 2.5 % cream Apply 1 application topically 2 (two) times daily.  Marland Kitchen ipratropium (ATROVENT) 0.03 % nasal spray Place 2 sprays into the nose 4 (four) times daily.  Marland Kitchen levothyroxine (SYNTHROID) 137 MCG tablet TAKE 1 TABLET BY MOUTH EVERY DAY  . losartan (COZAAR) 50 MG tablet TAKE 1 TABLET BY MOUTH EVERY DAY  . Naphazoline-Pheniramine (OPCON-A) 0.027-0.315 % SOLN Place 1-2 drops into both eyes 3 (three) times daily as needed (for itching, redness, and/or irritation).  . triamcinolone cream (KENALOG) 0.1 % Apply 1 application topically daily as needed (for rashes).   Alveda Reasons 15 MG TABS tablet TAKE 1 TABLET (15 MG TOTAL) BY MOUTH DAILY WITH SUPPER.  . nitroGLYCERIN (NITROLINGUAL) 0.4 MG/SPRAY spray Place 1 spray under the tongue every 5 (five) minutes x 3 doses as needed for chest pain. (Patient not taking: Reported on 09/18/2019)   No facility-administered encounter medications on file as of 09/18/2019.    Allergies (verified) Ciprofloxacin, Codeine, Flexeril [cyclobenzaprine], Imdur [isosorbide nitrate], Methocarbamol, Other, Strawberry extract, Sulfa antibiotics, Sulfamethoxazole, Sulfonamide derivatives, Tomato, and Flagyl [metronidazole]   History: Past Medical History:  Diagnosis Date  . Anginal pain ([place])   . Anxiety    " OCCASIONAL"  . Arthritis   . Asthma due to seasonal allergies    uses inhalers prn  .  Carotid artery occlusion    Left  . Complication of anesthesia    " had tremors after prostate surgery  . Coronary artery disease   . Diverticulitis    recurrent  . Family history of anesthesia complication    MOTHER   . GERD (gastroesophageal reflux disease)   . H/O hiatal hernia   . Hearing aid worn   . Hyperlipemia   . Hypertension   . Kidney stone    lithotripsy 8466 w complications, req stents, Dr Rosana Hoes  . Prostate CA ([place])     . Shortness of breath   . Thyroid disease   . TIA (transient ischemic attack)   . Tinnitus    Past Surgical History:  Procedure Laterality Date  . CARDIAC CATHETERIZATION  2008   minimal dz, Dr Cathie Olden  . CARDIAC CATHETERIZATION N/A 10/09/2015   Procedure: Left Heart Cath and Coronary Angiography;  Surgeon: Peter M Martinique, MD;  Location: [place] [facility];  Service: Cardiovascular;  Laterality: N/A;  . CARDIAC CATHETERIZATION N/A 10/09/2015   Procedure: Intravascular Pressure Wire/FFR Study;  Surgeon: Peter M Martinique, MD;  Location: [place] [facility];  Service: Cardiovascular;  Laterality: N/A;  . CARDIAC CATHETERIZATION N/A 10/09/2015   Procedure: Coronary Stent Intervention;  Surgeon: Peter M Martinique, MD;  Location: [place];  Service: Cardiovascular;  Laterality: N/A;  . CAROTID ENDARTERECTOMY  Left   1998  . CHOLECYSTECTOMY    . CORONARY STENT PLACEMENT  10/09/2015   DES to RCA  . ESOPHAGOGASTRODUODENOSCOPY (EGD) WITH PROPOFOL Left 03/26/2017   Procedure: ESOPHAGOGASTRODUODENOSCOPY (EGD) WITH PROPOFOL;  Surgeon: Arta Silence, MD;  [facility]: [facility];  Service: Endoscopy;  Laterality: Left;  . LEFT HEART CATHETERIZATION WITH CORONARY ANGIOGRAM N/A 09/15/2013   Procedure: LEFT HEART CATHETERIZATION WITH CORONARY ANGIOGRAM;  Surgeon: Peter M Martinique, MD;  Location: [facility];  Service: Cardiovascular;  Laterality: N/A;  . PROSTATECTOMY  1998   radical for prostate cancer   Family History  Problem Relation Age of Onset  . Heart disease Mother   . Heart disease Father   . Kidney failure Father   . Heart disease Sister   . Heart attack Sister   . Dementia Sister   . Sjogren's syndrome Child   . Hashimoto's thyroiditis Child   . CAD Child    Social History   Socioeconomic History  . Marital status: Married    Spouse name: Not on file  . Number of children: Not on file  . Years of education: Not on file  . Highest education level: Not on file  Occupational  History  . Occupation: retired    Comment: Actor [employer]  Tobacco Use  . Smoking status: Former Smoker    Packs/day: 2.50    Years: 30.00    Pack years: 75.00    Types: Cigarettes    Quit date: 08/28/1985    Years since quitting: 34.0  . Smokeless tobacco: Never Used  Substance and Sexual Activity  . Alcohol use: Yes    Alcohol/week: 2.0 standard drinks    Types: 2 Standard drinks or equivalent per week    Comment: 1 glass wine/week (prior 1 glass martini with dinner)  . Drug use: No  . Sexual activity: Yes    Birth control/protection: None  Other Topics Concern  . Not on file  Social History Narrative   Lives with wife--recently diagnosed with breast ca; No smoking; Occassional wine; Retired; 2 daughters live in [place]--5 grandchildren(boys). On weight  watchers. Lives in [place] with wife. Retired Solicitor. Tobacco history 2 ppd x 32 years. No smoking x 25 years. No drugs.   Social Determinants of Health   Financial Resource Strain:   . Difficulty of Paying Living Expenses: Not on file  Food Insecurity:   . Worried About Charity fundraiser in the Last Year: Not on file  . Ran Out of Food in the Last Year: Not on file  Transportation Needs:   . Lack of Transportation (Medical): Not on file  . Lack of Transportation (Non-Medical): Not on file  Physical Activity:   . Days of Exercise per Week: Not on file  . Minutes of Exercise per Session: Not on file  Stress:   . Feeling of Stress : Not on file  Social Connections:   . Frequency of Communication with Friends and Family: Not on file  . Frequency of Social Gatherings with Friends and Family: Not on file  . Attends Religious Services: Not on file  . Active Member of Clubs or Organizations: Not on file  . Attends Archivist Meetings: Not on file  . Marital Status: Not on file   Tobacco Counseling Counseling given: Not Answered   Clinical Intake: Pain : No/denies  pain   Activities of Daily Living In your present state of health, do you have any difficulty performing the following activities: 09/18/2019 02/08/2019  Hearing? Tempie Donning  Comment wears hearing aids -  Vision? N N  Difficulty concentrating or making decisions? Y N  Walking or climbing stairs? N N  Dressing or bathing? N N  Doing errands, shopping? Y N  Preparing Food and eating ? N -  Using the Toilet? N -  In the past six months, have you accidently leaked urine? N -  Do you have problems with loss of bowel control? N -  Managing your Medications? N -  Managing your Finances? N -  Housekeeping or managing your Housekeeping? N -  Some recent data might be hidden     Immunizations and Health Maintenance Immunization History  Administered Date(s) Administered  . Influenza Split 04/29/2011  . Influenza Whole 05/18/2007, 03/13/2008, 04/01/2010  . Influenza, High Dose Seasonal PF 03/23/2015, 05/12/2018  . Influenza,inj,Quad PF,6+ Mos 04/22/2013, 03/21/2014  . Influenza-Unspecified 04/17/2013, 03/29/2017  . Pneumococcal Conjugate-13 09/25/2015  . Pneumococcal Polysaccharide-23 03/13/2005, 07/18/2014  . Td 09/25/2008  . Zoster 02/07/2010   Health Maintenance Due  Topic Date Due  . TETANUS/TDAP  09/26/2018    Patient Care Team: Copland, Gay Filler, MD as PCP - General (Family Medicine) Martinique, Peter M, MD as PCP - Cardiology (Cardiology) Myrlene Broker, MD as Attending Physician (Urology) [name], MD as Consulting Physician (Gastroenterology)  Indicate any recent Medical Services you may have received from other than [facility] providers in the past year (date may be approximate).    Assessment:   This is a routine wellness examination for Khalik. Physical assessment deferred to PCP.  Hearing/Vision screen Unable to assess. This visit is enabled though telemedicine due to Covid 19.   Dietary issues and exercise activities discussed: Current Exercise Habits: The patient  does not participate in regular exercise at present, Exercise limited by: None identified Diet (meal preparation, eat out, water intake, caffeinated beverages, dairy products, fruits and vegetables): well balanced  Goals   None    Depression Screen PHQ 2/9 Scores 09/18/2019 06/28/2017 08/09/2015 05/30/2015  PHQ - 2 Score 0 0 0 0  Exception Documentation - -  Patient refusal -    Fall Risk Fall Risk  09/18/2019 06/28/2017 08/09/2015 05/30/2015 04/03/2015  Falls in the past year? 0 No No No No  Number falls in past yr: 0 - - - -  Injury with Fall? 0 - - - -  Follow up Education provided;Falls prevention discussed - - - -   Cognitive Function:   [place] Cognitive Assessment  06/17/2015  Visuospatial/ Executive (0/5) 5  Naming (0/3) 3  Attention: Read list of digits (0/2) 2  Attention: Read list of letters (0/1) 1  Attention: Serial 7 subtraction starting at 100 (0/3) 3  Language: Repeat phrase (0/2) 2  Language : Fluency (0/1) 1  Abstraction (0/2) 2  Delayed Recall (0/5) 4  Orientation (0/6) 6  Total 29  Adjusted Score (based on education) 29   6CIT Screen 09/18/2019  What Year? 0 points  What month? 0 points  What time? 0 points  Count back from 20 0 points  Months in reverse 0 points  Repeat phrase 0 points  Total Score 0    Screening Tests Health Maintenance  Topic Date Due  . TETANUS/TDAP  09/26/2018  . INFLUENZA VACCINE  10/11/2019 (Originally 02/11/2019)  . PNA vac Low Risk Adult  Completed        Plan:    Please schedule your next medicare wellness visit with me in 1 yr.  Continue to eat heart healthy diet (full of fruits, vegetables, whole grains, lean protein, water--limit salt, fat, and sugar intake) and increase physical activity as tolerated.  Continue doing brain stimulating activities (puzzles, reading, adult coloring books, staying active) to keep memory sharp.     I have personally reviewed and noted the following in the patient's chart:   . Medical  and social history . Use of alcohol, tobacco or illicit drugs  . Current medications and supplements . Functional ability and status . Nutritional status . Physical activity . Advanced directives . List of other physicians . Hospitalizations, surgeries, and ER visits in previous 12 months . Vitals . Screenings to include cognitive, depression, and falls . Referrals and appointments  In addition, I have reviewed and discussed with patient certain preventive protocols, quality metrics, and best practice recommendations. A written personalized care plan for preventive services as well as general preventive health recommendations were provided to patient.     Shela Nevin, [place]   09/18/2019

## 2019-09-18 ENCOUNTER — Encounter: Payer: Medicare Other | Admitting: Family Medicine

## 2019-09-18 ENCOUNTER — Encounter: Payer: Self-pay | Admitting: *Deleted

## 2019-09-18 ENCOUNTER — Ambulatory Visit (INDEPENDENT_AMBULATORY_CARE_PROVIDER_SITE_OTHER): Payer: Medicare Other | Admitting: *Deleted

## 2019-09-18 ENCOUNTER — Other Ambulatory Visit: Payer: Self-pay

## 2019-09-18 DIAGNOSIS — Z0000 Encounter for general adult medical examination without abnormal findings: Secondary | ICD-10-CM

## 2019-09-18 NOTE — Patient Instructions (Addendum)
Please schedule your next medicare wellness visit with me in 1 yr.  Continue to eat heart healthy diet (full of fruits, vegetables, whole grains, lean protein, water--limit salt, fat, and sugar intake) and increase physical activity as tolerated.  Continue doing brain stimulating activities (puzzles, reading, adult coloring books, staying active) to keep memory sharp.    [name] , Thank you for taking time to come for your Medicare Wellness Visit. I appreciate your ongoing commitment to your health goals. Please review the following plan we discussed and let me know if I can assist you in the future.   These are the goals we discussed: Goals    . Maintain health       This is a list of the screening recommended for you and due dates:  Health Maintenance  Topic Date Due  . Tetanus Vaccine  09/26/2018  . Flu Shot  10/11/2019*  . Pneumonia vaccines  Completed  *Topic was postponed. The date shown is not the original due date.    Preventive Care 71 Years and Older, Male Preventive care refers to lifestyle choices and visits with your health care provider that can promote health and wellness. This includes:  A yearly physical exam. This is also called an annual well check.  Regular dental and eye exams.  Immunizations.  Screening for certain conditions.  Healthy lifestyle choices, such as diet and exercise. What can I expect for my preventive care visit? Physical exam Your health care provider will check:  Height and weight. These may be used to calculate body mass index (BMI), which is a measurement that tells if you are at a healthy weight.  Heart rate and blood pressure.  Your skin for abnormal spots. Counseling Your health care provider may ask you questions about:  Alcohol, tobacco, and drug use.  Emotional well-being.  Home and relationship well-being.  Sexual activity.  Eating habits.  History of falls.  Memory and ability to understand  (cognition).  Work and work Statistician. What immunizations do I need?  Influenza (flu) vaccine  This is recommended every year. Tetanus, diphtheria, and pertussis (Tdap) vaccine  You may need a Td booster every 10 years. Varicella (chickenpox) vaccine  You may need this vaccine if you have not already been vaccinated. Zoster (shingles) vaccine  You may need this after age 36. Pneumococcal conjugate (PCV13) vaccine  One dose is recommended after age 77. Pneumococcal polysaccharide (PPSV23) vaccine  One dose is recommended after age 16. Measles, mumps, and rubella (MMR) vaccine  You may need at least one dose of MMR if you were born in 1957 or later. You may also need a second dose. Meningococcal conjugate (MenACWY) vaccine  You may need this if you have certain conditions. Hepatitis A vaccine  You may need this if you have certain conditions or if you travel or work in places where you may be exposed to hepatitis A. Hepatitis B vaccine  You may need this if you have certain conditions or if you travel or work in places where you may be exposed to hepatitis B. Haemophilus influenzae type b (Hib) vaccine  You may need this if you have certain conditions. You may receive vaccines as individual doses or as more than one vaccine together in one shot (combination vaccines). Talk with your health care provider about the risks and benefits of combination vaccines. What tests do I need? Blood tests  Lipid and cholesterol levels. These may be checked every 5 years, or more frequently depending  on your overall health.  Hepatitis C test.  Hepatitis B test. Screening  Lung cancer screening. You may have this screening every year starting at age 47 if you have a 30-pack-year history of smoking and currently smoke or have quit within the past 15 years.  Colorectal cancer screening. All adults should have this screening starting at age 75 and continuing until age 84. Your health  care provider may recommend screening at age 65 if you are at increased risk. You will have tests every 1-10 years, depending on your results and the type of screening test.  Prostate cancer screening. Recommendations will vary depending on your family history and other risks.  Diabetes screening. This is done by checking your blood sugar (glucose) after you have not eaten for a while (fasting). You may have this done every 1-3 years.  Abdominal aortic aneurysm (AAA) screening. You may need this if you are a current or former smoker.  Sexually transmitted disease (STD) testing. Follow these instructions at home: Eating and drinking  Eat a diet that includes fresh fruits and vegetables, whole grains, lean protein, and low-fat dairy products. Limit your intake of foods with high amounts of sugar, saturated fats, and salt.  Take vitamin and mineral supplements as recommended by your health care provider.  Do not drink alcohol if your health care provider tells you not to drink.  If you drink alcohol: ? Limit how much you have to 0-2 drinks a day. ? Be aware of how much alcohol is in your drink. In the [place] one drink equals one 12 oz bottle of beer (355 mL), one 5 oz glass of wine (148 mL), or one 1 oz glass of hard liquor (44 mL). Lifestyle  Take daily care of your teeth and gums.  Stay active. Exercise for at least 30 minutes on 5 or more days each week.  Do not use any products that contain nicotine or tobacco, such as cigarettes, e-cigarettes, and chewing tobacco. If you need help quitting, ask your health care provider.  If you are sexually active, practice safe sex. Use a condom or other form of protection to prevent STIs (sexually transmitted infections).  Talk with your health care provider about taking a low-dose aspirin or statin. What's next?  Visit your health care provider once a year for a well check visit.  Ask your health care provider how often you should have your  eyes and teeth checked.  Stay up to date on all vaccines. This information is not intended to replace advice given to you by your health care provider. Make sure you discuss any questions you have with your health care provider. Document Revised: 06/23/2018 Document Reviewed: 06/23/2018 Elsevier Patient Education  2020 [employer].

## 2019-09-19 ENCOUNTER — Other Ambulatory Visit: Payer: Self-pay

## 2019-09-19 NOTE — Patient Instructions (Addendum)
It was good to see you today, I will be in touch with your labs as soon as possible  It looks like you are due for a tetanus booster, please have this done at your pharmacy at your convenience You can take your labs with you when you see Dr Joelyn Oms   For your feet, I would recommend an OTC anti-fungal cream for athlete's foot as directed.  A foot cream that contains urea may also be helpful in getting rid of excess dry skin   We will try Aricept for your memory- take it once a day, let me know how this works for you We can increase for you if you like

## 2019-09-19 NOTE — Progress Notes (Addendum)
[place] [facility] [address] [place], [place] [address] [phone]  Date:  09/20/2019   Name:  [name]   DOB:  May 11, 1934   MRN:  [id number]  PCP:  [name], MD    Chief Complaint: Annual Exam   History of Present Illness:  [name] is a 86 y.o. very pleasant male patient who presents with the following:  Moderately complex patient here today for an in person follow-up visit-per visit scheduling notes he is concerned about abdominal distention; however per pt this is just chronic, he thinks due to being overweight Last seen by myself in February History of hypertension, TIA, CAD, paroxysmal atrial fib, carotid artery occlusion status post endarterectomy, sleep apnea, chronic renal disease, hyperlipidemia, memory loss, hard of hearing, prostate cancer status post radical prostatectomy in 1998, hypothyroidism He is having right shoulder pain- this is pretty constant He also has right sided back pain; however this seems to have resolved recently  He notes that his toes are swollen  He is accompanied by his wife Carlus Pavlov who contributes to the history today  His nephrologist is Dr. Joelyn Oms- he is seeing him in the next month or so Cardiologist is Dr. Buddy Duty visit with him was in November  He has chronic stable angina, most recent cardiac cath 2017 He is also followed by vascular surgery for his carotid artery disease For paroxysmal atrial fibrillation, he is taking Xarelto and carvedilol  Most recent lab work was in August 2020 He is overdue for thyroid level  Carvedilol Zetia Synthroid Losartan Xarelto  He is using atrovent nasal for constant rhinorrhea- this helps some  He does tend to cough up clear phlegm as well- I suggested that he try atrovent before he goes to bed as this may help with PND resultant chest congestion  He uses clobetasol for itchy scalp  He notes that he has to urinate  every 2-2.5 hours; will have urinary urgency when he does need to go  He notes that he feels tired during the day and often will take a nap every 2-3 hours  He does have CPAP for OSA but does not use it  He does have issues with gradually worsening memory as well -short-term memory is his biggest issue Patient Active Problem List   Diagnosis Date Noted  . Acute diverticulitis 02/07/2019  . Angina pectoris, variant ([place]) 05/19/2018  . Poor compliance with CPAP treatment 04/20/2018  . Excessive daytime sleepiness 03/22/2018  . Poor sleep hygiene 03/22/2018  . Ventral hernia without obstruction or gangrene 10/09/2017  . TIA (transient ischemic attack)   . Kidney stone   . Hypertension   . Hearing aid worn   . Coronary artery disease   . Asthma due to seasonal allergies   . Arthritis   . Erosive gastropathy 03/26/2017  . Acute blood loss anemia 03/25/2017  . Melena 03/24/2017  . CKD (chronic kidney disease), stage III ([place]) 03/24/2017  . AF (paroxysmal atrial fibrillation) ([place]) 03/24/2017  . Angina pectoris ([place]) 10/09/2015  . Essential hypertension   . Hyperlipemia   . Carotid artery occlusion   . Dizziness 08/09/2015  . Gait instability 05/30/2015  . Hypothyroidism, acquired 10/26/2013  . OSA (obstructive sleep apnea) 10/18/2013  . Benign localized hyperplasia of prostate with urinary obstruction and other lower urinary tract symptoms (LUTS)(600.21) 12/21/2012  . Carcinoma in situ of prostate 12/21/2012  . Left shoulder pain 08/08/2012  .  Obesity 07/24/2011  . Generalized anxiety disorder 07/24/2011  . DIVERTICULOSIS OF COLON 12/25/2009  . DERMATITIS, SEBORRHEIC 12/25/2009  . 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 Memory loss 08/14/2009  . VENOUS INSUFFICIENCY, CHRONIC 09/09/2006  . GASTROESOPHAGEAL REFLUX, NO ESOPHAGITIS 09/09/2006  . HERNIA, HIATAL, NONCONGENITAL 09/09/2006  . INSOMNIA NOS 09/09/2006    Past Medical History:  Diagnosis Date  . Anginal pain ([place])    . Anxiety    " OCCASIONAL"  . Arthritis   . Asthma due to seasonal allergies    uses inhalers prn  . Carotid artery occlusion    Left  . Complication of anesthesia    " had tremors after prostate surgery  . Coronary artery disease   . Diverticulitis    recurrent  . Family history of anesthesia complication    MOTHER   . GERD (gastroesophageal reflux disease)   . H/O hiatal hernia   . Hearing aid worn   . Hyperlipemia   . Hypertension   . Kidney stone    lithotripsy 6734 w complications, req stents, Dr Rosana Hoes  . Prostate CA ([place])   . Shortness of breath   . Thyroid disease   . TIA (transient ischemic attack)   . Tinnitus     Past Surgical History:  Procedure Laterality Date  . CARDIAC CATHETERIZATION  2008   minimal dz, Dr Cathie Olden  . CARDIAC CATHETERIZATION N/A 10/09/2015   Procedure: Left Heart Cath and Coronary Angiography;  Surgeon: Peter M [place]ique, MD;  Location: [facility];  Service: Cardiovascular;  Laterality: N/A;  . CARDIAC CATHETERIZATION N/A 10/09/2015   Procedure: Intravascular Pressure Wire/FFR Study;  Surgeon: Peter M [place], MD;  Location: [facility];  Service: Cardiovascular;  Laterality: N/A;  . CARDIAC CATHETERIZATION N/A 10/09/2015   Procedure: Coronary Stent Intervention;  Surgeon: Peter M [place], MD;  Location: [facility];  Service: Cardiovascular;  Laterality: N/A;  . CAROTID ENDARTERECTOMY  Left   1998  . CHOLECYSTECTOMY    . CORONARY STENT PLACEMENT  10/09/2015   DES to RCA  . ESOPHAGOGASTRODUODENOSCOPY (EGD) WITH PROPOFOL Left 03/26/2017   Procedure: ESOPHAGOGASTRODUODENOSCOPY (EGD) WITH PROPOFOL;  Surgeon: Arta Silence, MD;  Location: [facility];  Service: Endoscopy;  Laterality: Left;  . LEFT HEART CATHETERIZATION WITH CORONARY ANGIOGRAM N/A 09/15/2013   Procedure: LEFT HEART CATHETERIZATION WITH CORONARY ANGIOGRAM;  Surgeon: Peter M [place]ique, MD;  Location: [facility] CATH LAB;  Service: Cardiovascular;  Laterality: N/A;  .  PROSTATECTOMY  1998   radical for prostate cancer    Social History   Tobacco Use  . Smoking status: Former Smoker    Packs/day: 2.50    Years: 30.00    Pack years: 75.00    Types: Cigarettes    Quit date: 08/28/1985    Years since quitting: 34.0  . Smokeless tobacco: Never Used  Substance Use Topics  . Alcohol use: Yes    Alcohol/week: 2.0 standard drinks    Types: 2 Standard drinks or equivalent per week    Comment: 1 glass wine/week (prior 1 glass martini with dinner)  . Drug use: No    Family History  Problem Relation Age of Onset  . Heart disease Mother   . Heart disease Father   . Kidney failure Father   . Heart disease Sister   . Heart attack Sister   . Dementia Sister   . Sjogren's syndrome Child   . Hashimoto's thyroiditis Child   . CAD Child     Allergies  Allergen Reactions  . Ciprofloxacin Other (See Comments)    Hallucinations or jitteriness  . Codeine Other (See Comments)    Hallucinations   . Flexeril [Cyclobenzaprine] Other (See Comments)    "Made me feel goofy"  . Imdur [Isosorbide Nitrate] Other (See Comments)    Caused headaches  . Methocarbamol Other (See Comments)    "Made me feel goofy"  . Other Other (See Comments)    CANNOT HAVE ANY FOODS WITH SEEDS   . Strawberry Extract Other (See Comments)    CANNOT HAVE ANY FOODS WITH SEEDS  . Sulfa Antibiotics Other (See Comments)    Chills and shaking- "serum sickness"   . Sulfamethoxazole Other (See Comments)    Chills and shaking- "serum sickness"  . Sulfonamide Derivatives Other (See Comments)    Chills and shaking "serum sickness"  . Tomato Other (See Comments)    CANNOT HAVE ANY FOODS WITH SEEDS  . Flagyl [Metronidazole] Rash    Medication list has been reviewed and updated.  Current Outpatient Medications on File Prior to Visit  Medication Sig Dispense Refill  . albuterol (VENTOLIN HFA) 108 (90 Base) MCG/ACT inhaler Inhale 2 puffs into the lungs every 6 (six) hours as needed for  wheezing or shortness of breath. 18 g 5  . carvedilol (COREG) 6.25 MG tablet Take 1 tablet (6.25 mg total) by mouth See admin instructions. Take 6.25 mg by mouth one to two times a day 180 tablet 1  . clobetasol (TEMOVATE) 0.05 % external solution Apply 1 application topically daily.   3  . ezetimibe (ZETIA) 10 MG tablet TAKE 1 TABLET BY MOUTH EVERY DAY 90 tablet 3  . hydrocortisone 2.5 % cream Apply 1 application topically 2 (two) times daily.    Marland Kitchen ipratropium (ATROVENT) 0.03 % nasal spray Place 2 sprays into the nose 4 (four) times daily. 30 mL 5  . losartan (COZAAR) 50 MG tablet TAKE 1 TABLET BY MOUTH EVERY DAY 90 tablet 0  . Naphazoline-Pheniramine (OPCON-A) 0.027-0.315 % SOLN Place 1-2 drops into both eyes 3 (three) times daily as needed (for itching, redness, and/or irritation).    . nitroGLYCERIN (NITROLINGUAL) 0.4 MG/SPRAY spray Place 1 spray under the tongue every 5 (five) minutes x 3 doses as needed for chest pain. 12 g 5  . triamcinolone cream (KENALOG) 0.1 % Apply 1 application topically daily as needed (for rashes).     Alveda Reasons 15 MG TABS tablet TAKE 1 TABLET (15 MG TOTAL) BY MOUTH DAILY WITH SUPPER. 90 tablet 1   No current facility-administered medications on file prior to visit.    Review of Systems:  As per HPI- otherwise negative.   Physical Examination: Vitals:   09/20/19 1057  BP: 130/80  Pulse: 60  Resp: 16  Temp: (!) 96.1 F (35.6 C)  SpO2: 97%   Vitals:   09/20/19 1057  Weight: 227 lb (103 kg)  Height: 5\' 9"  (1.753 m)   Body mass index is 33.52 kg/m. Ideal Body Weight: Weight in (lb) to have BMI = 25: 168.9  GEN: no acute distress.  Obese, appears his normal self  HEENT: Atraumatic, Normocephalic.   Bilateral TM wnl, oropharynx normal.  PEERL,EOMI. hard of hearing, uses hearing aids Ears and Nose: No external deformity. CV: RRR, No M/G/R. No JVD. No thrill. No extra heart sounds. PULM: CTA B, no wheezes, crackles, rhonchi. No retractions. No resp.  distress. No accessory muscle use. ABD: S, NT, ND, +BS. No rebound. No HSM.  Central obesity is present  EXTR: No c/c/e PSYCH: Normally interactive. Conversant.  Toes with dry skin and some fungal involvement     Assessment and Plan: Stage 3 chronic kidney disease, unspecified whether stage 3a or 3b CKD - Plan: CBC, Basic metabolic panel  Essential hypertension  Paroxysmal atrial fibrillation (HCC)  TIA (transient ischemic attack) - Plan: Lipid panel  Acquired hypothyroidism - Plan: TSH, levothyroxine (SYNTHROID) 150 MCG tablet, DISCONTINUED: levothyroxine (SYNTHROID) 137 MCG tablet  Hyperlipidemia, unspecified hyperlipidemia type - Plan: Lipid panel  Urinary urgency - Plan: POCT urinalysis dipstick, Urine Culture  Memory loss - Plan: donepezil (ARICEPT) 5 MG tablet  Hematuria, unspecified type - Plan: Urine Microscopic Only  Here today for follow-up visit.  Labs pending as above.  I have suggested that he take his lab work with him to his upcoming nephrology appointment  Blood pressures under good control  He would like to try medication for memory, prescribed Aricept 5 mg.  Advised him that we can increase this dose if well tolerated and if it seems to be helpful  Microhematuria is present today, urine micro pending as well as urine culture  Discussed his other chronic symptoms today such as joint pain and difficulty with weight loss  Plan to follow-up in about 6 months Moderate medical decision making today This visit occurred during the SARS-CoV-2 public health emergency.  Safety protocols were in place, including screening questions prior to the visit, additional usage of staff PPE, and extensive cleaning of exam room while observing appropriate contact time as indicated for disinfecting solutions.  ' Signed Lamar Blinks, MD  Received his labs as below, message to patient Macrocytosis is not new, B12 and folate has been normal Change levothyroxine from 137 to 150  mcg Results for orders placed or performed in visit on 09/20/19  Urine Culture   Specimen: Blood  Result Value Ref Range   MICRO NUMBER: [id number]    SPECIMEN QUALITY: Adequate    Sample Source NOT GIVEN    STATUS: FINAL    ISOLATE 1:      Less than 10,000 CFU/mL of single Gram positive organism isolated. No further testing will be performed. If clinically indicated, recollection using a method to minimize contamination, with prompt transfer to Urine Culture Transport Tube, is recommended.  TSH  Result Value Ref Range   TSH 5.19 (H) 0.35 - 4.50 uIU/mL  CBC  Result Value Ref Range   WBC 6.3 4.0 - 10.5 K/uL   RBC 3.99 (L) 4.22 - 5.81 Mil/uL   Platelets 138.0 (L) 150.0 - 400.0 K/uL   Hemoglobin 14.3 13.0 - 17.0 g/dL   HCT 40.9 39.0 - 52.0 %   MCV 102.5 (H) 78.0 - 100.0 fl   MCHC 35.1 30.0 - 36.0 g/dL   RDW 15.0 11.5 - 02.7 %  Basic metabolic panel  Result Value Ref Range   Sodium 138 135 - 145 mEq/L   Potassium 4.3 3.5 - 5.1 mEq/L   Chloride 105 96 - 112 mEq/L   CO2 24 19 - 32 mEq/L   Glucose, Bld 102 (H) 70 - 99 mg/dL   BUN 24 (H) 6 - 23 mg/dL   Creatinine, Ser 1.75 (H) 0.40 - 1.50 mg/dL   GFR 37.31 (L) >60.00 mL/min   Calcium 9.6 8.4 - 10.5 mg/dL  Lipid panel  Result Value Ref Range   Cholesterol 144 0 - 200 mg/dL   Triglycerides 278.0 (H) 0.0 - 149.0 mg/dL   HDL 32.30 (L) >39.00 mg/dL   VLDL 55.6 (H)  0.0 - 40.0 mg/dL   Total CHOL/HDL Ratio 4    NonHDL 111.52   LDL cholesterol, direct  Result Value Ref Range   Direct LDL 87.0 mg/dL  POCT urinalysis dipstick  Result Value Ref Range   Color, UA yellow yellow   Clarity, UA clear clear   Glucose, UA negative negative mg/dL   Bilirubin, UA negative negative   Ketones, POC UA negative negative mg/dL   Spec Grav, UA 1.025 1.010 - 1.025   Blood, UA small (A) negative   pH, UA 5.0 5.0 - 8.0   Protein Ur, POC trace (A) negative mg/dL   Urobilinogen, UA 0.2 0.2 or 1.0 E.U./dL   Nitrite, UA Negative Negative   Leukocytes,  UA Negative Negative   Received his urine culture 3/12-message to pt

## 2019-09-20 ENCOUNTER — Encounter: Payer: Self-pay | Admitting: Family Medicine

## 2019-09-20 ENCOUNTER — Other Ambulatory Visit: Payer: Self-pay

## 2019-09-20 ENCOUNTER — Ambulatory Visit: Payer: Medicare Other | Admitting: Family Medicine

## 2019-09-20 VITALS — BP 130/80 | HR 60 | Temp 96.1°F | Resp 16 | Ht 66.0 in | Wt 227.0 lb

## 2019-09-20 DIAGNOSIS — R3915 Urgency of urination: Secondary | ICD-10-CM | POA: Diagnosis not present

## 2019-09-20 DIAGNOSIS — G459 Transient cerebral ischemic attack, unspecified: Secondary | ICD-10-CM | POA: Diagnosis not present

## 2019-09-20 DIAGNOSIS — N183 Chronic kidney disease, stage 3 unspecified: Secondary | ICD-10-CM

## 2019-09-20 DIAGNOSIS — R319 Hematuria, unspecified: Secondary | ICD-10-CM

## 2019-09-20 DIAGNOSIS — E785 Hyperlipidemia, unspecified: Secondary | ICD-10-CM | POA: Diagnosis not present

## 2019-09-20 DIAGNOSIS — I480 Paroxysmal atrial fibrillation: Secondary | ICD-10-CM

## 2019-09-20 DIAGNOSIS — I10 Essential (primary) hypertension: Secondary | ICD-10-CM

## 2019-09-20 DIAGNOSIS — E039 Hypothyroidism, unspecified: Secondary | ICD-10-CM

## 2019-09-20 DIAGNOSIS — R413 Other amnesia: Secondary | ICD-10-CM

## 2019-09-20 LAB — POCT URINALYSIS DIP (MANUAL ENTRY)
Bilirubin, UA: NEGATIVE
Glucose, UA: NEGATIVE mg/dL
Ketones, POC UA: NEGATIVE mg/dL
Leukocytes, UA: NEGATIVE
Nitrite, UA: NEGATIVE
Spec Grav, UA: 1.025 (ref 1.010–1.025)
Urobilinogen, UA: 0.2 E.U./dL
pH, UA: 5 (ref 5.0–8.0)

## 2019-09-20 LAB — BASIC METABOLIC PANEL
BUN: 24 mg/dL — ABNORMAL HIGH (ref 6–23)
CO2: 24 mEq/L (ref 19–32)
Calcium: 9.6 mg/dL (ref 8.4–10.5)
Chloride: 105 mEq/L (ref 96–112)
Creatinine, Ser: 1.75 mg/dL — ABNORMAL HIGH (ref 0.40–1.50)
GFR: 37.31 mL/min — ABNORMAL LOW (ref >60.00)
Glucose, Bld: 102 mg/dL — ABNORMAL HIGH (ref 70–99)
Potassium: 4.3 mEq/L (ref 3.5–5.1)
Sodium: 138 mEq/L (ref 135–145)

## 2019-09-20 LAB — LIPID PANEL
Cholesterol: 144 mg/dL (ref 0–200)
HDL: 32.3 mg/dL — ABNORMAL LOW (ref >39.00)
NonHDL: 111.52
Total CHOL/HDL Ratio: 4
Triglycerides: 278 mg/dL — ABNORMAL HIGH (ref 0.0–149.0)
VLDL: 55.6 mg/dL — ABNORMAL HIGH (ref 0.0–40.0)

## 2019-09-20 LAB — CBC
HCT: 40.9 % (ref 39.0–52.0)
Hemoglobin: 14.3 g/dL (ref 13.0–17.0)
MCHC: 35.1 g/dL (ref 30.0–36.0)
MCV: 102.5 fl — ABNORMAL HIGH (ref 78.0–100.0)
Platelets: 138 10*3/uL — ABNORMAL LOW (ref 150.0–400.0)
RBC: 3.99 Mil/uL — ABNORMAL LOW (ref 4.22–5.81)
RDW: 15 % (ref 11.5–15.5)
WBC: 6.3 10*3/uL (ref 4.0–10.5)

## 2019-09-20 LAB — LDL CHOLESTEROL, DIRECT: Direct LDL: 87 mg/dL

## 2019-09-20 LAB — TSH: TSH: 5.19 u[IU]/mL — ABNORMAL HIGH (ref 0.35–4.50)

## 2019-09-20 MED ORDER — LEVOTHYROXINE SODIUM 150 MCG PO TABS: 150.0000 ug | ORAL_TABLET | Freq: Every day | ORAL | 6 refills | Status: DC

## 2019-09-20 MED ORDER — DONEPEZIL HCL 5 MG PO TABS: 5.0000 mg | ORAL_TABLET | Freq: Every day | ORAL | 6 refills | Status: DC

## 2019-09-20 MED ORDER — LEVOTHYROXINE SODIUM 137 MCG PO TABS: 137.0000 ug | ORAL_TABLET | Freq: Every day | ORAL | 3 refills | Status: DC

## 2019-09-20 NOTE — Addendum Note (Signed)
Addended by: Lamar Blinks C on: 09/20/2019 07:36 PM   Modules accepted: Orders

## 2019-09-21 LAB — URINE CULTURE
MICRO NUMBER:: 10235783
SPECIMEN QUALITY:: ADEQUATE

## 2019-09-22 ENCOUNTER — Encounter: Payer: Self-pay | Admitting: Family Medicine

## 2019-09-22 DIAGNOSIS — R319 Hematuria, unspecified: Secondary | ICD-10-CM

## 2019-09-27 NOTE — Addendum Note (Signed)
Addended by: Lamar Blinks C on: 09/27/2019 12:59 PM   Modules accepted: Orders

## 2019-09-28 MED ORDER — THYROID 90 MG PO TABS: 90.0000 mg | ORAL_TABLET | Freq: Every day | ORAL | 5 refills | Status: DC

## 2019-09-28 NOTE — Addendum Note (Signed)
Addended by: Lamar Blinks C on: 09/28/2019 12:49 PM   Modules accepted: Orders

## 2019-10-03 ENCOUNTER — Other Ambulatory Visit: Payer: Self-pay | Admitting: Cardiology

## 2019-10-03 ENCOUNTER — Other Ambulatory Visit: Payer: Self-pay | Admitting: Family Medicine

## 2019-10-23 ENCOUNTER — Encounter: Payer: Self-pay | Admitting: Family Medicine

## 2019-10-23 DIAGNOSIS — E039 Hypothyroidism, unspecified: Secondary | ICD-10-CM

## 2019-10-26 ENCOUNTER — Encounter: Payer: Self-pay | Admitting: Family Medicine

## 2019-10-31 ENCOUNTER — Other Ambulatory Visit (INDEPENDENT_AMBULATORY_CARE_PROVIDER_SITE_OTHER): Payer: Medicare Other

## 2019-10-31 ENCOUNTER — Other Ambulatory Visit: Payer: Self-pay

## 2019-10-31 DIAGNOSIS — E039 Hypothyroidism, unspecified: Secondary | ICD-10-CM

## 2019-11-01 ENCOUNTER — Encounter: Payer: Self-pay | Admitting: Family Medicine

## 2019-11-01 DIAGNOSIS — E039 Hypothyroidism, unspecified: Secondary | ICD-10-CM

## 2019-11-01 LAB — TSH: TSH: 13.1 u[IU]/mL — ABNORMAL HIGH (ref 0.35–4.50)

## 2019-11-01 MED ORDER — THYROID 120 MG PO TABS: 120.0000 mg | ORAL_TABLET | Freq: Every day | ORAL | 6 refills | Status: DC

## 2019-11-09 ENCOUNTER — Telehealth: Payer: Self-pay | Admitting: *Deleted

## 2019-11-09 NOTE — Telephone Encounter (Signed)
A message was left, re: his follow up visit. 

## 2019-12-08 ENCOUNTER — Telehealth: Payer: Self-pay | Admitting: Cardiology

## 2019-12-08 NOTE — Telephone Encounter (Signed)
I called patient 12/08/19 to schedule follow up visit from Dr.Jordan's recall list. The patient didn't answer, I left message for the patient to return call so they could be scheduled.

## 2019-12-27 LAB — TSH: TSH: 2.86 (ref <=5.90)

## 2019-12-27 NOTE — Addendum Note (Signed)
Addended by: Wynonia Musty A on: 12/27/2019 11:24 AM   Modules accepted: Orders

## 2020-01-01 ENCOUNTER — Encounter: Payer: Self-pay | Admitting: Family Medicine

## 2020-01-12 NOTE — Telephone Encounter (Addendum)
Dr Lorelei Pont / Mel Almond -- I'm sorry I just now saw this message.  I spoke with Butch Penny at [facility] and she is going to fax the TSH result form 6/16 to the nurses station. It should be there by now.

## 2020-01-17 ENCOUNTER — Encounter: Payer: Self-pay | Admitting: Family Medicine

## 2020-01-19 ENCOUNTER — Encounter: Payer: Self-pay | Admitting: Family Medicine

## 2020-01-22 ENCOUNTER — Telehealth: Payer: Self-pay

## 2020-01-22 DIAGNOSIS — E039 Hypothyroidism, unspecified: Secondary | ICD-10-CM

## 2020-01-22 MED ORDER — LEVOTHYROXINE SODIUM 150 MCG PO TABS: 150.0000 ug | ORAL_TABLET | Freq: Every day | ORAL | 6 refills | Status: DC

## 2020-01-22 NOTE — Telephone Encounter (Signed)
Medication refilled

## 2020-01-22 NOTE — Telephone Encounter (Signed)
Caller states he needs refill for Levothyroxine

## 2020-02-15 ENCOUNTER — Other Ambulatory Visit: Payer: Self-pay | Admitting: Cardiology

## 2020-02-16 NOTE — Telephone Encounter (Signed)
Prescription refill request for Xarelto received.  Indication: Atrial Fibrillation Last office visit: 05/30/2019 [place] Weight:  103 kg Age:86 Scr: 1.75 09/20/2019 CrCl:  38.91 ml/min  Prescription refilled

## 2020-05-23 ENCOUNTER — Other Ambulatory Visit: Payer: Self-pay | Admitting: Family Medicine

## 2020-05-24 ENCOUNTER — Telehealth: Payer: Self-pay | Admitting: Cardiology

## 2020-05-24 ENCOUNTER — Encounter: Payer: Self-pay | Admitting: General Practice

## 2020-05-24 NOTE — Telephone Encounter (Signed)
°  Recall expunge letter sent regarding overdue 6 mo f/u

## 2020-06-18 NOTE — Progress Notes (Addendum)
[place] [facility] [address] [place], [place] [address] [phone]  Date:  06/19/2020   Name:  [name]   DOB:  09-12-1933   MRN:  [id number]  PCP:  [name], MD    Chief Complaint: Hypertension (6 month follow up) and Chronic Kidney Disease   History of Present Illness:  BHARAT ANTILLON is a 86 y.o. very pleasant male patient who presents with the following:  Patient seen today for 26-month follow-up visit-accompanied by his wife Carlus Pavlov who helps with the history.  Communication is always challenging as Vernis is very hard of hearing  Last seen by myself in March of this year- History of hypertension, TIA, CAD, paroxysmal atrial fib, carotid artery occlusion status post endarterectomy, sleep apnea, chronic renal disease, hyperlipidemia, memory loss, hard of hearing, prostate cancer status post radical prostatectomy in 1998, hypothyroidism-also chronic stable angina  Nephrologist is Dr. Joelyn Oms; however, we do not think he has followed up in several years Cardiologist Dr. Arcola Jansky has now been seen in about 3 years, reports that he recently received a letter stating that he would be classified as a new patient if he does not follow-up soon He is a patient at [facility] for recurrent diverticulitis  COVID-19 vaccine- pt refuses Flu shot- pt refused Tetanus vaccine Can do blood work today-most recent creatinine 1.75 in March Colon cancer screening? PSA was 0 in March 2020  Levothyroxine-taking Armour Thyroid Carvedilol losartan Xarelto Aricept Zetia  He has good urinary output  He notes urinary frequency and discomfort- will check his urine today  They note his appetite is less as of the last several months.  At first they report Aarib is only eating "2 eggs and toast "per day over the last several months.  However, on further review he is actually eating 3 meals a day but has reduced appetite  He is not  taking his losartan and coreg for some reason- he thinks they make him dizzy He notes constipation alternating with diarrhea He has noted this for about one year   Wt Readings from Last 3 Encounters:  06/19/20 218 lb (98.9 kg)  09/20/19 227 lb (103 kg)  08/16/19 224 lb (101.6 kg)   BP Readings from Last 3 Encounters:  06/19/20 140/80  09/20/19 130/80  08/16/19 124/70    Patient Active Problem List   Diagnosis Date Noted  . Acute diverticulitis 02/07/2019  . Angina pectoris, variant ([place]) 05/19/2018  . Poor compliance with CPAP treatment 04/20/2018  . Excessive daytime sleepiness 03/22/2018  . Poor sleep hygiene 03/22/2018  . Ventral hernia without obstruction or gangrene 10/09/2017  . TIA (transient ischemic attack)   . Kidney stone   . Hypertension   . Hearing aid worn   . Coronary artery disease   . Asthma due to seasonal allergies   . Arthritis   . Erosive gastropathy 03/26/2017  . Acute blood loss anemia 03/25/2017  . Melena 03/24/2017  . CKD (chronic kidney disease), stage III ([place]) 03/24/2017  . AF (paroxysmal atrial fibrillation) ([place]) 03/24/2017  . Angina pectoris ([place]) 10/09/2015  . Essential hypertension   . Hyperlipemia   . Carotid artery occlusion   . Dizziness 08/09/2015  . Gait instability 05/30/2015  . Hypothyroidism, acquired 10/26/2013  . OSA (obstructive sleep apnea) 10/18/2013  . Benign localized hyperplasia of prostate with urinary obstruction and other lower urinary tract symptoms (LUTS)(600.21) 12/21/2012  . Carcinoma in situ of  prostate 12/21/2012  . Left shoulder pain 08/08/2012  . Obesity 07/24/2011  . Generalized anxiety disorder 07/24/2011  . DIVERTICULOSIS OF COLON 12/25/2009  . DERMATITIS, SEBORRHEIC 12/25/2009  . 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 Memory loss 08/14/2009  . VENOUS INSUFFICIENCY, CHRONIC 09/09/2006  . GASTROESOPHAGEAL REFLUX, NO ESOPHAGITIS 09/09/2006  . HERNIA, HIATAL, NONCONGENITAL 09/09/2006  . INSOMNIA NOS  09/09/2006    Past Medical History:  Diagnosis Date  . Anginal pain ([place])   . Anxiety    " OCCASIONAL"  . Arthritis   . Asthma due to seasonal allergies    uses inhalers prn  . Carotid artery occlusion    Left  . Complication of anesthesia    " had tremors after prostate surgery  . Coronary artery disease   . Diverticulitis    recurrent  . Family history of anesthesia complication    MOTHER   . GERD (gastroesophageal reflux disease)   . H/O hiatal hernia   . Hearing aid worn   . Hyperlipemia   . Hypertension   . Kidney stone    lithotripsy 6195 w complications, req stents, Dr Rosana Hoes  . Prostate CA ([place])   . Shortness of breath   . Thyroid disease   . TIA (transient ischemic attack)   . Tinnitus     Past Surgical History:  Procedure Laterality Date  . CARDIAC CATHETERIZATION  2008   minimal dz, Dr Cathie Olden  . CARDIAC CATHETERIZATION N/A 10/09/2015   Procedure: Left Heart Cath and Coronary Angiography;  Surgeon: Peter M Martinique, MD;  Location: [facility];  Service: Cardiovascular;  Laterality: N/A;  . CARDIAC CATHETERIZATION N/A 10/09/2015   Procedure: Intravascular Pressure Wire/FFR Study;  Surgeon: Peter M Martinique, MD;  Location: [facility];  Service: Cardiovascular;  Laterality: N/A;  . CARDIAC CATHETERIZATION N/A 10/09/2015   Procedure: Coronary Stent Intervention;  Surgeon: Peter M Martinique, MD;  Location: [facility];  Service: Cardiovascular;  Laterality: N/A;  . CAROTID ENDARTERECTOMY  Left   1998  . CHOLECYSTECTOMY    . CORONARY STENT PLACEMENT  10/09/2015   DES to RCA  . ESOPHAGOGASTRODUODENOSCOPY (EGD) WITH PROPOFOL Left 03/26/2017   Procedure: ESOPHAGOGASTRODUODENOSCOPY (EGD) WITH PROPOFOL;  Surgeon: Arta Silence, MD;  Location: [facility];  Service: Endoscopy;  Laterality: Left;  . LEFT HEART CATHETERIZATION WITH CORONARY ANGIOGRAM N/A 09/15/2013   Procedure: LEFT HEART CATHETERIZATION WITH CORONARY ANGIOGRAM;  Surgeon: Peter M Martinique, MD;   [facility];  Service: Cardiovascular;  Laterality: N/A;  . PROSTATECTOMY  1998   radical for prostate cancer    Social History   Tobacco Use  . Smoking status: Former Smoker    Packs/day: 2.50    Years: 30.00    Pack years: 75.00    Types: Cigarettes    Quit date: 08/28/1985    Years since quitting: 34.8  . Smokeless tobacco: Never Used  Substance Use Topics  . Alcohol use: Yes    Alcohol/week: 2.0 standard drinks    Types: 2 Standard drinks or equivalent per week    Comment: 1 glass wine/week (prior 1 glass martini with dinner)  . Drug use: No    Family History  Problem Relation Age of Onset  . Heart disease Mother   . Heart disease Father   . Kidney failure Father   . Heart disease Sister   . Heart attack Sister   . Dementia Sister   . Sjogren's syndrome Child   . Hashimoto's thyroiditis Child   .  CAD Child     Allergies  Allergen Reactions  . Ciprofloxacin Other (See Comments)    Hallucinations or jitteriness  . Codeine Other (See Comments)    Hallucinations   . Flexeril [Cyclobenzaprine] Other (See Comments)    "Made me feel goofy"  . Imdur [Isosorbide Nitrate] Other (See Comments)    Caused headaches  . Methocarbamol Other (See Comments)    "Made me feel goofy"  . Other Other (See Comments)    CANNOT HAVE ANY FOODS WITH SEEDS   . Strawberry Extract Other (See Comments)    CANNOT HAVE ANY FOODS WITH SEEDS  . Sulfa Antibiotics Other (See Comments)    Chills and shaking- "serum sickness"   . Sulfamethoxazole Other (See Comments)    Chills and shaking- "serum sickness"  . Sulfonamide Derivatives Other (See Comments)    Chills and shaking "serum sickness"  . Tomato Other (See Comments)    CANNOT HAVE ANY FOODS WITH SEEDS  . Flagyl [Metronidazole] Rash    Medication list has been reviewed and updated.  Current Outpatient Medications on File Prior to Visit  Medication Sig Dispense Refill  . albuterol (VENTOLIN HFA) 108 (90 Base) MCG/ACT  inhaler Inhale 2 puffs into the lungs every 6 (six) hours as needed for wheezing or shortness of breath. 18 g 5  . ARMOUR THYROID 120 MG tablet TAKE 1 TABLET BY MOUTH ONCE DAILY 90 tablet 1  . carvedilol (COREG) 6.25 MG tablet TAKE 1 TABLET BY MOUTH ONE TO TWO TIMES DAILY 180 tablet 1  . clobetasol (TEMOVATE) 0.05 % external solution Apply 1 application topically daily.   3  . donepezil (ARICEPT) 5 MG tablet Take 1 tablet (5 mg total) by mouth at bedtime. 30 tablet 6  . ezetimibe (ZETIA) 10 MG tablet TAKE 1 TABLET BY MOUTH EVERY DAY 90 tablet 3  . hydrocortisone 2.5 % cream Apply 1 application topically 2 (two) times daily.    Marland Kitchen ipratropium (ATROVENT) 0.03 % nasal spray Place 2 sprays into the nose 4 (four) times daily. 30 mL 5  . losartan (COZAAR) 50 MG tablet TAKE 1 TABLET BY MOUTH EVERY DAY 90 tablet 1  . Naphazoline-Pheniramine (OPCON-A) 0.027-0.315 % SOLN Place 1-2 drops into both eyes 3 (three) times daily as needed (for itching, redness, and/or irritation).    . nitroGLYCERIN (NITROLINGUAL) 0.4 MG/SPRAY spray Place 1 spray under the tongue every 5 (five) minutes x 3 doses as needed for chest pain. 12 g 5  . triamcinolone cream (KENALOG) 0.1 % Apply 1 application topically daily as needed (for rashes).     Alveda Reasons 15 MG TABS tablet TAKE 1 TABLET (15 MG TOTAL) BY MOUTH DAILY WITH SUPPER. 90 tablet 1   No current facility-administered medications on file prior to visit.    Review of Systems:  As per HPI- otherwise negative.   Physical Examination: Vitals:   06/19/20 1012 06/19/20 1029  BP: (!) 172/90 140/80  Pulse: (!) 111 (!) 106  Resp: 20   SpO2: 97%    Vitals:   06/19/20 1012  Weight: 218 lb (98.9 kg)  Height: 5\' 6"  (1.676 m)   Body mass index is 35.19 kg/m. Ideal Body Weight: Weight in (lb) to have BMI = 25: 154.6  GEN: no acute distress. Pt is quite HOH-there is a small amount of wax in both ears but not enough to cause occlusion Wearing hearing aids HEENT:  Atraumatic, Normocephalic.  Bilateral TM wnl, oropharynx normal.  PEERL,EOMI.   Ears and Nose:  No external deformity. CV: RRR, No M/G/R. No JVD. No thrill. No extra heart sounds. PULM: CTA B, no wheezes, crackles, rhonchi. No retractions. No resp. distress. No accessory muscle use. ABD: S, NT, ND, +BS. No rebound. No HSM. EXTR: No c/c/e PSYCH: Normally interactive. Conversant.    Assessment and Plan: Acquired hypothyroidism - Plan: TSH  Stage 3 chronic kidney disease, unspecified whether stage 3a or 3b CKD ([place]) - Plan: Comprehensive metabolic panel  Paroxysmal atrial fibrillation ([place])  Essential hypertension - Plan: CBC, Comprehensive metabolic panel  Hyperlipidemia, unspecified hyperlipidemia type - Plan: Lipid panel  Carcinoma in situ of prostate - Plan: PSA  Elevated glucose - Plan: Comprehensive metabolic panel, Hemoglobin A1c  Urinary frequency - Plan: Urine Culture, POCT urinalysis dipstick  Microhematuria - Plan: Microalbumin / creatinine urine ratio  Toenail deformity - Plan: Ambulatory referral to Podiatry  Patient seen today with a few concerns He has noted urinary frequency as of late.  Urine culture is pending Patient stated that he thinks COVID-19 is not real.  I did not engage with him about this idea, but have recommended COVID-19 and flu vaccines  Blood pressure elevated and tachycardic today.  He has not been taking his beta-blocker or losartan. I did encourage him to take his medications regularly  History of prostate cancer, check PSA today  Stable stage III chronic kidney disease.  Explained to patient that blood pressure control is important to prevent worsening.  We will check his kidney function test today  He also notes concern of chronic alternating diarrhea and constipation He is an [facility] pt ; we have called him and asked that they schedule patient for follow-up This visit occurred during the SARS-CoV-2 public health emergency.  Safety  protocols were in place, including screening questions prior to the visit, additional usage of staff PPE, and extensive cleaning of exam room while observing appropriate contact time as indicated for disinfecting solutions.    Signed Lamar Blinks, MD  Results for orders placed or performed in visit on 06/19/20  POCT urinalysis dipstick  Result Value Ref Range   Color, UA yellow yellow   Clarity, UA cloudy (A) clear   Glucose, UA negative negative mg/dL   Bilirubin, UA small (A) negative   Ketones, POC UA negative negative mg/dL   Spec Grav, UA 1.025 1.010 - 1.025   Blood, UA large (A) negative   pH, UA 5.0 5.0 - 8.0   Protein Ur, POC =30 (A) negative mg/dL   Urobilinogen, UA 0.2 0.2 or 1.0 E.U./dL   Nitrite, UA Negative Negative   Leukocytes, UA Negative Negative    Received labs as below, await urine culture and will contact patient  Results for orders placed or performed in visit on 06/19/20  CBC  Result Value Ref Range   WBC 5.6 4.0 - 10.5 K/uL   RBC 4.21 (L) 4.22 - 5.81 Mil/uL   Platelets 157.0 150 - 400 K/uL   Hemoglobin 13.6 13.0 - 17.0 g/dL   HCT 40.7 39 - 52 %   MCV 96.8 78.0 - 100.0 fl   MCHC 33.5 30.0 - 36.0 g/dL   RDW 17.0 (H) 11.5 - 15.5 %  Comprehensive metabolic panel  Result Value Ref Range   Sodium 140 135 - 145 mEq/L   Potassium 4.3 3.5 - 5.1 mEq/L   Chloride 100 96 - 112 mEq/L   CO2 31 19 - 32 mEq/L   Glucose, Bld 162 (H) 70 - 99 mg/dL   BUN 19  6 - 23 mg/dL   Creatinine, Ser 1.81 (H) 0.40 - 1.50 mg/dL   Total Bilirubin 2.1 (H) 0.2 - 1.2 mg/dL   Alkaline Phosphatase 91 39 - 117 U/L   AST 38 (H) 0 - 37 U/L   ALT 46 0 - 53 U/L   Total Protein 7.4 6.0 - 8.3 g/dL   Albumin 4.3 3.5 - 5.2 g/dL   GFR 33.83 (L) >60.00 mL/min   Calcium 9.7 8.4 - 10.5 mg/dL  Hemoglobin A1c  Result Value Ref Range   Hgb A1c MFr Bld 6.5 4.6 - 6.5 %  Lipid panel  Result Value Ref Range   Cholesterol 155 0 - 200 mg/dL   Triglycerides 257.0 (H) 0 - 149 mg/dL   HDL  32.00 (L) >39.00 mg/dL   VLDL 51.4 (H) 0.0 - 40.0 mg/dL   Total CHOL/HDL Ratio 5    NonHDL 122.58   PSA  Result Value Ref Range   PSA 0.00 (L) 0.10 - 4.00 ng/mL  TSH  Result Value Ref Range   TSH 2.66 0.35 - 4.50 uIU/mL  Microalbumin / creatinine urine ratio  Result Value Ref Range   Microalb, Ur 5.4 (H) 0.0 - 1.9 mg/dL   Creatinine,U 179.0 mg/dL   Microalb Creat Ratio 3.0 0.0 - 30.0 mg/g  LDL cholesterol, direct  Result Value Ref Range   Direct LDL 95.0 mg/dL  POCT urinalysis dipstick  Result Value Ref Range   Color, UA yellow yellow   Clarity, UA cloudy (A) clear   Glucose, UA negative negative mg/dL   Bilirubin, UA small (A) negative   Ketones, POC UA negative negative mg/dL   Spec Grav, UA 1.025 1.010 - 1.025   Blood, UA large (A) negative   pH, UA 5.0 5.0 - 8.0   Protein Ur, POC =30 (A) negative mg/dL   Urobilinogen, UA 0.2 0.2 or 1.0 E.U./dL   Nitrite, UA Negative Negative   Leukocytes, UA Negative Negative

## 2020-06-18 NOTE — Patient Instructions (Addendum)
It was great to see you again today, I will be in touch with your labs soon as possible.  Assuming all is well, please see me in 6 months We are checking for a urinary tract infection  I do recommend that you get a flu shot and covid 19 vaccine.  I do think that a covid 19 infection is likely to be fatal for you  Please start back on your blood pressure medications- losartan and carvedilol  Please consider getting the Shingrix series at your pharmacy if not done already  I will call your GI office (Dr Buccini/ [facility]) and ask them to see you in follow-up   Health Maintenance After Age 47 After age 50, you are at a higher risk for certain long-term diseases and infections as well as injuries from falls. Falls are a major cause of broken bones and head injuries in people who are older than age 57. Getting regular preventive care can help to keep you healthy and well. Preventive care includes getting regular testing and making lifestyle changes as recommended by your health care provider. Talk with your health care provider about:  Which screenings and tests you should have. A screening is a test that checks for a disease when you have no symptoms.  A diet and exercise plan that is right for you. What should I know about screenings and tests to prevent falls? Screening and testing are the best ways to find a health problem early. Early diagnosis and treatment give you the best chance of managing medical conditions that are common after age 40. Certain conditions and lifestyle choices may make you more likely to have a fall. Your health care provider may recommend:  Regular vision checks. Poor vision and conditions such as cataracts can make you more likely to have a fall. If you wear glasses, make sure to get your prescription updated if your vision changes.  Medicine review. Work with your health care provider to regularly review all of the medicines you are taking, including over-the-counter  medicines. Ask your health care provider about any side effects that may make you more likely to have a fall. Tell your health care provider if any medicines that you take make you feel dizzy or sleepy.  Osteoporosis screening. Osteoporosis is a condition that causes the bones to get weaker. This can make the bones weak and cause them to break more easily.  Blood pressure screening. Blood pressure changes and medicines to control blood pressure can make you feel dizzy.  Strength and balance checks. Your health care provider may recommend certain tests to check your strength and balance while standing, walking, or changing positions.  Foot health exam. Foot pain and numbness, as well as not wearing proper footwear, can make you more likely to have a fall.  Depression screening. You may be more likely to have a fall if you have a fear of falling, feel emotionally low, or feel unable to do activities that you used to do.  Alcohol use screening. Using too much alcohol can affect your balance and may make you more likely to have a fall. What actions can I take to lower my risk of falls? General instructions  Talk with your health care provider about your risks for falling. Tell your health care provider if: ? You fall. Be sure to tell your health care provider about all falls, even ones that seem minor. ? You feel dizzy, sleepy, or off-balance.  Take over-the-counter and prescription medicines only as  told by your health care provider. These include any supplements.  Eat a healthy diet and maintain a healthy weight. A healthy diet includes low-fat dairy products, low-fat (lean) meats, and fiber from whole grains, beans, and lots of fruits and vegetables. Home safety  Remove any tripping hazards, such as rugs, cords, and clutter.  Install safety equipment such as grab bars in bathrooms and safety rails on stairs.  Keep rooms and walkways well-lit. Activity   Follow a regular exercise  program to stay fit. This will help you maintain your balance. Ask your health care provider what types of exercise are appropriate for you.  If you need a cane or walker, use it as recommended by your health care provider.  Wear supportive shoes that have nonskid soles. Lifestyle  Do not drink alcohol if your health care provider tells you not to drink.  If you drink alcohol, limit how much you have: ? 0-1 drink a day for women. ? 0-2 drinks a day for men.  Be aware of how much alcohol is in your drink. In the [place] one drink equals one typical bottle of beer (12 oz), one-half glass of wine (5 oz), or one shot of hard liquor (1 oz).  Do not use any products that contain nicotine or tobacco, such as cigarettes and e-cigarettes. If you need help quitting, ask your health care provider. Summary  Having a healthy lifestyle and getting preventive care can help to protect your health and wellness after age 61.  Screening and testing are the best way to find a health problem early and help you avoid having a fall. Early diagnosis and treatment give you the best chance for managing medical conditions that are more common for people who are older than age 33.  Falls are a major cause of broken bones and head injuries in people who are older than age 23. Take precautions to prevent a fall at home.  Work with your health care provider to learn what changes you can make to improve your health and wellness and to prevent falls. This information is not intended to replace advice given to you by your health care provider. Make sure you discuss any questions you have with your health care provider. Document Revised: 10/20/2018 Document Reviewed: 05/12/2017 Elsevier Patient Education  2020 [employer].

## 2020-06-19 ENCOUNTER — Other Ambulatory Visit: Payer: Self-pay

## 2020-06-19 ENCOUNTER — Encounter: Payer: Self-pay | Admitting: Family Medicine

## 2020-06-19 ENCOUNTER — Ambulatory Visit: Payer: Medicare Other | Admitting: Family Medicine

## 2020-06-19 VITALS — BP 140/80 | HR 106 | Resp 20 | Ht 66.0 in | Wt 218.0 lb

## 2020-06-19 DIAGNOSIS — I480 Paroxysmal atrial fibrillation: Secondary | ICD-10-CM | POA: Diagnosis not present

## 2020-06-19 DIAGNOSIS — I10 Essential (primary) hypertension: Secondary | ICD-10-CM

## 2020-06-19 DIAGNOSIS — E039 Hypothyroidism, unspecified: Secondary | ICD-10-CM

## 2020-06-19 DIAGNOSIS — D075 Carcinoma in situ of prostate: Secondary | ICD-10-CM | POA: Diagnosis not present

## 2020-06-19 DIAGNOSIS — R7309 Other abnormal glucose: Secondary | ICD-10-CM

## 2020-06-19 DIAGNOSIS — N183 Chronic kidney disease, stage 3 unspecified: Secondary | ICD-10-CM | POA: Diagnosis not present

## 2020-06-19 DIAGNOSIS — L608 Other nail disorders: Secondary | ICD-10-CM

## 2020-06-19 DIAGNOSIS — R350 Frequency of micturition: Secondary | ICD-10-CM | POA: Diagnosis not present

## 2020-06-19 DIAGNOSIS — R3129 Other microscopic hematuria: Secondary | ICD-10-CM

## 2020-06-19 DIAGNOSIS — E785 Hyperlipidemia, unspecified: Secondary | ICD-10-CM | POA: Diagnosis not present

## 2020-06-19 LAB — POCT URINALYSIS DIP (MANUAL ENTRY)
Glucose, UA: NEGATIVE mg/dL
Ketones, POC UA: NEGATIVE mg/dL
Leukocytes, UA: NEGATIVE
Nitrite, UA: NEGATIVE
Protein Ur, POC: 30 mg/dL — AB
Spec Grav, UA: 1.025 (ref 1.010–1.025)
Urobilinogen, UA: 0.2 E.U./dL
pH, UA: 5 (ref 5.0–8.0)

## 2020-06-19 LAB — CBC
HCT: 40.7 % (ref 39.0–52.0)
Hemoglobin: 13.6 g/dL (ref 13.0–17.0)
MCHC: 33.5 g/dL (ref 30.0–36.0)
MCV: 96.8 fl (ref 78.0–100.0)
Platelets: 157 10*3/uL (ref 150.0–400.0)
RBC: 4.21 Mil/uL — ABNORMAL LOW (ref 4.22–5.81)
RDW: 17 % — ABNORMAL HIGH (ref 11.5–15.5)
WBC: 5.6 10*3/uL (ref 4.0–10.5)

## 2020-06-19 LAB — LIPID PANEL
Cholesterol: 155 mg/dL (ref 0–200)
HDL: 32 mg/dL — ABNORMAL LOW (ref >39.00)
NonHDL: 122.58
Total CHOL/HDL Ratio: 5
Triglycerides: 257 mg/dL — ABNORMAL HIGH (ref 0.0–149.0)
VLDL: 51.4 mg/dL — ABNORMAL HIGH (ref 0.0–40.0)

## 2020-06-19 LAB — MICROALBUMIN / CREATININE URINE RATIO
Creatinine,U: 179 mg/dL
Microalb Creat Ratio: 3 mg/g (ref 0.0–30.0)
Microalb, Ur: 5.4 mg/dL — ABNORMAL HIGH (ref 0.0–1.9)

## 2020-06-19 LAB — COMPREHENSIVE METABOLIC PANEL
ALT: 46 U/L (ref 0–53)
AST: 38 U/L — ABNORMAL HIGH (ref 0–37)
Albumin: 4.3 g/dL (ref 3.5–5.2)
Alkaline Phosphatase: 91 U/L (ref 39–117)
BUN: 19 mg/dL (ref 6–23)
CO2: 31 mEq/L (ref 19–32)
Calcium: 9.7 mg/dL (ref 8.4–10.5)
Chloride: 100 mEq/L (ref 96–112)
Creatinine, Ser: 1.81 mg/dL — ABNORMAL HIGH (ref 0.40–1.50)
GFR: 33.83 mL/min — ABNORMAL LOW (ref >60.00)
Glucose, Bld: 162 mg/dL — ABNORMAL HIGH (ref 70–99)
Potassium: 4.3 mEq/L (ref 3.5–5.1)
Sodium: 140 mEq/L (ref 135–145)
Total Bilirubin: 2.1 mg/dL — ABNORMAL HIGH (ref 0.2–1.2)
Total Protein: 7.4 g/dL (ref 6.0–8.3)

## 2020-06-19 LAB — HEMOGLOBIN A1C: Hgb A1c MFr Bld: 6.5 % (ref 4.6–6.5)

## 2020-06-19 LAB — TSH: TSH: 2.66 u[IU]/mL (ref 0.35–4.50)

## 2020-06-19 LAB — LDL CHOLESTEROL, DIRECT: Direct LDL: 95 mg/dL

## 2020-06-19 LAB — PSA: PSA: 0 ng/mL — ABNORMAL LOW (ref 0.10–4.00)

## 2020-06-20 LAB — URINE CULTURE
MICRO NUMBER:: 11292300
Result:: NO GROWTH
SPECIMEN QUALITY:: ADEQUATE

## 2020-06-23 ENCOUNTER — Encounter: Payer: Self-pay | Admitting: Family Medicine

## 2020-07-02 LAB — PSA: PSA: 0.015

## 2020-07-23 ENCOUNTER — Encounter: Payer: Self-pay | Admitting: Family Medicine

## 2020-07-25 ENCOUNTER — Other Ambulatory Visit: Payer: Self-pay

## 2020-07-25 ENCOUNTER — Encounter (HOSPITAL_BASED_OUTPATIENT_CLINIC_OR_DEPARTMENT_OTHER): Payer: Self-pay

## 2020-07-25 ENCOUNTER — Emergency Department (HOSPITAL_BASED_OUTPATIENT_CLINIC_OR_DEPARTMENT_OTHER)
Admission: EM | Admit: 2020-07-25 | Discharge: 2020-07-25 | Disposition: A | Discharge status: Home / Self Care | Payer: Medicare Other | Attending: Emergency Medicine | Admitting: Emergency Medicine

## 2020-07-25 DIAGNOSIS — R041 Hemorrhage from throat: Secondary | ICD-10-CM | POA: Diagnosis present

## 2020-07-25 DIAGNOSIS — Z7901 Long term (current) use of anticoagulants: Secondary | ICD-10-CM | POA: Insufficient documentation

## 2020-07-25 DIAGNOSIS — Z5321 Procedure and treatment not carried out due to patient leaving prior to being seen by health care provider: Secondary | ICD-10-CM | POA: Diagnosis not present

## 2020-07-25 LAB — BASIC METABOLIC PANEL
Anion gap: 10 (ref 5–15)
BUN: 19 mg/dL (ref 8–23)
CO2: 23 mmol/L (ref 22–32)
Calcium: 9.2 mg/dL (ref 8.9–10.3)
Chloride: 103 mmol/L (ref 98–111)
Creatinine, Ser: 1.6 mg/dL — ABNORMAL HIGH (ref 0.61–1.24)
GFR, Estimated: 42 mL/min — ABNORMAL LOW (ref >60)
Glucose, Bld: 172 mg/dL — ABNORMAL HIGH (ref 70–99)
Potassium: 4.2 mmol/L (ref 3.5–5.1)
Sodium: 136 mmol/L (ref 135–145)

## 2020-07-25 LAB — CBC
HCT: 38.9 % — ABNORMAL LOW (ref 39.0–52.0)
Hemoglobin: 13.5 g/dL (ref 13.0–17.0)
MCH: 33.6 pg (ref 26.0–34.0)
MCHC: 34.7 g/dL (ref 30.0–36.0)
MCV: 96.8 fL (ref 80.0–100.0)
Platelets: 141 10*3/uL — ABNORMAL LOW (ref 150–400)
RBC: 4.02 MIL/uL — ABNORMAL LOW (ref 4.22–5.81)
RDW: 15.3 % (ref 11.5–15.5)
WBC: 5.4 10*3/uL (ref 4.0–10.5)
nRBC: 0 % (ref 0.0–0.2)

## 2020-07-25 NOTE — ED Triage Notes (Signed)
Pt reports having mouth bleeding today. Pt is on xarelto. Pt reports that this has happened previously from take a double dose of xarelto on accident.  Pcp [facility] instructed him to come in to be evaluated. Pt in NAD while in triage.

## 2020-09-08 ENCOUNTER — Emergency Department (HOSPITAL_COMMUNITY): Payer: Medicare Other

## 2020-09-08 ENCOUNTER — Other Ambulatory Visit: Payer: Self-pay

## 2020-09-08 ENCOUNTER — Observation Stay (HOSPITAL_COMMUNITY)
Admission: EM | Admit: 2020-09-08 | Discharge: 2020-09-10 | Disposition: A | Discharge status: Home / Self Care | Payer: Medicare Other | Attending: Internal Medicine | Admitting: Internal Medicine

## 2020-09-08 ENCOUNTER — Encounter (HOSPITAL_COMMUNITY): Payer: Self-pay

## 2020-09-08 DIAGNOSIS — Z8673 Personal history of transient ischemic attack (TIA), and cerebral infarction without residual deficits: Secondary | ICD-10-CM | POA: Diagnosis not present

## 2020-09-08 DIAGNOSIS — Z8546 Personal history of malignant neoplasm of prostate: Secondary | ICD-10-CM | POA: Insufficient documentation

## 2020-09-08 DIAGNOSIS — Z20822 Contact with and (suspected) exposure to covid-19: Secondary | ICD-10-CM | POA: Diagnosis not present

## 2020-09-08 DIAGNOSIS — E6609 Other obesity due to excess calories: Secondary | ICD-10-CM

## 2020-09-08 DIAGNOSIS — R0789 Other chest pain: Secondary | ICD-10-CM | POA: Diagnosis present

## 2020-09-08 DIAGNOSIS — K219 Gastro-esophageal reflux disease without esophagitis: Secondary | ICD-10-CM

## 2020-09-08 DIAGNOSIS — Z79899 Other long term (current) drug therapy: Secondary | ICD-10-CM | POA: Insufficient documentation

## 2020-09-08 DIAGNOSIS — I10 Essential (primary) hypertension: Secondary | ICD-10-CM | POA: Diagnosis present

## 2020-09-08 DIAGNOSIS — R1011 Right upper quadrant pain: Secondary | ICD-10-CM

## 2020-09-08 DIAGNOSIS — R072 Precordial pain: Secondary | ICD-10-CM

## 2020-09-08 DIAGNOSIS — J45909 Unspecified asthma, uncomplicated: Secondary | ICD-10-CM | POA: Insufficient documentation

## 2020-09-08 DIAGNOSIS — N1832 Chronic kidney disease, stage 3b: Secondary | ICD-10-CM | POA: Diagnosis not present

## 2020-09-08 DIAGNOSIS — I2510 Atherosclerotic heart disease of native coronary artery without angina pectoris: Secondary | ICD-10-CM | POA: Diagnosis not present

## 2020-09-08 DIAGNOSIS — I480 Paroxysmal atrial fibrillation: Secondary | ICD-10-CM | POA: Diagnosis present

## 2020-09-08 DIAGNOSIS — Z7901 Long term (current) use of anticoagulants: Secondary | ICD-10-CM | POA: Insufficient documentation

## 2020-09-08 DIAGNOSIS — E1122 Type 2 diabetes mellitus with diabetic chronic kidney disease: Secondary | ICD-10-CM | POA: Diagnosis not present

## 2020-09-08 DIAGNOSIS — N189 Chronic kidney disease, unspecified: Secondary | ICD-10-CM

## 2020-09-08 DIAGNOSIS — E119 Type 2 diabetes mellitus without complications: Secondary | ICD-10-CM | POA: Diagnosis present

## 2020-09-08 DIAGNOSIS — R778 Other specified abnormalities of plasma proteins: Secondary | ICD-10-CM

## 2020-09-08 DIAGNOSIS — Z6834 Body mass index (BMI) 34.0-34.9, adult: Secondary | ICD-10-CM

## 2020-09-08 DIAGNOSIS — Z87891 Personal history of nicotine dependence: Secondary | ICD-10-CM | POA: Insufficient documentation

## 2020-09-08 DIAGNOSIS — R079 Chest pain, unspecified: Secondary | ICD-10-CM | POA: Diagnosis present

## 2020-09-08 DIAGNOSIS — R739 Hyperglycemia, unspecified: Secondary | ICD-10-CM

## 2020-09-08 DIAGNOSIS — E039 Hypothyroidism, unspecified: Secondary | ICD-10-CM | POA: Diagnosis not present

## 2020-09-08 DIAGNOSIS — E1165 Type 2 diabetes mellitus with hyperglycemia: Secondary | ICD-10-CM | POA: Diagnosis present

## 2020-09-08 LAB — CBC WITH DIFFERENTIAL/PLATELET
Abs Immature Granulocytes: 0.15 10*3/uL — ABNORMAL HIGH (ref 0.00–0.07)
Basophils Absolute: 0.1 10*3/uL (ref 0.0–0.1)
Basophils Relative: 2 %
Eosinophils Absolute: 0.5 10*3/uL (ref 0.0–0.5)
Eosinophils Relative: 11 %
HCT: 40.5 % (ref 39.0–52.0)
Hemoglobin: 14.4 g/dL (ref 13.0–17.0)
Immature Granulocytes: 4 %
Lymphocytes Relative: 23 %
Lymphs Abs: 1 10*3/uL (ref 0.7–4.0)
MCH: 35.2 pg — ABNORMAL HIGH (ref 26.0–34.0)
MCHC: 35.6 g/dL (ref 30.0–36.0)
MCV: 99 fL (ref 80.0–100.0)
Monocytes Absolute: 0.6 10*3/uL (ref 0.1–1.0)
Monocytes Relative: 15 %
Neutro Abs: 2 10*3/uL (ref 1.7–7.7)
Neutrophils Relative %: 45 %
Platelets: 103 10*3/uL — ABNORMAL LOW (ref 150–400)
RBC: 4.09 MIL/uL — ABNORMAL LOW (ref 4.22–5.81)
RDW: 14.8 % (ref 11.5–15.5)
WBC: 4.3 10*3/uL (ref 4.0–10.5)
nRBC: 0 % (ref 0.0–0.2)

## 2020-09-08 LAB — COMPREHENSIVE METABOLIC PANEL
ALT: 74 U/L — ABNORMAL HIGH (ref 0–44)
AST: 65 U/L — ABNORMAL HIGH (ref 15–41)
Albumin: 3.6 g/dL (ref 3.5–5.0)
Alkaline Phosphatase: 113 U/L (ref 38–126)
Anion gap: 13 (ref 5–15)
BUN: 17 mg/dL (ref 8–23)
CO2: 21 mmol/L — ABNORMAL LOW (ref 22–32)
Calcium: 9.3 mg/dL (ref 8.9–10.3)
Chloride: 97 mmol/L — ABNORMAL LOW (ref 98–111)
Creatinine, Ser: 1.65 mg/dL — ABNORMAL HIGH (ref 0.61–1.24)
GFR, Estimated: 40 mL/min — ABNORMAL LOW (ref >60)
Glucose, Bld: 370 mg/dL — ABNORMAL HIGH (ref 70–99)
Potassium: 4.3 mmol/L (ref 3.5–5.1)
Sodium: 131 mmol/L — ABNORMAL LOW (ref 135–145)
Total Bilirubin: 2 mg/dL — ABNORMAL HIGH (ref 0.3–1.2)
Total Protein: 6.9 g/dL (ref 6.5–8.1)

## 2020-09-08 LAB — TROPONIN I (HIGH SENSITIVITY)
Troponin I (High Sensitivity): 22 ng/L — ABNORMAL HIGH (ref <18)
Troponin I (High Sensitivity): 28 ng/L — ABNORMAL HIGH (ref <18)

## 2020-09-08 NOTE — ED Notes (Signed)
POC discussed with pt and wife. Ptd denies any chest pain or discomfort. Repositioned in bed. Will continue to monitor.

## 2020-09-08 NOTE — ED Provider Notes (Signed)
[place] EMERGENCY DEPARTMENT Provider Note   CSN: [id number] Arrival date & time: 09/08/20  1808     History Chief Complaint  Patient presents with  . Chest Pain    [name] is a 87 y.o. male.  Patient with history of paroxysmal atrial fibrillation on anticoagulation, history of coronary artery disease status post stenting --presents to the emergency department today for evaluation of chest pain that started approximately 2 hours prior to ED arrival.  Patient states that he was watching TV when he developed an aching pain in his mid chest.  Pain went to right arm per EMS.  No associated nausea or vomiting.  Patient gets similar episodes of pain that typically go away, however this was more persistent.  No associated shortness of breath, cough or fevers.  Patient took a baby aspirin and 1 spray of nitroglycerin at home which did not change his pain.  EMS was called by the patient's wife who transported him to the hospital.  Pain currently resolved. No sick contacts. Patient denies risk factors for pulmonary embolism including: unilateral leg swelling, history of DVT/PE/other blood clots, use of exogenous hormones, recent immobilizations, recent surgery, recent travel (>4hr segment), malignancy, hemoptysis. Last cath 2017.          Past Medical History:  Diagnosis Date  . Anginal pain ([place])   . Anxiety    " OCCASIONAL"  . Arthritis   . Asthma due to seasonal allergies    uses inhalers prn  . Carotid artery occlusion    Left  . Complication of anesthesia    " had tremors after prostate surgery  . Coronary artery disease   . Diverticulitis    recurrent  . Family history of anesthesia complication    MOTHER   . GERD (gastroesophageal reflux disease)   . H/O hiatal hernia   . Hearing aid worn   . Hyperlipemia   . Hypertension   . Kidney stone    lithotripsy 3559 w complications, req stents, Dr Rosana Hoes  . Prostate CA ([place])   . Shortness of breath   .  Thyroid disease   . TIA (transient ischemic attack)   . Tinnitus     Patient Active Problem List   Diagnosis Date Noted  . Acute diverticulitis 02/07/2019  . Angina pectoris, variant ([place]) 05/19/2018  . Poor compliance with CPAP treatment 04/20/2018  . Excessive daytime sleepiness 03/22/2018  . Poor sleep hygiene 03/22/2018  . Ventral hernia without obstruction or gangrene 10/09/2017  . TIA (transient ischemic attack)   . Kidney stone   . Hypertension   . Hearing aid worn   . Coronary artery disease   . Asthma due to seasonal allergies   . Arthritis   . Erosive gastropathy 03/26/2017  . Acute blood loss anemia 03/25/2017  . Melena 03/24/2017  . CKD (chronic kidney disease), stage III ([place]) 03/24/2017  . AF (paroxysmal atrial fibrillation) ([place]) 03/24/2017  . Angina pectoris ([place]) 10/09/2015  . Essential hypertension   . Hyperlipemia   . Carotid artery occlusion   . Dizziness 08/09/2015  . Gait instability 05/30/2015  . Hypothyroidism, acquired 10/26/2013  . OSA (obstructive sleep apnea) 10/18/2013  . Benign localized hyperplasia of prostate with urinary obstruction and other lower urinary tract symptoms (LUTS)(600.21) 12/21/2012  . Carcinoma in situ of prostate 12/21/2012  . Left shoulder pain 08/08/2012  . Obesity 07/24/2011  . Generalized anxiety disorder 07/24/2011  . DIVERTICULOSIS OF COLON 12/25/2009  . DERMATITIS, SEBORRHEIC 12/25/2009  .  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 Memory loss 08/14/2009  . VENOUS INSUFFICIENCY, CHRONIC 09/09/2006  . GASTROESOPHAGEAL REFLUX, NO ESOPHAGITIS 09/09/2006  . HERNIA, HIATAL, NONCONGENITAL 09/09/2006  . INSOMNIA NOS 09/09/2006    Past Surgical History:  Procedure Laterality Date  . CARDIAC CATHETERIZATION  2008   minimal dz, Dr Cathie Olden  . CARDIAC CATHETERIZATION N/A 10/09/2015   Procedure: Left Heart Cath and Coronary Angiography;  Surgeon: Peter M Martinique, MD;  Location: [facility];  Service: Cardiovascular;   Laterality: N/A;  . CARDIAC CATHETERIZATION N/A 10/09/2015   Procedure: Intravascular Pressure Wire/FFR Study;  Surgeon: Peter M Martinique, MD;  Location: [facility];  Service: Cardiovascular;  Laterality: N/A;  . CARDIAC CATHETERIZATION N/A 10/09/2015   Procedure: Coronary Stent Intervention;  Surgeon: Peter M Martinique, MD;  Location: [facility];  Service: Cardiovascular;  Laterality: N/A;  . CAROTID ENDARTERECTOMY  Left   1998  . CHOLECYSTECTOMY    . CORONARY STENT PLACEMENT  10/09/2015   DES to RCA  . ESOPHAGOGASTRODUODENOSCOPY (EGD) WITH PROPOFOL Left 03/26/2017   Procedure: ESOPHAGOGASTRODUODENOSCOPY (EGD) WITH PROPOFOL;  Surgeon: Arta Silence, MD;  Location: [facility];  Service: Endoscopy;  Laterality: Left;  . LEFT HEART CATHETERIZATION WITH CORONARY ANGIOGRAM N/A 09/15/2013   Procedure: LEFT HEART CATHETERIZATION WITH CORONARY ANGIOGRAM;  Surgeon: Peter M Martinique, MD;  [facility];  Service: Cardiovascular;  Laterality: N/A;  . PROSTATECTOMY  1998   radical for prostate cancer       Family History  Problem Relation Age of Onset  . Heart disease Mother   . Heart disease Father   . Kidney failure Father   . Heart disease Sister   . Heart attack Sister   . Dementia Sister   . Sjogren's syndrome Child   . Hashimoto's thyroiditis Child   . CAD Child     Social History   Tobacco Use  . Smoking status: Former Smoker    Packs/day: 2.50    Years: 30.00    Pack years: 75.00    Types: Cigarettes    Quit date: 08/28/1985    Years since quitting: 35.0  . Smokeless tobacco: Never Used  Substance Use Topics  . Alcohol use: Yes    Alcohol/week: 2.0 standard drinks    Types: 2 Standard drinks or equivalent per week    Comment: 1 glass wine/week (prior 1 glass martini with dinner)  . Drug use: No    Home Medications Prior to Admission medications   Medication Sig Start Date End Date Taking? Authorizing Provider  albuterol (VENTOLIN HFA) 108 (90 Base)  MCG/ACT inhaler Inhale 2 puffs into the lungs every 6 (six) hours as needed for wheezing or shortness of breath. 08/18/17   Copland, Gay Filler, MD  ARMOUR THYROID 120 MG tablet TAKE 1 TABLET BY MOUTH ONCE DAILY 05/23/20   Copland, Gay Filler, MD  carvedilol (COREG) 6.25 MG tablet TAKE 1 TABLET BY MOUTH ONE TO TWO TIMES DAILY 10/03/19   Copland, Gay Filler, MD  clobetasol (TEMOVATE) 0.05 % external solution Apply 1 application topically daily.  10/06/17   [provider]  donepezil (ARICEPT) 5 MG tablet Take 1 tablet (5 mg total) by mouth at bedtime. 09/20/19   Copland, Gay Filler, MD  ezetimibe (ZETIA) 10 MG tablet TAKE 1 TABLET BY MOUTH EVERY DAY 07/21/19   Martinique, Peter M, MD  hydrocortisone 2.5 % cream Apply 1 application topically 2 (two) times daily.    [provider]  ipratropium (  ATROVENT) 0.03 % nasal spray Place 2 sprays into the nose 4 (four) times daily. 03/03/19   Copland, Gay Filler, MD  losartan (COZAAR) 50 MG tablet TAKE 1 TABLET BY MOUTH EVERY DAY 10/03/19   Martinique, Peter M, MD  Naphazoline-Pheniramine (OPCON-A) 0.027-0.315 % SOLN Place 1-2 drops into both eyes 3 (three) times daily as needed (for itching, redness, and/or irritation).    [provider]  nitroGLYCERIN (NITROLINGUAL) 0.4 MG/SPRAY spray Place 1 spray under the tongue every 5 (five) minutes x 3 doses as needed for chest pain. 07/18/18   Martinique, Peter M, MD  triamcinolone cream (KENALOG) 0.1 % Apply 1 application topically daily as needed (for rashes).     [provider]  XARELTO 15 MG TABS tablet TAKE 1 TABLET (15 MG TOTAL) BY MOUTH DAILY WITH SUPPER. 02/16/20   Martinique, Peter M, MD    Allergies    Ciprofloxacin, Codeine, Flexeril [cyclobenzaprine], Imdur [isosorbide nitrate], Methocarbamol, Other, Strawberry extract, Sulfa antibiotics, Sulfamethoxazole, Sulfonamide derivatives, Tomato, and Flagyl [metronidazole]  Review of Systems   Review of Systems  Constitutional: Negative for diaphoresis and  fever.  Eyes: Negative for redness.  Respiratory: Positive for cough (occasional). Negative for shortness of breath.   Cardiovascular: Positive for chest pain. Negative for palpitations and leg swelling.  Gastrointestinal: Negative for abdominal pain, nausea and vomiting.  Genitourinary: Negative for dysuria.  Musculoskeletal: Negative for back pain and neck pain.  Skin: Negative for rash.  Neurological: Negative for syncope and light-headedness.  Psychiatric/Behavioral: The patient is not nervous/anxious.     Physical Exam Updated Vital Signs BP (!) 153/89   Pulse 89   Temp 98 F (36.7 C) (Oral)   Resp 16   Ht 5\' 6"  (1.676 m)   Wt 97.5 kg   SpO2 97%   BMI 34.70 kg/m   Physical Exam Vitals and nursing note reviewed.  Constitutional:      Appearance: He is well-developed and well-nourished.  HENT:     Head: Normocephalic and atraumatic.  Eyes:     Conjunctiva/sclera: Conjunctivae normal.  Cardiovascular:     Rate and Rhythm: Normal rate and regular rhythm.     Heart sounds: Murmur heard.   Systolic murmur is present.     Comments: Systolic murmur heard best the left upper sternal border Pulmonary:     Effort: No respiratory distress.  Abdominal:     Comments: No abdominal tenderness to palpation.  Musculoskeletal:     Cervical back: Normal range of motion and neck supple.     Right lower leg: No tenderness. No edema.     Left lower leg: No tenderness. No edema.  Skin:    General: Skin is warm and dry.  Neurological:     Mental Status: He is alert.  Psychiatric:        Mood and Affect: Mood and affect normal.     ED Results / Procedures / Treatments   Labs (all labs ordered are listed, but only abnormal results are displayed) Labs Reviewed  CBC WITH DIFFERENTIAL/PLATELET - Abnormal; Notable for the following components:      Result Value   RBC 4.09 (*)    MCH 35.2 (*)    Platelets 103 (*)    Abs Immature Granulocytes 0.15 (*)    All other components  within normal limits  COMPREHENSIVE METABOLIC PANEL - Abnormal; Notable for the following components:   Sodium 131 (*)    Chloride 97 (*)    CO2 21 (*)  Glucose, Bld 370 (*)    Creatinine, Ser 1.65 (*)    AST 65 (*)    ALT 74 (*)    Total Bilirubin 2.0 (*)    GFR, Estimated 40 (*)    All other components within normal limits  TROPONIN I (HIGH SENSITIVITY) - Abnormal; Notable for the following components:   Troponin I (High Sensitivity) 22 (*)    All other components within normal limits  TROPONIN I (HIGH SENSITIVITY) - Abnormal; Notable for the following components:   Troponin I (High Sensitivity) 28 (*)    All other components within normal limits  RESP PANEL BY RT-PCR (FLU A&B, COVID) [id number]    EKG EKG Interpretation  Date/Time:  Sunday September 08 2020 18:16:00 EST Ventricular Rate:  91 PR Interval:    QRS Duration: 127 QT Interval:  378 QTC Calculation: 466 R Axis:   50 Text Interpretation: Sinus rhythm IVCD, consider atypical RBBB No significant change since last tracing Confirmed by Deno Etienne [phone]) on 09/08/2020 6:29:22 PM   Radiology DG Chest 2 View  Result Date: 09/08/2020 CLINICAL DATA:  Chest pain. EXAM: CHEST - 2 VIEW COMPARISON:  February 10, 2019. FINDINGS: Stable cardiomediastinal silhouette. No pneumothorax or pleural effusion is noted. No acute pulmonary disease is noted. Bony thorax is unremarkable. IMPRESSION: No active cardiopulmonary disease. Aortic Atherosclerosis (ICD10-I70.0). Electronically Signed   By: Marijo Conception M.D.   On: 09/08/2020 19:36   US Abdomen Limited RUQ (LIVER/GB)  Result Date: 09/08/2020 CLINICAL DATA:  Pain EXAM: ULTRASOUND ABDOMEN LIMITED RIGHT UPPER QUADRANT COMPARISON:  02/23/2019 FINDINGS: Gallbladder: Surgically absent Common bile duct: Diameter: Not well visualized Liver: The liver parenchyma is markedly heterogeneous with increased echogenicity. There is suggestion of small nodules throughout the liver parenchyma. Portal vein  is patent on color Doppler imaging with normal direction of blood flow towards the liver. Other: None. IMPRESSION: 1. The patient is status post prior cholecystectomy. 2. Markedly heterogeneous hepatic parenchyma suggestive of underlying hepatocellular disease or hepatic steatosis. Tiny nodules are noted throughout the hepatic parenchyma which are nonspecific but may represent regenerative nodules. This can be further evaluated with a contrast enhanced MRI or CT as clinically indicated. Electronically Signed   By: Constance Holster M.D.   On: 09/08/2020 22:14    Procedures Procedures   Medications Ordered in ED Medications - No data to display  ED Course  I have reviewed the triage vital signs and the nursing notes.  Pertinent labs & imaging results that were available during my care of the patient were reviewed by me and considered in my medical decision making (see chart for details).  Patient seen and examined. Work-up initiated. ASA given by EMS. EKG reviewed. Discussed with Dr. Tyrone Nine who will see.   Vital signs reviewed and are as follows: BP (!) 153/89   Pulse 89   Temp 98 F (36.7 C) (Oral)   Resp 16   Ht 5\' 6"  (1.676 m)   Wt 97.5 kg   SpO2 97%   BMI 34.70 kg/m   First trop 22. Second due now. Pt had RUQ pain for Dr. Tyrone Nine. H/o cholecystectomy. Mildly elevated liver enzymes and bili. Will obtain RUQ [place].   10:47 PM Troponin 22 > 28. Discussed with patient and wife. Agreeable to admission. Will discuss with hospitalist.   11:03 PM Spoke with Dr. Cyd Silence who will see patient.    MDM Rules/Calculators/A&P  Admit for chest pain obs.    Final Clinical Impression(s) / ED Diagnoses Final diagnoses:  RUQ pain  Precordial pain  Elevated troponin  Chronic kidney disease, unspecified CKD stage  Hyperglycemia    Rx / DC Orders ED Discharge Orders    None       Carlisle Cater, PA-C 09/08/20 Emily, [place], DO 09/08/20 [id number]

## 2020-09-08 NOTE — ED Triage Notes (Signed)
Pt BIB [facility] d/t CP that started 2 hrs prior to arrival that was not alleviated with one spray of Nitro and a baby ASA at home. Upon EMS arrival pt was c/o central CP that radiated to the Right arm. 190/100, 90 bpm, 97.7 T,  CBG 420, Hx of CKD, does have rt flank pain at this time. Dry cough present for the past few weeks, pt denies sick contact with covid, not vaccinated. CP resolved upon arrival to ED.

## 2020-09-09 ENCOUNTER — Observation Stay (HOSPITAL_BASED_OUTPATIENT_CLINIC_OR_DEPARTMENT_OTHER): Payer: Medicare Other

## 2020-09-09 DIAGNOSIS — R079 Chest pain, unspecified: Secondary | ICD-10-CM

## 2020-09-09 DIAGNOSIS — E6609 Other obesity due to excess calories: Secondary | ICD-10-CM

## 2020-09-09 DIAGNOSIS — N1832 Chronic kidney disease, stage 3b: Secondary | ICD-10-CM | POA: Diagnosis not present

## 2020-09-09 DIAGNOSIS — E119 Type 2 diabetes mellitus without complications: Secondary | ICD-10-CM | POA: Diagnosis present

## 2020-09-09 DIAGNOSIS — I480 Paroxysmal atrial fibrillation: Secondary | ICD-10-CM | POA: Diagnosis not present

## 2020-09-09 DIAGNOSIS — I361 Nonrheumatic tricuspid (valve) insufficiency: Secondary | ICD-10-CM

## 2020-09-09 DIAGNOSIS — Z6834 Body mass index (BMI) 34.0-34.9, adult: Secondary | ICD-10-CM

## 2020-09-09 DIAGNOSIS — E039 Hypothyroidism, unspecified: Secondary | ICD-10-CM

## 2020-09-09 DIAGNOSIS — I340 Nonrheumatic mitral (valve) insufficiency: Secondary | ICD-10-CM | POA: Diagnosis not present

## 2020-09-09 DIAGNOSIS — I350 Nonrheumatic aortic (valve) stenosis: Secondary | ICD-10-CM

## 2020-09-09 DIAGNOSIS — K219 Gastro-esophageal reflux disease without esophagitis: Secondary | ICD-10-CM

## 2020-09-09 DIAGNOSIS — I10 Essential (primary) hypertension: Secondary | ICD-10-CM

## 2020-09-09 DIAGNOSIS — E1165 Type 2 diabetes mellitus with hyperglycemia: Secondary | ICD-10-CM

## 2020-09-09 LAB — CBC WITH DIFFERENTIAL/PLATELET
Abs Immature Granulocytes: 0.18 10*3/uL — ABNORMAL HIGH (ref 0.00–0.07)
Basophils Absolute: 0.1 10*3/uL (ref 0.0–0.1)
Basophils Relative: 1 %
Eosinophils Absolute: 0.6 10*3/uL — ABNORMAL HIGH (ref 0.0–0.5)
Eosinophils Relative: 11 %
HCT: 38.5 % — ABNORMAL LOW (ref 39.0–52.0)
Hemoglobin: 13.3 g/dL (ref 13.0–17.0)
Immature Granulocytes: 4 %
Lymphocytes Relative: 21 %
Lymphs Abs: 1.1 10*3/uL (ref 0.7–4.0)
MCH: 34.2 pg — ABNORMAL HIGH (ref 26.0–34.0)
MCHC: 34.5 g/dL (ref 30.0–36.0)
MCV: 99 fL (ref 80.0–100.0)
Monocytes Absolute: 0.7 10*3/uL (ref 0.1–1.0)
Monocytes Relative: 14 %
Neutro Abs: 2.6 10*3/uL (ref 1.7–7.7)
Neutrophils Relative %: 49 %
Platelets: 111 10*3/uL — ABNORMAL LOW (ref 150–400)
RBC: 3.89 MIL/uL — ABNORMAL LOW (ref 4.22–5.81)
RDW: 14.6 % (ref 11.5–15.5)
WBC: 5.1 10*3/uL (ref 4.0–10.5)
nRBC: 0 % (ref 0.0–0.2)

## 2020-09-09 LAB — RESP PANEL BY RT-PCR (FLU A&B, COVID) ARPGX2
Influenza A by PCR: NEGATIVE
Influenza B by PCR: NEGATIVE
SARS Coronavirus 2 by RT PCR: NEGATIVE

## 2020-09-09 LAB — COMPREHENSIVE METABOLIC PANEL
ALT: 66 U/L — ABNORMAL HIGH (ref 0–44)
AST: 52 U/L — ABNORMAL HIGH (ref 15–41)
Albumin: 3.3 g/dL — ABNORMAL LOW (ref 3.5–5.0)
Alkaline Phosphatase: 92 U/L (ref 38–126)
Anion gap: 11 (ref 5–15)
BUN: 15 mg/dL (ref 8–23)
CO2: 27 mmol/L (ref 22–32)
Calcium: 9.3 mg/dL (ref 8.9–10.3)
Chloride: 99 mmol/L (ref 98–111)
Creatinine, Ser: 1.66 mg/dL — ABNORMAL HIGH (ref 0.61–1.24)
GFR, Estimated: 40 mL/min — ABNORMAL LOW (ref >60)
Glucose, Bld: 252 mg/dL — ABNORMAL HIGH (ref 70–99)
Potassium: 3.8 mmol/L (ref 3.5–5.1)
Sodium: 137 mmol/L (ref 135–145)
Total Bilirubin: 1.9 mg/dL — ABNORMAL HIGH (ref 0.3–1.2)
Total Protein: 6.3 g/dL — ABNORMAL LOW (ref 6.5–8.1)

## 2020-09-09 LAB — HEMOGLOBIN A1C
Hgb A1c MFr Bld: 8.3 % — ABNORMAL HIGH (ref 4.8–5.6)
Mean Plasma Glucose: 191.51 mg/dL

## 2020-09-09 LAB — GLUCOSE, CAPILLARY
Glucose-Capillary: 243 mg/dL — ABNORMAL HIGH (ref 70–99)
Glucose-Capillary: 206 mg/dL — ABNORMAL HIGH (ref 70–99)
Glucose-Capillary: 184 mg/dL — ABNORMAL HIGH (ref 70–99)
Glucose-Capillary: 180 mg/dL — ABNORMAL HIGH (ref 70–99)
Glucose-Capillary: 178 mg/dL — ABNORMAL HIGH (ref 70–99)

## 2020-09-09 LAB — TROPONIN I (HIGH SENSITIVITY): Troponin I (High Sensitivity): 65 ng/L — ABNORMAL HIGH (ref <18)

## 2020-09-09 LAB — MAGNESIUM: Magnesium: 1.8 mg/dL (ref 1.7–2.4)

## 2020-09-09 LAB — BRAIN NATRIURETIC PEPTIDE: B Natriuretic Peptide: 117.3 pg/mL — ABNORMAL HIGH (ref 0.0–100.0)

## 2020-09-09 LAB — D-DIMER, QUANTITATIVE: D-Dimer, Quant: 1.13 ug/mL-FEU — ABNORMAL HIGH (ref 0.00–0.50)

## 2020-09-09 MED ORDER — CARVEDILOL 6.25 MG PO TABS
6.2500 mg | ORAL_TABLET | Freq: Two times a day (BID) | ORAL | Status: DC
  Administered 2020-09-09 – 2020-09-10 (×3): 6.25 mg via ORAL
  Filled 2020-09-09 (×3): qty 1

## 2020-09-09 MED ORDER — DONEPEZIL HCL 5 MG PO TABS
5.0000 mg | ORAL_TABLET | Freq: Every day | ORAL | Status: DC
  Administered 2020-09-09 (×2): 5 mg via ORAL
  Filled 2020-09-09 (×2): qty 1

## 2020-09-09 MED ORDER — ENOXAPARIN SODIUM 40 MG/0.4ML ~~LOC~~ SOLN: 40.0000 mg | SUBCUTANEOUS | Status: DC

## 2020-09-09 MED ORDER — ONDANSETRON HCL 4 MG/2ML IJ SOLN: 4.0000 mg | Freq: Four times a day (QID) | INTRAMUSCULAR | Status: DC | PRN

## 2020-09-09 MED ORDER — INSULIN ASPART 100 UNIT/ML ~~LOC~~ SOLN
0.0000 [IU] | Freq: Three times a day (TID) | SUBCUTANEOUS | Status: DC
  Administered 2020-09-09 (×2): 3 [IU] via SUBCUTANEOUS
  Administered 2020-09-09: 5 [IU] via SUBCUTANEOUS
  Administered 2020-09-09: 3 [IU] via SUBCUTANEOUS
  Administered 2020-09-10 (×2): 2 [IU] via SUBCUTANEOUS

## 2020-09-09 MED ORDER — PANTOPRAZOLE SODIUM 40 MG PO TBEC
40.0000 mg | DELAYED_RELEASE_TABLET | Freq: Every day | ORAL | Status: DC
  Administered 2020-09-10: 40 mg via ORAL
  Filled 2020-09-09 (×2): qty 1

## 2020-09-09 MED ORDER — LINAGLIPTIN 5 MG PO TABS
5.0000 mg | ORAL_TABLET | Freq: Every day | ORAL | Status: DC
  Administered 2020-09-09 – 2020-09-10 (×2): 5 mg via ORAL
  Filled 2020-09-09 (×2): qty 1

## 2020-09-09 MED ORDER — INSULIN GLARGINE 100 UNIT/ML ~~LOC~~ SOLN
10.0000 [IU] | Freq: Every day | SUBCUTANEOUS | Status: DC
  Administered 2020-09-09 (×2): 10 [IU] via SUBCUTANEOUS
  Filled 2020-09-09 (×3): qty 0.1

## 2020-09-09 MED ORDER — THYROID 120 MG PO TABS
120.0000 mg | ORAL_TABLET | Freq: Every day | ORAL | Status: DC
  Administered 2020-09-09 – 2020-09-10 (×2): 120 mg via ORAL
  Filled 2020-09-09 (×2): qty 1

## 2020-09-09 MED ORDER — LIVING WELL WITH DIABETES BOOK
Freq: Once | Status: AC
  Filled 2020-09-09: qty 1

## 2020-09-09 MED ORDER — LACTATED RINGERS IV BOLUS
1000.0000 mL | Freq: Once | INTRAVENOUS | Status: AC
  Administered 2020-09-09: 1000 mL via INTRAVENOUS

## 2020-09-09 MED ORDER — ACETAMINOPHEN 325 MG PO TABS: 650.0000 mg | ORAL_TABLET | ORAL | Status: DC | PRN

## 2020-09-09 MED ORDER — LOSARTAN POTASSIUM 25 MG PO TABS
25.0000 mg | ORAL_TABLET | Freq: Every day | ORAL | Status: DC
  Administered 2020-09-09: 25 mg via ORAL

## 2020-09-09 MED ORDER — NITROGLYCERIN 0.2 MG/HR TD PT24
0.2000 mg | MEDICATED_PATCH | Freq: Every day | TRANSDERMAL | Status: DC
  Administered 2020-09-09 – 2020-09-10 (×2): 0.2 mg via TRANSDERMAL
  Filled 2020-09-09 (×2): qty 1

## 2020-09-09 MED ORDER — EZETIMIBE 10 MG PO TABS
10.0000 mg | ORAL_TABLET | Freq: Every day | ORAL | Status: DC
  Administered 2020-09-09 – 2020-09-10 (×2): 10 mg via ORAL
  Filled 2020-09-09 (×2): qty 1

## 2020-09-09 MED ORDER — LOSARTAN POTASSIUM 50 MG PO TABS
50.0000 mg | ORAL_TABLET | Freq: Every day | ORAL | Status: DC
  Filled 2020-09-09: qty 1

## 2020-09-09 MED ORDER — RIVAROXABAN 15 MG PO TABS
15.0000 mg | ORAL_TABLET | Freq: Every day | ORAL | Status: DC
  Administered 2020-09-09: 15 mg via ORAL
  Filled 2020-09-09: qty 1

## 2020-09-09 MED ORDER — NITROGLYCERIN 0.4 MG SL SUBL: 0.4000 mg | SUBLINGUAL_TABLET | SUBLINGUAL | Status: DC | PRN

## 2020-09-09 MED ORDER — LOSARTAN POTASSIUM 25 MG PO TABS: 25.0000 mg | ORAL_TABLET | Freq: Every day | ORAL | Status: DC

## 2020-09-09 MED ORDER — ALBUTEROL SULFATE HFA 108 (90 BASE) MCG/ACT IN AERS: 2.0000 | INHALATION_SPRAY | Freq: Four times a day (QID) | RESPIRATORY_TRACT | Status: DC | PRN

## 2020-09-09 MED ORDER — LACTATED RINGERS IV SOLN: INTRAVENOUS | Status: DC

## 2020-09-09 MED ORDER — PERFLUTREN LIPID MICROSPHERE
1.0000 mL | INTRAVENOUS | Status: AC | PRN
  Administered 2020-09-09: 2 mL via INTRAVENOUS
  Filled 2020-09-09: qty 10

## 2020-09-09 NOTE — Progress Notes (Addendum)
Patient placed in observation after midnight.  Please see H&P.  Here with new DM and chest pain.  Chest pain could be related to BP (seems to have stopped his BP meds)-- last cath was 2017 and medical management elected.  Cards consult for medication recommendations DM- suspect due to diet (sodas etc)-- hope to be able to manage with PO medications.   LFTs seem to be elevated in the past as well (mildly) Zachary Bear DO

## 2020-09-09 NOTE — Significant Event (Signed)
Date and time results received: 09/09/20   Test: troponin Critical Value: 15  Name of Provider Notified: Dr. Cyd Silence  Orders Received? Or Actions Taken?: Orders Received - See Orders for details    EKG Completed, placed in chart, MD notified.  Patient c/o constant pressure pain across chest, declined nitro verbalizing that it won't help. He also shared that he is not used to sleeping on his back and since he has been lying on his back in the hospital which could contribute to this chest pain, denied pain radiate to other areas. RN assisted pt to lie on his left side, pt instant verbalized that his pain subside. Patient also shared that he is anxious about this new dx of DM and worries how he is going to be able to manage it at home. RN offered resources and education. MD updated.   POC: Continue rounds  Emotional support and resources given.  Possibly consult SW//CM for other resources.

## 2020-09-09 NOTE — Care Management (Signed)
Entered benefit check for linagliptin (TRADJENTA) tablet 5 mg daily x 30 days   Magdalen Spatz RN

## 2020-09-09 NOTE — Consult Note (Addendum)
Cardiology Consultation:   Patient ID: [name] MRN: [id number]; DOB: [birth year]  Admit date: 09/08/2020 Date of Consult: 09/09/2020  PCP:  [name], MD   [facility]  Cardiologist:  Peter Martinique, MD  Advanced Practice Provider:  No care team member to display Electrophysiologist:  None    Patient Profile:   [name] is a 87 y.o. male with a hx of CAD who is being seen today for the evaluation of chest pain at the request of Dr Eliseo Squires.  History of Present Illness:   [name] is a pleasant 87 year old male followed by Dr. Martinique with a history of coronary disease.  In 2015 he had a catheterization showing moderate disease in the RCA, he was treated medically.  In 2017 he again underwent catheterization this time with an intervention with DES to the RCA.  Since then he has had intermittent chest pain.  Medical therapy was attempted but the patient could not tolerate isosorbide mononitrate.  He was noted to be in atrial fibrillation in 2018 and Xarelto was added.  He did have some problems with GI bleeding after this, this was treated with a PPI.  Other significant medical issues include chronic renal insufficiency with a GFR in the 40s, history of remote prostate cancer, vascular disease, and hard of hearing.  Last office visit with Dr. Martinique was in November 2020.  The patient was admitted through the ER 09/08/2020.  Getting a clear history from the patient is difficult secondary to his hard of hearing, I got much of the history from his wife who was present.  The patient is apparently been having exertional chest pain for some time.  Usually is relieved with rest, occasionally has to take 1 nitroglycerin which helps.  Over the past couple weeks he is complained of "dizziness".  His wife describes dizziness when he gets up out of a chair, his symptoms sound orthostatic.  On his own the patient has stopped his losartan carvedilol and Xarelto over the past 2  weeks thinking his medications were causing this problem.  Yesterday afternoon he started to develop midsternal dull chest pain.  He took a nitroglycerin with no relief and then repeated it.  His wife became concerned and called EMS and he was transported to [facility].  On admission he was noted to be hypertensive.  His troponin has gradually increased to 65.  He was also noted to have a blood sugar of greater than 300 and a hemoglobin A1c of 8.3, DM is is a new diagnosis for him.  His D-dimer was also elevated at 1.13.  The patient denied any chest pain currently, he says he just feels "exhausted.   Past Medical History:  Diagnosis Date  . Anginal pain ([place])   . Anxiety    " OCCASIONAL"  . Arthritis   . Asthma due to seasonal allergies    uses inhalers prn  . Carotid artery occlusion    Left  . Complication of anesthesia    " had tremors after prostate surgery  . Coronary artery disease   . Diverticulitis    recurrent  . Family history of anesthesia complication    MOTHER   . GERD (gastroesophageal reflux disease)   . H/O hiatal hernia   . Hearing aid worn   . Hyperlipemia   . Hypertension   . Kidney stone    lithotripsy 0350 w complications, req stents, Dr Rosana Hoes  . Prostate CA ([place])   .  Shortness of breath   . Thyroid disease   . TIA (transient ischemic attack)   . Tinnitus     Past Surgical History:  Procedure Laterality Date  . CARDIAC CATHETERIZATION  2008   minimal dz, Dr Cathie Olden  . CARDIAC CATHETERIZATION N/A 10/09/2015   Procedure: Left Heart Cath and Coronary Angiography;  Surgeon: Peter M Martinique, MD;  Location: [place] [facility];  Service: Cardiovascular;  Laterality: N/A;  . CARDIAC CATHETERIZATION N/A 10/09/2015   Procedure: Intravascular Pressure Wire/FFR Study;  Surgeon: Peter M Martinique, MD;  Location: [place] [facility];  Service: Cardiovascular;  Laterality: N/A;  . CARDIAC CATHETERIZATION N/A 10/09/2015   Procedure: Coronary Stent Intervention;  Surgeon:  Peter M Martinique, MD;  Location: [place] [facility];  Service: Cardiovascular;  Laterality: N/A;  . CAROTID ENDARTERECTOMY  Left   1998  . CHOLECYSTECTOMY    . CORONARY STENT PLACEMENT  10/09/2015   DES to RCA  . ESOPHAGOGASTRODUODENOSCOPY (EGD) WITH PROPOFOL Left 03/26/2017   Procedure: ESOPHAGOGASTRODUODENOSCOPY (EGD) WITH PROPOFOL;  Surgeon: Arta Silence, MD;  Location: [facility];  Service: Endoscopy;  Laterality: Left;  . LEFT HEART CATHETERIZATION WITH CORONARY ANGIOGRAM N/A 09/15/2013   Procedure: LEFT HEART CATHETERIZATION WITH CORONARY ANGIOGRAM;  Surgeon: Peter M Martinique, MD;  Location: [facility] LAB;  Service: Cardiovascular;  Laterality: N/A;  . PROSTATECTOMY  1998   radical for prostate cancer     Home Medications:  Prior to Admission medications   Medication Sig Start Date End Date Taking? Authorizing Provider  albuterol (VENTOLIN HFA) 108 (90 Base) MCG/ACT inhaler Inhale 2 puffs into the lungs every 6 (six) hours as needed for wheezing or shortness of breath. 08/18/17  Yes Copland, Gay Filler, MD  ARMOUR THYROID 120 MG tablet TAKE 1 TABLET BY MOUTH ONCE DAILY Patient taking differently: Take 120 mg by mouth at bedtime. 05/23/20  Yes Copland, Gay Filler, MD  aspirin EC 81 MG tablet Take 81 mg by mouth daily. Swallow whole.   Yes [provider]  clobetasol (TEMOVATE) 0.05 % external solution Apply 1 application topically daily as needed (itching on scalp). 10/06/17  Yes [provider]  ezetimibe (ZETIA) 10 MG tablet TAKE 1 TABLET BY MOUTH EVERY DAY Patient taking differently: Take 10 mg by mouth daily. 07/21/19  Yes Martinique, Peter M, MD  hydrocortisone 2.5 % cream Apply 1 application topically 2 (two) times daily as needed (itching).   Yes [provider]  ipratropium (ATROVENT) 0.03 % nasal spray Place 2 sprays into the nose 4 (four) times daily. Patient taking differently: Place 2 sprays into the nose 2 (two) times daily. 03/03/19  Yes Copland, Gay Filler, MD   losartan (COZAAR) 50 MG tablet TAKE 1 TABLET BY MOUTH EVERY DAY Patient taking differently: Take 25 mg by mouth daily. 10/03/19  Yes Martinique, Peter M, MD  Naphazoline-Pheniramine (OPCON-A) 0.027-0.315 % SOLN Place 1-2 drops into both eyes 3 (three) times daily as needed (for itching, redness, and/or irritation).   Yes [provider]  nitroGLYCERIN (NITROLINGUAL) 0.4 MG/SPRAY spray Place 1 spray under the tongue every 5 (five) minutes x 3 doses as needed for chest pain. 07/18/18  Yes Martinique, Peter M, MD  sennosides-docusate sodium (SENOKOT-S) 8.6-50 MG tablet Take 1 tablet by mouth daily.   Yes [provider]  triamcinolone cream (KENALOG) 0.1 % Apply 1 application topically daily as needed (for rashes).    Yes [provider]  carvedilol (COREG) 6.25 MG tablet TAKE 1 TABLET BY MOUTH ONE  TO TWO TIMES DAILY 10/03/19   Copland, Gay Filler, MD  donepezil (ARICEPT) 5 MG tablet Take 1 tablet (5 mg total) by mouth at bedtime. Patient not taking: No sig reported 09/20/19   Copland, Gay Filler, MD  XARELTO 15 MG TABS tablet TAKE 1 TABLET (15 MG TOTAL) BY MOUTH DAILY WITH SUPPER. Patient not taking: No sig reported 02/16/20   Martinique, Peter M, MD    Inpatient Medications: Scheduled Meds: . carvedilol  6.25 mg Oral BID WC  . donepezil  5 mg Oral QHS  . ezetimibe  10 mg Oral Daily  . insulin aspart  0-15 Units Subcutaneous TID AC & HS  . insulin glargine  10 Units Subcutaneous QHS  . linagliptin  5 mg Oral Daily  . living well with diabetes book   Does not apply Once  . losartan  25 mg Oral Daily  . pantoprazole  40 mg Oral Daily  . Rivaroxaban  15 mg Oral Q supper  . thyroid  120 mg Oral QAC breakfast   Continuous Infusions:  PRN Meds: acetaminophen, albuterol, nitroGLYCERIN, ondansetron (ZOFRAN) IV  Allergies:    Allergies  Allergen Reactions  . Ciprofloxacin Other (See Comments)    Hallucinations or jitteriness  . Codeine Other (See Comments)    Hallucinations   .  Flexeril [Cyclobenzaprine] Other (See Comments)    "Made me feel goofy"  . Imdur [Isosorbide Nitrate] Other (See Comments)    Caused headaches  . Methocarbamol Other (See Comments)    "Made me feel goofy"  . Other Other (See Comments)    CANNOT HAVE ANY FOODS WITH SEEDS   . Strawberry Extract Other (See Comments)    CANNOT HAVE ANY FOODS WITH SEEDS  . Sulfa Antibiotics Other (See Comments)    Chills and shaking- "serum sickness"   . Sulfamethoxazole Other (See Comments)    Chills and shaking- "serum sickness"  . Sulfonamide Derivatives Other (See Comments)    Chills and shaking "serum sickness"  . Tomato Other (See Comments)    CANNOT HAVE ANY FOODS WITH SEEDS  . Flagyl [Metronidazole] Rash    Social History:   Social History   Socioeconomic History  . Marital status: Married    Spouse name: Not on file  . Number of children: Not on file  . Years of education: Not on file  . Highest education level: Not on file  Occupational History  . Occupation: retired    Comment: Actor [employer]  Tobacco Use  . Smoking status: Former Smoker    Packs/day: 2.50    Years: 30.00    Pack years: 75.00    Types: Cigarettes    Quit date: 08/28/1985    Years since quitting: 35.0  . Smokeless tobacco: Never Used  Substance and Sexual Activity  . Alcohol use: Yes    Alcohol/week: 2.0 standard drinks    Types: 2 Standard drinks or equivalent per week    Comment: 1 glass wine/week (prior 1 glass martini with dinner)  . Drug use: No  . Sexual activity: Yes    Birth control/protection: None  Other Topics Concern  . Not on file  Social History Narrative   Lives with wife--recently diagnosed with breast ca; No smoking; Occassional wine; Retired; 2 daughters live in [place]--5 grandchildren(boys). On weight watchers. Lives in [place] with wife. Retired Solicitor. Tobacco history 2 ppd x 32 years. No smoking x 25 years. No drugs.   Social Determinants of Health    Financial  Resource Strain: Low Risk   . Difficulty of Paying Living Expenses: Not hard at all  Food Insecurity: No Food Insecurity  . Worried About Charity fundraiser in the Last Year: Never true  . Ran Out of Food in the Last Year: Never true  Transportation Needs: No Transportation Needs  . Lack of Transportation (Medical): No  . Lack of Transportation (Non-Medical): No  Physical Activity: Not on file  Stress: Not on file  Social Connections: Not on file  Intimate Partner Violence: Not on file    Family History:    Family History  Problem Relation Age of Onset  . Heart disease Mother   . Heart disease Father   . Kidney failure Father   . Heart disease Sister   . Heart attack Sister   . Dementia Sister   . Sjogren's syndrome Child   . Hashimoto's thyroiditis Child   . CAD Child      ROS:  Please see the history of present illness.  No hemoptysis or unusual dyspnea All other ROS reviewed and negative.     Physical Exam/Data:   Vitals:   09/09/20 0432 09/09/20 0813 09/09/20 1018 09/09/20 1159  BP: (!) 157/91 (!) 171/47 (!) 168/95 (!) 154/79  Pulse: 84 86 85 79  Resp: 16 16  17   Temp: 97.7 F (36.5 C) 98.1 F (36.7 C)  98.1 F (36.7 C)  TempSrc: Oral Oral  Oral  SpO2: 94% 97%  95%  Weight:      Height:        Intake/Output Summary (Last 24 hours) at 09/09/2020 1315 Last data filed at 09/09/2020 0700 Gross per 24 hour  Intake 483.33 ml  Output 300 ml  Net 183.33 ml   Last 3 Weights 09/09/2020 09/08/2020 07/25/2020  Weight (lbs) 218 lb 4.1 oz 215 lb 218 lb 0.6 oz  Weight (kg) 99 kg 97.523 kg 98.9 kg     Body mass index is 35.23 kg/m.  General:  Overweight Caucasian male, , well developed, in no acute distress HEENT: normal-HOH Neck: no JVD Vascular: No carotid bruits Cardiac:  normal S1, S2; RRR; no murmur  Lungs:  clear to auscultation bilaterally, no wheezing, rhonchi or rales  Abd: Obese, soft, nontender, no hepatomegaly  OVF:IEPPI-9+ LE  edema Musculoskeletal:  No deformities, BUE and BLE strength normal and equal Skin: warm and dry  Neuro:  CNs 2-12 intact, no focal abnormalities noted Psych:  Normal affect   EKG:  The EKG was personally reviewed and demonstrates:  NSR- HR 91, RBBB Telemetry:  Telemetry was personally reviewed and demonstrates:  NSR  Relevant CV Studies: Echo 2019- Study Conclusions   - Left ventricle: The cavity size was normal. Systolic function was  normal. The estimated ejection fraction was in the range of 60%  to 65%. Wall motion was normal; there were no regional wall  motion abnormalities. Doppler parameters are consistent with  abnormal left ventricular relaxation (grade 1 diastolic  dysfunction). Doppler parameters are consistent with high  ventricular filling pressure (E/e&' 22).  - Aortic valve: Valve mobility was moderately restricted. There was  mild stenosis. There was no regurgitation. Mean gradient (S): 12  mm Hg. Valve area (VTI): 1.5 cm^2. Valve area (Vmax): 1.6 cm^2.  Valve area (Vmean): 1.48 cm^2. Maximum velocity and gradient  obtained from right sternal border.  - Mitral valve: Moderately calcified annulus. Transvalvular  velocity was within the normal range. There was no evidence for  stenosis. There was mild regurgitation. Mean  gradient (D): 3 mm  Hg (HR 61 bpm).  - Left atrium: Poorly visualized. The atrium was moderately  dilated.  - Right ventricle: Poorly visualized. The cavity size was normal.  Wall thickness was normal. Systolic function was normal. RV  systolic pressure (S, est): 42 mm Hg (using a right atrial  pressure estimate of 3mmHg. This maybe underestimated due to  poorly visualized IVC).  - Right atrium: Poorly visualized. The atrium was mildly dilated.  - Tricuspid valve: There was trivial regurgitation.  - Pulmonic valve: There was trivial regurgitation.  - Pulmonary arteries: Systolic pressure was moderately  increased.  - Inferior vena cava: Poorly visualized. The vessel was mildly  dilated.  - Pericardium, extracardiac: There was no pericardial effusion.   Laboratory Data:  High Sensitivity Troponin:   Recent Labs  Lab 09/08/20 1840 09/08/20 2116 09/09/20 0426  TROPONINIHS 22* 28* 65*     Chemistry Recent Labs  Lab 09/08/20 1840 09/09/20 0220  NA 131* 137  K 4.3 3.8  CL 97* 99  CO2 21* 27  GLUCOSE 370* 252*  BUN 17 15  CREATININE 1.65* 1.66*  CALCIUM 9.3 9.3  GFRNONAA 40* 40*  ANIONGAP 13 11    Recent Labs  Lab 09/08/20 1840 09/09/20 0220  PROT 6.9 6.3*  ALBUMIN 3.6 3.3*  AST 65* 52*  ALT 74* 66*  ALKPHOS 113 92  BILITOT 2.0* 1.9*   Hematology Recent Labs  Lab 09/08/20 1840 09/09/20 0220  WBC 4.3 5.1  RBC 4.09* 3.89*  HGB 14.4 13.3  HCT 40.5 38.5*  MCV 99.0 99.0  MCH 35.2* 34.2*  MCHC 35.6 34.5  RDW 14.8 14.6  PLT 103* 111*   BNP Recent Labs  Lab 09/09/20 0220  BNP 117.3*    DDimer  Recent Labs  Lab 09/09/20 0220  DDIMER 1.13*     Radiology/Studies:  DG Chest 2 View  Result Date: 09/08/2020 CLINICAL DATA:  Chest pain. EXAM: CHEST - 2 VIEW COMPARISON:  February 10, 2019. FINDINGS: Stable cardiomediastinal silhouette. No pneumothorax or pleural effusion is noted. No acute pulmonary disease is noted. Bony thorax is unremarkable. IMPRESSION: No active cardiopulmonary disease. Aortic Atherosclerosis (ICD10-I70.0). Electronically Signed   By: Marijo Conception M.D.   On: 09/08/2020 19:36   US Abdomen Limited RUQ (LIVER/GB)  Result Date: 09/08/2020 CLINICAL DATA:  Pain EXAM: ULTRASOUND ABDOMEN LIMITED RIGHT UPPER QUADRANT COMPARISON:  02/23/2019 FINDINGS: Gallbladder: Surgically absent Common bile duct: Diameter: Not well visualized Liver: The liver parenchyma is markedly heterogeneous with increased echogenicity. There is suggestion of small nodules throughout the liver parenchyma. Portal vein is patent on color Doppler imaging with normal direction of  blood flow towards the liver. Other: None. IMPRESSION: 1. The patient is status post prior cholecystectomy. 2. Markedly heterogeneous hepatic parenchyma suggestive of underlying hepatocellular disease or hepatic steatosis. Tiny nodules are noted throughout the hepatic parenchyma which are nonspecific but may represent regenerative nodules. This can be further evaluated with a contrast enhanced MRI or CT as clinically indicated. Electronically Signed   By: Constance Holster M.D.   On: 09/08/2020 22:14     Assessment and Plan:   [place]- Chest pain possibly from demand ischemia with uncontrolled hypertension.  CAD- Status post RCA DES 2017.  CRI- Chronic renal insufficiency stage IIIb with a GFR in the low 40s.  He has been followed by [facility], Dr. Joelyn Oms.  HTN- Blood pressure is uncontrolled, the patient did stop his carvedilol and losartan a couple weeks ago.  PAF-  Holding sinus rhythm.  Anticoagulated- CHADS2 VASc= 7.  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   Plan: Suggest medical Rx, pt is at high risk for renal injury with angiogram.  Losartan and Coreg resumed. Check echo for LVF.  Check orthostatics. Change losartan to Q HS (less likely to cause orthostatic symptoms). Try Nitro Dur patch (intolerant to Imdur in the past). Pt agreeable to resuming Xarelto 15, I explained that it was unlikely this was causing his dizziness. Mg. MD to see  Risk Assessment/Risk Scores:   For questions or updates, please contact [facility] Please consult [email or web address] for contact info under    Signed, Kerin Ransom, PA-C  09/09/2020 1:15 PM

## 2020-09-09 NOTE — Progress Notes (Signed)
  Echocardiogram 2D Echocardiogram has been performed.  [name] 09/09/2020, 3:34 PM

## 2020-09-09 NOTE — Progress Notes (Signed)
Inpatient Diabetes Program Recommendations  AACE/ADA: New Consensus Statement on Inpatient Glycemic Control (2015)  Target Ranges:  Prepandial:   less than 140 mg/dL      Peak postprandial:   less than 180 mg/dL (1-2 hours)      Critically ill patients:  140 - 180 mg/dL   Lab Results  Component Value Date   GLUCAP 184 (H) 09/09/2020   HGBA1C 8.3 (H) 09/09/2020    Spoke with patient and wife regarding new onset DM. Patient admits to recent thirst and has been drinking large amount of sodas at night.  Reviewed patient's current A1c of 8.3% which is up from 6.5% in Dec 2021. Explained what a A1c is and what it measures. Also reviewed goal A1c with patient, importance of good glucose control @ home, and blood sugar goals. Reviewed basic patho of DM, need for improved control, signs and symptoms of hyper vs hypo glycemia, role of pancreas, impact of cardiac, vascular changes and comorbidities.  Patient will need a glucose meter at discharge. Blood glucose meter (inlcudes lancets and strips) (#[id number]). Discussed recommended frequency, how to check and when to follow up with PCP.  LWWDM booklet ordered and dietitian. Patient admits to drinking large amount of soda at bedtime. Reviewed alternatives with patient and wife, plate method, importance of being mindful of CHO intake and incorporating snacks with protein. Wife will be helping patient with meal prepping.  In preparation for discharge, consider adding Tradjenta 5 mg QD. Patient plans to follow up with PCP in next 2 weeks.   Thanks, Bronson Curb, MSN, RNC-OB Diabetes Coordinator [phone] (8a-5p)

## 2020-09-09 NOTE — Progress Notes (Signed)
Ok to reduce losartan to his home dose per Dr. Eliseo Squires.  Onnie Boer, PharmD, BCIDP, AAHIVP, CPP Infectious Disease Pharmacist 09/09/2020 10:50 AM

## 2020-09-09 NOTE — Progress Notes (Signed)
Inpatient Diabetes Program Recommendations  AACE/ADA: New Consensus Statement on Inpatient Glycemic Control (2015)  Target Ranges:  Prepandial:   less than 140 mg/dL      Peak postprandial:   less than 180 mg/dL (1-2 hours)      Critically ill patients:  140 - 180 mg/dL   Lab Results  Component Value Date   GLUCAP 206 (H) 09/09/2020   HGBA1C 8.3 (H) 09/09/2020    Review of Glycemic Control Results for [name], [name]" (MRN [id number]) as of 09/09/2020 10:35  Ref. Range 09/09/2020 01:11 09/09/2020 05:30  Glucose-Capillary Latest Ref Range: 70 - 99 mg/dL 243 (H) 206 (H)   Diabetes history: New diagnosis DM Outpatient Diabetes medications: None Current orders for Inpatient glycemic control:  Novolog moderate tid with meals and HS Lantus 10 units q HS Inpatient Diabetes Program Recommendations:    Note new diagnosis of DM.  A1C in December 2021 was 6.5%.  Unsure what may have influenced increase of A1C??  Diabetes Coordinator will see patient today.   Thanks,  Adah Perl, RN, BC-ADM Inpatient Diabetes Coordinator Pager [phone] (8a-5p)

## 2020-09-09 NOTE — H&P (Signed)
History and Physical    ELIM ECONOMOU BHA:[id number] DOB: 1933-07-19 DOA: 09/08/2020  PCP: [name], MD  Patient coming from: Home via EMS   Chief Complaint:  Chief Complaint  Patient presents with  . Chest Pain     HPI:    87 year old male with past medical history of coronary artery disease (S/P cath 2017 with stent to RCA), hypertension, chronic kidney disease stage IIIa, gastroesophageal reflux disease, paroxysmal atrial fibrillation, obstructive sleep apnea, hypothyroidism, hyperlipidemia presenting to [facility] emergency department via EMS with complaints of chest discomfort.  Patient explains that this morning, while sitting on the couch he suddenly began to experience chest discomfort.  Patient describes the chest discomfort as midsternal in location, beginning in the center of the chest and radiating to the right shoulder.  Pain is dull in quality, moderate in intensity without any alleviating or exacerbating factors.  Pain is not changed with exertion.  Patient denies any associated diaphoresis or shortness of breath.  Patient attempted to take nitroglycerin with no change in symptoms.  Symptoms continue to persist for over 1-1/2 hours before EMS was contacted and the patient was promptly brought into [facility] emergency room for evaluation.  Of note, additionally patient reports that for the past several weeks he has been experiencing blurry vision polyuria and polydipsia.  Upon evaluation in the emergency department, troponins were found to be 22 and 28 respectively.  Chest discomfort resolved spontaneously.  Patient was incidentally found to be hyperglycemic on arrival with glucose of 370 concerning for new onset diabetes.  Due to patient's risk factors the hospitalist group was then called to assess the patient for admission the hospital.  Review of Systems:   Review of Systems  Eyes: Positive for blurred vision.  Cardiovascular: Positive for  chest pain.  Neurological: Positive for dizziness.  All other systems reviewed and are negative.   Past Medical History:  Diagnosis Date  . Anginal pain ([place])   . Anxiety    " OCCASIONAL"  . Arthritis   . Asthma due to seasonal allergies    uses inhalers prn  . Carotid artery occlusion    Left  . Complication of anesthesia    " had tremors after prostate surgery  . Coronary artery disease   . Diverticulitis    recurrent  . Family history of anesthesia complication    MOTHER   . GERD (gastroesophageal reflux disease)   . H/O hiatal hernia   . Hearing aid worn   . Hyperlipemia   . Hypertension   . Kidney stone    lithotripsy 9735 w complications, req stents, Dr Rosana Hoes  . Prostate CA ([place])   . Shortness of breath   . Thyroid disease   . TIA (transient ischemic attack)   . Tinnitus     Past Surgical History:  Procedure Laterality Date  . CARDIAC CATHETERIZATION  2008   minimal dz, Dr Cathie Olden  . CARDIAC CATHETERIZATION N/A 10/09/2015   Procedure: Left Heart Cath and Coronary Angiography;  Surgeon: Peter M Martinique, MD;  Location: [place] CV LAB;  Service: Cardiovascular;  Laterality: N/A;  . CARDIAC CATHETERIZATION N/A 10/09/2015   Procedure: Intravascular Pressure Wire/FFR Study;  Surgeon: Peter M Martinique, MD;  Location: [place] CV LAB;  Service: Cardiovascular;  Laterality: N/A;  . CARDIAC CATHETERIZATION N/A 10/09/2015   Procedure: Coronary Stent Intervention;  Surgeon: Peter M Martinique, MD;  Location: [place] CV LAB;  Service: Cardiovascular;  Laterality: N/A;  .  CAROTID ENDARTERECTOMY  Left   1998  . CHOLECYSTECTOMY    . CORONARY STENT PLACEMENT  10/09/2015   DES to RCA  . ESOPHAGOGASTRODUODENOSCOPY (EGD) WITH PROPOFOL Left 03/26/2017   Procedure: ESOPHAGOGASTRODUODENOSCOPY (EGD) WITH PROPOFOL;  Surgeon: Arta Silence, MD;  Location: [facility] ENDOSCOPY;  Service: Endoscopy;  Laterality: Left;  . LEFT HEART CATHETERIZATION WITH CORONARY ANGIOGRAM N/A 09/15/2013    Procedure: LEFT HEART CATHETERIZATION WITH CORONARY ANGIOGRAM;  Surgeon: Peter M Martinique, MD;  Location: [facility] CATH LAB;  Service: Cardiovascular;  Laterality: N/A;  . PROSTATECTOMY  1998   radical for prostate cancer     reports that he quit smoking about 35 years ago. His smoking use included cigarettes. He has a 75.00 pack-year smoking history. He has never used smokeless tobacco. He reports current alcohol use of about 2.0 standard drinks of alcohol per week. He reports that he does not use drugs.  Allergies  Allergen Reactions  . Ciprofloxacin Other (See Comments)    Hallucinations or jitteriness  . Codeine Other (See Comments)    Hallucinations   . Flexeril [Cyclobenzaprine] Other (See Comments)    "Made me feel goofy"  . Imdur [Isosorbide Nitrate] Other (See Comments)    Caused headaches  . Methocarbamol Other (See Comments)    "Made me feel goofy"  . Other Other (See Comments)    CANNOT HAVE ANY FOODS WITH SEEDS   . Strawberry Extract Other (See Comments)    CANNOT HAVE ANY FOODS WITH SEEDS  . Sulfa Antibiotics Other (See Comments)    Chills and shaking- "serum sickness"   . Sulfamethoxazole Other (See Comments)    Chills and shaking- "serum sickness"  . Sulfonamide Derivatives Other (See Comments)    Chills and shaking "serum sickness"  . Tomato Other (See Comments)    CANNOT HAVE ANY FOODS WITH SEEDS  . Flagyl [Metronidazole] Rash    Family History  Problem Relation Age of Onset  . Heart disease Mother   . Heart disease Father   . Kidney failure Father   . Heart disease Sister   . Heart attack Sister   . Dementia Sister   . Sjogren's syndrome Child   . Hashimoto's thyroiditis Child   . CAD Child      Prior to Admission medications   Medication Sig Start Date End Date Taking? Authorizing Provider  albuterol (VENTOLIN HFA) 108 (90 Base) MCG/ACT inhaler Inhale 2 puffs into the lungs every 6 (six) hours as needed for wheezing or shortness of breath. 08/18/17    Copland, Gay Filler, MD  ARMOUR THYROID 120 MG tablet TAKE 1 TABLET BY MOUTH ONCE DAILY 05/23/20   Copland, Gay Filler, MD  carvedilol (COREG) 6.25 MG tablet TAKE 1 TABLET BY MOUTH ONE TO TWO TIMES DAILY 10/03/19   Copland, Gay Filler, MD  clobetasol (TEMOVATE) 0.05 % external solution Apply 1 application topically daily.  10/06/17   [provider]  donepezil (ARICEPT) 5 MG tablet Take 1 tablet (5 mg total) by mouth at bedtime. 09/20/19   Copland, Gay Filler, MD  ezetimibe (ZETIA) 10 MG tablet TAKE 1 TABLET BY MOUTH EVERY DAY 07/21/19   Martinique, Peter M, MD  hydrocortisone 2.5 % cream Apply 1 application topically 2 (two) times daily.    [provider]  ipratropium (ATROVENT) 0.03 % nasal spray Place 2 sprays into the nose 4 (four) times daily. 03/03/19   Copland, Gay Filler, MD  losartan (COZAAR) 50 MG tablet TAKE 1 TABLET BY MOUTH EVERY DAY 10/03/19  Martinique, Peter M, MD  Naphazoline-Pheniramine (OPCON-A) 0.027-0.315 % SOLN Place 1-2 drops into both eyes 3 (three) times daily as needed (for itching, redness, and/or irritation).    [provider]  nitroGLYCERIN (NITROLINGUAL) 0.4 MG/SPRAY spray Place 1 spray under the tongue every 5 (five) minutes x 3 doses as needed for chest pain. 07/18/18   Martinique, Peter M, MD  triamcinolone cream (KENALOG) 0.1 % Apply 1 application topically daily as needed (for rashes).     [provider]  XARELTO 15 MG TABS tablet TAKE 1 TABLET (15 MG TOTAL) BY MOUTH DAILY WITH SUPPER. 02/16/20   Martinique, Peter M, MD    Physical Exam: Vitals:   09/08/20 1945 09/08/20 2030 09/08/20 2230 09/09/20 0113  BP: (!) 183/93 (!) 148/123 (!) 161/81 (!) 154/83  Pulse: 85 88 83 (!) 104  Resp: (!) 21 17 16 18   Temp:    (!) 97.1 F (36.2 C)  TempSrc:    Oral  SpO2: 99% 98% 96% 95%  Weight:    99 kg  Height:    5\' 6"  (1.676 m)    Constitutional: Acute alert and oriented x3, no associated distress.   Skin: no rashes, no lesions, poor skin turgor  noted. Eyes: Pupils are equally reactive to light.  No evidence of scleral icterus or conjunctival pallor.  ENMT: Dry mucous membranes noted.  Posterior pharynx clear of any exudate or lesions.   Neck: normal, supple, no masses, no thyromegaly.  No evidence of jugular venous distension.   Respiratory: clear to auscultation bilaterally, no wheezing, no crackles. Normal respiratory effort. No accessory muscle use.  Cardiovascular: Regular rate and rhythm, no murmurs / rubs / gallops.  +1 bilateral lower extremity pitting edema, right greater than left.  2+ pedal pulses. No carotid bruits.  Chest:   Nontender without crepitus or deformity.   Back:   Nontender without crepitus or deformity. Abdomen: Abdomen is protuberant but soft and nontender.  No evidence of intra-abdominal masses.  Positive bowel sounds noted in all quadrants.   Musculoskeletal: No joint deformity upper and lower extremities. Good ROM, no contractures. Normal muscle tone.  Neurologic: Grossly diminished hearing,  sensation intact.  Patient moving all 4 extremities spontaneously.  Patient is following all commands.  Patient is responsive to verbal stimuli.   Psychiatric: Patient exhibits normal mood with appropriate affect.  Patient seems to possess insight as to their current situation.     Labs on Admission: I have personally reviewed following labs and imaging studies -   CBC: Recent Labs  Lab 09/08/20 1840  WBC 4.3  NEUTROABS 2.0  HGB 14.4  HCT 40.5  MCV 99.0  PLT 366*   Basic Metabolic Panel: Recent Labs  Lab 09/08/20 1840  NA 131*  K 4.3  CL 97*  CO2 21*  GLUCOSE 370*  BUN 17  CREATININE 1.65*  CALCIUM 9.3   GFR: Estimated Creatinine Clearance: 36.1 mL/min (A) (by C-G formula based on SCr of 1.65 mg/dL (H)). Liver Function Tests: Recent Labs  Lab 09/08/20 1840  AST 65*  ALT 74*  ALKPHOS 113  BILITOT 2.0*  PROT 6.9  ALBUMIN 3.6   No results for input(s): LIPASE, AMYLASE in the last 168  hours. No results for input(s): AMMONIA in the last 168 hours. Coagulation Profile: No results for input(s): INR, PROTIME in the last 168 hours. Cardiac Enzymes: No results for input(s): CKTOTAL, CKMB, CKMBINDEX, TROPONINI in the last 168 hours. BNP (last 3 results) No results for input(s): PROBNP  in the last 8760 hours. HbA1C: No results for input(s): HGBA1C in the last 72 hours. CBG: Recent Labs  Lab 09/09/20 0111  GLUCAP 243*   Lipid Profile: No results for input(s): CHOL, HDL, LDLCALC, TRIG, CHOLHDL, LDLDIRECT in the last 72 hours. Thyroid Function Tests: No results for input(s): TSH, T4TOTAL, FREET4, T3FREE, THYROIDAB in the last 72 hours. Anemia Panel: No results for input(s): VITAMINB12, FOLATE, FERRITIN, TIBC, IRON, RETICCTPCT in the last 72 hours. Urine analysis:    Component Value Date/Time   COLORURINE YELLOW 02/23/2019 1401   APPEARANCEUR CLEAR 02/23/2019 1401   LABSPEC 1.025 02/23/2019 1401   PHURINE 5.5 02/23/2019 1401   GLUCOSEU NEGATIVE 02/23/2019 1401   HGBUR SMALL (A) 02/23/2019 1401   BILIRUBINUR small (A) 06/19/2020 1048   BILIRUBINUR negative 01/30/2013 0948   KETONESUR negative 06/19/2020 1048   KETONESUR NEGATIVE 02/23/2019 1401   PROTEINUR =30 (A) 06/19/2020 1048   PROTEINUR NEGATIVE 02/23/2019 1401   UROBILINOGEN 0.2 06/19/2020 1048   UROBILINOGEN 0.2 03/24/2015 0525   NITRITE Negative 06/19/2020 1048   NITRITE NEGATIVE 02/23/2019 1401   LEUKOCYTESUR Negative 06/19/2020 1048   LEUKOCYTESUR NEGATIVE 02/23/2019 1401    Radiological Exams on Admission - Personally Reviewed: DG Chest 2 View  Result Date: 09/08/2020 CLINICAL DATA:  Chest pain. EXAM: CHEST - 2 VIEW COMPARISON:  February 10, 2019. FINDINGS: Stable cardiomediastinal silhouette. No pneumothorax or pleural effusion is noted. No acute pulmonary disease is noted. Bony thorax is unremarkable. IMPRESSION: No active cardiopulmonary disease. Aortic Atherosclerosis (ICD10-I70.0). Electronically  Signed   By: Marijo Conception M.D.   On: 09/08/2020 19:36   US Abdomen Limited RUQ (LIVER/GB)  Result Date: 09/08/2020 CLINICAL DATA:  Pain EXAM: ULTRASOUND ABDOMEN LIMITED RIGHT UPPER QUADRANT COMPARISON:  02/23/2019 FINDINGS: Gallbladder: Surgically absent Common bile duct: Diameter: Not well visualized Liver: The liver parenchyma is markedly heterogeneous with increased echogenicity. There is suggestion of small nodules throughout the liver parenchyma. Portal vein is patent on color Doppler imaging with normal direction of blood flow towards the liver. Other: None. IMPRESSION: 1. The patient is status post prior cholecystectomy. 2. Markedly heterogeneous hepatic parenchyma suggestive of underlying hepatocellular disease or hepatic steatosis. Tiny nodules are noted throughout the hepatic parenchyma which are nonspecific but may represent regenerative nodules. This can be further evaluated with a contrast enhanced MRI or CT as clinically indicated. Electronically Signed   By: Constance Holster M.D.   On: 09/08/2020 22:14    EKG: Personally reviewed.  Rhythm is normal sinus rhythm with heart rate of 91 bpm.  Intraventricular conduction delay.  No dynamic ST segment changes appreciated.  Assessment/Plan Principal Problem:   Chest pain   Patient developed sudden onset chest discomfort with typical and atypical features  Chest pain has already resolved upon arrival to the emergency department  Considering patient's known history of coronary artery disease and now new diagnosis of uncontrolled diabetes and certainly is at high risk of ischemic chest pain   Continuing to cycle cardiac enzymes   Monitoring patient on telemetry   As needed nitroglycerin for further episodes of chest discomfort   Chest x-ray reveals no evidence of acute cardiopulmonary process   Patient denies history of uncontrolled acid reflux so esophageal spasm is unlikely.  While thromboembolic disease is unlikely  considering notable right lower extremity edema on exam will obtain D-dimer  N.p.o. after midnight  Cardiology consultation in the morning for consideration of noninvasive ischemic assessment.  Active Problems:    Uncontrolled type 2 diabetes mellitus  with hyperglycemia, without long-term current use of insulin (HCC)   New diagnosis of presumed diabetes mellitus considering several week history of polyuria polydipsia and blurred vision as well as substantial hyperglycemia on arrival.    Hemoglobin A1c pending  Placing patient on Accu-Cheks before every meal and nightly with sliding scale insulin  Hydrating patient with intravenous fluids as patient is relatively volume depleted due to uncontrolled sugars for the past several weeks.  Lantus 10 units nightly, will additionally likely add prandial insulin once patient's n.p.o. status is discontinued    Hypothyroidism, acquired  . Resume home regimen of Synthroid    Essential hypertension  . Resume patients home regimen or oral antihypertensives . Titrate antihypertensive regimen as necessary to achieve adequate BP control . PRN intravenous antihypertensives for excessively elevated blood pressure    Chronic kidney disease, stage 3b ([place])  . Strict intake and output monitoring . Creatinine near baseline . Minimizing nephrotoxic agents as much as possible . Serial chemistries to monitor renal function and electrolytes    AF (paroxysmal atrial fibrillation) (HCC)   Currently rate controlled  Continue home regimen of Coreg  Continue home regimen of Xarelto  Monitoring patient on telemetry    Coronary artery disease involving native coronary artery of native heart   Continue home regimen of Zetia  Continue home regimen of Coreg  Monitoring patient on telemetry    GERD without esophagitis   No evidence of uncontrolled reflux as a cause of patient's chest discomfort  Continue home regimen of PPI    Class 1  obesity due to excess calories with serious comorbidity and body mass index (BMI) of 34.0 to 34.9 in adult    Counseling patient daily on caloric restriction and regular physical activity   Code Status:  Full code Family Communication: deferred   Status is: Observation  The patient remains OBS appropriate and will d/c before 2 midnights.  Dispo: The patient is from: Home              Anticipated d/c is to: Home              Patient currently is not medically stable to d/c.   Difficult to place patient No        Vernelle Emerald MD [facility] Pager [phone]  If 7PM-7AM, please contact night-coverage [email or web address] Use universal [place] password for that web site. If you do not have the password, please call the hospital operator.  09/09/2020, 1:50 AM

## 2020-09-09 NOTE — Plan of Care (Signed)
  Problem: Education: Goal: Knowledge of General Education information will improve Description: Including pain rating scale, medication(s)/side effects and non-pharmacologic comfort measures Outcome: Completed/Met

## 2020-09-10 ENCOUNTER — Other Ambulatory Visit: Payer: Self-pay | Admitting: Internal Medicine

## 2020-09-10 DIAGNOSIS — R079 Chest pain, unspecified: Secondary | ICD-10-CM | POA: Diagnosis not present

## 2020-09-10 DIAGNOSIS — E1165 Type 2 diabetes mellitus with hyperglycemia: Secondary | ICD-10-CM | POA: Diagnosis not present

## 2020-09-10 LAB — ECHOCARDIOGRAM COMPLETE
AR max vel: 0.94 cm2
AV Area VTI: 0.97 cm2
AV Area mean vel: 0.92 cm2
AV Mean grad: 12 mmHg
AV Peak grad: 19.8 mmHg
Ao pk vel: 2.22 m/s
Area-P 1/2: 2.54 cm2
Height: 66 in
S' Lateral: 2.9 cm
Weight: 3492.09 oz

## 2020-09-10 LAB — COMPREHENSIVE METABOLIC PANEL
ALT: 51 U/L — ABNORMAL HIGH (ref 0–44)
AST: 45 U/L — ABNORMAL HIGH (ref 15–41)
Albumin: 3 g/dL — ABNORMAL LOW (ref 3.5–5.0)
Alkaline Phosphatase: 67 U/L (ref 38–126)
Anion gap: 9 (ref 5–15)
BUN: 15 mg/dL (ref 8–23)
CO2: 24 mmol/L (ref 22–32)
Calcium: 8.8 mg/dL — ABNORMAL LOW (ref 8.9–10.3)
Chloride: 103 mmol/L (ref 98–111)
Creatinine, Ser: 1.58 mg/dL — ABNORMAL HIGH (ref 0.61–1.24)
GFR, Estimated: 43 mL/min — ABNORMAL LOW (ref >60)
Glucose, Bld: 153 mg/dL — ABNORMAL HIGH (ref 70–99)
Potassium: 3.9 mmol/L (ref 3.5–5.1)
Sodium: 136 mmol/L (ref 135–145)
Total Bilirubin: 2.5 mg/dL — ABNORMAL HIGH (ref 0.3–1.2)
Total Protein: 5.8 g/dL — ABNORMAL LOW (ref 6.5–8.1)

## 2020-09-10 LAB — GLUCOSE, CAPILLARY
Glucose-Capillary: 142 mg/dL — ABNORMAL HIGH (ref 70–99)
Glucose-Capillary: 129 mg/dL — ABNORMAL HIGH (ref 70–99)

## 2020-09-10 MED ORDER — SITAGLIPTIN PHOSPHATE 50 MG PO TABS: 50.0000 mg | ORAL_TABLET | Freq: Every day | ORAL | 0 refills | Status: DC

## 2020-09-10 MED ORDER — BLOOD GLUCOSE MONITOR KIT: PACK | 0 refills | Status: DC

## 2020-09-10 MED ORDER — LINAGLIPTIN 5 MG PO TABS: 5.0000 mg | ORAL_TABLET | Freq: Every day | ORAL | 1 refills | Status: DC

## 2020-09-10 MED ORDER — LOSARTAN POTASSIUM 50 MG PO TABS: 25.0000 mg | ORAL_TABLET | Freq: Every day | ORAL | Status: DC

## 2020-09-10 MED FILL — ACCU-CHEK GUIDE TEST STRIP: 25 days supply | Qty: 100 | Fill #0

## 2020-09-10 MED FILL — ACCU-CHEK GUIDE W/DEVICE KI: W/DEVICE | 1 days supply | Qty: 1 | Fill #0

## 2020-09-10 MED FILL — ACCU-CHEK SOFTCLIX LANCETS: 25 days supply | Qty: 100 | Fill #0

## 2020-09-10 NOTE — Progress Notes (Signed)
Note plans for d/c today.  A1C=8.3%.  DPP-4 (Tradjenta) recommended however note that the cost out of pocket for patient would be 120$ per month?  Benefits check ordered for Rybelsus and Januvia as well.   If medications are too expensive, may consider lifestyle modifications and f/u with PCP.   Thanks  Adah Perl, RN, BC-ADM Inpatient Diabetes Coordinator Pager [phone] (8a-5p)

## 2020-09-10 NOTE — Discharge Instructions (Signed)
Instructions  1. Check blood sugar atleast 1-2 times per day. Usually first thing in AM and 2 hours after dinner. If consistently >200 mg/dL reach out to Primary Care doctor 2. Write down blood sugars and follow up with Primary care in 2 weeks. 3. Take medication as prescribed. 4. Watch sugary beverages and excessive intake of carbohydrates   Diabetes Mellitus and Sick Day Management Blood sugar (glucose) can be difficult to control when you are sick. Common illnesses that can cause problems for people with diabetes (diabetes mellitus) include colds, fever, flu (influenza), nausea, vomiting, and diarrhea. These illnesses can cause stress and loss of body fluids (dehydration), and those issues can cause blood glucose levels to increase. Because of this, it is very important to take your insulin and diabetes medicines and eat some form of carbohydrate when you are sick. You should make a plan for days when you are sick (sick day plan) as part of your diabetes management plan. You and your health care provider should make this plan in advance. The following guidelines are intended to help you manage an illness that lasts for about 24 hours or less. Your health care provider may also give you more specific instructions. How to manage your blood glucose  Check your blood glucose every 2-4 hours, or as often as told by your health care provider.  If you use insulin, take your usual dose. If your blood glucose continues to be too high, you may need to take an additional insulin dose as told by your health care provider.  Know your sick day treatment goals. Your target blood glucose levels may be different when you are sick.  If you use oral diabetes medicine, continue to take your medicines. Have a plan with your health care provider for these medicines while you are sick.  If you use injectable hormone medicines other than insulin to control your diabetes, have a plan with your health care provider  for these medicines while you are sick.   Follow these instructions at home Check your ketones  If you have type 1 diabetes, check your urine ketones every 4 hours.  If you have type 2 diabetes, check your urine ketones as often as told by your health care provider. Eating and drinking  Drink enough fluid to keep your urine pale yellow. This is especially important if you have a fever, vomiting, or diarrhea. Those symptoms can lead to dehydration.  Follow instructions from your health care provider about beverages to avoid.  Do not drink alcohol, caffeine, or drinks that contain a lot of sugar.  You need to eat some form of carbohydrates when you are sick. Eat 45-50 grams (45-50 g) of carbohydrates every 3-4 hours until you feel better. All of the food choices below contain about 15 g of carbohydrates. Plan ahead and keep some of these foods around so you have them if you get sick. ? 4-6 oz (120-177 mL) carbonated beverage that contains sugar, such as regular (not diet) soda. You may be able to drink carbonated beverages more easily if you open the beverage and let it sit at room temperature for a few minutes before drinking. ?  of a twin frozen ice pop. ? 4 oz (120 g) regular gelatin. ? 4 oz (120 mL) fruit juice. ? 4 oz (120 g) ice cream or frozen yogurt. ? 2 oz (60 g) sherbet. ? 1 slice bread or toast. ? 6 saltine crackers. ? 5 vanilla wafers. Medicines  Take-over-the-counter and prescription  medicines only as told by your health care provider.  Check medicine labels for added sugars. Some medicines may contain sugar or types of sugars that can raise your blood glucose level. Questions to ask your health care provider  Should I adjust my diabetes medicines?  How often do I need to check my blood glucose?  What supplies do I need to manage my diabetes at home when I am sick?  What number can I call if I have questions?  What foods and drinks should I avoid? Contact a health  care provider if:  You have been sick or have had a fever for 2 days or longer and you are not getting better.  Your blood glucose is at or above 240 mg/dl (13.3 mmol/L), even after you take an additional insulin dose.  You are unable to drink fluids without vomiting.  You have any of the following for more than 6 hours: ? Nausea. ? Vomiting. ? Diarrhea. Get help right away if:  You have difficulty breathing.  You have moderate or high ketone levels in your urine.  You have a change in how you think, feel, or act (mental status).  You develop symptoms of diabetic ketoacidosis. These include: ? Nausea. ? Vomiting. ? Excessive thirst. ? Excessive urination. ? Fruity or sweet smelling breath. ? Rapid breathing. ? Pain in the abdomen.  Your blood glucose is lower than 22m/dl (3.0 mmol/L).  You used emergency glucagon to treat low blood glucose. These symptoms may represent a serious problem that is an emergency. Do not wait to see if the symptoms will go away. Get medical help right away. Call your local emergency services (911 in the [place] Do not drive yourself to the hospital. Summary  Blood sugar (glucose) can be difficult to control when you are sick. Common illnesses that can cause problems for people with diabetes (diabetes mellitus) include colds, fever, flu (influenza), nausea, vomiting, and diarrhea.  Illnesses can cause stress and loss of body fluids (dehydration), and those issues can cause blood glucose levels to increase.  Make a plan for days when you are sick (sick day plan) as part of your diabetes management plan. You and your health care provider should make this plan in advance.  It is very important to take your insulin and diabetes medicines and to eat some form of carbohydrate when you are sick.  Contact your health care provider if have problems managing your blood glucose levels when you are sick, or if you have been sick or had a fever for 2 days or  longer and are not getting better. This information is not intended to replace advice given to you by your health care provider. Make sure you discuss any questions you have with your health care provider. Document Revised: 07/20/2019 Document Reviewed: 07/20/2019 Elsevier Patient Education  2021 [employer] [place]  Blood Glucose Monitoring, Adult Monitoring your blood sugar (glucose) is an important part of managing your diabetes. Blood glucose monitoring involves checking your blood glucose as often as directed and keeping a log or record of your results over time. Checking your blood glucose regularly and keeping a blood glucose log can:  Help you and your health care provider adjust your diabetes management plan as needed, including your medicines or insulin.  Help you understand how food, exercise, illnesses, and medicines affect your blood glucose.  Let you know what your blood glucose is at any time. You can quickly find out if you have low blood glucose (hypoglycemia)  or high blood glucose (hyperglycemia). Your health care provider will set individualized treatment goals for you. Your goals will be based on your age, other medical conditions you have, and how you respond to diabetes treatment. Generally, the goal of treatment is to maintain the following blood glucose levels:  Before meals (preprandial): 80-130 mg/dL (4.4-7.2 mmol/L).  After meals (postprandial): below 180 mg/dL (10 mmol/L).  A1C level: less than 7%. Supplies needed:  Blood glucose meter.  Test strips for your meter. Each meter has its own strips. You must use the strips that came with your meter.  A needle to prick your finger (lancet). Do not use a lancet more than one time.  A device that holds the lancet (lancing device).  A journal or log book to write down your results. How to check your blood glucose Checking your blood glucose 1. Wash your hands for at least 20 seconds with soap and water. 2. Prick the  side of your finger (not the tip) with the lancet. Do not use the same finger consecutively. 3. Gently rub the finger until a small drop of blood appears. 4. Follow instructions that come with your meter for inserting the test strip, applying blood to the strip, and using your blood glucose meter. 5. Write down your result and any notes in your log.   Using alternative sites Some meters allow you to use areas of your body other than your finger (alternative sites) to test your blood. The most common alternative sites are the forearm, the thigh, and the palm of your hand. Alternative sites may not be as accurate as the fingers because blood flow is slower in those areas. This means that the result you get may be delayed, and it may be different from the result that you would get from your finger. Use the finger only, and do not use alternative sites, if:  You think you have hypoglycemia.  You sometimes do not know that your blood glucose is getting low (hypoglycemia unawareness). General tips and recommendations Blood glucose log  Every time you check your blood glucose, write down your result. Also write down any notes about things that may be affecting your blood glucose, such as your diet and exercise for the day. This information can help you and your health care provider: ? Look for patterns in your blood glucose over time. ? Adjust your diabetes management plan as needed.  Check if your meter allows you to download your records to a computer or if there is an app for the meter. Most glucose meters store a record of glucose readings in the meter.   If you have type 1 diabetes:  Check your blood glucose 4 or more times a day if you are on intensive insulin therapy with multiple daily injections (MDI) or if you are using an insulin pump. Check your blood glucose: ? Before every meal and snack. ? Before bedtime.  Also check your blood glucose: ? If you have symptoms of  hypoglycemia. ? After treating low blood glucose. ? Before doing activities that create a risk for injury, like driving or using machinery. ? Before and after exercise. ? Two hours after a meal. ? Occasionally between 2:00 a.m. and 3:00 a.m., as directed.  You may need to check your blood glucose more often, 6-10 times per day, if: ? You have diabetes that is not well controlled. ? You are ill. ? You have a history of severe hypoglycemia. ? You have hypoglycemia unawareness. If  you have type 2 diabetes:  Check your blood glucose 2 or more times a day if you take insulin or other diabetes medicines.  Check your blood glucose 4 or more times a day if you are on intensive insulin therapy. Occasionally, you may also need to check your glucose between 2:00 a.m. and 3:00 a.m., as directed.  Also check your blood glucose: ? Before and after exercise. ? Before doing activities that create a risk for injury, like driving or using machinery.  You may need to check your blood glucose more often if: ? Your medicine is being adjusted. ? Your diabetes is not well controlled. ? You are ill. General tips  Make sure you always have your supplies with you.  After you use a few boxes of test strips, adjust (calibrate) your blood glucose meter by following instructions that came with your meter.  If you have questions or need help, all blood glucose meters have a 24-hour hotline phone number available that you can call. Also contact your health care provider with questions or concerns you may have. Where to find more information  The American Diabetes Association: www.diabetes.org  The Association of Diabetes Care & Education Specialists: www.diabeteseducator.org Contact a health care provider if:  Your blood glucose is at or above 240 mg/dL (13.3 mmol/L) for 2 days in a row.  You have been sick or have had a fever for 2 days or longer, and you are not getting better.  You have any of the  following problems for more than 6 hours: ? You cannot eat or drink. ? You have nausea or vomiting. ? You have diarrhea. Get help right away if:  Your blood glucose is lower than 54 mg/dL (3 mmol/L).  You become confused, or you have trouble thinking clearly.  You have difficulty breathing.  You have moderate or large ketone levels in your urine. These symptoms may represent a serious problem that is an emergency. Do not wait to see if the symptoms will go away. Get medical help right away. Call your local emergency services (911 in the [place] Do not drive yourself to the hospital. Summary  Monitoring your blood glucose is an important part of managing your diabetes.  Blood glucose monitoring involves checking your blood glucose as often as directed and keeping a log or record of your results over time.  Your health care provider will set individualized treatment goals for you. Your goals will be based on your age, other medical conditions you have, and how you respond to diabetes treatment.  Every time you check your blood glucose, write down your result. Also, write down any notes about things that may be affecting your blood glucose, such as your diet and exercise for the day. This information is not intended to replace advice given to you by your health care provider. Make sure you discuss any questions you have with your health care provider. Document Revised: 03/27/2020 Document Reviewed: 03/27/2020 Elsevier Patient Education  2021 [place]. Hypoglycemia Hypoglycemia is when the sugar (glucose) level in your blood is too low. Low blood sugar can happen to people who have diabetes and people who do not have diabetes. Low blood sugar can happen quickly, and it can be an emergency. What are the causes? This condition happens most often in people who have diabetes and may be caused by:  Diabetes medicine.  Not eating enough, or not eating often enough.  Doing more physical  activity.  Drinking alcohol on an empty  stomach. If you do not have diabetes, hypoglycemia may be caused by:  A tumor in the pancreas.  Not eating enough, or not eating for long periods at a time (fasting).  A very bad infection or illness.  Problems after having weight loss (bariatric) surgery.  Kidney failure or liver failure.  Certain medicines. What increases the risk? This condition is more likely to develop in people who:  Have diabetes and take medicines to lower their blood sugar.  Abuse alcohol.  Have a very bad illness. What are the signs or symptoms? Symptoms depend on whether your low blood sugar is mild, moderate, or very low. Mild  Hunger.  Feeling worried or nervous (anxious).  Sweating and feeling clammy.  Feeling dizzy or light-headed.  Being sleepy or having trouble sleeping.  Feeling like you may vomit (nauseous).  A fast heartbeat.  A headache.  Blurry vision.  Being irritable or grouchy.  Tingling or loss of feeling (numbness) around your mouth, lips, or tongue.  Trouble with moving (coordination). Moderate  Confusion and poor judgment.  Behavior changes.  Weakness.  Uneven heartbeats. Very low Very low blood sugar (severe hypoglycemia) is a medical emergency. It can cause:  Fainting.  Jerky movements that you cannot control (seizure).  Loss of consciousness (coma).  Death. How is this treated? Treating low blood sugar Low blood sugar is often treated by eating or drinking something sugary right away. The snack should contain 15 grams of a fast-acting carb (carbohydrate). Options include:  4 oz (120 mL) of fruit juice.  4-6 oz (120-150 mL) of regular soda (not diet soda).  8 oz (240 mL) of low-fat milk.  Several pieces of hard candy. Check food labels to find out how many to eat for 15 grams.  1 Tbsp (15 mL) of sugar or honey. Treating low blood sugar if you have diabetes If you can think clearly and swallow  safely, follow the 15:15 rule:  Take 15 grams of a fast-acting carb. Talk with your doctor about how much you should take.  Always keep a source of fast-acting carb with you, such as: ? Sugar tablets (glucose pills). Take 4 pills. ? Several pieces of hard candy. Check food labels to see how many pieces to eat for 15 grams. ? 4 oz (120 mL) of fruit juice. ? 4-6 oz (120-150 mL) of regular (not diet) soda. ? 1 Tbsp (15 mL) of honey or sugar.  Check your blood sugar 15 minutes after you take the carb.  If your blood sugar is still at or below 70 mg/dL (3.9 mmol/L), take 15 grams of a carb again.  If your blood sugar does not go above 70 mg/dL (3.9 mmol/L) after 3 tries, get help right away.  After your blood sugar goes back to normal, eat a meal or a snack within 1 hour.   Treating very low blood sugar If your blood sugar is at or below 54 mg/dL (3 mmol/L), you have very low blood sugar, or severe hypoglycemia. This is an emergency. Get medical help right away. If you have very low blood sugar and you cannot eat or drink, you will need to be given a hormone called glucagon. A family member or friend should learn how to check your blood sugar and how to give you glucagon. Ask your doctor if you need to have an emergency glucagon kit at home. Very low blood sugar may also need to be treated in a hospital. Follow these instructions at home: General  instructions  Take over-the-counter and prescription medicines only as told by your doctor.  Stay aware of your blood sugar as told by your doctor.  If you drink alcohol: ? Limit how much you use to:  0-1 drink a day for nonpregnant women.  0-2 drinks a day for men. ? Be aware of how much alcohol is in your drink. In the [place] one drink equals one 12 oz bottle of beer (355 mL), one 5 oz glass of wine (148 mL), or one 1 oz glass of hard liquor (44 mL).  Keep all follow-up visits as told by your doctor. This is important. If you have  diabetes:  Always have a rapid-acting carb (15 grams) option with you to treat low blood sugar.  Follow your diabetes care plan as told by your doctor. Make sure you: ? Know the symptoms of low blood sugar. ? Check your blood sugar as often as told by your doctor. Always check it before and after exercise. ? Always check your blood sugar before you drive. ? Take your medicines as told. ? Follow your meal plan. ? Eat on time. Do not skip meals.  Share your diabetes care plan with: ? Your work or school. ? People you live with.  Carry a card or wear jewelry that says you have diabetes.   Contact a doctor if:  You have trouble keeping your blood sugar in your target range.  You have low blood sugar often. Get help right away if:  You still have symptoms after you eat or drink something that contains 15 grams of fast-acting carb and you cannot get your blood sugar above 70 mg/dL by following the 15:15 rule.  Your blood sugar is at or below 54 mg/dL (3 mmol/L).  You have a seizure.  You faint. These symptoms may be an emergency. Do not wait to see if the symptoms will go away. Get medical help right away. Call your local emergency services (911 in the [place] Do not drive yourself to the hospital. Summary  Hypoglycemia happens when the level of sugar (glucose) in your blood is too low.  Low blood sugar can happen to people who have diabetes and people who do not have diabetes. Low blood sugar can happen quickly, and it can be an emergency.  Make sure you know the symptoms of low blood sugar and know how to treat it.  Always keep a source of sugar (fast-acting carb) with you to treat low blood sugar. This information is not intended to replace advice given to you by your health care provider. Make sure you discuss any questions you have with your health care provider. Document Revised: 05/24/2019 Document Reviewed: 05/24/2019 Elsevier Patient Education  2021 [place]. Hyperglycemia Hyperglycemia occurs when the level of sugar (glucose) in the blood is too high. Glucose is a type of sugar that provides the body's main source of energy. Certain hormones (insulin and glucagon) control the level of glucose in the blood. Insulin lowers blood glucose, and glucagon increases blood glucose. Hyperglycemia can result from not having enough insulin in the bloodstream, or from the body not responding normally to insulin. Hyperglycemia occurs most often in people who have diabetes (diabetes mellitus), but it can happen in people who do not have diabetes. It can develop quickly, and it can be life-threatening if it causes you to become severely dehydrated (diabetic ketoacidosis or hyperglycemic hyperosmolar state). Severe hyperglycemia is a medical emergency. For most people with diabetes, a blood glucose  level above 240 mg/dL is considered hyperglycemia. What are the causes? If you have diabetes, hyperglycemia may be caused by:  Medicines that increase blood glucose or affect your diabetes control.  Getting less physical activity.  Eating more than planned.  Being sick or injured, having an infection, or having surgery.  Stress.  Not giving yourself enough insulin (if you are taking insulin). If you have undiagnosed diabetes, this may be the reason you have hyperglycemia. If you do not have diabetes, hyperglycemia may be caused by:  Certain medicines, including: ? Steroid medicines. ? Beta-blockers. ? Epinephrine. ? Thiazide diuretics.  Stress.  Having a serious illness, an infection, or surgery.  Diseases of the pancreas. What increases the risk? Hyperglycemia is more likely to develop in people who have risk factors for diabetes, such as:  Having a family member with diabetes.  Certain conditions in which the body's disease-fighting system (immune system) attacks itself (autoimmune disorders).  Being overweight or obese.  Having an inactive  (sedentary) lifestyle.  Having been diagnosed with insulin resistance.  Having a history of prediabetes, gestational diabetes, or polycystic ovarian syndrome (PCOS). What are the signs or symptoms? Hyperglycemia may not cause any symptoms. If you do have symptoms, they may include:  Increased thirst.  Needing to urinate more often than usual.  Hunger.  Feeling very tired.  Blurry vision. Other symptoms may develop if hyperglycemia gets worse, such as:  Dry mouth.  Abdominal pain.  Loss of appetite.  Fruity-smelling breath.  Weakness.  Unexpected weight loss.  Tingling or numbness in the hands or feet.  Headache.  Cuts or bruises that are slow to heal. How is this diagnosed? Hyperglycemia is diagnosed with a blood test to measure your blood glucose level. This blood test is usually done while you are having symptoms. Your health care provider may also do a physical exam and review your medical history. You may have more tests to determine the cause of your hyperglycemia, such as:  A fasting blood glucose (FBG) test. You will not be allowed to eat (you will fast) for at least 8 hours before a blood sample is taken.  An A1C blood test. This provides information about blood glucose control over the previous 2-3 months.  An oral glucose tolerance test (OGTT). This measures your blood glucose at two times: ? After fasting. This is your baseline blood glucose level. ? 2 hours after drinking a beverage that contains glucose. How is this treated? Treatment depends on the cause of your hyperglycemia. Treatment may include:  Taking medicine to regulate your blood glucose levels. If you take insulin or other diabetes medicines, your medicine or dosage may be adjusted.  Lifestyle changes, such as exercising more, eating healthier foods, or losing weight.  Treating an illness or infection.  Checking your blood glucose more often.  Stopping or reducing steroid  medicines. If your hyperglycemia becomes severe and it results in diabetic ketoacidosis or hyperglycemic hyperosmolar state, you must be hospitalized and given IV fluids and IV insulin. Follow these instructions at home: General instructions  Take over-the-counter and prescription medicines only as told by your health care provider.  Do not use any products that contain nicotine or tobacco. These products include cigarettes, chewing tobacco, and vaping devices, such as e-cigarettes. If you need help quitting, ask your health care provider.  If you drink alcohol: ? Limit how much you have to:  0-1 drink a day for women who are not pregnant.  0-2 drinks a day for  men. ? Know how much alcohol is in a drink. In the [place] one drink equals one 12 oz bottle of beer (355 mL), one 5 oz glass of wine (148 mL), or one 1 oz glass of hard liquor (44 mL).  Learn to manage stress. If you need help with this, ask your health care provider.  Do exercises as told by your health care provider.  Keep all follow-up visits. This is important. Eating and drinking  Maintain a healthy weight.  Stay hydrated, especially when you exercise, get sick, or spend time in hot temperatures.  Drink enough fluid to keep your urine pale yellow.   If you have diabetes:  Know the symptoms of hyperglycemia.  Follow your diabetes management plan as told by your health care provider. Make sure you: ? Take your insulin and medicines as told. ? Follow your exercise plan. ? Follow your meal plan. Eat on time, and do not skip meals. ? Check your blood glucose as often as told. Make sure to check your blood glucose before and after exercise. If you exercise longer or in a different way, check your blood glucose more often. ? Follow your sick day plan whenever you cannot eat or drink normally. Make this plan in advance with your health care provider.  Share your diabetes management plan with people in your workplace,  school, and household.  Check your urine for ketones when you are ill and as told by your health care provider.  Carry a medical alert card or wear medical alert jewelry.   Where to find more information American Diabetes Association: www.diabetes.org Contact a health care provider if:  Your blood glucose is at or above 240 mg/dL (13.3 mmol/L) for 2 days in a row.  You have problems keeping your blood glucose in your target range.  You have frequent episodes of hyperglycemia.  You have signs of illness, such as nausea, vomiting, or fever. Get help right away if:  Your blood glucose monitor reads "high" even when you are taking insulin.  You have trouble breathing.  You have a change in how you think, feel, or act (mental status).  You have nausea or vomiting that does not go away. These symptoms may represent a serious problem that is an emergency. Do not wait to see if the symptoms will go away. Get medical help right away. Call your local emergency services (911 in the [place] Do not drive yourself to the hospital. Summary  Hyperglycemia occurs when the level of sugar (glucose) in the blood is too high.  Hyperglycemia can happen with or without diabetes, and severe hyperglycemia can be life-threatening.  Hyperglycemia is diagnosed with a blood test to measure your blood glucose level. This blood test is usually done while you are having symptoms. Your health care provider may also do a physical exam and review your medical history.  If you have diabetes, follow your diabetes management plan as told by your health care provider.  Contact your health care provider if you have problems keeping your blood glucose in your target range. This information is not intended to replace advice given to you by your health care provider. Make sure you discuss any questions you have with your health care provider. Document Revised: 04/12/2020 Document Reviewed: 04/12/2020 Elsevier Patient  Education  2021 [place]. Hemoglobin A1C Test Why am I having this test? You may have the hemoglobin A1C test (A1C test) done to:  Evaluate your risk for developing diabetes (diabetes mellitus).  Diagnose diabetes.  Monitor long-term control of blood sugar (glucose) in people who have diabetes and help make treatment decisions. This test may be done with other blood glucose tests, such as fasting blood glucose and oral glucose tolerance tests. What is being tested? Hemoglobin is a type of protein in the blood that carries oxygen. Glucose attaches to hemoglobin to form glycated hemoglobin. This test checks the amount of glycated hemoglobin in your blood, which is a good indicator of the average amount of glucose in your blood during the past 2-3 months. What kind of sample is taken? A blood sample is required for this test. It is usually collected by inserting a needle into a blood vessel.   Tell a health care provider about:  All medicines you are taking, including vitamins, herbs, eye drops, creams, and over-the-counter medicines.  Any blood disorders you have.  Any surgeries you have had.  Any medical conditions you have.  Whether you are pregnant or may be pregnant. How are the results reported? Your results will be reported as a percentage that indicates how much of your hemoglobin has glucose attached to it (is glycated). Your health care provider will compare your results to normal ranges that were established after testing a large group of people (reference ranges). Reference ranges may vary among labs and hospitals. For this test, common reference ranges are:  Adult or child without diabetes: 4-5.6%.  Adult or child with diabetes and good blood glucose control: less than 7%. What do the results mean? If you have diabetes:  A result of less than 7% is considered normal, meaning that your blood glucose is well controlled.  A result higher than 7% means that your blood  glucose is not well controlled, and your treatment plan may need to be adjusted. If you do not have diabetes:  A result within the reference range is considered normal, meaning that you are not at high risk for diabetes.  A result of 5.7-6.4% means that you have a high risk of developing diabetes, and you have prediabetes. Prediabetes is the condition of having a blood glucose level that is higher than it should be but not high enough for you to be diagnosed with diabetes. Having prediabetes puts you at risk for developing type 2 diabetes. You may have more tests, including a repeat A1C test.  Results of 6.5% or higher on two separate A1C tests mean that you have diabetes. You may have more tests to confirm the diagnosis. Abnormally low A1C values may be caused by:  Pregnancy.  Severe blood loss.  Receiving donated blood (transfusions).  Low red blood cell count (anemia).  Long-term kidney failure.  Some unusual forms (variants) of hemoglobin. Talk with your health care provider about what your results mean. Questions to ask your health care provider Ask your health care provider, or the department that is doing the test:  When will my results be ready?  How will I get my results?  What are my treatment options?  What other tests do I need?  What are my next steps? Summary  The A1C test may be done to evaluate your risk for developing diabetes, to diagnose diabetes, and to monitor long-term control of blood sugar (glucose) in people who have diabetes and help make treatment decisions.  Hemoglobin is a type of protein in the blood that carries oxygen. Glucose attaches to hemoglobin to form glycated hemoglobin. This test checks the amount of glycated hemoglobin in your blood, which is a  good indicator of the average amount of glucose in your blood during the past 2-3 months.  Talk with your health care provider about what your results mean. This information is not intended to  replace advice given to you by your health care provider. Make sure you discuss any questions you have with your health care provider. Document Revised: 03/27/2020 Document Reviewed: 03/27/2020 Elsevier Patient Education  2021 [place] on my medicine - XARELTO (Rivaroxaban)  This medication education was reviewed with me or my healthcare representative as part of my discharge preparation.  The pharmacist that spoke with me during my hospital stay was:  Onnie Boer, RPH-CPP  Why was Xarelto prescribed for you? Xarelto was prescribed for you to reduce the risk of a blood clot forming that can cause a stroke if you have a medical condition called atrial fibrillation (a type of irregular heartbeat).  What do you need to know about xarelto ? Take your Xarelto ONCE DAILY at the same time every day with your evening meal. If you have difficulty swallowing the tablet whole, you may crush it and mix in applesauce just prior to taking your dose.  Take Xarelto exactly as prescribed by your doctor and DO NOT stop taking Xarelto without talking to the doctor who prescribed the medication.  Stopping without other stroke prevention medication to take the place of Xarelto may increase your risk of developing a clot that causes a stroke.  Refill your prescription before you run out.  After discharge, you should have regular check-up appointments with your healthcare provider that is prescribing your Xarelto.  In the future your dose may need to be changed if your kidney function or weight changes by a significant amount.  What do you do if you miss a dose? If you are taking Xarelto ONCE DAILY and you miss a dose, take it as soon as you remember on the same day then continue your regularly scheduled once daily regimen the next day. Do not take two doses of Xarelto at the same time or on the same day.   Important Safety Information A possible side effect of Xarelto is bleeding. You  should call your healthcare provider right away if you experience any of the following: ? Bleeding from an injury or your nose that does not stop. ? Unusual colored urine (red or dark brown) or unusual colored stools (red or black). ? Unusual bruising for unknown reasons. ? A serious fall or if you hit your head (even if there is no bleeding).  Some medicines may interact with Xarelto and might increase your risk of bleeding while on Xarelto. To help avoid this, consult your healthcare provider or pharmacist prior to using any new prescription or non-prescription medications, including herbals, vitamins, non-steroidal anti-inflammatory drugs (NSAIDs) and supplements.  This website has more information on Xarelto: [email or web address]

## 2020-09-10 NOTE — Progress Notes (Signed)
RN gave pts wife discharge instructions and she stated understanding. IV has been removed and pt is dressed, glucose machine and supplies script given to the pts wife to get from home pharmacy.

## 2020-09-10 NOTE — Discharge Summary (Signed)
Physician Discharge Summary  [name] KDX:[id number] DOB: [birth year] DOA: 09/08/2020  PCP: [name], MD  Admit date: 09/08/2020 Discharge date: 09/10/2020  Admitted From: home Discharge disposition: home   Recommendations for Outpatient Follow-Up:   Patient to bring in log of blood sugars-- consider starting tradjenta if samples are available: appears to cost $120/month-- suspect patient will be able to manage with dietary changes Encouraged compliance with other meds-- asked him to bring in all supplements at next office visit BMP 1 week  Discharge Diagnosis:   Principal Problem:   Chest pain Active Problems:   GERD without esophagitis   Class 1 obesity due to excess calories with serious comorbidity and body mass index (BMI) of 34.0 to 34.9 in adult   Hypothyroidism, acquired   Essential hypertension   Chronic kidney disease, stage 3b (HCC)   AF (paroxysmal atrial fibrillation) ([place])   Coronary artery disease involving native coronary artery of native heart   Uncontrolled type 2 diabetes mellitus with hyperglycemia, without long-term current use of insulin ([place])    Discharge Condition: Improved.  Diet recommendation: Low sodium, heart healthy.  Carbohydrate-modified  Wound care: None.  Code status: Full.   History of Present Illness:   87 year old male with past medical history of coronary artery disease (S/P cath 2017 with stent to RCA), hypertension, chronic kidney disease stage IIIa, gastroesophageal reflux disease, paroxysmal atrial fibrillation, obstructive sleep apnea, hypothyroidism, hyperlipidemia presenting to [facility] emergency department via EMS with complaints of chest discomfort.  Patient explains that this morning, while sitting on the couch he suddenly began to experience chest discomfort.  Patient describes the chest discomfort as midsternal in location, beginning in the center of the chest and radiating to the right  shoulder.  Pain is dull in quality, moderate in intensity without any alleviating or exacerbating factors.  Pain is not changed with exertion.  Patient denies any associated diaphoresis or shortness of breath.  Patient attempted to take nitroglycerin with no change in symptoms.  Symptoms continue to persist for over 1-1/2 hours before EMS was contacted and the patient was promptly brought into [facility] emergency room for evaluation.  Of note, additionally patient reports that for the past several weeks he has been experiencing blurry vision polyuria and polydipsia.  Upon evaluation in the emergency department, troponins were found to be 22 and 28 respectively.  Chest discomfort resolved spontaneously.  Patient was incidentally found to be hyperglycemic on arrival with glucose of 370 concerning for new onset diabetes.  Due to patient's risk factors the hospitalist group was then called to assess the patient for admission the hospital.    Hospital Course by Problem:   New DM -will do dietary control -consider starting tradjenta  Chest Pain  -Admitted with chest pain.  Found to have new onset diabetes with hyperglycemia.  BP was severely elevated.  Troponins were flat and inconsistent with an acute coronary syndrome.  His chest pain symptoms have resolved.   -echocardiogram which shows no regional wall motion normalities.  His aortic stenosis is mild.  Close cards follow-up on discharge.  -encouraged compliance with BP meds      Medical Consultants:   cards   Discharge Exam:   Vitals:   09/10/20 0545 09/10/20 1208  BP: 139/70 123/68  Pulse: 78 75  Resp: 16 17  Temp: 98.4 F (36.9 C) 98 F (36.7 C)  SpO2: 97% 96%   Vitals:   09/09/20 1643 09/09/20 2326  09/10/20 0545 09/10/20 1208  BP: 135/76 (!) 112/51 139/70 123/68  Pulse: 77 73 78 75  Resp: 17 18 16 17   Temp: 98.5 F (36.9 C) 98.4 F (36.9 C) 98.4 F (36.9 C) 98 F (36.7 C)  TempSrc: Oral Oral Oral Oral   SpO2: 94% 96% 97% 96%  Weight:      Height:        General exam: Appears calm and comfortable.    The results of significant diagnostics from this hospitalization (including imaging, microbiology, ancillary and laboratory) are listed below for reference.     Procedures and Diagnostic Studies:   DG Chest 2 View  Result Date: 09/08/2020 CLINICAL DATA:  Chest pain. EXAM: CHEST - 2 VIEW COMPARISON:  February 10, 2019. FINDINGS: Stable cardiomediastinal silhouette. No pneumothorax or pleural effusion is noted. No acute pulmonary disease is noted. Bony thorax is unremarkable. IMPRESSION: No active cardiopulmonary disease. Aortic Atherosclerosis (ICD10-I70.0). Electronically Signed   By: Marijo Conception M.D.   On: 09/08/2020 19:36   ECHOCARDIOGRAM COMPLETE  Result Date: 09/10/2020    ECHOCARDIOGRAM REPORT   Patient Name:   [name] Date of Exam: 09/09/2020 Medical Rec #:  [id number]    Height:       66.0 in Accession #:    [id number]   Weight:       218.3 lb Date of Birth:  07/01/1934    BSA:          2.075 m Patient Age:    64 years     BP:           125/63 mmHg Patient Gender: M            HR:           72 bpm. Exam Location:  Inpatient Procedure: 2D Echo, Cardiac Doppler, Color Doppler and Intracardiac            Opacification Agent Indications:    R07.9* Chest pain, unspecified  History:        Patient has prior history of Echocardiogram examinations, most                 recent 05/20/2018. CAD, TIA, Signs/Symptoms:Dyspnea; Risk                 Factors:Hypertension and Dyslipidemia. GERD. Thyroid Disease.  Sonographer:    Jonelle Sidle Dance Referring Phys: [id number] [place]  1. Left ventricular ejection fraction, by estimation, is 70 to 75%. The left ventricle has hyperdynamic function. The left ventricle has no regional wall motion abnormalities. There is moderate concentric left ventricular hypertrophy. Left ventricular diastolic parameters are consistent with Grade I diastolic  dysfunction (impaired relaxation). Elevated left atrial pressure.  2. Right ventricular systolic function is normal. The right ventricular size is normal. There is normal pulmonary artery systolic pressure.  3. Left atrial size was mild to moderately dilated.  4. The mitral valve is normal in structure. No evidence of mitral valve regurgitation. No evidence of mitral stenosis. Moderate mitral annular calcification.  5. The aortic valve is calcified. There is severe calcifcation of the aortic valve. There is severe thickening of the aortic valve. Aortic valve regurgitation is not visualized. Mild aortic valve stenosis. Aortic valve mean gradient measures 12.0 mmHg.  6. The inferior vena cava is normal in size with greater than 50% respiratory variability, suggesting right atrial pressure of 3 mmHg. FINDINGS  Left Ventricle: Left ventricular ejection fraction, by estimation, is 70 to 75%. The left  ventricle has hyperdynamic function. The left ventricle has no regional wall motion abnormalities. Definity contrast agent was given IV to delineate the left ventricular endocardial borders. The left ventricular internal cavity size was normal in size. There is moderate concentric left ventricular hypertrophy. Left ventricular diastolic parameters are consistent with Grade I diastolic dysfunction (impaired relaxation). Elevated left atrial pressure. Right Ventricle: The right ventricular size is normal. No increase in right ventricular wall thickness. Right ventricular systolic function is normal. There is normal pulmonary artery systolic pressure. Left Atrium: Left atrial size was mild to moderately dilated. Right Atrium: Right atrial size was normal in size. Pericardium: There is no evidence of pericardial effusion. Mitral Valve: The mitral valve is normal in structure. There is moderate thickening of the mitral valve leaflet(s). There is severe calcification of the mitral valve leaflet(s). Moderate mitral annular  calcification. No evidence of mitral valve regurgitation. No evidence of mitral valve stenosis. Tricuspid Valve: The tricuspid valve is normal in structure. Tricuspid valve regurgitation is mild . No evidence of tricuspid stenosis. Aortic Valve: The aortic valve is calcified. There is severe calcifcation of the aortic valve. There is severe thickening of the aortic valve. Aortic valve regurgitation is not visualized. Mild aortic stenosis is present. Aortic valve mean gradient measures 12.0 mmHg. Aortic valve peak gradient measures 19.8 mmHg. Aortic valve area, by VTI measures 0.97 cm. Pulmonic Valve: The pulmonic valve was normal in structure. Pulmonic valve regurgitation is not visualized. No evidence of pulmonic stenosis. Aorta: The aortic root is normal in size and structure. Venous: The inferior vena cava is normal in size with greater than 50% respiratory variability, suggesting right atrial pressure of 3 mmHg. IAS/Shunts: No atrial level shunt detected by color flow Doppler.  LEFT VENTRICLE PLAX 2D LVIDd:         3.50 cm  Diastology LVIDs:         2.90 cm  LV e' medial:    4.68 cm/s LV PW:         1.70 cm  LV E/e' medial:  15.3 LV IVS:        1.30 cm  LV e' lateral:   4.68 cm/s LVOT diam:     1.70 cm  LV E/e' lateral: 15.3 LV SV:         40 LV SV Index:   19 LVOT Area:     2.27 cm  RIGHT VENTRICLE            IVC RV Basal diam:  2.60 cm    IVC diam: 1.80 cm RV S prime:     9.46 cm/s TAPSE (M-mode): 1.2 cm LEFT ATRIUM             Index       RIGHT ATRIUM          Index LA diam:        4.90 cm 2.36 cm/m  RA Area:     8.78 cm LA Vol (A2C):   68.3 ml 32.91 ml/m RA Volume:   12.30 ml 5.93 ml/m LA Vol (A4C):   55.7 ml 26.84 ml/m LA Biplane Vol: 66.2 ml 31.90 ml/m  AORTIC VALVE AV Area (Vmax):    0.94 cm AV Area (Vmean):   0.92 cm AV Area (VTI):     0.97 cm AV Vmax:           222.25 cm/s AV Vmean:          151.000 cm/s AV VTI:  0.414 m AV Peak Grad:      19.8 mmHg AV Mean Grad:      12.0 mmHg LVOT  Vmax:         92.30 cm/s LVOT Vmean:        61.000 cm/s LVOT VTI:          0.177 m LVOT/AV VTI ratio: 0.43  AORTA Ao Root diam: 3.80 cm Ao Asc diam:  3.00 cm MITRAL VALVE MV Area (PHT): 2.54 cm     SHUNTS MV Decel Time: 299 msec     Systemic VTI:  0.18 m MV E velocity: 71.60 cm/s   Systemic Diam: 1.70 cm MV A velocity: 141.00 cm/s MV E/A ratio:  0.51 Ena Dawley MD Electronically signed by Ena Dawley MD Signature Date/Time: 09/10/2020/11:22:02 AM    Final    US Abdomen Limited RUQ (LIVER/GB)  Result Date: 09/08/2020 CLINICAL DATA:  Pain EXAM: ULTRASOUND ABDOMEN LIMITED RIGHT UPPER QUADRANT COMPARISON:  02/23/2019 FINDINGS: Gallbladder: Surgically absent Common bile duct: Diameter: Not well visualized Liver: The liver parenchyma is markedly heterogeneous with increased echogenicity. There is suggestion of small nodules throughout the liver parenchyma. Portal vein is patent on color Doppler imaging with normal direction of blood flow towards the liver. Other: None. IMPRESSION: 1. The patient is status post prior cholecystectomy. 2. Markedly heterogeneous hepatic parenchyma suggestive of underlying hepatocellular disease or hepatic steatosis. Tiny nodules are noted throughout the hepatic parenchyma which are nonspecific but may represent regenerative nodules. This can be further evaluated with a contrast enhanced MRI or CT as clinically indicated. Electronically Signed   By: Constance Holster M.D.   On: 09/08/2020 22:14     Labs:   Basic Metabolic Panel: Recent Labs  Lab 09/08/20 1840 09/09/20 0220 09/10/20 0428  NA 131* 137 136  K 4.3 3.8 3.9  CL 97* 99 103  CO2 21* 27 24  GLUCOSE 370* 252* 153*  BUN 17 15 15   CREATININE 1.65* 1.66* 1.58*  CALCIUM 9.3 9.3 8.8*  MG  --  1.8  --    GFR Estimated Creatinine Clearance: 37.7 mL/min (A) (by C-G formula based on SCr of 1.58 mg/dL (H)). Liver Function Tests: Recent Labs  Lab 09/08/20 1840 09/09/20 0220 09/10/20 0428  AST 65* 52* 45*   ALT 74* 66* 51*  ALKPHOS 113 92 67  BILITOT 2.0* 1.9* 2.5*  PROT 6.9 6.3* 5.8*  ALBUMIN 3.6 3.3* 3.0*   No results for input(s): LIPASE, AMYLASE in the last 168 hours. No results for input(s): AMMONIA in the last 168 hours. Coagulation profile No results for input(s): INR, PROTIME in the last 168 hours.  CBC: Recent Labs  Lab 09/08/20 1840 09/09/20 0220  WBC 4.3 5.1  NEUTROABS 2.0 2.6  HGB 14.4 13.3  HCT 40.5 38.5*  MCV 99.0 99.0  PLT 103* 111*   Cardiac Enzymes: No results for input(s): CKTOTAL, CKMB, CKMBINDEX, TROPONINI in the last 168 hours. BNP: Invalid input(s): POCBNP CBG: Recent Labs  Lab 09/09/20 1143 09/09/20 1640 09/09/20 2121 09/10/20 0557 09/10/20 1102  GLUCAP 184* 180* 178* 142* 129*   D-Dimer Recent Labs    09/09/20 0220  DDIMER 1.13*   Hgb A1c Recent Labs    09/09/20 0220  HGBA1C 8.3*   Lipid Profile No results for input(s): CHOL, HDL, LDLCALC, TRIG, CHOLHDL, LDLDIRECT in the last 72 hours. Thyroid function studies No results for input(s): TSH, T4TOTAL, T3FREE, THYROIDAB in the last 72 hours.  Invalid input(s): FREET3 Anemia work up No results for  input(s): VITAMINB12, FOLATE, FERRITIN, TIBC, IRON, RETICCTPCT in the last 72 hours. Microbiology Recent Results (from the past 240 hour(s))  Resp Panel by RT-PCR (Flu A&B, Covid) Nasopharyngeal Swab     Status: None   Collection Time: 09/08/20 12:23 AM   Specimen: Nasopharyngeal Swab; Nasopharyngeal(NP) swabs in vial transport medium  Result Value Ref Range Status   SARS Coronavirus 2 by RT PCR NEGATIVE NEGATIVE Final    Comment: (NOTE) SARS-CoV-2 target nucleic acids are NOT DETECTED.  The SARS-CoV-2 RNA is generally detectable in upper respiratory specimens during the acute phase of infection. The lowest concentration of SARS-CoV-2 viral copies this assay can detect is 138 copies/mL. A negative result does not preclude SARS-Cov-2 infection and should not be used as the sole basis  for treatment or other patient management decisions. A negative result may occur with  improper specimen collection/handling, submission of specimen other than nasopharyngeal swab, presence of viral mutation(s) within the areas targeted by this assay, and inadequate number of viral copies(<138 copies/mL). A negative result must be combined with clinical observations, patient history, and epidemiological information. The expected result is Negative.  Fact Sheet for Patients:  EntrepreneurPulse.com.au  Fact Sheet for Healthcare Providers:  IncredibleEmployment.be  This test is no t yet approved or cleared by the [place] FDA and  has been authorized for detection and/or diagnosis of SARS-CoV-2 by FDA under an Emergency Use Authorization (EUA). This EUA will remain  in effect (meaning this test can be used) for the duration of the COVID-19 declaration under Section 564(b)(1) of the Act, 21 U.S.C.section 360bbb-3(b)(1), unless the authorization is terminated  or revoked sooner.       Influenza A by PCR NEGATIVE NEGATIVE Final   Influenza B by PCR NEGATIVE NEGATIVE Final    Comment: (NOTE) The Xpert Xpress SARS-CoV-2/FLU/RSV plus assay is intended as an aid in the diagnosis of influenza from Nasopharyngeal swab specimens and should not be used as a sole basis for treatment. Nasal washings and aspirates are unacceptable for Xpert Xpress SARS-CoV-2/FLU/RSV testing.  Fact Sheet for Patients: EntrepreneurPulse.com.au  Fact Sheet for Healthcare Providers: IncredibleEmployment.be  This test is not yet approved or cleared by the [place] FDA and has been authorized for detection and/or diagnosis of SARS-CoV-2 by FDA under an Emergency Use Authorization (EUA). This EUA will remain in effect (meaning this test can be used) for the duration of the COVID-19 declaration under Section 564(b)(1) of the Act, 21  U.S.C. section 360bbb-3(b)(1), unless the authorization is terminated or revoked.  Performed at [facility], [place] [address] [place], [place] [address]      Discharge Instructions:   Discharge Instructions    Diet - low sodium heart healthy   Complete by: As directed    Diet Carb Modified   Complete by: As directed    Discharge instructions   Complete by: As directed    Keep log of blood sugar and bring to PCP-- would check fasting in the AM and also 1-2 hours after a meal.  Would encourage you to exercise and follow a carb mod diet   Increase activity slowly   Complete by: As directed      Allergies as of 09/10/2020      Reactions   Ciprofloxacin Other (See Comments)   Hallucinations or jitteriness   Codeine Other (See Comments)   Hallucinations   Flexeril [cyclobenzaprine] Other (See Comments)   "Made me feel goofy"   Imdur [isosorbide Nitrate] Other (See Comments)  Caused headaches   Methocarbamol Other (See Comments)   "Made me feel goofy"   Other Other (See Comments)   CANNOT HAVE ANY FOODS WITH SEEDS   Strawberry Extract Other (See Comments)   CANNOT HAVE ANY FOODS WITH SEEDS   Sulfa Antibiotics Other (See Comments)   Chills and shaking- "serum sickness"   Sulfamethoxazole Other (See Comments)   Chills and shaking- "serum sickness"   Sulfonamide Derivatives Other (See Comments)   Chills and shaking "serum sickness"   Tomato Other (See Comments)   CANNOT HAVE ANY FOODS WITH SEEDS   Flagyl [metronidazole] Rash      Medication List    STOP taking these medications   aspirin EC 81 MG tablet     TAKE these medications   albuterol 108 (90 Base) MCG/ACT inhaler Commonly known as: Ventolin HFA Inhale 2 puffs into the lungs every 6 (six) hours as needed for wheezing or shortness of breath.   Armour Thyroid 120 MG tablet Generic drug: thyroid TAKE 1 TABLET BY MOUTH ONCE DAILY What changed:   how much to take  when to take this   carvedilol 6.25  MG tablet Commonly known as: COREG TAKE 1 TABLET BY MOUTH ONE TO TWO TIMES DAILY   clobetasol 0.05 % external solution Commonly known as: TEMOVATE Apply 1 application topically daily as needed (itching on scalp).   donepezil 5 MG tablet Commonly known as: Aricept Take 1 tablet (5 mg total) by mouth at bedtime.   ezetimibe 10 MG tablet Commonly known as: ZETIA TAKE 1 TABLET BY MOUTH EVERY DAY   hydrocortisone 2.5 % cream Apply 1 application topically 2 (two) times daily as needed (itching).   ipratropium 0.03 % nasal spray Commonly known as: ATROVENT Place 2 sprays into the nose 4 (four) times daily. What changed: when to take this   losartan 50 MG tablet Commonly known as: COZAAR Take 0.5 tablets (25 mg total) by mouth daily.   nitroGLYCERIN 0.4 MG/SPRAY spray Commonly known as: NITROLINGUAL Place 1 spray under the tongue every 5 (five) minutes x 3 doses as needed for chest pain.   Opcon-A 0.027-0.315 % Soln Generic drug: Naphazoline-Pheniramine Place 1-2 drops into both eyes 3 (three) times daily as needed (for itching, redness, and/or irritation).   sennosides-docusate sodium 8.6-50 MG tablet Commonly known as: SENOKOT-S Take 1 tablet by mouth daily.   triamcinolone 0.1 % Commonly known as: KENALOG Apply 1 application topically daily as needed (for rashes).   Xarelto 15 MG Tabs tablet Generic drug: Rivaroxaban TAKE 1 TABLET (15 MG TOTAL) BY MOUTH DAILY WITH SUPPER.       Follow-up Information    Copland, Gay Filler, MD Follow up in 1 week(s).   Specialty: Family Medicine Why: bring log of blood sugars Contact information: [address] [place] [address] [phone]        [place], Peter M, MD .   Specialty: Cardiology Contact information: [address] [place] [address] [phone]                Time coordinating discharge: 35 min  Signed:  Geradine Girt DO  [facility] 09/10/2020, 2:01  PM

## 2020-09-10 NOTE — Progress Notes (Signed)
Progress Note  Patient Name: [name] Date of Encounter: 09/10/2020  Primary Cardiologist: Dr. Peter Martinique, MD   Subjective   No specific complaints today. Awaiting echo results.   Inpatient Medications    Scheduled Meds: . carvedilol  6.25 mg Oral BID WC  . donepezil  5 mg Oral QHS  . ezetimibe  10 mg Oral Daily  . insulin aspart  0-15 Units Subcutaneous TID AC & HS  . insulin glargine  10 Units Subcutaneous QHS  . linagliptin  5 mg Oral Daily  . losartan  25 mg Oral QHS  . nitroGLYCERIN  0.2 mg Transdermal Daily  . pantoprazole  40 mg Oral Daily  . Rivaroxaban  15 mg Oral Q supper  . thyroid  120 mg Oral QAC breakfast   Continuous Infusions:  PRN Meds: acetaminophen, albuterol, nitroGLYCERIN, ondansetron (ZOFRAN) IV   Vital Signs    Vitals:   09/09/20 1436 09/09/20 1643 09/09/20 2326 09/10/20 0545  BP: 125/63 135/76 (!) 112/51 139/70  Pulse: 69 77 73 78  Resp:  17 18 16   Temp:  98.5 F (36.9 C) 98.4 F (36.9 C) 98.4 F (36.9 C)  TempSrc:  Oral Oral Oral  SpO2:  94% 96% 97%  Weight:      Height:        Intake/Output Summary (Last 24 hours) at 09/10/2020 0912 Last data filed at 09/10/2020 0546 Gross per 24 hour  Intake 240 ml  Output 1150 ml  Net -910 ml   Filed Weights   09/08/20 1813 09/09/20 0113  Weight: 97.5 kg 99 kg    Physical Exam   General: Elderly, NAD Lungs:Clear to ausculation bilaterally. Breathing is unlabored. Cardiovascular: RRR with S1 S2. + murmur Abdomen: Soft, non-tender, non-distended. No obvious abdominal masses. Extremities: No edema. Radial pulses 2+ bilaterally Neuro: Alert and oriented. No focal deficits. No facial asymmetry. MAE spontaneously. Psych: Responds to questions appropriately with normal affect.    Labs    Chemistry Recent Labs  Lab 09/08/20 1840 09/09/20 0220 09/10/20 0428  NA 131* 137 136  K 4.3 3.8 3.9  CL 97* 99 103  CO2 21* 27 24  GLUCOSE 370* 252* 153*  BUN 17 15 15   CREATININE 1.65*  1.66* 1.58*  CALCIUM 9.3 9.3 8.8*  PROT 6.9 6.3* 5.8*  ALBUMIN 3.6 3.3* 3.0*  AST 65* 52* 45*  ALT 74* 66* 51*  ALKPHOS 113 92 67  BILITOT 2.0* 1.9* 2.5*  GFRNONAA 40* 40* 43*  ANIONGAP 13 11 9      Hematology Recent Labs  Lab 09/08/20 1840 09/09/20 0220  WBC 4.3 5.1  RBC 4.09* 3.89*  HGB 14.4 13.3  HCT 40.5 38.5*  MCV 99.0 99.0  MCH 35.2* 34.2*  MCHC 35.6 34.5  RDW 14.8 14.6  PLT 103* 111*    Cardiac EnzymesNo results for input(s): TROPONINI in the last 168 hours. No results for input(s): TROPIPOC in the last 168 hours.   BNP Recent Labs  Lab 09/09/20 0220  BNP 117.3*     DDimer  Recent Labs  Lab 09/09/20 0220  DDIMER 1.13*     Radiology    DG Chest 2 View  Result Date: 09/08/2020 CLINICAL DATA:  Chest pain. EXAM: CHEST - 2 VIEW COMPARISON:  February 10, 2019. FINDINGS: Stable cardiomediastinal silhouette. No pneumothorax or pleural effusion is noted. No acute pulmonary disease is noted. Bony thorax is unremarkable. IMPRESSION: No active cardiopulmonary disease. Aortic Atherosclerosis (ICD10-I70.0). Electronically Signed   By: Jeneen Rinks  Murlean Caller M.D.   On: 09/08/2020 19:36   US Abdomen Limited RUQ (LIVER/GB)  Result Date: 09/08/2020 CLINICAL DATA:  Pain EXAM: ULTRASOUND ABDOMEN LIMITED RIGHT UPPER QUADRANT COMPARISON:  02/23/2019 FINDINGS: Gallbladder: Surgically absent Common bile duct: Diameter: Not well visualized Liver: The liver parenchyma is markedly heterogeneous with increased echogenicity. There is suggestion of small nodules throughout the liver parenchyma. Portal vein is patent on color Doppler imaging with normal direction of blood flow towards the liver. Other: None. IMPRESSION: 1. The patient is status post prior cholecystectomy. 2. Markedly heterogeneous hepatic parenchyma suggestive of underlying hepatocellular disease or hepatic steatosis. Tiny nodules are noted throughout the hepatic parenchyma which are nonspecific but may represent regenerative  nodules. This can be further evaluated with a contrast enhanced MRI or CT as clinically indicated. Electronically Signed   By: Constance Holster M.D.   On: 09/08/2020 22:14   Telemetry    09/10/20 NSR with rates in the 70's - Personally Reviewed  ECG    No new tracings as of 09/10/20 - Personally Reviewed  Cardiac Studies   Echocardiogram: Pending   Patient Profile     87 y.o. male with history of CAD status post PCI to the [place] in 2017 (20% LAD; 40% OM1), hypertension who was admitted on 09/08/2020 with chest pain, elevated blood pressure and hyperglycemia.  Found to have new onset diabetes.   Assessment & Plan    1. Chest pain: -Pt was admitted with chest pain found to have markedly elevated BPs on presentation after stopping his antihypertensives -HsT found to be 22>>28>>and 65 on 09/09/20>>may be demand due to hyperglycemia versus hypertension verus progressive CAD -He was also found to have newly dx DM2 -Echocardiogram performed 09/09/20 however not yet formally read  -Prior echo with mild aortic stenosis with possible progression -Intolerant to statins, continue Zetia  -Continue carvedilol -No ASA in the setting of Xarelto   2. Paroxsymal atrial fibrillation: -Maintaining NSR with stable rates  -Continue Xarelto for AC -Continue carvedilol   3. HLD: -Last LDL, 77 on 09/14/2018 -Continue Zetia   4. CAD s/p PCI to pRCA 2017: -With residual disease at 20% in the LAD and 40% in the OM1 -Plan pending echocardiogram results   5. HTN: -Stable, 139/70>>112/51>>135/76 -Continue carvedilol and losartan   6. New DM2: -HbA1c, 8.3 this admission  -Management per IM   Signed, Kathyrn Drown NP-C [facility] Pager: [phone] 09/10/2020, 9:12 AM     For questions or updates, please contact   Please consult [email or web address] for contact info under Cardiology/STEMI.

## 2020-09-10 NOTE — Evaluation (Signed)
Physical Therapy Evaluation Patient Details Name: [name] MRN: [id number] DOB: June 23, 1934 Today's Date: 09/10/2020   History of Present Illness  87 year old male with past medical history of coronary artery disease (S/P cath 2017 with stent to RCA), hypertension, chronic kidney disease stage IIIa, gastroesophageal reflux disease, paroxysmal atrial fibrillation, obstructive sleep apnea, hypothyroidism, hyperlipidemia presented 09/09/20 to [facility] emergency department via EMS with complaints of chest discomfort. Markedly elevated BP, elevated troponins, newly diagnosed DM;  Clinical Impression   Pt admitted with above diagnosis. PTA patient was modified independent with mobility and ADLs with occasional use of cane or RW. Pt currently requires minguard assist with cues for proper use of RW and displays decreased activity tolerance. Patient with the following functional limitations due to the deficits listed below: impaired balance and gait, decreased knowledge of use of DME. Pt will benefit from skilled PT to increase their independence and safety with mobility to allow discharge to the venue listed below.       Follow Up Recommendations No PT follow up;Supervision/Assistance - 24 hour    Equipment Recommendations  None recommended by PT    Recommendations for Other Services       Precautions / Restrictions Precautions Precautions: Fall Restrictions Weight Bearing Restrictions: No      Mobility  Bed Mobility Overal bed mobility: Needs Assistance Bed Mobility: Supine to Sit     Supine to sit: Min assist     General bed mobility comments: pt nearly achieves upright and complains of "tape" on his privates hurting (condom catheter); assisted to EOB via bed pad to reduce shearing    Transfers Overall transfer level: Needs assistance Equipment used: Rolling walker (2 wheeled) Transfers: Sit to/from Stand Sit to Stand: Supervision         General transfer  comment: vc for proper use of RW; no physical assist  Ambulation/Gait Ambulation/Gait assistance: Min guard Gait Distance (Feet): 35 Feet Assistive device: Rolling walker (2 wheeled) Gait Pattern/deviations: Step-through pattern;Decreased stride length;Trunk flexed     General Gait Details: vc for proper use of RW  Stairs            Wheelchair Mobility    Modified Rankin (Stroke Patients Only)       Balance Overall balance assessment: Mild deficits observed, not formally tested                                           Pertinent Vitals/Pain Pain Assessment: No/denies pain    Home Living Family/patient expects to be discharged to:: Private residence Living Arrangements: Spouse/significant other Available Help at Discharge: Family;Available 24 hours/day Type of Home: House Home Access: Stairs to enter Entrance Stairs-Rails: Right Entrance Stairs-Number of Steps: 4 Home Layout: Two level;Able to live on main level with bedroom/bathroom Home Equipment: Gilford Rile - 2 wheels;Cane - single point      Prior Function Level of Independence: Independent with assistive device(s)         Comments: no device in home; uses cane when goes out (only rarely use RW)     Hand Dominance        Extremity/Trunk Assessment   Upper Extremity Assessment Upper Extremity Assessment: Overall WFL for tasks assessed    Lower Extremity Assessment Lower Extremity Assessment: Overall WFL for tasks assessed    Cervical / Trunk Assessment Cervical / Trunk Assessment: Other exceptions Cervical / Trunk Exceptions:  overweight  Communication   Communication: HOH  Cognition Arousal/Alertness: Awake/alert Behavior During Therapy: WFL for tasks assessed/performed Overall Cognitive Status: Difficult to assess                                 General Comments: appropriate throughout when he clearly hears the comment/question      General Comments  General comments (skin integrity, edema, etc.): Wife present and pt able to understand her much better than PT's voice. States he is at or better than when he came into hospital and does not feel they need f/u PT at home.    Exercises     Assessment/Plan    PT Assessment Patient needs continued PT services  PT Problem List Decreased balance;Decreased mobility;Decreased knowledge of use of DME       PT Treatment Interventions DME instruction;Gait training;Stair training;Functional mobility training;Therapeutic activities;Patient/family education    PT Goals (Current goals can be found in the Care Plan section)  Acute Rehab PT Goals Patient Stated Goal: go home today PT Goal Formulation: With patient/family Time For Goal Achievement: 09/24/20 Potential to Achieve Goals: Good    Frequency Min 3X/week   Barriers to discharge        Co-evaluation               AM-PAC PT "6 Clicks" Mobility  Outcome Measure Help needed turning from your back to your side while in a flat bed without using bedrails?: None Help needed moving from lying on your back to sitting on the side of a flat bed without using bedrails?: A Little Help needed moving to and from a bed to a chair (including a wheelchair)?: A Little Help needed standing up from a chair using your arms (e.g., wheelchair or bedside chair)?: A Little Help needed to walk in hospital room?: A Little Help needed climbing 3-5 steps with a railing? : A Little 6 Click Score: 19    End of Session   Activity Tolerance: Patient tolerated treatment well Patient left: in chair;with call bell/phone within reach;with family/visitor present   PT Visit Diagnosis: Unsteadiness on feet (R26.81)    Time: 1110-1130 PT Time Calculation (min) (ACUTE ONLY): 20 min   Charges:   PT Evaluation $PT Eval Low Complexity: 1 Low           Arby Barrette, PT Pager [phone]   Rexanne Mano 09/10/2020, 11:54 AM

## 2020-09-10 NOTE — Care Management (Signed)
Entered benefit check for Benefits check for:  - Rybelsus 3 mg Daily x 30 days  - Januvia 50 mg Daily x 30 days

## 2020-09-12 ENCOUNTER — Telehealth: Payer: Self-pay

## 2020-09-12 NOTE — Telephone Encounter (Signed)
Transition Care Management Follow-up Telephone Call  Date of discharge and from where: 09/10/2020-[place]  How have you been since you were released from the hospital? Per wife, patient is sleeping a lot & feels achey but he is doing ok & is happy to be home.  Any questions or concerns? No  Items Reviewed:  Did the pt receive and understand the discharge instructions provided? Yes   Medications obtained and verified? Yes   Other? Yes   Any new allergies since your discharge? No   Dietary orders reviewed? Yes  Do you have support at home? Yes   Home Care and Equipment/Supplies: Were home health services ordered? no If so, what is the name of the agency? n/a  Has the agency set up a time to come to the patient's home? not applicable Were any new equipment or medical supplies ordered?  No What is the name of the medical supply agency? n/a Were you able to get the supplies/equipment? not applicable Do you have any questions related to the use of the equipment or supplies? n/a  Functional Questionnaire: (I = Independent and D = Dependent) ADLs: I  Bathing/Dressing- I  Meal Prep- I  Eating- I  Maintaining continence- I  Transferring/Ambulation- I  Managing Meds- I  Follow up appointments reviewed:   PCP Hospital f/u appt confirmed? Yes  Scheduled to see Dr. Lorelei Pont on 09/18/20 @ 10:40.  [facility] f/u appt confirmed? Yes  Scheduled to see Fabian Sharp on 10/07/20 @ 8:15  Are transportation arrangements needed? No   If their condition worsens, is the pt aware to call PCP or go to the Emergency Dept.? Yes  Was the patient provided with contact information for the PCP's office or ED? Yes  Was to pt encouraged to call back with questions or concerns? Yes

## 2020-09-15 NOTE — Progress Notes (Signed)
[name], [place], [place] [address] [name] [phone]  Date:  09/18/2020   Name:  [name]   DOB:  Mar 08, 1934   MRN:  [id number]  PCP:  [name], MD    Chief Complaint: Hospitalization Follow-up (FEELING TIRED )   History of Present Illness:  TAB [name] is a 87 y.o. very pleasant male patient who presents with the following:  Here today for a hospital follow-up visit - History of hypertension, TIA, CAD, paroxysmal atrial fib, carotid artery occlusion status post endarterectomy, sleep apnea, chronic renal disease, hyperlipidemia, memory loss, hard of hearing, prostate cancer status post radical prostatectomy in 1998, hypothyroidism-also chronic stable angina Last seen by myself in December  He was recently admitted for a few days as follows with an episode of chest pain He was also found to have diabetes, A1c had increased from 6.5 (first elevated A1c in December) to 8.3%  Admit date: 09/08/2020 Discharge date: 09/10/2020 Recommendations for Outpatient Follow-Up:   Patient to bring in log of blood sugars-- consider starting tradjenta if samples are available: appears to cost $120/month-- suspect patient will be able to manage with dietary changes Encouraged compliance with other meds-- asked him to bring in all supplements at next office visit BMP 1 week  Discharge Diagnosis:   Principal Problem:   Chest pain Active Problems:   GERD without esophagitis   Class 1 obesity due to excess calories with serious comorbidity and body mass index (BMI) of 34.0 to 34.9 in adult   Hypothyroidism, acquired   Essential hypertension   Chronic kidney disease, stage 3b (HCC)   AF (paroxysmal atrial fibrillation) ([place])   Coronary artery disease involving native coronary artery of native heart   Uncontrolled type 2 diabetes mellitus with hyperglycemia, without long-term current use of insulin  (HCC)  History of Present Illness:   87 year old male with past medical history of coronary artery disease(S/P cath 2017 with stent to RCA),hypertension, chronic kidney disease stage IIIa, gastroesophageal reflux disease, paroxysmal atrial fibrillation, obstructive sleep apnea, hypothyroidism, hyperlipidemia presenting to [facility] emergency department via EMS with complaints of chest discomfort.  Patient explains that this morning, while sitting on the couch he suddenly began to experience chest discomfort. Patient describes the chest discomfort as midsternal in location, beginning in the center of the chest and radiating to the right shoulder. Pain is dull in quality, moderate in intensity without any alleviating or exacerbating factors. Pain is not changed with exertion. Patient denies any associated diaphoresis or shortness of breath. Patient attempted to take nitroglycerin with no change in symptoms. Symptoms continue to persist for over 1-1/2 hours before EMS was contacted and the patient was promptly brought into [facility] emergency room for evaluation.  Of note, additionally patient reports that for the past several weeks he has been experiencing blurry vision polyuria and polydipsia.  Upon evaluation in the emergency department, troponins were found to be 22 and 28 respectively. Chest discomfort resolved spontaneously. Patient was incidentally found to be hyperglycemic on arrival with glucose of 370 concerning for new onset diabetes. Due to patient's risk factors the hospitalist group was then called to assess the patient for admission the hospital.  Hospital Course by Problem:   New DM -will do dietary control -consider starting tradjenta  Chest Pain -Admitted with chest pain. Found to have new onset diabetes with hyperglycemia. BP was severely elevated. Troponins were flat and  inconsistent with an acute coronary syndrome. His chest pain  symptoms have resolved.  -echocardiogram which shows no regional wall motion normalities. His aortic stenosis is mild.  Close cards follow-up on discharge. -encouraged compliance with BP meds   They have been checking his blood sugar 3 times a day, they bring in blood sugar log.  Sugars are typically running from 125-200. Cannot use Metformin due to renal function Ideally would like to use SGLT-2 due to other potential benefits  I prescribed Farxiga 5 mg, counseled him to let me know if this medication is not affordable Okay to stop frequent blood sugar checks We discussed a reasonable dietary management for this 87 year old patient.  I would encourage reduced carb intake, but no need to go on a strict diet or count carbs at this time  Patient notes chest pain has resolved     Patient Active Problem List   Diagnosis Date Noted  . Uncontrolled type 2 diabetes mellitus with hyperglycemia, without long-term current use of insulin ([place]) 09/09/2020  . Chest pain 09/08/2020  . Acute diverticulitis 02/07/2019  . Angina pectoris, variant ([place]) 05/19/2018  . Poor compliance with CPAP treatment 04/20/2018  . Excessive daytime sleepiness 03/22/2018  . Poor sleep hygiene 03/22/2018  . Ventral hernia without obstruction or gangrene 10/09/2017  . TIA (transient ischemic attack)   . Kidney stone   . Hypertension   . Hearing aid worn   . Coronary artery disease involving native coronary artery of native heart   . Asthma due to seasonal allergies   . Arthritis   . Erosive gastropathy 03/26/2017  . Acute blood loss anemia 03/25/2017  . Melena 03/24/2017  . Chronic kidney disease, stage 3b ([place]) 03/24/2017  . AF (paroxysmal atrial fibrillation) ([place]) 03/24/2017  . Angina pectoris ([place]) 10/09/2015  . Essential hypertension   . Hyperlipemia   . Carotid artery occlusion   . Dizziness 08/09/2015  . Gait instability 05/30/2015  . Hypothyroidism, acquired 10/26/2013  . OSA (obstructive  sleep apnea) 10/18/2013  . Benign localized hyperplasia of prostate with urinary obstruction and other lower urinary tract symptoms (LUTS)(600.21) 12/21/2012  . Carcinoma in situ of prostate 12/21/2012  . Left shoulder pain 08/08/2012  . Class 1 obesity due to excess calories with serious comorbidity and body mass index (BMI) of 34.0 to 34.9 in adult 07/24/2011  . Generalized anxiety disorder 07/24/2011  . DIVERTICULOSIS OF COLON 12/25/2009  . DERMATITIS, SEBORRHEIC 12/25/2009  . 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 Memory loss 08/14/2009  . VENOUS INSUFFICIENCY, CHRONIC 09/09/2006  . GERD without esophagitis 09/09/2006  . HERNIA, HIATAL, NONCONGENITAL 09/09/2006  . INSOMNIA NOS 09/09/2006    Past Medical History:  Diagnosis Date  . Anginal pain ([place])   . Anxiety    " OCCASIONAL"  . Arthritis   . Asthma due to seasonal allergies    uses inhalers prn  . Carotid artery occlusion    Left  . Complication of anesthesia    " had tremors after prostate surgery  . Coronary artery disease   . Diverticulitis    recurrent  . Family history of anesthesia complication    MOTHER   . GERD (gastroesophageal reflux disease)   . H/O hiatal hernia   . Hearing aid worn   . Hyperlipemia   . Hypertension   . Kidney stone    lithotripsy 0272 w complications, req stents, Dr Rosana Hoes  . Prostate CA ([place])   . Shortness of breath   . Thyroid disease   .  TIA (transient ischemic attack)   . Tinnitus     Past Surgical History:  Procedure Laterality Date  . CARDIAC CATHETERIZATION  2008   minimal dz, Dr Cathie Olden  . CARDIAC CATHETERIZATION N/A 10/09/2015   Procedure: Left Heart Cath and Coronary Angiography;  Surgeon: Peter M Martinique, MD;  Location: [place] CV LAB;  Service: Cardiovascular;  Laterality: N/A;  . CARDIAC CATHETERIZATION N/A 10/09/2015   Procedure: Intravascular Pressure Wire/FFR Study;  Surgeon: Peter M Martinique, MD;  Location: [place] CV LAB;  Service: Cardiovascular;   Laterality: N/A;  . CARDIAC CATHETERIZATION N/A 10/09/2015   Procedure: Coronary Stent Intervention;  Surgeon: Peter M Martinique, MD;  Location: [place] CV LAB;  Service: Cardiovascular;  Laterality: N/A;  . CAROTID ENDARTERECTOMY  Left   1998  . CHOLECYSTECTOMY    . CORONARY STENT PLACEMENT  10/09/2015   DES to RCA  . ESOPHAGOGASTRODUODENOSCOPY (EGD) WITH PROPOFOL Left 03/26/2017   Procedure: ESOPHAGOGASTRODUODENOSCOPY (EGD) WITH PROPOFOL;  Surgeon: Arta Silence, MD;  Location: [facility];  Service: Endoscopy;  Laterality: Left;  . LEFT HEART CATHETERIZATION WITH CORONARY ANGIOGRAM N/A 09/15/2013   Procedure: LEFT HEART CATHETERIZATION WITH CORONARY ANGIOGRAM;  Surgeon: Peter M Martinique, MD;  Location: [facility] CATH LAB;  Service: Cardiovascular;  Laterality: N/A;  . PROSTATECTOMY  1998   radical for prostate cancer    Social History   Tobacco Use  . Smoking status: Former Smoker    Packs/day: 2.50    Years: 30.00    Pack years: 75.00    Types: Cigarettes    Quit date: 08/28/1985    Years since quitting: 35.0  . Smokeless tobacco: Never Used  Substance Use Topics  . Alcohol use: Yes    Alcohol/week: 2.0 standard drinks    Types: 2 Standard drinks or equivalent per week    Comment: 1 glass wine/week (prior 1 glass martini with dinner)  . Drug use: No    Family History  Problem Relation Age of Onset  . Heart disease Mother   . Heart disease Father   . Kidney failure Father   . Heart disease Sister   . Heart attack Sister   . Dementia Sister   . Sjogren's syndrome Child   . Hashimoto's thyroiditis Child   . CAD Child     Allergies  Allergen Reactions  . Ciprofloxacin Other (See Comments)    Hallucinations or jitteriness  . Codeine Other (See Comments)    Hallucinations   . Flexeril [Cyclobenzaprine] Other (See Comments)    "Made me feel goofy"  . Imdur [Isosorbide Nitrate] Other (See Comments)    Caused headaches  . Methocarbamol Other (See Comments)    "Made me  feel goofy"  . Other Other (See Comments)    CANNOT HAVE ANY FOODS WITH SEEDS   . Strawberry Extract Other (See Comments)    CANNOT HAVE ANY FOODS WITH SEEDS  . Sulfa Antibiotics Other (See Comments)    Chills and shaking- "serum sickness"   . Sulfamethoxazole Other (See Comments)    Chills and shaking- "serum sickness"  . Sulfonamide Derivatives Other (See Comments)    Chills and shaking "serum sickness"  . Tomato Other (See Comments)    CANNOT HAVE ANY FOODS WITH SEEDS  . Flagyl [Metronidazole] Rash    Medication list has been reviewed and updated.  Current Outpatient Medications on File Prior to Visit  Medication Sig Dispense Refill  . albuterol (VENTOLIN HFA) 108 (90 Base) MCG/ACT inhaler Inhale 2 puffs into the lungs  every 6 (six) hours as needed for wheezing or shortness of breath. 18 g 5  . ARMOUR THYROID 120 MG tablet TAKE 1 TABLET BY MOUTH ONCE DAILY (Patient taking differently: Take 120 mg by mouth at bedtime.) 90 tablet 1  . blood glucose meter kit and supplies KIT Dispense based on patient and insurance preference. Use up to four times daily as directed. 1 each 0  . carvedilol (COREG) 6.25 MG tablet TAKE 1 TABLET BY MOUTH ONE TO TWO TIMES DAILY 180 tablet 1  . clobetasol (TEMOVATE) 0.05 % external solution Apply 1 application topically daily as needed (itching on scalp).  3  . ezetimibe (ZETIA) 10 MG tablet TAKE 1 TABLET BY MOUTH EVERY DAY (Patient taking differently: Take 10 mg by mouth daily.) 90 tablet 3  . hydrocortisone 2.5 % cream Apply 1 application topically 2 (two) times daily as needed (itching).    Marland Kitchen ipratropium (ATROVENT) 0.03 % nasal spray Place 2 sprays into the nose 4 (four) times daily. (Patient taking differently: Place 2 sprays into the nose 2 (two) times daily.) 30 mL 5  . losartan (COZAAR) 50 MG tablet Take 0.5 tablets (25 mg total) by mouth daily.    . Naphazoline-Pheniramine (OPCON-A) 0.027-0.315 % SOLN Place 1-2 drops into both eyes 3 (three) times  daily as needed (for itching, redness, and/or irritation).    . nitroGLYCERIN (NITROLINGUAL) 0.4 MG/SPRAY spray Place 1 spray under the tongue every 5 (five) minutes x 3 doses as needed for chest pain. 12 g 5  . sennosides-docusate sodium (SENOKOT-S) 8.6-50 MG tablet Take 1 tablet by mouth daily.    Marland Kitchen triamcinolone cream (KENALOG) 0.1 % Apply 1 application topically daily as needed (for rashes).     Alveda Reasons 15 MG TABS tablet TAKE 1 TABLET (15 MG TOTAL) BY MOUTH DAILY WITH SUPPER. 90 tablet 1  . alfuzosin (UROXATRAL) 10 MG 24 hr tablet Take 10 mg by mouth daily.    . tamsulosin (FLOMAX) 0.4 MG CAPS capsule Take 0.4 mg by mouth daily.     No current facility-administered medications on file prior to visit.    Review of Systems:  As per HPI- otherwise negative.   Physical Examination: Vitals:   09/18/20 1041  BP: (!) 144/70  Pulse: 83  Temp: 97.7 F (36.5 C)  SpO2: 99%   Vitals:   09/18/20 1041  Weight: 218 lb 6.4 oz (99.1 kg)  Height: 5' 6"  (1.676 m)   Body mass index is 35.25 kg/m. Ideal Body Weight: Weight in (lb) to have BMI = 25: 154.6  GEN: no acute distress.  Looks his normal self, patient is quite hard of hearing, most communication with patient is through his wife who accompanies him today HEENT: Atraumatic, Normocephalic.  Ears and Nose: No external deformity. CV: RRR, No M/G/R. No JVD. No thrill. No extra heart sounds. PULM: CTA B, no wheezes, crackles, rhonchi. No retractions. No resp. distress. No accessory muscle use. ABD: S, NT, ND. No rebound. No HSM. EXTR: No c/c/e PSYCH: Normally interactive. Conversant.  Using cane  Assessment and Plan: Diabetes mellitus without complication ([place]) - Plan: dapagliflozin propanediol (FARXIGA) 5 MG TABS tablet  Hospital discharge follow-up  Bilateral hearing loss, unspecified hearing loss type  Patient is here today for hospital follow-up visit.  He was recently admitted for 2 nights with concern of chest pain and  newly diagnosed diabetes.  Chest pain has resolved Will start on Farxiga 5 mg daily for blood sugar control.  Also discussed reasonable  dietary management Asked patient to follow-up with me in 3 to 4 months-let me know sooner if any concerns This visit occurred during the SARS-CoV-2 public health emergency.  Safety protocols were in place, including screening questions prior to the visit, additional usage of staff PPE, and extensive cleaning of exam room while observing appropriate contact time as indicated for disinfecting solutions.    Signed Lamar Blinks, MD

## 2020-09-17 ENCOUNTER — Other Ambulatory Visit: Payer: Self-pay | Admitting: Cardiology

## 2020-09-18 ENCOUNTER — Ambulatory Visit: Payer: Medicare Other | Admitting: Family Medicine

## 2020-09-18 ENCOUNTER — Encounter: Payer: Self-pay | Admitting: Family Medicine

## 2020-09-18 ENCOUNTER — Other Ambulatory Visit: Payer: Self-pay

## 2020-09-18 VITALS — BP 144/70 | HR 83 | Temp 97.7°F | Ht 66.0 in | Wt 218.4 lb

## 2020-09-18 DIAGNOSIS — H9193 Unspecified hearing loss, bilateral: Secondary | ICD-10-CM

## 2020-09-18 DIAGNOSIS — Z09 Encounter for follow-up examination after completed treatment for conditions other than malignant neoplasm: Secondary | ICD-10-CM

## 2020-09-18 DIAGNOSIS — E119 Type 2 diabetes mellitus without complications: Secondary | ICD-10-CM

## 2020-09-18 MED ORDER — DAPAGLIFLOZIN PROPANEDIOL 5 MG PO TABS
5.0000 mg | ORAL_TABLET | Freq: Every day | ORAL | 6 refills | Status: DC
Start: 2020-09-18 — End: 2021-04-30

## 2020-09-18 NOTE — Patient Instructions (Signed)
It was good to see you today-  We will have you take the tamsulosin for one month, then go back on the afluzosin  Try to eat fewer carbs and more veggies/ protein, but you can still enjoy your favorite foods and sweets at times  You do not need to check frequent home blood sugars. If you like ok to check once or twice a week, or check if you are not feeling well.  As long as your glucose is between 80 and 200 we are ok.  We will monitor your progress more based on your A1c (average glucose over 3 months which I will check for you periodically)   I have prescribed Wilder Glade for you today to take for your blood sugars.  However, if this is too expensive DO NOT FILL and let me know!    Assuming all is well we can visit in 3-4 months

## 2020-09-19 ENCOUNTER — Other Ambulatory Visit: Payer: Self-pay | Admitting: Cardiology

## 2020-09-27 ENCOUNTER — Other Ambulatory Visit: Payer: Self-pay | Admitting: Cardiology

## 2020-10-01 NOTE — Progress Notes (Deleted)
Cardiology Office Note:    Date:  10/01/2020   ID:  [name], DOB [birth year], MRN [id number]  PCP:  [name], MD  Cardiologist:  Peter Martinique, MD   Referring MD: [name], MD   No chief complaint on file. ***  History of Present Illness:    [name] is a 87 y.o. male with a hx of CAD, HTN, HLD, hx of carotid artery disease s/p L CEA (1998), PAF, TIA, and prostate cancer s/p radical prostatectomy 1998.  Left heart cath 09/2013 with 70% proximal RCA stenosis with FFR 0.82 treated medically. Left heart cath 09/2015 showed 70% proximal RCA with FFR 0.76 treated with DES. He has not tolerated a statin due to arthralgias (2016), he is maintained on zetia. Dr. Scot Dock follows his carotid artery stenosis - 70-80% right ICA stenosis Dec 2016. She was diagnosed with Afib in the ER 03/2017 and started on xarelto. Unfortunately, he returned to the ER with melena and xarelto was stopped. EGD with erosive gastritis treated with PPI. He was seen 04/14/18 with exertional chest pain and hypervolemia. He was started on imdur 30 mg. Losartan-HCTZ was D/C'ed and he was transitioned to losartan and lasix 20 mg. Echo Nov 2019 showed EF 60-65%, grade 1 DD, mild AS, mild MR, RVSP 42 mmHg. Pt never started imdur due to reading side effects including headache. He was last seen in clinic 05/30/19 and was doing fairly well, although he is inactive due to balance issues.   He was recently admitted to the hospital 09/09/20 due to chest pain. He stopped taking his BP medications 4 days prior due to dizziness and presented in hypertensive urgency with SBP 180s. HST elevated was mild and flat, consistent with demand ischemia in the setting of uncontrolled hypertension. Echo at that time with hyperdynamic EF, grade 1 DD, and mild MR. BP medications were restarted and he was discharged. Elevated A1c - this was new DM diagnosis for him.   He presents today for follow up. He is extremely HOH and is here with  his wife who helps with history.     CAD s/p DES-RCA - continue  - has had exertional chest pain since 2017, managed medically by Dr. Martinique    Hypertension - coreg, losartan  Hyperlipidemia with LDL goal < 70 06/19/2020: Cholesterol 155; HDL 32.00; Triglycerides 257.0; VLDL 51.4  - intolerant to statins, taking zetia   Grade 1 DD - not on daily diuretic   Paroxysmal atrial fibrillation Chronic anticoagulaton - 15 mg xarelto due to renal function - rate controlled on coreg - pt tolerates the rhythm well   DM with hyperglycemia - A1c 8.3% - new diagnosis at recent hospitalization. - management deferred to PCP - has been started on farxiga     Past Medical History:  Diagnosis Date  . Anginal pain ([place])   . Anxiety    " OCCASIONAL"  . Arthritis   . Asthma due to seasonal allergies    uses inhalers prn  . Carotid artery occlusion    Left  . Complication of anesthesia    " had tremors after prostate surgery  . Coronary artery disease   . Diverticulitis    recurrent  . Family history of anesthesia complication    MOTHER   . GERD (gastroesophageal reflux disease)   . H/O hiatal hernia   . Hearing aid worn   . Hyperlipemia   . Hypertension   . Kidney stone    lithotripsy 2011  w complications, req stents, Dr Rosana Hoes  . Prostate CA ([place])   . Shortness of breath   . Thyroid disease   . TIA (transient ischemic attack)   . Tinnitus     Past Surgical History:  Procedure Laterality Date  . CARDIAC CATHETERIZATION  2008   minimal dz, Dr Cathie Olden  . CARDIAC CATHETERIZATION N/A 10/09/2015   Procedure: Left Heart Cath and Coronary Angiography;  Surgeon: Peter M Martinique, MD;  Location: [place] CV LAB;  Service: Cardiovascular;  Laterality: N/A;  . CARDIAC CATHETERIZATION N/A 10/09/2015   Procedure: Intravascular Pressure Wire/FFR Study;  Surgeon: Peter M Martinique, MD;  Location: [place] CV LAB;  Service: Cardiovascular;  Laterality: N/A;  . CARDIAC  CATHETERIZATION N/A 10/09/2015   Procedure: Coronary Stent Intervention;  Surgeon: Peter M Martinique, MD;  Location: [place] CV LAB;  Service: Cardiovascular;  Laterality: N/A;  . CAROTID ENDARTERECTOMY  Left   1998  . CHOLECYSTECTOMY    . CORONARY STENT PLACEMENT  10/09/2015   DES to RCA  . ESOPHAGOGASTRODUODENOSCOPY (EGD) WITH PROPOFOL Left 03/26/2017   Procedure: ESOPHAGOGASTRODUODENOSCOPY (EGD) WITH PROPOFOL;  Surgeon: Arta Silence, MD;  [facility]: [facility];  Service: Endoscopy;  Laterality: Left;  . LEFT HEART CATHETERIZATION WITH CORONARY ANGIOGRAM N/A 09/15/2013   Procedure: LEFT HEART CATHETERIZATION WITH CORONARY ANGIOGRAM;  Surgeon: Peter M Martinique, MD;  Location: [facility] CATH LAB;  Service: Cardiovascular;  Laterality: N/A;  . PROSTATECTOMY  1998   radical for prostate cancer    Current Medications: No outpatient medications have been marked as taking for the 10/07/20 encounter (Appointment) with Ledora Bottcher, [place].     Allergies:   Ciprofloxacin, Codeine, Flexeril [cyclobenzaprine], Imdur [isosorbide nitrate], Methocarbamol, Other, Strawberry extract, Sulfa antibiotics, Sulfamethoxazole, Sulfonamide derivatives, Tomato, and Flagyl [metronidazole]   Social History   Socioeconomic History  . Marital status: Married    Spouse name: Not on file  . Number of children: Not on file  . Years of education: Not on file  . Highest education level: Not on file  Occupational History  . Occupation: retired    Comment: Actor [employer]  Tobacco Use  . Smoking status: Former Smoker    Packs/day: 2.50    Years: 30.00    Pack years: 75.00    Types: Cigarettes    Quit date: 08/28/1985    Years since quitting: 35.1  . Smokeless tobacco: Never Used  Substance and Sexual Activity  . Alcohol use: Yes    Alcohol/week: 2.0 standard drinks    Types: 2 Standard drinks or equivalent per week    Comment: 1 glass wine/week (prior 1 glass martini with dinner)  . Drug use:  No  . Sexual activity: Yes    Birth control/protection: None  Other Topics Concern  . Not on file  Social History Narrative   Lives with wife--recently diagnosed with breast ca; No smoking; Occassional wine; Retired; 2 daughters live in [place]--5 grandchildren(boys). On weight watchers. Lives in [place] with wife. Retired Solicitor. Tobacco history 2 ppd x 32 years. No smoking x 25 years. No drugs.   Social Determinants of Health   Financial Resource Strain: Not on file  Food Insecurity: Not on file  Transportation Needs: Not on file  Physical Activity: Not on file  Stress: Not on file  Social Connections: Not on file     Family History: The patient's ***family history includes CAD in his child; Dementia in his sister; Hashimoto's thyroiditis in his child; Heart attack in  his sister; Heart disease in his father, mother, and sister; Kidney failure in his father; Sjogren's syndrome in his child.  ROS:   Please see the history of present illness.    *** All other systems reviewed and are negative.  EKGs/Labs/Other Studies Reviewed:    The following studies were reviewed today: ***  EKG:  EKG is *** ordered today.  The ekg ordered today demonstrates ***  Recent Labs: 06/19/2020: TSH 2.66 09/09/2020: B Natriuretic Peptide 117.3; Hemoglobin 13.3; Magnesium 1.8; Platelets 111 09/10/2020: ALT 51; BUN 15; Creatinine, Ser 1.58; Potassium 3.9; Sodium 136  Recent Lipid Panel    Component Value Date/Time   CHOL 155 06/19/2020 1103   CHOL 153 07/09/2017 0938   TRIG 257.0 (H) 06/19/2020 1103   HDL 32.00 (L) 06/19/2020 1103   HDL 38 (L) 07/09/2017 0938   CHOLHDL 5 06/19/2020 1103   VLDL 51.4 (H) 06/19/2020 1103   LDLCALC 77 09/14/2018 1117   LDLCALC 75 07/09/2017 0938   LDLDIRECT 95.0 06/19/2020 1103    Physical Exam:    VS:  There were no vitals taken for this visit.    Wt Readings from Last 3 Encounters:  09/18/20 218 lb 6.4 oz (99.1 kg)  09/09/20 218 lb 4.1 oz  (99 kg)  07/25/20 218 lb 0.6 oz (98.9 kg)     GEN: *** Well nourished, well developed in no acute distress HEENT: Normal NECK: No JVD; No carotid bruits LYMPHATICS: No lymphadenopathy CARDIAC: ***RRR, no murmurs, rubs, gallops RESPIRATORY:  Clear to auscultation without rales, wheezing or rhonchi  ABDOMEN: Soft, non-tender, non-distended MUSCULOSKELETAL:  No edema; No deformity  SKIN: Warm and dry NEUROLOGIC:  Alert and oriented x 3 PSYCHIATRIC:  Normal affect   ASSESSMENT:    No diagnosis found. PLAN:    In order of problems listed above:  No diagnosis found.   Medication Adjustments/Labs and Tests Ordered: Current medicines are reviewed at length with the patient today.  Concerns regarding medicines are outlined above.  No orders of the defined types were placed in this encounter.  No orders of the defined types were placed in this encounter.   Signed, Ledora Bottcher, [place]  10/01/2020 10:40 AM    [facility]

## 2020-10-07 ENCOUNTER — Ambulatory Visit: Payer: Medicare Other | Admitting: Physician Assistant

## 2020-10-11 ENCOUNTER — Other Ambulatory Visit: Payer: Self-pay

## 2020-10-11 ENCOUNTER — Encounter (HOSPITAL_COMMUNITY): Payer: Self-pay

## 2020-10-11 ENCOUNTER — Ambulatory Visit (HOSPITAL_COMMUNITY)
Admission: RE | Admit: 2020-10-11 | Discharge: 2020-10-11 | Disposition: A | Discharge status: Home / Self Care | Payer: Medicare Other | Source: Ambulatory Visit | Attending: Family Medicine | Admitting: Family Medicine

## 2020-10-11 NOTE — Discharge Instructions (Addendum)
I would strongly encourage you to go to the Emergency Department for further evaluation of your 10/10 abdominal pain.

## 2020-10-11 NOTE — ED Triage Notes (Signed)
Pt presents with abdominal pan x 1 week. Reports pain is worse in the left lower quadrant.

## 2020-10-11 NOTE — ED Provider Notes (Signed)
Patient brought in by for evaluation of abdominal pain that has been going on for the approximately 1 week.  Diffuse abdominal pain that is 10/10 per patient report.  Patient requesting antibiotic for possible diverticulitis.  Reports history of diverticulitis but states this pain is significantly worse than episodes in the past.  Pain is diffuse across the abdomen but also radiates to back and bilateral sides.  Based on severity of pain and patient history it is recommended the patient go to the emergency room for further evaluation at a higher level of care.  Patient and wife report that they went to the emergency room initially thinking that they had set up in the appointment to be seen before sent here.  Stating that he just wants to go home and take a pain pill follow-up tomorrow.  It was stressed the importance of being evaluated today based on symptoms.  Wife verbalized understanding but patient not agreeable to plan.  Wife reports that she will discuss options with husband in car.     Pearson Forster, NP 10/11/20 1452

## 2020-10-18 ENCOUNTER — Telehealth: Payer: Self-pay | Admitting: Family Medicine

## 2020-10-18 ENCOUNTER — Emergency Department (HOSPITAL_BASED_OUTPATIENT_CLINIC_OR_DEPARTMENT_OTHER)
Admission: EM | Admit: 2020-10-18 | Discharge: 2020-10-18 | Disposition: A | Discharge status: Home / Self Care | Payer: Medicare Other | Attending: Emergency Medicine | Admitting: Emergency Medicine

## 2020-10-18 ENCOUNTER — Other Ambulatory Visit: Payer: Self-pay

## 2020-10-18 ENCOUNTER — Encounter (HOSPITAL_BASED_OUTPATIENT_CLINIC_OR_DEPARTMENT_OTHER): Payer: Self-pay | Admitting: *Deleted

## 2020-10-18 ENCOUNTER — Emergency Department (HOSPITAL_BASED_OUTPATIENT_CLINIC_OR_DEPARTMENT_OTHER): Payer: Medicare Other

## 2020-10-18 DIAGNOSIS — Z7901 Long term (current) use of anticoagulants: Secondary | ICD-10-CM | POA: Diagnosis not present

## 2020-10-18 DIAGNOSIS — J45909 Unspecified asthma, uncomplicated: Secondary | ICD-10-CM | POA: Insufficient documentation

## 2020-10-18 DIAGNOSIS — Z79899 Other long term (current) drug therapy: Secondary | ICD-10-CM | POA: Insufficient documentation

## 2020-10-18 DIAGNOSIS — I129 Hypertensive chronic kidney disease with stage 1 through stage 4 chronic kidney disease, or unspecified chronic kidney disease: Secondary | ICD-10-CM | POA: Diagnosis not present

## 2020-10-18 DIAGNOSIS — N200 Calculus of kidney: Secondary | ICD-10-CM | POA: Diagnosis not present

## 2020-10-18 DIAGNOSIS — I480 Paroxysmal atrial fibrillation: Secondary | ICD-10-CM | POA: Diagnosis not present

## 2020-10-18 DIAGNOSIS — E1122 Type 2 diabetes mellitus with diabetic chronic kidney disease: Secondary | ICD-10-CM | POA: Insufficient documentation

## 2020-10-18 DIAGNOSIS — R109 Unspecified abdominal pain: Secondary | ICD-10-CM | POA: Diagnosis present

## 2020-10-18 DIAGNOSIS — Z7984 Long term (current) use of oral hypoglycemic drugs: Secondary | ICD-10-CM | POA: Insufficient documentation

## 2020-10-18 DIAGNOSIS — E039 Hypothyroidism, unspecified: Secondary | ICD-10-CM | POA: Insufficient documentation

## 2020-10-18 DIAGNOSIS — N1832 Chronic kidney disease, stage 3b: Secondary | ICD-10-CM | POA: Diagnosis not present

## 2020-10-18 DIAGNOSIS — Z87891 Personal history of nicotine dependence: Secondary | ICD-10-CM | POA: Insufficient documentation

## 2020-10-18 DIAGNOSIS — I2510 Atherosclerotic heart disease of native coronary artery without angina pectoris: Secondary | ICD-10-CM | POA: Insufficient documentation

## 2020-10-18 LAB — CBC WITH DIFFERENTIAL/PLATELET
Abs Immature Granulocytes: 0.33 10*3/uL — ABNORMAL HIGH (ref 0.00–0.07)
Basophils Absolute: 0.1 10*3/uL (ref 0.0–0.1)
Basophils Relative: 2 %
Eosinophils Absolute: 0.3 10*3/uL (ref 0.0–0.5)
Eosinophils Relative: 7 %
HCT: 41.8 % (ref 39.0–52.0)
Hemoglobin: 14.6 g/dL (ref 13.0–17.0)
Immature Granulocytes: 7 %
Lymphocytes Relative: 13 %
Lymphs Abs: 0.7 10*3/uL (ref 0.7–4.0)
MCH: 35.1 pg — ABNORMAL HIGH (ref 26.0–34.0)
MCHC: 34.9 g/dL (ref 30.0–36.0)
MCV: 100.5 fL — ABNORMAL HIGH (ref 80.0–100.0)
Monocytes Absolute: 0.6 10*3/uL (ref 0.1–1.0)
Monocytes Relative: 12 %
Neutro Abs: 3.1 10*3/uL (ref 1.7–7.7)
Neutrophils Relative %: 59 %
Platelets: 153 10*3/uL (ref 150–400)
RBC: 4.16 MIL/uL — ABNORMAL LOW (ref 4.22–5.81)
RDW: 14.5 % (ref 11.5–15.5)
WBC: 5.1 10*3/uL (ref 4.0–10.5)
nRBC: 0 % (ref 0.0–0.2)

## 2020-10-18 LAB — COMPREHENSIVE METABOLIC PANEL
ALT: 37 U/L (ref 0–44)
AST: 38 U/L (ref 15–41)
Albumin: 4 g/dL (ref 3.5–5.0)
Alkaline Phosphatase: 68 U/L (ref 38–126)
Anion gap: 10 (ref 5–15)
BUN: 22 mg/dL (ref 8–23)
CO2: 25 mmol/L (ref 22–32)
Calcium: 9.6 mg/dL (ref 8.9–10.3)
Chloride: 101 mmol/L (ref 98–111)
Creatinine, Ser: 1.63 mg/dL — ABNORMAL HIGH (ref 0.61–1.24)
GFR, Estimated: 41 mL/min — ABNORMAL LOW (ref >60)
Glucose, Bld: 146 mg/dL — ABNORMAL HIGH (ref 70–99)
Potassium: 4.2 mmol/L (ref 3.5–5.1)
Sodium: 136 mmol/L (ref 135–145)
Total Bilirubin: 1.2 mg/dL (ref 0.3–1.2)
Total Protein: 7.5 g/dL (ref 6.5–8.1)

## 2020-10-18 LAB — URINALYSIS, ROUTINE W REFLEX MICROSCOPIC
Bilirubin Urine: NEGATIVE
Glucose, UA: >=500 mg/dL — AB
Ketones, ur: NEGATIVE mg/dL
Leukocytes,Ua: NEGATIVE
Nitrite: NEGATIVE
Protein, ur: NEGATIVE mg/dL
Specific Gravity, Urine: 1.025 (ref 1.005–1.030)
pH: 5 (ref 5.0–8.0)

## 2020-10-18 LAB — URINALYSIS, MICROSCOPIC (REFLEX)
Squamous Epithelial / HPF: NONE SEEN (ref 0–5)
WBC, UA: NONE SEEN WBC/hpf (ref 0–5)

## 2020-10-18 LAB — LIPASE, BLOOD: Lipase: 33 U/L (ref 11–51)

## 2020-10-18 MED ORDER — OXYCODONE-ACETAMINOPHEN 5-325 MG PO TABS
1.0000 | ORAL_TABLET | Freq: Once | ORAL | Status: AC
  Administered 2020-10-18: 1 via ORAL
  Filled 2020-10-18: qty 1

## 2020-10-18 MED ORDER — OXYCODONE-ACETAMINOPHEN 5-325 MG PO TABS: 1.0000 | ORAL_TABLET | Freq: Three times a day (TID) | ORAL | 0 refills | Status: DC | PRN

## 2020-10-18 MED ORDER — IOHEXOL 300 MG/ML  SOLN
100.0000 mL | Freq: Once | INTRAMUSCULAR | Status: AC | PRN
  Administered 2020-10-18: 80 mL via INTRAVENOUS

## 2020-10-18 NOTE — Telephone Encounter (Signed)
FYI   Patient called stating that he is going to the ED, patient is having abd pain.

## 2020-10-18 NOTE — ED Triage Notes (Signed)
C/o severe abd pain  X 1 week

## 2020-10-18 NOTE — Discharge Instructions (Addendum)
When we discussed, I am going to prescribe you Percocet for your pain.  Please only take this as prescribed. I would recommend taking ibuprofen or tylenol for your pain throughout the day and only take this medication for breakthrough pain that you cannot control. This medication also has tylenol in it, so please be sure you are not taking more than 3000 mg of tylenol per day. Do not drive or operate heavy machinery after taking this medication. Do not mix it with alcohol.   Please continue to monitor your symptoms.  If you develop any worsening nausea, vomiting, fevers, chills, difficulty urinating, burning when you pee, please immediately return to the emergency department for reevaluation.  Otherwise, please follow-up with Dr. Lovena Neighbours next week.  It was a pleasure to meet you both.

## 2020-10-18 NOTE — ED Provider Notes (Signed)
[facility] EMERGENCY DEPARTMENT Provider Note   CSN: [id number] Arrival date & time: 10/18/20  1309     History Chief Complaint  Patient presents with  . Abdominal Pain    [name] is a 87 y.o. male.  HPI Patient is an 87 year old male with a medical history as noted below.  He presents the emergency department due to abdominal pain.  Patient states his symptoms have been intermittent for about 2 weeks.  Initially they were left-sided, they subsided and then his symptoms started in the central abdomen and now radiate to the right lateral abdomen.  Reports associated constipation.  His last normal bowel movement was yesterday.  Also reports an episode of nausea earlier today but no vomiting.  No diarrhea.  No fevers, chills, chest pain, shortness of breath, urinary complaints.    Past Medical History:  Diagnosis Date  . Anginal pain ([place])   . Anxiety    " OCCASIONAL"  . Arthritis   . Asthma due to seasonal allergies    uses inhalers prn  . Carotid artery occlusion    Left  . Complication of anesthesia    " had tremors after prostate surgery  . Coronary artery disease   . Diverticulitis    recurrent  . Family history of anesthesia complication    MOTHER   . GERD (gastroesophageal reflux disease)   . H/O hiatal hernia   . Hearing aid worn   . Hyperlipemia   . Hypertension   . Kidney stone    lithotripsy 4132 w complications, req stents, Dr Rosana Hoes  . Prostate CA ([place])   . Shortness of breath   . Thyroid disease   . TIA (transient ischemic attack)   . Tinnitus     Patient Active Problem List   Diagnosis Date Noted  . Uncontrolled type 2 diabetes mellitus with hyperglycemia, without long-term current use of insulin ([place]) 09/09/2020  . Chest pain 09/08/2020  . Acute diverticulitis 02/07/2019  . Angina pectoris, variant ([place]) 05/19/2018  . Poor compliance with CPAP treatment 04/20/2018  . Excessive daytime sleepiness 03/22/2018  . Poor sleep hygiene  03/22/2018  . Ventral hernia without obstruction or gangrene 10/09/2017  . TIA (transient ischemic attack)   . Kidney stone   . Hypertension   . Hearing aid worn   . Coronary artery disease involving native coronary artery of native heart   . Asthma due to seasonal allergies   . Arthritis   . Erosive gastropathy 03/26/2017  . Acute blood loss anemia 03/25/2017  . Melena 03/24/2017  . Chronic kidney disease, stage 3b ([place]) 03/24/2017  . AF (paroxysmal atrial fibrillation) ([place]) 03/24/2017  . Angina pectoris ([place]) 10/09/2015  . Essential hypertension   . Hyperlipemia   . Carotid artery occlusion   . Dizziness 08/09/2015  . Gait instability 05/30/2015  . Hypothyroidism, acquired 10/26/2013  . OSA (obstructive sleep apnea) 10/18/2013  . Benign localized hyperplasia of prostate with urinary obstruction and other lower urinary tract symptoms (LUTS)(600.21) 12/21/2012  . Carcinoma in situ of prostate 12/21/2012  . Left shoulder pain 08/08/2012  . Class 1 obesity due to excess calories with serious comorbidity and body mass index (BMI) of 34.0 to 34.9 in adult 07/24/2011  . Generalized anxiety disorder 07/24/2011  . DIVERTICULOSIS OF COLON 12/25/2009  . DERMATITIS, SEBORRHEIC 12/25/2009  . 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 Memory loss 08/14/2009  . VENOUS INSUFFICIENCY, CHRONIC 09/09/2006  . GERD without esophagitis 09/09/2006  . HERNIA, HIATAL, NONCONGENITAL 09/09/2006  .  INSOMNIA NOS 09/09/2006    Past Surgical History:  Procedure Laterality Date  . CARDIAC CATHETERIZATION  2008   minimal dz, Dr Cathie Olden  . CARDIAC CATHETERIZATION N/A 10/09/2015   Procedure: Left Heart Cath and Coronary Angiography;  Surgeon: Peter M Martinique, MD;  Location: [facility];  Service: Cardiovascular;  Laterality: N/A;  . CARDIAC CATHETERIZATION N/A 10/09/2015   Procedure: Intravascular Pressure Wire/FFR Study;  Surgeon: Peter M Martinique, MD;  Location: [facility];  Service:  Cardiovascular;  Laterality: N/A;  . CARDIAC CATHETERIZATION N/A 10/09/2015   Procedure: Coronary Stent Intervention;  Surgeon: Peter M Martinique, MD;  Location: [facility];  Service: Cardiovascular;  Laterality: N/A;  . CAROTID ENDARTERECTOMY  Left   1998  . CHOLECYSTECTOMY    . CORONARY STENT PLACEMENT  10/09/2015   DES to RCA  . ESOPHAGOGASTRODUODENOSCOPY (EGD) WITH PROPOFOL Left 03/26/2017   Procedure: ESOPHAGOGASTRODUODENOSCOPY (EGD) WITH PROPOFOL;  Surgeon: Arta Silence, MD;  Location: [facility];  Service: Endoscopy;  Laterality: Left;  . LEFT HEART CATHETERIZATION WITH CORONARY ANGIOGRAM N/A 09/15/2013   Procedure: LEFT HEART CATHETERIZATION WITH CORONARY ANGIOGRAM;  Surgeon: Peter M Martinique, MD;  Location: [facility];  Service: Cardiovascular;  Laterality: N/A;  . PROSTATECTOMY  1998   radical for prostate cancer       Family History  Problem Relation Age of Onset  . Heart disease Mother   . Heart disease Father   . Kidney failure Father   . Heart disease Sister   . Heart attack Sister   . Dementia Sister   . Sjogren's syndrome Child   . Hashimoto's thyroiditis Child   . CAD Child     Social History   Tobacco Use  . Smoking status: Former Smoker    Packs/day: 2.50    Years: 30.00    Pack years: 75.00    Types: Cigarettes    Quit date: 08/28/1985    Years since quitting: 35.1  . Smokeless tobacco: Never Used  Substance Use Topics  . Alcohol use: Yes    Alcohol/week: 2.0 standard drinks    Types: 2 Standard drinks or equivalent per week    Comment: 1 glass wine/week (prior 1 glass martini with dinner)  . Drug use: No    Home Medications Prior to Admission medications   Medication Sig Start Date End Date Taking? Authorizing Provider  oxyCODONE-acetaminophen (PERCOCET/ROXICET) 5-325 MG tablet Take 1 tablet by mouth every 8 (eight) hours as needed for severe pain. 10/18/20  Yes Rayna Sexton, PA-C  Accu-Chek Softclix Lancets lancets USE AS DIRECTED UP  TO 4 TIMES DAILY 09/10/20 09/10/21  Eulogio Bear U, DO  albuterol (VENTOLIN HFA) 108 (90 Base) MCG/ACT inhaler Inhale 2 puffs into the lungs every 6 (six) hours as needed for wheezing or shortness of breath. 08/18/17   Copland, Gay Filler, MD  alfuzosin (UROXATRAL) 10 MG 24 hr tablet Take 10 mg by mouth daily. 07/02/20   [provider]  ARMOUR THYROID 120 MG tablet TAKE 1 TABLET BY MOUTH ONCE DAILY Patient taking differently: Take 120 mg by mouth at bedtime. 05/23/20   Copland, Gay Filler, MD  blood glucose meter kit and supplies KIT Dispense based on patient and insurance preference. Use up to four times daily as directed. 09/10/20   Geradine Girt, DO  Blood Glucose Monitoring Suppl (ACCU-CHEK GUIDE) w/Device KIT USE AS DIRECTE UP TO 4 TIMES DAILY 09/10/20 09/10/21  Eulogio Bear U, DO  carvedilol (COREG) 6.25 MG tablet  TAKE 1 TABLET BY MOUTH ONE TO TWO TIMES DAILY 10/03/19   Copland, Gay Filler, MD  clobetasol (TEMOVATE) 0.05 % external solution Apply 1 application topically daily as needed (itching on scalp). 10/06/17   [provider]  dapagliflozin propanediol (FARXIGA) 5 MG TABS tablet Take 1 tablet (5 mg total) by mouth daily before breakfast. 09/18/20   Copland, Gay Filler, MD  ezetimibe (ZETIA) 10 MG tablet TAKE 1 TABLET BY MOUTH EVERY DAY 09/27/20   Martinique, Peter M, MD  glucose blood test strip USE UP TO FOUR TIMES DAILY AS DIRECTED. 09/10/20 09/10/21  Eulogio Bear U, DO  hydrocortisone 2.5 % cream Apply 1 application topically 2 (two) times daily as needed (itching).    [provider]  ipratropium (ATROVENT) 0.03 % nasal spray Place 2 sprays into the nose 4 (four) times daily. Patient taking differently: Place 2 sprays into the nose 2 (two) times daily. 03/03/19   Copland, Gay Filler, MD  linagliptin (TRADJENTA) 5 MG TABS tablet TAKE 1 TABLET (5 MG TOTAL) BY MOUTH DAILY. 09/10/20 09/10/21  Geradine Girt, DO  losartan (COZAAR) 50 MG tablet Take 0.5 tablets (25 mg total) by mouth daily.  09/10/20   Geradine Girt, DO  Naphazoline-Pheniramine (OPCON-A) 0.027-0.315 % SOLN Place 1-2 drops into both eyes 3 (three) times daily as needed (for itching, redness, and/or irritation).    [provider]  nitroGLYCERIN (NITROLINGUAL) 0.4 MG/SPRAY spray Place 1 spray under the tongue every 5 (five) minutes x 3 doses as needed for chest pain. 07/18/18   Martinique, Peter M, MD  sennosides-docusate sodium (SENOKOT-S) 8.6-50 MG tablet Take 1 tablet by mouth daily.    [provider]  sitaGLIPtin (JANUVIA) 50 MG tablet TAKE 1 TABLET (50 MG TOTAL) BY MOUTH DAILY. 09/10/20 09/10/21  Geradine Girt, DO  tamsulosin (FLOMAX) 0.4 MG CAPS capsule Take 0.4 mg by mouth daily. 09/16/20   [provider]  triamcinolone cream (KENALOG) 0.1 % Apply 1 application topically daily as needed (for rashes).     [provider]  XARELTO 15 MG TABS tablet TAKE 1 TABLET (15 MG TOTAL) BY MOUTH DAILY WITH SUPPER. 09/17/20   Martinique, Peter M, MD    Allergies    Ciprofloxacin, Codeine, Flexeril [cyclobenzaprine], Imdur [isosorbide nitrate], Methocarbamol, Other, Strawberry extract, Sulfa antibiotics, Sulfamethoxazole, Sulfonamide derivatives, Tomato, and Flagyl [metronidazole]  Review of Systems   Review of Systems  All other systems reviewed and are negative. Ten systems reviewed and are negative for acute change, except as noted in the HPI.   Physical Exam Updated Vital Signs BP (!) 190/104   Pulse 84   Temp 98 F (36.7 C)   Resp 16   Ht _0  (1.676 m)   Wt 99.3 kg   SpO2 99%   BMI 35.35 kg/m   Physical Exam Vitals and nursing note reviewed.  Constitutional:      General: He is not in acute distress.    Appearance: Normal appearance. He is well-developed. He is not ill-appearing, toxic-appearing or diaphoretic.  HENT:     Head: Normocephalic and atraumatic.     Right Ear: External ear normal.     Left Ear: External ear normal.     Nose: Nose normal.     Mouth/Throat:     Mouth:  Mucous membranes are moist.     Pharynx: Oropharynx is clear. No oropharyngeal exudate or posterior oropharyngeal erythema.  Eyes:     Extraocular Movements: Extraocular movements intact.  Cardiovascular:  Rate and Rhythm: Normal rate and regular rhythm.     Pulses: Normal pulses.     Heart sounds: Normal heart sounds. No murmur heard. No friction rub. No gallop.   Pulmonary:     Effort: Pulmonary effort is normal. No respiratory distress.     Breath sounds: Normal breath sounds. No stridor. No wheezing, rhonchi or rales.  Abdominal:     General: Abdomen is flat and protuberant. There is no distension.     Tenderness: There is generalized abdominal tenderness. There is no right CVA tenderness or left CVA tenderness.     Comments: Protuberant abdomen.  Abdomen is soft.  Diffuse abdominal tenderness that appears to be worst along the central right lateral abdomen.  No CVA tenderness.  Musculoskeletal:        General: Normal range of motion.     Cervical back: Normal range of motion and neck supple. No tenderness.  Skin:    General: Skin is warm and dry.  Neurological:     General: No focal deficit present.     Mental Status: He is alert and oriented to person, place, and time.  Psychiatric:        Mood and Affect: Mood normal.        Behavior: Behavior normal.    ED Results / Procedures / Treatments   Labs (all labs ordered are listed, but only abnormal results are displayed) Labs Reviewed  CBC WITH DIFFERENTIAL/PLATELET - Abnormal; Notable for the following components:      Result Value   RBC 4.16 (*)    MCV 100.5 (*)    MCH 35.1 (*)    Abs Immature Granulocytes 0.33 (*)    All other components within normal limits  COMPREHENSIVE METABOLIC PANEL - Abnormal; Notable for the following components:   Glucose, Bld 146 (*)    Creatinine, Ser 1.63 (*)    GFR, Estimated 41 (*)    All other components within normal limits  URINALYSIS, ROUTINE W REFLEX MICROSCOPIC - Abnormal;  Notable for the following components:   Glucose, UA >=500 (*)    Hgb urine dipstick MODERATE (*)    All other components within normal limits  URINALYSIS, MICROSCOPIC (REFLEX) - Abnormal; Notable for the following components:   Bacteria, UA RARE (*)    All other components within normal limits  LIPASE, BLOOD    EKG EKG Interpretation  Date/Time:  Friday October 18 2020 14:26:47 EDT Ventricular Rate:  92 PR Interval:  150 QRS Duration: 128 QT Interval:  378 QTC Calculation: 468 R Axis:   62 Text Interpretation: Sinus rhythm Atrial premature complex Right bundle branch block No significant change since prior 2/22 Confirmed by Aletta Edouard [phone]) on 10/18/2020 2:35:49 PM   Radiology CT ABDOMEN PELVIS W CONTRAST  Result Date: 10/18/2020 CLINICAL DATA:  Nonlocalized acute abdominal pain.  Unable to void. EXAM: CT ABDOMEN AND PELVIS WITH CONTRAST TECHNIQUE: Multidetector CT imaging of the abdomen and pelvis was performed using the standard protocol following bolus administration of intravenous contrast. CONTRAST:  97m OMNIPAQUE IOHEXOL 300 MG/ML  SOLN COMPARISON:  CT abdomen pelvis 02/23/2019, CT abdomen pelvis 09/06/2020 FINDINGS: Lower chest: No acute abnormality. Severe mitral annular calcification. Coronary artery calcifications. Hepatobiliary: The hepatic parenchyma is diffusely hypodense compared to the splenic parenchyma consistent with fatty infiltration. No focal liver abnormality. Status post cholecystectomy no biliary dilatation. Pancreas: No focal lesion. Normal pancreatic contour. No surrounding inflammatory changes. No main pancreatic ductal dilatation. Spleen: Normal in size. Wedge-shaped hypodensity within the  spleen measuring up to approximately 4 cm. Adrenals/Urinary Tract: Interval development of moderate right hydroureteronephrosis with a 3 mm calcified stone within the proximal to mid right ureter. A second 4 mm calcified stone is noted at the right ureterovesicular junction  with associated mild right hydroureter proximally. Question associated urothelial thickening. No left hydroureteronephrosis. No left urinary for lithiasis. No focal renal lesion. Question trace perivesicular fat stranding. Stomach/Bowel: Stomach is within normal limits. No evidence of bowel wall thickening or dilatation. Diffuse colonic diverticulosis. Appendix appears normal. Vascular/Lymphatic: No abdominal aorta or iliac aneurysm. Severe calcified and noncalcified atherosclerotic plaque of the aorta and its branches. No abdominal, pelvic, or inguinal lymphadenopathy. Reproductive: Prostatectomy. Other: No intraperitoneal free fluid. No intraperitoneal free gas. No organized fluid collection. Musculoskeletal: No abdominal wall hernia or abnormality. No suspicious lytic or blastic osseous lesions. No acute displaced fracture. Multilevel degenerative changes of the spine. IMPRESSION: 1. Obstructive 3 mm proximal to mid right ureter as well as 4 mm right ureterovesicular calcified stones. Recommend correlation with urinalysis for superimposed infection. 2. Interval development of splenic wedge-shaped density could represent infarction. 3. Hepatic steatosis. 4. Diffuse colonic diverticulosis with no acute diverticulitis. 5. Aortic Atherosclerosis (ICD10-I70.0) including coronary artery calcifications and severe mitral annular calcification. Electronically Signed   By: Iven Finn M.D.   On: 10/18/2020 16:12   Procedures Procedures   Medications Ordered in ED Medications  oxyCODONE-acetaminophen (PERCOCET/ROXICET) 5-325 MG per tablet 1 tablet (1 tablet Oral Given 10/18/20 1428)  iohexol (OMNIPAQUE) 300 MG/ML solution 100 mL (80 mLs Intravenous Contrast Given 10/18/20 1540)   ED Course  I have reviewed the triage vital signs and the nursing notes.  Pertinent labs & imaging results that were available during my care of the patient were reviewed by me and considered in my medical decision making (see chart  for details).  Clinical Course as of 10/18/20 1704  Fri Oct 18, 6573  [id number] 87 year old male here with sounds like multiple weeks of abdominal pain.  He said it started on the left and that went away and now it is on the right.  He is not the best historian.  His wife is helping him.  Getting some labs urinalysis and will need a CAT scan abdomen and pelvis.  Disposition per results of testing. [MB]    Clinical Course User Index [MB] Hayden Rasmussen, MD   MDM Rules/Calculators/A&P                          Pt is a 87 y.o. male who presents the emergency department due to abdominal pain.  Labs: CBC with RBCs of 4.16, MCV of 100.5, MCH of 35.1. CMP with a glucose of 146, creatinine of 1.63, GFR of 41.  Kidney function appears stable. UA shows rare bacteria, glucosuria greater than 500, moderate hemoglobin. Lipase of 33.  Imaging: CT scan of the abdomen and pelvis shows an obstructing 3 mm proximal to mid right ureter as well as 4 mm right ureterovesicular calcified stones.  Recommend correlation with UA for superimposed infection.  Interval development of splenic wedge-shaped density which could represent infarction.  Hepatic steatosis.  Diffuse colonic diverticulosis with no acute diverticulitis.  I, Rayna Sexton, PA-C, personally reviewed and evaluated these images and lab results as part of my medical decision-making.  Patient appears to have a 4 mm and 3 mm kidney stone on the right side.  Likely the cause of patient's pain today.  UA shows rare bacteria  but is otherwise benign.  Patient is having no urinary complaints.  Doubt superimposed infection.  Patient has a sulfa allergy so cannot prescribe Flomax.  Given his kidney function, do not feel that NSAIDs are reasonable.  Will discharge on a course of Percocet.  He is followed by Dr. Lovena Neighbours with urology.  Recommended that he follow-up with Dr. Lovena Neighbours on Monday.  This was discussed with his wife as well as bedside who confirmed  understanding of the above plan.  Feel that he is stable for discharge at this time.  He was given very strict return precautions and understands to return to the ED if he develops any new or worsening symptoms.  Plan was discussed with my attending physician Dr. Blanchie Dessert who is in agreement.  Patient's questions were answered and he was amicable to time of discharge.  Note: Portions of this report may have been transcribed using voice recognition software. Every effort was made to ensure accuracy; however, inadvertent computerized transcription errors may be present.   Final Clinical Impression(s) / ED Diagnoses Final diagnoses:  Kidney stone   Rx / DC Orders ED Discharge Orders         Ordered    oxyCODONE-acetaminophen (PERCOCET/ROXICET) 5-325 MG tablet  Every 8 hours PRN        10/18/20 1659           Rayna Sexton, PA-C 10/18/20 1705    Hayden Rasmussen, MD 10/18/20 1919

## 2020-11-10 ENCOUNTER — Other Ambulatory Visit: Payer: Self-pay

## 2020-11-10 ENCOUNTER — Encounter (HOSPITAL_BASED_OUTPATIENT_CLINIC_OR_DEPARTMENT_OTHER): Payer: Self-pay

## 2020-11-10 ENCOUNTER — Emergency Department (HOSPITAL_BASED_OUTPATIENT_CLINIC_OR_DEPARTMENT_OTHER)
Admission: EM | Admit: 2020-11-10 | Discharge: 2020-11-10 | Disposition: A | Discharge status: Home / Self Care | Payer: Medicare Other | Attending: Emergency Medicine | Admitting: Emergency Medicine

## 2020-11-10 ENCOUNTER — Emergency Department (HOSPITAL_BASED_OUTPATIENT_CLINIC_OR_DEPARTMENT_OTHER): Payer: Medicare Other

## 2020-11-10 DIAGNOSIS — Z79899 Other long term (current) drug therapy: Secondary | ICD-10-CM | POA: Insufficient documentation

## 2020-11-10 DIAGNOSIS — E1165 Type 2 diabetes mellitus with hyperglycemia: Secondary | ICD-10-CM | POA: Diagnosis not present

## 2020-11-10 DIAGNOSIS — Z7901 Long term (current) use of anticoagulants: Secondary | ICD-10-CM | POA: Diagnosis not present

## 2020-11-10 DIAGNOSIS — R079 Chest pain, unspecified: Secondary | ICD-10-CM | POA: Diagnosis present

## 2020-11-10 DIAGNOSIS — N1832 Chronic kidney disease, stage 3b: Secondary | ICD-10-CM | POA: Diagnosis not present

## 2020-11-10 DIAGNOSIS — E039 Hypothyroidism, unspecified: Secondary | ICD-10-CM | POA: Insufficient documentation

## 2020-11-10 DIAGNOSIS — J45909 Unspecified asthma, uncomplicated: Secondary | ICD-10-CM | POA: Diagnosis not present

## 2020-11-10 DIAGNOSIS — I129 Hypertensive chronic kidney disease with stage 1 through stage 4 chronic kidney disease, or unspecified chronic kidney disease: Secondary | ICD-10-CM | POA: Diagnosis not present

## 2020-11-10 DIAGNOSIS — Z87891 Personal history of nicotine dependence: Secondary | ICD-10-CM | POA: Insufficient documentation

## 2020-11-10 DIAGNOSIS — Z7984 Long term (current) use of oral hypoglycemic drugs: Secondary | ICD-10-CM | POA: Diagnosis not present

## 2020-11-10 DIAGNOSIS — I2510 Atherosclerotic heart disease of native coronary artery without angina pectoris: Secondary | ICD-10-CM | POA: Diagnosis not present

## 2020-11-10 DIAGNOSIS — Z955 Presence of coronary angioplasty implant and graft: Secondary | ICD-10-CM | POA: Diagnosis not present

## 2020-11-10 LAB — CBC
HCT: 41.5 % (ref 39.0–52.0)
Hemoglobin: 14.6 g/dL (ref 13.0–17.0)
MCH: 36.3 pg — ABNORMAL HIGH (ref 26.0–34.0)
MCHC: 35.2 g/dL (ref 30.0–36.0)
MCV: 103.2 fL — ABNORMAL HIGH (ref 80.0–100.0)
Platelets: 128 10*3/uL — ABNORMAL LOW (ref 150–400)
RBC: 4.02 MIL/uL — ABNORMAL LOW (ref 4.22–5.81)
RDW: 14.4 % (ref 11.5–15.5)
WBC: 5.5 10*3/uL (ref 4.0–10.5)
nRBC: 0 % (ref 0.0–0.2)

## 2020-11-10 LAB — TROPONIN I (HIGH SENSITIVITY)
Troponin I (High Sensitivity): 15 ng/L (ref <18)
Troponin I (High Sensitivity): 16 ng/L (ref <18)

## 2020-11-10 LAB — BASIC METABOLIC PANEL
Anion gap: 9 (ref 5–15)
BUN: 21 mg/dL (ref 8–23)
CO2: 25 mmol/L (ref 22–32)
Calcium: 9.5 mg/dL (ref 8.9–10.3)
Chloride: 102 mmol/L (ref 98–111)
Creatinine, Ser: 1.69 mg/dL — ABNORMAL HIGH (ref 0.61–1.24)
GFR, Estimated: 39 mL/min — ABNORMAL LOW (ref >60)
Glucose, Bld: 106 mg/dL — ABNORMAL HIGH (ref 70–99)
Potassium: 4.1 mmol/L (ref 3.5–5.1)
Sodium: 136 mmol/L (ref 135–145)

## 2020-11-10 MED ORDER — OMEPRAZOLE 20 MG PO CPDR: 20.0000 mg | DELAYED_RELEASE_CAPSULE | Freq: Two times a day (BID) | ORAL | 0 refills | Status: DC

## 2020-11-10 NOTE — ED Provider Notes (Signed)
[facility] EMERGENCY DEPARTMENT Provider Note   CSN: [id number] Arrival date & time: 11/10/20  1218     History Chief Complaint  Patient presents with  . Chest Pain  . Arm Pain    [name] is a 87 y.o. male with past medical history of CAD s/p cardiac catheterization in 2017 s/p RCA stent, HTN, CKD, GERD, paroxysmal atrial fibrillation, OSA, HTN, and HLD recently discharged from [facility] 09/09/2020 after admission for chest pain.  I reviewed his medical record and during that hospitalization he was found to have new onset diabetes with hyperglycemia.  His troponins were flat and consistent with an acute coronary syndrome.  Echocardiogram was reassuring.  Only mild aortic stenosis was noted.  Encouraged to have close outpatient cardiology follow-up.  On my examination, patient reports that this morning shortly after waking up he developed left-sided chest pain that radiated towards his left arm.  He described it as sharp, waxing and waning.  He states that he took a sublingual nitroglycerin and it did not provide him any relief.  He states that this did not feel like his typical cardiac pain.  He thought that perhaps it was related to anxiety.  He did take his usual aspirin.  By the time I examined him, he states that his pain had already resolved.  It started at approximately 8 AM and ended around 1 PM, shortly after he came to the hospital.  He suspects that the reason why it improved is because he knew that he was in good hands and that in turn caused his anxiety to improve.    Patient denied any associated shortness of breath, diaphoresis, cough, fevers or chills, nausea or vomiting, or any other symptoms.    Patient had CT obtained 10/18/2020 which revealed obstructing ureterolithiasis.  He denies any ongoing flank or abdominal pain.  He was noted to have distended abdomen at that time.  He continues to have bloating on my examination.  Endorses early satiety and burping  frequently.  HPI   Past Medical History:  Diagnosis Date  . Anginal pain ([place])   . Anxiety    " OCCASIONAL"  . Arthritis   . Asthma due to seasonal allergies    uses inhalers prn  . Carotid artery occlusion    Left  . Complication of anesthesia    " had tremors after prostate surgery  . Coronary artery disease   . Diverticulitis    recurrent  . Family history of anesthesia complication    MOTHER   . GERD (gastroesophageal reflux disease)   . H/O hiatal hernia   . Hearing aid worn   . Hyperlipemia   . Hypertension   . Kidney stone    lithotripsy 1324 w complications, req stents, Dr Rosana Hoes  . Prostate CA ([place])   . Shortness of breath   . Thyroid disease   . TIA (transient ischemic attack)   . Tinnitus     Patient Active Problem List   Diagnosis Date Noted  . Uncontrolled type 2 diabetes mellitus with hyperglycemia, without long-term current use of insulin ([place]) 09/09/2020  . Chest pain 09/08/2020  . Acute diverticulitis 02/07/2019  . Angina pectoris, variant ([place]) 05/19/2018  . Poor compliance with CPAP treatment 04/20/2018  . Excessive daytime sleepiness 03/22/2018  . Poor sleep hygiene 03/22/2018  . Ventral hernia without obstruction or gangrene 10/09/2017  . TIA (transient ischemic attack)   . Kidney stone   . Hypertension   . Hearing  aid worn   . Coronary artery disease involving native coronary artery of native heart   . Asthma due to seasonal allergies   . Arthritis   . Erosive gastropathy 03/26/2017  . Acute blood loss anemia 03/25/2017  . Melena 03/24/2017  . Chronic kidney disease, stage 3b ([place]) 03/24/2017  . AF (paroxysmal atrial fibrillation) ([place]) 03/24/2017  . Angina pectoris ([place]) 10/09/2015  . Essential hypertension   . Hyperlipemia   . Carotid artery occlusion   . Dizziness 08/09/2015  . Gait instability 05/30/2015  . Hypothyroidism, acquired 10/26/2013  . OSA (obstructive sleep apnea) 10/18/2013  . Benign localized hyperplasia of  prostate with urinary obstruction and other lower urinary tract symptoms (LUTS)(600.21) 12/21/2012  . Carcinoma in situ of prostate 12/21/2012  . Left shoulder pain 08/08/2012  . Class 1 obesity due to excess calories with serious comorbidity and body mass index (BMI) of 34.0 to 34.9 in adult 07/24/2011  . Generalized anxiety disorder 07/24/2011  . DIVERTICULOSIS OF COLON 12/25/2009  . DERMATITIS, SEBORRHEIC 12/25/2009  . 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 Memory loss 08/14/2009  . VENOUS INSUFFICIENCY, CHRONIC 09/09/2006  . GERD without esophagitis 09/09/2006  . HERNIA, HIATAL, NONCONGENITAL 09/09/2006  . INSOMNIA NOS 09/09/2006    Past Surgical History:  Procedure Laterality Date  . CARDIAC CATHETERIZATION  2008   minimal dz, Dr Cathie Olden  . CARDIAC CATHETERIZATION N/A 10/09/2015   Procedure: Left Heart Cath and Coronary Angiography;  Surgeon: Peter M Martinique, MD;  Location: [facility] CV LAB;  Service: Cardiovascular;  Laterality: N/A;  . CARDIAC CATHETERIZATION N/A 10/09/2015   Procedure: Intravascular Pressure Wire/FFR Study;  Surgeon: Peter M Martinique, MD;  Location: [facility] LAB;  Service: Cardiovascular;  Laterality: N/A;  . CARDIAC CATHETERIZATION N/A 10/09/2015   Procedure: Coronary Stent Intervention;  Surgeon: Peter M Martinique, MD;  Location: [facility] CV LAB;  Service: Cardiovascular;  Laterality: N/A;  . CAROTID ENDARTERECTOMY  Left   1998  . CHOLECYSTECTOMY    . CORONARY STENT PLACEMENT  10/09/2015   DES to RCA  . ESOPHAGOGASTRODUODENOSCOPY (EGD) WITH PROPOFOL Left 03/26/2017   Procedure: ESOPHAGOGASTRODUODENOSCOPY (EGD) WITH PROPOFOL;  Surgeon: Arta Silence, MD;  Location: [facility] ENDOS[facility];  Service: Endoscopy;  Laterality: Left;  . LEFT HEART CATHETERIZATION WITH CORONARY ANGIOGRAM N/A 09/15/2013   Procedure: LEFT HEART CATHETERIZATION WITH CORONARY ANGIOGRAM;  Surgeon: Peter M Martinique, MD;  Location[facility] CATH LAB;  Service: Cardiovascular;  Laterality: N/A;  .  PROSTATECTOMY  1998   radical for prostate cancer       Family History  Problem Relation Age of Onset  . Heart disease Mother   . Heart disease Father   . Kidney failure Father   . Heart disease Sister   . Heart attack Sister   . Dementia Sister   . Sjogren's syndrome Child   . Hashimoto's thyroiditis Child   . CAD Child     Social History   Tobacco Use  . Smoking status: Former Smoker    Packs/day: 2.50    Years: 30.00    Pack years: 75.00    Types: Cigarettes    Quit date: 08/28/1985    Years since quitting: 35.2  . Smokeless tobacco: Never Used  Substance Use Topics  . Alcohol use: Yes    Alcohol/week: 2.0 standard drinks    Types: 2 Standard drinks or equivalent per week    Comment: 1 glass wine/week (prior 1 glass martini with dinner)  . Drug use: No  Home Medications Prior to Admission medications   Medication Sig Start Date End Date Taking? Authorizing Provider  Accu-Chek Softclix Lancets lancets USE AS DIRECTED UP TO 4 TIMES DAILY 09/10/20 09/10/21  Eulogio Bear U, DO  albuterol (VENTOLIN HFA) 108 (90 Base) MCG/ACT inhaler Inhale 2 puffs into the lungs every 6 (six) hours as needed for wheezing or shortness of breath. 08/18/17   Copland, Gay Filler, MD  alfuzosin (UROXATRAL) 10 MG 24 hr tablet Take 10 mg by mouth daily. 07/02/20   [provider]  ARMOUR THYROID 120 MG tablet TAKE 1 TABLET BY MOUTH ONCE DAILY Patient taking differently: Take 120 mg by mouth at bedtime. 05/23/20   Copland, Gay Filler, MD  blood glucose meter kit and supplies KIT Dispense based on patient and insurance preference. Use up to four times daily as directed. 09/10/20   Geradine Girt, DO  Blood Glucose Monitoring Suppl (ACCU-CHEK GUIDE) w/Device KIT USE AS DIRECTE UP TO 4 TIMES DAILY 09/10/20 09/10/21  Eulogio Bear U, DO  carvedilol (COREG) 6.25 MG tablet TAKE 1 TABLET BY MOUTH ONE TO TWO TIMES DAILY 10/03/19   Copland, Gay Filler, MD  clobetasol (TEMOVATE) 0.05 % external solution  Apply 1 application topically daily as needed (itching on scalp). 10/06/17   [provider]  dapagliflozin propanediol (FARXIGA) 5 MG TABS tablet Take 1 tablet (5 mg total) by mouth daily before breakfast. 09/18/20   Copland, Gay Filler, MD  ezetimibe (ZETIA) 10 MG tablet TAKE 1 TABLET BY MOUTH EVERY DAY 09/27/20   Martinique, Peter M, MD  glucose blood test strip USE UP TO FOUR TIMES DAILY AS DIRECTED. 09/10/20 09/10/21  Eulogio Bear U, DO  hydrocortisone 2.5 % cream Apply 1 application topically 2 (two) times daily as needed (itching).    [provider]  ipratropium (ATROVENT) 0.03 % nasal spray Place 2 sprays into the nose 4 (four) times daily. Patient taking differently: Place 2 sprays into the nose 2 (two) times daily. 03/03/19   Copland, Gay Filler, MD  linagliptin (TRADJENTA) 5 MG TABS tablet TAKE 1 TABLET (5 MG TOTAL) BY MOUTH DAILY. 09/10/20 09/10/21  Geradine Girt, DO  losartan (COZAAR) 50 MG tablet Take 0.5 tablets (25 mg total) by mouth daily. 09/10/20   Geradine Girt, DO  Naphazoline-Pheniramine (OPCON-A) 0.027-0.315 % SOLN Place 1-2 drops into both eyes 3 (three) times daily as needed (for itching, redness, and/or irritation).    [provider]  nitroGLYCERIN (NITROLINGUAL) 0.4 MG/SPRAY spray Place 1 spray under the tongue every 5 (five) minutes x 3 doses as needed for chest pain. 07/18/18   Martinique, Peter M, MD  oxyCODONE-acetaminophen (PERCOCET/ROXICET) 5-325 MG tablet Take 1 tablet by mouth every 8 (eight) hours as needed for severe pain. 10/18/20   Rayna Sexton, PA-C  sennosides-docusate sodium (SENOKOT-S) 8.6-50 MG tablet Take 1 tablet by mouth daily.    [provider]  sitaGLIPtin (JANUVIA) 50 MG tablet TAKE 1 TABLET (50 MG TOTAL) BY MOUTH DAILY. 09/10/20 09/10/21  Geradine Girt, DO  tamsulosin (FLOMAX) 0.4 MG CAPS capsule Take 0.4 mg by mouth daily. 09/16/20   [provider]  triamcinolone cream (KENALOG) 0.1 % Apply 1 application topically daily as  needed (for rashes).     [provider]  XARELTO 15 MG TABS tablet TAKE 1 TABLET (15 MG TOTAL) BY MOUTH DAILY WITH SUPPER. 09/17/20   Martinique, Peter M, MD    Allergies    Ciprofloxacin, Codeine, Flexeril [cyclobenzaprine], Imdur [isosorbide nitrate], Methocarbamol,  Other, [place]raw[place] extract, Sulfa antibiotics, Sulfamethoxazole, Sulfonamide derivatives, Tomato, and Flagyl [metronidazole]  Review of Systems   Review of Systems  All other systems reviewed and are negative.   Physical Exam Updated Vital Signs BP 138/88 (BP Location: Left Arm)   Pulse 72   Temp 98.1 F (36.7 C) (Oral)   Resp 18   Ht 5' 6"  (1.676 m)   Wt 99.8 kg   SpO2 99%   BMI 35.51 kg/m   Physical Exam Vitals and nursing note reviewed. Exam conducted with a chaperone present.  Constitutional:      Appearance: Normal appearance.  HENT:     Head: Normocephalic and atraumatic.  Eyes:     General: No scleral icterus.    Conjunctiva/sclera: Conjunctivae normal.  Cardiovascular:     Rate and Rhythm: Normal rate.     Pulses: Normal pulses.  Pulmonary:     Effort: Pulmonary effort is normal. No respiratory distress.  Abdominal:     General: Abdomen is flat.     Palpations: Abdomen is soft.     Tenderness: There is no abdominal tenderness.     Comments: Soft abdomen, bloated.  No significant tenderness.  No overlying skin changes.  No masses appreciated.  Musculoskeletal:        General: Normal range of motion.  Skin:    General: Skin is dry.  Neurological:     Mental Status: He is alert and oriented to person, place, and time.     GCS: GCS eye subscore is 4. GCS verbal subscore is 5. GCS motor subscore is 6.  Psychiatric:        Mood and Affect: Mood normal.        Behavior: Behavior normal.        Thought Content: Thought content normal.     ED Results / Procedures / Treatments   Labs (all labs ordered are listed, but only abnormal results are displayed) Labs Reviewed  BASIC METABOLIC PANEL  - Abnormal; Notable for the following components:      Result Value   Glucose, Bld 106 (*)    Creatinine, Ser 1.69 (*)    GFR, Estimated 39 (*)    All other components within normal limits  CBC - Abnormal; Notable for the following components:   RBC 4.02 (*)    MCV 103.2 (*)    MCH 36.3 (*)    Platelets 128 (*)    All other components within normal limits  TROPONIN I (HIGH SENSITIVITY)  TROPONIN I (HIGH SENSITIVITY)    EKG EKG Interpretation  Date/Time:  Sunday Nov 10 2020 12:31:35 EDT Ventricular Rate:  68 PR Interval:  140 QRS Duration: 116 QT Interval:  418 QTC Calculation: 444 R Axis:   45 Text Interpretation: Sinus rhythm with Premature supraventricular complexes Incomplete right bundle branch block Nonspecific ST and T wave abnormality Abnormal ECG similar Confirmed by Dewaine Conger [phone]) on 11/10/2020 5:07:12 PM   Radiology DG Chest 2 View  Result Date: 11/10/2020 CLINICAL DATA:  Chest pain EXAM: CHEST - 2 VIEW COMPARISON:  Chest x-ray dated 09/08/2020. FINDINGS: Heart size and mediastinal contours are within normal limits. Lungs are hyperexpanded. Chronic bronchitic changes noted centrally. Lungs otherwise clear. No pleural effusion or pneumothorax is seen. No acute appearing osseous abnormality. IMPRESSION: 1. No active cardiopulmonary disease. No evidence of pneumonia or pulmonary edema. 2. Hyperexpanded lungs indicating COPD. Associated chronic bronchitic changes centrally. Electronically Signed   By: Franki Cabot M.D.   On: 11/10/2020 13:49  Procedures Procedures   Medications Ordered in ED Medications - No data to display  ED Course  I have reviewed the triage vital signs and the nursing notes.  Pertinent labs & imaging results that were available during my care of the patient were reviewed by me and considered in my medical decision making (see chart for details).    MDM Rules/Calculators/A&P HEAR Score: 5                        [name] was  evaluated in Emergency Department on 11/10/2020 for the symptoms described in the history of present illness. He was evaluated in the context of the global COVID-19 pandemic, which necessitated consideration that the patient might be at risk for infection with the SARS-CoV-2 virus that causes COVID-19. Institutional protocols and algorithms that pertain to the evaluation of patients at risk for COVID-19 are in a state of rapid change based on information released by regulatory bodies including the CDC and federal and state organizations. These policies and algorithms were followed during the patient's care in the ED.  I personally reviewed patient's medical chart and all notes from triage and staff during today's encounter. I have also ordered and reviewed all labs and imaging that I felt to be medically necessary in the evaluation of this patient's complaints and with consideration of their physical exam. If needed, translation services were available and utilized.   Patient with chest pain this morning that he thought was anxiety.  He came to the ED and states that it resolved shortly thereafter.  He has elevated HEAR score 5, troponins trended x2 and stable.  EKG without changes when compared to prior tracings.  BMP shows stable creatinine.  CBC also unremarkable.  Chest x-ray is personally reviewed and without any acute cardiopulmonary disease.  While patient has extensive cardiac history, he had recently been admitted to the hospital and had full cardiac work-up which was reassuring.  He states that this does not feel like his prior ACS and his symptoms would be very atypical for ACS.  While patient does feel bloated, he denies any inability to pass stool.  No vomiting.  He has history of diverticular disease, but abdominal exam is largely benign.  Denies fevers or illness.  Do not feel as though CT imaging is warranted.  He does feel mildly bloated with diminished appetite recently and eructation.   Will prescribe proton pump inhibitors.  Encouraged him to follow-up with his primary care provider and cardiologist for ongoing evaluation and management.  Discussed case with Dr. Ron Parker who agrees with assessment and plan.  Final Clinical Impression(s) / ED Diagnoses Final diagnoses:  Nonspecific chest pain    Rx / DC Orders ED Discharge Orders    None       Corena Herter, PA-C 11/10/20 1715    Breck Coons, MD 11/11/20 [phone]

## 2020-11-10 NOTE — ED Triage Notes (Signed)
Pt c/o left arm/shoulder pain this morning with intermittent pain to mid chest. Denies injury to arm/SOB

## 2020-11-10 NOTE — ED Notes (Signed)
This am develop throbbing pain in left shoulder and left arm, pain then radiated to center of chest. Placed on cont cardiac monitoring with cont POX and int NBP assessment

## 2020-11-10 NOTE — Discharge Instructions (Addendum)
Please follow-up with your primary care provider as well as your cardiology team regarding today's ED encounter for ongoing evaluation and management.  I have started you on omeprazole 20 mg twice daily.  Please take, as directed.  This may help with your burping and indigestion.  Continue with Gas-X, as needed.  Return to the ER or seek immediate medical attention should you experience any new or worsening symptoms.

## 2020-11-15 ENCOUNTER — Other Ambulatory Visit: Payer: Self-pay | Admitting: Family Medicine

## 2020-11-15 DIAGNOSIS — J300 Vasomotor rhinitis: Secondary | ICD-10-CM

## 2020-11-18 ENCOUNTER — Other Ambulatory Visit: Payer: Self-pay | Admitting: Family Medicine

## 2020-12-10 ENCOUNTER — Other Ambulatory Visit: Payer: Self-pay | Admitting: Family Medicine

## 2020-12-10 DIAGNOSIS — J300 Vasomotor rhinitis: Secondary | ICD-10-CM

## 2021-03-16 ENCOUNTER — Other Ambulatory Visit: Payer: Self-pay | Admitting: Family Medicine

## 2021-03-16 DIAGNOSIS — J300 Vasomotor rhinitis: Secondary | ICD-10-CM

## 2021-04-15 ENCOUNTER — Other Ambulatory Visit: Payer: Self-pay | Admitting: Family Medicine

## 2021-04-15 DIAGNOSIS — J300 Vasomotor rhinitis: Secondary | ICD-10-CM

## 2021-04-18 LAB — BASIC METABOLIC PANEL
BUN: 17 (ref 4–21)
CO2: 30 — AB (ref 13–22)
Chloride: 103 (ref 99–108)
Creatinine: 1.7 — AB (ref 0.6–1.3)
Potassium: 4.4 (ref 3.4–5.3)
Sodium: 140 (ref 137–147)

## 2021-04-18 LAB — COMPREHENSIVE METABOLIC PANEL: GFR calc non Af Amer: 40

## 2021-04-30 ENCOUNTER — Other Ambulatory Visit: Payer: Self-pay | Admitting: Family Medicine

## 2021-04-30 DIAGNOSIS — E119 Type 2 diabetes mellitus without complications: Secondary | ICD-10-CM

## 2021-05-18 ENCOUNTER — Other Ambulatory Visit: Payer: Self-pay | Admitting: Family Medicine

## 2021-08-14 ENCOUNTER — Other Ambulatory Visit: Payer: Self-pay | Admitting: Family Medicine

## 2021-08-14 DIAGNOSIS — J300 Vasomotor rhinitis: Secondary | ICD-10-CM

## 2021-08-23 ENCOUNTER — Other Ambulatory Visit: Payer: Self-pay | Admitting: Family Medicine

## 2021-08-23 NOTE — Progress Notes (Unsigned)
[place] at [facility] [address], [place], [place] [address] [phone]  Date:  08/25/2021   Name:  [name]   DOB:  1934-03-24   MRN:  [id number]  PCP:  [name], MD    Chief Complaint: No chief complaint on file.   History of Present Illness:  [name] is a 88 y.o. very pleasant male patient who presents with the following:  Pt seen today for periodic follow-up visit Last seen by myself about one year ago  History of hypertension, TIA, CAD, paroxysmal atrial fib, carotid artery occlusion status post endarterectomy, sleep apnea, chronic renal disease, hyperlipidemia, memory loss, hard of hearing, prostate cancer status post radical prostatectomy in 1998, hypothyroidism-also chronic stable angina  Covid series Foot exam due Eye exam Shingrix Tetanus booster  Flu Can update labs today  Patient Active Problem List   Diagnosis Date Noted   Uncontrolled type 2 diabetes mellitus with hyperglycemia, without long-term current use of insulin ([place]) 09/09/2020   Chest pain 09/08/2020   Acute diverticulitis 02/07/2019   Angina pectoris, variant ([place]) 05/19/2018   Poor compliance with CPAP treatment 04/20/2018   Excessive daytime sleepiness 03/22/2018   Poor sleep hygiene 03/22/2018   Ventral hernia without obstruction or gangrene 10/09/2017   TIA (transient ischemic attack)    Kidney stone    Hypertension    Hearing aid worn    Coronary artery disease involving native coronary artery of native heart    Asthma due to seasonal allergies    Arthritis    Erosive gastropathy 03/26/2017   Acute blood loss anemia 03/25/2017   Melena 03/24/2017   Chronic kidney disease, stage 3b ([place]) 03/24/2017   AF (paroxysmal atrial fibrillation) ([place]) 03/24/2017   Angina pectoris ([place]) 10/09/2015   Essential hypertension    Hyperlipemia    Carotid artery occlusion    Dizziness 08/09/2015   Gait instability 05/30/2015    Hypothyroidism, acquired 10/26/2013   OSA (obstructive sleep apnea) 10/18/2013   Benign localized hyperplasia of prostate with urinary obstruction and other lower urinary tract symptoms (LUTS)(600.21) 12/21/2012   Carcinoma in situ of prostate 12/21/2012   Left shoulder pain 08/08/2012   Class 1 obesity due to excess calories with serious comorbidity and body mass index (BMI) of 34.0 to 34.9 in adult 07/24/2011   Generalized anxiety disorder 07/24/2011   DIVERTICULOSIS OF COLON 12/25/2009   DERMATITIS, SEBORRHEIC 12/25/2009   Noise-induced hearing loss 12/18/2009   Memory loss 08/14/2009   VENOUS INSUFFICIENCY, CHRONIC 09/09/2006   GERD without esophagitis 09/09/2006   HERNIA, HIATAL, NONCONGENITAL 09/09/2006   INSOMNIA NOS 09/09/2006    Past Medical History:  Diagnosis Date   Anginal pain ([place])    Anxiety    " OCCASIONAL"   Arthritis    Asthma due to seasonal allergies    uses inhalers prn   Carotid artery occlusion    Left   Complication of anesthesia    " had tremors after prostate surgery   Coronary artery disease    Diverticulitis    recurrent   Family history of anesthesia complication    MOTHER    GERD (gastroesophageal reflux disease)    H/O hiatal hernia    Hearing aid worn    Hyperlipemia    Hypertension    Kidney stone    lithotripsy 1700 w complications, req stents, Dr Rosana Hoes   Prostate CA [facility])    Shortness of breath  Thyroid disease    TIA (transient ischemic attack)    Tinnitus     Past Surgical History:  Procedure Laterality Date   CARDIAC CATHETERIZATION  2008   minimal dz, Dr Cathie Olden   CARDIAC CATHETERIZATION N/A 10/09/2015   Procedure: Left Heart Cath and Coronary Angiography;  Surgeon: Peter M Martinique, MD;  Location: [facility];  Service: Cardiovascular;  Laterality: N/A;   CARDIAC CATHETERIZATION N/A 10/09/2015   Procedure: Intravascular Pressure Wire/FFR Study;  Surgeon: Peter M Martinique, MD;  Location: [place];  Service:  Cardiovascular;  Laterality: N/A;   CARDIAC CATHETERIZATION N/A 10/09/2015   Procedure: Coronary Stent Intervention;  Surgeon: Peter M Martinique, MD;  Location: [facility];  Service: Cardiovascular;  Laterality: N/A;   CAROTID ENDARTERECTOMY  Left   1998   CHOLECYSTECTOMY     CORONARY STENT PLACEMENT  10/09/2015   DES to RCA   ESOPHAGOGASTRODUODENOSCOPY (EGD) WITH PROPOFOL Left 03/26/2017   Procedure: ESOPHAGOGASTRODUODENOSCOPY (EGD) WITH PROPOFOL;  Surgeon: Arta Silence, MD;  Location: [facility];  Service: Endoscopy;  Laterality: Left;   LEFT HEART CATHETERIZATION WITH CORONARY ANGIOGRAM N/A 09/15/2013   Procedure: LEFT HEART CATHETERIZATION WITH CORONARY ANGIOGRAM;  Surgeon: Peter M Martinique, MD;  Location: [facility] CATH LAB;  Service: Cardiovascular;  Laterality: N/A;   PROSTATECTOMY  1998   radical for prostate cancer    Social History   Tobacco Use   Smoking status: Former    Packs/day: 2.50    Years: 30.00    Pack years: 75.00    Types: Cigarettes    Quit date: 08/28/1985    Years since quitting: 36.0   Smokeless tobacco: Never  Substance Use Topics   Alcohol use: Yes    Alcohol/week: 2.0 standard drinks    Types: 2 Standard drinks or equivalent per week    Comment: 1 glass wine/week (prior 1 glass martini with dinner)   Drug use: No    Family History  Problem Relation Age of Onset   Heart disease Mother    Heart disease Father    Kidney failure Father    Heart disease Sister    Heart attack Sister    Dementia Sister    Sjogren's syndrome Child    Hashimoto's thyroiditis Child    CAD Child     Allergies  Allergen Reactions   Ciprofloxacin Other (See Comments)    Hallucinations or jitteriness   Codeine Other (See Comments)    Hallucinations    Flexeril [Cyclobenzaprine] Other (See Comments)    "Made me feel goofy"   Imdur [Isosorbide Nitrate] Other (See Comments)    Caused headaches   Methocarbamol Other (See Comments)    "Made me feel goofy"   Other  Other (See Comments)    CANNOT HAVE ANY FOODS WITH SEEDS    Strawberry Extract Other (See Comments)    CANNOT HAVE ANY FOODS WITH SEEDS   Sulfa Antibiotics Other (See Comments)    Chills and shaking- "serum sickness"    Sulfamethoxazole Other (See Comments)    Chills and shaking- "serum sickness"   Sulfonamide Derivatives Other (See Comments)    Chills and shaking "serum sickness"   Tomato Other (See Comments)    CANNOT HAVE ANY FOODS WITH SEEDS   Flagyl [Metronidazole] Rash    Medication list has been reviewed and updated.  Current Outpatient Medications on File Prior to Visit  Medication Sig Dispense Refill   Accu-Chek Softclix Lancets lancets USE AS DIRECTED UP TO 4 TIMES  DAILY 100 each 0   albuterol (VENTOLIN HFA) 108 (90 Base) MCG/ACT inhaler Inhale 2 puffs into the lungs every 6 (six) hours as needed for wheezing or shortness of breath. 18 g 5   alfuzosin (UROXATRAL) 10 MG 24 hr tablet Take 10 mg by mouth daily.     blood glucose meter kit and supplies KIT Dispense based on patient and insurance preference. Use up to four times daily as directed. 1 each 0   Blood Glucose Monitoring Suppl (ACCU-CHEK GUIDE) w/Device KIT USE AS DIRECTE UP TO 4 TIMES DAILY 1 kit 0   carvedilol (COREG) 6.25 MG tablet TAKE 1 TABLET BY MOUTH ONE TO TWO TIMES DAILY 180 tablet 1   clobetasol (TEMOVATE) 0.05 % external solution Apply 1 application topically daily as needed (itching on scalp).  3   dapagliflozin propanediol (FARXIGA) 5 MG TABS tablet TAKE 1 TABLET BY MOUTH DAILY BEFORE BREAKFAST. 30 tablet 12   ezetimibe (ZETIA) 10 MG tablet TAKE 1 TABLET BY MOUTH EVERY DAY 90 tablet 1   glucose blood test strip USE UP TO FOUR TIMES DAILY AS DIRECTED. 100 strip 0   hydrocortisone 2.5 % cream Apply 1 application topically 2 (two) times daily as needed (itching).     ipratropium (ATROVENT) 0.03 % nasal spray PLACE 2 SPRAYS INTO THE NOSE 2 (TWO) TIMES DAILY. 30 mL 1   linagliptin (TRADJENTA) 5 MG TABS tablet  TAKE 1 TABLET (5 MG TOTAL) BY MOUTH DAILY. 30 tablet 1   losartan (COZAAR) 50 MG tablet Take 0.5 tablets (25 mg total) by mouth daily.     Naphazoline-Pheniramine (OPCON-A) 0.027-0.315 % SOLN Place 1-2 drops into both eyes 3 (three) times daily as needed (for itching, redness, and/or irritation).     nitroGLYCERIN (NITROLINGUAL) 0.4 MG/SPRAY spray Place 1 spray under the tongue every 5 (five) minutes x 3 doses as needed for chest pain. 12 g 5   omeprazole (PRILOSEC) 20 MG capsule Take 1 capsule (20 mg total) by mouth 2 (two) times daily before a meal. 60 capsule 0   oxyCODONE-acetaminophen (PERCOCET/ROXICET) 5-325 MG tablet Take 1 tablet by mouth every 8 (eight) hours as needed for severe pain. 8 tablet 0   sennosides-docusate sodium (SENOKOT-S) 8.6-50 MG tablet Take 1 tablet by mouth daily.     sitaGLIPtin (JANUVIA) 50 MG tablet TAKE 1 TABLET (50 MG TOTAL) BY MOUTH DAILY. 30 tablet 0   tamsulosin (FLOMAX) 0.4 MG CAPS capsule Take 0.4 mg by mouth daily.     thyroid (ARMOUR THYROID) 120 MG tablet TAKE 1 TABLET BY MOUTH EVERY DAY 90 tablet 0   triamcinolone cream (KENALOG) 0.1 % Apply 1 application topically daily as needed (for rashes).      XARELTO 15 MG TABS tablet TAKE 1 TABLET (15 MG TOTAL) BY MOUTH DAILY WITH SUPPER. 90 tablet 1   No current facility-administered medications on file prior to visit.    Review of Systems:  As per HPI- otherwise negative.   Physical Examination: There were no vitals filed for this visit. There were no vitals filed for this visit. There is no height or weight on file to calculate BMI. Ideal Body Weight:    GEN: no acute distress. HEENT: Atraumatic, Normocephalic.  Ears and Nose: No external deformity. CV: RRR, No M/G/R. No JVD. No thrill. No extra heart sounds. PULM: CTA B, no wheezes, crackles, rhonchi. No retractions. No resp. distress. No accessory muscle use. ABD: S, NT, ND, +BS. No rebound. No HSM. EXTR: No c/c/e PSYCH:  Normally interactive.  Conversant.    Assessment and Plan: ***  Signed Lamar Blinks, MD

## 2021-08-25 ENCOUNTER — Ambulatory Visit: Payer: Medicare Other | Admitting: Family Medicine

## 2021-08-25 DIAGNOSIS — E119 Type 2 diabetes mellitus without complications: Secondary | ICD-10-CM

## 2021-08-25 DIAGNOSIS — Z8546 Personal history of malignant neoplasm of prostate: Secondary | ICD-10-CM

## 2021-08-25 DIAGNOSIS — E039 Hypothyroidism, unspecified: Secondary | ICD-10-CM

## 2021-08-25 DIAGNOSIS — I10 Essential (primary) hypertension: Secondary | ICD-10-CM

## 2021-08-25 DIAGNOSIS — R413 Other amnesia: Secondary | ICD-10-CM

## 2021-08-25 DIAGNOSIS — N183 Chronic kidney disease, stage 3 unspecified: Secondary | ICD-10-CM

## 2021-09-09 ENCOUNTER — Other Ambulatory Visit: Payer: Self-pay | Admitting: Family Medicine

## 2021-09-09 DIAGNOSIS — J300 Vasomotor rhinitis: Secondary | ICD-10-CM

## 2021-10-08 NOTE — Patient Instructions (Addendum)
Good to see you again today- I will be in touch with your labs asap ?Please consider getting shingles series, tetanus booster, and covid series/ booster at your pharmacy if not done yet  ? ?We will plan to get a CT scan of your abdomen for your concern of pain- I just need to get your labs back before we can schedule this ? ?We will also set up an ultrasound of your carotid arteries  ?

## 2021-10-08 NOTE — Progress Notes (Addendum)
Therapist, music at [facility] ?[address], [address] ?[place], [place] [address] ?336 [phone] ?Fax [phone] ? ?Date:  10/13/2021  ? ?Name:  [name]   DOB:  1934/05/13   MRN:  [id number] ? ?PCP:  [name], MD  ? ? ?Chief Complaint: Memory Loss (Concerns/ questions: 1. kidney issues, 2. diabetes) ? ? ?History of Present Illness: ? ?[name] is a 88 y.o. very pleasant male patient who presents with the following: ? ?Patient seen today for periodic follow-up ?Most recent visit with myself about 1 year ago ? ?History of hypertension, TIA, CAD, paroxysmal atrial fib, carotid artery occlusion status post endarterectomy, sleep apnea, chronic renal disease, hyperlipidemia, memory loss, hard of hearing, prostate cancer status post radical prostatectomy in 1998, hypothyroidism-also chronic stable angina ? ?Covid series: Recommended ?Foot exam- do today  ?Eye exam- appt in 2 weeks  ?Shingrix-recommended ?Tetanus booster may be needed - pt declines  ?Due for labs today  ?Psa-he is not currently seeing urology, will check PSA today ? ?They note he is napping all day long, sleeping off and on at night ?He enjoys watching TV especially sports ?He notes pain in his right flank- it has been present for 6 months or so, will come and go  ?He did have kidney stones years ago ? ?He is nearly out of his thyroid med but still taking -will refill today ?He is seeing dermatology for various skin conditions ? ?They note he needs to urinate every 90 - 120 minutes; if he does not go he might have an accident  ? ?He notes he is "dizzy morning noon and night"- this is a feeling of being off balance  ?Not lightheaded  ?This is not a new issue ? ?Communication is challenging due to significant hearing loss-his wife helps quite a bit with the history ? ?Patient Active Problem List  ? Diagnosis Date Noted  ? Uncontrolled type 2 diabetes mellitus with hyperglycemia, without long-term current use of insulin ([place])  09/09/2020  ? Chest pain 09/08/2020  ? Acute diverticulitis 02/07/2019  ? Angina pectoris, variant ([place]) 05/19/2018  ? Poor compliance with CPAP treatment 04/20/2018  ? Excessive daytime sleepiness 03/22/2018  ? Poor sleep hygiene 03/22/2018  ? Ventral hernia without obstruction or gangrene 10/09/2017  ? TIA (transient ischemic attack)   ? Kidney stone   ? Hypertension   ? Hearing aid worn   ? Coronary artery disease involving native coronary artery of native heart   ? Asthma due to seasonal allergies   ? Arthritis   ? Erosive gastropathy 03/26/2017  ? Acute blood loss anemia 03/25/2017  ? Melena 03/24/2017  ? Chronic kidney disease, stage 3b ([place]) 03/24/2017  ? AF (paroxysmal atrial fibrillation) ([place]) 03/24/2017  ? Angina pectoris ([place]) 10/09/2015  ? Essential hypertension   ? Hyperlipemia   ? Carotid artery occlusion   ? Dizziness 08/09/2015  ? Gait instability 05/30/2015  ? Hypothyroidism, acquired 10/26/2013  ? OSA (obstructive sleep apnea) 10/18/2013  ? Benign localized hyperplasia of prostate with urinary obstruction and other lower urinary tract symptoms (LUTS)(600.21) 12/21/2012  ? Carcinoma in situ of prostate 12/21/2012  ? Left shoulder pain 08/08/2012  ? Class 1 obesity due to excess calories with serious comorbidity and body mass index (BMI) of 34.0 to 34.9 in adult 07/24/2011  ? Generalized anxiety disorder 07/24/2011  ? DIVERTICULOSIS OF COLON 12/25/2009  ? DERMATITIS, SEBORRHEIC 12/25/2009  ? Noise-induced hearing loss 12/18/2009  ? Memory  loss 08/14/2009  ? VENOUS INSUFFICIENCY, CHRONIC 09/09/2006  ? GERD without esophagitis 09/09/2006  ? HERNIA, HIATAL, NONCONGENITAL 09/09/2006  ? INSOMNIA NOS 09/09/2006  ? ? ?Past Medical History:  ?Diagnosis Date  ? Anginal pain ([place])   ? Anxiety   ? " OCCASIONAL"  ? Arthritis   ? Asthma due to seasonal allergies   ? uses inhalers prn  ? Carotid artery occlusion   ? Left  ? Complication of anesthesia   ? " had tremors after prostate surgery  ? Coronary artery  disease   ? Diverticulitis   ? recurrent  ? Family history of anesthesia complication   ? MOTHER   ? GERD (gastroesophageal reflux disease)   ? H/O hiatal hernia   ? Hearing aid worn   ? Hyperlipemia   ? Hypertension   ? Kidney stone   ? lithotripsy 4696 w complications, req stents, Dr Rosana Hoes  ? Prostate CA [facility])   ? Shortness of breath   ? Thyroid disease   ? TIA (transient ischemic attack)   ? Tinnitus   ? ? ?Past Surgical History:  ?Procedure Laterality Date  ? CARDIAC CATHETERIZATION  2008  ? minimal dz, Dr Cathie Olden  ? CARDIAC CATHETERIZATION N/A 10/09/2015  ? Procedure: Left Heart Cath and Coronary Angiography;  Surgeon: Peter M Martinique, MD;  Location: [facility];  Service: Cardiovascular;  Laterality: N/A;  ? CARDIAC CATHETERIZATION N/A 10/09/2015  ? Procedure: Intravascular Pressure Wire/FFR Study;  Surgeon: Peter M Martinique, MD;  Location: [facility];  Service: Cardiovascular;  Laterality: N/A;  ? CARDIAC CATHETERIZATION N/A 10/09/2015  ? Procedure: Coronary Stent Intervention;  Surgeon: Peter M Martinique, MD;  Location: [facility];  Service: Cardiovascular;  Laterality: N/A;  ? CAROTID ENDARTERECTOMY  Left  ? 1998  ? CHOLECYSTECTOMY    ? CORONARY STENT PLACEMENT  10/09/2015  ? DES to RCA  ? ESOPHAGOGASTRODUODENOSCOPY (EGD) WITH PROPOFOL Left 03/26/2017  ? Procedure: ESOPHAGOGASTRODUODENOSCOPY (EGD) WITH PROPOFOL;  Surgeon: Arta Silence, MD;  [facility];  Service: Endoscopy;  Laterality: Left;  ? LEFT HEART CATHETERIZATION WITH CORONARY ANGIOGRAM N/A 09/15/2013  ? Procedure: LEFT HEART CATHETERIZATION WITH CORONARY ANGIOGRAM;  Surgeon: Peter M Martinique, MD;  [facility] C[facility] LAB;  Service: Cardiovascular;  Laterality: N/A;  ? PROSTATECTOMY  1998  ? radical for prostate cancer  ? ? ?Social History  ? ?Tobacco Use  ? Smoking status: Former  ?  Packs/day: 2.50  ?  Years: 30.00  ?  Pack years: 75.00  ?  Types: Cigarettes  ?  Quit date: 08/28/1985  ?  Years since quitting: 36.1  ? Smokeless  tobacco: Never  ?Substance Use Topics  ? Alcohol use: Yes  ?  Alcohol/week: 2.0 standard drinks  ?  Types: 2 Standard drinks or equivalent per week  ?  Comment: 1 glass wine/week (prior 1 glass martini with dinner)  ? Drug use: No  ? ? ?Family History  ?Problem Relation Age of Onset  ? Heart disease Mother   ? Heart disease Father   ? Kidney failure Father   ? Heart disease Sister   ? Heart attack Sister   ? Dementia Sister   ? Sjogren's syndrome Child   ? Hashimoto's thyroiditis Child   ? CAD Child   ? ? ?Allergies  ?Allergen Reactions  ? Ciprofloxacin Other (See Comments)  ?  Hallucinations or jitteriness  ? Codeine Other (See Comments)  ?  Hallucinations ?  ? Flexeril [Cyclobenzaprine] Other (  See Comments)  ?  "Made me feel goofy"  ? Imdur [Isosorbide Nitrate] Other (See Comments)  ?  Caused headaches  ? Methocarbamol Other (See Comments)  ?  "Made me feel goofy"  ? Other Other (See Comments)  ?  CANNOT HAVE ANY FOODS WITH SEEDS ?  ? Strawberry Extract Other (See Comments)  ?  CANNOT HAVE ANY FOODS WITH SEEDS  ? Sulfa Antibiotics Other (See Comments)  ?  Chills and shaking- "serum sickness" ?  ? Sulfamethoxazole Other (See Comments)  ?  Chills and shaking- "serum sickness"  ? Sulfonamide Derivatives Other (See Comments)  ?  Chills and shaking "serum sickness"  ? Tomato Other (See Comments)  ?  CANNOT HAVE ANY FOODS WITH SEEDS  ? Flagyl [Metronidazole] Rash  ? ? ?Medication list has been reviewed and updated. ? ?Current Outpatient Medications on File Prior to Visit  ?Medication Sig Dispense Refill  ? blood glucose meter kit and supplies KIT Dispense based on patient and insurance preference. Use up to four times daily as directed. 1 each 0  ? clobetasol (TEMOVATE) 0.05 % external solution Apply 1 application topically daily as needed (itching on scalp).  3  ? dapagliflozin propanediol (FARXIGA) 5 MG TABS tablet TAKE 1 TABLET BY MOUTH DAILY BEFORE BREAKFAST. 30 tablet 12  ? ezetimibe (ZETIA) 10 MG tablet TAKE 1  TABLET BY MOUTH EVERY DAY 90 tablet 1  ? hydrocortisone 2.5 % cream Apply 1 application topically 2 (two) times daily as needed (itching).    ? ipratropium (ATROVENT) 0.03 % nasal spray PLACE 2 SPRAYS INTO

## 2021-10-13 ENCOUNTER — Ambulatory Visit: Payer: Medicare Other | Admitting: Family Medicine

## 2021-10-13 VITALS — BP 136/80 | HR 97 | Temp 97.8°F | Resp 18 | Ht 66.0 in | Wt 206.6 lb

## 2021-10-13 DIAGNOSIS — I10 Essential (primary) hypertension: Secondary | ICD-10-CM

## 2021-10-13 DIAGNOSIS — E785 Hyperlipidemia, unspecified: Secondary | ICD-10-CM

## 2021-10-13 DIAGNOSIS — N183 Chronic kidney disease, stage 3 unspecified: Secondary | ICD-10-CM

## 2021-10-13 DIAGNOSIS — D075 Carcinoma in situ of prostate: Secondary | ICD-10-CM | POA: Diagnosis not present

## 2021-10-13 DIAGNOSIS — E039 Hypothyroidism, unspecified: Secondary | ICD-10-CM

## 2021-10-13 DIAGNOSIS — E119 Type 2 diabetes mellitus without complications: Secondary | ICD-10-CM

## 2021-10-13 DIAGNOSIS — R350 Frequency of micturition: Secondary | ICD-10-CM

## 2021-10-13 DIAGNOSIS — I480 Paroxysmal atrial fibrillation: Secondary | ICD-10-CM

## 2021-10-13 DIAGNOSIS — R1011 Right upper quadrant pain: Secondary | ICD-10-CM

## 2021-10-13 DIAGNOSIS — H9193 Unspecified hearing loss, bilateral: Secondary | ICD-10-CM

## 2021-10-13 DIAGNOSIS — I6523 Occlusion and stenosis of bilateral carotid arteries: Secondary | ICD-10-CM

## 2021-10-13 MED ORDER — THYROID 120 MG PO TABS: 120.0000 mg | ORAL_TABLET | Freq: Every day | ORAL | 3 refills | Status: DC

## 2021-10-14 LAB — CBC
HCT: 45.2 % (ref 39.0–52.0)
Hemoglobin: 15.6 g/dL (ref 13.0–17.0)
MCHC: 34.6 g/dL (ref 30.0–36.0)
MCV: 106.4 fl — ABNORMAL HIGH (ref 78.0–100.0)
Platelets: 149 10*3/uL — ABNORMAL LOW (ref 150.0–400.0)
RBC: 4.25 Mil/uL (ref 4.22–5.81)
RDW: 14.8 % (ref 11.5–15.5)
WBC: 6.4 10*3/uL (ref 4.0–10.5)

## 2021-10-14 LAB — COMPREHENSIVE METABOLIC PANEL
ALT: 39 U/L (ref 0–53)
AST: 47 U/L — ABNORMAL HIGH (ref 0–37)
Albumin: 4.5 g/dL (ref 3.5–5.2)
Alkaline Phosphatase: 96 U/L (ref 39–117)
BUN: 17 mg/dL (ref 6–23)
CO2: 28 mEq/L (ref 19–32)
Calcium: 10.1 mg/dL (ref 8.4–10.5)
Chloride: 102 mEq/L (ref 96–112)
Creatinine, Ser: 1.68 mg/dL — ABNORMAL HIGH (ref 0.40–1.50)
GFR: 36.65 mL/min — ABNORMAL LOW (ref >60.00)
Glucose, Bld: 95 mg/dL (ref 70–99)
Potassium: 4.1 mEq/L (ref 3.5–5.1)
Sodium: 139 mEq/L (ref 135–145)
Total Bilirubin: 2.8 mg/dL — ABNORMAL HIGH (ref 0.2–1.2)
Total Protein: 7.5 g/dL (ref 6.0–8.3)

## 2021-10-14 LAB — LDL CHOLESTEROL, DIRECT: Direct LDL: 102 mg/dL

## 2021-10-14 LAB — PSA: PSA: 0 ng/mL — ABNORMAL LOW (ref 0.10–4.00)

## 2021-10-14 LAB — URINE CULTURE
MICRO NUMBER:: 13214174
Result:: NO GROWTH
SPECIMEN QUALITY:: ADEQUATE

## 2021-10-14 LAB — LIPID PANEL
Cholesterol: 162 mg/dL (ref 0–200)
HDL: 29.8 mg/dL — ABNORMAL LOW (ref >39.00)
NonHDL: 132.41
Total CHOL/HDL Ratio: 5
Triglycerides: 292 mg/dL — ABNORMAL HIGH (ref 0.0–149.0)
VLDL: 58.4 mg/dL — ABNORMAL HIGH (ref 0.0–40.0)

## 2021-10-14 LAB — TSH: TSH: 5.04 u[IU]/mL (ref 0.35–5.50)

## 2021-10-14 LAB — HEMOGLOBIN A1C: Hgb A1c MFr Bld: 5.9 % (ref 4.6–6.5)

## 2021-10-15 ENCOUNTER — Encounter: Payer: Self-pay | Admitting: Family Medicine

## 2021-10-25 ENCOUNTER — Encounter: Payer: Self-pay | Admitting: Family Medicine

## 2021-10-25 DIAGNOSIS — D7589 Other specified diseases of blood and blood-forming organs: Secondary | ICD-10-CM

## 2021-10-29 ENCOUNTER — Ambulatory Visit (HOSPITAL_COMMUNITY)
Admission: RE | Admit: 2021-10-29 | Payer: Medicare Other | Source: Ambulatory Visit | Attending: Family Medicine | Admitting: Family Medicine

## 2021-11-06 ENCOUNTER — Other Ambulatory Visit (INDEPENDENT_AMBULATORY_CARE_PROVIDER_SITE_OTHER): Payer: Medicare Other

## 2021-11-06 ENCOUNTER — Encounter (HOSPITAL_COMMUNITY): Payer: Self-pay

## 2021-11-06 DIAGNOSIS — D7589 Other specified diseases of blood and blood-forming organs: Secondary | ICD-10-CM

## 2021-11-07 LAB — B12 AND FOLATE PANEL
Folate: 16.6 ng/mL (ref >5.9)
Vitamin B-12: >1504 pg/mL — ABNORMAL HIGH (ref 211–911)

## 2021-11-09 ENCOUNTER — Encounter: Payer: Self-pay | Admitting: Family Medicine

## 2022-01-07 ENCOUNTER — Telehealth: Payer: Self-pay | Admitting: Family Medicine

## 2022-01-07 NOTE — Telephone Encounter (Signed)
Left message for patient to call back and schedule Medicare Annual Wellness Visit (AWV).   Please offer to do virtually or by telephone.  Left office number and my jabber [phone].  Last AWV:09/18/2019  Please schedule at anytime with Nurse Health Advisor.

## 2022-01-12 ENCOUNTER — Ambulatory Visit (INDEPENDENT_AMBULATORY_CARE_PROVIDER_SITE_OTHER): Payer: Medicare Other

## 2022-01-12 ENCOUNTER — Encounter: Payer: Self-pay | Admitting: Family Medicine

## 2022-01-12 ENCOUNTER — Telehealth: Payer: Self-pay

## 2022-01-12 DIAGNOSIS — Z0000 Encounter for general adult medical examination without abnormal findings: Secondary | ICD-10-CM

## 2022-01-12 NOTE — Telephone Encounter (Signed)
Pt would like to let you know that he is experiencing an increase of vertigo would like to know if need a referral for neurology, if so he would like to go somewhere in [facility] area.

## 2022-01-12 NOTE — Patient Instructions (Signed)
[name] , Thank you for taking time to come for your Medicare Wellness Visit. I appreciate your ongoing commitment to your health goals. Please review the following plan we discussed and let me know if I can assist you in the future.   Screening recommendations/referrals: Colonoscopy: no longer needed Recommended yearly ophthalmology/optometry visit for glaucoma screening and checkup Recommended yearly dental visit for hygiene and checkup  Vaccinations: Influenza vaccine: declined Pneumococcal vaccine: up to date Tdap vaccine: declined Shingles vaccine: declined   Covid-19: declined  Advanced directives: yes, not on file  Conditions/risks identified: see problem list   Next appointment: Follow up in one year for your annual wellness visit.   Preventive Care 13 Years and Older, Male Preventive care refers to lifestyle choices and visits with your health care provider that can promote health and wellness. What does preventive care include? A yearly physical exam. This is also called an annual well check. Dental exams once or twice a year. Routine eye exams. Ask your health care provider how often you should have your eyes checked. Personal lifestyle choices, including: Daily care of your teeth and gums. Regular physical activity. Eating a healthy diet. Avoiding tobacco and drug use. Limiting alcohol use. Practicing safe sex. Taking low doses of aspirin every day. Taking vitamin and mineral supplements as recommended by your health care provider. What happens during an annual well check? The services and screenings done by your health care provider during your annual well check will depend on your age, overall health, lifestyle risk factors, and family history of disease. Counseling  Your health care provider may ask you questions about your: Alcohol use. Tobacco use. Drug use. Emotional well-being. Home and relationship well-being. Sexual activity. Eating  habits. History of falls. Memory and ability to understand (cognition). Work and work Statistician. Screening  You may have the following tests or measurements: Height, weight, and BMI. Blood pressure. Lipid and cholesterol levels. These may be checked every 5 years, or more frequently if you are over 71 years old. Skin check. Lung cancer screening. You may have this screening every year starting at age 24 if you have a 30-pack-year history of smoking and currently smoke or have quit within the past 15 years. Fecal occult blood test (FOBT) of the stool. You may have this test every year starting at age 36. Flexible sigmoidoscopy or colonoscopy. You may have a sigmoidoscopy every 5 years or a colonoscopy every 10 years starting at age 91. Prostate cancer screening. Recommendations will vary depending on your family history and other risks. Hepatitis C blood test. Hepatitis B blood test. Sexually transmitted disease (STD) testing. Diabetes screening. This is done by checking your blood sugar (glucose) after you have not eaten for a while (fasting). You may have this done every 1-3 years. Abdominal aortic aneurysm (AAA) screening. You may need this if you are a current or former smoker. Osteoporosis. You may be screened starting at age 37 if you are at high risk. Talk with your health care provider about your test results, treatment options, and if necessary, the need for more tests. Vaccines  Your health care provider may recommend certain vaccines, such as: Influenza vaccine. This is recommended every year. Tetanus, diphtheria, and acellular pertussis (Tdap, Td) vaccine. You may need a Td booster every 10 years. Zoster vaccine. You may need this after age 34. Pneumococcal 13-valent conjugate (PCV13) vaccine. One dose is recommended after age 51. Pneumococcal polysaccharide (PPSV23) vaccine. One dose is recommended after age 54. Talk to  your health care provider about which screenings and  vaccines you need and how often you need them. This information is not intended to replace advice given to you by your health care provider. Make sure you discuss any questions you have with your health care provider. Document Released: 07/26/2015 Document Revised: 03/18/2016 Document Reviewed: 04/30/2015 Elsevier Interactive Patient Education  2017 [place] Prevention in the Home Falls can cause injuries. They can happen to people of all ages. There are many things you can do to make your home safe and to help prevent falls. What can I do on the outside of my home? Regularly fix the edges of walkways and driveways and fix any cracks. Remove anything that might make you trip as you walk through a door, such as a raised step or threshold. Trim any bushes or trees on the path to your home. Use bright outdoor lighting. Clear any walking paths of anything that might make someone trip, such as rocks or tools. Regularly check to see if handrails are loose or broken. Make sure that both sides of any steps have handrails. Any raised decks and porches should have guardrails on the edges. Have any leaves, snow, or ice cleared regularly. Use sand or salt on walking paths during winter. Clean up any spills in your garage right away. This includes oil or grease spills. What can I do in the bathroom? Use night lights. Install grab bars by the toilet and in the tub and shower. Do not use towel bars as grab bars. Use non-skid mats or decals in the tub or shower. If you need to sit down in the shower, use a plastic, non-slip stool. Keep the floor dry. Clean up any water that spills on the floor as soon as it happens. Remove soap buildup in the tub or shower regularly. Attach bath mats securely with double-sided non-slip rug tape. Do not have throw rugs and other things on the floor that can make you trip. What can I do in the bedroom? Use night lights. Make sure that you have a light by your  bed that is easy to reach. Do not use any sheets or blankets that are too big for your bed. They should not hang down onto the floor. Have a firm chair that has side arms. You can use this for support while you get dressed. Do not have throw rugs and other things on the floor that can make you trip. What can I do in the kitchen? Clean up any spills right away. Avoid walking on wet floors. Keep items that you use a lot in easy-to-reach places. If you need to reach something above you, use a strong step stool that has a grab bar. Keep electrical cords out of the way. Do not use floor polish or wax that makes floors slippery. If you must use wax, use non-skid floor wax. Do not have throw rugs and other things on the floor that can make you trip. What can I do with my stairs? Do not leave any items on the stairs. Make sure that there are handrails on both sides of the stairs and use them. Fix handrails that are broken or loose. Make sure that handrails are as long as the stairways. Check any carpeting to make sure that it is firmly attached to the stairs. Fix any carpet that is loose or worn. Avoid having throw rugs at the top or bottom of the stairs. If you do have throw rugs, attach  them to the floor with carpet tape. Make sure that you have a light switch at the top of the stairs and the bottom of the stairs. If you do not have them, ask someone to add them for you. What else can I do to help prevent falls? Wear shoes that: Do not have high heels. Have rubber bottoms. Are comfortable and fit you well. Are closed at the toe. Do not wear sandals. If you use a stepladder: Make sure that it is fully opened. Do not climb a closed stepladder. Make sure that both sides of the stepladder are locked into place. Ask someone to hold it for you, if possible. Clearly mark and make sure that you can see: Any grab bars or handrails. First and last steps. Where the edge of each step is. Use tools that  help you move around (mobility aids) if they are needed. These include: Canes. Walkers. Scooters. Crutches. Turn on the lights when you go into a dark area. Replace any light bulbs as soon as they burn out. Set up your furniture so you have a clear path. Avoid moving your furniture around. If any of your floors are uneven, fix them. If there are any pets around you, be aware of where they are. Review your medicines with your doctor. Some medicines can make you feel dizzy. This can increase your chance of falling. Ask your doctor what other things that you can do to help prevent falls. This information is not intended to replace advice given to you by your health care provider. Make sure you discuss any questions you have with your health care provider. Document Released: 04/25/2009 Document Revised: 12/05/2015 Document Reviewed: 08/03/2014 Elsevier Interactive Patient Education  2017 [employer].

## 2022-01-12 NOTE — Progress Notes (Signed)
Subjective:   [name] is a 88 y.o. male who presents for Medicare Annual/Subsequent preventive examination.  I connected with  [name] on 01/12/22 by a audio enabled telemedicine application and verified that I am speaking with the correct person using two identifiers.  Patient Location: Home  Provider Location: Office/Clinic  I discussed the limitations of evaluation and management by telemedicine. The patient expressed understanding and agreed to proceed.   Review of Systems     Cardiac Risk Factors include: advanced age (>42mn, >>53women);obesity (BMI >30kg/m2);diabetes mellitus;hypertension;dyslipidemia;male gender     Objective:    There were no vitals filed for this visit. There is no height or weight on file to calculate BMI.     01/12/2022   11:09 AM 11/10/2020   12:31 PM 09/08/2020    6:32 PM 07/25/2020    4:01 PM 09/18/2019   10:12 AM 02/23/2019   11:38 AM 10/28/2017   12:51 PM  Advanced Directives  Does Patient Have a Medical Advance Directive? Yes Yes No No Yes Yes Yes  Type of AParamedicof A[place] BaseOut of facility DNR (pink MOST or yellow form);Living will [place] will   [place]  [place]  Does patient want to make changes to medical advance directive?     No - Patient declined    Copy of [place] Chart? No - copy requested No - copy requested   No - copy requested  No - copy requested  Would patient like information on creating a medical advance directive? No - Patient declined  No - Patient declined        Current Medications (verified) Outpatient Encounter Medications as of 01/12/2022  Medication Sig   blood glucose meter kit and supplies KIT Dispense based on patient and insurance preference. Use up to four times daily as directed.   clobetasol (TEMOVATE) 0.05 % external solution Apply 1 application topically daily  as needed (itching on scalp).   dapagliflozin propanediol (FARXIGA) 5 MG TABS tablet TAKE 1 TABLET BY MOUTH DAILY BEFORE BREAKFAST.   ezetimibe (ZETIA) 10 MG tablet TAKE 1 TABLET BY MOUTH EVERY DAY   hydrocortisone 2.5 % cream Apply 1 application topically 2 (two) times daily as needed (itching).   ipratropium (ATROVENT) 0.03 % nasal spray PLACE 2 SPRAYS INTO THE NOSE 2 (TWO) TIMES DAILY.   Naphazoline-Pheniramine (OPCON-A) 0.027-0.315 % SOLN Place 1-2 drops into both eyes 3 (three) times daily as needed (for itching, redness, and/or irritation).   nitroGLYCERIN (NITROLINGUAL) 0.4 MG/SPRAY spray Place 1 spray under the tongue every 5 (five) minutes x 3 doses as needed for chest pain.   tamsulosin (FLOMAX) 0.4 MG CAPS capsule Take 0.4 mg by mouth daily.   thyroid (ARMOUR THYROID) 120 MG tablet Take 1 tablet (120 mg total) by mouth daily.   triamcinolone cream (KENALOG) 0.1 % Apply 1 application topically daily as needed (for rashes).    XARELTO 15 MG TABS tablet TAKE 1 TABLET (15 MG TOTAL) BY MOUTH DAILY WITH SUPPER.   No facility-administered encounter medications on file as of 01/12/2022.    Allergies (verified) Ciprofloxacin, Codeine, Flexeril [cyclobenzaprine], Imdur [isosorbide nitrate], Methocarbamol, Other, Strawberry extract, Sulfa antibiotics, Sulfamethoxazole, Sulfonamide derivatives, Tomato, and Flagyl [metronidazole]   History: Past Medical History:  Diagnosis Date   Anginal pain ([place]    Anxiety    " OCCASIONAL"   Arthritis    Asthma due to seasonal  allergies    uses inhalers prn   Carotid artery occlusion    Left   Complication of anesthesia    " had tremors after prostate surgery   Coronary artery disease    Diverticulitis    recurrent   Family history of anesthesia complication    MOTHER    GERD (gastroesophageal reflux disease)    H/O hiatal hernia    Hearing aid worn    Hyperlipemia    Hypertension    Kidney stone    lithotripsy 1700 w complications, req  stents, Dr Rosana Hoes   Prostate CA [facility])    Shortness of breath    Thyroid disease    TIA (transient ischemic attack)    Tinnitus    Past Surgical History:  Procedure Laterality Date   CARDIAC CATHETERIZATION  2008   minimal dz, Dr Cathie Olden   CARDIAC CATHETERIZATION N/A 10/09/2015   Procedure: Left Heart Cath and Coronary Angiography;  Surgeon: Peter M Martinique, MD;  Location: [place] [facility] LAB;  Service: Cardiovascular;  Laterality: N/A;   CARDIAC CATHETERIZATION N/A 10/09/2015   Procedure: Intravascular Pressure Wire/FFR Study;  Surgeon: Peter M Martinique, MD;  Location: [place] [facility] LAB;  Service: Cardiovascular;  Laterality: N/A;   CARDIAC CATHETERIZATION N/A 10/09/2015   Procedure: Coronary Stent Intervention;  Surgeon: Peter M Martinique, MD;  Location: [place] [facility] LAB;  Service: Cardiovascular;  Laterality: N/A;   CAROTID ENDARTERECTOMY  Left   1998   CHOLECYSTECTOMY     CORONARY STENT PLACEMENT  10/09/2015   DES to RCA   ESOPHAGOGASTRODUODENOSCOPY (EGD) WITH PROPOFOL Left 03/26/2017   Procedure: ESOPHAGOGASTRODUODENOSCOPY (EGD) WITH PROPOFOL;  Surgeon: Arta Silence, MD;  Location: [facility];  Service: Endoscopy;  Laterality: Left;   LEFT HEART CATHETERIZATION WITH CORONARY ANGIOGRAM N/A 09/15/2013   Procedure: LEFT HEART CATHETERIZATION WITH CORONARY ANGIOGRAM;  Surgeon: Peter M Martinique, MD;  Location: [facility] CATH LAB;  Service: Cardiovascular;  Laterality: N/A;   PROSTATECTOMY  1998   radical for prostate cancer   Family History  Problem Relation Age of Onset   Heart disease Mother    Heart disease Father    Kidney failure Father    Heart disease Sister    Heart attack Sister    Dementia Sister    Sjogren's syndrome Child    Hashimoto's thyroiditis Child    CAD Child    Social History   Socioeconomic History   Marital status: Married    Spouse name: Not on file   Number of children: Not on file   Years of education: Not on file   Highest education level: Not on file   Occupational History   Occupation: retired    Comment: Actor [employer]  Tobacco Use   Smoking status: Former    Packs/day: 2.50    Years: 30.00    Total pack years: 75.00    Types: Cigarettes    Quit date: 08/28/1985    Years since quitting: 36.4   Smokeless tobacco: Never  Substance and Sexual Activity   Alcohol use: Yes    Alcohol/week: 2.0 standard drinks of alcohol    Types: 2 Standard drinks or equivalent per week    Comment: 1 glass wine/week (prior 1 glass martini with dinner)   Drug use: No   Sexual activity: Yes    Birth control/protection: None  Other Topics Concern   Not on file  Social History Narrative   Lives with wife--recently diagnosed with breast ca; No smoking; Occassional wine;  Retired; 2 daughters live in [place]--5 grandchildren(boys). On weight watchers. Lives in [place] with wife. Retired Solicitor. Tobacco history 2 ppd x 32 years. No smoking x 25 years. No drugs.   Social Determinants of Health   Financial Resource Strain: Low Risk  (09/18/2019)   Overall Financial Resource Strain (CARDIA)    Difficulty of Paying Living Expenses: Not hard at all  Food Insecurity: No Food Insecurity (09/18/2019)   Hunger Vital Sign    Worried About Running Out of Food in the Last Year: Never true    Ran Out of Food in the Last Year: Never true  Transportation Needs: No Transportation Needs (09/18/2019)   PRAPARE - Hydrologist (Medical): No    Lack of Transportation (Non-Medical): No  Physical Activity: Not on file  Stress: Not on file  Social Connections: Not on file    Tobacco Counseling Counseling given: Not Answered   Clinical Intake:  Pre-visit preparation completed: Yes  Pain : No/denies pain     BMI - recorded: 33.35 Nutritional Status: BMI > 30  Obese Nutritional Risks: Nausea/ vomitting/ diarrhea Diabetes: Yes CBG done?: No Did pt. bring in CBG monitor from home?: No  How often do you  need to have someone help you when you read instructions, pamphlets, or other written materials from your doctor or pharmacy?: 1 - Never  Diabetic?yes Nutrition Risk Assessment:  Has the patient had any N/V/D within the last 2 months?  Yes  Does the patient have any non-healing wounds?  No  Has the patient had any unintentional weight loss or weight gain?  No   Diabetes:  Is the patient diabetic?  Yes  If diabetic, was a CBG obtained today?  No  Did the patient bring in their glucometer from home?  No  How often do you monitor your CBG's? N/A.   Financial Strains and Diabetes Management:  Are you having any financial strains with the device, your supplies or your medication? No .  Does the patient want to be seen by Chronic Care Management for management of their diabetes?  No  Would the patient like to be referred to a Nutritionist or for Diabetic Management?  No   Diabetic Exams:  Diabetic Eye Exam: Overdue for diabetic eye exam. Pt has been advised about the importance in completing this exam. Patient advised to call and schedule an eye exam. Diabetic Foot Exam: Overdue, Pt has been advised about the importance in completing this exam. Pt is scheduled for diabetic foot exam on n/a.   Interpreter Needed?: No  Information entered by :: [facility] of Daily Living    01/12/2022   11:23 AM  In your present state of health, do you have any difficulty performing the following activities:  Hearing? 1  Vision? 1  Difficulty concentrating or making decisions? 0  Walking or climbing stairs? 0  Dressing or bathing? 0  Doing errands, shopping? 1  Preparing Food and eating ? N  Using the Toilet? N  In the past six months, have you accidently leaked urine? N  Do you have problems with loss of bowel control? N  Managing your Medications? N  Managing your Finances? N  Housekeeping or managing your Housekeeping? N    Patient Care Team: Copland, Gay Filler, MD as  PCP - General (Family Medicine) Martinique, Peter M, MD as PCP - Cardiology (Cardiology) Myrlene Broker, MD as Attending Physician (Urology) Zachary Lobo, MD  as Consulting Physician (Gastroenterology)  Indicate any recent Medical Services you may have received from other than [facility] providers in the past year (date may be approximate).     Assessment:   This is a routine wellness examination for [place].  Hearing/Vision screen No results found.  Dietary issues and exercise activities discussed: Current Exercise Habits: The patient does not participate in regular exercise at present, Exercise limited by: orthopedic condition(s)   Goals Addressed             This Visit's Progress    Maintain health   On track      Depression Screen    01/12/2022   11:11 AM 10/13/2021    1:38 PM 09/18/2019   10:26 AM 06/28/2017    8:34 AM 08/09/2015    3:22 PM 05/30/2015    3:33 PM 04/03/2015   10:07 AM  PHQ 2/9 Scores  PHQ - 2 Score 0 0 0 0 0 0 0  Exception Documentation     Patient refusal      Fall Risk    01/12/2022   11:10 AM 10/13/2021    1:38 PM 09/18/2019   10:25 AM 06/28/2017    8:34 AM 08/09/2015    3:22 PM  [place] in the past year? 0 0 0 No No  Number falls in past yr: 0 0 0    Injury with Fall? 0 0 0    Risk for fall due to : No Fall Risks      Follow up Falls evaluation completed  Education provided;Falls prevention discussed      FALL RISK PREVENTION PERTAINING TO THE HOME:  Any stairs in or around the home? Yes  If so, are there any without handrails? No  Home free of loose throw rugs in walkways, pet beds, electrical cords, etc? Yes  Adequate lighting in your home to reduce risk of falls? Yes   ASSISTIVE DEVICES UTILIZED TO PREVENT FALLS:  Life alert? No  Use of a cane, walker or w/c? Yes  Grab bars in the bathroom? No  Shower chair or bench in shower? No  Elevated toilet seat or a handicapped toilet? Yes   TIMED UP AND GO:  Was the test performed? No  .    Cognitive Function:      06/17/2015    8:42 AM  Montreal Cognitive Assessment   Visuospatial/ Executive (0/5) 5  Naming (0/3) 3  Attention: Read list of digits (0/2) 2  Attention: Read list of letters (0/1) 1  Attention: Serial 7 subtraction starting at 100 (0/3) 3  Language: Repeat phrase (0/2) 2  Language : Fluency (0/1) 1  Abstraction (0/2) 2  Delayed Recall (0/5) 4  Orientation (0/6) 6  Total 29  Adjusted Score (based on education) 29      01/12/2022   11:29 AM 09/18/2019   10:19 AM  6CIT Screen  What Year? 0 points 0 points  What month? 0 points 0 points  What time? 0 points 0 points  Count back from 20 0 points 0 points  Months in reverse 0 points 0 points  Repeat phrase 0 points 0 points  Total Score 0 points 0 points    Immunizations Immunization History  Administered Date(s) Administered   Influenza Split 04/29/2011   Influenza Whole 05/18/2007, 03/13/2008, 04/01/2010   Influenza, High Dose Seasonal PF 03/23/2015, 05/12/2018   Influenza,inj,Quad PF,6+ Mos 04/22/2013, 03/21/2014   Influenza-Unspecified 04/17/2013, 03/29/2017   Pneumococcal Conjugate-13 09/25/2015  Pneumococcal Polysaccharide-23 03/13/2005, 07/18/2014   Td 09/25/2008   Zoster, Live 02/07/2010    TDAP status: Due, Education has been provided regarding the importance of this vaccine. Advised may receive this vaccine at local pharmacy or Health Dept. Aware to provide a copy of the vaccination record if obtained from local pharmacy or Health Dept. Verbalized acceptance and understanding.  Flu Vaccine status: Declined, Education has been provided regarding the importance of this vaccine but patient still declined. Advised may receive this vaccine at local pharmacy or Health Dept. Aware to provide a copy of the vaccination record if obtained from local pharmacy or Health Dept. Verbalized acceptance and understanding.  Pneumococcal vaccine status: Up to date  Covid-19 vaccine status: Declined,  Education has been provided regarding the importance of this vaccine but patient still declined. Advised may receive this vaccine at local pharmacy or Health Dept.or vaccine clinic. Aware to provide a copy of the vaccination record if obtained from local pharmacy or Health Dept. Verbalized acceptance and understanding.  Qualifies for Shingles Vaccine? Yes   Zostavax completed No   Shingrix Completed?: No.    Education has been provided regarding the importance of this vaccine. Patient has been advised to call insurance company to determine out of pocket expense if they have not yet received this vaccine. Advised may also receive vaccine at local pharmacy or Health Dept. Verbalized acceptance and understanding.  Screening Tests Health Maintenance  Topic Date Due   COVID-19 Vaccine (1) Never done   FOOT EXAM  Never done   OPHTHALMOLOGY EXAM  Never done   Zoster Vaccines- Shingrix (1 of 2) Never done   URINE MICROALBUMIN  06/19/2021   TETANUS/TDAP  10/14/2022 (Originally 09/26/2018)   INFLUENZA VACCINE  02/10/2022   HEMOGLOBIN A1C  04/14/2022   Pneumonia Vaccine 85+ Years old  Completed   HPV VACCINES  Aged Out    Health Maintenance  Health Maintenance Due  Topic Date Due   COVID-19 Vaccine (1) Never done   FOOT EXAM  Never done   OPHTHALMOLOGY EXAM  Never done   Zoster Vaccines- Shingrix (1 of 2) Never done   URINE MICROALBUMIN  06/19/2021    Colorectal cancer screening: No longer required.   Lung Cancer Screening: (Low Dose CT Chest recommended if Age 24-80 years, 30 pack-year currently smoking OR have quit w/in 15years.) does not qualify.   Lung Cancer Screening Referral: n/a  Additional Screening:  Hepatitis C Screening: does not qualify; Completed aged out  Vision Screening: Recommended annual ophthalmology exams for early detection of glaucoma and other disorders of the eye. Is the patient up to date with their annual eye exam?  Yes  Who is the provider or what is the  name of the office in which the patient attends annual eye exams? Dr. Sherlon Handing If pt is not established with a provider, would they like to be referred to a provider to establish care? No .   Dental Screening: Recommended annual dental exams for proper oral hygiene  Community Resource Referral / Chronic Care Management: CRR required this visit?  No   CCM required this visit?  No      Plan:     I have personally reviewed and noted the following in the patient's chart:   Medical and social history Use of alcohol, tobacco or illicit drugs  Current medications and supplements including opioid prescriptions. Patient is not currently taking opioid prescriptions. Functional ability and status Nutritional status Physical activity Advanced directives List of other physicians  Hospitalizations, surgeries, and ER visits in previous 12 months Vitals Screenings to include cognitive, depression, and falls Referrals and appointments  In addition, I have reviewed and discussed with patient certain preventive protocols, quality metrics, and best practice recommendations. A written personalized care plan for preventive services as well as general preventive health recommendations were provided to patient.   Due to this being a telephonic visit, the after visit summary with patients personalized plan was offered to patient via mail or my-chart.  Per request, patient was mailed a copy of AVS.  Duard Brady Icis [place], [place]   01/12/2022   Nurse Notes: none

## 2022-01-16 NOTE — Progress Notes (Addendum)
[name] at [facility] [address] [place], [place] [address] [phone]  Date:  01/22/2022   Name:  [name]   DOB:  14-Feb-1934   MRN:  [id number]  PCP:  Zachary Mclean, MD    Chief Complaint: Dizziness (X 1 year come and go. Worsening. [name] says he is due for Physical as well. /Right flank pain-better. Sallee Provencal due/Foot exam due. [name] exam: cataract surgery 3 days)   History of Present Illness:  [name] is a 88 y.o. very pleasant male patient who presents with the following:  Patient seen today for back pain and vertigo Most recent visit with myself was in April  History of hypertension, TIA, CAD, paroxysmal atrial fib, carotid artery occlusion status post endarterectomy, sleep apnea, chronic renal disease, hyperlipidemia, memory loss, hard of hearing, prostate cancer status post radical prostatectomy in 1998, hypothyroidism-also chronic stable angina  At his last appointment patient and his wife noted he was sleeping quite a bit.  He also noted dizziness, which she described as a feeling of being off balance  He contacted me via MyChart about a week ago:  I just woke up from a 3 hr nap.. My "Vertigo" is still with me! I'm sitting on the edge of my bed trying not to move. The room is slowly moving around me giving the feeling I'm moving, even tho I'm trying to sit still. My head feels empty, but with a slight headache. When I open my eyes, everything starts to move slowly around me. My head is light feeling, somehow disconnected from my body.  Soooo dizzy!! Seleta Rhymes! I don't know any other way to describe it. This has been going on for several months now, but up until today, always went away a minute or so after I woke up.  Now it appears lasting!   He is interested in seeing neurology which we can certainly arrange, however as a neurology appointment may take some time we decided to have him see me first to further discuss  his symptoms  At last visit I ordered a carotid artery ultrasound, patient has history of previous endarterectomy I also ordered a CT abdomen/pelvis for concern of prolonged flank pain However, neither of these studies have been completed as of yet Pt notes the flank pain is coming and going -this is not his main concern right now  He has noted vertigo for a year, but getting worse/ more persistent  He will notice the "room flipping upside down" when he first sits up in the am He also notes that he is feeling lightheaded a lot- this is really his main issue right now Moving his head quickly will trigger some vertigo He does get lightheaded upon standing up-sometimes has to sit right back down again.  He feels like he is getting weaker and more easily fatigued No headache  Pt does have known history of aortic stenosis-  Echo done 2/22 1. Left ventricular ejection fraction, by estimation, is 70 to 75%. The  left ventricle has hyperdynamic function. The left ventricle has no  regional wall motion abnormalities. There is moderate concentric left  ventricular hypertrophy. Left ventricular  diastolic parameters are consistent with Grade I diastolic dysfunction  (impaired relaxation). Elevated left atrial pressure.   2. Right ventricular systolic function is normal. The right ventricular  size is normal. There is normal pulmonary artery systolic pressure.   3. Left atrial size was mild to  moderately dilated.   4. The mitral valve is normal in structure. No evidence of mitral valve  regurgitation. No evidence of mitral stenosis. Moderate mitral annular  calcification.   5. The aortic valve is calcified. There is severe calcifcation of the  aortic valve. There is severe thickening of the aortic valve. Aortic valve  regurgitation is not visualized. Mild aortic valve stenosis. Aortic valve  mean gradient measures 12.0 mmHg.   6. The inferior vena cava is normal in size with greater than 50%   respiratory variability, suggesting right atrial pressure of 3 mmHg.   Lab Results  Component Value Date   HGBA1C 5.9 10/13/2021    Patient Active Problem List   Diagnosis Date Noted   Uncontrolled type 2 diabetes mellitus with hyperglycemia, without long-term current use of insulin ([place]) 09/09/2020   Chest pain 09/08/2020   Acute diverticulitis 02/07/2019   Angina pectoris, variant ([place]) 05/19/2018   Poor compliance with CPAP treatment 04/20/2018   Excessive daytime sleepiness 03/22/2018   Poor sleep hygiene 03/22/2018   Ventral hernia without obstruction or gangrene 10/09/2017   TIA (transient ischemic attack)    Kidney stone    Hypertension    Hearing aid worn    Coronary artery disease involving native coronary artery of native heart    Asthma due to seasonal allergies    Arthritis    Erosive gastropathy 03/26/2017   Acute blood loss anemia 03/25/2017   Melena 03/24/2017   Chronic kidney disease, stage 3b ([place]) 03/24/2017   AF (paroxysmal atrial fibrillation) ([place]) 03/24/2017   Angina pectoris ([place]) 10/09/2015   Essential hypertension    Hyperlipemia    Carotid artery occlusion    Dizziness 08/09/2015   Gait instability 05/30/2015   Hypothyroidism, acquired 10/26/2013   OSA (obstructive sleep apnea) 10/18/2013   Benign localized hyperplasia of prostate with urinary obstruction and other lower urinary tract symptoms (LUTS)(600.21) 12/21/2012   Carcinoma in situ of prostate 12/21/2012   Left shoulder pain 08/08/2012   Class 1 obesity due to excess calories with serious comorbidity and body mass index (BMI) of 34.0 to 34.9 in adult 07/24/2011   Generalized anxiety disorder 07/24/2011   DIVERTICULOSIS OF COLON 12/25/2009   DERMATITIS, SEBORRHEIC 12/25/2009   Noise-induced hearing loss 12/18/2009   Memory loss 08/14/2009   VENOUS INSUFFICIENCY, CHRONIC 09/09/2006   GERD without esophagitis 09/09/2006   HERNIA, HIATAL, NONCONGENITAL 09/09/2006   INSOMNIA NOS  09/09/2006    Past Medical History:  Diagnosis Date   Anginal pain ([place])    Anxiety    " OCCASIONAL"   Arthritis    Asthma due to seasonal allergies    uses inhalers prn   Carotid artery occlusion    Left   Complication of anesthesia    " had tremors after prostate surgery   Coronary artery disease    Diverticulitis    recurrent   Family history of anesthesia complication    MOTHER    GERD (gastroesophageal reflux disease)    H/O hiatal hernia    Hearing aid worn    Hyperlipemia    Hypertension    Kidney stone    lithotripsy 1308 w complications, req stents, Dr Rosana Hoes   Prostate CA [facility])    Shortness of breath    Thyroid disease    TIA (transient ischemic attack)    Tinnitus     Past Surgical History:  Procedure Laterality Date   CARDIAC CATHETERIZATION  2008   minimal dz, Dr Cathie Olden   CARDIAC  CATHETERIZATION N/A 10/09/2015   Procedure: Left Heart Cath and Coronary Angiography;  Surgeon: Peter M Martinique, MD;  Location: [facility];  Service: Cardiovascular;  Laterality: N/A;   CARDIAC CATHETERIZATION N/A 10/09/2015   Procedure: Intravascular Pressure Wire/FFR Study;  Surgeon: Peter M Martinique, MD;  Location: [facility];  Service: Cardiovascular;  Laterality: N/A;   CARDIAC CATHETERIZATION N/A 10/09/2015   Procedure: Coronary Stent Intervention;  Surgeon: Peter M Martinique, MD;  Location: [facility];  Service: Cardiovascular;  Laterality: N/A;   CAROTID ENDARTERECTOMY  Left   1998   CHOLECYSTECTOMY     CORONARY STENT PLACEMENT  10/09/2015   DES to RCA   ESOPHAGOGASTRODUODENOSCOPY (EGD) WITH PROPOFOL Left 03/26/2017   Procedure: ESOPHAGOGASTRODUODENOSCOPY (EGD) WITH PROPOFOL;  Surgeon: Arta Silence, MD;  Location: [facility];  Service: Endoscopy;  Laterality: Left;   LEFT HEART CATHETERIZATION WITH CORONARY ANGIOGRAM N/A 09/15/2013   Procedure: LEFT HEART CATHETERIZATION WITH CORONARY ANGIOGRAM;  Surgeon: Peter M Martinique, MD;  Location: [facility] CATH LAB;   Service: Cardiovascular;  Laterality: N/A;   PROSTATECTOMY  1998   radical for prostate cancer    Social History   Tobacco Use   Smoking status: Former    Packs/day: 2.50    Years: 30.00    Total pack years: 75.00    Types: Cigarettes    Quit date: 08/28/1985    Years since quitting: 36.4   Smokeless tobacco: Never  Substance Use Topics   Alcohol use: Yes    Alcohol/week: 2.0 standard drinks of alcohol    Types: 2 Standard drinks or equivalent per week    Comment: 1 glass wine/week (prior 1 glass martini with dinner)   Drug use: No    Family History  Problem Relation Age of Onset   Heart disease Mother    Heart disease Father    Kidney failure Father    Heart disease Sister    Heart attack Sister    Dementia Sister    Sjogren's syndrome Child    Hashimoto's thyroiditis Child    CAD Child     Allergies  Allergen Reactions   Ciprofloxacin Other (See Comments)    Hallucinations or jitteriness   Codeine Other (See Comments)    Hallucinations    Flexeril [Cyclobenzaprine] Other (See Comments)    "Made me feel goofy"   Imdur [Isosorbide Nitrate] Other (See Comments)    Caused headaches   Methocarbamol Other (See Comments)    "Made me feel goofy"   Other Other (See Comments)    CANNOT HAVE ANY FOODS WITH SEEDS    Strawberry Extract Other (See Comments)    CANNOT HAVE ANY FOODS WITH SEEDS   Sulfa Antibiotics Other (See Comments)    Chills and shaking- "serum sickness"    Sulfamethoxazole Other (See Comments)    Chills and shaking- "serum sickness"   Sulfonamide Derivatives Other (See Comments)    Chills and shaking "serum sickness"   Tomato Other (See Comments)    CANNOT HAVE ANY FOODS WITH SEEDS   Flagyl [Metronidazole] Rash    Medication list has been reviewed and updated.  Current Outpatient Medications on File Prior to Visit  Medication Sig Dispense Refill   blood glucose meter kit and supplies KIT Dispense based on patient and insurance preference.  Use up to four times daily as directed. 1 each 0   clobetasol (TEMOVATE) 0.05 % external solution Apply 1 application topically daily as needed (itching on scalp).  3  dapagliflozin propanediol (FARXIGA) 5 MG TABS tablet TAKE 1 TABLET BY MOUTH DAILY BEFORE BREAKFAST. 30 tablet 12   ezetimibe (ZETIA) 10 MG tablet TAKE 1 TABLET BY MOUTH EVERY DAY 90 tablet 1   hydrocortisone 2.5 % cream Apply 1 application topically 2 (two) times daily as needed (itching).     ipratropium (ATROVENT) 0.03 % nasal spray PLACE 2 SPRAYS INTO THE NOSE 2 (TWO) TIMES DAILY. 30 mL 6   Naphazoline-Pheniramine (OPCON-A) 0.027-0.315 % SOLN Place 1-2 drops into both eyes 3 (three) times daily as needed (for itching, redness, and/or irritation).     nitroGLYCERIN (NITROLINGUAL) 0.4 MG/SPRAY spray Place 1 spray under the tongue every 5 (five) minutes x 3 doses as needed for chest pain. 12 g 5   tamsulosin (FLOMAX) 0.4 MG CAPS capsule Take 0.4 mg by mouth daily.     thyroid (ARMOUR THYROID) 120 MG tablet Take 1 tablet (120 mg total) by mouth daily. 90 tablet 3   triamcinolone cream (KENALOG) 0.1 % Apply 1 application topically daily as needed (for rashes).      XARELTO 15 MG TABS tablet TAKE 1 TABLET (15 MG TOTAL) BY MOUTH DAILY WITH SUPPER. 90 tablet 1   No current facility-administered medications on file prior to visit.    Review of Systems:  As per HPI- otherwise negative.   Physical Examination: Vitals:   01/22/22 1521  BP: 140/76  Pulse: 97  Resp: 18  Temp: 97.8 F (36.6 C)  SpO2: 98%   Vitals:   01/22/22 1521  Weight: 209 lb 12.8 oz (95.2 kg)  Height: 5' 6"  (1.676 m)   Body mass index is 33.86 kg/m. Ideal Body Weight: Weight in (lb) to have BMI = 25: 154.6  GEN: no acute distress.  Elderly gentleman, robust build but unsteady on his feet.  Accompanied by his wife, uses a cane HEENT: Atraumatic, Normocephalic.  Ears and Nose: No external deformity. CV: RRR, systolic murmur c/w AVS. No JVD. No thrill.  No extra heart sounds. PULM: CTA B, no wheezes, crackles, rhonchi. No retractions. No resp. distress. No accessory muscle use. ABD: S, NT, ND, +BS. No rebound. No HSM. EXTR: No c/c/e PSYCH: Normally interactive. Conversant.   EKG: SR with rt BBB- compared with EKG from about one year ago no significant change noted  No orthostatic hypotension  Results for orders placed or performed in visit on 01/22/22  POCT URINALYSIS DIP (CLINITEK)  Result Value Ref Range   Color, UA yellow yellow   Clarity, UA clear clear   Glucose, UA >=1,000 (A) negative mg/dL   Bilirubin, UA negative negative   Ketones, POC UA negative negative mg/dL   Spec Grav, UA 1.015 1.010 - 1.025   Blood, UA small (A) negative   pH, UA 5.0 5.0 - 8.0   POC PROTEIN,UA negative negative, trace   Urobilinogen, UA 0.2 0.2 or 1.0 E.U./dL   Nitrite, UA Negative Negative   Leukocytes, UA Negative Negative   Patient is on SGLT2 medication Assessment and Plan: Aortic valve stenosis, etiology of cardiac valve disease unspecified  Diabetes mellitus without complication ([place]) - Plan: Microalbumin / creatinine urine ratio, Comprehensive metabolic panel, Hemoglobin A1c  Flank pain - Plan: POCT URINALYSIS DIP (CLINITEK)  Heart murmur - Plan: ECHOCARDIOGRAM COMPLETE  Bilateral carotid artery occlusion  Dizziness - Plan: EKG 12-Lead, CBC  Microhematuria - Plan: Urine Culture, Urine Microscopic Only  [name] is here today with concern of feeling weak and dizzy.  It sounds that he does have some  element of potentially BPPV, but I am also concerned he is having lightheadedness potentially related to his aortic stenosis.  His most recent echo was about 15 months ago.  I called and tried to arrange an echo but there is nothing available for at least 2 weeks.  Therefore, decided to reach out to his cardiologist Dr. Martinique and see if he can get an appointment.  I did schedule follow-up carotid ultrasound  Other routine lab work is pending  as above Urine culture pending, glycosuria due to [place] use We will obtain urine microscope exam due to microhematuria  Signed Lamar Blinks, MD  Addendum 7/17, received labs as below.  Message to patient  Results for orders placed or performed in visit on 01/22/22  Urine Culture   Specimen: Urine  Result Value Ref Range   MICRO NUMBER: [id number]    SPECIMEN QUALITY: Adequate    Sample Source URINE    STATUS: FINAL    Result: No Growth   Microalbumin / creatinine urine ratio  Result Value Ref Range   Microalb, Ur 1.8 0.0 - 1.9 mg/dL   Creatinine,U 77.3 mg/dL   Microalb Creat Ratio 2.4 0.0 - 30.0 mg/g  Urine Microscopic Only  Result Value Ref Range   WBC, UA 0-2/hpf 0-2/hpf   RBC / HPF 0-2/hpf 0-2/hpf   Squamous Epithelial / LPF Rare(0-4/hpf) Rare(0-4/hpf)  CBC  Result Value Ref Range   WBC 5.9 4.0 - 10.5 K/uL   RBC 4.26 4.22 - 5.81 Mil/uL   Platelets 114.0 (L) 150.0 - 400.0 K/uL   Hemoglobin 15.5 13.0 - 17.0 g/dL   HCT 45.3 39.0 - 52.0 %   MCV 106.3 (H) 78.0 - 100.0 fl   MCHC 34.2 30.0 - 36.0 g/dL   RDW 14.6 11.5 - 15.5 %  Comprehensive metabolic panel  Result Value Ref Range   Sodium 139 135 - 145 mEq/L   Potassium 4.6 3.5 - 5.1 mEq/L   Chloride 102 96 - 112 mEq/L   CO2 28 19 - 32 mEq/L   Glucose, Bld 107 (H) 70 - 99 mg/dL   BUN 18 6 - 23 mg/dL   Creatinine, Ser 1.67 (H) 0.40 - 1.50 mg/dL   Total Bilirubin 2.1 (H) 0.2 - 1.2 mg/dL   Alkaline Phosphatase 101 39 - 117 U/L   AST 44 (H) 0 - 37 U/L   ALT 36 0 - 53 U/L   Total Protein 7.6 6.0 - 8.3 g/dL   Albumin 4.2 3.5 - 5.2 g/dL   GFR 36.84 (L) >60.00 mL/min   Calcium 9.9 8.4 - 10.5 mg/dL  Hemoglobin A1c  Result Value Ref Range   Hgb A1c MFr Bld 6.0 4.6 - 6.5 %  POCT URINALYSIS DIP (CLINITEK)  Result Value Ref Range   Color, UA yellow yellow   Clarity, UA clear clear   Glucose, UA >=1,000 (A) negative mg/dL   Bilirubin, UA negative negative   Ketones, POC UA negative negative mg/dL   Spec Grav, UA  1.015 1.010 - 1.025   Blood, UA small (A) negative   pH, UA 5.0 5.0 - 8.0   POC PROTEIN,UA negative negative, trace   Urobilinogen, UA 0.2 0.2 or 1.0 E.U./dL   Nitrite, UA Negative Negative   Leukocytes, UA Negative Negative

## 2022-01-22 ENCOUNTER — Ambulatory Visit: Payer: Medicare Other | Admitting: Family Medicine

## 2022-01-22 VITALS — BP 120/80 | HR 97 | Temp 97.8°F | Resp 18 | Ht 66.0 in | Wt 209.8 lb

## 2022-01-22 DIAGNOSIS — R42 Dizziness and giddiness: Secondary | ICD-10-CM | POA: Diagnosis not present

## 2022-01-22 DIAGNOSIS — R011 Cardiac murmur, unspecified: Secondary | ICD-10-CM

## 2022-01-22 DIAGNOSIS — R109 Unspecified abdominal pain: Secondary | ICD-10-CM

## 2022-01-22 DIAGNOSIS — I6523 Occlusion and stenosis of bilateral carotid arteries: Secondary | ICD-10-CM

## 2022-01-22 DIAGNOSIS — I350 Nonrheumatic aortic (valve) stenosis: Secondary | ICD-10-CM | POA: Diagnosis not present

## 2022-01-22 DIAGNOSIS — E119 Type 2 diabetes mellitus without complications: Secondary | ICD-10-CM

## 2022-01-22 DIAGNOSIS — R3129 Other microscopic hematuria: Secondary | ICD-10-CM | POA: Diagnosis not present

## 2022-01-22 LAB — POCT URINALYSIS DIP (CLINITEK)
Bilirubin, UA: NEGATIVE
Glucose, UA: >=1000 mg/dL — AB
Ketones, POC UA: NEGATIVE mg/dL
Leukocytes, UA: NEGATIVE
Nitrite, UA: NEGATIVE
POC PROTEIN,UA: NEGATIVE
Spec Grav, UA: 1.015 (ref 1.010–1.025)
Urobilinogen, UA: 0.2 E.U./dL
pH, UA: 5 (ref 5.0–8.0)

## 2022-01-22 NOTE — Patient Instructions (Addendum)
It was good to see you today!   Appointment for carotid artery ultrasound 7/19 at 3:30 pm, [facility]  Address: [address] [place], [place] [address] Phone: [phone]  I am going to ask Dr Martinique to bring you in for a visit - I am concerned that your aortic valve stenosis may be contributing to your dizziness  Basic labs today

## 2022-01-23 ENCOUNTER — Telehealth: Payer: Self-pay

## 2022-01-23 ENCOUNTER — Other Ambulatory Visit: Payer: Self-pay

## 2022-01-23 DIAGNOSIS — I480 Paroxysmal atrial fibrillation: Secondary | ICD-10-CM

## 2022-01-23 DIAGNOSIS — I350 Nonrheumatic aortic (valve) stenosis: Secondary | ICD-10-CM

## 2022-01-23 LAB — CBC
HCT: 45.3 % (ref 39.0–52.0)
Hemoglobin: 15.5 g/dL (ref 13.0–17.0)
MCHC: 34.2 g/dL (ref 30.0–36.0)
MCV: 106.3 fl — ABNORMAL HIGH (ref 78.0–100.0)
Platelets: 114 10*3/uL — ABNORMAL LOW (ref 150.0–400.0)
RBC: 4.26 Mil/uL (ref 4.22–5.81)
RDW: 14.6 % (ref 11.5–15.5)
WBC: 5.9 10*3/uL (ref 4.0–10.5)

## 2022-01-23 LAB — COMPREHENSIVE METABOLIC PANEL
ALT: 36 U/L (ref 0–53)
AST: 44 U/L — ABNORMAL HIGH (ref 0–37)
Albumin: 4.2 g/dL (ref 3.5–5.2)
Alkaline Phosphatase: 101 U/L (ref 39–117)
BUN: 18 mg/dL (ref 6–23)
CO2: 28 mEq/L (ref 19–32)
Calcium: 9.9 mg/dL (ref 8.4–10.5)
Chloride: 102 mEq/L (ref 96–112)
Creatinine, Ser: 1.67 mg/dL — ABNORMAL HIGH (ref 0.40–1.50)
GFR: 36.84 mL/min — ABNORMAL LOW (ref >60.00)
Glucose, Bld: 107 mg/dL — ABNORMAL HIGH (ref 70–99)
Potassium: 4.6 mEq/L (ref 3.5–5.1)
Sodium: 139 mEq/L (ref 135–145)
Total Bilirubin: 2.1 mg/dL — ABNORMAL HIGH (ref 0.2–1.2)
Total Protein: 7.6 g/dL (ref 6.0–8.3)

## 2022-01-23 LAB — MICROALBUMIN / CREATININE URINE RATIO
Creatinine,U: 77.3 mg/dL
Microalb Creat Ratio: 2.4 mg/g (ref 0.0–30.0)
Microalb, Ur: 1.8 mg/dL (ref 0.0–1.9)

## 2022-01-23 LAB — URINALYSIS, MICROSCOPIC ONLY

## 2022-01-23 LAB — HEMOGLOBIN A1C: Hgb A1c MFr Bld: 6 % (ref 4.6–6.5)

## 2022-01-23 LAB — URINE CULTURE
MICRO NUMBER:: 13643060
Result:: NO GROWTH
SPECIMEN QUALITY:: ADEQUATE

## 2022-01-23 NOTE — Telephone Encounter (Signed)
Spoke to patient's wife echo scheduled 7/24 at 1:00 pm at [facility] location.Follow up appointment with Dr.Jordan scheduled 8/4 at 2:00 pm.

## 2022-01-23 NOTE — Progress Notes (Signed)
cho 

## 2022-01-23 NOTE — Telephone Encounter (Signed)
Called patient left message on personal voice mail to call me back to schedule echo and follow up appointment with Dr.Jordan.

## 2022-01-26 ENCOUNTER — Encounter: Payer: Self-pay | Admitting: Family Medicine

## 2022-01-28 ENCOUNTER — Encounter: Payer: Self-pay | Admitting: Family Medicine

## 2022-01-28 ENCOUNTER — Ambulatory Visit (HOSPITAL_COMMUNITY)
Admission: RE | Admit: 2022-01-28 | Discharge: 2022-01-28 | Disposition: A | Discharge status: Home / Self Care | Payer: Medicare Other | Source: Ambulatory Visit | Attending: Cardiovascular Disease | Admitting: Cardiovascular Disease

## 2022-01-28 DIAGNOSIS — I6523 Occlusion and stenosis of bilateral carotid arteries: Secondary | ICD-10-CM

## 2022-02-02 ENCOUNTER — Ambulatory Visit (INDEPENDENT_AMBULATORY_CARE_PROVIDER_SITE_OTHER): Payer: Medicare Other

## 2022-02-02 DIAGNOSIS — I350 Nonrheumatic aortic (valve) stenosis: Secondary | ICD-10-CM

## 2022-02-02 DIAGNOSIS — I480 Paroxysmal atrial fibrillation: Secondary | ICD-10-CM | POA: Diagnosis not present

## 2022-02-02 LAB — ECHOCARDIOGRAM COMPLETE
AR max vel: 0.93 cm2
AV Area VTI: 0.84 cm2
AV Area mean vel: 0.91 cm2
AV Mean grad: 13 mmHg
AV Peak grad: 23.2 mmHg
Ao pk vel: 2.41 m/s
Area-P 1/2: 3.6 cm2
S' Lateral: 2.64 cm

## 2022-02-02 MED ORDER — PERFLUTREN LIPID MICROSPHERE
1.0000 mL | INTRAVENOUS | Status: AC | PRN
  Administered 2022-02-02: 1 mL via INTRAVENOUS

## 2022-02-03 ENCOUNTER — Telehealth: Payer: Self-pay | Admitting: *Deleted

## 2022-02-03 NOTE — Telephone Encounter (Signed)
-----   Message from Peter M Martinique, MD sent at 02/03/2022  7:11 AM EDT ----- This study demonstrates:  Echo is stable. Normal LV function. Moderate LVH. Mild to moderate Aortic stenosis Medication changes / Follow up studies / Other recommendations:   None  Please send results to the PCP:  Copland, Gay Filler, MD  Peter Martinique, MD 02/03/2022 7:11 AM

## 2022-02-03 NOTE — Telephone Encounter (Signed)
Called left message for patient to call for results ,may ask to speak to [place] or Bandon LPN   routed result to Dr Lorelei Pont

## 2022-02-06 NOTE — Progress Notes (Signed)
Cardiology Office Note    Date:  02/13/2022   ID:  [name], DOB March 30, 1934, MRN [id number]  PCP:  [name], MD  Cardiologist:  Dr. Martinique  Chief Complaint  Patient presents with   Coronary Artery Disease    History of Present Illness:  [name] is a 88 y.o. male with PMH of CAD, prostate CA s/p radical prostatectomy 1998, HTN, HLD, h/o carotid artery disease s/p L CEA 1998, PAF, and TIA. Last seen by me in November 2020.    He was admitted to the hospital in 2015 for atypical chest pain, he was ruled out for MI. Cardiac catheterization demonstrated 70% stenosis in proximal RCA with FFR of 0.82, it was felt this lesion was not flow-limiting therefore he was treated medically. In March 2017, he presented with class III angina despite good medical therapy, repeat cardiac catheterization demonstrated a 70% proximal RCA with FFR of 0.76, this lesion was stented with drug-eluting stent. He did develop generalized weakness in September 2016 with arthralgias, this was attributed to statin which was stopped. Now he is on Zetia. He is also on thyroid replacement therapy for hypothyroidism.   He is followed by Dr. Scot Dock for Carotid artery disease and underwent left CEA. Evaluation in December 2016 showed 70-80% right ICA stenosis. He developed cellulitis of the right lower extremity in November 2017.     Patient was seen in the ED in September 2018 with atrial fibrillation and she was started on Xarelto at the time.  He returned in mid September 2018 with melena, Xarelto was held.  He was started on IV PPI.  Endoscopy revealed erosive gastritis. He was treated with Protonix.   Patient was seen on 05/11/2018 at which time he complained of exertional chest pain.  He was started on a trial of medical therapy was Imdur 30 mg daily.  He was felt to be mildly volume overloaded.  His losartan-HCTZ was stopped and he was started on losartan and Lasix 20 mg daily.  Echocardiogram obtained on  05/20/2018 showed EF 60 to 65%, grade 1 DD, mild aortic stenosis, mild MR, RVSP 42 mmHg.  Subsequent follow up noted he was stable.    He never started on the Imdur due to history of side effect associated with headache.    He notes difficulty with vision and hearing. Notes only SOB if walking up stairs. Using cane for balance. No longer drives. He does have chronic right flank pain.   Past Medical History:  Diagnosis Date   Anginal pain ([place])    Anxiety    " OCCASIONAL"   Arthritis    Asthma due to seasonal allergies    uses inhalers prn   Carotid artery occlusion    Left   Complication of anesthesia    " had tremors after prostate surgery   Coronary artery disease    Diverticulitis    recurrent   Family history of anesthesia complication    MOTHER    GERD (gastroesophageal reflux disease)    H/O hiatal hernia    Hearing aid worn    Hyperlipemia    Hypertension    Kidney stone    lithotripsy 5916 w complications, req stents, Dr Rosana Hoes   Prostate CA [facility])    Shortness of breath    Thyroid disease    TIA (transient ischemic attack)    Tinnitus     Past Surgical History:  Procedure Laterality Date   CARDIAC CATHETERIZATION  2008  minimal dz, Dr Cathie Olden   CARDIAC CATHETERIZATION N/A 10/09/2015   Procedure: Left Heart Cath and Coronary Angiography;  Surgeon: Valeska Haislip M Martinique, MD;  Location: [facility];  Service: Cardiovascular;  Laterality: N/A;   CARDIAC CATHETERIZATION N/A 10/09/2015   Procedure: Intravascular Pressure Wire/FFR Study;  Surgeon: Kraven Calk M Martinique, MD;  Location: [facility];  Service: Cardiovascular;  Laterality: N/A;   CARDIAC CATHETERIZATION N/A 10/09/2015   Procedure: Coronary Stent Intervention;  Surgeon: Carisha Kantor M Martinique, MD;  Location: [facility];  Service: Cardiovascular;  Laterality: N/A;   CAROTID ENDARTERECTOMY  Left   1998   CHOLECYSTECTOMY     CORONARY STENT PLACEMENT  10/09/2015   DES to RCA   ESOPHAGOGASTRODUODENOSCOPY (EGD)  WITH PROPOFOL Left 03/26/2017   Procedure: ESOPHAGOGASTRODUODENOSCOPY (EGD) WITH PROPOFOL;  Surgeon: Arta Silence, MD;  Location: [facility];  Service: Endoscopy;  Laterality: Left;   LEFT HEART CATHETERIZATION WITH CORONARY ANGIOGRAM N/A 09/15/2013   Procedure: LEFT HEART CATHETERIZATION WITH CORONARY ANGIOGRAM;  Surgeon: Zoa Dowty M Martinique, MD;  Location: [facility];  Service: Cardiovascular;  Laterality: N/A;   PROSTATECTOMY  1998   radical for prostate cancer    Current Medications: Outpatient Medications Prior to Visit  Medication Sig Dispense Refill   blood glucose meter kit and supplies KIT Dispense based on patient and insurance preference. Use up to four times daily as directed. 1 each 0   clobetasol (TEMOVATE) 0.05 % external solution Apply 1 application topically daily as needed (itching on scalp).  3   dapagliflozin propanediol (FARXIGA) 5 MG TABS tablet TAKE 1 TABLET BY MOUTH DAILY BEFORE BREAKFAST. 30 tablet 12   dorzolamide-timolol (COSOPT) 22.3-6.8 MG/ML ophthalmic solution Place 1 drop into the right eye 2 times daily.     ezetimibe (ZETIA) 10 MG tablet TAKE 1 TABLET BY MOUTH EVERY DAY 90 tablet 1   ipratropium (ATROVENT) 0.03 % nasal spray PLACE 2 SPRAYS INTO THE NOSE 2 (TWO) TIMES DAILY. 30 mL 6   Naphazoline-Pheniramine (OPCON-A) 0.027-0.315 % SOLN Place 1-2 drops into both eyes 3 (three) times daily as needed (for itching, redness, and/or irritation).     nitroGLYCERIN (NITROLINGUAL) 0.4 MG/SPRAY spray Place 1 spray under the tongue every 5 (five) minutes x 3 doses as needed for chest pain. 12 g 5   tamsulosin (FLOMAX) 0.4 MG CAPS capsule Take 0.4 mg by mouth daily.     thyroid (ARMOUR THYROID) 120 MG tablet Take 1 tablet (120 mg total) by mouth daily. 90 tablet 3   triamcinolone cream (KENALOG) 0.1 % Apply 1 application topically daily as needed (for rashes).      hydrocortisone 2.5 % cream Apply 1 application topically 2 (two) times daily as needed (itching).     XARELTO  15 MG TABS tablet TAKE 1 TABLET (15 MG TOTAL) BY MOUTH DAILY WITH SUPPER. 90 tablet 1   No facility-administered medications prior to visit.     Allergies:   Ciprofloxacin, Codeine, Flexeril [cyclobenzaprine], Imdur [isosorbide nitrate], Methocarbamol, Other, Strawberry extract, Sulfa antibiotics, Sulfamethoxazole, Sulfonamide derivatives, Tomato, and Flagyl [metronidazole]   Social History   Socioeconomic History   Marital status: Married    Spouse name: Not on file   Number of children: Not on file   Years of education: Not on file   Highest education level: Not on file  Occupational History   Occupation: retired    Comment: Actor [employer]  Tobacco Use   Smoking status: Former    Packs/day: 2.50  Years: 30.00    Total pack years: 75.00    Types: Cigarettes    Quit date: 08/28/1985    Years since quitting: 36.4   Smokeless tobacco: Never  Substance and Sexual Activity   Alcohol use: Yes    Alcohol/week: 2.0 standard drinks of alcohol    Types: 2 Standard drinks or equivalent per week    Comment: 1 glass wine/week (prior 1 glass martini with dinner)   Drug use: No   Sexual activity: Yes    Birth control/protection: None  Other Topics Concern   Not on file  Social History Narrative   Lives with wife--recently diagnosed with breast ca; No smoking; Occassional wine; Retired; 2 daughters live in [place]--5 grandchildren(boys). On weight watchers. Lives in [place] with wife. Retired Solicitor. Tobacco history 2 ppd x 32 years. No smoking x 25 years. No drugs.   Social Determinants of Health   Financial Resource Strain: Low Risk  (09/18/2019)   Overall Financial Resource Strain (CARDIA)    Difficulty of Paying Living Expenses: Not hard at all  Food Insecurity: No Food Insecurity (09/18/2019)   Hunger Vital Sign    Worried About Running Out of Food in the Last Year: Never true    Ran Out of Food in the Last Year: Never true  Transportation Needs:  No Transportation Needs (09/18/2019)   PRAPARE - Hydrologist (Medical): No    Lack of Transportation (Non-Medical): No  Physical Activity: Inactive (01/12/2022)   Exercise Vital Sign    Days of Exercise per Week: 0 days    Minutes of Exercise per Session: 0 min  Stress: No Stress Concern Present (01/12/2022)   [place]    Feeling of Stress : Not at all  Social Connections: Moderately Isolated (01/12/2022)   Social Connection and Isolation Panel [NHANES]    Frequency of Communication with Friends and Family: Three times a week    Frequency of Social Gatherings with Friends and Family: More than three times a week    Attends Religious Services: Never    Marine scientist or Organizations: No    Attends Music therapist: Never    Marital Status: Married     Family History:  The patient's family history includes CAD in his child; Dementia in his sister; Hashimoto's thyroiditis in his child; Heart attack in his sister; Heart disease in his father, mother, and sister; Kidney failure in his father; Sjogren's syndrome in his child.   ROS:   Please see the history of present illness.    ROS All other systems reviewed and are negative.   PHYSICAL EXAM:   VS:  BP 131/78   Pulse 79   Ht _0  (1.702 m)   Wt 208 lb (94.3 kg)   SpO2 95%   BMI 32.58 kg/m    GEN: Well nourished, obese, in no acute distress  HEENT: normal  Neck: no JVD, carotid bruits, or masses Cardiac: RRR; gr 2/6 systolic murmur RUSB rubs, or gallops, tr edema  Respiratory:  clear to auscultation bilaterally, normal work of breathing GI: obese, soft, nontender, nondistended, + BS MS: no deformity or atrophy  Skin: warm and dry, no rash Neuro:  Alert and Oriented x 3, Strength and sensation are intact Psych: euthymic mood, full affect  Wt Readings from Last 3 Encounters:  02/13/22 208 lb (94.3 kg)  01/22/22 209  lb 12.8 oz (95.2 kg)  10/13/21 206 lb 9.6 oz (93.7 kg)      Studies/Labs Reviewed:   EKG:  EKG is not ordered today.    Recent Labs: 10/13/2021: TSH 5.04 01/22/2022: ALT 36; BUN 18; Creatinine, Ser 1.67; Hemoglobin 15.5; Platelets 114.0; Potassium 4.6; Sodium 139   Lipid Panel    Component Value Date/Time   CHOL 162 10/13/2021 1419   CHOL 153 07/09/2017 0938   TRIG 292.0 (H) 10/13/2021 1419   HDL 29.80 (L) 10/13/2021 1419   HDL 38 (L) 07/09/2017 0938   CHOLHDL 5 10/13/2021 1419   VLDL 58.4 (H) 10/13/2021 1419   LDLCALC 77 09/14/2018 1117   LDLCALC 75 07/09/2017 0938   LDLDIRECT 102.0 10/13/2021 1419    Additional studies/ records that were reviewed today include:   Cath 10/09/2015 Mid RCA lesion, 40% stenosed. Prox LAD to Mid LAD lesion, 20% stenosed. Ost 1st Mrg to 1st Mrg lesion, 40% stenosed. The left ventricular systolic function is normal. Prox RCA lesion, 70% stenosed. Post intervention, there is a 0% residual stenosis.   1. Single vessel obstructive CAD 2. Abnormal FFR of the proximal RCA lesion 3. Normal LV function 4. Successful stenting of the proximal RCA with a DES   Plan: DAPT for one year. Anticipate DC in am.      Echo 05/20/2018 LV EF: 60% -   65% Study Conclusions   - Left ventricle: The cavity size was normal. Systolic function was   normal. The estimated ejection fraction was in the range of 60%   to 65%. Wall motion was normal; there were no regional wall   motion abnormalities. Doppler parameters are consistent with   abnormal left ventricular relaxation (grade 1 diastolic   dysfunction). Doppler parameters are consistent with high   ventricular filling pressure (E/e&' 22). - Aortic valve: Valve mobility was moderately restricted. There was   mild stenosis. There was no regurgitation. Mean gradient (S): 12   mm Hg. Valve area (VTI): 1.5 cm^2. Valve area (Vmax): 1.6 cm^2.   Valve area (Vmean): 1.48 cm^2. Maximum velocity and gradient    obtained from right sternal border. - Mitral valve: Moderately calcified annulus. Transvalvular   velocity was within the normal range. There was no evidence for   stenosis. There was mild regurgitation. Mean gradient (D): 3 mm   Hg (HR 61 bpm). - Left atrium: Poorly visualized. The atrium was moderately   dilated. - Right ventricle: Poorly visualized. The cavity size was normal.   Wall thickness was normal. Systolic function was normal. RV   systolic pressure (S, est): 42 mm Hg (using a right atrial   pressure estimate of 62mHg. This maybe underestimated due to   poorly visualized IVC). - Right atrium: Poorly visualized. The atrium was mildly dilated. - Tricuspid valve: There was trivial regurgitation. - Pulmonic valve: There was trivial regurgitation. - Pulmonary arteries: Systolic pressure was moderately increased. - Inferior vena cava: Poorly visualized. The vessel was mildly   dilated. - Pericardium, extracardiac: There was no pericardial effusion.   Impressions:   - Normal left ventricular function, EF 60-65%. Grossly normal   regiona wall motion. Elevated LV filling pressure. Mild aortic   stenosis, mean systolic gradient 12 mmHg. Moderate mitral annular   calcification. RVSP 42 mmHg, with RA pressure estimate of 10   mmHg, moderate pulmonary hypertension.  Echo 02/02/22: IMPRESSIONS     1. Calcified aortic valve with likely mild to moderate AS (mean gradient  13 mmHg; DI 0.33).   2. Left ventricular ejection  fraction, by estimation, is 60 to 65%. The  left ventricle has normal function. The left ventricle has no regional  wall motion abnormalities. There is moderate left ventricular hypertrophy.  Left ventricular diastolic  parameters are indeterminate. Elevated left atrial pressure.   3. Right ventricular systolic function is normal. The right ventricular  size is normal.   4. Left atrial size was moderately dilated.   5. The mitral valve is normal in structure. No  evidence of mitral valve  regurgitation. No evidence of mitral stenosis. Severe mitral annular  calcification.   6. The aortic valve is calcified. Aortic valve regurgitation is trivial.  Mild to moderate aortic valve stenosis.   7. The inferior vena cava is normal in size with greater than 50%  respiratory variability, suggesting right atrial pressure of 3 mmHg.   ASSESSMENT:    No diagnosis found.    PLAN:  In order of problems listed above:  CAD: s/p stenting of the RCA in 2017. No significant angina. Should be on ASA 81 mg daily. On no antianginal therapy. Activity is quite limited.   Hypertension: Blood pressure stable on current therapy  Hyperlipidemia: Intolerant to statins.  Currently on Zetia last LDL 102. Encourage healthy diet  Bilateral carotid artery disease: Followed by vascular surgery  Paroxysmal atrial fibrillation: patient reports he is no longer on Xarelto. Did have GI bleed in 2018. No symptomatic recurrence of Afib. Ecg in July showed NSR  History of TIA: No recurrence  7.   Mild to moderate aortic stenosis.    Medication Adjustments/Labs and Tests Ordered: Current medicines are reviewed at length with the patient today.  Concerns regarding medicines are outlined above.  Medication changes, Labs and Tests ordered today are listed in the Patient Instructions below. There are no Patient Instructions on file for this visit.   Signed, Hend Mccarrell Martinique, MD  02/13/2022 2:26 PM    [facility] [place], [place], [place]  [address] Phone: [phone]; Fax: [phone]

## 2022-02-13 ENCOUNTER — Ambulatory Visit: Payer: Medicare Other | Admitting: Cardiology

## 2022-02-13 ENCOUNTER — Encounter: Payer: Self-pay | Admitting: Cardiology

## 2022-02-13 VITALS — BP 131/78 | HR 79 | Ht 67.0 in | Wt 208.0 lb

## 2022-02-13 DIAGNOSIS — I25118 Atherosclerotic heart disease of native coronary artery with other forms of angina pectoris: Secondary | ICD-10-CM

## 2022-02-13 DIAGNOSIS — I350 Nonrheumatic aortic (valve) stenosis: Secondary | ICD-10-CM

## 2022-02-13 DIAGNOSIS — I6523 Occlusion and stenosis of bilateral carotid arteries: Secondary | ICD-10-CM | POA: Diagnosis not present

## 2022-02-13 DIAGNOSIS — I480 Paroxysmal atrial fibrillation: Secondary | ICD-10-CM

## 2022-02-13 NOTE — Patient Instructions (Signed)
Take a ASA 81 mg daily

## 2022-02-24 LAB — HM DIABETES EYE EXAM

## 2022-03-20 ENCOUNTER — Other Ambulatory Visit: Payer: Self-pay | Admitting: Family Medicine

## 2022-03-20 DIAGNOSIS — J300 Vasomotor rhinitis: Secondary | ICD-10-CM

## 2022-04-08 ENCOUNTER — Other Ambulatory Visit (HOSPITAL_COMMUNITY): Payer: Self-pay | Admitting: Family Medicine

## 2022-04-08 DIAGNOSIS — I779 Disorder of arteries and arterioles, unspecified: Secondary | ICD-10-CM

## 2022-05-10 ENCOUNTER — Other Ambulatory Visit: Payer: Self-pay | Admitting: Family Medicine

## 2022-05-10 DIAGNOSIS — E119 Type 2 diabetes mellitus without complications: Secondary | ICD-10-CM

## 2022-08-10 ENCOUNTER — Emergency Department (HOSPITAL_COMMUNITY): Payer: Medicare Other

## 2022-08-10 ENCOUNTER — Encounter (HOSPITAL_COMMUNITY): Payer: Self-pay

## 2022-08-10 ENCOUNTER — Other Ambulatory Visit: Payer: Self-pay

## 2022-08-10 ENCOUNTER — Inpatient Hospital Stay (HOSPITAL_COMMUNITY)
Admission: EM | Admit: 2022-08-10 | Discharge: 2022-08-20 | DRG: 243 | Disposition: A | Discharge status: Skilled Nursing Facility | Payer: Medicare Other | Attending: Internal Medicine | Admitting: Internal Medicine

## 2022-08-10 DIAGNOSIS — Z6832 Body mass index (BMI) 32.0-32.9, adult: Secondary | ICD-10-CM

## 2022-08-10 DIAGNOSIS — G9340 Encephalopathy, unspecified: Secondary | ICD-10-CM | POA: Clinically undetermined

## 2022-08-10 DIAGNOSIS — R4189 Other symptoms and signs involving cognitive functions and awareness: Secondary | ICD-10-CM | POA: Diagnosis present

## 2022-08-10 DIAGNOSIS — E039 Hypothyroidism, unspecified: Secondary | ICD-10-CM | POA: Diagnosis present

## 2022-08-10 DIAGNOSIS — K5730 Diverticulosis of large intestine without perforation or abscess without bleeding: Secondary | ICD-10-CM | POA: Diagnosis present

## 2022-08-10 DIAGNOSIS — E1122 Type 2 diabetes mellitus with diabetic chronic kidney disease: Secondary | ICD-10-CM | POA: Diagnosis present

## 2022-08-10 DIAGNOSIS — Z79899 Other long term (current) drug therapy: Secondary | ICD-10-CM

## 2022-08-10 DIAGNOSIS — Z841 Family history of disorders of kidney and ureter: Secondary | ICD-10-CM

## 2022-08-10 DIAGNOSIS — I4510 Unspecified right bundle-branch block: Secondary | ICD-10-CM | POA: Diagnosis present

## 2022-08-10 DIAGNOSIS — G4700 Insomnia, unspecified: Secondary | ICD-10-CM | POA: Diagnosis present

## 2022-08-10 DIAGNOSIS — I2510 Atherosclerotic heart disease of native coronary artery without angina pectoris: Secondary | ICD-10-CM | POA: Diagnosis present

## 2022-08-10 DIAGNOSIS — E119 Type 2 diabetes mellitus without complications: Secondary | ICD-10-CM | POA: Diagnosis present

## 2022-08-10 DIAGNOSIS — D6959 Other secondary thrombocytopenia: Secondary | ICD-10-CM | POA: Diagnosis present

## 2022-08-10 DIAGNOSIS — H833X9 Noise effects on inner ear, unspecified ear: Secondary | ICD-10-CM | POA: Diagnosis present

## 2022-08-10 DIAGNOSIS — S32018A Other fracture of first lumbar vertebra, initial encounter for closed fracture: Secondary | ICD-10-CM | POA: Diagnosis present

## 2022-08-10 DIAGNOSIS — E669 Obesity, unspecified: Secondary | ICD-10-CM | POA: Diagnosis present

## 2022-08-10 DIAGNOSIS — F411 Generalized anxiety disorder: Secondary | ICD-10-CM | POA: Diagnosis present

## 2022-08-10 DIAGNOSIS — E1165 Type 2 diabetes mellitus with hyperglycemia: Secondary | ICD-10-CM | POA: Diagnosis present

## 2022-08-10 DIAGNOSIS — R410 Disorientation, unspecified: Secondary | ICD-10-CM | POA: Diagnosis not present

## 2022-08-10 DIAGNOSIS — I2489 Other forms of acute ischemic heart disease: Secondary | ICD-10-CM | POA: Diagnosis present

## 2022-08-10 DIAGNOSIS — I441 Atrioventricular block, second degree: Secondary | ICD-10-CM | POA: Diagnosis present

## 2022-08-10 DIAGNOSIS — Z8042 Family history of malignant neoplasm of prostate: Secondary | ICD-10-CM

## 2022-08-10 DIAGNOSIS — Z974 Presence of external hearing-aid: Secondary | ICD-10-CM

## 2022-08-10 DIAGNOSIS — I442 Atrioventricular block, complete: Secondary | ICD-10-CM | POA: Diagnosis not present

## 2022-08-10 DIAGNOSIS — M62830 Muscle spasm of back: Secondary | ICD-10-CM | POA: Diagnosis present

## 2022-08-10 DIAGNOSIS — R55 Syncope and collapse: Secondary | ICD-10-CM

## 2022-08-10 DIAGNOSIS — Z87891 Personal history of nicotine dependence: Secondary | ICD-10-CM

## 2022-08-10 DIAGNOSIS — K7460 Unspecified cirrhosis of liver: Secondary | ICD-10-CM | POA: Diagnosis present

## 2022-08-10 DIAGNOSIS — Z8546 Personal history of malignant neoplasm of prostate: Secondary | ICD-10-CM

## 2022-08-10 DIAGNOSIS — E785 Hyperlipidemia, unspecified: Secondary | ICD-10-CM | POA: Diagnosis present

## 2022-08-10 DIAGNOSIS — Z8249 Family history of ischemic heart disease and other diseases of the circulatory system: Secondary | ICD-10-CM

## 2022-08-10 DIAGNOSIS — Z1152 Encounter for screening for COVID-19: Secondary | ICD-10-CM

## 2022-08-10 DIAGNOSIS — Z9049 Acquired absence of other specified parts of digestive tract: Secondary | ICD-10-CM

## 2022-08-10 DIAGNOSIS — I455 Other specified heart block: Secondary | ICD-10-CM

## 2022-08-10 DIAGNOSIS — N1832 Chronic kidney disease, stage 3b: Secondary | ICD-10-CM | POA: Diagnosis present

## 2022-08-10 DIAGNOSIS — Z888 Allergy status to other drugs, medicaments and biological substances status: Secondary | ICD-10-CM

## 2022-08-10 DIAGNOSIS — Z72821 Inadequate sleep hygiene: Secondary | ICD-10-CM | POA: Diagnosis present

## 2022-08-10 DIAGNOSIS — I080 Rheumatic disorders of both mitral and aortic valves: Secondary | ICD-10-CM | POA: Diagnosis present

## 2022-08-10 DIAGNOSIS — N400 Enlarged prostate without lower urinary tract symptoms: Secondary | ICD-10-CM | POA: Diagnosis present

## 2022-08-10 DIAGNOSIS — G4733 Obstructive sleep apnea (adult) (pediatric): Secondary | ICD-10-CM | POA: Diagnosis present

## 2022-08-10 DIAGNOSIS — I480 Paroxysmal atrial fibrillation: Secondary | ICD-10-CM | POA: Diagnosis present

## 2022-08-10 DIAGNOSIS — I5032 Chronic diastolic (congestive) heart failure: Secondary | ICD-10-CM | POA: Diagnosis present

## 2022-08-10 DIAGNOSIS — I130 Hypertensive heart and chronic kidney disease with heart failure and stage 1 through stage 4 chronic kidney disease, or unspecified chronic kidney disease: Secondary | ICD-10-CM | POA: Diagnosis present

## 2022-08-10 DIAGNOSIS — Z881 Allergy status to other antibiotic agents status: Secondary | ICD-10-CM

## 2022-08-10 DIAGNOSIS — Z91199 Patient's noncompliance with other medical treatment and regimen due to unspecified reason: Secondary | ICD-10-CM

## 2022-08-10 DIAGNOSIS — R2681 Unsteadiness on feet: Secondary | ICD-10-CM | POA: Diagnosis present

## 2022-08-10 DIAGNOSIS — Z8673 Personal history of transient ischemic attack (TIA), and cerebral infarction without residual deficits: Secondary | ICD-10-CM

## 2022-08-10 DIAGNOSIS — Z91018 Allergy to other foods: Secondary | ICD-10-CM

## 2022-08-10 DIAGNOSIS — R42 Dizziness and giddiness: Secondary | ICD-10-CM

## 2022-08-10 DIAGNOSIS — Z9102 Food additives allergy status: Secondary | ICD-10-CM

## 2022-08-10 DIAGNOSIS — S0990XA Unspecified injury of head, initial encounter: Secondary | ICD-10-CM

## 2022-08-10 DIAGNOSIS — Z955 Presence of coronary angioplasty implant and graft: Secondary | ICD-10-CM

## 2022-08-10 DIAGNOSIS — D7589 Other specified diseases of blood and blood-forming organs: Secondary | ICD-10-CM | POA: Diagnosis present

## 2022-08-10 DIAGNOSIS — D539 Nutritional anemia, unspecified: Secondary | ICD-10-CM | POA: Diagnosis present

## 2022-08-10 DIAGNOSIS — M1990 Unspecified osteoarthritis, unspecified site: Secondary | ICD-10-CM | POA: Diagnosis present

## 2022-08-10 DIAGNOSIS — Z882 Allergy status to sulfonamides status: Secondary | ICD-10-CM

## 2022-08-10 DIAGNOSIS — I10 Essential (primary) hypertension: Secondary | ICD-10-CM | POA: Diagnosis present

## 2022-08-10 DIAGNOSIS — S0003XA Contusion of scalp, initial encounter: Secondary | ICD-10-CM | POA: Diagnosis present

## 2022-08-10 DIAGNOSIS — Z885 Allergy status to narcotic agent status: Secondary | ICD-10-CM

## 2022-08-10 DIAGNOSIS — K219 Gastro-esophageal reflux disease without esophagitis: Secondary | ICD-10-CM | POA: Diagnosis present

## 2022-08-10 DIAGNOSIS — Z9079 Acquired absence of other genital organ(s): Secondary | ICD-10-CM

## 2022-08-10 DIAGNOSIS — W19XXXA Unspecified fall, initial encounter: Secondary | ICD-10-CM | POA: Diagnosis present

## 2022-08-10 DIAGNOSIS — H9193 Unspecified hearing loss, bilateral: Secondary | ICD-10-CM | POA: Diagnosis present

## 2022-08-10 DIAGNOSIS — Z79890 Hormone replacement therapy (postmenopausal): Secondary | ICD-10-CM

## 2022-08-10 LAB — COMPREHENSIVE METABOLIC PANEL
ALT: 25 U/L (ref 0–44)
AST: 43 U/L — ABNORMAL HIGH (ref 15–41)
Albumin: 3.6 g/dL (ref 3.5–5.0)
Alkaline Phosphatase: 91 U/L (ref 38–126)
Anion gap: 9 (ref 5–15)
BUN: 13 mg/dL (ref 8–23)
CO2: 24 mmol/L (ref 22–32)
Calcium: 8.8 mg/dL — ABNORMAL LOW (ref 8.9–10.3)
Chloride: 105 mmol/L (ref 98–111)
Creatinine, Ser: 1.46 mg/dL — ABNORMAL HIGH (ref 0.61–1.24)
GFR, Estimated: 47 mL/min — ABNORMAL LOW (ref >60)
Glucose, Bld: 97 mg/dL (ref 70–99)
Potassium: 3.6 mmol/L (ref 3.5–5.1)
Sodium: 138 mmol/L (ref 135–145)
Total Bilirubin: 2.2 mg/dL — ABNORMAL HIGH (ref 0.3–1.2)
Total Protein: 6.7 g/dL (ref 6.5–8.1)

## 2022-08-10 LAB — CBC WITH DIFFERENTIAL/PLATELET
Abs Immature Granulocytes: 0.16 10*3/uL — ABNORMAL HIGH (ref 0.00–0.07)
Basophils Absolute: 0.1 10*3/uL (ref 0.0–0.1)
Basophils Relative: 2 %
Eosinophils Absolute: 0.5 10*3/uL (ref 0.0–0.5)
Eosinophils Relative: 11 %
HCT: 39.3 % (ref 39.0–52.0)
Hemoglobin: 13.6 g/dL (ref 13.0–17.0)
Immature Granulocytes: 4 %
Lymphocytes Relative: 18 %
Lymphs Abs: 0.8 10*3/uL (ref 0.7–4.0)
MCH: 35.4 pg — ABNORMAL HIGH (ref 26.0–34.0)
MCHC: 34.6 g/dL (ref 30.0–36.0)
MCV: 102.3 fL — ABNORMAL HIGH (ref 80.0–100.0)
Monocytes Absolute: 0.6 10*3/uL (ref 0.1–1.0)
Monocytes Relative: 12 %
Neutro Abs: 2.4 10*3/uL (ref 1.7–7.7)
Neutrophils Relative %: 53 %
Platelets: 95 10*3/uL — ABNORMAL LOW (ref 150–400)
RBC: 3.84 MIL/uL — ABNORMAL LOW (ref 4.22–5.81)
RDW: 15.2 % (ref 11.5–15.5)
WBC: 4.4 10*3/uL (ref 4.0–10.5)
nRBC: 0 % (ref 0.0–0.2)

## 2022-08-10 LAB — TROPONIN I (HIGH SENSITIVITY)
Troponin I (High Sensitivity): 27 ng/L — ABNORMAL HIGH (ref <18)
Troponin I (High Sensitivity): 36 ng/L — ABNORMAL HIGH (ref <18)

## 2022-08-10 LAB — RESP PANEL BY RT-PCR (RSV, FLU A&B, COVID)  RVPGX2
Influenza A by PCR: NEGATIVE
Influenza B by PCR: NEGATIVE
Resp Syncytial Virus by PCR: NEGATIVE
SARS Coronavirus 2 by RT PCR: NEGATIVE

## 2022-08-10 MED ORDER — LACTATED RINGERS IV SOLN: INTRAVENOUS | Status: AC

## 2022-08-10 MED ORDER — IOHEXOL 350 MG/ML SOLN
75.0000 mL | Freq: Once | INTRAVENOUS | Status: AC | PRN
  Administered 2022-08-10: 75 mL via INTRAVENOUS

## 2022-08-10 MED ORDER — FENTANYL CITRATE PF 50 MCG/ML IJ SOSY
50.0000 ug | PREFILLED_SYRINGE | INTRAMUSCULAR | Status: DC | PRN
  Administered 2022-08-10: 50 ug via INTRAVENOUS
  Filled 2022-08-10: qty 1

## 2022-08-10 MED ORDER — FENTANYL CITRATE PF 50 MCG/ML IJ SOSY
100.0000 ug | PREFILLED_SYRINGE | INTRAMUSCULAR | Status: DC | PRN
  Administered 2022-08-10: 100 ug via INTRAVENOUS
  Filled 2022-08-10: qty 2

## 2022-08-10 NOTE — H&P (Signed)
History and Physical  [name] XNA:[id number] DOB: 1933-09-08 DOA: 08/10/2022  Referring physician: Dr. Laverta Baltimore, [place]. PCP: [name], MD  Outpatient Specialists: Cardiology, ophthalmology. Patient coming from: Home, lives with his wife.  Chief Complaint: Passed out twice within the past 24 hours.  HPI: [name] is a 89 y.o. male with medical history significant for chronic dizziness, feeling of being off balance x 2 years, coronary artery disease status post PCI with stenting, paroxysmal A-fib, previously on Xarelto and no longer on DOAC after GI bleed, prostate cancer status post radical prostatectomy in 1998, hypertension, hyperlipidemia with intolerance to statin, history of carotid artery disease status post left carotid endarterectomy in 1998, OSA on CPAP, and TIA, who presented to [facility] ED from home via EMS due to 2 syncopal episodes within 24-hour.  The first syncopal episode occurred yesterday.  He felt his left side when out and passed out.  No chest pain or palpitations.  Denies feeling dizzy prior to passing out.  Today he had a similar episode in which he felt his left side went out.  This time, he passed out and hit the back of his head.  No post ictal symptoms.  EMS was activated.  In the ED, trauma scan was unremarkable.  His lab studies were notable for elevated troponin of 27.  While in the ED he had intermittent muscles spasms in his mid back.  Endorses he was started on Farxiga a few months.  No other new medications.  Denies dysuria.  Admits to increased urinary frequency.  TRH, hospitalist service, was asked to admit   ED Course: Tmax 97.6.  BP 168/87, pulse 92, respiratory rate 13, O2 saturation 98% on room air.  Influenza A by PCR negative, influenza B by PCR negative, RSV by PCR negative, COVID-19 by PCR negative.  Platelet count 95.  Review of Systems: Review of systems as noted in the HPI. All other systems reviewed and are negative.   Past Medical  History:  Diagnosis Date   Anginal pain ([place])    Anxiety    " OCCASIONAL"   Arthritis    Asthma due to seasonal allergies    uses inhalers prn   Carotid artery occlusion    Left   Complication of anesthesia    " had tremors after prostate surgery   Coronary artery disease    Diverticulitis    recurrent   Family history of anesthesia complication    MOTHER    GERD (gastroesophageal reflux disease)    H/O hiatal hernia    Hearing aid worn    Hyperlipemia    Hypertension    Kidney stone    lithotripsy 5427 w complications, req stents, Dr Rosana Hoes   Prostate CA [facility])    Shortness of breath    Thyroid disease    TIA (transient ischemic attack)    Tinnitus    Past Surgical History:  Procedure Laterality Date   CARDIAC CATHETERIZATION  2008   minimal dz, Dr Cathie Olden   CARDIAC CATHETERIZATION N/A 10/09/2015   Procedure: Left Heart Cath and Coronary Angiography;  Surgeon: Peter M Martinique, MD;  Location: [place] CV LAB;  Service: Cardiovascular;  Laterality: N/A;   CARDIAC CATHETERIZATION N/A 10/09/2015   Procedure: Intravascular Pressure Wire/FFR Study;  Surgeon: Peter M Martinique, MD;  Location: [place] [facility];  Service: Cardiovascular;  Laterality: N/A;   CARDIAC CATHETERIZATION N/A 10/09/2015   Procedure: Coronary Stent Intervention;  Surgeon: Peter M Martinique, MD;  Location:  [place] INVASIVE CV LAB;  Service: Cardiovascular;  Laterality: N/A;   CAROTID ENDARTERECTOMY  Left   1998   CHOLECYSTECTOMY     CORONARY STENT PLACEMENT  10/09/2015   DES to RCA   ESOPHAGOGASTRODUODENOSCOPY (EGD) WITH PROPOFOL Left 03/26/2017   Procedure: ESOPHAGOGASTRODUODENOSCOPY (EGD) WITH PROPOFOL;  Surgeon: Arta Silence, MD;  Location: [facility] ENDOSCOPY;  Service: Endoscopy;  Laterality: Left;   LEFT HEART CATHETERIZATION WITH CORONARY ANGIOGRAM N/A 09/15/2013   Procedure: LEFT HEART CATHETERIZATION WITH CORONARY ANGIOGRAM;  Surgeon: Peter M Martinique, MD;  Location: [facility] CATH LAB;  Service: Cardiovascular;   Laterality: N/A;   PROSTATECTOMY  1998   radical for prostate cancer    Social History:  reports that he quit smoking about 36 years ago. His smoking use included cigarettes. He has a 75.00 pack-year smoking history. He has never used smokeless tobacco. He reports current alcohol use of about 2.0 standard drinks of alcohol per week. He reports that he does not use drugs.   Allergies  Allergen Reactions   Ciprofloxacin Other (See Comments)    Hallucinations or jitteriness   Codeine Other (See Comments)    Hallucinations    Flexeril [Cyclobenzaprine] Other (See Comments)    "Made me feel goofy"   Imdur [Isosorbide Nitrate] Other (See Comments)    Caused headaches   Methocarbamol Other (See Comments)    "Made me feel goofy"   Other Other (See Comments)    CANNOT HAVE ANY FOODS WITH SEEDS    Strawberry Extract Other (See Comments)    CANNOT HAVE ANY FOODS WITH SEEDS   Sulfa Antibiotics Other (See Comments)    Chills and shaking- "serum sickness"    Sulfamethoxazole Other (See Comments)    Chills and shaking- "serum sickness"   Sulfonamide Derivatives Other (See Comments)    Chills and shaking "serum sickness"   Tomato Other (See Comments)    CANNOT HAVE ANY FOODS WITH SEEDS   Flagyl [Metronidazole] Rash    Family History  Problem Relation Age of Onset   Heart disease Mother    Heart disease Father    Kidney failure Father    Heart disease Sister    Heart attack Sister    Dementia Sister    Sjogren's syndrome Child    Hashimoto's thyroiditis Child    CAD Child       Prior to Admission medications   Medication Sig Start Date End Date Taking? Authorizing Provider  blood glucose meter kit and supplies KIT Dispense based on patient and insurance preference. Use up to four times daily as directed. 09/10/20   Geradine Girt, DO  clobetasol (TEMOVATE) 0.05 % external solution Apply 1 application topically daily as needed (itching on scalp). 10/06/17   [provider]   dorzolamide-timolol (COSOPT) 22.3-6.8 MG/ML ophthalmic solution Place 1 drop into the right eye 2 times daily. 01/16/22   [provider]  ezetimibe (ZETIA) 10 MG tablet TAKE 1 TABLET BY MOUTH EVERY DAY 09/27/20   Martinique, Peter M, MD  FARXIGA 5 MG TABS tablet TAKE 1 TABLET BY MOUTH EVERY DAY BEFORE BREAKFAST 05/11/22   Copland, Gay Filler, MD  ipratropium (ATROVENT) 0.03 % nasal spray Place 2 sprays into the nose 2 (two) times daily. 03/20/22   Copland, Gay Filler, MD  Naphazoline-Pheniramine (OPCON-A) 0.027-0.315 % SOLN Place 1-2 drops into both eyes 3 (three) times daily as needed (for itching, redness, and/or irritation).    [provider]  nitroGLYCERIN (NITROLINGUAL) 0.4 MG/SPRAY spray Place 1 spray under  the tongue every 5 (five) minutes x 3 doses as needed for chest pain. 07/18/18   Martinique, Peter M, MD  tamsulosin (FLOMAX) 0.4 MG CAPS capsule Take 0.4 mg by mouth daily. 09/16/20   [provider]  thyroid (ARMOUR THYROID) 120 MG tablet Take 1 tablet (120 mg total) by mouth daily. 10/13/21   Copland, Gay Filler, MD  triamcinolone cream (KENALOG) 0.1 % Apply 1 application topically daily as needed (for rashes).     [provider]    Physical Exam: BP (!) 168/87   Pulse 92   Temp 97.6 F (36.4 C) (Oral)   Resp 13   Ht '5\' 6"'$  (1.676 m)   Wt 91.6 kg   SpO2 98%   BMI 32.60 kg/m   General: 89 y.o. year-old male well developed well nourished in no acute distress.  Alert and oriented x3. Cardiovascular: Regular rate and rhythm with no rubs or gallops.  No thyromegaly or JVD noted.  No lower extremity edema. 2/4 pulses in all 4 extremities. Respiratory: Clear to auscultation with no wheezes or rales. Good inspiratory effort. Abdomen: Soft nontender nondistended with normal bowel sounds x4 quadrants. Muskuloskeletal: No cyanosis, clubbing or edema noted bilaterally Neuro: CN II-XII intact, strength, sensation, reflexes Skin: No ulcerative lesions noted or  rashes Psychiatry: Judgement and insight appear normal. Mood is appropriate for condition and setting          Labs on Admission:  Basic Metabolic Panel: Recent Labs  Lab 08/10/22 1645  NA 138  K 3.6  CL 105  CO2 24  GLUCOSE 97  BUN 13  CREATININE 1.46*  CALCIUM 8.8*   Liver Function Tests: Recent Labs  Lab 08/10/22 1645  AST 43*  ALT 25  ALKPHOS 91  BILITOT 2.2*  PROT 6.7  ALBUMIN 3.6   No results for input(s): "LIPASE", "AMYLASE" in the last 168 hours. No results for input(s): "AMMONIA" in the last 168 hours. CBC: Recent Labs  Lab 08/10/22 1645  WBC 4.4  NEUTROABS 2.4  HGB 13.6  HCT 39.3  MCV 102.3*  PLT 95*   Cardiac Enzymes: No results for input(s): "CKTOTAL", "CKMB", "CKMBINDEX", "TROPONINI" in the last 168 hours.  BNP (last 3 results) No results for input(s): "BNP" in the last 8760 hours.  ProBNP (last 3 results) No results for input(s): "PROBNP" in the last 8760 hours.  CBG: No results for input(s): "GLUCAP" in the last 168 hours.  Radiological Exams on Admission: CT Head Wo Contrast  Result Date: 08/10/2022 CLINICAL DATA:  Fall, hit head on floor, hematoma to back of head EXAM: CT HEAD WITHOUT CONTRAST CT CERVICAL SPINE WITHOUT CONTRAST TECHNIQUE: Multidetector CT imaging of the head and cervical spine was performed following the standard protocol without intravenous contrast. Multiplanar CT image reconstructions of the cervical spine were also generated. RADIATION DOSE REDUCTION: This exam was performed according to the departmental dose-optimization program which includes automated exposure control, adjustment of the mA and/or kV according to patient size and/or use of iterative reconstruction technique. COMPARISON:  None Available. FINDINGS: CT HEAD FINDINGS Brain: No evidence of acute infarction, hemorrhage, hydrocephalus, extra-axial collection or mass lesion/mass effect. Periventricular and deep white matter hypodensity. Vascular: No hyperdense  vessel or unexpected calcification. Skull: Normal. Negative for fracture or focal lesion. Sinuses/Orbits: No acute finding. Other: Soft tissue contusion of the scalp vertex (series 4, image 8). CT CERVICAL SPINE FINDINGS Alignment: Normal. Skull base and vertebrae: No acute fracture. No primary bone lesion or focal pathologic process. Soft tissues  and spinal canal: No prevertebral fluid or swelling. No visible canal hematoma. Disc levels: Focally moderate disc space height loss and osteophytosis of C5-C7 with otherwise preserved disc spaces. Upper chest: Negative. Other: None. IMPRESSION: 1. No acute intracranial pathology. Small-vessel white matter disease. 2. Soft tissue contusion of the scalp vertex. 3. No fracture or static subluxation of the cervical spine. Electronically Signed   By: Delanna Ahmadi M.D.   On: 08/10/2022 19:20   CT Cervical Spine Wo Contrast  Result Date: 08/10/2022 CLINICAL DATA:  Fall, hit head on floor, hematoma to back of head EXAM: CT HEAD WITHOUT CONTRAST CT CERVICAL SPINE WITHOUT CONTRAST TECHNIQUE: Multidetector CT imaging of the head and cervical spine was performed following the standard protocol without intravenous contrast. Multiplanar CT image reconstructions of the cervical spine were also generated. RADIATION DOSE REDUCTION: This exam was performed according to the departmental dose-optimization program which includes automated exposure control, adjustment of the mA and/or kV according to patient size and/or use of iterative reconstruction technique. COMPARISON:  None Available. FINDINGS: CT HEAD FINDINGS Brain: No evidence of acute infarction, hemorrhage, hydrocephalus, extra-axial collection or mass lesion/mass effect. Periventricular and deep white matter hypodensity. Vascular: No hyperdense vessel or unexpected calcification. Skull: Normal. Negative for fracture or focal lesion. Sinuses/Orbits: No acute finding. Other: Soft tissue contusion of the scalp vertex (series 4,  image 8). CT CERVICAL SPINE FINDINGS Alignment: Normal. Skull base and vertebrae: No acute fracture. No primary bone lesion or focal pathologic process. Soft tissues and spinal canal: No prevertebral fluid or swelling. No visible canal hematoma. Disc levels: Focally moderate disc space height loss and osteophytosis of C5-C7 with otherwise preserved disc spaces. Upper chest: Negative. Other: None. IMPRESSION: 1. No acute intracranial pathology. Small-vessel white matter disease. 2. Soft tissue contusion of the scalp vertex. 3. No fracture or static subluxation of the cervical spine. Electronically Signed   By: Delanna Ahmadi M.D.   On: 08/10/2022 19:20   CT CHEST ABDOMEN PELVIS W CONTRAST  Result Date: 08/10/2022 CLINICAL DATA:  Fall, syncope, hit head, complains of thoracic back pain to palpation, history of prostate cancer * Tracking Code: BO * EXAM: CT CHEST, ABDOMEN, AND PELVIS WITH CONTRAST CT THORACIC AND LUMBAR SPINE WITH CONTRAST TECHNIQUE: Multidetector CT imaging of the chest, abdomen and pelvis was performed following the standard protocol during bolus administration of intravenous contrast. Multidetector CT imaging of the thoracic and lumbar spine was performed following the standard protocol during bolus administration of intravenous contrast. RADIATION DOSE REDUCTION: This exam was performed according to the departmental dose-optimization program which includes automated exposure control, adjustment of the mA and/or kV according to patient size and/or use of iterative reconstruction technique. CONTRAST:  24m OMNIPAQUE IOHEXOL 350 MG/ML SOLN COMPARISON:  CT abdomen pelvis, 10/18/2020 FINDINGS: CT CHEST FINDINGS Cardiovascular: Aortic atherosclerosis. Dense aortic valve calcifications. Normal heart size. Three-vessel coronary artery calcifications. Very dense mitral annulus calcifications. No pericardial effusion. Mediastinum/Nodes: No enlarged mediastinal, hilar, or axillary lymph nodes. Small hiatal  hernia. Thyroid gland, trachea, and esophagus demonstrate no significant findings. Lungs/Pleura: Mild paraseptal emphysema. Diffuse bilateral bronchial wall thickening. Dependent bibasilar scarring and or atelectasis. No pleural effusion or pneumothorax. Musculoskeletal: No chest wall mass or suspicious osseous lesions identified. CT ABDOMEN PELVIS FINDINGS Hepatobiliary: No solid liver abnormality is seen. Small benign left lobe liver cysts, for which no further follow-up or characterization is required. Coarse nodular cirrhotic morphology of the liver. Status post cholecystectomy. No biliary ductal dilatation. Pancreas: Unremarkable. No pancreatic ductal dilatation or surrounding  inflammatory changes. Spleen: Splenomegaly, maximum coronal span 15.0 cm. Adrenals/Urinary Tract: Adrenal glands are unremarkable. Severely atrophic right kidney with a small nonobstructive calculus of the midportion. Left kidney is normal. No ureteral calculi or hydronephrosis. Bladder is unremarkable. Stomach/Bowel: Stomach is within normal limits. Appendix appears normal. No evidence of bowel wall thickening, distention, or inflammatory changes. Descending and sigmoid diverticulosis. Vascular/Lymphatic: Aortic atherosclerosis. 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 No enlarged abdominal or pelvic lymph nodes. Reproductive: Status post prostatectomy. Other: Small fat containing bilateral inguinal hernias.  No ascites. Musculoskeletal: No acute osseous findings. CT THORACIC AND LUMBAR SPINE FINDINGS Alignment: Gentle dextroscoliosis of the thoracic spine, apex T6, with otherwise normal thoracic kyphosis. Normal lumbar lordosis. Vertebral bodies: Osteopenia. Probable very subtle superior endplate deformity of L1 with no significant anterior height loss (series 6, image 34). Unchanged, chronic Schmorl deformity of the superior endplate of L3 (series 6, image 41). No fracture or dislocation.  Disc spaces: Mild multilevel disc space height loss and osteophytosis throughout the thoracic and lumbar spine, with bridging osteophytosis of the mid to lower thoracic spine in keeping with DISH. Paraspinous soft tissues: Unremarkable. IMPRESSION: 1. No CT evidence of acute traumatic injury to the chest, abdomen, or pelvis. 2. Probable very subtle superior endplate deformity of L1 with no significant anterior height loss. Correlate for acute point tenderness. 3. Osteopenia. 4. Coronary artery disease. 5. Dense aortic valve calcifications. Correlate for echocardiographic evidence of aortic valve dysfunction. 6. Cirrhosis and splenomegaly. 7. Severely atrophic right kidney with a small nonobstructive calculus of the midportion. 8. Descending and sigmoid diverticulosis without evidence of acute diverticulitis. 9. Status post prostatectomy. No evidence of lymphadenopathy or metastatic disease in the abdomen or pelvis. 10. Aortic atherosclerosis. 11. Emphysema. Aortic Atherosclerosis (ICD10-I70.0) and Emphysema (ICD10-J43.9). Electronically Signed   By: Delanna Ahmadi M.D.   On: 08/10/2022 19:16   CT L-SPINE NO CHARGE  Result Date: 08/10/2022 CLINICAL DATA:  Fall, syncope, hit head, complains of thoracic back pain to palpation, history of prostate cancer * Tracking Code: BO * EXAM: CT CHEST, ABDOMEN, AND PELVIS WITH CONTRAST CT THORACIC AND LUMBAR SPINE WITH CONTRAST TECHNIQUE: Multidetector CT imaging of the chest, abdomen and pelvis was performed following the standard protocol during bolus administration of intravenous contrast. Multidetector CT imaging of the thoracic and lumbar spine was performed following the standard protocol during bolus administration of intravenous contrast. RADIATION DOSE REDUCTION: This exam was performed according to the departmental dose-optimization program which includes automated exposure control, adjustment of the mA and/or kV according to patient size and/or use of iterative  reconstruction technique. CONTRAST:  55m OMNIPAQUE IOHEXOL 350 MG/ML SOLN COMPARISON:  CT abdomen pelvis, 10/18/2020 FINDINGS: CT CHEST FINDINGS Cardiovascular: Aortic atherosclerosis. Dense aortic valve calcifications. Normal heart size. Three-vessel coronary artery calcifications. Very dense mitral annulus calcifications. No pericardial effusion. Mediastinum/Nodes: No enlarged mediastinal, hilar, or axillary lymph nodes. Small hiatal hernia. Thyroid gland, trachea, and esophagus demonstrate no significant findings. Lungs/Pleura: Mild paraseptal emphysema. Diffuse bilateral bronchial wall thickening. Dependent bibasilar scarring and or atelectasis. No pleural effusion or pneumothorax. Musculoskeletal: No chest wall mass or suspicious osseous lesions identified. CT ABDOMEN PELVIS FINDINGS Hepatobiliary: No solid liver abnormality is seen. Small benign left lobe liver cysts, for which no further follow-up or characterization is required. Coarse nodular cirrhotic morphology of the liver. Status post cholecystectomy. No biliary ductal dilatation. Pancreas: Unremarkable. No pancreatic ductal dilatation or surrounding inflammatory changes. Spleen: Splenomegaly, maximum coronal span 15.0 cm. Adrenals/Urinary Tract: Adrenal glands  are unremarkable. Severely atrophic right kidney with a small nonobstructive calculus of the midportion. Left kidney is normal. No ureteral calculi or hydronephrosis. Bladder is unremarkable. Stomach/Bowel: Stomach is within normal limits. Appendix appears normal. No evidence of bowel wall thickening, distention, or inflammatory changes. Descending and sigmoid diverticulosis. Vascular/Lymphatic: Aortic atherosclerosis. 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 No enlarged abdominal or pelvic lymph nodes. Reproductive: Status post prostatectomy. Other: Small fat containing bilateral inguinal hernias.  No ascites. Musculoskeletal: No acute osseous  findings. CT THORACIC AND LUMBAR SPINE FINDINGS Alignment: Gentle dextroscoliosis of the thoracic spine, apex T6, with otherwise normal thoracic kyphosis. Normal lumbar lordosis. Vertebral bodies: Osteopenia. Probable very subtle superior endplate deformity of L1 with no significant anterior height loss (series 6, image 34). Unchanged, chronic Schmorl deformity of the superior endplate of L3 (series 6, image 41). No fracture or dislocation. Disc spaces: Mild multilevel disc space height loss and osteophytosis throughout the thoracic and lumbar spine, with bridging osteophytosis of the mid to lower thoracic spine in keeping with DISH. Paraspinous soft tissues: Unremarkable. IMPRESSION: 1. No CT evidence of acute traumatic injury to the chest, abdomen, or pelvis. 2. Probable very subtle superior endplate deformity of L1 with no significant anterior height loss. Correlate for acute point tenderness. 3. Osteopenia. 4. Coronary artery disease. 5. Dense aortic valve calcifications. Correlate for echocardiographic evidence of aortic valve dysfunction. 6. Cirrhosis and splenomegaly. 7. Severely atrophic right kidney with a small nonobstructive calculus of the midportion. 8. Descending and sigmoid diverticulosis without evidence of acute diverticulitis. 9. Status post prostatectomy. No evidence of lymphadenopathy or metastatic disease in the abdomen or pelvis. 10. Aortic atherosclerosis. 11. Emphysema. Aortic Atherosclerosis (ICD10-I70.0) and Emphysema (ICD10-J43.9). Electronically Signed   By: Delanna Ahmadi M.D.   On: 08/10/2022 19:16   CT T-SPINE NO CHARGE  Result Date: 08/10/2022 CLINICAL DATA:  Fall, syncope, hit head, complains of thoracic back pain to palpation, history of prostate cancer * Tracking Code: BO * EXAM: CT CHEST, ABDOMEN, AND PELVIS WITH CONTRAST CT THORACIC AND LUMBAR SPINE WITH CONTRAST TECHNIQUE: Multidetector CT imaging of the chest, abdomen and pelvis was performed following the standard protocol  during bolus administration of intravenous contrast. Multidetector CT imaging of the thoracic and lumbar spine was performed following the standard protocol during bolus administration of intravenous contrast. RADIATION DOSE REDUCTION: This exam was performed according to the departmental dose-optimization program which includes automated exposure control, adjustment of the mA and/or kV according to patient size and/or use of iterative reconstruction technique. CONTRAST:  24m OMNIPAQUE IOHEXOL 350 MG/ML SOLN COMPARISON:  CT abdomen pelvis, 10/18/2020 FINDINGS: CT CHEST FINDINGS Cardiovascular: Aortic atherosclerosis. Dense aortic valve calcifications. Normal heart size. Three-vessel coronary artery calcifications. Very dense mitral annulus calcifications. No pericardial effusion. Mediastinum/Nodes: No enlarged mediastinal, hilar, or axillary lymph nodes. Small hiatal hernia. Thyroid gland, trachea, and esophagus demonstrate no significant findings. Lungs/Pleura: Mild paraseptal emphysema. Diffuse bilateral bronchial wall thickening. Dependent bibasilar scarring and or atelectasis. No pleural effusion or pneumothorax. Musculoskeletal: No chest wall mass or suspicious osseous lesions identified. CT ABDOMEN PELVIS FINDINGS Hepatobiliary: No solid liver abnormality is seen. Small benign left lobe liver cysts, for which no further follow-up or characterization is required. Coarse nodular cirrhotic morphology of the liver. Status post cholecystectomy. No biliary ductal dilatation. Pancreas: Unremarkable. No pancreatic ductal dilatation or surrounding inflammatory changes. Spleen: Splenomegaly, maximum coronal span 15.0 cm. Adrenals/Urinary Tract: Adrenal glands are unremarkable. Severely atrophic right kidney with a small nonobstructive calculus of the  midportion. Left kidney is normal. No ureteral calculi or hydronephrosis. Bladder is unremarkable. Stomach/Bowel: Stomach is within normal limits. Appendix appears normal.  No evidence of bowel wall thickening, distention, or inflammatory changes. Descending and sigmoid diverticulosis. Vascular/Lymphatic: Aortic atherosclerosis. 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 No enlarged abdominal or pelvic lymph nodes. Reproductive: Status post prostatectomy. Other: Small fat containing bilateral inguinal hernias.  No ascites. Musculoskeletal: No acute osseous findings. CT THORACIC AND LUMBAR SPINE FINDINGS Alignment: Gentle dextroscoliosis of the thoracic spine, apex T6, with otherwise normal thoracic kyphosis. Normal lumbar lordosis. Vertebral bodies: Osteopenia. Probable very subtle superior endplate deformity of L1 with no significant anterior height loss (series 6, image 34). Unchanged, chronic Schmorl deformity of the superior endplate of L3 (series 6, image 41). No fracture or dislocation. Disc spaces: Mild multilevel disc space height loss and osteophytosis throughout the thoracic and lumbar spine, with bridging osteophytosis of the mid to lower thoracic spine in keeping with DISH. Paraspinous soft tissues: Unremarkable. IMPRESSION: 1. No CT evidence of acute traumatic injury to the chest, abdomen, or pelvis. 2. Probable very subtle superior endplate deformity of L1 with no significant anterior height loss. Correlate for acute point tenderness. 3. Osteopenia. 4. Coronary artery disease. 5. Dense aortic valve calcifications. Correlate for echocardiographic evidence of aortic valve dysfunction. 6. Cirrhosis and splenomegaly. 7. Severely atrophic right kidney with a small nonobstructive calculus of the midportion. 8. Descending and sigmoid diverticulosis without evidence of acute diverticulitis. 9. Status post prostatectomy. No evidence of lymphadenopathy or metastatic disease in the abdomen or pelvis. 10. Aortic atherosclerosis. 11. Emphysema. Aortic Atherosclerosis (ICD10-I70.0) and Emphysema (ICD10-J43.9). Electronically Signed   By: Delanna Ahmadi M.D.   On: 08/10/2022 19:16   DG Pelvis Portable  Result Date: 08/10/2022 CLINICAL DATA:  Fall, right-sided hip pain EXAM: PORTABLE PELVIS 1-2 VIEWS COMPARISON:  None Available. FINDINGS: Osteopenia. There is no evidence of displaced pelvic fracture or diastasis. No pelvic bone lesions are seen. IMPRESSION: Osteopenia. No displaced fracture of the pelvis or bilateral proximal femurs seen in single frontal view. Please note that plain radiographs are significantly insensitive for hip and pelvic fracture. Recommend CT or MRI to more sensitively evaluate if there is high clinical suspicion for fracture. Electronically Signed   By: Delanna Ahmadi M.D.   On: 08/10/2022 17:11   DG Chest Portable 1 View  Result Date: 08/10/2022 CLINICAL DATA:  Back and right-sided chest pain following recent fall, initial encounter EXAM: PORTABLE CHEST 1 VIEW COMPARISON:  11/10/2020 FINDINGS: The heart size and mediastinal contours are within normal limits. Both lungs are clear. Bony structures show a vague lucency in the superior aspect of the scapula. An undisplaced fracture cannot be totally excluded given the patient's clinical history. IMPRESSION: No focal infiltrate noted. Changes suspicious for right scapular fracture. Correlate to point tenderness. Cross-sectional imaging may be helpful as clinically indicated. Electronically Signed   By: Inez Catalina M.D.   On: 08/10/2022 17:10    EKG: I independently viewed the EKG done and my findings are as followed: Sinus rhythm rate of 80.  Nonspecific ST changes.  QTc 505.  RBBB.  Assessment/Plan Present on Admission:  Syncope and collapse  Principal Problem:   Syncope and collapse  Syncope and collapse, unclear etiology History of chronic dizziness CT head no acute intracranial abnormalities Possible hypovolemia versus others Not hypotensive on arrival Obtain orthostatic vital signs Obtain 2D echo PT/OT evaluation Fall precautions Continue telemetry  monitoring Hold off SGLT2 inhibitor for  now to avoid hypovolemia.  Elevated troponin, suspect demand ischemia in the setting of syncope First set of high-sensitivity troponin 27 No evidence of acute ischemia on 12 lead EKG. Trend troponins Follow 2D echo  Prolonged Qtc Admission twelve-lead EKG showing QTc 505. Optimize magnesium and potassium levels Goal magnesium greater than 2.0, goal potassium greater than 4.0.  Paroxysmal A-fib, previously on Xarelto  No longer on DOAC after history of GI bleed  Not on rate control agents, rate is controlled. Continue telemetry monitoring  Elevated liver chemistries AST and T. bili elevated Avoid hepatotoxic agents, repeat CMaP in the morning  Hypothyroidism Resume home regimen.  BPH Resume home Flomax Monitor UO  CKD 3B Creatinine at baseline 1.46 with GFR 47. Avoid nephrotoxic agents, dehydration and hypotension. Gentle IV fluid hydration LR 50 cc/h x 1 day. Monitor urine output with strict I's and O's.  Hyperlipidemia Resume home meds ADL  Chronic diastolic CHF Euvolemic on exam On SGLT2 inhibitor-will get a UA Strict I's and O's and daily weight   DVT prophylaxis: Subcu Lovenox daily  Code Status: Full code  Family Communication: None at bedside  Disposition Plan: Admitted to telemetry cardiac unit  Consults called: None.  Admission status: Observation status.   Status is: Observation    Kayleen Memos MD [facility] Pager [phone]  If 7PM-7AM, please contact night-coverage [email or web address] Password [facility]  08/10/2022, 7:47 PM

## 2022-08-10 NOTE — ED Notes (Signed)
C-collar removed per Dr. Laverta Baltimore

## 2022-08-10 NOTE — ED Triage Notes (Signed)
Pt BIB [facility] for a witnessed fall with syncope.  Hit head on floor, hematoma to back of head.  No thinners for last 6 months. Complains of center thoracic pain on palpation.  PMS normal. A&O x4. 12-lead unremarkable.   EMs vitals. BP: 146/78, 98% RA, HR 74, CBG 102  18G L. AC

## 2022-08-10 NOTE — ED Provider Notes (Signed)
Emergency Department Provider Note   I have reviewed the triage vital signs and the nursing notes.   HISTORY  Chief Complaint No chief complaint on file.   HPI [name] is a 89 y.o. male with past history of TIA, hypertension, hyperlipidemia presents to the emergency department after 2 syncope episodes in the past 24 hours.  He states yesterday he was walking up the stairs, using a cane which is his baseline.  He felt like he became suddenly weak on the left causing him to fall to the ground.  He is unsure regarding complete loss of consciousness and was helped to his feet by his wife.  Denies any chest pain, palpitations, shortness of breath, headache.  He had a second such episode today while standing in the kitchen.  No clear provoking factor.  He felt suddenly weak, again on the left, and fell to the ground.  This event was apparently witnessed according to EMS.  He apparently did lose consciousness and sustained a hematoma to the back of his head.  He is complaining of pain in his lower and mid back as well which is new for him.  No unilateral weakness, numbness, dizziness.  Pain is only present with movement.  He did take a Vicodin prior to arrival.   Past Medical History:  Diagnosis Date   Anginal pain ([place])    Anxiety    " OCCASIONAL"   Arthritis    Asthma due to seasonal allergies    uses inhalers prn   Carotid artery occlusion    Left   Complication of anesthesia    " had tremors after prostate surgery   Coronary artery disease    Diverticulitis    recurrent   Family history of anesthesia complication    MOTHER    GERD (gastroesophageal reflux disease)    H/O hiatal hernia    Hearing aid worn    Hyperlipemia    Hypertension    Kidney stone    lithotripsy 7782 w complications, req stents, Dr Rosana Hoes   Prostate CA [facility])    Shortness of breath    Thyroid disease    TIA (transient ischemic attack)    Tinnitus     Review of Systems  Constitutional: No  fever/chills Cardiovascular: Denies chest pain. Positive syncope x 2.  Respiratory: Denies shortness of breath. Gastrointestinal: No abdominal pain.  No nausea, no vomiting.  No diarrhea.  No constipation. Genitourinary: Negative for dysuria. Musculoskeletal: Positive for back pain. Skin: Negative for rash. Neurological: Negative for headaches, focal weakness or numbness.  ____________________________________________   PHYSICAL EXAM:  VITAL SIGNS: ED Triage Vitals  Enc Vitals Group     BP 08/10/22 1640 (!) 163/88     Pulse Rate 08/10/22 1640 79     Resp 08/10/22 1640 16     Temp 08/10/22 1640 97.6 F (36.4 C)     Temp Source 08/10/22 1640 Oral     SpO2 08/10/22 1640 100 %     Weight 08/10/22 1633 202 lb (91.6 kg)     Height 08/10/22 1633 '5\' 6"'$  (1.676 m)   Constitutional: Alert and oriented. Well appearing and in no acute distress. Eyes: Conjunctivae are normal. PERRL. EOMI. No nystagmus.  Head: Occipital hematoma with bruising but no laceration. Nose: No congestion/rhinnorhea. Mouth/Throat: Mucous membranes are moist.  Oropharynx non-erythematous. Neck: No stridor. C collar in place upon arrival.  Cardiovascular: Normal rate, regular rhythm. Good peripheral circulation. Grossly normal heart sounds.   Respiratory: Normal  respiratory effort.  No retractions. Lungs CTAB. Gastrointestinal: Soft and nontender. No distention.  Musculoskeletal: No lower extremity tenderness nor edema. No gross deformities of extremities. Neurologic:  Normal speech and language. No gross focal neurologic deficits are appreciated.  Skin:  Skin is warm, dry and intact. No rash noted.  ____________________________________________   LABS (all labs ordered are listed, but only abnormal results are displayed)  Labs Reviewed  COMPREHENSIVE METABOLIC PANEL - Abnormal; Notable for the following components:      Result Value   Creatinine, Ser 1.46 (*)    Calcium 8.8 (*)    AST 43 (*)    Total  Bilirubin 2.2 (*)    GFR, Estimated 47 (*)    All other components within normal limits  CBC WITH DIFFERENTIAL/PLATELET - Abnormal; Notable for the following components:   RBC 3.84 (*)    MCV 102.3 (*)    MCH 35.4 (*)    Platelets 95 (*)    Abs Immature Granulocytes 0.16 (*)    All other components within normal limits  TROPONIN I (HIGH SENSITIVITY) - Abnormal; Notable for the following components:   Troponin I (High Sensitivity) 27 (*)    All other components within normal limits  RESP PANEL BY RT-PCR (RSV, FLU A&B, COVID)  [id number]  URINALYSIS, ROUTINE W REFLEX MICROSCOPIC  TROPONIN I (HIGH SENSITIVITY)   ____________________________________________  EKG   EKG Interpretation  Date/Time:  Monday August 10 2022 16:37:17 EST Ventricular Rate:  80 PR Interval:  168 QRS Duration: 134 QT Interval:  437 QTC Calculation: 505 R Axis:   62 Text Interpretation: Sinus rhythm Right bundle branch block Confirmed by Nanda Quinton [phone]) on 08/10/2022 5:18:57 PM        ____________________________________________  RADIOLOGY  CT Head Wo Contrast  Result Date: 08/10/2022 CLINICAL DATA:  Fall, hit head on floor, hematoma to back of head EXAM: CT HEAD WITHOUT CONTRAST CT CERVICAL SPINE WITHOUT CONTRAST TECHNIQUE: Multidetector CT imaging of the head and cervical spine was performed following the standard protocol without intravenous contrast. Multiplanar CT image reconstructions of the cervical spine were also generated. RADIATION DOSE REDUCTION: This exam was performed according to the departmental dose-optimization program which includes automated exposure control, adjustment of the mA and/or kV according to patient size and/or use of iterative reconstruction technique. COMPARISON:  None Available. FINDINGS: CT HEAD FINDINGS Brain: No evidence of acute infarction, hemorrhage, hydrocephalus, extra-axial collection or mass lesion/mass effect. Periventricular and deep white matter hypodensity.  Vascular: No hyperdense vessel or unexpected calcification. Skull: Normal. Negative for fracture or focal lesion. Sinuses/Orbits: No acute finding. Other: Soft tissue contusion of the scalp vertex (series 4, image 8). CT CERVICAL SPINE FINDINGS Alignment: Normal. Skull base and vertebrae: No acute fracture. No primary bone lesion or focal pathologic process. Soft tissues and spinal canal: No prevertebral fluid or swelling. No visible canal hematoma. Disc levels: Focally moderate disc space height loss and osteophytosis of C5-C7 with otherwise preserved disc spaces. Upper chest: Negative. Other: None. IMPRESSION: 1. No acute intracranial pathology. Small-vessel white matter disease. 2. Soft tissue contusion of the scalp vertex. 3. No fracture or static subluxation of the cervical spine. Electronically Signed   By: Delanna Ahmadi M.D.   On: 08/10/2022 19:20   CT Cervical Spine Wo Contrast  Result Date: 08/10/2022 CLINICAL DATA:  Fall, hit head on floor, hematoma to back of head EXAM: CT HEAD WITHOUT CONTRAST CT CERVICAL SPINE WITHOUT CONTRAST TECHNIQUE: Multidetector CT imaging of the head and cervical spine was  performed following the standard protocol without intravenous contrast. Multiplanar CT image reconstructions of the cervical spine were also generated. RADIATION DOSE REDUCTION: This exam was performed according to the departmental dose-optimization program which includes automated exposure control, adjustment of the mA and/or kV according to patient size and/or use of iterative reconstruction technique. COMPARISON:  None Available. FINDINGS: CT HEAD FINDINGS Brain: No evidence of acute infarction, hemorrhage, hydrocephalus, extra-axial collection or mass lesion/mass effect. Periventricular and deep white matter hypodensity. Vascular: No hyperdense vessel or unexpected calcification. Skull: Normal. Negative for fracture or focal lesion. Sinuses/Orbits: No acute finding. Other: Soft tissue contusion of the  scalp vertex (series 4, image 8). CT CERVICAL SPINE FINDINGS Alignment: Normal. Skull base and vertebrae: No acute fracture. No primary bone lesion or focal pathologic process. Soft tissues and spinal canal: No prevertebral fluid or swelling. No visible canal hematoma. Disc levels: Focally moderate disc space height loss and osteophytosis of C5-C7 with otherwise preserved disc spaces. Upper chest: Negative. Other: None. IMPRESSION: 1. No acute intracranial pathology. Small-vessel white matter disease. 2. Soft tissue contusion of the scalp vertex. 3. No fracture or static subluxation of the cervical spine. Electronically Signed   By: Delanna Ahmadi M.D.   On: 08/10/2022 19:20   CT CHEST ABDOMEN PELVIS W CONTRAST  Result Date: 08/10/2022 CLINICAL DATA:  Fall, syncope, hit head, complains of thoracic back pain to palpation, history of prostate cancer * Tracking Code: BO * EXAM: CT CHEST, ABDOMEN, AND PELVIS WITH CONTRAST CT THORACIC AND LUMBAR SPINE WITH CONTRAST TECHNIQUE: Multidetector CT imaging of the chest, abdomen and pelvis was performed following the standard protocol during bolus administration of intravenous contrast. Multidetector CT imaging of the thoracic and lumbar spine was performed following the standard protocol during bolus administration of intravenous contrast. RADIATION DOSE REDUCTION: This exam was performed according to the departmental dose-optimization program which includes automated exposure control, adjustment of the mA and/or kV according to patient size and/or use of iterative reconstruction technique. CONTRAST:  19m OMNIPAQUE IOHEXOL 350 MG/ML SOLN COMPARISON:  CT abdomen pelvis, 10/18/2020 FINDINGS: CT CHEST FINDINGS Cardiovascular: Aortic atherosclerosis. Dense aortic valve calcifications. Normal heart size. Three-vessel coronary artery calcifications. Very dense mitral annulus calcifications. No pericardial effusion. Mediastinum/Nodes: No enlarged mediastinal, hilar, or axillary  lymph nodes. Small hiatal hernia. Thyroid gland, trachea, and esophagus demonstrate no significant findings. Lungs/Pleura: Mild paraseptal emphysema. Diffuse bilateral bronchial wall thickening. Dependent bibasilar scarring and or atelectasis. No pleural effusion or pneumothorax. Musculoskeletal: No chest wall mass or suspicious osseous lesions identified. CT ABDOMEN PELVIS FINDINGS Hepatobiliary: No solid liver abnormality is seen. Small benign left lobe liver cysts, for which no further follow-up or characterization is required. Coarse nodular cirrhotic morphology of the liver. Status post cholecystectomy. No biliary ductal dilatation. Pancreas: Unremarkable. No pancreatic ductal dilatation or surrounding inflammatory changes. Spleen: Splenomegaly, maximum coronal span 15.0 cm. Adrenals/Urinary Tract: Adrenal glands are unremarkable. Severely atrophic right kidney with a small nonobstructive calculus of the midportion. Left kidney is normal. No ureteral calculi or hydronephrosis. Bladder is unremarkable. Stomach/Bowel: Stomach is within normal limits. Appendix appears normal. No evidence of bowel wall thickening, distention, or inflammatory changes. Descending and sigmoid diverticulosis. Vascular/Lymphatic: Aortic atherosclerosis. 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 No enlarged abdominal or pelvic lymph nodes. Reproductive: Status post prostatectomy. Other: Small fat containing bilateral inguinal hernias.  No ascites. Musculoskeletal: No acute osseous findings. CT THORACIC AND LUMBAR SPINE FINDINGS Alignment: Gentle dextroscoliosis of the thoracic spine, apex T6, with otherwise normal  thoracic kyphosis. Normal lumbar lordosis. Vertebral bodies: Osteopenia. Probable very subtle superior endplate deformity of L1 with no significant anterior height loss (series 6, image 34). Unchanged, chronic Schmorl deformity of the superior endplate of L3 (series 6, image 41). No  fracture or dislocation. Disc spaces: Mild multilevel disc space height loss and osteophytosis throughout the thoracic and lumbar spine, with bridging osteophytosis of the mid to lower thoracic spine in keeping with DISH. Paraspinous soft tissues: Unremarkable. IMPRESSION: 1. No CT evidence of acute traumatic injury to the chest, abdomen, or pelvis. 2. Probable very subtle superior endplate deformity of L1 with no significant anterior height loss. Correlate for acute point tenderness. 3. Osteopenia. 4. Coronary artery disease. 5. Dense aortic valve calcifications. Correlate for echocardiographic evidence of aortic valve dysfunction. 6. Cirrhosis and splenomegaly. 7. Severely atrophic right kidney with a small nonobstructive calculus of the midportion. 8. Descending and sigmoid diverticulosis without evidence of acute diverticulitis. 9. Status post prostatectomy. No evidence of lymphadenopathy or metastatic disease in the abdomen or pelvis. 10. Aortic atherosclerosis. 11. Emphysema. Aortic Atherosclerosis (ICD10-I70.0) and Emphysema (ICD10-J43.9). Electronically Signed   By: Delanna Ahmadi M.D.   On: 08/10/2022 19:16   CT L-SPINE NO CHARGE  Result Date: 08/10/2022 CLINICAL DATA:  Fall, syncope, hit head, complains of thoracic back pain to palpation, history of prostate cancer * Tracking Code: BO * EXAM: CT CHEST, ABDOMEN, AND PELVIS WITH CONTRAST CT THORACIC AND LUMBAR SPINE WITH CONTRAST TECHNIQUE: Multidetector CT imaging of the chest, abdomen and pelvis was performed following the standard protocol during bolus administration of intravenous contrast. Multidetector CT imaging of the thoracic and lumbar spine was performed following the standard protocol during bolus administration of intravenous contrast. RADIATION DOSE REDUCTION: This exam was performed according to the departmental dose-optimization program which includes automated exposure control, adjustment of the mA and/or kV according to patient size  and/or use of iterative reconstruction technique. CONTRAST:  25m OMNIPAQUE IOHEXOL 350 MG/ML SOLN COMPARISON:  CT abdomen pelvis, 10/18/2020 FINDINGS: CT CHEST FINDINGS Cardiovascular: Aortic atherosclerosis. Dense aortic valve calcifications. Normal heart size. Three-vessel coronary artery calcifications. Very dense mitral annulus calcifications. No pericardial effusion. Mediastinum/Nodes: No enlarged mediastinal, hilar, or axillary lymph nodes. Small hiatal hernia. Thyroid gland, trachea, and esophagus demonstrate no significant findings. Lungs/Pleura: Mild paraseptal emphysema. Diffuse bilateral bronchial wall thickening. Dependent bibasilar scarring and or atelectasis. No pleural effusion or pneumothorax. Musculoskeletal: No chest wall mass or suspicious osseous lesions identified. CT ABDOMEN PELVIS FINDINGS Hepatobiliary: No solid liver abnormality is seen. Small benign left lobe liver cysts, for which no further follow-up or characterization is required. Coarse nodular cirrhotic morphology of the liver. Status post cholecystectomy. No biliary ductal dilatation. Pancreas: Unremarkable. No pancreatic ductal dilatation or surrounding inflammatory changes. Spleen: Splenomegaly, maximum coronal span 15.0 cm. Adrenals/Urinary Tract: Adrenal glands are unremarkable. Severely atrophic right kidney with a small nonobstructive calculus of the midportion. Left kidney is normal. No ureteral calculi or hydronephrosis. Bladder is unremarkable. Stomach/Bowel: Stomach is within normal limits. Appendix appears normal. No evidence of bowel wall thickening, distention, or inflammatory changes. Descending and sigmoid diverticulosis. Vascular/Lymphatic: Aortic atherosclerosis. 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 No enlarged abdominal or pelvic lymph nodes. Reproductive: Status post prostatectomy. Other: Small fat containing bilateral inguinal hernias.  No ascites. Musculoskeletal:  No acute osseous findings. CT THORACIC AND LUMBAR SPINE FINDINGS Alignment: Gentle dextroscoliosis of the thoracic spine, apex T6, with otherwise normal thoracic kyphosis. Normal lumbar lordosis. Vertebral bodies: Osteopenia. Probable very subtle superior  endplate deformity of L1 with no significant anterior height loss (series 6, image 34). Unchanged, chronic Schmorl deformity of the superior endplate of L3 (series 6, image 41). No fracture or dislocation. Disc spaces: Mild multilevel disc space height loss and osteophytosis throughout the thoracic and lumbar spine, with bridging osteophytosis of the mid to lower thoracic spine in keeping with DISH. Paraspinous soft tissues: Unremarkable. IMPRESSION: 1. No CT evidence of acute traumatic injury to the chest, abdomen, or pelvis. 2. Probable very subtle superior endplate deformity of L1 with no significant anterior height loss. Correlate for acute point tenderness. 3. Osteopenia. 4. Coronary artery disease. 5. Dense aortic valve calcifications. Correlate for echocardiographic evidence of aortic valve dysfunction. 6. Cirrhosis and splenomegaly. 7. Severely atrophic right kidney with a small nonobstructive calculus of the midportion. 8. Descending and sigmoid diverticulosis without evidence of acute diverticulitis. 9. Status post prostatectomy. No evidence of lymphadenopathy or metastatic disease in the abdomen or pelvis. 10. Aortic atherosclerosis. 11. Emphysema. Aortic Atherosclerosis (ICD10-I70.0) and Emphysema (ICD10-J43.9). Electronically Signed   By: Delanna Ahmadi M.D.   On: 08/10/2022 19:16   CT T-SPINE NO CHARGE  Result Date: 08/10/2022 CLINICAL DATA:  Fall, syncope, hit head, complains of thoracic back pain to palpation, history of prostate cancer * Tracking Code: BO * EXAM: CT CHEST, ABDOMEN, AND PELVIS WITH CONTRAST CT THORACIC AND LUMBAR SPINE WITH CONTRAST TECHNIQUE: Multidetector CT imaging of the chest, abdomen and pelvis was performed following the  standard protocol during bolus administration of intravenous contrast. Multidetector CT imaging of the thoracic and lumbar spine was performed following the standard protocol during bolus administration of intravenous contrast. RADIATION DOSE REDUCTION: This exam was performed according to the departmental dose-optimization program which includes automated exposure control, adjustment of the mA and/or kV according to patient size and/or use of iterative reconstruction technique. CONTRAST:  53m OMNIPAQUE IOHEXOL 350 MG/ML SOLN COMPARISON:  CT abdomen pelvis, 10/18/2020 FINDINGS: CT CHEST FINDINGS Cardiovascular: Aortic atherosclerosis. Dense aortic valve calcifications. Normal heart size. Three-vessel coronary artery calcifications. Very dense mitral annulus calcifications. No pericardial effusion. Mediastinum/Nodes: No enlarged mediastinal, hilar, or axillary lymph nodes. Small hiatal hernia. Thyroid gland, trachea, and esophagus demonstrate no significant findings. Lungs/Pleura: Mild paraseptal emphysema. Diffuse bilateral bronchial wall thickening. Dependent bibasilar scarring and or atelectasis. No pleural effusion or pneumothorax. Musculoskeletal: No chest wall mass or suspicious osseous lesions identified. CT ABDOMEN PELVIS FINDINGS Hepatobiliary: No solid liver abnormality is seen. Small benign left lobe liver cysts, for which no further follow-up or characterization is required. Coarse nodular cirrhotic morphology of the liver. Status post cholecystectomy. No biliary ductal dilatation. Pancreas: Unremarkable. No pancreatic ductal dilatation or surrounding inflammatory changes. Spleen: Splenomegaly, maximum coronal span 15.0 cm. Adrenals/Urinary Tract: Adrenal glands are unremarkable. Severely atrophic right kidney with a small nonobstructive calculus of the midportion. Left kidney is normal. No ureteral calculi or hydronephrosis. Bladder is unremarkable. Stomach/Bowel: Stomach is within normal limits.  Appendix appears normal. No evidence of bowel wall thickening, distention, or inflammatory changes. Descending and sigmoid diverticulosis. Vascular/Lymphatic: Aortic atherosclerosis. 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 No enlarged abdominal or pelvic lymph nodes. Reproductive: Status post prostatectomy. Other: Small fat containing bilateral inguinal hernias.  No ascites. Musculoskeletal: No acute osseous findings. CT THORACIC AND LUMBAR SPINE FINDINGS Alignment: Gentle dextroscoliosis of the thoracic spine, apex T6, with otherwise normal thoracic kyphosis. Normal lumbar lordosis. Vertebral bodies: Osteopenia. Probable very subtle superior endplate deformity of L1 with no significant anterior height loss (series 6, image  34). Unchanged, chronic Schmorl deformity of the superior endplate of L3 (series 6, image 41). No fracture or dislocation. Disc spaces: Mild multilevel disc space height loss and osteophytosis throughout the thoracic and lumbar spine, with bridging osteophytosis of the mid to lower thoracic spine in keeping with DISH. Paraspinous soft tissues: Unremarkable. IMPRESSION: 1. No CT evidence of acute traumatic injury to the chest, abdomen, or pelvis. 2. Probable very subtle superior endplate deformity of L1 with no significant anterior height loss. Correlate for acute point tenderness. 3. Osteopenia. 4. Coronary artery disease. 5. Dense aortic valve calcifications. Correlate for echocardiographic evidence of aortic valve dysfunction. 6. Cirrhosis and splenomegaly. 7. Severely atrophic right kidney with a small nonobstructive calculus of the midportion. 8. Descending and sigmoid diverticulosis without evidence of acute diverticulitis. 9. Status post prostatectomy. No evidence of lymphadenopathy or metastatic disease in the abdomen or pelvis. 10. Aortic atherosclerosis. 11. Emphysema. Aortic Atherosclerosis (ICD10-I70.0) and Emphysema (ICD10-J43.9).  Electronically Signed   By: Delanna Ahmadi M.D.   On: 08/10/2022 19:16   DG Pelvis Portable  Result Date: 08/10/2022 CLINICAL DATA:  Fall, right-sided hip pain EXAM: PORTABLE PELVIS 1-2 VIEWS COMPARISON:  None Available. FINDINGS: Osteopenia. There is no evidence of displaced pelvic fracture or diastasis. No pelvic bone lesions are seen. IMPRESSION: Osteopenia. No displaced fracture of the pelvis or bilateral proximal femurs seen in single frontal view. Please note that plain radiographs are significantly insensitive for hip and pelvic fracture. Recommend CT or MRI to more sensitively evaluate if there is high clinical suspicion for fracture. Electronically Signed   By: Delanna Ahmadi M.D.   On: 08/10/2022 17:11   DG Chest Portable 1 View  Result Date: 08/10/2022 CLINICAL DATA:  Back and right-sided chest pain following recent fall, initial encounter EXAM: PORTABLE CHEST 1 VIEW COMPARISON:  11/10/2020 FINDINGS: The heart size and mediastinal contours are within normal limits. Both lungs are clear. Bony structures show a vague lucency in the superior aspect of the scapula. An undisplaced fracture cannot be totally excluded given the patient's clinical history. IMPRESSION: No focal infiltrate noted. Changes suspicious for right scapular fracture. Correlate to point tenderness. Cross-sectional imaging may be helpful as clinically indicated. Electronically Signed   By: Inez Catalina M.D.   On: 08/10/2022 17:10    ____________________________________________   PROCEDURES  Procedure(s) performed:   Procedures  None  ____________________________________________   INITIAL IMPRESSION / ASSESSMENT AND PLAN / ED COURSE  Pertinent labs & imaging results that were available during my care of the patient were reviewed by me and considered in my medical decision making (see chart for details).   This patient is Presenting for Evaluation of syncope, which does require a range of treatment options, and is a  complaint that involves a high risk of morbidity and mortality.  The Differential Diagnoses include cardiogenic syncope, CVA, ACS, PE, dehydration, balance issues, etc.  Critical Interventions-    Medications  fentaNYL (SUBLIMAZE) injection 100 mcg (has no administration in time range)  iohexol (OMNIPAQUE) 350 MG/ML injection 75 mL (75 mLs Intravenous Contrast Given 08/10/22 1850)    Reassessment after intervention: Pain improved if still but spasm pain with movement.    I did obtain Additional Historical Information from EMS.    Clinical Laboratory Tests Ordered, included COVID and flu negative.  Creatinine 1.46.  Mild elevated troponin at 27.  Continue to follow.  Radiologic Tests Ordered, included CT pan scan and x-rays. I independently interpreted the images and agree with radiology interpretation.   Cardiac  Monitor Tracing which shows NSR.    Social Determinants of Health Risk patient is not an active smoker.   Consult complete with Hospitalist. Plan for syncope admit.   Medical Decision Making: Summary:  Patient presents to the emergency department for evaluation after 2 syncopal events in the past 24 hours.  He has a hematoma to the occipital scalp without laceration and tenderness in his lower back.  Plan for trauma rule out as well as workup for cardiogenic syncope.  Reevaluation with update and discussion with patient and wife at bedside. Difficult to assess point tenderness at L1 as he has spasm pain with even minor movement. No other traumatic findings. Plan for admit for syncope evaluation.    Patient's presentation is most consistent with acute presentation with potential threat to life or bodily function.   Disposition: admit  ____________________________________________  FINAL CLINICAL IMPRESSION(S) / ED DIAGNOSES  Final diagnoses:  Syncope, unspecified syncope type  Injury of head, initial encounter    Note:  This document was prepared using Dragon voice  recognition software and may include unintentional dictation errors.  Nanda Quinton, MD, [facility]    Darcey Cardy, [facility], MD 08/10/22 2006

## 2022-08-11 ENCOUNTER — Inpatient Hospital Stay (HOSPITAL_COMMUNITY): Payer: Medicare Other

## 2022-08-11 ENCOUNTER — Observation Stay (HOSPITAL_COMMUNITY): Payer: Medicare Other

## 2022-08-11 ENCOUNTER — Other Ambulatory Visit (HOSPITAL_COMMUNITY): Payer: Medicare Other

## 2022-08-11 ENCOUNTER — Inpatient Hospital Stay (HOSPITAL_COMMUNITY)
Admission: EM | Disposition: A | Discharge status: Skilled Nursing Facility | Payer: Self-pay | Source: Home / Self Care | Attending: Internal Medicine

## 2022-08-11 DIAGNOSIS — G4733 Obstructive sleep apnea (adult) (pediatric): Secondary | ICD-10-CM | POA: Diagnosis present

## 2022-08-11 DIAGNOSIS — E785 Hyperlipidemia, unspecified: Secondary | ICD-10-CM | POA: Diagnosis present

## 2022-08-11 DIAGNOSIS — D539 Nutritional anemia, unspecified: Secondary | ICD-10-CM | POA: Diagnosis present

## 2022-08-11 DIAGNOSIS — I469 Cardiac arrest, cause unspecified: Secondary | ICD-10-CM

## 2022-08-11 DIAGNOSIS — I5032 Chronic diastolic (congestive) heart failure: Secondary | ICD-10-CM | POA: Diagnosis present

## 2022-08-11 DIAGNOSIS — I442 Atrioventricular block, complete: Principal | ICD-10-CM

## 2022-08-11 DIAGNOSIS — R55 Syncope and collapse: Secondary | ICD-10-CM | POA: Diagnosis present

## 2022-08-11 DIAGNOSIS — I080 Rheumatic disorders of both mitral and aortic valves: Secondary | ICD-10-CM | POA: Diagnosis present

## 2022-08-11 DIAGNOSIS — E1165 Type 2 diabetes mellitus with hyperglycemia: Secondary | ICD-10-CM | POA: Diagnosis present

## 2022-08-11 DIAGNOSIS — G9340 Encephalopathy, unspecified: Secondary | ICD-10-CM | POA: Diagnosis not present

## 2022-08-11 DIAGNOSIS — K7460 Unspecified cirrhosis of liver: Secondary | ICD-10-CM | POA: Diagnosis present

## 2022-08-11 DIAGNOSIS — E669 Obesity, unspecified: Secondary | ICD-10-CM | POA: Diagnosis present

## 2022-08-11 DIAGNOSIS — S0003XA Contusion of scalp, initial encounter: Secondary | ICD-10-CM | POA: Diagnosis present

## 2022-08-11 DIAGNOSIS — I4510 Unspecified right bundle-branch block: Secondary | ICD-10-CM | POA: Diagnosis not present

## 2022-08-11 DIAGNOSIS — I441 Atrioventricular block, second degree: Secondary | ICD-10-CM | POA: Diagnosis present

## 2022-08-11 DIAGNOSIS — Z1152 Encounter for screening for COVID-19: Secondary | ICD-10-CM | POA: Diagnosis not present

## 2022-08-11 DIAGNOSIS — I130 Hypertensive heart and chronic kidney disease with heart failure and stage 1 through stage 4 chronic kidney disease, or unspecified chronic kidney disease: Secondary | ICD-10-CM | POA: Diagnosis present

## 2022-08-11 DIAGNOSIS — D6959 Other secondary thrombocytopenia: Secondary | ICD-10-CM | POA: Diagnosis present

## 2022-08-11 DIAGNOSIS — S32018A Other fracture of first lumbar vertebra, initial encounter for closed fracture: Secondary | ICD-10-CM | POA: Diagnosis present

## 2022-08-11 DIAGNOSIS — I455 Other specified heart block: Secondary | ICD-10-CM

## 2022-08-11 DIAGNOSIS — M62830 Muscle spasm of back: Secondary | ICD-10-CM | POA: Diagnosis present

## 2022-08-11 DIAGNOSIS — I2489 Other forms of acute ischemic heart disease: Secondary | ICD-10-CM | POA: Diagnosis present

## 2022-08-11 DIAGNOSIS — N1832 Chronic kidney disease, stage 3b: Secondary | ICD-10-CM | POA: Diagnosis present

## 2022-08-11 DIAGNOSIS — H9193 Unspecified hearing loss, bilateral: Secondary | ICD-10-CM | POA: Diagnosis present

## 2022-08-11 DIAGNOSIS — W19XXXA Unspecified fall, initial encounter: Secondary | ICD-10-CM | POA: Diagnosis present

## 2022-08-11 DIAGNOSIS — E039 Hypothyroidism, unspecified: Secondary | ICD-10-CM | POA: Diagnosis present

## 2022-08-11 DIAGNOSIS — R4189 Other symptoms and signs involving cognitive functions and awareness: Secondary | ICD-10-CM | POA: Diagnosis present

## 2022-08-11 DIAGNOSIS — E1122 Type 2 diabetes mellitus with diabetic chronic kidney disease: Secondary | ICD-10-CM | POA: Diagnosis present

## 2022-08-11 DIAGNOSIS — I480 Paroxysmal atrial fibrillation: Secondary | ICD-10-CM | POA: Diagnosis present

## 2022-08-11 HISTORY — PX: TEMPORARY PACEMAKER: CATH118268

## 2022-08-11 LAB — URINALYSIS, ROUTINE W REFLEX MICROSCOPIC
Bacteria, UA: NONE SEEN
Bilirubin Urine: NEGATIVE
Glucose, UA: >=500 mg/dL — AB
Ketones, ur: 5 mg/dL — AB
Leukocytes,Ua: NEGATIVE
Nitrite: NEGATIVE
Protein, ur: NEGATIVE mg/dL
Specific Gravity, Urine: 1.03 (ref 1.005–1.030)
pH: 6 (ref 5.0–8.0)

## 2022-08-11 LAB — COMPREHENSIVE METABOLIC PANEL
ALT: 25 U/L (ref 0–44)
AST: 52 U/L — ABNORMAL HIGH (ref 15–41)
Albumin: 3.5 g/dL (ref 3.5–5.0)
Alkaline Phosphatase: 87 U/L (ref 38–126)
Anion gap: 9 (ref 5–15)
BUN: 13 mg/dL (ref 8–23)
CO2: 22 mmol/L (ref 22–32)
Calcium: 8.9 mg/dL (ref 8.9–10.3)
Chloride: 105 mmol/L (ref 98–111)
Creatinine, Ser: 1.36 mg/dL — ABNORMAL HIGH (ref 0.61–1.24)
GFR, Estimated: 50 mL/min — ABNORMAL LOW (ref >60)
Glucose, Bld: 137 mg/dL — ABNORMAL HIGH (ref 70–99)
Potassium: 4 mmol/L (ref 3.5–5.1)
Sodium: 136 mmol/L (ref 135–145)
Total Bilirubin: 4.1 mg/dL — ABNORMAL HIGH (ref 0.3–1.2)
Total Protein: 6.6 g/dL (ref 6.5–8.1)

## 2022-08-11 LAB — CBC
HCT: 40.1 % (ref 39.0–52.0)
Hemoglobin: 14 g/dL (ref 13.0–17.0)
MCH: 35.9 pg — ABNORMAL HIGH (ref 26.0–34.0)
MCHC: 34.9 g/dL (ref 30.0–36.0)
MCV: 102.8 fL — ABNORMAL HIGH (ref 80.0–100.0)
Platelets: 88 10*3/uL — ABNORMAL LOW (ref 150–400)
RBC: 3.9 MIL/uL — ABNORMAL LOW (ref 4.22–5.81)
RDW: 15.2 % (ref 11.5–15.5)
WBC: 6.7 10*3/uL (ref 4.0–10.5)
nRBC: 0 % (ref 0.0–0.2)

## 2022-08-11 LAB — PHOSPHORUS: Phosphorus: 3 mg/dL (ref 2.5–4.6)

## 2022-08-11 LAB — GLUCOSE, CAPILLARY: Glucose-Capillary: 124 mg/dL — ABNORMAL HIGH (ref 70–99)

## 2022-08-11 LAB — MRSA NEXT GEN BY PCR, NASAL: MRSA by PCR Next Gen: NOT DETECTED

## 2022-08-11 LAB — MAGNESIUM: Magnesium: 1.8 mg/dL (ref 1.7–2.4)

## 2022-08-11 SURGERY — TEMPORARY PACEMAKER
Anesthesia: LOCAL

## 2022-08-11 MED ORDER — CHLORHEXIDINE GLUCONATE CLOTH 2 % EX PADS
6.0000 | MEDICATED_PAD | Freq: Every day | CUTANEOUS | Status: DC
  Administered 2022-08-11 – 2022-08-15 (×6): 6 via TOPICAL

## 2022-08-11 MED ORDER — HEPARIN (PORCINE) IN NACL 1000-0.9 UT/500ML-% IV SOLN
INTRAVENOUS | Status: DC | PRN
  Administered 2022-08-11: 500 mL

## 2022-08-11 MED ORDER — PROCHLORPERAZINE EDISYLATE 10 MG/2ML IJ SOLN
5.0000 mg | Freq: Four times a day (QID) | INTRAMUSCULAR | Status: DC | PRN
  Administered 2022-08-11: 5 mg via INTRAVENOUS
  Filled 2022-08-11: qty 2

## 2022-08-11 MED ORDER — LIDOCAINE HCL (PF) 1 % IJ SOLN
INTRAMUSCULAR | Status: AC
  Filled 2022-08-11: qty 30

## 2022-08-11 MED ORDER — LIDOCAINE HCL (PF) 1 % IJ SOLN
INTRAMUSCULAR | Status: DC | PRN
  Administered 2022-08-11: 10 mL

## 2022-08-11 MED ORDER — EZETIMIBE 10 MG PO TABS: 10.0000 mg | ORAL_TABLET | Freq: Every day | ORAL | Status: DC

## 2022-08-11 MED ORDER — OXYCODONE HCL 5 MG PO TABS
5.0000 mg | ORAL_TABLET | ORAL | Status: DC | PRN
  Administered 2022-08-11 – 2022-08-20 (×25): 5 mg via ORAL
  Filled 2022-08-11 (×25): qty 1

## 2022-08-11 MED ORDER — POLYETHYLENE GLYCOL 3350 17 G PO PACK
17.0000 g | PACK | Freq: Every day | ORAL | Status: DC | PRN
  Administered 2022-08-13 – 2022-08-15 (×2): 17 g via ORAL
  Filled 2022-08-11 (×2): qty 1

## 2022-08-11 MED ORDER — ORAL CARE MOUTH RINSE: 15.0000 mL | OROMUCOSAL | Status: DC | PRN

## 2022-08-11 MED ORDER — HYDRALAZINE HCL 20 MG/ML IJ SOLN: 10.0000 mg | INTRAMUSCULAR | Status: DC | PRN

## 2022-08-11 MED ORDER — SODIUM CHLORIDE 0.9 % IV SOLN
INTRAVENOUS | Status: AC | PRN
  Administered 2022-08-11: 10 mL/h via INTRAVENOUS

## 2022-08-11 MED ORDER — HYDROMORPHONE HCL 1 MG/ML IJ SOLN
0.5000 mg | Freq: Once | INTRAMUSCULAR | Status: AC
  Administered 2022-08-11: 0.5 mg via INTRAVENOUS
  Filled 2022-08-11: qty 1

## 2022-08-11 MED ORDER — ACETAMINOPHEN 325 MG PO TABS
650.0000 mg | ORAL_TABLET | Freq: Four times a day (QID) | ORAL | Status: DC | PRN
  Administered 2022-08-11 – 2022-08-18 (×3): 650 mg via ORAL
  Filled 2022-08-11 (×4): qty 2

## 2022-08-11 MED ORDER — HEPARIN (PORCINE) IN NACL 1000-0.9 UT/500ML-% IV SOLN
INTRAVENOUS | Status: AC
  Filled 2022-08-11: qty 500

## 2022-08-11 MED ORDER — THYROID 60 MG PO TABS
120.0000 mg | ORAL_TABLET | Freq: Every day | ORAL | Status: DC
  Administered 2022-08-12 – 2022-08-20 (×9): 120 mg via ORAL
  Filled 2022-08-11 (×11): qty 2

## 2022-08-11 MED ORDER — ATROPINE SULFATE 1 MG/10ML IJ SOSY
PREFILLED_SYRINGE | INTRAMUSCULAR | Status: AC
  Filled 2022-08-11: qty 10

## 2022-08-11 MED ORDER — IPRATROPIUM BROMIDE 0.03 % NA SOLN: 2.0000 | Freq: Two times a day (BID) | NASAL | Status: DC

## 2022-08-11 SURGICAL SUPPLY — 9 items
CABLE ADAPT PACING TEMP 12FT (ADAPTER) IMPLANT
CATH S G BIP PACING (CATHETERS) IMPLANT
KIT MICROPUNCTURE NIT STIFF (SHEATH) IMPLANT
PACK CARDIAC CATHETERIZATION (CUSTOM PROCEDURE TRAY) IMPLANT
PROTECTION STATION PRESSURIZED (MISCELLANEOUS) ×1
SHEATH PINNACLE 6F 10CM (SHEATH) IMPLANT
SHEATH PROBE COVER 6X72 (BAG) IMPLANT
SLEEVE REPOSITIONING LENGTH 30 (MISCELLANEOUS) IMPLANT
STATION PROTECTION PRESSURIZED (MISCELLANEOUS) IMPLANT

## 2022-08-11 NOTE — Progress Notes (Signed)
Patient arrived to 3E12. VS stable, patient oriented to room, call bell in reach. Patient's wife at bedside. Cell phone, Bil hearing aids, lower partial dentures, clothes at bedside.

## 2022-08-11 NOTE — Progress Notes (Addendum)
PROGRESS NOTE    [name]  ZOX:[id number] DOB: 1934/02/08 DOA: 08/10/2022 PCP: [name], MD   Brief Narrative:  [name] is a 89 y.o. male with medical history significant for chronic dizziness/balance issues for at least 2 years, coronary artery disease status post PCI with stenting, paroxysmal A-fib, previously on Xarelto and no longer on DOAC after GI bleeding episodes, prostate cancer status post radical prostatectomy in 1998, hypertension, hyperlipidemia with intolerance to statin, history of carotid artery disease status post left carotid endarterectomy in 1998, OSA on CPAP, bilateral sensorineural hearing dysfunction, and TIA, who presented to [facility] ED from home via EMS due to multiple syncopal episodes at home.  Patient indicates that he had no prodrome or symptoms other than noticing that the room had moved and that approximately halfway through his fall he notes loss of consciousness and awoke on the floor with his wife over him.  Loss of consciousness may be a few minutes, no postictal or confusion once aroused.  Given second fall where he struck his head on the kitchen floor the wife called EMS for transport to the hospital. Imaging unremarkable for fracture or bleed or acute findings.  Hospitalist called for admission.  Assessment & Plan:   Principal Problem:   Syncope and collapse  Syncope and collapse, unclear etiology **Update - cardiology concerned for 2nd degree heart block as cause of syncopal episodes - EP to follow for discussion on permanent pacemaker. -Questionable cardiac in nature given patient's prolonged QTc although he denies palpitations or symptoms prior to fall -Cardiology consulted to evaluate cardiogenic etiology of syncope given agonal breathing and questionable bradycardia vs pause noted overnight where CODE BLUE was called but patient was apparently back to baseline after a single chest compression. -Unclear if patient's chronic dizziness had  masked any prodrome prior to fall -CT head without acute intracranial findings -Follow-up orthostatics, telemetry, echo    Elevated troponin, suspect demand ischemia in the setting of syncope -Minimally elevated, ACS ruled out no signs or symptoms of chest pain nausea vomiting or equivalent -Echocardiogram pending -Likely secondary to above syncopal episode   Prolonged Qtc -Admission twelve-lead EKG showing QTc 505. -Unclear if prolonged Qtc/dysrhythmia are contributing to above syncope or episode of agonal breathing/code blue this morning.   Paroxysmal A-fib, previously on Xarelto  No longer on DOAC after history of GI bleeding episodes Not on rate control agents, rate is controlled Continue telemetry monitoring   Elevated AST, bilirubin -Likely secondary to above syncope, questionable hypotensive event versus bradycardic or dysrhythmic event -Avoid hepatotoxic medications -Repeat labs minimally elevated from prior -Right upper quadrant ultrasound pending  Hypothyroidism Resume home regimen.   BPH Resume home Flomax Monitor UO   CKD 3B Creatinine at baseline 1.46 with GFR 47. Avoid nephrotoxic agents, dehydration and hypotension. Gentle IV fluid hydration LR 50 cc/h x 1 day. Monitor urine output with strict I's and O's.   Hyperlipidemia Resume home meds ADL   Chronic diastolic CHF Euvolemic on exam On SGLT2 inhibitor-will get a UA Strict I's and O's and daily weight  DVT prophylaxis: Holding chemical prophylaxis given high risk for GI bleed given history  Code Status: Full Family Communication: Wife at bedside  Status is: Inpatient  Dispo: The patient is from: Home              Anticipated d/c is to: To be determined              Anticipated d/c date is: 24 to  48 hours              Patient currently not medically stable for discharge  Consultants:  Cardiology, PCCM  Procedures:  None  Antimicrobials:  None indicated  Subjective: Questionable episode  of unresponsiveness versus bradycardia or asystole earlier this morning received 1 single chest compression with resolution of symptoms back to baseline soon after.  Patient does not recall this event.  Otherwise patient denies diarrhea constipation headache fevers chills or chest pain.  Objective: Vitals:   08/11/22 0548 08/11/22 0600 08/11/22 0604 08/11/22 0615  BP: (!) 167/89 (!) 164/86  (!) 157/80  Pulse: 96 91  88  Resp: (!) 22 (!) 23  (!) 25  Temp:   97.8 F (36.6 C)   TempSrc:   Oral   SpO2: 96% 93%  95%  Weight:      Height:        Intake/Output Summary (Last 24 hours) at 08/11/2022 0755 Last data filed at 08/11/2022 0701 Gross per 24 hour  Intake 354.49 ml  Output --  Net 354.49 ml   Filed Weights   08/10/22 1633  Weight: 91.6 kg    Examination:  General:  Pleasantly resting in bed, No acute distress. HEENT:  Normocephalic atraumatic.  Sclerae nonicteric, noninjected.  Extraocular movements intact bilaterally. Neck:  Without mass or deformity.  Trachea is midline. Lungs:  Clear to auscultate bilaterally without rhonchi, wheeze, or rales. Heart:  Regular rate and rhythm.  Without murmurs, rubs, or gallops. Abdomen:  Soft, nontender, nondistended.  Without guarding or rebound. Extremities: Without cyanosis, clubbing, edema, or obvious deformity. Vascular:  Dorsalis pedis and posterior tibial pulses palpable bilaterally. Skin:  Warm and dry, no erythema, no ulcerations.  Data Reviewed: I have personally reviewed following labs and imaging studies  CBC: Recent Labs  Lab 08/10/22 1645 08/11/22 0242  WBC 4.4 6.7  NEUTROABS 2.4  --   HGB 13.6 14.0  HCT 39.3 40.1  MCV 102.3* 102.8*  PLT 95* 88*   Basic Metabolic Panel: Recent Labs  Lab 08/10/22 1645 08/11/22 0242  NA 138 136  K 3.6 4.0  CL 105 105  CO2 24 22  GLUCOSE 97 137*  BUN 13 13  CREATININE 1.46* 1.36*  CALCIUM 8.8* 8.9  MG  --  1.8  PHOS  --  3.0   GFR: Estimated Creatinine Clearance: 40.5  mL/min (A) (by C-G formula based on SCr of 1.36 mg/dL (H)). Liver Function Tests: Recent Labs  Lab 08/10/22 1645 08/11/22 0242  AST 43* 52*  ALT 25 25  ALKPHOS 91 87  BILITOT 2.2* 4.1*  PROT 6.7 6.6  ALBUMIN 3.6 3.5   No results for input(s): "LIPASE", "AMYLASE" in the last 168 hours. No results for input(s): "AMMONIA" in the last 168 hours. Coagulation Profile: No results for input(s): "INR", "PROTIME" in the last 168 hours. Cardiac Enzymes: No results for input(s): "CKTOTAL", "CKMB", "CKMBINDEX", "TROPONINI" in the last 168 hours. BNP (last 3 results) No results for input(s): "PROBNP" in the last 8760 hours. HbA1C: No results for input(s): "HGBA1C" in the last 72 hours. CBG: No results for input(s): "GLUCAP" in the last 168 hours. Lipid Profile: No results for input(s): "CHOL", "HDL", "LDLCALC", "TRIG", "CHOLHDL", "LDLDIRECT" in the last 72 hours. Thyroid Function Tests: No results for input(s): "TSH", "T4TOTAL", "FREET4", "T3FREE", "THYROIDAB" in the last 72 hours. Anemia Panel: No results for input(s): "VITAMINB12", "FOLATE", "FERRITIN", "TIBC", "IRON", "RETICCTPCT" in the last 72 hours. Sepsis Labs: No results for input(s): "PROCALCITON", "LATICACIDVEN"  in the last 168 hours.  Recent Results (from the past 240 hour(s))  Resp panel by RT-PCR (RSV, Flu A&B, Covid) Anterior Nasal Swab     Status: None   Collection Time: 08/10/22  5:00 PM   Specimen: Anterior Nasal Swab  Result Value Ref Range Status   SARS Coronavirus 2 by RT PCR NEGATIVE NEGATIVE Final    Comment: (NOTE) SARS-CoV-2 target nucleic acids are NOT DETECTED.  The SARS-CoV-2 RNA is generally detectable in upper respiratory specimens during the acute phase of infection. The lowest concentration of SARS-CoV-2 viral copies this assay can detect is 138 copies/mL. A negative result does not preclude SARS-Cov-2 infection and should not be used as the sole basis for treatment or other patient management  decisions. A negative result may occur with  improper specimen collection/handling, submission of specimen other than nasopharyngeal swab, presence of viral mutation(s) within the areas targeted by this assay, and inadequate number of viral copies(<138 copies/mL). A negative result must be combined with clinical observations, patient history, and epidemiological information. The expected result is Negative.  Fact Sheet for Patients:  EntrepreneurPulse.com.au  Fact Sheet for Healthcare Providers:  IncredibleEmployment.be  This test is no t yet approved or cleared by the [place] FDA and  has been authorized for detection and/or diagnosis of SARS-CoV-2 by FDA under an Emergency Use Authorization (EUA). This EUA will remain  in effect (meaning this test can be used) for the duration of the COVID-19 declaration under Section 564(b)(1) of the Act, 21 U.S.C.section 360bbb-3(b)(1), unless the authorization is terminated  or revoked sooner.       Influenza A by PCR NEGATIVE NEGATIVE Final   Influenza B by PCR NEGATIVE NEGATIVE Final    Comment: (NOTE) The Xpert Xpress SARS-CoV-2/FLU/RSV plus assay is intended as an aid in the diagnosis of influenza from Nasopharyngeal swab specimens and should not be used as a sole basis for treatment. Nasal washings and aspirates are unacceptable for Xpert Xpress SARS-CoV-2/FLU/RSV testing.  Fact Sheet for Patients: EntrepreneurPulse.com.au  Fact Sheet for Healthcare Providers: IncredibleEmployment.be  This test is not yet approved or cleared by the [place] FDA and has been authorized for detection and/or diagnosis of SARS-CoV-2 by FDA under an Emergency Use Authorization (EUA). This EUA will remain in effect (meaning this test can be used) for the duration of the COVID-19 declaration under Section 564(b)(1) of the Act, 21 U.S.C. section 360bbb-3(b)(1), unless the  authorization is terminated or revoked.     Resp Syncytial Virus by PCR NEGATIVE NEGATIVE Final    Comment: (NOTE) Fact Sheet for Patients: EntrepreneurPulse.com.au  Fact Sheet for Healthcare Providers: IncredibleEmployment.be  This test is not yet approved or cleared by the [place] FDA and has been authorized for detection and/or diagnosis of SARS-CoV-2 by FDA under an Emergency Use Authorization (EUA). This EUA will remain in effect (meaning this test can be used) for the duration of the COVID-19 declaration under Section 564(b)(1) of the Act, 21 U.S.C. section 360bbb-3(b)(1), unless the authorization is terminated or revoked.  Performed at [facility], [place] [address] [place], [place] [address]          Radiology Studies: DG Chest Portable 1 View  Result Date: 08/11/2022 CLINICAL DATA:  Fall with chest compression EXAM: PORTABLE CHEST 1 VIEW COMPARISON:  Yesterday FINDINGS: Stable heart size and mediastinal contours. Mild interstitial coarsening at the bases. There is no edema, consolidation, effusion, or pneumothorax. IMPRESSION: No acute or interval finding. Electronically Signed   By:  Jorje Guild M.D.   On: 08/11/2022 06:20   CT Head Wo Contrast  Result Date: 08/10/2022 CLINICAL DATA:  Fall, hit head on floor, hematoma to back of head EXAM: CT HEAD WITHOUT CONTRAST CT CERVICAL SPINE WITHOUT CONTRAST TECHNIQUE: Multidetector CT imaging of the head and cervical spine was performed following the standard protocol without intravenous contrast. Multiplanar CT image reconstructions of the cervical spine were also generated. RADIATION DOSE REDUCTION: This exam was performed according to the departmental dose-optimization program which includes automated exposure control, adjustment of the mA and/or kV according to patient size and/or use of iterative reconstruction technique. COMPARISON:  None Available. FINDINGS: CT HEAD FINDINGS  Brain: No evidence of acute infarction, hemorrhage, hydrocephalus, extra-axial collection or mass lesion/mass effect. Periventricular and deep white matter hypodensity. Vascular: No hyperdense vessel or unexpected calcification. Skull: Normal. Negative for fracture or focal lesion. Sinuses/Orbits: No acute finding. Other: Soft tissue contusion of the scalp vertex (series 4, image 8). CT CERVICAL SPINE FINDINGS Alignment: Normal. Skull base and vertebrae: No acute fracture. No primary bone lesion or focal pathologic process. Soft tissues and spinal canal: No prevertebral fluid or swelling. No visible canal hematoma. Disc levels: Focally moderate disc space height loss and osteophytosis of C5-C7 with otherwise preserved disc spaces. Upper chest: Negative. Other: None. IMPRESSION: 1. No acute intracranial pathology. Small-vessel white matter disease. 2. Soft tissue contusion of the scalp vertex. 3. No fracture or static subluxation of the cervical spine. Electronically Signed   By: Delanna Ahmadi M.D.   On: 08/10/2022 19:20   CT Cervical Spine Wo Contrast  Result Date: 08/10/2022 CLINICAL DATA:  Fall, hit head on floor, hematoma to back of head EXAM: CT HEAD WITHOUT CONTRAST CT CERVICAL SPINE WITHOUT CONTRAST TECHNIQUE: Multidetector CT imaging of the head and cervical spine was performed following the standard protocol without intravenous contrast. Multiplanar CT image reconstructions of the cervical spine were also generated. RADIATION DOSE REDUCTION: This exam was performed according to the departmental dose-optimization program which includes automated exposure control, adjustment of the mA and/or kV according to patient size and/or use of iterative reconstruction technique. COMPARISON:  None Available. FINDINGS: CT HEAD FINDINGS Brain: No evidence of acute infarction, hemorrhage, hydrocephalus, extra-axial collection or mass lesion/mass effect. Periventricular and deep white matter hypodensity. Vascular: No  hyperdense vessel or unexpected calcification. Skull: Normal. Negative for fracture or focal lesion. Sinuses/Orbits: No acute finding. Other: Soft tissue contusion of the scalp vertex (series 4, image 8). CT CERVICAL SPINE FINDINGS Alignment: Normal. Skull base and vertebrae: No acute fracture. No primary bone lesion or focal pathologic process. Soft tissues and spinal canal: No prevertebral fluid or swelling. No visible canal hematoma. Disc levels: Focally moderate disc space height loss and osteophytosis of C5-C7 with otherwise preserved disc spaces. Upper chest: Negative. Other: None. IMPRESSION: 1. No acute intracranial pathology. Small-vessel white matter disease. 2. Soft tissue contusion of the scalp vertex. 3. No fracture or static subluxation of the cervical spine. Electronically Signed   By: Delanna Ahmadi M.D.   On: 08/10/2022 19:20   CT CHEST ABDOMEN PELVIS W CONTRAST  Result Date: 08/10/2022 CLINICAL DATA:  Fall, syncope, hit head, complains of thoracic back pain to palpation, history of prostate cancer * Tracking Code: BO * EXAM: CT CHEST, ABDOMEN, AND PELVIS WITH CONTRAST CT THORACIC AND LUMBAR SPINE WITH CONTRAST TECHNIQUE: Multidetector CT imaging of the chest, abdomen and pelvis was performed following the standard protocol during bolus administration of intravenous contrast. Multidetector CT imaging of the thoracic  and lumbar spine was performed following the standard protocol during bolus administration of intravenous contrast. RADIATION DOSE REDUCTION: This exam was performed according to the departmental dose-optimization program which includes automated exposure control, adjustment of the mA and/or kV according to patient size and/or use of iterative reconstruction technique. CONTRAST:  39m OMNIPAQUE IOHEXOL 350 MG/ML SOLN COMPARISON:  CT abdomen pelvis, 10/18/2020 FINDINGS: CT CHEST FINDINGS Cardiovascular: Aortic atherosclerosis. Dense aortic valve calcifications. Normal heart size.  Three-vessel coronary artery calcifications. Very dense mitral annulus calcifications. No pericardial effusion. Mediastinum/Nodes: No enlarged mediastinal, hilar, or axillary lymph nodes. Small hiatal hernia. Thyroid gland, trachea, and esophagus demonstrate no significant findings. Lungs/Pleura: Mild paraseptal emphysema. Diffuse bilateral bronchial wall thickening. Dependent bibasilar scarring and or atelectasis. No pleural effusion or pneumothorax. Musculoskeletal: No chest wall mass or suspicious osseous lesions identified. CT ABDOMEN PELVIS FINDINGS Hepatobiliary: No solid liver abnormality is seen. Small benign left lobe liver cysts, for which no further follow-up or characterization is required. Coarse nodular cirrhotic morphology of the liver. Status post cholecystectomy. No biliary ductal dilatation. Pancreas: Unremarkable. No pancreatic ductal dilatation or surrounding inflammatory changes. Spleen: Splenomegaly, maximum coronal span 15.0 cm. Adrenals/Urinary Tract: Adrenal glands are unremarkable. Severely atrophic right kidney with a small nonobstructive calculus of the midportion. Left kidney is normal. No ureteral calculi or hydronephrosis. Bladder is unremarkable. Stomach/Bowel: Stomach is within normal limits. Appendix appears normal. No evidence of bowel wall thickening, distention, or inflammatory changes. Descending and sigmoid diverticulosis. Vascular/Lymphatic: Aortic atherosclerosis. 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 No enlarged abdominal or pelvic lymph nodes. Reproductive: Status post prostatectomy. Other: Small fat containing bilateral inguinal hernias.  No ascites. Musculoskeletal: No acute osseous findings. CT THORACIC AND LUMBAR SPINE FINDINGS Alignment: Gentle dextroscoliosis of the thoracic spine, apex T6, with otherwise normal thoracic kyphosis. Normal lumbar lordosis. Vertebral bodies: Osteopenia. Probable very subtle superior  endplate deformity of L1 with no significant anterior height loss (series 6, image 34). Unchanged, chronic Schmorl deformity of the superior endplate of L3 (series 6, image 41). No fracture or dislocation. Disc spaces: Mild multilevel disc space height loss and osteophytosis throughout the thoracic and lumbar spine, with bridging osteophytosis of the mid to lower thoracic spine in keeping with DISH. Paraspinous soft tissues: Unremarkable. IMPRESSION: 1. No CT evidence of acute traumatic injury to the chest, abdomen, or pelvis. 2. Probable very subtle superior endplate deformity of L1 with no significant anterior height loss. Correlate for acute point tenderness. 3. Osteopenia. 4. Coronary artery disease. 5. Dense aortic valve calcifications. Correlate for echocardiographic evidence of aortic valve dysfunction. 6. Cirrhosis and splenomegaly. 7. Severely atrophic right kidney with a small nonobstructive calculus of the midportion. 8. Descending and sigmoid diverticulosis without evidence of acute diverticulitis. 9. Status post prostatectomy. No evidence of lymphadenopathy or metastatic disease in the abdomen or pelvis. 10. Aortic atherosclerosis. 11. Emphysema. Aortic Atherosclerosis (ICD10-I70.0) and Emphysema (ICD10-J43.9). Electronically Signed   By: ADelanna AhmadiM.D.   On: 08/10/2022 19:16   CT L-SPINE NO CHARGE  Result Date: 08/10/2022 CLINICAL DATA:  Fall, syncope, hit head, complains of thoracic back pain to palpation, history of prostate cancer * Tracking Code: BO * EXAM: CT CHEST, ABDOMEN, AND PELVIS WITH CONTRAST CT THORACIC AND LUMBAR SPINE WITH CONTRAST TECHNIQUE: Multidetector CT imaging of the chest, abdomen and pelvis was performed following the standard protocol during bolus administration of intravenous contrast. Multidetector CT imaging of the thoracic and lumbar spine was performed following the standard protocol during bolus administration of  intravenous contrast. RADIATION DOSE REDUCTION:  This exam was performed according to the departmental dose-optimization program which includes automated exposure control, adjustment of the mA and/or kV according to patient size and/or use of iterative reconstruction technique. CONTRAST:  21m OMNIPAQUE IOHEXOL 350 MG/ML SOLN COMPARISON:  CT abdomen pelvis, 10/18/2020 FINDINGS: CT CHEST FINDINGS Cardiovascular: Aortic atherosclerosis. Dense aortic valve calcifications. Normal heart size. Three-vessel coronary artery calcifications. Very dense mitral annulus calcifications. No pericardial effusion. Mediastinum/Nodes: No enlarged mediastinal, hilar, or axillary lymph nodes. Small hiatal hernia. Thyroid gland, trachea, and esophagus demonstrate no significant findings. Lungs/Pleura: Mild paraseptal emphysema. Diffuse bilateral bronchial wall thickening. Dependent bibasilar scarring and or atelectasis. No pleural effusion or pneumothorax. Musculoskeletal: No chest wall mass or suspicious osseous lesions identified. CT ABDOMEN PELVIS FINDINGS Hepatobiliary: No solid liver abnormality is seen. Small benign left lobe liver cysts, for which no further follow-up or characterization is required. Coarse nodular cirrhotic morphology of the liver. Status post cholecystectomy. No biliary ductal dilatation. Pancreas: Unremarkable. No pancreatic ductal dilatation or surrounding inflammatory changes. Spleen: Splenomegaly, maximum coronal span 15.0 cm. Adrenals/Urinary Tract: Adrenal glands are unremarkable. Severely atrophic right kidney with a small nonobstructive calculus of the midportion. Left kidney is normal. No ureteral calculi or hydronephrosis. Bladder is unremarkable. Stomach/Bowel: Stomach is within normal limits. Appendix appears normal. No evidence of bowel wall thickening, distention, or inflammatory changes. Descending and sigmoid diverticulosis. Vascular/Lymphatic: Aortic atherosclerosis. 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 No enlarged abdominal or pelvic lymph nodes. Reproductive: Status post prostatectomy. Other: Small fat containing bilateral inguinal hernias.  No ascites. Musculoskeletal: No acute osseous findings. CT THORACIC AND LUMBAR SPINE FINDINGS Alignment: Gentle dextroscoliosis of the thoracic spine, apex T6, with otherwise normal thoracic kyphosis. Normal lumbar lordosis. Vertebral bodies: Osteopenia. Probable very subtle superior endplate deformity of L1 with no significant anterior height loss (series 6, image 34). Unchanged, chronic Schmorl deformity of the superior endplate of L3 (series 6, image 41). No fracture or dislocation. Disc spaces: Mild multilevel disc space height loss and osteophytosis throughout the thoracic and lumbar spine, with bridging osteophytosis of the mid to lower thoracic spine in keeping with DISH. Paraspinous soft tissues: Unremarkable. IMPRESSION: 1. No CT evidence of acute traumatic injury to the chest, abdomen, or pelvis. 2. Probable very subtle superior endplate deformity of L1 with no significant anterior height loss. Correlate for acute point tenderness. 3. Osteopenia. 4. Coronary artery disease. 5. Dense aortic valve calcifications. Correlate for echocardiographic evidence of aortic valve dysfunction. 6. Cirrhosis and splenomegaly. 7. Severely atrophic right kidney with a small nonobstructive calculus of the midportion. 8. Descending and sigmoid diverticulosis without evidence of acute diverticulitis. 9. Status post prostatectomy. No evidence of lymphadenopathy or metastatic disease in the abdomen or pelvis. 10. Aortic atherosclerosis. 11. Emphysema. Aortic Atherosclerosis (ICD10-I70.0) and Emphysema (ICD10-J43.9). Electronically Signed   By: ADelanna AhmadiM.D.   On: 08/10/2022 19:16   CT T-SPINE NO CHARGE  Result Date: 08/10/2022 CLINICAL DATA:  Fall, syncope, hit head, complains of thoracic back pain to palpation, history of prostate cancer * Tracking Code: BO * EXAM: CT  CHEST, ABDOMEN, AND PELVIS WITH CONTRAST CT THORACIC AND LUMBAR SPINE WITH CONTRAST TECHNIQUE: Multidetector CT imaging of the chest, abdomen and pelvis was performed following the standard protocol during bolus administration of intravenous contrast. Multidetector CT imaging of the thoracic and lumbar spine was performed following the standard protocol during bolus administration of intravenous contrast. RADIATION DOSE REDUCTION: This exam was performed according to the  departmental dose-optimization program which includes automated exposure control, adjustment of the mA and/or kV according to patient size and/or use of iterative reconstruction technique. CONTRAST:  56m OMNIPAQUE IOHEXOL 350 MG/ML SOLN COMPARISON:  CT abdomen pelvis, 10/18/2020 FINDINGS: CT CHEST FINDINGS Cardiovascular: Aortic atherosclerosis. Dense aortic valve calcifications. Normal heart size. Three-vessel coronary artery calcifications. Very dense mitral annulus calcifications. No pericardial effusion. Mediastinum/Nodes: No enlarged mediastinal, hilar, or axillary lymph nodes. Small hiatal hernia. Thyroid gland, trachea, and esophagus demonstrate no significant findings. Lungs/Pleura: Mild paraseptal emphysema. Diffuse bilateral bronchial wall thickening. Dependent bibasilar scarring and or atelectasis. No pleural effusion or pneumothorax. Musculoskeletal: No chest wall mass or suspicious osseous lesions identified. CT ABDOMEN PELVIS FINDINGS Hepatobiliary: No solid liver abnormality is seen. Small benign left lobe liver cysts, for which no further follow-up or characterization is required. Coarse nodular cirrhotic morphology of the liver. Status post cholecystectomy. No biliary ductal dilatation. Pancreas: Unremarkable. No pancreatic ductal dilatation or surrounding inflammatory changes. Spleen: Splenomegaly, maximum coronal span 15.0 cm. Adrenals/Urinary Tract: Adrenal glands are unremarkable. Severely atrophic right kidney with a small  nonobstructive calculus of the midportion. Left kidney is normal. No ureteral calculi or hydronephrosis. Bladder is unremarkable. Stomach/Bowel: Stomach is within normal limits. Appendix appears normal. No evidence of bowel wall thickening, distention, or inflammatory changes. Descending and sigmoid diverticulosis. Vascular/Lymphatic: Aortic atherosclerosis. 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 No enlarged abdominal or pelvic lymph nodes. Reproductive: Status post prostatectomy. Other: Small fat containing bilateral inguinal hernias.  No ascites. Musculoskeletal: No acute osseous findings. CT THORACIC AND LUMBAR SPINE FINDINGS Alignment: Gentle dextroscoliosis of the thoracic spine, apex T6, with otherwise normal thoracic kyphosis. Normal lumbar lordosis. Vertebral bodies: Osteopenia. Probable very subtle superior endplate deformity of L1 with no significant anterior height loss (series 6, image 34). Unchanged, chronic Schmorl deformity of the superior endplate of L3 (series 6, image 41). No fracture or dislocation. Disc spaces: Mild multilevel disc space height loss and osteophytosis throughout the thoracic and lumbar spine, with bridging osteophytosis of the mid to lower thoracic spine in keeping with DISH. Paraspinous soft tissues: Unremarkable. IMPRESSION: 1. No CT evidence of acute traumatic injury to the chest, abdomen, or pelvis. 2. Probable very subtle superior endplate deformity of L1 with no significant anterior height loss. Correlate for acute point tenderness. 3. Osteopenia. 4. Coronary artery disease. 5. Dense aortic valve calcifications. Correlate for echocardiographic evidence of aortic valve dysfunction. 6. Cirrhosis and splenomegaly. 7. Severely atrophic right kidney with a small nonobstructive calculus of the midportion. 8. Descending and sigmoid diverticulosis without evidence of acute diverticulitis. 9. Status post prostatectomy. No evidence of  lymphadenopathy or metastatic disease in the abdomen or pelvis. 10. Aortic atherosclerosis. 11. Emphysema. Aortic Atherosclerosis (ICD10-I70.0) and Emphysema (ICD10-J43.9). Electronically Signed   By: ADelanna AhmadiM.D.   On: 08/10/2022 19:16   DG Pelvis Portable  Result Date: 08/10/2022 CLINICAL DATA:  Fall, right-sided hip pain EXAM: PORTABLE PELVIS 1-2 VIEWS COMPARISON:  None Available. FINDINGS: Osteopenia. There is no evidence of displaced pelvic fracture or diastasis. No pelvic bone lesions are seen. IMPRESSION: Osteopenia. No displaced fracture of the pelvis or bilateral proximal femurs seen in single frontal view. Please note that plain radiographs are significantly insensitive for hip and pelvic fracture. Recommend CT or MRI to more sensitively evaluate if there is high clinical suspicion for fracture. Electronically Signed   By: ADelanna AhmadiM.D.   On: 08/10/2022 17:11   DG Chest Portable 1 View  Result Date: 08/10/2022  CLINICAL DATA:  Back and right-sided chest pain following recent fall, initial encounter EXAM: PORTABLE CHEST 1 VIEW COMPARISON:  11/10/2020 FINDINGS: The heart size and mediastinal contours are within normal limits. Both lungs are clear. Bony structures show a vague lucency in the superior aspect of the scapula. An undisplaced fracture cannot be totally excluded given the patient's clinical history. IMPRESSION: No focal infiltrate noted. Changes suspicious for right scapular fracture. Correlate to point tenderness. Cross-sectional imaging may be helpful as clinically indicated. Electronically Signed   By: Inez Catalina M.D.   On: 08/10/2022 17:10     Scheduled Meds:  ezetimibe  10 mg Oral Daily   ipratropium  2 spray Nasal BID   thyroid  120 mg Oral Daily   Continuous Infusions:  lactated ringers 50 mL/hr at 08/11/22 0701     LOS: 0 days   Time spent: 79mn  Jarryn Altland C Chadric Kimberley, DO [facility]  If 7PM-7AM, please contact  night-coverage [email or web address]  08/11/2022, 7:55 AM

## 2022-08-11 NOTE — Consult Note (Signed)
NAME:  [name], MRN:  [id number], DOB:  Mar 25, 1934, LOS: 0 ADMISSION DATE:  08/10/2022, CONSULTATION DATE: 08/11/2022 REFERRING MD: [facility], CHIEF COMPLAINT: Status post fall  History of Present Illness:  [name] is a very pleasant 89 year old male with a plethora of health issues that are well-documented below.  He reports falling when he does not have his cane to assist him status post 2 falls the last getting the back of his head and losing consciousness.  EMS was activated he required CPR for 2 minutes without pharmaceutical intervention he was awake and alert and hemodynamically stable.  Transported to the emergency department where he was to be evaluated by cardiology for possible syncope.  CT of the head was unremarkable on 08/10/2022.  Pulmonary critical care was asked to evaluate for possible ICU admission.  He is on room air.  Hemodynamically stable.  Sinus rhythm.  Needs further evaluation by cardiology.  He can be admitted to progressive care unit and pulmonary critical care will be available as needed.  Pertinent  Medical History   Past Medical History:  Diagnosis Date   Anginal pain ([place])    Anxiety    " OCCASIONAL"   Arthritis    Asthma due to seasonal allergies    uses inhalers prn   Carotid artery occlusion    Left   Complication of anesthesia    " had tremors after prostate surgery   Coronary artery disease    Diverticulitis    recurrent   Family history of anesthesia complication    MOTHER    GERD (gastroesophageal reflux disease)    H/O hiatal hernia    Hearing aid worn    Hyperlipemia    Hypertension    Kidney stone    lithotripsy 6073 w complications, req stents, Dr Rosana Hoes   Prostate CA [facility])    Shortness of breath    Thyroid disease    TIA (transient ischemic attack)    Tinnitus      Significant Hospital Events: Including procedures, antibiotic start and stop dates in addition to other pertinent events     Interim History / Subjective:  Status  post fall with CPR  Objective   Blood pressure (!) 157/80, pulse 88, temperature 97.8 F (36.6 C), temperature source Oral, resp. rate (!) 25, height '5\' 6"'$  (1.676 m), weight 91.6 kg, SpO2 95 %.        Intake/Output Summary (Last 24 hours) at 08/11/2022 0751 Last data filed at 08/11/2022 0701 Gross per 24 hour  Intake 354.49 ml  Output --  Net 354.49 ml   Filed Weights   08/10/22 1633  Weight: 91.6 kg    Examination: General: Awake alert no acute distress extremely hard of hearing HENT: No JVD or lymphadenopathy is appreciated Lungs: Decreased breath sounds in the bases Cardiovascular: Heart sounds are regular regular rate rhythm sinus rhythm Abdomen: Obese soft nontender Extremities: Mild lower extremity edema Neuro: Appears to be intact but is extremely hard of hearing GU: Voids amber urine  Resolved Hospital Problem list     Assessment & Plan:  Status post loss of balance with complaints of left-sided weakness with a history of TIAs which resulted in CPR for 2 minutes without pharmaceutical intervention.  No acute distress on exam in the emergency room. No need for intensive care admission Agree with cardiology assessment he is followed by [name] Progressive care unit admission [facility] Pulmonary critical care available as needed  Paroxysmal atrial fibrillation not  on anticoagulation secondary to GI bleed Coronary artery disease status post stent of the RCA Hypothyroidism Diabetes mellitus Hypertension Carotid stenosis Obesity History of prostate cancer Obstructive sleep apnea Poor balance Chronic dizziness History of TIA Renal insufficiency All per primary  Best Practice (right click and "Reselect all SmartList Selections" daily)   Diet/type: Regular consistency (see orders) DVT prophylaxis: not indicated GI prophylaxis: PPI Lines: N/A Foley:  N/A Code Status:  full code Last date of multidisciplinary goals of care discussion  [tbd]  Labs   CBC: Recent Labs  Lab 08/10/22 1645 08/11/22 0242  WBC 4.4 6.7  NEUTROABS 2.4  --   HGB 13.6 14.0  HCT 39.3 40.1  MCV 102.3* 102.8*  PLT 95* 88*    Basic Metabolic Panel: Recent Labs  Lab 08/10/22 1645 08/11/22 0242  NA 138 136  K 3.6 4.0  CL 105 105  CO2 24 22  GLUCOSE 97 137*  BUN 13 13  CREATININE 1.46* 1.36*  CALCIUM 8.8* 8.9  MG  --  1.8  PHOS  --  3.0   GFR: Estimated Creatinine Clearance: 40.5 mL/min (A) (by C-G formula based on SCr of 1.36 mg/dL (H)). Recent Labs  Lab 08/10/22 1645 08/11/22 0242  WBC 4.4 6.7    Liver Function Tests: Recent Labs  Lab 08/10/22 1645 08/11/22 0242  AST 43* 52*  ALT 25 25  ALKPHOS 91 87  BILITOT 2.2* 4.1*  PROT 6.7 6.6  ALBUMIN 3.6 3.5   No results for input(s): "LIPASE", "AMYLASE" in the last 168 hours. No results for input(s): "AMMONIA" in the last 168 hours.  ABG No results found for: "PHART", "PCO2ART", "PO2ART", "HCO3", "TCO2", "ACIDBASEDEF", "O2SAT"   Coagulation Profile: No results for input(s): "INR", "PROTIME" in the last 168 hours.  Cardiac Enzymes: No results for input(s): "CKTOTAL", "CKMB", "CKMBINDEX", "TROPONINI" in the last 168 hours.  HbA1C: Hemoglobin A1C  Date/Time Value Ref Range Status  03/03/2017 12:00 AM 5.2  Final   Hgb A1c MFr Bld  Date/Time Value Ref Range Status  01/22/2022 04:12 PM 6.0 4.6 - 6.5 % Final    Comment:    Glycemic Control Guidelines for People with Diabetes:Non Diabetic:  <6%Goal of Therapy: <7%Additional Action Suggested:  >8%   10/13/2021 02:19 PM 5.9 4.6 - 6.5 % Final    Comment:    Glycemic Control Guidelines for People with Diabetes:Non Diabetic:  <6%Goal of Therapy: <7%Additional Action Suggested:  >8%     CBG: No results for input(s): "GLUCAP" in the last 168 hours.  Review of Systems:   10 point review of system taken, please see HPI for positives and negatives. Negative for chest pain Negative for headache Positive for loss of  balance Positive for lower back pain that increases with palpation  Past Medical History:  He,  has a past medical history of Anginal pain ([place]), Anxiety, Arthritis, Asthma due to seasonal allergies, Carotid artery occlusion, Complication of anesthesia, Coronary artery disease, Diverticulitis, Family history of anesthesia complication, GERD (gastroesophageal reflux disease), H/O hiatal hernia, Hearing aid worn, Hyperlipemia, Hypertension, Kidney stone, Prostate CA ([place]), Shortness of breath, Thyroid disease, TIA (transient ischemic attack), and Tinnitus.   Surgical History:   Past Surgical History:  Procedure Laterality Date   CARDIAC CATHETERIZATION  2008   minimal dz, Dr Cathie Olden   CARDIAC CATHETERIZATION N/A 10/09/2015   Procedure: Left Heart Cath and Coronary Angiography;  Surgeon: Peter M Martinique, MD;  Location: [place] CV LAB;  Service: Cardiovascular;  Laterality: N/A;   CARDIAC  CATHETERIZATION N/A 10/09/2015   Procedure: Intravascular Pressure Wire/FFR Study;  Surgeon: Peter M Martinique, MD;  Location: [place] CV LAB;  Service: Cardiovascular;  Laterality: N/A;   CARDIAC CATHETERIZATION N/A 10/09/2015   Procedure: Coronary Stent Intervention;  Surgeon: Peter M Martinique, MD;  Location: [place] CV LAB;  Service: Cardiovascular;  Laterality: N/A;   CAROTID ENDARTERECTOMY  Left   1998   CHOLECYSTECTOMY     CORONARY STENT PLACEMENT  10/09/2015   DES to RCA   ESOPHAGOGASTRODUODENOSCOPY (EGD) WITH PROPOFOL Left 03/26/2017   Procedure: ESOPHAGOGASTRODUODENOSCOPY (EGD) WITH PROPOFOL;  Surgeon: Arta Silence, MD;  Location: [facility];  Service: Endoscopy;  Laterality: Left;   LEFT HEART CATHETERIZATION WITH CORONARY ANGIOGRAM N/A 09/15/2013   Procedure: LEFT HEART CATHETERIZATION WITH CORONARY ANGIOGRAM;  Surgeon: Peter M Martinique, MD;  Location: [facility] CATH LAB;  Service: Cardiovascular;  Laterality: N/A;   PROSTATECTOMY  1998   radical for prostate cancer     Social History:   reports  that he quit smoking about 36 years ago. His smoking use included cigarettes. He has a 75.00 pack-year smoking history. He has never used smokeless tobacco. He reports current alcohol use of about 2.0 standard drinks of alcohol per week. He reports that he does not use drugs.   Family History:  His family history includes CAD in his child; Dementia in his sister; Hashimoto's thyroiditis in his child; Heart attack in his sister; Heart disease in his father, mother, and sister; Kidney failure in his father; Sjogren's syndrome in his child.   Allergies Allergies  Allergen Reactions   Ciprofloxacin Other (See Comments)    Hallucinations or jitteriness   Codeine Other (See Comments)    Hallucinations    Flexeril [Cyclobenzaprine] Other (See Comments)    "Made me feel goofy"   Imdur [Isosorbide Nitrate] Other (See Comments)    Caused headaches   Methocarbamol Other (See Comments)    "Made me feel goofy"   Other Other (See Comments)    CANNOT HAVE ANY FOODS WITH SEEDS    Strawberry Extract Other (See Comments)    CANNOT HAVE ANY FOODS WITH SEEDS   Sulfa Antibiotics Other (See Comments)    Chills and shaking- "serum sickness"    Sulfamethoxazole Other (See Comments)    Chills and shaking- "serum sickness"   Sulfonamide Derivatives Other (See Comments)    Chills and shaking "serum sickness"   Tomato Other (See Comments)    CANNOT HAVE ANY FOODS WITH SEEDS   Flagyl [Metronidazole] Rash     Home Medications  Prior to Admission medications   Medication Sig Start Date End Date Taking? Authorizing Provider  blood glucose meter kit and supplies KIT Dispense based on patient and insurance preference. Use up to four times daily as directed. 09/10/20   Geradine Girt, DO  clobetasol (TEMOVATE) 0.05 % external solution Apply 1 application topically daily as needed (itching on scalp). 10/06/17   [provider]  dorzolamide-timolol (COSOPT) 22.3-6.8 MG/ML ophthalmic solution Place 1 drop  into the right eye 2 times daily. 01/16/22   [provider]  ezetimibe (ZETIA) 10 MG tablet TAKE 1 TABLET BY MOUTH EVERY DAY 09/27/20   King, Peter M, MD  FARXIGA 5 MG TABS tablet TAKE 1 TABLET BY MOUTH EVERY DAY BEFORE BREAKFAST 05/11/22   Copland, Gay Filler, MD  ipratropium (ATROVENT) 0.03 % nasal spray Place 2 sprays into the nose 2 (two) times daily. 03/20/22   Copland, Gay Filler, MD  Naphazoline-Pheniramine (OPCON-A) 0.027-0.315 %  SOLN Place 1-2 drops into both eyes 3 (three) times daily as needed (for itching, redness, and/or irritation).    [provider]  nitroGLYCERIN (NITROLINGUAL) 0.4 MG/SPRAY spray Place 1 spray under the tongue every 5 (five) minutes x 3 doses as needed for chest pain. 07/18/18   King, Peter M, MD  tamsulosin (FLOMAX) 0.4 MG CAPS capsule Take 0.4 mg by mouth daily. 09/16/20   [provider]  thyroid (ARMOUR THYROID) 120 MG tablet Take 1 tablet (120 mg total) by mouth daily. 10/13/21   Copland, Gay Filler, MD  triamcinolone cream (KENALOG) 0.1 % Apply 1 application topically daily as needed (for rashes).     [provider]     Critical care time: 35 min    Richardson Landry Delight Bickle ACNP Acute Care Nurse Practitioner [place] Please consult Amion 08/11/2022, 7:53 AM

## 2022-08-11 NOTE — ED Notes (Signed)
This RN walked in to check on pt and noticed pt to be unresponsive and agonal breathing. This RN attempted sternal rubbing but no response. RN called for help. Charge RN at bedside and attempted chest compressions. However, when one compression was completed, pt became responsive with positive color change. MD Nevada Crane paged at this time.

## 2022-08-11 NOTE — Progress Notes (Signed)
eLink Physician-Brief Progress Note Patient Name: [name] DOB: 1933/07/16 MRN: [id number]   Date of Service  08/11/2022  HPI/Events of Note  89yo with Type II Mobitz with intermittent CHB that underwent RIJ temp pacemaker with cards. Alert but not oriented, no immediate pain, + presbycusis, reviewed rhythm and lead placement, seems appropriate at 40cm.   eICU Interventions  Post procedural XR ordered.   May need antihypertensives after he is settled in, will add PRN Hydral for SBP > 180     Intervention Category Evaluation Type: New Patient Evaluation  [name] 08/11/2022, 8:37 PM

## 2022-08-11 NOTE — ED Notes (Signed)
MD Nevada Crane at bedside. MD stated she would page cardiology and change level of care to progressive.

## 2022-08-11 NOTE — Care Management (Signed)
  Transition of Care [facility]) Screening Note   Patient Details  Name: [name] Date of Birth: 11/23/1933   Transition of Care [facility]) CM/SW Contact:    Carles Collet, RN Phone Number: 08/11/2022, 4:37 PM    Transition of Care Department [facility]) has reviewed patient and no TOC needs have been identified at this time. We will continue to monitor patient advancement through interdisciplinary progression rounds. If new patient transition needs arise, please place a TOC consult.

## 2022-08-11 NOTE — CV Procedure (Signed)
Procedure performed: Ultrasound-guided access of the right internal jugular vein and placement of a 6 French sheath, placement of a temporary transvenous pacemaker into the RV apex.  Indication: Patient with sinus arrest and asystole and cardiac syncope, Mobitz 2 AV block, in view of recurrent sinus arrest, patient was recommended for pacemaker insertion on a emergent basis.  Pacemaker; pacemaker is at 40 cm.  Capture was obtained at <0.5 mm.  Pacemaker set at 60 bpm at 10 mA, VVI mode.   Adrian Prows, MD, [facility] 08/11/2022, 8:13 PM Office: [phone] Fax: [phone] Pager: [phone]

## 2022-08-11 NOTE — Progress Notes (Addendum)
S: Paged by staff RN reporting patient had 2nd episode of ventricular standstill lasting >18 seconds. He was confused and not responding, episodes resolved spontaneously, no CODE blue was called.  O: Patient assessed at the bedside, sleepy, open eyes to voice, very hard of hearing, denied complaints. He is mildly confused, not combative. Cardiac S1S2, no murmur. Skin warm, distal pulse present. HR at 80s, BP 130/60, pox 91% RA. Complete heart block lasting 18 second noted on telemetry, currently in sinus rhythm. Wife is at bedside, states she is the decision maker if patient unable to make decisions for himself. She felt patient has been  more confused since the sinus arrest episode. She confirms patient would be full code. She agrees that if temporary wire is indicated and she wishes the patient to receive it. Risk versus benefit reviewed.   Plan: Intermittent complete heart block  Ventricular Standstill/ Asystole  - Cath lab staff and on call Dr Claiborne Billings notified, he is in active case and Dr Einar Gip has kindly agreed with assisting temp wire placement.  - Patient placed on zoll, advised staff transfer patient to ICU 2H.  - Consider brain image if confusion persistent after temp wire.

## 2022-08-11 NOTE — Progress Notes (Addendum)
Received a call from bedside RN, the patient was found unresponsive in his room in the ED. CPR was started and he quickly regained consciousness. Possible asystole on the monitor. Cardiology consulted.  Changed admission status to progressive care inpatient status.  Addendum:  PCCM will also see in consultation.  Discussed with Dr. Loanne Drilling.

## 2022-08-11 NOTE — Progress Notes (Signed)
   Now that telemetry from ED has uploaded, 30s ventricular standstill/pause noted.   If further significant pause occurs would plan Temp Wire.   Legrand Como [address] [place], [place]  08/11/2022 4:33 PM

## 2022-08-11 NOTE — Interval H&P Note (Signed)
History and Physical Interval Note:  08/11/2022 7:37 PM  [name]  has presented today for surgery, with the diagnosis of sinus pause.  The various methods of treatment have been discussed with the patient and family. After consideration of risks, benefits and other options for treatment, the patient has consented to  Procedure(s): TEMPORARY PACEMAKER (N/A) as a surgical intervention.  The patient's history has been reviewed, patient examined, no change in status, stable for surgery.  I have reviewed the patient's chart and labs.  Questions were answered to the patient's satisfaction.    I have reviewed the chart, patient is much more confused this evening probably related to cardiac arrest and seizure, I however was able to easily discussed with the patient and described placement of temporary pacemaker via right IJ.  Patient is willing to proceed.   Adrian Prows

## 2022-08-11 NOTE — ED Notes (Addendum)
Pt stating he is dizzy, nauseous, and experiencing blurred vision.

## 2022-08-11 NOTE — Progress Notes (Signed)
PT Cancellation Note  Patient Details Name: CARMELLO CABINESS MRN: [id number] DOB: May 01, 1934   Cancelled Treatment:    Reason Eval/Treat Not Completed: Other (comment);Medical issues which prohibited therapy (CODE BLUE this AM.Will hold at this time and follow up at later date/tiem when pt ready and schedule allows.)   Gwynneth Albright PT, [place] Office [phone]  08/11/22 12:14 PM

## 2022-08-11 NOTE — Progress Notes (Signed)
Patient had episode of ventricular standstill lasting >20 seconds. B/P 130/60 Margie Billet, NP came to bedside to assess patient. Patient to be transferred to [facility]. Cath lab came to get patient to take for temporary wire. Wife at bedside. Report called to Mable Fill, RN on [place].

## 2022-08-11 NOTE — ED Notes (Signed)
ED TO INPATIENT HANDOFF REPORT  ED Nurse Name and Phone #: Overton Mam [phone]  S Name/Age/Gender [name] 89 y.o. male Room/Bed: 011C/011C  Code Status   Code Status: Full Code  Home/SNF/Other Home Patient oriented to: self, place, time, and situation Is this baseline? Yes   Triage Complete: Triage complete  Chief Complaint Syncope and collapse [R55]  Triage Note Pt BIB [facility] for a witnessed fall with syncope.  Hit head on floor, hematoma to back of head.  No thinners for last 6 months. Complains of center thoracic pain on palpation.  PMS normal. A&O x4. 12-lead unremarkable.   EMs vitals. BP: 146/78, 98% RA, HR 74, CBG 102  18G L. AC   Allergies Allergies  Allergen Reactions   Ciprofloxacin Other (See Comments)    Hallucinations or jitteriness   Codeine Other (See Comments)    Hallucinations    Flexeril [Cyclobenzaprine] Other (See Comments)    "Made me feel goofy"   Imdur [Isosorbide Nitrate] Other (See Comments)    Caused headaches   Methocarbamol Other (See Comments)    "Made me feel goofy"   Other Other (See Comments)    CANNOT HAVE ANY FOODS WITH SEEDS    Strawberry Extract Other (See Comments)    CANNOT HAVE ANY FOODS WITH SEEDS   Sulfa Antibiotics Other (See Comments)    Chills and shaking- "serum sickness"    Sulfamethoxazole Other (See Comments)    Chills and shaking- "serum sickness"   Sulfonamide Derivatives Other (See Comments)    Chills and shaking "serum sickness"   Tomato Other (See Comments)    CANNOT HAVE ANY FOODS WITH SEEDS   Flagyl [Metronidazole] Rash    Level of Care/Admitting Diagnosis ED Disposition     ED Disposition  Admit   Condition  --   [place]: [place] [[id number]]  Level of Care: Progressive [102]  Admit to Progressive based on following criteria: CARDIOVASCULAR & THORACIC of moderate stability with acute coronary syndrome symptoms/low risk myocardial infarction/hypertensive  urgency/arrhythmias/heart failure potentially compromising stability and stable post cardiovascular intervention patients.  May admit patient to [facility] or [facility] if equivalent level of care is available:: No  Covid Evaluation: Asymptomatic - no recent exposure (last 10 days) testing not required  Diagnosis: Syncope and collapse [780.2.ICD-9-CM]  Admitting Physician: Kayleen Memos [[id number]]  Attending Physician: Kayleen Memos [[id number]]  Certification:: I certify this patient will need inpatient services for at least 2 midnights  Estimated Length of Stay: 2          B Medical/Surgery History Past Medical History:  Diagnosis Date   Anginal pain ([place])    Anxiety    " OCCASIONAL"   Arthritis    Asthma due to seasonal allergies    uses inhalers prn   Carotid artery occlusion    Left   Complication of anesthesia    " had tremors after prostate surgery   Coronary artery disease    Diverticulitis    recurrent   Family history of anesthesia complication    MOTHER    GERD (gastroesophageal reflux disease)    H/O hiatal hernia    Hearing aid worn    Hyperlipemia    Hypertension    Kidney stone    lithotripsy 1607 w complications, req stents, Dr Rosana Hoes   Prostate [facility])    Shortness of breath    Thyroid disease    TIA (transient ischemic attack)    Tinnitus  Past Surgical History:  Procedure Laterality Date   CARDIAC CATHETERIZATION  2008   minimal dz, Dr Cathie Olden   CARDIAC CATHETERIZATION N/A 10/09/2015   Procedure: Left Heart Cath and Coronary Angiography;  Surgeon: Peter M Martinique, MD;  Location: [place] [facility];  Service: Cardiovascular;  Laterality: N/A;   CARDIAC CATHETERIZATION N/A 10/09/2015   Procedure: Intravascular Pressure Wire/FFR Study;  Surgeon: Peter M Martinique, MD;  Location: [place] [facility];  Service: Cardiovascular;  Laterality: N/A;   CARDIAC CATHETERIZATION N/A 10/09/2015   Procedure: Coronary Stent Intervention;  Surgeon: Peter M Martinique, MD;   Location: [place] [facility];  Service: Cardiovascular;  Laterality: N/A;   CAROTID ENDARTERECTOMY  Left   1998   CHOLECYSTECTOMY     CORONARY STENT PLACEMENT  10/09/2015   DES to RCA   ESOPHAGOGASTRODUODENOSCOPY (EGD) WITH PROPOFOL Left 03/26/2017   Procedure: ESOPHAGOGASTRODUODENOSCOPY (EGD) WITH PROPOFOL;  Surgeon: Arta Silence, MD;  Location: [facility];  Service: Endoscopy;  Laterality: Left;   LEFT HEART CATHETERIZATION WITH CORONARY ANGIOGRAM N/A 09/15/2013   Procedure: LEFT HEART CATHETERIZATION WITH CORONARY ANGIOGRAM;  Surgeon: Peter M Martinique, MD;  Location: [facility];  Service: Cardiovascular;  Laterality: N/A;   PROSTATECTOMY  1998   radical for prostate cancer     A IV Location/Drains/Wounds Patient Lines/Drains/Airways Status     Active Line/Drains/Airways     Name Placement date Placement time Site Days   Peripheral IV 08/10/22 18 G Left Antecubital 08/10/22  1624  Antecubital  1            Intake/Output Last 24 hours  Intake/Output Summary (Last 24 hours) at 08/11/2022 1214 Last data filed at 08/11/2022 0701 Gross per 24 hour  Intake 354.49 ml  Output --  Net 354.49 ml    Labs/Imaging Results for orders placed or performed during the hospital encounter of 08/10/22 (from the past 48 hour(s))  Comprehensive metabolic panel     Status: Abnormal   Collection Time: 08/10/22  4:45 PM  Result Value Ref Range   Sodium 138 135 - 145 mmol/L   Potassium 3.6 3.5 - 5.1 mmol/L   Chloride 105 98 - 111 mmol/L   CO2 24 22 - 32 mmol/L   Glucose, Bld 97 70 - 99 mg/dL    Comment: Glucose reference range applies only to samples taken after fasting for at least 8 hours.   BUN 13 8 - 23 mg/dL   Creatinine, Ser 1.46 (H) 0.61 - 1.24 mg/dL   Calcium 8.8 (L) 8.9 - 10.3 mg/dL   Total Protein 6.7 6.5 - 8.1 g/dL   Albumin 3.6 3.5 - 5.0 g/dL   AST 43 (H) 15 - 41 U/L   ALT 25 0 - 44 U/L   Alkaline Phosphatase 91 38 - 126 U/L   Total Bilirubin 2.2 (H) 0.3 - 1.2 mg/dL    GFR, Estimated 47 (L) >60 mL/min    Comment: (NOTE) Calculated using the CKD-EPI Creatinine Equation (2021)    Anion gap 9 5 - 15    Comment: Performed at [facility] [address] [place], [place] [address]  Troponin I (High Sensitivity)     Status: Abnormal   Collection Time: 08/10/22  4:45 PM  Result Value Ref Range   Troponin I (High Sensitivity) 27 (H) <18 ng/L    Comment: (NOTE) Elevated high sensitivity troponin I (hsTnI) values and significant  changes across serial measurements may suggest ACS but many other  chronic and acute conditions  are known to elevate hsTnI results.  Refer to the "Links" section for chest pain algorithms and additional  guidance. Performed at [facility], [place] [address], [place], [place] [address]   CBC with Differential     Status: Abnormal   Collection Time: 08/10/22  4:45 PM  Result Value Ref Range   WBC 4.4 4.0 - 10.5 K/uL   RBC 3.84 (L) 4.22 - 5.81 MIL/uL   Hemoglobin 13.6 13.0 - 17.0 g/dL   HCT 39.3 39.0 - 52.0 %   MCV 102.3 (H) 80.0 - 100.0 fL   MCH 35.4 (H) 26.0 - 34.0 pg   MCHC 34.6 30.0 - 36.0 g/dL   RDW 15.2 11.5 - 15.5 %   Platelets 95 (L) 150 - 400 K/uL    Comment: Immature Platelet Fraction may be clinically indicated, consider ordering this additional test [id number] REPEATED TO VERIFY PLATELET COUNT CONFIRMED BY SMEAR    nRBC 0.0 0.0 - 0.2 %   Neutrophils Relative % 53 %   Neutro Abs 2.4 1.7 - 7.7 K/uL   Lymphocytes Relative 18 %   Lymphs Abs 0.8 0.7 - 4.0 K/uL   Monocytes Relative 12 %   Monocytes Absolute 0.6 0.1 - 1.0 K/uL   Eosinophils Relative 11 %   Eosinophils Absolute 0.5 0.0 - 0.5 K/uL   Basophils Relative 2 %   Basophils Absolute 0.1 0.0 - 0.1 K/uL   Immature Granulocytes 4 %   Abs Immature Granulocytes 0.16 (H) 0.00 - 0.07 K/uL    Comment: Performed at [facility], [place] [address], [place], [place] [address]  Resp panel by RT-PCR (RSV, Flu A&B, Covid) Anterior Nasal Swab     Status:  None   Collection Time: 08/10/22  5:00 PM   Specimen: Anterior Nasal Swab  Result Value Ref Range   SARS Coronavirus 2 by RT PCR NEGATIVE NEGATIVE    Comment: (NOTE) SARS-CoV-2 target nucleic acids are NOT DETECTED.  The SARS-CoV-2 RNA is generally detectable in upper respiratory specimens during the acute phase of infection. The lowest concentration of SARS-CoV-2 viral copies this assay can detect is 138 copies/mL. A negative result does not preclude SARS-Cov-2 infection and should not be used as the sole basis for treatment or other patient management decisions. A negative result may occur with  improper specimen collection/handling, submission of specimen other than nasopharyngeal swab, presence of viral mutation(s) within the areas targeted by this assay, and inadequate number of viral copies(<138 copies/mL). A negative result must be combined with clinical observations, patient history, and epidemiological information. The expected result is Negative.  Fact Sheet for Patients:  EntrepreneurPulse.com.au  Fact Sheet for Healthcare Providers:  IncredibleEmployment.be  This test is no t yet approved or cleared by the [place] FDA and  has been authorized for detection and/or diagnosis of SARS-CoV-2 by FDA under an Emergency Use Authorization (EUA). This EUA will remain  in effect (meaning this test can be used) for the duration of the COVID-19 declaration under Section 564(b)(1) of the Act, 21 U.S.C.section 360bbb-3(b)(1), unless the authorization is terminated  or revoked sooner.       Influenza A by PCR NEGATIVE NEGATIVE   Influenza B by PCR NEGATIVE NEGATIVE    Comment: (NOTE) The Xpert Xpress SARS-CoV-2/FLU/RSV plus assay is intended as an aid in the diagnosis of influenza from Nasopharyngeal swab specimens and should not be used as a sole basis for treatment. Nasal washings and aspirates are unacceptable for Xpert Xpress  SARS-CoV-2/FLU/RSV  testing.  Fact Sheet for Patients: EntrepreneurPulse.com.au  Fact Sheet for Healthcare Providers: IncredibleEmployment.be  This test is not yet approved or cleared by the [place] FDA and has been authorized for detection and/or diagnosis of SARS-CoV-2 by FDA under an Emergency Use Authorization (EUA). This EUA will remain in effect (meaning this test can be used) for the duration of the COVID-19 declaration under Section 564(b)(1) of the Act, 21 U.S.C. section 360bbb-3(b)(1), unless the authorization is terminated or revoked.     Resp Syncytial Virus by PCR NEGATIVE NEGATIVE    Comment: (NOTE) Fact Sheet for Patients: EntrepreneurPulse.com.au  Fact Sheet for Healthcare Providers: IncredibleEmployment.be  This test is not yet approved or cleared by the [place] FDA and has been authorized for detection and/or diagnosis of SARS-CoV-2 by FDA under an Emergency Use Authorization (EUA). This EUA will remain in effect (meaning this test can be used) for the duration of the COVID-19 declaration under Section 564(b)(1) of the Act, 21 U.S.C. section 360bbb-3(b)(1), unless the authorization is terminated or revoked.  Performed at [facility], [place] [address], [place], [place] [address]   Troponin I (High Sensitivity)     Status: Abnormal   Collection Time: 08/10/22 10:26 PM  Result Value Ref Range   Troponin I (High Sensitivity) 36 (H) <18 ng/L    Comment: (NOTE) Elevated high sensitivity troponin I (hsTnI) values and significant  changes across serial measurements may suggest ACS but many other  chronic and acute conditions are known to elevate hsTnI results.  Refer to the "Links" section for chest pain algorithms and additional  guidance. Performed at [facility], [place] [address] [place], [place] [address]   Urinalysis, Routine w reflex microscopic -Urine, Clean  Catch     Status: Abnormal   Collection Time: 08/11/22  1:03 AM  Result Value Ref Range   Color, Urine YELLOW YELLOW   APPearance CLEAR CLEAR   Specific Gravity, Urine 1.030 1.005 - 1.030   pH 6.0 5.0 - 8.0   Glucose, UA >=500 (A) NEGATIVE mg/dL   Hgb urine dipstick SMALL (A) NEGATIVE   Bilirubin Urine NEGATIVE NEGATIVE   Ketones, ur 5 (A) NEGATIVE mg/dL   Protein, ur NEGATIVE NEGATIVE mg/dL   Nitrite NEGATIVE NEGATIVE   Leukocytes,Ua NEGATIVE NEGATIVE   RBC / HPF 0-5 0 - 5 RBC/hpf   WBC, UA 0-5 0 - 5 WBC/hpf   Bacteria, UA NONE SEEN NONE SEEN   Squamous Epithelial / HPF 0-5 0 - 5 /HPF    Comment: Performed at [facility], [place] [address], [place], [place] [address]  CBC     Status: Abnormal   Collection Time: 08/11/22  2:42 AM  Result Value Ref Range   WBC 6.7 4.0 - 10.5 K/uL   RBC 3.90 (L) 4.22 - 5.81 MIL/uL   Hemoglobin 14.0 13.0 - 17.0 g/dL   HCT 40.1 39.0 - 52.0 %   MCV 102.8 (H) 80.0 - 100.0 fL   MCH 35.9 (H) 26.0 - 34.0 pg   MCHC 34.9 30.0 - 36.0 g/dL   RDW 15.2 11.5 - 15.5 %   Platelets 88 (L) 150 - 400 K/uL    Comment: Immature Platelet Fraction may be clinically indicated, consider ordering this additional test [id number] REPEATED TO VERIFY    nRBC 0.0 0.0 - 0.2 %    Comment: Performed at [facility], [place] [address], [place], [place] [address]  Comprehensive metabolic panel     Status:  Abnormal   Collection Time: 08/11/22  2:42 AM  Result Value Ref Range   Sodium 136 135 - 145 mmol/L   Potassium 4.0 3.5 - 5.1 mmol/L   Chloride 105 98 - 111 mmol/L   CO2 22 22 - 32 mmol/L   Glucose, Bld 137 (H) 70 - 99 mg/dL    Comment: Glucose reference range applies only to samples taken after fasting for at least 8 hours.   BUN 13 8 - 23 mg/dL   Creatinine, Ser 1.36 (H) 0.61 - 1.24 mg/dL   Calcium 8.9 8.9 - 10.3 mg/dL   Total Protein 6.6 6.5 - 8.1 g/dL   Albumin 3.5 3.5 - 5.0 g/dL   AST 52 (H) 15 - 41 U/L   ALT 25 0 - 44 U/L   Alkaline Phosphatase 87 38 -  126 U/L   Total Bilirubin 4.1 (H) 0.3 - 1.2 mg/dL   GFR, Estimated 50 (L) >60 mL/min    Comment: (NOTE) Calculated using the CKD-EPI Creatinine Equation (2021)    Anion gap 9 5 - 15    Comment: Performed at [facility] [address], [place], [place] [address]  Magnesium     Status: None   Collection Time: 08/11/22  2:42 AM  Result Value Ref Range   Magnesium 1.8 1.7 - 2.4 mg/dL    Comment: Performed at [facility] [address] [place], [place] [address]  Phosphorus     Status: None   Collection Time: 08/11/22  2:42 AM  Result Value Ref Range   Phosphorus 3.0 2.5 - 4.6 mg/dL    Comment: Performed at [facility] [address], [place], [place] [address]   DG Chest Portable 1 View  Result Date: 08/11/2022 CLINICAL DATA:  Fall with chest compression EXAM: PORTABLE CHEST 1 VIEW COMPARISON:  Yesterday FINDINGS: Stable heart size and mediastinal contours. Mild interstitial coarsening at the bases. There is no edema, consolidation, effusion, or pneumothorax. IMPRESSION: No acute or interval finding. Electronically Signed   By: Jorje Guild M.D.   On: 08/11/2022 06:20   CT Head Wo Contrast  Result Date: 08/10/2022 CLINICAL DATA:  Fall, hit head on floor, hematoma to back of head EXAM: CT HEAD WITHOUT CONTRAST CT CERVICAL SPINE WITHOUT CONTRAST TECHNIQUE: Multidetector CT imaging of the head and cervical spine was performed following the standard protocol without intravenous contrast. Multiplanar CT image reconstructions of the cervical spine were also generated. RADIATION DOSE REDUCTION: This exam was performed according to the departmental dose-optimization program which includes automated exposure control, adjustment of the mA and/or kV according to patient size and/or use of iterative reconstruction technique. COMPARISON:  None Available. FINDINGS: CT HEAD FINDINGS Brain: No evidence of acute infarction, hemorrhage, hydrocephalus, extra-axial collection or mass  lesion/mass effect. Periventricular and deep white matter hypodensity. Vascular: No hyperdense vessel or unexpected calcification. Skull: Normal. Negative for fracture or focal lesion. Sinuses/Orbits: No acute finding. Other: Soft tissue contusion of the scalp vertex (series 4, image 8). CT CERVICAL SPINE FINDINGS Alignment: Normal. Skull base and vertebrae: No acute fracture. No primary bone lesion or focal pathologic process. Soft tissues and spinal canal: No prevertebral fluid or swelling. No visible canal hematoma. Disc levels: Focally moderate disc space height loss and osteophytosis of C5-C7 with otherwise preserved disc spaces. Upper chest: Negative. Other: None. IMPRESSION: 1. No acute intracranial pathology. Small-vessel white matter disease. 2. Soft tissue contusion of the scalp vertex. 3. No fracture or static subluxation of the  cervical spine. Electronically Signed   By: Delanna Ahmadi M.D.   On: 08/10/2022 19:20   CT Cervical Spine Wo Contrast  Result Date: 08/10/2022 CLINICAL DATA:  Fall, hit head on floor, hematoma to back of head EXAM: CT HEAD WITHOUT CONTRAST CT CERVICAL SPINE WITHOUT CONTRAST TECHNIQUE: Multidetector CT imaging of the head and cervical spine was performed following the standard protocol without intravenous contrast. Multiplanar CT image reconstructions of the cervical spine were also generated. RADIATION DOSE REDUCTION: This exam was performed according to the departmental dose-optimization program which includes automated exposure control, adjustment of the mA and/or kV according to patient size and/or use of iterative reconstruction technique. COMPARISON:  None Available. FINDINGS: CT HEAD FINDINGS Brain: No evidence of acute infarction, hemorrhage, hydrocephalus, extra-axial collection or mass lesion/mass effect. Periventricular and deep white matter hypodensity. Vascular: No hyperdense vessel or unexpected calcification. Skull: Normal. Negative for fracture or focal lesion.  Sinuses/Orbits: No acute finding. Other: Soft tissue contusion of the scalp vertex (series 4, image 8). CT CERVICAL SPINE FINDINGS Alignment: Normal. Skull base and vertebrae: No acute fracture. No primary bone lesion or focal pathologic process. Soft tissues and spinal canal: No prevertebral fluid or swelling. No visible canal hematoma. Disc levels: Focally moderate disc space height loss and osteophytosis of C5-C7 with otherwise preserved disc spaces. Upper chest: Negative. Other: None. IMPRESSION: 1. No acute intracranial pathology. Small-vessel white matter disease. 2. Soft tissue contusion of the scalp vertex. 3. No fracture or static subluxation of the cervical spine. Electronically Signed   By: Delanna Ahmadi M.D.   On: 08/10/2022 19:20   CT CHEST ABDOMEN PELVIS W CONTRAST  Result Date: 08/10/2022 CLINICAL DATA:  Fall, syncope, hit head, complains of thoracic back pain to palpation, history of prostate cancer * Tracking Code: BO * EXAM: CT CHEST, ABDOMEN, AND PELVIS WITH CONTRAST CT THORACIC AND LUMBAR SPINE WITH CONTRAST TECHNIQUE: Multidetector CT imaging of the chest, abdomen and pelvis was performed following the standard protocol during bolus administration of intravenous contrast. Multidetector CT imaging of the thoracic and lumbar spine was performed following the standard protocol during bolus administration of intravenous contrast. RADIATION DOSE REDUCTION: This exam was performed according to the departmental dose-optimization program which includes automated exposure control, adjustment of the mA and/or kV according to patient size and/or use of iterative reconstruction technique. CONTRAST:  48m OMNIPAQUE IOHEXOL 350 MG/ML SOLN COMPARISON:  CT abdomen pelvis, 10/18/2020 FINDINGS: CT CHEST FINDINGS Cardiovascular: Aortic atherosclerosis. Dense aortic valve calcifications. Normal heart size. Three-vessel coronary artery calcifications. Very dense mitral annulus calcifications. No pericardial  effusion. Mediastinum/Nodes: No enlarged mediastinal, hilar, or axillary lymph nodes. Small hiatal hernia. Thyroid gland, trachea, and esophagus demonstrate no significant findings. Lungs/Pleura: Mild paraseptal emphysema. Diffuse bilateral bronchial wall thickening. Dependent bibasilar scarring and or atelectasis. No pleural effusion or pneumothorax. Musculoskeletal: No chest wall mass or suspicious osseous lesions identified. CT ABDOMEN PELVIS FINDINGS Hepatobiliary: No solid liver abnormality is seen. Small benign left lobe liver cysts, for which no further follow-up or characterization is required. Coarse nodular cirrhotic morphology of the liver. Status post cholecystectomy. No biliary ductal dilatation. Pancreas: Unremarkable. No pancreatic ductal dilatation or surrounding inflammatory changes. Spleen: Splenomegaly, maximum coronal span 15.0 cm. Adrenals/Urinary Tract: Adrenal glands are unremarkable. Severely atrophic right kidney with a small nonobstructive calculus of the midportion. Left kidney is normal. No ureteral calculi or hydronephrosis. Bladder is unremarkable. Stomach/Bowel: Stomach is within normal limits. Appendix appears normal. No evidence of bowel wall thickening, distention, or inflammatory changes. Descending and  sigmoid diverticulosis. Vascular/Lymphatic: Aortic atherosclerosis. 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 No enlarged abdominal or pelvic lymph nodes. Reproductive: Status post prostatectomy. Other: Small fat containing bilateral inguinal hernias.  No ascites. Musculoskeletal: No acute osseous findings. CT THORACIC AND LUMBAR SPINE FINDINGS Alignment: Gentle dextroscoliosis of the thoracic spine, apex T6, with otherwise normal thoracic kyphosis. Normal lumbar lordosis. Vertebral bodies: Osteopenia. Probable very subtle superior endplate deformity of L1 with no significant anterior height loss (series 6, image 34). Unchanged, chronic  Schmorl deformity of the superior endplate of L3 (series 6, image 41). No fracture or dislocation. Disc spaces: Mild multilevel disc space height loss and osteophytosis throughout the thoracic and lumbar spine, with bridging osteophytosis of the mid to lower thoracic spine in keeping with DISH. Paraspinous soft tissues: Unremarkable. IMPRESSION: 1. No CT evidence of acute traumatic injury to the chest, abdomen, or pelvis. 2. Probable very subtle superior endplate deformity of L1 with no significant anterior height loss. Correlate for acute point tenderness. 3. Osteopenia. 4. Coronary artery disease. 5. Dense aortic valve calcifications. Correlate for echocardiographic evidence of aortic valve dysfunction. 6. Cirrhosis and splenomegaly. 7. Severely atrophic right kidney with a small nonobstructive calculus of the midportion. 8. Descending and sigmoid diverticulosis without evidence of acute diverticulitis. 9. Status post prostatectomy. No evidence of lymphadenopathy or metastatic disease in the abdomen or pelvis. 10. Aortic atherosclerosis. 11. Emphysema. Aortic Atherosclerosis (ICD10-I70.0) and Emphysema (ICD10-J43.9). Electronically Signed   By: Delanna Ahmadi M.D.   On: 08/10/2022 19:16   CT L-SPINE NO CHARGE  Result Date: 08/10/2022 CLINICAL DATA:  Fall, syncope, hit head, complains of thoracic back pain to palpation, history of prostate cancer * Tracking Code: BO * EXAM: CT CHEST, ABDOMEN, AND PELVIS WITH CONTRAST CT THORACIC AND LUMBAR SPINE WITH CONTRAST TECHNIQUE: Multidetector CT imaging of the chest, abdomen and pelvis was performed following the standard protocol during bolus administration of intravenous contrast. Multidetector CT imaging of the thoracic and lumbar spine was performed following the standard protocol during bolus administration of intravenous contrast. RADIATION DOSE REDUCTION: This exam was performed according to the departmental dose-optimization program which includes automated  exposure control, adjustment of the mA and/or kV according to patient size and/or use of iterative reconstruction technique. CONTRAST:  27m OMNIPAQUE IOHEXOL 350 MG/ML SOLN COMPARISON:  CT abdomen pelvis, 10/18/2020 FINDINGS: CT CHEST FINDINGS Cardiovascular: Aortic atherosclerosis. Dense aortic valve calcifications. Normal heart size. Three-vessel coronary artery calcifications. Very dense mitral annulus calcifications. No pericardial effusion. Mediastinum/Nodes: No enlarged mediastinal, hilar, or axillary lymph nodes. Small hiatal hernia. Thyroid gland, trachea, and esophagus demonstrate no significant findings. Lungs/Pleura: Mild paraseptal emphysema. Diffuse bilateral bronchial wall thickening. Dependent bibasilar scarring and or atelectasis. No pleural effusion or pneumothorax. Musculoskeletal: No chest wall mass or suspicious osseous lesions identified. CT ABDOMEN PELVIS FINDINGS Hepatobiliary: No solid liver abnormality is seen. Small benign left lobe liver cysts, for which no further follow-up or characterization is required. Coarse nodular cirrhotic morphology of the liver. Status post cholecystectomy. No biliary ductal dilatation. Pancreas: Unremarkable. No pancreatic ductal dilatation or surrounding inflammatory changes. Spleen: Splenomegaly, maximum coronal span 15.0 cm. Adrenals/Urinary Tract: Adrenal glands are unremarkable. Severely atrophic right kidney with a small nonobstructive calculus of the midportion. Left kidney is normal. No ureteral calculi or hydronephrosis. Bladder is unremarkable. Stomach/Bowel: Stomach is within normal limits. Appendix appears normal. No evidence of bowel wall thickening, distention, or inflammatory changes. Descending and sigmoid diverticulosis. Vascular/Lymphatic: Aortic atherosclerosis. 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 No enlarged abdominal or pelvic lymph nodes. Reproductive: Status post prostatectomy. Other:  Small fat containing bilateral inguinal hernias.  No ascites. Musculoskeletal: No acute osseous findings. CT THORACIC AND LUMBAR SPINE FINDINGS Alignment: Gentle dextroscoliosis of the thoracic spine, apex T6, with otherwise normal thoracic kyphosis. Normal lumbar lordosis. Vertebral bodies: Osteopenia. Probable very subtle superior endplate deformity of L1 with no significant anterior height loss (series 6, image 34). Unchanged, chronic Schmorl deformity of the superior endplate of L3 (series 6, image 41). No fracture or dislocation. Disc spaces: Mild multilevel disc space height loss and osteophytosis throughout the thoracic and lumbar spine, with bridging osteophytosis of the mid to lower thoracic spine in keeping with DISH. Paraspinous soft tissues: Unremarkable. IMPRESSION: 1. No CT evidence of acute traumatic injury to the chest, abdomen, or pelvis. 2. Probable very subtle superior endplate deformity of L1 with no significant anterior height loss. Correlate for acute point tenderness. 3. Osteopenia. 4. Coronary artery disease. 5. Dense aortic valve calcifications. Correlate for echocardiographic evidence of aortic valve dysfunction. 6. Cirrhosis and splenomegaly. 7. Severely atrophic right kidney with a small nonobstructive calculus of the midportion. 8. Descending and sigmoid diverticulosis without evidence of acute diverticulitis. 9. Status post prostatectomy. No evidence of lymphadenopathy or metastatic disease in the abdomen or pelvis. 10. Aortic atherosclerosis. 11. Emphysema. Aortic Atherosclerosis (ICD10-I70.0) and Emphysema (ICD10-J43.9). Electronically Signed   By: Delanna Ahmadi M.D.   On: 08/10/2022 19:16   CT T-SPINE NO CHARGE  Result Date: 08/10/2022 CLINICAL DATA:  Fall, syncope, hit head, complains of thoracic back pain to palpation, history of prostate cancer * Tracking Code: BO * EXAM: CT CHEST, ABDOMEN, AND PELVIS WITH CONTRAST CT THORACIC AND LUMBAR SPINE WITH CONTRAST TECHNIQUE:  Multidetector CT imaging of the chest, abdomen and pelvis was performed following the standard protocol during bolus administration of intravenous contrast. Multidetector CT imaging of the thoracic and lumbar spine was performed following the standard protocol during bolus administration of intravenous contrast. RADIATION DOSE REDUCTION: This exam was performed according to the departmental dose-optimization program which includes automated exposure control, adjustment of the mA and/or kV according to patient size and/or use of iterative reconstruction technique. CONTRAST:  28m OMNIPAQUE IOHEXOL 350 MG/ML SOLN COMPARISON:  CT abdomen pelvis, 10/18/2020 FINDINGS: CT CHEST FINDINGS Cardiovascular: Aortic atherosclerosis. Dense aortic valve calcifications. Normal heart size. Three-vessel coronary artery calcifications. Very dense mitral annulus calcifications. No pericardial effusion. Mediastinum/Nodes: No enlarged mediastinal, hilar, or axillary lymph nodes. Small hiatal hernia. Thyroid gland, trachea, and esophagus demonstrate no significant findings. Lungs/Pleura: Mild paraseptal emphysema. Diffuse bilateral bronchial wall thickening. Dependent bibasilar scarring and or atelectasis. No pleural effusion or pneumothorax. Musculoskeletal: No chest wall mass or suspicious osseous lesions identified. CT ABDOMEN PELVIS FINDINGS Hepatobiliary: No solid liver abnormality is seen. Small benign left lobe liver cysts, for which no further follow-up or characterization is required. Coarse nodular cirrhotic morphology of the liver. Status post cholecystectomy. No biliary ductal dilatation. Pancreas: Unremarkable. No pancreatic ductal dilatation or surrounding inflammatory changes. Spleen: Splenomegaly, maximum coronal span 15.0 cm. Adrenals/Urinary Tract: Adrenal glands are unremarkable. Severely atrophic right kidney with a small nonobstructive calculus of the midportion. Left kidney is normal. No ureteral calculi or  hydronephrosis. Bladder is unremarkable. Stomach/Bowel: Stomach is within normal limits. Appendix appears normal. No evidence of bowel wall thickening, distention, or inflammatory changes. Descending and sigmoid diverticulosis. Vascular/Lymphatic: Aortic atherosclerosis. 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 No enlarged abdominal or pelvic lymph nodes. Reproductive:  Status post prostatectomy. Other: Small fat containing bilateral inguinal hernias.  No ascites. Musculoskeletal: No acute osseous findings. CT THORACIC AND LUMBAR SPINE FINDINGS Alignment: Gentle dextroscoliosis of the thoracic spine, apex T6, with otherwise normal thoracic kyphosis. Normal lumbar lordosis. Vertebral bodies: Osteopenia. Probable very subtle superior endplate deformity of L1 with no significant anterior height loss (series 6, image 34). Unchanged, chronic Schmorl deformity of the superior endplate of L3 (series 6, image 41). No fracture or dislocation. Disc spaces: Mild multilevel disc space height loss and osteophytosis throughout the thoracic and lumbar spine, with bridging osteophytosis of the mid to lower thoracic spine in keeping with DISH. Paraspinous soft tissues: Unremarkable. IMPRESSION: 1. No CT evidence of acute traumatic injury to the chest, abdomen, or pelvis. 2. Probable very subtle superior endplate deformity of L1 with no significant anterior height loss. Correlate for acute point tenderness. 3. Osteopenia. 4. Coronary artery disease. 5. Dense aortic valve calcifications. Correlate for echocardiographic evidence of aortic valve dysfunction. 6. Cirrhosis and splenomegaly. 7. Severely atrophic right kidney with a small nonobstructive calculus of the midportion. 8. Descending and sigmoid diverticulosis without evidence of acute diverticulitis. 9. Status post prostatectomy. No evidence of lymphadenopathy or metastatic disease in the abdomen or pelvis. 10. Aortic atherosclerosis.  11. Emphysema. Aortic Atherosclerosis (ICD10-I70.0) and Emphysema (ICD10-J43.9). Electronically Signed   By: Delanna Ahmadi M.D.   On: 08/10/2022 19:16   DG Pelvis Portable  Result Date: 08/10/2022 CLINICAL DATA:  Fall, right-sided hip pain EXAM: PORTABLE PELVIS 1-2 VIEWS COMPARISON:  None Available. FINDINGS: Osteopenia. There is no evidence of displaced pelvic fracture or diastasis. No pelvic bone lesions are seen. IMPRESSION: Osteopenia. No displaced fracture of the pelvis or bilateral proximal femurs seen in single frontal view. Please note that plain radiographs are significantly insensitive for hip and pelvic fracture. Recommend CT or MRI to more sensitively evaluate if there is high clinical suspicion for fracture. Electronically Signed   By: Delanna Ahmadi M.D.   On: 08/10/2022 17:11   DG Chest Portable 1 View  Result Date: 08/10/2022 CLINICAL DATA:  Back and right-sided chest pain following recent fall, initial encounter EXAM: PORTABLE CHEST 1 VIEW COMPARISON:  11/10/2020 FINDINGS: The heart size and mediastinal contours are within normal limits. Both lungs are clear. Bony structures show a vague lucency in the superior aspect of the scapula. An undisplaced fracture cannot be totally excluded given the patient's clinical history. IMPRESSION: No focal infiltrate noted. Changes suspicious for right scapular fracture. Correlate to point tenderness. Cross-sectional imaging may be helpful as clinically indicated. Electronically Signed   By: Inez Catalina M.D.   On: 08/10/2022 17:10    Pending Labs Unresulted Labs (From admission, onward)    None       Vitals/Pain Today's Vitals   08/11/22 1001 08/11/22 1015 08/11/22 1100 08/11/22 1202  BP:  (!) 152/67 132/66   Pulse:  81 78   Resp:  15 15   Temp: 98.4 F (36.9 C)     TempSrc: Oral     SpO2:  96% 93%   Weight:      Height:      PainSc:    Asleep    Isolation Precautions Airborne and Contact precautions  Medications Medications   lactated ringers infusion ( Intravenous Infusion Verify 08/11/22 0701)  ezetimibe (ZETIA) tablet 10 mg (has no administration in time range)  ipratropium (ATROVENT) 0.03 % nasal spray 2 spray (has no administration in time range)  thyroid (ARMOUR) tablet 120 mg (has no administration in  time range)  acetaminophen (TYLENOL) tablet 650 mg (650 mg Oral Given 08/11/22 0801)  polyethylene glycol (MIRALAX / GLYCOLAX) packet 17 g (has no administration in time range)  prochlorperazine (COMPAZINE) injection 5 mg (has no administration in time range)  iohexol (OMNIPAQUE) 350 MG/ML injection 75 mL (75 mLs Intravenous Contrast Given 08/10/22 1850)  HYDROmorphone (DILAUDID) injection 0.5 mg (0.5 mg Intravenous Given 08/11/22 0703)    Mobility Have not seen him ambulate     Focused Assessments Cardiac Assessment Handoff:  Cardiac Rhythm: Normal sinus rhythm Lab Results  Component Value Date   CKTOTAL 79 03/24/2015   CKMB 1.7 01/13/2007   TROPONINI <0.03 03/24/2015   Lab Results  Component Value Date   DDIMER 1.13 (H) 09/09/2020   Does the Patient currently have chest pain? No    R Recommendations: See Admitting Provider Note  Report given to:   Additional Notes:

## 2022-08-11 NOTE — ED Notes (Signed)
Pt's only complaints at this time is "back pain that he normally has". Pt still appears to be confused.

## 2022-08-11 NOTE — Consult Note (Addendum)
ELECTROPHYSIOLOGY CONSULT NOTE    Patient ID: [name] MRN: [id number], DOB/AGE: [birth year] 89 y.o.  Admit date: 08/10/2022 Date of Consult: 08/11/2022  Primary Physician: [name], MD Primary Cardiologist: Peter Martinique, MD  Electrophysiologist: New   Referring Provider: Dr. Martinique  Patient Profile: [name] is a 89 y.o. male with a history of CAD s/p stenting of RCA 2017, Prostate CA s/p radical prostatectomy 1998, HTN, HLD, h/o carotid artery disease s/p L CEA 1998, PAF (no longer on [place]), GI bleeding, chronic balance issues, and TIA who is being seen today for the evaluation of second degree AV block at the request of Dr. Martinique.  HPI:  [name] is a 89 y.o. male with medical history as above.   Pt presented to [facility] via EMS overnight with a fall and syncope at home. Second episode in the last 2 days. Pt collapsed Sunday after climbing the stairs into his house, but denies full loss of consciousness. He denies symptoms of CP, SOB, or severe lightheadedness/dizziness.    Yesterday pt was standing in his kitchen when he felt the the room "rising around him". He realized he was falling, but lost consciousness prior to hitting the floor. His spouse found him lying on his back with bruising to his head. Per spouse he was "out" for 2-3 minutes. He had severe back pain on awakening and EMS was called.   This am, in the ED he again became unresponsive with agonal breathing. It was initially reported he was not connected to telemetry at the time, but now that it has loaded shows approx 30s of ventricular standstill. He did not initially respond to sternal rub, but as CPR was initiated (1 chest compression), he became responsive with positive color change. Upon being connected he was noted to have intermittent second degree AV block and occasional PVCs.  Cardiology was consulted for evaluation of syncope and possible PEA arrest, EP was subsequently consulted with noted 2nd  degree AV block  Updated echo pending. Echo 01/2022 LVEF 60-65%, no WMA, moderate LVH, Normal RV.   Pt in room currently, primary complaint is back pain. Denies syncope prior to these episodes this weekend. Denies chest pain, SOB, or lightheadedness or dizziness. No prodromes with his syncopal spells.   Labs Potassium4.0 (01/30 0242) Magnesium  1.8 (01/30 0242) Creatinine, ser  1.36* (01/30 0242) PLT  88* (01/30 0242) HGB  14.0 (01/30 0242) WBC 6.7 (01/30 0242) HS trop 27 -> 36.    Resp panel negative  Prior Cath 09/2015 with RCA stenting of DES  CT head without acute findings CT spine without fracture CT Chest Abdomen Pelvis with no evidence of acute traumatic injury to the chest, abdomen, or pelvis. Very subtle endplate deformity of L1. Dense aortic valve calcifications noted.      Medications Prior to Admission  Medication Sig Dispense Refill Last Dose   aspirin EC 81 MG tablet Take 81 mg by mouth daily.   08/10/2022   FARXIGA 5 MG TABS tablet TAKE 1 TABLET BY MOUTH EVERY DAY BEFORE BREAKFAST (Patient taking differently: Take 5 mg by mouth daily.) 30 tablet 3 08/10/2022   ipratropium (ATROVENT) 0.03 % nasal spray Place 2 sprays into the nose 2 (two) times daily. (Patient taking differently: Place 2 sprays into the nose 2 (two) times daily as needed for rhinitis.) 60 mL 3 Unk   nitroGLYCERIN (NITROLINGUAL) 0.4 MG/SPRAY spray Place 1 spray under the tongue every 5 (five) minutes x 3 doses  as needed for chest pain. 12 g 5 Unk   thyroid (ARMOUR THYROID) 120 MG tablet Take 1 tablet (120 mg total) by mouth daily. 90 tablet 3 08/10/2022   blood glucose meter kit and supplies KIT Dispense based on patient and insurance preference. Use up to four times daily as directed. 1 each 0     Inpatient Medications:   thyroid  120 mg Oral Daily    Allergies:  Allergies  Allergen Reactions   Ciprofloxacin Other (See Comments)    Hallucinations or jitteriness   Codeine Other (See Comments)     Hallucinations    Flexeril [Cyclobenzaprine] Other (See Comments)    "Made me feel goofy"   Imdur [Isosorbide Nitrate] Other (See Comments)    Caused headaches   Methocarbamol Other (See Comments)    "Made me feel goofy"   Other Other (See Comments)    CANNOT HAVE ANY FOODS WITH SEEDS    Strawberry Extract Other (See Comments)    CANNOT HAVE ANY FOODS WITH SEEDS   Sulfa Antibiotics Other (See Comments)    Chills and shaking- "serum sickness"    Sulfamethoxazole Other (See Comments)    Chills and shaking- "serum sickness"   Sulfonamide Derivatives Other (See Comments)    Chills and shaking "serum sickness"   Tomato Other (See Comments)    CANNOT HAVE ANY FOODS WITH SEEDS   Flagyl [Metronidazole] Rash    Family History  Problem Relation Age of Onset   Heart disease Mother    Heart disease Father    Kidney failure Father    Heart disease Sister    Heart attack Sister    Dementia Sister    Sjogren's syndrome Child    Hashimoto's thyroiditis Child    CAD Child      Physical Exam: Vitals:   08/11/22 1015 08/11/22 1100 08/11/22 1200 08/11/22 1300  BP: (!) 152/67 132/66 122/63 (!) 159/80  Pulse: 81 78 76 76  Resp: '15 15 15 18  '$ Temp:      TempSrc:      SpO2: 96% 93% 92% 95%  Weight:      Height:        GEN- NAD, A&O x 3, normal affect HEENT: Normocephalic, atraumatic Lungs- CTAB, Normal effort.  Heart- Regular rate and rhythm, at least 2/6 AS murmur.  GI- Soft, NT, ND.  Extremities- No clubbing, cyanosis, or edema   EKG: Baseline EKG on arrival shows NSR at 80 bpm with RBBB (134 ms) (personally reviewed)  TELEMETRY: NSR 70-80s with a 30 second episode of ventricular standstill around the time of his syncope. Intermittent second degree HB and PVCs since.   Assessment/Plan: 1.  Intermittent second degree AV block 2. Ventricular Standstill/ Asystole 3. Syncope With 30 second pause on tele which then changed to NSR with intermittent second degree AV block on  initiation of code.  Explained risks, benefits, and alternatives to PPM implantation, including but not limited to bleeding, infection, pneumothorax, pericardial effusion, lead dislodgement, heart attack, stroke, or death.  Pt verbalized understanding and agrees to proceed if indicated.  3. Mild/Moderate AS By echo 01/2022. Repeat pending.  Dense Aortic calcifications on CT.    4. Paroxysmal AF Has been off [place] due to GI bleeds Burden has been low per records.  CHA2DS2/VASc is at least 7.    5. CAD s/p RCA DES 2017 Denies s/s ischemia  Plan for pacemaker tomorrow.   For questions or updates, please contact [facility] Please consult [email or web address] for  contact info under Cardiology/STEMI.  Signed, Shirley Friar, PA-C  08/11/2022 2:21 PM   I have seen and examined this patient with Oda Kilts.  Agree with above, note added to reflect my findings.  He has a history of coronary artery disease, aortic stenosis who presented to the hospital with episodes of syncope.  While in the emergency room, he had a prolonged episode of syncope.  Telemetry showed ventricular standstill of approximately 30 seconds.  He got 1 chest compression and conduction improved.  No reversible causes found.  GEN: Well nourished, well developed, in no acute distress  HEENT: normal  Neck: no JVD, carotid bruits, or masses Cardiac: RRR; no murmurs, rubs, or gallops,no edema  Respiratory:  clear to auscultation bilaterally, normal work of breathing GI: soft, nontender, nondistended, + BS MS: no deformity or atrophy  Skin: warm and dry Neuro:  Strength and sensation are intact Psych: euthymic mood, full affect   Intermittent complete heart block: No obvious reversible cause.  Patient Akito Boomhower need pacemaker implant.  Risk and benefits have been discussed.  Patient understands the risks and is agreed to the procedure.  Ramonita Koenig keep him n.p.o. tonight after midnight for possible pacemaker implant tomorrow.   Should he have more prolonged pauses, he Yue Glasheen require temporary pacing wire.  Dequann Vandervelden M. Orva Riles MD 08/11/2022 4:40 PM

## 2022-08-11 NOTE — H&P (View-Only) (Signed)
S: Paged by staff RN reporting patient had 2nd episode of ventricular standstill lasting >18 seconds. He was confused and not responding, episodes resolved spontaneously, no CODE blue was called.  O: Patient assessed at the bedside, sleepy, open eyes to voice, very hard of hearing, denied complaints. He is mildly confused, not combative. Cardiac S1S2, no murmur. Skin warm, distal pulse present. HR at 80s, BP 130/60, pox 91% RA. Complete heart block lasting 18 second noted on telemetry, currently in sinus rhythm. Wife is at bedside, states she is the decision maker if patient unable to make decisions for himself. She confirms patient would be full code. She agrees that if temporary wire is indicated and she wishes the patient to receive it. Risk versus benefit reviewed.   Plan: Intermittent complete heart block  Ventricular Standstill/ Asystole  - Cath lab staff and on call Dr Claiborne Billings notified, he is in active case and Dr Einar Gip has kindly agreed with assisting temp wire placement.  - Patient placed on zoll, advised staff transfer patient to ICU 2H.

## 2022-08-11 NOTE — Consult Note (Signed)
Cardiology Consultation   Patient ID: [name] MRN: [id number]; DOB: [birth year]  Admit date: 08/10/2022 Date of Consult: 08/11/2022  PCP:  [name], MD   [place] Providers Cardiologist:  Peter Martinique, MD        Patient Profile:   [name] is a 89 y.o. male with a hx of CAD, prostate CA s/p radical prostatectomy 1998, HTN, HLD, h/o carotid artery disease s/p L CEA 1998, paroxysmal afib (no longer on anticoagulation), chronic balance issues, and TIA who is being seen 08/11/2022 for the evaluation of syncope, possible PEA arrest at the request of Dr. Nevada Crane.  History of Present Illness:   [name] presented to the hospital on 1/29 with EMS after a fall with syncope at home. This was actually the second fall episode in the last 2 days. Per patient, he collapsed with acute weakness on Sunday after climbing the stairs to his house. He denies full loss of consciousness. He also denies any symptoms prior to passing out including chest pain, shortness of breath, severe lightheadedness/dizziness. Patient endorses chronic sensation of unsteadiness/inadequate balance for several years, denies changes from baseline. Yesterday patient was standing in his kitchen, watching birds out of his window when he suddenly had the sensation that the room was rising around him. He says that he realized that he was actually falling but lost consciousness prior to hitting the floor. Patient's spouse found him laying on his back with bruising on the back of his head. Per spouse, patient was "out cold" for 2-3 minutes. Upon waking up, he had severe back pain/spasms and was not able to move. This prompted them to call EMS. Patient again does not recall any prodromal symptoms prior to falling/losing consciousness. Prior to Sunday, he denies any recent change to his medical status and says that he's been feeling well. Trauma CT imaging in the ED was unremarkable for acute injury or bleeding in  head/abdomen/pelvis.  Initial lab work notable for slightly elevated troponin, 27, 36.  Platelets found low at 88.  This morning, patient was found unresponsive with agonal breathing by emergency department staff.  Patient reportedly did not respond to a sternal rub and CODE BLUE was called.  Interestingly, following a single chest compression, patient became responsive with positive color change.  Cardiology was consulted for evaluation given syncope and possible PEA cardiac arrest.    Past Medical History:  Diagnosis Date   Anginal pain ([place])    Anxiety    " OCCASIONAL"   Arthritis    Asthma due to seasonal allergies    uses inhalers prn   Carotid artery occlusion    Left   Complication of anesthesia    " had tremors after prostate surgery   Coronary artery disease    Diverticulitis    recurrent   Family history of anesthesia complication    MOTHER    GERD (gastroesophageal reflux disease)    H/O hiatal hernia    Hearing aid worn    Hyperlipemia    Hypertension    Kidney stone    lithotripsy 4627 w complications, req stents, Dr Rosana Hoes   Prostate CA [facility])    Shortness of breath    Thyroid disease    TIA (transient ischemic attack)    Tinnitus     Past Surgical History:  Procedure Laterality Date   CARDIAC CATHETERIZATION  2008   minimal dz, Dr Cathie Olden   CARDIAC CATHETERIZATION N/A 10/09/2015   Procedure: Left Heart Cath  and Coronary Angiography;  Surgeon: Peter M Martinique, MD;  Location: [facility];  Service: Cardiovascular;  Laterality: N/A;   CARDIAC CATHETERIZATION N/A 10/09/2015   Procedure: Intravascular Pressure Wire/FFR Study;  Surgeon: Peter M Martinique, MD;  Location: [place] [facility];  Service: Cardiovascular;  Laterality: N/A;   CARDIAC CATHETERIZATION N/A 10/09/2015   Procedure: Coronary Stent Intervention;  Surgeon: Peter M Martinique, MD;  Location: [facility];  Service: Cardiovascular;  Laterality: N/A;   CAROTID ENDARTERECTOMY  Left   1998    CHOLECYSTECTOMY     CORONARY STENT PLACEMENT  10/09/2015   DES to RCA   ESOPHAGOGASTRODUODENOSCOPY (EGD) WITH PROPOFOL Left 03/26/2017   Procedure: ESOPHAGOGASTRODUODENOSCOPY (EGD) WITH PROPOFOL;  Surgeon: Arta Silence, MD;  Location: [facility] ENDOS[facility];  Service: Endoscopy;  Laterality: Left;   LEFT HEART CATHETERIZATION WITH CORONARY ANGIOGRAM N/A 09/15/2013   Procedure: LEFT HEART CATHETERIZATION WITH CORONARY ANGIOGRAM;  Surgeon: Peter M Martinique, MD;  Location[facility] CATH LAB;  Service: Cardiovascular;  Laterality: N/A;   PROSTATECTOMY  1998   radical for prostate cancer     Home Medications:  Prior to Admission medications   Medication Sig Start Date End Date Taking? Authorizing Provider  aspirin EC 81 MG tablet Take 81 mg by mouth daily.   Yes [provider]  FARXIGA 5 MG TABS tablet TAKE 1 TABLET BY MOUTH EVERY DAY BEFORE BREAKFAST Patient taking differently: Take 5 mg by mouth daily. 05/11/22  Yes Copland, Gay Filler, MD  ipratropium (ATROVENT) 0.03 % nasal spray Place 2 sprays into the nose 2 (two) times daily. Patient taking differently: Place 2 sprays into the nose 2 (two) times daily as needed for rhinitis. 03/20/22  Yes Copland, Gay Filler, MD  nitroGLYCERIN (NITROLINGUAL) 0.4 MG/SPRAY spray Place 1 spray under the tongue every 5 (five) minutes x 3 doses as needed for chest pain. 07/18/18  Yes Martinique, Peter M, MD  thyroid [facility] THYROID) 120 MG tablet Take 1 tablet (120 mg total) by mouth daily. 10/13/21  Yes Copland, Gay Filler, MD  blood glucose meter kit and supplies KIT Dispense based on patient and insurance preference. Use up to four times daily as directed. 09/10/20   Geradine Girt, DO    Inpatient Medications: Scheduled Meds:  ezetimibe  10 mg Oral Daily   ipratropium  2 spray Nasal BID   thyroid  120 mg Oral Daily   Continuous Infusions:  lactated ringers 50 mL/hr at 08/11/22 0701   PRN Meds: acetaminophen, polyethylene glycol, prochlorperazine  Allergies:     Allergies  Allergen Reactions   Ciprofloxacin Other (See Comments)    Hallucinations or jitteriness   Codeine Other (See Comments)    Hallucinations    Flexeril [Cyclobenzaprine] Other (See Comments)    "Made me feel goofy"   Imdur [Isosorbide Nitrate] Other (See Comments)    Caused headaches   Methocarbamol Other (See Comments)    "Made me feel goofy"   Other Other (See Comments)    CANNOT HAVE ANY FOODS WITH SEEDS    Strawberry Extract Other (See Comments)    CANNOT HAVE ANY FOODS WITH SEEDS   Sulfa Antibiotics Other (See Comments)    Chills and shaking- "serum sickness"    Sulfamethoxazole Other (See Comments)    Chills and shaking- "serum sickness"   Sulfonamide Derivatives Other (See Comments)    Chills and shaking "serum sickness"   Tomato Other (See Comments)    CANNOT HAVE ANY FOODS WITH SEEDS   Flagyl [  Metronidazole] Rash    Social History:   Social History   Socioeconomic History   Marital status: Married    Spouse name: Not on file   Number of children: Not on file   Years of education: Not on file   Highest education level: Not on file  Occupational History   Occupation: retired    Comment: Actor [employer]  Tobacco Use   Smoking status: Former    Packs/day: 2.50    Years: 30.00    Total pack years: 75.00    Types: Cigarettes    Quit date: 08/28/1985    Years since quitting: 36.9   Smokeless tobacco: Never  Substance and Sexual Activity   Alcohol use: Yes    Alcohol/week: 2.0 standard drinks of alcohol    Types: 2 Standard drinks or equivalent per week    Comment: 1 glass wine/week (prior 1 glass martini with dinner)   Drug use: No   Sexual activity: Yes    Birth control/protection: None  Other Topics Concern   Not on file  Social History Narrative   Lives with wife--recently diagnosed with breast ca; No smoking; Occassional wine; Retired; 2 daughters live in [place]--5 grandchildren(boys). On weight watchers. Lives in [place]  with wife. Retired Solicitor. Tobacco history 2 ppd x 32 years. No smoking x 25 years. No drugs.   Social Determinants of Health   Financial Resource Strain: Low Risk  (09/18/2019)   Overall Financial Resource Strain (CARDIA)    Difficulty of Paying Living Expenses: Not hard at all  Food Insecurity: No Food Insecurity (09/18/2019)   Hunger Vital Sign    Worried About Running Out of Food in the Last Year: Never true    Ran Out of Food in the Last Year: Never true  Transportation Needs: No Transportation Needs (09/18/2019)   PRAPARE - Hydrologist (Medical): No    Lack of Transportation (Non-Medical): No  Physical Activity: Inactive (01/12/2022)   Exercise Vital Sign    Days of Exercise per Week: 0 days    Minutes of Exercise per Session: 0 min  Stress: No Stress Concern Present (01/12/2022)   [place]    Feeling of Stress : Not at all  Social Connections: Moderately Isolated (01/12/2022)   Social Connection and Isolation Panel [NHANES]    Frequency of Communication with Friends and Family: Three times a week    Frequency of Social Gatherings with Friends and Family: More than three times a week    Attends Religious Services: Never    Marine scientist or Organizations: No    Attends Archivist Meetings: Never    Marital Status: Married  Human resources officer Violence: Not At Risk (01/12/2022)   Humiliation, Afraid, Rape, and Kick questionnaire    Fear of Current or Ex-Partner: No    Emotionally Abused: No    Physically Abused: No    Sexually Abused: No    Family History:    Family History  Problem Relation Age of Onset   Heart disease Mother    Heart disease Father    Kidney failure Father    Heart disease Sister    Heart attack Sister    Dementia Sister    Sjogren's syndrome Child    Hashimoto's thyroiditis Child    CAD Child      ROS:  Please see the  history of present illness.   All other  ROS reviewed and negative.     Physical Exam/Data:   Vitals:   08/11/22 1001 08/11/22 1015 08/11/22 1100 08/11/22 1200  BP:  (!) 152/67 132/66 122/63  Pulse:  81 78 76  Resp:  '15 15 15  '$ Temp: 98.4 F (36.9 C)     TempSrc: Oral     SpO2:  96% 93% 92%  Weight:      Height:        Intake/Output Summary (Last 24 hours) at 08/11/2022 1241 Last data filed at 08/11/2022 0701 Gross per 24 hour  Intake 354.49 ml  Output --  Net 354.49 ml      08/10/2022    4:33 PM 02/13/2022    1:39 PM 01/22/2022    3:21 PM  Last 3 Weights  Weight (lbs) 202 lb 208 lb 209 lb 12.8 oz  Weight (kg) 91.627 kg 94.348 kg 95.165 kg     Body mass index is 32.6 kg/m.  General:  Well nourished, well developed, in no acute distress HEENT: normal Neck: no JVD Vascular: No carotid bruits; Distal pulses 2+ bilaterally Cardiac:  normal S1, S2; RRR; systolic murmur noted Lungs:  clear to auscultation bilaterally, no wheezing, rhonchi or rales  Abd: soft, nontender, no hepatomegaly  Ext: trace bilateral lower extremity edema Musculoskeletal:  No deformities, BUE and BLE strength normal and equal Skin: warm and dry  Neuro:  CNs 2-12 intact, no focal abnormalities noted Psych:  Normal affect   EKG:  The EKG was personally reviewed and demonstrates:  sinus rhythm with RBBB morphology. No tracing available from 1/30. Telemetry:  Telemetry was personally reviewed and demonstrates:  sinus rhythm with what appears to be a transient second degree type II AV block.  Relevant CV Studies:  02/02/22 TTE  IMPRESSIONS     1. Calcified aortic valve with likely mild to moderate AS (mean gradient  13 mmHg; DI 0.33).   2. Left ventricular ejection fraction, by estimation, is 60 to 65%. The  left ventricle has normal function. The left ventricle has no regional  wall motion abnormalities. There is moderate left ventricular hypertrophy.  Left ventricular diastolic  parameters are  indeterminate. Elevated left atrial pressure.   3. Right ventricular systolic function is normal. The right ventricular  size is normal.   4. Left atrial size was moderately dilated.   5. The mitral valve is normal in structure. No evidence of mitral valve  regurgitation. No evidence of mitral stenosis. Severe mitral annular  calcification.   6. The aortic valve is calcified. Aortic valve regurgitation is trivial.  Mild to moderate aortic valve stenosis.   7. The inferior vena cava is normal in size with greater than 50%  respiratory variability, suggesting right atrial pressure of 3 mmHg.   FINDINGS   Left Ventricle: Left ventricular ejection fraction, by estimation, is 60  to 65%. The left ventricle has normal function. The left ventricle has no  regional wall motion abnormalities. Definity contrast agent was given IV  to delineate the left ventricular   endocardial borders. The left ventricular internal cavity size was normal  in size. There is moderate left ventricular hypertrophy. Left ventricular  diastolic function could not be evaluated due to mitral annular  calcification (moderate or greater). Left   ventricular diastolic parameters are indeterminate. Elevated left atrial  pressure.   Right Ventricle: The right ventricular size is normal. Right ventricular  systolic function is normal.   Left Atrium: Left atrial size was moderately dilated.   Right  Atrium: Right atrial size was normal in size.   Pericardium: There is no evidence of pericardial effusion.   Mitral Valve: The mitral valve is normal in structure. Severe mitral  annular calcification. No evidence of mitral valve regurgitation. No  evidence of mitral valve stenosis.   Tricuspid Valve: The tricuspid valve is normal in structure. Tricuspid  valve regurgitation is trivial. No evidence of tricuspid stenosis.   Aortic Valve: The aortic valve is calcified. Aortic valve regurgitation is  trivial. Mild to  moderate aortic stenosis is present. Aortic valve mean  gradient measures 13.0 mmHg. Aortic valve peak gradient measures 23.2  mmHg. Aortic valve area, by VTI  measures 0.84 cm.   Pulmonic Valve: The pulmonic valve was not well visualized. Pulmonic valve  regurgitation is not visualized. No evidence of pulmonic stenosis.   Aorta: The aortic root is normal in size and structure.   Venous: The inferior vena cava is normal in size with greater than 50%  respiratory variability, suggesting right atrial pressure of 3 mmHg.   IAS/Shunts: No atrial level shunt detected by color flow Doppler.   Additional Comments: Calcified aortic valve with likely mild to moderate  AS (mean gradient 13 mmHg; DI 0.33).   01/28/2022 Carotid duplex  Summary:  Right        Velocities in the right ICA are now consistent with a 40-59%  Carotid:      stenosis.   Left Carotid: Velocities in the left ICA are consistent with a stable  1-39%               stenosis. S/P CEA.   Vertebrals:  Left vertebral artery demonstrates antegrade flow. Right  vertebral              artery demonstrates retrograde flow.  Subclavians: Normal flow hemodynamics were seen in bilateral subclavian               arteries.   Laboratory Data:  High Sensitivity Troponin:   Recent Labs  Lab 08/10/22 1645 08/10/22 2226  TROPONINIHS 27* 36*     Chemistry Recent Labs  Lab 08/10/22 1645 08/11/22 0242  NA 138 136  K 3.6 4.0  CL 105 105  CO2 24 22  GLUCOSE 97 137*  BUN 13 13  CREATININE 1.46* 1.36*  CALCIUM 8.8* 8.9  MG  --  1.8  GFRNONAA 47* 50*  ANIONGAP 9 9    Recent Labs  Lab 08/10/22 1645 08/11/22 0242  PROT 6.7 6.6  ALBUMIN 3.6 3.5  AST 43* 52*  ALT 25 25  ALKPHOS 91 87  BILITOT 2.2* 4.1*   Lipids No results for input(s): "CHOL", "TRIG", "HDL", "LABVLDL", "LDLCALC", "CHOLHDL" in the last 168 hours.  Hematology Recent Labs  Lab 08/10/22 1645 08/11/22 0242  WBC 4.4 6.7  RBC 3.84* 3.90*  HGB 13.6  14.0  HCT 39.3 40.1  MCV 102.3* 102.8*  MCH 35.4* 35.9*  MCHC 34.6 34.9  RDW 15.2 15.2  PLT 95* 88*   Thyroid No results for input(s): "TSH", "FREET4" in the last 168 hours.  BNPNo results for input(s): "BNP", "PROBNP" in the last 168 hours.  DDimer No results for input(s): "DDIMER" in the last 168 hours.   Radiology/Studies:  DG Chest Portable 1 View  Result Date: 08/11/2022 CLINICAL DATA:  Fall with chest compression EXAM: PORTABLE CHEST 1 VIEW COMPARISON:  Yesterday FINDINGS: Stable heart size and mediastinal contours. Mild interstitial coarsening at the bases. There is no edema, consolidation, effusion, or pneumothorax.  IMPRESSION: No acute or interval finding. Electronically Signed   By: Jorje Guild M.D.   On: 08/11/2022 06:20   CT Head Wo Contrast  Result Date: 08/10/2022 CLINICAL DATA:  Fall, hit head on floor, hematoma to back of head EXAM: CT HEAD WITHOUT CONTRAST CT CERVICAL SPINE WITHOUT CONTRAST TECHNIQUE: Multidetector CT imaging of the head and cervical spine was performed following the standard protocol without intravenous contrast. Multiplanar CT image reconstructions of the cervical spine were also generated. RADIATION DOSE REDUCTION: This exam was performed according to the departmental dose-optimization program which includes automated exposure control, adjustment of the mA and/or kV according to patient size and/or use of iterative reconstruction technique. COMPARISON:  None Available. FINDINGS: CT HEAD FINDINGS Brain: No evidence of acute infarction, hemorrhage, hydrocephalus, extra-axial collection or mass lesion/mass effect. Periventricular and deep white matter hypodensity. Vascular: No hyperdense vessel or unexpected calcification. Skull: Normal. Negative for fracture or focal lesion. Sinuses/Orbits: No acute finding. Other: Soft tissue contusion of the scalp vertex (series 4, image 8). CT CERVICAL SPINE FINDINGS Alignment: Normal. Skull base and vertebrae: No acute  fracture. No primary bone lesion or focal pathologic process. Soft tissues and spinal canal: No prevertebral fluid or swelling. No visible canal hematoma. Disc levels: Focally moderate disc space height loss and osteophytosis of C5-C7 with otherwise preserved disc spaces. Upper chest: Negative. Other: None. IMPRESSION: 1. No acute intracranial pathology. Small-vessel white matter disease. 2. Soft tissue contusion of the scalp vertex. 3. No fracture or static subluxation of the cervical spine. Electronically Signed   By: Delanna Ahmadi M.D.   On: 08/10/2022 19:20   CT Cervical Spine Wo Contrast  Result Date: 08/10/2022 CLINICAL DATA:  Fall, hit head on floor, hematoma to back of head EXAM: CT HEAD WITHOUT CONTRAST CT CERVICAL SPINE WITHOUT CONTRAST TECHNIQUE: Multidetector CT imaging of the head and cervical spine was performed following the standard protocol without intravenous contrast. Multiplanar CT image reconstructions of the cervical spine were also generated. RADIATION DOSE REDUCTION: This exam was performed according to the departmental dose-optimization program which includes automated exposure control, adjustment of the mA and/or kV according to patient size and/or use of iterative reconstruction technique. COMPARISON:  None Available. FINDINGS: CT HEAD FINDINGS Brain: No evidence of acute infarction, hemorrhage, hydrocephalus, extra-axial collection or mass lesion/mass effect. Periventricular and deep white matter hypodensity. Vascular: No hyperdense vessel or unexpected calcification. Skull: Normal. Negative for fracture or focal lesion. Sinuses/Orbits: No acute finding. Other: Soft tissue contusion of the scalp vertex (series 4, image 8). CT CERVICAL SPINE FINDINGS Alignment: Normal. Skull base and vertebrae: No acute fracture. No primary bone lesion or focal pathologic process. Soft tissues and spinal canal: No prevertebral fluid or swelling. No visible canal hematoma. Disc levels: Focally moderate  disc space height loss and osteophytosis of C5-C7 with otherwise preserved disc spaces. Upper chest: Negative. Other: None. IMPRESSION: 1. No acute intracranial pathology. Small-vessel white matter disease. 2. Soft tissue contusion of the scalp vertex. 3. No fracture or static subluxation of the cervical spine. Electronically Signed   By: Delanna Ahmadi M.D.   On: 08/10/2022 19:20   CT CHEST ABDOMEN PELVIS W CONTRAST  Result Date: 08/10/2022 CLINICAL DATA:  Fall, syncope, hit head, complains of thoracic back pain to palpation, history of prostate cancer * Tracking Code: BO * EXAM: CT CHEST, ABDOMEN, AND PELVIS WITH CONTRAST CT THORACIC AND LUMBAR SPINE WITH CONTRAST TECHNIQUE: Multidetector CT imaging of the chest, abdomen and pelvis was performed following the standard protocol during  bolus administration of intravenous contrast. Multidetector CT imaging of the thoracic and lumbar spine was performed following the standard protocol during bolus administration of intravenous contrast. RADIATION DOSE REDUCTION: This exam was performed according to the departmental dose-optimization program which includes automated exposure control, adjustment of the mA and/or kV according to patient size and/or use of iterative reconstruction technique. CONTRAST:  50m OMNIPAQUE IOHEXOL 350 MG/ML SOLN COMPARISON:  CT abdomen pelvis, 10/18/2020 FINDINGS: CT CHEST FINDINGS Cardiovascular: Aortic atherosclerosis. Dense aortic valve calcifications. Normal heart size. Three-vessel coronary artery calcifications. Very dense mitral annulus calcifications. No pericardial effusion. Mediastinum/Nodes: No enlarged mediastinal, hilar, or axillary lymph nodes. Small hiatal hernia. Thyroid gland, trachea, and esophagus demonstrate no significant findings. Lungs/Pleura: Mild paraseptal emphysema. Diffuse bilateral bronchial wall thickening. Dependent bibasilar scarring and or atelectasis. No pleural effusion or pneumothorax. Musculoskeletal: No  chest wall mass or suspicious osseous lesions identified. CT ABDOMEN PELVIS FINDINGS Hepatobiliary: No solid liver abnormality is seen. Small benign left lobe liver cysts, for which no further follow-up or characterization is required. Coarse nodular cirrhotic morphology of the liver. Status post cholecystectomy. No biliary ductal dilatation. Pancreas: Unremarkable. No pancreatic ductal dilatation or surrounding inflammatory changes. Spleen: Splenomegaly, maximum coronal span 15.0 cm. Adrenals/Urinary Tract: Adrenal glands are unremarkable. Severely atrophic right kidney with a small nonobstructive calculus of the midportion. Left kidney is normal. No ureteral calculi or hydronephrosis. Bladder is unremarkable. Stomach/Bowel: Stomach is within normal limits. Appendix appears normal. No evidence of bowel wall thickening, distention, or inflammatory changes. Descending and sigmoid diverticulosis. Vascular/Lymphatic: Aortic atherosclerosis. 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 No enlarged abdominal or pelvic lymph nodes. Reproductive: Status post prostatectomy. Other: Small fat containing bilateral inguinal hernias.  No ascites. Musculoskeletal: No acute osseous findings. CT THORACIC AND LUMBAR SPINE FINDINGS Alignment: Gentle dextroscoliosis of the thoracic spine, apex T6, with otherwise normal thoracic kyphosis. Normal lumbar lordosis. Vertebral bodies: Osteopenia. Probable very subtle superior endplate deformity of L1 with no significant anterior height loss (series 6, image 34). Unchanged, chronic Schmorl deformity of the superior endplate of L3 (series 6, image 41). No fracture or dislocation. Disc spaces: Mild multilevel disc space height loss and osteophytosis throughout the thoracic and lumbar spine, with bridging osteophytosis of the mid to lower thoracic spine in keeping with DISH. Paraspinous soft tissues: Unremarkable. IMPRESSION: 1. No CT evidence of acute  traumatic injury to the chest, abdomen, or pelvis. 2. Probable very subtle superior endplate deformity of L1 with no significant anterior height loss. Correlate for acute point tenderness. 3. Osteopenia. 4. Coronary artery disease. 5. Dense aortic valve calcifications. Correlate for echocardiographic evidence of aortic valve dysfunction. 6. Cirrhosis and splenomegaly. 7. Severely atrophic right kidney with a small nonobstructive calculus of the midportion. 8. Descending and sigmoid diverticulosis without evidence of acute diverticulitis. 9. Status post prostatectomy. No evidence of lymphadenopathy or metastatic disease in the abdomen or pelvis. 10. Aortic atherosclerosis. 11. Emphysema. Aortic Atherosclerosis (ICD10-I70.0) and Emphysema (ICD10-J43.9). Electronically Signed   By: ADelanna AhmadiM.D.   On: 08/10/2022 19:16   CT L-SPINE NO CHARGE  Result Date: 08/10/2022 CLINICAL DATA:  Fall, syncope, hit head, complains of thoracic back pain to palpation, history of prostate cancer * Tracking Code: BO * EXAM: CT CHEST, ABDOMEN, AND PELVIS WITH CONTRAST CT THORACIC AND LUMBAR SPINE WITH CONTRAST TECHNIQUE: Multidetector CT imaging of the chest, abdomen and pelvis was performed following the standard protocol during bolus administration of intravenous contrast. Multidetector CT imaging of the thoracic and lumbar  spine was performed following the standard protocol during bolus administration of intravenous contrast. RADIATION DOSE REDUCTION: This exam was performed according to the departmental dose-optimization program which includes automated exposure control, adjustment of the mA and/or kV according to patient size and/or use of iterative reconstruction technique. CONTRAST:  11m OMNIPAQUE IOHEXOL 350 MG/ML SOLN COMPARISON:  CT abdomen pelvis, 10/18/2020 FINDINGS: CT CHEST FINDINGS Cardiovascular: Aortic atherosclerosis. Dense aortic valve calcifications. Normal heart size. Three-vessel coronary artery  calcifications. Very dense mitral annulus calcifications. No pericardial effusion. Mediastinum/Nodes: No enlarged mediastinal, hilar, or axillary lymph nodes. Small hiatal hernia. Thyroid gland, trachea, and esophagus demonstrate no significant findings. Lungs/Pleura: Mild paraseptal emphysema. Diffuse bilateral bronchial wall thickening. Dependent bibasilar scarring and or atelectasis. No pleural effusion or pneumothorax. Musculoskeletal: No chest wall mass or suspicious osseous lesions identified. CT ABDOMEN PELVIS FINDINGS Hepatobiliary: No solid liver abnormality is seen. Small benign left lobe liver cysts, for which no further follow-up or characterization is required. Coarse nodular cirrhotic morphology of the liver. Status post cholecystectomy. No biliary ductal dilatation. Pancreas: Unremarkable. No pancreatic ductal dilatation or surrounding inflammatory changes. Spleen: Splenomegaly, maximum coronal span 15.0 cm. Adrenals/Urinary Tract: Adrenal glands are unremarkable. Severely atrophic right kidney with a small nonobstructive calculus of the midportion. Left kidney is normal. No ureteral calculi or hydronephrosis. Bladder is unremarkable. Stomach/Bowel: Stomach is within normal limits. Appendix appears normal. No evidence of bowel wall thickening, distention, or inflammatory changes. Descending and sigmoid diverticulosis. Vascular/Lymphatic: Aortic atherosclerosis. 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 No enlarged abdominal or pelvic lymph nodes. Reproductive: Status post prostatectomy. Other: Small fat containing bilateral inguinal hernias.  No ascites. Musculoskeletal: No acute osseous findings. CT THORACIC AND LUMBAR SPINE FINDINGS Alignment: Gentle dextroscoliosis of the thoracic spine, apex T6, with otherwise normal thoracic kyphosis. Normal lumbar lordosis. Vertebral bodies: Osteopenia. Probable very subtle superior endplate deformity of L1 with no  significant anterior height loss (series 6, image 34). Unchanged, chronic Schmorl deformity of the superior endplate of L3 (series 6, image 41). No fracture or dislocation. Disc spaces: Mild multilevel disc space height loss and osteophytosis throughout the thoracic and lumbar spine, with bridging osteophytosis of the mid to lower thoracic spine in keeping with DISH. Paraspinous soft tissues: Unremarkable. IMPRESSION: 1. No CT evidence of acute traumatic injury to the chest, abdomen, or pelvis. 2. Probable very subtle superior endplate deformity of L1 with no significant anterior height loss. Correlate for acute point tenderness. 3. Osteopenia. 4. Coronary artery disease. 5. Dense aortic valve calcifications. Correlate for echocardiographic evidence of aortic valve dysfunction. 6. Cirrhosis and splenomegaly. 7. Severely atrophic right kidney with a small nonobstructive calculus of the midportion. 8. Descending and sigmoid diverticulosis without evidence of acute diverticulitis. 9. Status post prostatectomy. No evidence of lymphadenopathy or metastatic disease in the abdomen or pelvis. 10. Aortic atherosclerosis. 11. Emphysema. Aortic Atherosclerosis (ICD10-I70.0) and Emphysema (ICD10-J43.9). Electronically Signed   By: ADelanna AhmadiM.D.   On: 08/10/2022 19:16   CT T-SPINE NO CHARGE  Result Date: 08/10/2022 CLINICAL DATA:  Fall, syncope, hit head, complains of thoracic back pain to palpation, history of prostate cancer * Tracking Code: BO * EXAM: CT CHEST, ABDOMEN, AND PELVIS WITH CONTRAST CT THORACIC AND LUMBAR SPINE WITH CONTRAST TECHNIQUE: Multidetector CT imaging of the chest, abdomen and pelvis was performed following the standard protocol during bolus administration of intravenous contrast. Multidetector CT imaging of the thoracic and lumbar spine was performed following the standard protocol during bolus administration of intravenous contrast.  RADIATION DOSE REDUCTION: This exam was performed according to  the departmental dose-optimization program which includes automated exposure control, adjustment of the mA and/or kV according to patient size and/or use of iterative reconstruction technique. CONTRAST:  3m OMNIPAQUE IOHEXOL 350 MG/ML SOLN COMPARISON:  CT abdomen pelvis, 10/18/2020 FINDINGS: CT CHEST FINDINGS Cardiovascular: Aortic atherosclerosis. Dense aortic valve calcifications. Normal heart size. Three-vessel coronary artery calcifications. Very dense mitral annulus calcifications. No pericardial effusion. Mediastinum/Nodes: No enlarged mediastinal, hilar, or axillary lymph nodes. Small hiatal hernia. Thyroid gland, trachea, and esophagus demonstrate no significant findings. Lungs/Pleura: Mild paraseptal emphysema. Diffuse bilateral bronchial wall thickening. Dependent bibasilar scarring and or atelectasis. No pleural effusion or pneumothorax. Musculoskeletal: No chest wall mass or suspicious osseous lesions identified. CT ABDOMEN PELVIS FINDINGS Hepatobiliary: No solid liver abnormality is seen. Small benign left lobe liver cysts, for which no further follow-up or characterization is required. Coarse nodular cirrhotic morphology of the liver. Status post cholecystectomy. No biliary ductal dilatation. Pancreas: Unremarkable. No pancreatic ductal dilatation or surrounding inflammatory changes. Spleen: Splenomegaly, maximum coronal span 15.0 cm. Adrenals/Urinary Tract: Adrenal glands are unremarkable. Severely atrophic right kidney with a small nonobstructive calculus of the midportion. Left kidney is normal. No ureteral calculi or hydronephrosis. Bladder is unremarkable. Stomach/Bowel: Stomach is within normal limits. Appendix appears normal. No evidence of bowel wall thickening, distention, or inflammatory changes. Descending and sigmoid diverticulosis. Vascular/Lymphatic: Aortic atherosclerosis. 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 No enlarged abdominal or  pelvic lymph nodes. Reproductive: Status post prostatectomy. Other: Small fat containing bilateral inguinal hernias.  No ascites. Musculoskeletal: No acute osseous findings. CT THORACIC AND LUMBAR SPINE FINDINGS Alignment: Gentle dextroscoliosis of the thoracic spine, apex T6, with otherwise normal thoracic kyphosis. Normal lumbar lordosis. Vertebral bodies: Osteopenia. Probable very subtle superior endplate deformity of L1 with no significant anterior height loss (series 6, image 34). Unchanged, chronic Schmorl deformity of the superior endplate of L3 (series 6, image 41). No fracture or dislocation. Disc spaces: Mild multilevel disc space height loss and osteophytosis throughout the thoracic and lumbar spine, with bridging osteophytosis of the mid to lower thoracic spine in keeping with DISH. Paraspinous soft tissues: Unremarkable. IMPRESSION: 1. No CT evidence of acute traumatic injury to the chest, abdomen, or pelvis. 2. Probable very subtle superior endplate deformity of L1 with no significant anterior height loss. Correlate for acute point tenderness. 3. Osteopenia. 4. Coronary artery disease. 5. Dense aortic valve calcifications. Correlate for echocardiographic evidence of aortic valve dysfunction. 6. Cirrhosis and splenomegaly. 7. Severely atrophic right kidney with a small nonobstructive calculus of the midportion. 8. Descending and sigmoid diverticulosis without evidence of acute diverticulitis. 9. Status post prostatectomy. No evidence of lymphadenopathy or metastatic disease in the abdomen or pelvis. 10. Aortic atherosclerosis. 11. Emphysema. Aortic Atherosclerosis (ICD10-I70.0) and Emphysema (ICD10-J43.9). Electronically Signed   By: ADelanna AhmadiM.D.   On: 08/10/2022 19:16   DG Pelvis Portable  Result Date: 08/10/2022 CLINICAL DATA:  Fall, right-sided hip pain EXAM: PORTABLE PELVIS 1-2 VIEWS COMPARISON:  None Available. FINDINGS: Osteopenia. There is no evidence of displaced pelvic fracture or  diastasis. No pelvic bone lesions are seen. IMPRESSION: Osteopenia. No displaced fracture of the pelvis or bilateral proximal femurs seen in single frontal view. Please note that plain radiographs are significantly insensitive for hip and pelvic fracture. Recommend CT or MRI to more sensitively evaluate if there is high clinical suspicion for fracture. Electronically Signed   By: ADelanna AhmadiM.D.   On: 08/10/2022 17:11  DG Chest Portable 1 View  Result Date: 08/10/2022 CLINICAL DATA:  Back and right-sided chest pain following recent fall, initial encounter EXAM: PORTABLE CHEST 1 VIEW COMPARISON:  11/10/2020 FINDINGS: The heart size and mediastinal contours are within normal limits. Both lungs are clear. Bony structures show a vague lucency in the superior aspect of the scapula. An undisplaced fracture cannot be totally excluded given the patient's clinical history. IMPRESSION: No focal infiltrate noted. Changes suspicious for right scapular fracture. Correlate to point tenderness. Cross-sectional imaging may be helpful as clinically indicated. Electronically Signed   By: Inez Catalina M.D.   On: 08/10/2022 17:10     Assessment and Plan:  [name] is a 89 y.o. male with a hx of CAD, prostate CA s/p radical prostatectomy 1998, HTN, HLD, h/o carotid artery disease s/p L CEA 1998, paroxysmal afib (no longer on anticoagulation), chronic balance issues, and TIA who is being seen 08/11/2022 for the evaluation of syncope, possible PEA arrest at the request of Dr. [place] Crane.  Sudden syncope and collapse Second degree type II AV block  Patient with a near syncopal episode leading to a fall as well as a second fall with full loss of consciousness in the last 48 hours. Additional episode of unresponsiveness with possible loss of pulses while in the ED. Interestingly no pre-syncopal dizziness, chest pain, palpitations. I am unable to see telemetry data for patient around the time of this morning's episode. However,  current tele data is concerning for second degree type II AV block.  Suspect that patient has underlying conduction disease. He has known bilateral carotid artery stenosis but based on most recent duplex study, would not expect this to be cause of syncope. Will reach out to EP for pacemaker consult. Avoid AV nodal blocking medications We will follow echocardiogram results after study completed today  CAD HLD  Patient s/p DES to RCA in 2017. No recent symptoms of angina or exertional limitation. Continue ASA '81mg'$ . Patient intolerant of statins, recommend Zetia.  Aortic stenosis  Aortic valve mean gradient of 13.88mHg, peak gradient 23.2 mmHg, VTI 0.84 cm2 per 02/02/22 TTE. Unlikely to be causing syncope. Will follow updated study today.   Hypertension  Patient currently with stable blood pressure. Avoid beta blockers given heart block.   Paroxysmal atrial fibrillation  Patient with isolated atrial fibrillation in 2018. He was started on Xarelto but stopped after GIB and erosive gastroenteritis. No recorded recurrence of afib since.   Risk Assessment/Risk Scores:                For questions or updates, please contact [email or web address] consult [email or web address] for contact info under    Signed, ELily Kocher PA-C  08/11/2022 12:41 PM

## 2022-08-12 ENCOUNTER — Encounter (HOSPITAL_COMMUNITY)
Admission: EM | Disposition: A | Discharge status: Skilled Nursing Facility | Payer: Self-pay | Source: Home / Self Care | Attending: Internal Medicine

## 2022-08-12 ENCOUNTER — Inpatient Hospital Stay (HOSPITAL_COMMUNITY): Payer: Medicare Other

## 2022-08-12 ENCOUNTER — Encounter (HOSPITAL_COMMUNITY): Payer: Self-pay | Admitting: Cardiology

## 2022-08-12 DIAGNOSIS — I480 Paroxysmal atrial fibrillation: Secondary | ICD-10-CM | POA: Diagnosis not present

## 2022-08-12 DIAGNOSIS — N1832 Chronic kidney disease, stage 3b: Secondary | ICD-10-CM | POA: Diagnosis not present

## 2022-08-12 DIAGNOSIS — I441 Atrioventricular block, second degree: Secondary | ICD-10-CM | POA: Diagnosis not present

## 2022-08-12 DIAGNOSIS — R55 Syncope and collapse: Secondary | ICD-10-CM

## 2022-08-12 DIAGNOSIS — I442 Atrioventricular block, complete: Secondary | ICD-10-CM | POA: Diagnosis not present

## 2022-08-12 HISTORY — PX: PACEMAKER IMPLANT: EP1218

## 2022-08-12 LAB — CBC
HCT: 40.6 % (ref 39.0–52.0)
Hemoglobin: 14.3 g/dL (ref 13.0–17.0)
MCH: 35.8 pg — ABNORMAL HIGH (ref 26.0–34.0)
MCHC: 35.2 g/dL (ref 30.0–36.0)
MCV: 101.5 fL — ABNORMAL HIGH (ref 80.0–100.0)
Platelets: 99 10*3/uL — ABNORMAL LOW (ref 150–400)
RBC: 4 MIL/uL — ABNORMAL LOW (ref 4.22–5.81)
RDW: 15.4 % (ref 11.5–15.5)
WBC: 7.9 10*3/uL (ref 4.0–10.5)
nRBC: 0 % (ref 0.0–0.2)

## 2022-08-12 LAB — ECHOCARDIOGRAM COMPLETE
AR max vel: 1.36 cm2
AV Area VTI: 1.24 cm2
AV Area mean vel: 1.3 cm2
AV Mean grad: 21.5 mmHg
AV Peak grad: 39.7 mmHg
Ao pk vel: 3.15 m/s
Area-P 1/2: 2.95 cm2
Height: 66 in
Weight: 3252.23 oz

## 2022-08-12 LAB — COMPREHENSIVE METABOLIC PANEL
ALT: 30 U/L (ref 0–44)
AST: 61 U/L — ABNORMAL HIGH (ref 15–41)
Albumin: 3.5 g/dL (ref 3.5–5.0)
Alkaline Phosphatase: 89 U/L (ref 38–126)
Anion gap: 9 (ref 5–15)
BUN: 16 mg/dL (ref 8–23)
CO2: 27 mmol/L (ref 22–32)
Calcium: 9.1 mg/dL (ref 8.9–10.3)
Chloride: 101 mmol/L (ref 98–111)
Creatinine, Ser: 1.48 mg/dL — ABNORMAL HIGH (ref 0.61–1.24)
GFR, Estimated: 46 mL/min — ABNORMAL LOW (ref >60)
Glucose, Bld: 133 mg/dL — ABNORMAL HIGH (ref 70–99)
Potassium: 4.5 mmol/L (ref 3.5–5.1)
Sodium: 137 mmol/L (ref 135–145)
Total Bilirubin: 4.6 mg/dL — ABNORMAL HIGH (ref 0.3–1.2)
Total Protein: 6.6 g/dL (ref 6.5–8.1)

## 2022-08-12 LAB — GLUCOSE, CAPILLARY
Glucose-Capillary: 146 mg/dL — ABNORMAL HIGH (ref 70–99)
Glucose-Capillary: 129 mg/dL — ABNORMAL HIGH (ref 70–99)

## 2022-08-12 LAB — SURGICAL PCR SCREEN
MRSA, PCR: NEGATIVE
Staphylococcus aureus: NEGATIVE

## 2022-08-12 LAB — HEMOGLOBIN A1C
Hgb A1c MFr Bld: 5.6 % (ref 4.8–5.6)
Mean Plasma Glucose: 114.02 mg/dL

## 2022-08-12 SURGERY — PACEMAKER IMPLANT

## 2022-08-12 MED ORDER — PERFLUTREN LIPID MICROSPHERE
INTRAVENOUS | Status: AC
  Administered 2022-08-12: 4 mL via INTRAVENOUS
  Filled 2022-08-12: qty 10

## 2022-08-12 MED ORDER — CEFAZOLIN SODIUM-DEXTROSE 2-4 GM/100ML-% IV SOLN
INTRAVENOUS | Status: AC
  Filled 2022-08-12: qty 100

## 2022-08-12 MED ORDER — PERFLUTREN LIPID MICROSPHERE: 1.0000 mL | INTRAVENOUS | Status: AC | PRN

## 2022-08-12 MED ORDER — SODIUM CHLORIDE 0.9 % IV SOLN: INTRAVENOUS | Status: DC

## 2022-08-12 MED ORDER — LIDOCAINE HCL (PF) 1 % IJ SOLN
INTRAMUSCULAR | Status: AC
  Filled 2022-08-12: qty 60

## 2022-08-12 MED ORDER — INSULIN ASPART 100 UNIT/ML IJ SOLN
0.0000 [IU] | Freq: Three times a day (TID) | INTRAMUSCULAR | Status: DC
  Administered 2022-08-13: 3 [IU] via SUBCUTANEOUS
  Administered 2022-08-13 – 2022-08-17 (×6): 2 [IU] via SUBCUTANEOUS
  Administered 2022-08-17: 5 [IU] via SUBCUTANEOUS
  Administered 2022-08-18: 2 [IU] via SUBCUTANEOUS
  Administered 2022-08-19: 3 [IU] via SUBCUTANEOUS
  Administered 2022-08-19: 2 [IU] via SUBCUTANEOUS
  Administered 2022-08-20: 3 [IU] via SUBCUTANEOUS

## 2022-08-12 MED ORDER — CEFAZOLIN SODIUM-DEXTROSE 2-4 GM/100ML-% IV SOLN
2.0000 g | INTRAVENOUS | Status: AC
  Administered 2022-08-12: 2 g via INTRAVENOUS

## 2022-08-12 MED ORDER — HEPARIN (PORCINE) IN NACL 1000-0.9 UT/500ML-% IV SOLN
INTRAVENOUS | Status: DC | PRN
  Administered 2022-08-12: 500 mL

## 2022-08-12 MED ORDER — SODIUM CHLORIDE 0.9 % IV SOLN
80.0000 mg | INTRAVENOUS | Status: AC
  Administered 2022-08-12: 80 mg
  Filled 2022-08-12: qty 2

## 2022-08-12 MED ORDER — ACETAMINOPHEN 325 MG PO TABS
325.0000 mg | ORAL_TABLET | ORAL | Status: DC | PRN
  Administered 2022-08-13 – 2022-08-20 (×2): 650 mg via ORAL
  Filled 2022-08-12: qty 2

## 2022-08-12 MED ORDER — LIDOCAINE HCL (PF) 1 % IJ SOLN
INTRAMUSCULAR | Status: DC | PRN
  Administered 2022-08-12: 60 mL

## 2022-08-12 MED ORDER — CEFAZOLIN SODIUM-DEXTROSE 1-4 GM/50ML-% IV SOLN
1.0000 g | Freq: Four times a day (QID) | INTRAVENOUS | Status: AC
  Administered 2022-08-12 – 2022-08-13 (×3): 1 g via INTRAVENOUS
  Filled 2022-08-12 (×5): qty 50

## 2022-08-12 MED ORDER — SODIUM CHLORIDE 0.9 % IV SOLN
INTRAVENOUS | Status: AC
  Filled 2022-08-12: qty 2

## 2022-08-12 MED ORDER — ONDANSETRON HCL 4 MG/2ML IJ SOLN: 4.0000 mg | Freq: Four times a day (QID) | INTRAMUSCULAR | Status: DC | PRN

## 2022-08-12 MED ORDER — CHLORHEXIDINE GLUCONATE 4 % EX LIQD
60.0000 mL | Freq: Once | CUTANEOUS | Status: DC
  Administered 2022-08-12: 4 via TOPICAL

## 2022-08-12 MED FILL — Atropine Sulfate Soln Prefill Syr 1 MG/10ML (0.1 MG/ML): INTRAMUSCULAR | Qty: 10 | Status: AC

## 2022-08-12 SURGICAL SUPPLY — 15 items
CABLE SURGICAL S-101-97-12 (CABLE) ×2 IMPLANT
CATH CPS LOCATOR 3D MED (CATHETERS) IMPLANT
HELIX LOCKING TOOL (MISCELLANEOUS) ×1
LEAD ULTIPACE 52 LPA1231/52 (Lead) IMPLANT
LEAD ULTIPACE 65 LPA1231/65 (Lead) IMPLANT
MAT PREVALON FULL STRYKER (MISCELLANEOUS) IMPLANT
PACEMAKER ASSURITY DR-RF (Pacemaker) IMPLANT
PAD DEFIB RADIO PHYSIO CONN (PAD) ×2 IMPLANT
SHEATH 7FR PRELUDE SNAP 13 (SHEATH) IMPLANT
SHEATH 9FR PRELUDE SNAP 13 (SHEATH) IMPLANT
SHEATH PROBE COVER 6X72 (BAG) IMPLANT
SLITTER AGILIS HISPRO (INSTRUMENTS) IMPLANT
TOOL HELIX LOCKING (MISCELLANEOUS) IMPLANT
TRAY PACEMAKER INSERTION (PACKS) ×2 IMPLANT
WIRE HI TORQ VERSACORE-J 145CM (WIRE) IMPLANT

## 2022-08-12 NOTE — Progress Notes (Signed)
Stable, awaiting EP input. Available PRN.  Erskine Emery MD PCCM

## 2022-08-12 NOTE — Progress Notes (Signed)
Patient declined the use of CPAP for the night  

## 2022-08-12 NOTE — Discharge Instructions (Addendum)
After Your Pacemaker   You have a [employer] Pacemaker  ACTIVITY Do not lift your arm above shoulder height for 1 week after your procedure. After 7 days, you may progress as below.  You should remove your sling 24 hours after your procedure, unless otherwise instructed by your provider.     Wednesday August 19, 2022  Thursday August 20, 2022 Friday August 21, 2022 Saturday August 22, 2022   Do not lift, push, pull, or carry anything over 10 pounds with the affected arm until 6 weeks (Wednesday September 23, 2022 ) after your procedure.   You may drive AFTER your wound check, unless you have been told otherwise by your provider.   Ask your healthcare provider when you can go back to work   INCISION/Dressing If you are on a blood thinner such as Coumadin, Xarelto, Eliquis, Plavix, or Pradaxa please confirm with your provider when this should be resumed.   If large square, outer bandage is left in place, this can be removed after 24 hours from your procedure. Do not remove steri-strips or glue as below.   Monitor your Pacemaker site for redness, swelling, and drainage. Call the device clinic at [phone] if you experience these symptoms or fever/chills.  If your incision is sealed with Steri-strips or staples, you may shower 7 days after your procedure or when told by your provider. Do not remove the steri-strips or let the shower hit directly on your site. You may wash around your site with soap and water.    If you were discharged in a sling, please do not wear this during the day more than 48 hours after your surgery unless otherwise instructed. This may increase the risk of stiffness and soreness in your shoulder.   Avoid lotions, ointments, or perfumes over your incision until it is well-healed.  You may use a hot tub or a pool AFTER your wound check appointment if the incision is completely closed.  Pacemaker Alerts:  Some alerts are vibratory and others beep. These are NOT  emergencies. Please call our office to let us know. If this occurs at night or on weekends, it can wait until the next business day. Send a remote transmission.  If your device is capable of reading fluid status (for heart failure), you will be offered monthly monitoring to review this with you.   DEVICE MANAGEMENT Remote monitoring is used to monitor your pacemaker from home. This monitoring is scheduled every 91 days by our office. It allows [place] to keep an eye on the functioning of your device to ensure it is working properly. You will routinely see your Electrophysiologist annually (more often if necessary).   You should receive your ID card for your new device in 4-8 weeks. Keep this card with you at all times once received. Consider wearing a medical alert bracelet or necklace.  Your Pacemaker may be MRI compatible. This will be discussed at your next office visit/wound check.  You should avoid contact with strong electric or magnetic fields.   Do not use amateur (ham) radio equipment or electric (arc) welding torches. MP3 player headphones with magnets should not be used. Some devices are safe to use if held at least 12 inches (30 cm) from your Pacemaker. These include power tools, lawn mowers, and speakers. If you are unsure if something is safe to use, ask your health care provider.  When using your cell phone, hold it to the ear that is on the opposite side from the  Pacemaker. Do not leave your cell phone in a pocket over the Pacemaker.  You may safely use electric blankets, heating pads, computers, and microwave ovens.  Call the office right away if: You have chest pain. You feel more short of breath than you have felt before. You feel more light-headed than you have felt before. Your incision starts to open up.  This information is not intended to replace advice given to you by your health care provider. Make sure you discuss any questions you have with your health care provider.

## 2022-08-12 NOTE — Progress Notes (Signed)
PT Cancellation Note  Patient Details Name: [name] MRN: [id number] DOB: 1933/09/24   Cancelled Treatment:    Reason Eval/Treat Not Completed: Other (comment). Pt with plans for pacemaker implant today. Will plan to follow-up as able.   Moishe Spice, PT, DPT Acute Rehabilitation Services  Office: [place] 08/12/2022, 9:09 AM

## 2022-08-12 NOTE — Progress Notes (Signed)
PROGRESS NOTE    [name]  QQP:[id number] DOB: 03-May-1934 DOA: 08/10/2022 PCP: [name], MD   Brief Narrative:  [name] is a 89 y.o. male with medical history significant for chronic dizziness/balance issues for at least 2 years, coronary artery disease status post PCI with stenting, paroxysmal A-fib, previously on Xarelto and no longer on DOAC after GI bleeding episodes, prostate cancer status post radical prostatectomy in 1998, hypertension, hyperlipidemia with intolerance to statin, history of carotid artery disease status post left carotid endarterectomy in 1998, OSA on CPAP, bilateral sensorineural hearing dysfunction, and TIA, who presented to [facility] ED from home via EMS due to multiple syncopal episodes at home.  Patient indicates that he had no prodrome or symptoms other than noticing that the room had moved and that approximately halfway through his fall he notes loss of consciousness and awoke on the floor with his wife over him.  Loss of consciousness may be a few minutes, no postictal or confusion once aroused.  Given second fall where he struck his head on the kitchen floor the wife called EMS for transport to the hospital. Imaging unremarkable for fracture or bleed or acute findings.  Hospitalist called for admission.  Assessment & Plan:   Syncope secondary to second-degree heart block  Had another symptomatic episode yesterday.  Temporary pacemaker had to be inserted yesterday evening. Cardiology is following.  Plan is for pacemaker placement today. Noted to be confused this morning but following commands.  CT head done at the time of admission did not show any acute findings.  Has not had any falls since then.  Current confusion is likely due to his acute issues including heart block and possible syncopal episode.  Will continue to monitor for now.   Elevated troponin, suspect demand ischemia in the setting of syncope Minimally elevated.  Likely due to demand  ischemia.  Denies any chest pain this morning.   Echocardiogram is pending.     Prolonged Qtc Continue to monitor on telemetry.  Potassium 4.5 today.  Magnesium was 1.8 yesterday.  Avoid QT prolonging medications.   Paroxysmal A-fib, previously on Xarelto  No longer on DOAC after history of GI bleeding episodes Not on rate control agents, rate is controlled Continue telemetry monitoring   Elevated AST, bilirubin secondary to history of liver cirrhosis Ultrasound suggested liver cirrhosis.  Outpatient management of same.  Thrombocytopenia Secondary to liver cirrhosis.  No evidence for bleeding currently.  Hypothyroidism Continue thyroid medications.   BPH Resume home Flomax Monitor UO   CKD 3B Creatinine at baseline 1.46 with GFR 47. Avoid nephrotoxic agents, dehydration and hypotension.   Chronic diastolic CHF Euvolemic on exam.   DVT prophylaxis: Holding chemical prophylaxis given high risk for GI bleed given history  Code Status: Full Family Communication: Wife at bedside Disposition: To be determined.  Status is: Inpatient Remains inpatient appropriate because: Second-degree heart block   Consultants:  Cardiology, PCCM  Procedures:  None  Antimicrobials:  None indicated  Subjective: Patient noted to be confused this morning.  Following commands.  Complains of pain all over.    Objective: Vitals:   08/12/22 0400 08/12/22 0500 08/12/22 0600 08/12/22 0808  BP: (!) 156/66 (!) 154/61 (!) 162/59   Pulse: 60 60 61   Resp: (!) 21 20 (!) 24   Temp: 99 F (37.2 C)   98.1 F (36.7 C)  TempSrc: Axillary   Oral  SpO2: 96% 94% 96%   Weight:  Height:        Intake/Output Summary (Last 24 hours) at 08/12/2022 0921 Last data filed at 08/12/2022 0600 Gross per 24 hour  Intake 334.38 ml  Output 400 ml  Net -65.62 ml    Filed Weights   08/10/22 1633 08/12/22 0358  Weight: 91.6 kg 92.2 kg    Examination:  General appearance: Awake alert.  In no  distress.  Distracted Resp: Clear to auscultation bilaterally.  Normal effort Cardio: S1-S2 is normal regular.  No S3-S4.  No rubs murmurs or bruit GI: Abdomen is soft.  Mildly tender diffuse without any rebound rigidity or guarding.  No masses organomegaly. Extremities: No edema.  Full range of motion of lower extremities. Neurologic: Confused.  No obvious focal neurological deficits noted.  Data Reviewed: I have personally reviewed following labs and imaging studies  CBC: Recent Labs  Lab 08/10/22 1645 08/11/22 0242 08/12/22 0107  WBC 4.4 6.7 7.9  NEUTROABS 2.4  --   --   HGB 13.6 14.0 14.3  HCT 39.3 40.1 40.6  MCV 102.3* 102.8* 101.5*  PLT 95* 88* 99*    Basic Metabolic Panel: Recent Labs  Lab 08/10/22 1645 08/11/22 0242 08/12/22 0107  NA 138 136 137  K 3.6 4.0 4.5  CL 105 105 101  CO2 '24 22 27  '$ GLUCOSE 97 137* 133*  BUN '13 13 16  '$ CREATININE 1.46* 1.36* 1.48*  CALCIUM 8.8* 8.9 9.1  MG  --  1.8  --   PHOS  --  3.0  --     GFR: Estimated Creatinine Clearance: 37.4 mL/min (A) (by C-G formula based on SCr of 1.48 mg/dL (H)). Liver Function Tests: Recent Labs  Lab 08/10/22 1645 08/11/22 0242 08/12/22 0107  AST 43* 52* 61*  ALT '25 25 30  '$ ALKPHOS 91 87 89  BILITOT 2.2* 4.1* 4.6*  PROT 6.7 6.6 6.6  ALBUMIN 3.6 3.5 3.5     CBG: Recent Labs  Lab 08/11/22 2026  GLUCAP 124*     Recent Results (from the past 240 hour(s))  Resp panel by RT-PCR (RSV, Flu A&B, Covid) Anterior Nasal Swab     Status: None   Collection Time: 08/10/22  5:00 PM   Specimen: Anterior Nasal Swab  Result Value Ref Range Status   SARS Coronavirus 2 by RT PCR NEGATIVE NEGATIVE Final    Comment: (NOTE) SARS-CoV-2 target nucleic acids are NOT DETECTED.  The SARS-CoV-2 RNA is generally detectable in upper respiratory specimens during the acute phase of infection. The lowest concentration of SARS-CoV-2 viral copies this assay can detect is 138 copies/mL. A negative result does not  preclude SARS-Cov-2 infection and should not be used as the sole basis for treatment or other patient management decisions. A negative result may occur with  improper specimen collection/handling, submission of specimen other than nasopharyngeal swab, presence of viral mutation(s) within the areas targeted by this assay, and inadequate number of viral copies(<138 copies/mL). A negative result must be combined with clinical observations, patient history, and epidemiological information. The expected result is Negative.  Fact Sheet for Patients:  EntrepreneurPulse.com.au  Fact Sheet for Healthcare Providers:  IncredibleEmployment.be  This test is no t yet approved or cleared by the [place] FDA and  has been authorized for detection and/or diagnosis of SARS-CoV-2 by FDA under an Emergency Use Authorization (EUA). This EUA will remain  in effect (meaning this test can be used) for the duration of the COVID-19 declaration under Section 564(b)(1) of the Act, 21 U.S.C.section 360bbb-3(b)(1),  unless the authorization is terminated  or revoked sooner.       Influenza A by PCR NEGATIVE NEGATIVE Final   Influenza B by PCR NEGATIVE NEGATIVE Final    Comment: (NOTE) The Xpert Xpress SARS-CoV-2/FLU/RSV plus assay is intended as an aid in the diagnosis of influenza from Nasopharyngeal swab specimens and should not be used as a sole basis for treatment. Nasal washings and aspirates are unacceptable for Xpert Xpress SARS-CoV-2/FLU/RSV testing.  Fact Sheet for Patients: EntrepreneurPulse.com.au  Fact Sheet for Healthcare Providers: IncredibleEmployment.be  This test is not yet approved or cleared by the [place] FDA and has been authorized for detection and/or diagnosis of SARS-CoV-2 by FDA under an Emergency Use Authorization (EUA). This EUA will remain in effect (meaning this test can be used) for the  duration of the COVID-19 declaration under Section 564(b)(1) of the Act, 21 U.S.C. section 360bbb-3(b)(1), unless the authorization is terminated or revoked.     Resp Syncytial Virus by PCR NEGATIVE NEGATIVE Final    Comment: (NOTE) Fact Sheet for Patients: EntrepreneurPulse.com.au  Fact Sheet for Healthcare Providers: IncredibleEmployment.be  This test is not yet approved or cleared by the [place] FDA and has been authorized for detection and/or diagnosis of SARS-CoV-2 by FDA under an Emergency Use Authorization (EUA). This EUA will remain in effect (meaning this test can be used) for the duration of the COVID-19 declaration under Section 564(b)(1) of the Act, 21 U.S.C. section 360bbb-3(b)(1), unless the authorization is terminated or revoked.  Performed at [facility], [place] [address] [place], [place] [address]   MRSA Next Gen by PCR, Nasal     Status: None   Collection Time: 08/11/22  8:26 PM   Specimen: Nasal Mucosa; Nasal Swab  Result Value Ref Range Status   MRSA by PCR Next Gen NOT DETECTED NOT DETECTED Final    Comment: (NOTE) The GeneXpert MRSA Assay (FDA approved for NASAL specimens only), is one component of a comprehensive MRSA colonization surveillance program. It is not intended to diagnose MRSA infection nor to guide or monitor treatment for MRSA infections. Test performance is not FDA approved in patients less than 36 years old. Performed at [facility], [place] [address], [place], [place] [address]          Radiology Studies: US Abdomen Limited RUQ (LIVER/GB)  Result Date: 08/12/2022 CLINICAL DATA:  Bilirubinemia. EXAM: ULTRASOUND ABDOMEN LIMITED RIGHT UPPER QUADRANT COMPARISON:  September 08, 2020 FINDINGS: Gallbladder: The gallbladder is surgically absent. Common bile duct: Diameter: 6.52 mm Liver: No focal lesion identified. The liver parenchyma is coarse in echotexture and diffusely decreased in  echogenicity. Portal vein is patent on color Doppler imaging with normal direction of blood flow towards the liver. Other: None. IMPRESSION: 1. Findings consistent with history of prior cholecystectomy. 2. Findings suggestive of hepatic cirrhosis without focal liver lesions. Electronically Signed   By: Virgina Norfolk M.D.   On: 08/12/2022 00:20   DG CHEST PORT 1 VIEW  Result Date: 08/11/2022 CLINICAL DATA:  Heart block EXAM: PORTABLE CHEST 1 VIEW COMPARISON:  X-ray 08/11/2022 and older.  CT 08/10/2022 FINDINGS: Overlapping cardiac leads and defibrillator pads. There is a right IJ line with tip extending to the right ventricle. Possible pacemaker lead. Enlarged cardiopericardial silhouette with calcified aorta. Vascular congestion. No pneumothorax or effusion. No consolidation. IMPRESSION: Presumed placement of a ventricular pacemaker lead via the right IJ. No pneumothorax. Electronically Signed   By: Jill Side M.D.   On: 08/11/2022 21:15  CARDIAC CATHETERIZATION  Result Date: 08/11/2022 Images from the original result were not included. Temporary transvenous pacemaker implantation 08/11/2022: Procedure performed: Ultrasound-guided access of the right internal jugular vein and placement of a 6 French sheath, placement of a temporary transvenous pacemaker into the RV apex. Pacemaker; pacemaker is at 40 cm.  Capture was obtained at <0.5 mm.  Pacemaker set at 60 bpm at 10 mA, VVI mode. Adrian Prows, MD, [facility] 08/11/2022, 8:15 PM Office: [phone] Fax: [phone] Pager: [phone]   DG Chest Portable 1 View  Result Date: 08/11/2022 CLINICAL DATA:  Fall with chest compression EXAM: PORTABLE CHEST 1 VIEW COMPARISON:  Yesterday FINDINGS: Stable heart size and mediastinal contours. Mild interstitial coarsening at the bases. There is no edema, consolidation, effusion, or pneumothorax. IMPRESSION: No acute or interval finding. Electronically Signed   By: Jorje Guild M.D.   On: 08/11/2022 06:20   CT  Head Wo Contrast  Result Date: 08/10/2022 CLINICAL DATA:  Fall, hit head on floor, hematoma to back of head EXAM: CT HEAD WITHOUT CONTRAST CT CERVICAL SPINE WITHOUT CONTRAST TECHNIQUE: Multidetector CT imaging of the head and cervical spine was performed following the standard protocol without intravenous contrast. Multiplanar CT image reconstructions of the cervical spine were also generated. RADIATION DOSE REDUCTION: This exam was performed according to the departmental dose-optimization program which includes automated exposure control, adjustment of the mA and/or kV according to patient size and/or use of iterative reconstruction technique. COMPARISON:  None Available. FINDINGS: CT HEAD FINDINGS Brain: No evidence of acute infarction, hemorrhage, hydrocephalus, extra-axial collection or mass lesion/mass effect. Periventricular and deep white matter hypodensity. Vascular: No hyperdense vessel or unexpected calcification. Skull: Normal. Negative for fracture or focal lesion. Sinuses/Orbits: No acute finding. Other: Soft tissue contusion of the scalp vertex (series 4, image 8). CT CERVICAL SPINE FINDINGS Alignment: Normal. Skull base and vertebrae: No acute fracture. No primary bone lesion or focal pathologic process. Soft tissues and spinal canal: No prevertebral fluid or swelling. No visible canal hematoma. Disc levels: Focally moderate disc space height loss and osteophytosis of C5-C7 with otherwise preserved disc spaces. Upper chest: Negative. Other: None. IMPRESSION: 1. No acute intracranial pathology. Small-vessel white matter disease. 2. Soft tissue contusion of the scalp vertex. 3. No fracture or static subluxation of the cervical spine. Electronically Signed   By: Delanna Ahmadi M.D.   On: 08/10/2022 19:20   CT Cervical Spine Wo Contrast  Result Date: 08/10/2022 CLINICAL DATA:  Fall, hit head on floor, hematoma to back of head EXAM: CT HEAD WITHOUT CONTRAST CT CERVICAL SPINE WITHOUT CONTRAST  TECHNIQUE: Multidetector CT imaging of the head and cervical spine was performed following the standard protocol without intravenous contrast. Multiplanar CT image reconstructions of the cervical spine were also generated. RADIATION DOSE REDUCTION: This exam was performed according to the departmental dose-optimization program which includes automated exposure control, adjustment of the mA and/or kV according to patient size and/or use of iterative reconstruction technique. COMPARISON:  None Available. FINDINGS: CT HEAD FINDINGS Brain: No evidence of acute infarction, hemorrhage, hydrocephalus, extra-axial collection or mass lesion/mass effect. Periventricular and deep white matter hypodensity. Vascular: No hyperdense vessel or unexpected calcification. Skull: Normal. Negative for fracture or focal lesion. Sinuses/Orbits: No acute finding. Other: Soft tissue contusion of the scalp vertex (series 4, image 8). CT CERVICAL SPINE FINDINGS Alignment: Normal. Skull base and vertebrae: No acute fracture. No primary bone lesion or focal pathologic process. Soft tissues and spinal canal: No prevertebral fluid or swelling. No visible canal hematoma. Disc  levels: Focally moderate disc space height loss and osteophytosis of C5-C7 with otherwise preserved disc spaces. Upper chest: Negative. Other: None. IMPRESSION: 1. No acute intracranial pathology. Small-vessel white matter disease. 2. Soft tissue contusion of the scalp vertex. 3. No fracture or static subluxation of the cervical spine. Electronically Signed   By: Delanna Ahmadi M.D.   On: 08/10/2022 19:20   CT CHEST ABDOMEN PELVIS W CONTRAST  Result Date: 08/10/2022 CLINICAL DATA:  Fall, syncope, hit head, complains of thoracic back pain to palpation, history of prostate cancer * Tracking Code: BO * EXAM: CT CHEST, ABDOMEN, AND PELVIS WITH CONTRAST CT THORACIC AND LUMBAR SPINE WITH CONTRAST TECHNIQUE: Multidetector CT imaging of the chest, abdomen and pelvis was performed  following the standard protocol during bolus administration of intravenous contrast. Multidetector CT imaging of the thoracic and lumbar spine was performed following the standard protocol during bolus administration of intravenous contrast. RADIATION DOSE REDUCTION: This exam was performed according to the departmental dose-optimization program which includes automated exposure control, adjustment of the mA and/or kV according to patient size and/or use of iterative reconstruction technique. CONTRAST:  16m OMNIPAQUE IOHEXOL 350 MG/ML SOLN COMPARISON:  CT abdomen pelvis, 10/18/2020 FINDINGS: CT CHEST FINDINGS Cardiovascular: Aortic atherosclerosis. Dense aortic valve calcifications. Normal heart size. Three-vessel coronary artery calcifications. Very dense mitral annulus calcifications. No pericardial effusion. Mediastinum/Nodes: No enlarged mediastinal, hilar, or axillary lymph nodes. Small hiatal hernia. Thyroid gland, trachea, and esophagus demonstrate no significant findings. Lungs/Pleura: Mild paraseptal emphysema. Diffuse bilateral bronchial wall thickening. Dependent bibasilar scarring and or atelectasis. No pleural effusion or pneumothorax. Musculoskeletal: No chest wall mass or suspicious osseous lesions identified. CT ABDOMEN PELVIS FINDINGS Hepatobiliary: No solid liver abnormality is seen. Small benign left lobe liver cysts, for which no further follow-up or characterization is required. Coarse nodular cirrhotic morphology of the liver. Status post cholecystectomy. No biliary ductal dilatation. Pancreas: Unremarkable. No pancreatic ductal dilatation or surrounding inflammatory changes. Spleen: Splenomegaly, maximum coronal span 15.0 cm. Adrenals/Urinary Tract: Adrenal glands are unremarkable. Severely atrophic right kidney with a small nonobstructive calculus of the midportion. Left kidney is normal. No ureteral calculi or hydronephrosis. Bladder is unremarkable. Stomach/Bowel: Stomach is within normal  limits. Appendix appears normal. No evidence of bowel wall thickening, distention, or inflammatory changes. Descending and sigmoid diverticulosis. Vascular/Lymphatic: Aortic atherosclerosis. 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 No enlarged abdominal or pelvic lymph nodes. Reproductive: Status post prostatectomy. Other: Small fat containing bilateral inguinal hernias.  No ascites. Musculoskeletal: No acute osseous findings. CT THORACIC AND LUMBAR SPINE FINDINGS Alignment: Gentle dextroscoliosis of the thoracic spine, apex T6, with otherwise normal thoracic kyphosis. Normal lumbar lordosis. Vertebral bodies: Osteopenia. Probable very subtle superior endplate deformity of L1 with no significant anterior height loss (series 6, image 34). Unchanged, chronic Schmorl deformity of the superior endplate of L3 (series 6, image 41). No fracture or dislocation. Disc spaces: Mild multilevel disc space height loss and osteophytosis throughout the thoracic and lumbar spine, with bridging osteophytosis of the mid to lower thoracic spine in keeping with DISH. Paraspinous soft tissues: Unremarkable. IMPRESSION: 1. No CT evidence of acute traumatic injury to the chest, abdomen, or pelvis. 2. Probable very subtle superior endplate deformity of L1 with no significant anterior height loss. Correlate for acute point tenderness. 3. Osteopenia. 4. Coronary artery disease. 5. Dense aortic valve calcifications. Correlate for echocardiographic evidence of aortic valve dysfunction. 6. Cirrhosis and splenomegaly. 7. Severely atrophic right kidney with a small nonobstructive calculus of the  midportion. 8. Descending and sigmoid diverticulosis without evidence of acute diverticulitis. 9. Status post prostatectomy. No evidence of lymphadenopathy or metastatic disease in the abdomen or pelvis. 10. Aortic atherosclerosis. 11. Emphysema. Aortic Atherosclerosis (ICD10-I70.0) and Emphysema (ICD10-J43.9).  Electronically Signed   By: Delanna Ahmadi M.D.   On: 08/10/2022 19:16   CT L-SPINE NO CHARGE  Result Date: 08/10/2022 CLINICAL DATA:  Fall, syncope, hit head, complains of thoracic back pain to palpation, history of prostate cancer * Tracking Code: BO * EXAM: CT CHEST, ABDOMEN, AND PELVIS WITH CONTRAST CT THORACIC AND LUMBAR SPINE WITH CONTRAST TECHNIQUE: Multidetector CT imaging of the chest, abdomen and pelvis was performed following the standard protocol during bolus administration of intravenous contrast. Multidetector CT imaging of the thoracic and lumbar spine was performed following the standard protocol during bolus administration of intravenous contrast. RADIATION DOSE REDUCTION: This exam was performed according to the departmental dose-optimization program which includes automated exposure control, adjustment of the mA and/or kV according to patient size and/or use of iterative reconstruction technique. CONTRAST:  65m OMNIPAQUE IOHEXOL 350 MG/ML SOLN COMPARISON:  CT abdomen pelvis, 10/18/2020 FINDINGS: CT CHEST FINDINGS Cardiovascular: Aortic atherosclerosis. Dense aortic valve calcifications. Normal heart size. Three-vessel coronary artery calcifications. Very dense mitral annulus calcifications. No pericardial effusion. Mediastinum/Nodes: No enlarged mediastinal, hilar, or axillary lymph nodes. Small hiatal hernia. Thyroid gland, trachea, and esophagus demonstrate no significant findings. Lungs/Pleura: Mild paraseptal emphysema. Diffuse bilateral bronchial wall thickening. Dependent bibasilar scarring and or atelectasis. No pleural effusion or pneumothorax. Musculoskeletal: No chest wall mass or suspicious osseous lesions identified. CT ABDOMEN PELVIS FINDINGS Hepatobiliary: No solid liver abnormality is seen. Small benign left lobe liver cysts, for which no further follow-up or characterization is required. Coarse nodular cirrhotic morphology of the liver. Status post cholecystectomy. No biliary  ductal dilatation. Pancreas: Unremarkable. No pancreatic ductal dilatation or surrounding inflammatory changes. Spleen: Splenomegaly, maximum coronal span 15.0 cm. Adrenals/Urinary Tract: Adrenal glands are unremarkable. Severely atrophic right kidney with a small nonobstructive calculus of the midportion. Left kidney is normal. No ureteral calculi or hydronephrosis. Bladder is unremarkable. Stomach/Bowel: Stomach is within normal limits. Appendix appears normal. No evidence of bowel wall thickening, distention, or inflammatory changes. Descending and sigmoid diverticulosis. Vascular/Lymphatic: Aortic atherosclerosis. 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 No enlarged abdominal or pelvic lymph nodes. Reproductive: Status post prostatectomy. Other: Small fat containing bilateral inguinal hernias.  No ascites. Musculoskeletal: No acute osseous findings. CT THORACIC AND LUMBAR SPINE FINDINGS Alignment: Gentle dextroscoliosis of the thoracic spine, apex T6, with otherwise normal thoracic kyphosis. Normal lumbar lordosis. Vertebral bodies: Osteopenia. Probable very subtle superior endplate deformity of L1 with no significant anterior height loss (series 6, image 34). Unchanged, chronic Schmorl deformity of the superior endplate of L3 (series 6, image 41). No fracture or dislocation. Disc spaces: Mild multilevel disc space height loss and osteophytosis throughout the thoracic and lumbar spine, with bridging osteophytosis of the mid to lower thoracic spine in keeping with DISH. Paraspinous soft tissues: Unremarkable. IMPRESSION: 1. No CT evidence of acute traumatic injury to the chest, abdomen, or pelvis. 2. Probable very subtle superior endplate deformity of L1 with no significant anterior height loss. Correlate for acute point tenderness. 3. Osteopenia. 4. Coronary artery disease. 5. Dense aortic valve calcifications. Correlate for echocardiographic evidence of aortic valve  dysfunction. 6. Cirrhosis and splenomegaly. 7. Severely atrophic right kidney with a small nonobstructive calculus of the midportion. 8. Descending and sigmoid diverticulosis without evidence of acute diverticulitis. 9.  Status post prostatectomy. No evidence of lymphadenopathy or metastatic disease in the abdomen or pelvis. 10. Aortic atherosclerosis. 11. Emphysema. Aortic Atherosclerosis (ICD10-I70.0) and Emphysema (ICD10-J43.9). Electronically Signed   By: Delanna Ahmadi M.D.   On: 08/10/2022 19:16   CT T-SPINE NO CHARGE  Result Date: 08/10/2022 CLINICAL DATA:  Fall, syncope, hit head, complains of thoracic back pain to palpation, history of prostate cancer * Tracking Code: BO * EXAM: CT CHEST, ABDOMEN, AND PELVIS WITH CONTRAST CT THORACIC AND LUMBAR SPINE WITH CONTRAST TECHNIQUE: Multidetector CT imaging of the chest, abdomen and pelvis was performed following the standard protocol during bolus administration of intravenous contrast. Multidetector CT imaging of the thoracic and lumbar spine was performed following the standard protocol during bolus administration of intravenous contrast. RADIATION DOSE REDUCTION: This exam was performed according to the departmental dose-optimization program which includes automated exposure control, adjustment of the mA and/or kV according to patient size and/or use of iterative reconstruction technique. CONTRAST:  54m OMNIPAQUE IOHEXOL 350 MG/ML SOLN COMPARISON:  CT abdomen pelvis, 10/18/2020 FINDINGS: CT CHEST FINDINGS Cardiovascular: Aortic atherosclerosis. Dense aortic valve calcifications. Normal heart size. Three-vessel coronary artery calcifications. Very dense mitral annulus calcifications. No pericardial effusion. Mediastinum/Nodes: No enlarged mediastinal, hilar, or axillary lymph nodes. Small hiatal hernia. Thyroid gland, trachea, and esophagus demonstrate no significant findings. Lungs/Pleura: Mild paraseptal emphysema. Diffuse bilateral bronchial wall  thickening. Dependent bibasilar scarring and or atelectasis. No pleural effusion or pneumothorax. Musculoskeletal: No chest wall mass or suspicious osseous lesions identified. CT ABDOMEN PELVIS FINDINGS Hepatobiliary: No solid liver abnormality is seen. Small benign left lobe liver cysts, for which no further follow-up or characterization is required. Coarse nodular cirrhotic morphology of the liver. Status post cholecystectomy. No biliary ductal dilatation. Pancreas: Unremarkable. No pancreatic ductal dilatation or surrounding inflammatory changes. Spleen: Splenomegaly, maximum coronal span 15.0 cm. Adrenals/Urinary Tract: Adrenal glands are unremarkable. Severely atrophic right kidney with a small nonobstructive calculus of the midportion. Left kidney is normal. No ureteral calculi or hydronephrosis. Bladder is unremarkable. Stomach/Bowel: Stomach is within normal limits. Appendix appears normal. No evidence of bowel wall thickening, distention, or inflammatory changes. Descending and sigmoid diverticulosis. Vascular/Lymphatic: Aortic atherosclerosis. 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 No enlarged abdominal or pelvic lymph nodes. Reproductive: Status post prostatectomy. Other: Small fat containing bilateral inguinal hernias.  No ascites. Musculoskeletal: No acute osseous findings. CT THORACIC AND LUMBAR SPINE FINDINGS Alignment: Gentle dextroscoliosis of the thoracic spine, apex T6, with otherwise normal thoracic kyphosis. Normal lumbar lordosis. Vertebral bodies: Osteopenia. Probable very subtle superior endplate deformity of L1 with no significant anterior height loss (series 6, image 34). Unchanged, chronic Schmorl deformity of the superior endplate of L3 (series 6, image 41). No fracture or dislocation. Disc spaces: Mild multilevel disc space height loss and osteophytosis throughout the thoracic and lumbar spine, with bridging osteophytosis of the mid to lower  thoracic spine in keeping with DISH. Paraspinous soft tissues: Unremarkable. IMPRESSION: 1. No CT evidence of acute traumatic injury to the chest, abdomen, or pelvis. 2. Probable very subtle superior endplate deformity of L1 with no significant anterior height loss. Correlate for acute point tenderness. 3. Osteopenia. 4. Coronary artery disease. 5. Dense aortic valve calcifications. Correlate for echocardiographic evidence of aortic valve dysfunction. 6. Cirrhosis and splenomegaly. 7. Severely atrophic right kidney with a small nonobstructive calculus of the midportion. 8. Descending and sigmoid diverticulosis without evidence of acute diverticulitis. 9. Status post prostatectomy. No evidence of lymphadenopathy or metastatic disease in the abdomen  or pelvis. 10. Aortic atherosclerosis. 11. Emphysema. Aortic Atherosclerosis (ICD10-I70.0) and Emphysema (ICD10-J43.9). Electronically Signed   By: Delanna Ahmadi M.D.   On: 08/10/2022 19:16   DG Pelvis Portable  Result Date: 08/10/2022 CLINICAL DATA:  Fall, right-sided hip pain EXAM: PORTABLE PELVIS 1-2 VIEWS COMPARISON:  None Available. FINDINGS: Osteopenia. There is no evidence of displaced pelvic fracture or diastasis. No pelvic bone lesions are seen. IMPRESSION: Osteopenia. No displaced fracture of the pelvis or bilateral proximal femurs seen in single frontal view. Please note that plain radiographs are significantly insensitive for hip and pelvic fracture. Recommend CT or MRI to more sensitively evaluate if there is high clinical suspicion for fracture. Electronically Signed   By: Delanna Ahmadi M.D.   On: 08/10/2022 17:11   DG Chest Portable 1 View  Result Date: 08/10/2022 CLINICAL DATA:  Back and right-sided chest pain following recent fall, initial encounter EXAM: PORTABLE CHEST 1 VIEW COMPARISON:  11/10/2020 FINDINGS: The heart size and mediastinal contours are within normal limits. Both lungs are clear. Bony structures show a vague lucency in the superior  aspect of the scapula. An undisplaced fracture cannot be totally excluded given the patient's clinical history. IMPRESSION: No focal infiltrate noted. Changes suspicious for right scapular fracture. Correlate to point tenderness. Cross-sectional imaging may be helpful as clinically indicated. Electronically Signed   By: Inez Catalina M.D.   On: 08/10/2022 17:10     Scheduled Meds:  Chlorhexidine Gluconate Cloth  6 each Topical Daily   perflutren lipid microspheres (DEFINITY) IV suspension       thyroid  120 mg Oral Daily   Continuous Infusions:  sodium chloride 50 mL/hr at 08/12/22 0711     LOS: 1 day    [facility]  If 7PM-7AM, please contact night-coverage [email or web address]  08/12/2022, 9:21 AM

## 2022-08-12 NOTE — Interval H&P Note (Signed)
History and Physical Interval Note:  08/12/2022 10:14 AM  [name]  has presented today for surgery, with the diagnosis of complete heart block.  The various methods of treatment have been discussed with the patient and family. After consideration of risks, benefits and other options for treatment, the patient has consented to  Procedure(s): PACEMAKER IMPLANT (N/A) as a surgical intervention.  The patient's history has been reviewed, patient examined, no change in status, stable for surgery.  I have reviewed the patient's chart and labs.  Questions were answered to the patient's satisfaction.     Evelin Cake [employer]

## 2022-08-12 NOTE — Progress Notes (Signed)
OT Cancellation Note  Patient Details Name: [name] MRN: [id number] DOB: 26-Jul-1933   Cancelled Treatment:    Reason Eval/Treat Not Completed: Other (comment) (Pt with plans for pacemaker implant today. OT evaluation to f/u post-op.)  Elliot Cousin 08/12/2022, 8:58 AM

## 2022-08-12 NOTE — H&P (View-Only) (Signed)
Electrophysiology Rounding Note  Patient Name: [name] Date of Encounter: 08/12/2022  Primary Cardiologist: Peter Martinique, MD Electrophysiologist: New   Subjective   Events from overnight noted.   Now with TVP in place, pacing at 60.     Confused this am.   Inpatient Medications    Scheduled Meds:  Chlorhexidine Gluconate Cloth  6 each Topical Daily   thyroid  120 mg Oral Daily   Continuous Infusions:  sodium chloride 50 mL/hr at 08/12/22 0711   PRN Meds: acetaminophen, hydrALAZINE, mouth rinse, oxyCODONE, polyethylene glycol, prochlorperazine   Vital Signs    Vitals:   08/12/22 0358 08/12/22 0400 08/12/22 0500 08/12/22 0600  BP:  (!) 156/66 (!) 154/61 (!) 162/59  Pulse:  60 60 61  Resp:  (!) 21 20 (!) 24  Temp:  99 F (37.2 C)    TempSrc:  Axillary    SpO2:  96% 94% 96%  Weight: 92.2 kg     Height:        Intake/Output Summary (Last 24 hours) at 08/12/2022 0732 Last data filed at 08/12/2022 0600 Gross per 24 hour  Intake 334.38 ml  Output 400 ml  Net -65.62 ml   Filed Weights   08/10/22 1633 08/12/22 0358  Weight: 91.6 kg 92.2 kg    Physical Exam    GEN- The patient is encephalopathic. Responds to voice.  HEENT- No gross abnormality.  Lungs- Somewhat diminished. normal work of breathing Heart- Regular rate and rhythm, 2/6 AS murmur GI- soft, NT, ND, + BS Extremities- no clubbing or cyanosis. No edema Neuro- No obvious focal abnormality.   Telemetry    Further pauses overnight now s/p TVP at 60 (personally reviewed)   Patient Profile     JOQUAN LOTZ is a 89 y.o. male with a history of CAD s/p stenting of RCA 2017, Prostate CA s/p radical prostatectomy 1998, HTN, HLD, h/o carotid artery disease s/p L CEA 1998, PAF (no longer on [place]), GI bleeding, chronic balance issues, and TIA who is being seen today for the evaluation of ventricular standstill.   Assessment & Plan    Intermittent complete heart block Conduction worsened overnight  required TVP.  Discussed PPM risks and benefits with pt and his wife at length yesterday, who had agreed to proceed.  Tentatively plan pacing today.   2. Confusion Alert but not oriented. ? How far from baseline/sundowning, or if precipitated by events overnight.  Team overnight had mentioned repeat head imaging if did not improve; Jensen Kilburg defer to primary. CT on arrival OK, no intracranial trauma + soft contusion of the scalp  3. Aortic stenosis At least Mild/Mod, Echo pending Avondre Richens need pacing regardless.   For questions or updates, please contact [place] Please consult [email or web address] for contact info under Cardiology/STEMI.  Signed, Shirley Friar, PA-C  08/12/2022, 7:32 AM   I have seen and examined this patient with Oda Kilts.  Agree with above, note added to reflect my findings.  Continue to have pauses overnight.  Status post temp wire placement.  Plan for pacemaker implant today.  GEN: Well nourished, well developed, in no acute distress  HEENT: normal  Neck: no JVD, carotid bruits, or masses Cardiac: RRR; no murmurs, rubs, or gallops,no edema  Respiratory:  clear to auscultation bilaterally, normal work of breathing GI: soft, nontender, nondistended, + BS MS: no deformity or atrophy  Skin: warm and dry Neuro:  Strength and sensation are intact Psych: euthymic mood, full affect   Complete  heart block: Status post temporary wire placement yesterday.  No further episodes of syncope since temp wire was implanted.  Risk and benefits of pacemaker were discussed.  Risk include bleeding, tamponade, infection, pneumothorax, lead dislodgment.  He understands the risks and is agreed to the procedure.  Clemens Lachman M. Kennah Hehr MD 08/12/2022 10:13 AM

## 2022-08-12 NOTE — Progress Notes (Addendum)
Electrophysiology Rounding Note  Patient Name: [name] Date of Encounter: 08/12/2022  Primary Cardiologist: Peter Martinique, MD Electrophysiologist: New   Subjective   Events from overnight noted.   Now with TVP in place, pacing at 60.     Confused this am.   Inpatient Medications    Scheduled Meds:  Chlorhexidine Gluconate Cloth  6 each Topical Daily   thyroid  120 mg Oral Daily   Continuous Infusions:  sodium chloride 50 mL/hr at 08/12/22 0711   PRN Meds: acetaminophen, hydrALAZINE, mouth rinse, oxyCODONE, polyethylene glycol, prochlorperazine   Vital Signs    Vitals:   08/12/22 0358 08/12/22 0400 08/12/22 0500 08/12/22 0600  BP:  (!) 156/66 (!) 154/61 (!) 162/59  Pulse:  60 60 61  Resp:  (!) 21 20 (!) 24  Temp:  99 F (37.2 C)    TempSrc:  Axillary    SpO2:  96% 94% 96%  Weight: 92.2 kg     Height:        Intake/Output Summary (Last 24 hours) at 08/12/2022 0732 Last data filed at 08/12/2022 0600 Gross per 24 hour  Intake 334.38 ml  Output 400 ml  Net -65.62 ml   Filed Weights   08/10/22 1633 08/12/22 0358  Weight: 91.6 kg 92.2 kg    Physical Exam    GEN- The patient is encephalopathic. Responds to voice.  HEENT- No gross abnormality.  Lungs- Somewhat diminished. normal work of breathing Heart- Regular rate and rhythm, 2/6 AS murmur GI- soft, NT, ND, + BS Extremities- no clubbing or cyanosis. No edema Neuro- No obvious focal abnormality.   Telemetry    Further pauses overnight now s/p TVP at 60 (personally reviewed)   Patient Profile     [name] is a 89 y.o. male with a history of CAD s/p stenting of RCA 2017, Prostate CA s/p radical prostatectomy 1998, HTN, HLD, h/o carotid artery disease s/p L CEA 1998, PAF (no longer on [place]), GI bleeding, chronic balance issues, and TIA who is being seen today for the evaluation of ventricular standstill.   Assessment & Plan    Intermittent complete heart block Conduction worsened overnight  required TVP.  Discussed PPM risks and benefits with pt and his wife at length yesterday, who had agreed to proceed.  Tentatively plan pacing today.   2. Confusion Alert but not oriented. ? How far from baseline/sundowning, or if precipitated by events overnight.  Team overnight had mentioned repeat head imaging if did not improve; Desare Duddy defer to primary. CT on arrival OK, no intracranial trauma + soft contusion of the scalp  3. Aortic stenosis At least Mild/Mod, Echo pending Shakora Nordquist need pacing regardless.   For questions or updates, please contact [place] Please consult [email or web address] for contact info under Cardiology/STEMI.  Signed, Shirley Friar, PA-C  08/12/2022, 7:32 AM   I have seen and examined this patient with [name].  Agree with above, note added to reflect my findings.  Continue to have pauses overnight.  Status post temp wire placement.  Plan for pacemaker implant today.  GEN: Well nourished, well developed, in no acute distress  HEENT: normal  Neck: no JVD, carotid bruits, or masses Cardiac: RRR; no murmurs, rubs, or gallops,no edema  Respiratory:  clear to auscultation bilaterally, normal work of breathing GI: soft, nontender, nondistended, + BS MS: no deformity or atrophy  Skin: warm and dry Neuro:  Strength and sensation are intact Psych: euthymic mood, full affect   Complete  heart block: Status post temporary wire placement yesterday.  No further episodes of syncope since temp wire was implanted.  Risk and benefits of pacemaker were discussed.  Risk include bleeding, tamponade, infection, pneumothorax, lead dislodgment.  He understands the risks and is agreed to the procedure.  Liann Spaeth M. Riyah Bardon MD 08/12/2022 10:13 AM

## 2022-08-12 NOTE — Progress Notes (Signed)
Removed Pt introducer. Introducer halfway out. No sutures on introducer. Dressing applied per protocol.

## 2022-08-12 NOTE — Progress Notes (Signed)
Echocardiogram 2D Echocardiogram has been performed.  [name] 08/12/2022, 9:29 AM

## 2022-08-13 ENCOUNTER — Inpatient Hospital Stay (HOSPITAL_COMMUNITY): Payer: Medicare Other

## 2022-08-13 DIAGNOSIS — I480 Paroxysmal atrial fibrillation: Secondary | ICD-10-CM | POA: Diagnosis not present

## 2022-08-13 DIAGNOSIS — N1832 Chronic kidney disease, stage 3b: Secondary | ICD-10-CM | POA: Diagnosis not present

## 2022-08-13 DIAGNOSIS — I441 Atrioventricular block, second degree: Secondary | ICD-10-CM | POA: Diagnosis not present

## 2022-08-13 LAB — CBC
HCT: 37 % — ABNORMAL LOW (ref 39.0–52.0)
Hemoglobin: 13.1 g/dL (ref 13.0–17.0)
MCH: 36.3 pg — ABNORMAL HIGH (ref 26.0–34.0)
MCHC: 35.4 g/dL (ref 30.0–36.0)
MCV: 102.5 fL — ABNORMAL HIGH (ref 80.0–100.0)
Platelets: 98 10*3/uL — ABNORMAL LOW (ref 150–400)
RBC: 3.61 MIL/uL — ABNORMAL LOW (ref 4.22–5.81)
RDW: 15.5 % (ref 11.5–15.5)
WBC: 8.3 10*3/uL (ref 4.0–10.5)
nRBC: 0 % (ref 0.0–0.2)

## 2022-08-13 LAB — GLUCOSE, CAPILLARY
Glucose-Capillary: 132 mg/dL — ABNORMAL HIGH (ref 70–99)
Glucose-Capillary: 95 mg/dL (ref 70–99)
Glucose-Capillary: 154 mg/dL — ABNORMAL HIGH (ref 70–99)
Glucose-Capillary: 126 mg/dL — ABNORMAL HIGH (ref 70–99)

## 2022-08-13 LAB — COMPREHENSIVE METABOLIC PANEL
ALT: 20 U/L (ref 0–44)
AST: 34 U/L (ref 15–41)
Albumin: 3.1 g/dL — ABNORMAL LOW (ref 3.5–5.0)
Alkaline Phosphatase: 74 U/L (ref 38–126)
Anion gap: 10 (ref 5–15)
BUN: 22 mg/dL (ref 8–23)
CO2: 21 mmol/L — ABNORMAL LOW (ref 22–32)
Calcium: 8.7 mg/dL — ABNORMAL LOW (ref 8.9–10.3)
Chloride: 106 mmol/L (ref 98–111)
Creatinine, Ser: 1.59 mg/dL — ABNORMAL HIGH (ref 0.61–1.24)
GFR, Estimated: 42 mL/min — ABNORMAL LOW (ref >60)
Glucose, Bld: 129 mg/dL — ABNORMAL HIGH (ref 70–99)
Potassium: 3.8 mmol/L (ref 3.5–5.1)
Sodium: 137 mmol/L (ref 135–145)
Total Bilirubin: 3.1 mg/dL — ABNORMAL HIGH (ref 0.3–1.2)
Total Protein: 6.4 g/dL — ABNORMAL LOW (ref 6.5–8.1)

## 2022-08-13 LAB — MAGNESIUM: Magnesium: 1.9 mg/dL (ref 1.7–2.4)

## 2022-08-13 MED ORDER — POTASSIUM CHLORIDE CRYS ER 20 MEQ PO TBCR
40.0000 meq | EXTENDED_RELEASE_TABLET | Freq: Once | ORAL | Status: AC
  Administered 2022-08-13: 40 meq via ORAL
  Filled 2022-08-13: qty 2

## 2022-08-13 NOTE — Progress Notes (Signed)
  Soft, moderate sized hematoma noted this am when instructions given to pt and family.   On re-evaluation, had improved, but as pt is staying the night I applied pressure dressing and will check again tomorrow.   Legrand Como [address]" [employer], PA-C  08/13/2022 4:05 PM

## 2022-08-13 NOTE — Evaluation (Signed)
Physical Therapy Evaluation Patient Details Name: [name] MRN: [id number] DOB: [birth year] Today's Date: 08/13/2022  History of Present Illness  [name] is an 89 y.o. male who presented 08/10/22 to the emergency department after 2 syncope episodes in 24 hours. CT head without acute intracranial findings. 2x ventricular standstill/pauses noted since admission 20-30 seconds. Temp pacer placed 1/30. S/P PPM 1/31. PMH: TIA, hypertension, hyperlipidemia, caroitd artery occlusion, CAD, prostate CA, tinnitus   Clinical Impression  Pt presents with condition above and deficits mentioned below, see PT Problem List. PTA, he was living with his wife in a split-level house with 2 short STE and x7 stairs to access his bedroom/bathroom. Pt was requiring intermittent minA for transfers and supervision to ambulate using cane, but intermittently was mod I. Currently, pt demonstrates deficits in cognition, balance, strength, and activity tolerance that place him at risk for subsequent falls. He reported feeling lightheaded when ambulating today and demonstrated increased shaking as mobility progressed, thereby limiting mobility distance today. Pt also reported extreme back pain with bed mobility, requiring maxAx2 to transition sit to supine today. Otherwise, pt was able to transfer to stand and ambulate in the room with a RW with minA. Hopefully pt progresses well as his pain improves and his confusion ideally improves, thus recommending follow-up with HHPT and family support upon d/c. However, if he does not progress as anticipated and family cannot manage him then may need to pursue other post-acute rehab options. Will continue to follow acutely.   BP:  162/75 sitting in chair start of session 142/75 standing 141/111 supine end of session      Recommendations for follow up therapy are one component of a multi-disciplinary discharge planning process, led by the attending physician.  Recommendations may be  updated based on patient status, additional functional criteria and insurance authorization.  Follow Up Recommendations Home health PT (provided family can assist as needed)      Assistance Recommended at Discharge Frequent or constant Supervision/Assistance  Patient can return home with the following  A lot of help with walking and/or transfers;A lot of help with bathing/dressing/bathroom;Assistance with cooking/housework;Direct supervision/assist for financial management;Direct supervision/assist for medications management;Assist for transportation;Help with stairs or ramp for entrance    Equipment Recommendations Rollator (4 wheels);BSC/3in1  Recommendations for Other Services       Functional Status Assessment Patient has had a recent decline in their functional status and demonstrates the ability to make significant improvements in function in a reasonable and predictable amount of time.     Precautions / Restrictions Precautions Precautions: Fall;ICD/Pacemaker Precaution Comments: reviewed with pt, requires cueing to adhere Restrictions Weight Bearing Restrictions: No Other Position/Activity Restrictions: pacemaker precautions L UE      Mobility  Bed Mobility Overal bed mobility: Needs Assistance Bed Mobility: Sit to Supine       Sit to supine: Max assist, +2 for physical assistance, +2 for safety/equipment, HOB elevated   General bed mobility comments: Pt needing maxAx2 to direct trunk to lay down and to lift legs onto bed, with pt demonstrating increased pain reportedly in his back.    Transfers Overall transfer level: Needs assistance Equipment used: Rolling walker (2 wheels) Transfers: Sit to/from Stand Sit to Stand: Min assist, +2 safety/equipment           General transfer comment: Pt needing modAx2 to scoot hips to edge of seat to allow feet to reach floor but then demonstrated good initiation to power up to stand from recliner using his  R UE to push up  from the chair as cued, minA to stand, +2 for safety.    Ambulation/Gait Ambulation/Gait assistance: Min assist, +2 safety/equipment Gait Distance (Feet): 18 Feet Assistive device: Rolling walker (2 wheels) Gait Pattern/deviations: Step-through pattern, Decreased stride length, Shuffle Gait velocity: reduced Gait velocity interpretation: <1.31 ft/sec, indicative of household ambulator   General Gait Details: Pt with slow, small, shuffling steps, needing minA for stability and to direct pt with RW. Pt often getting distracted or not hearing commands well for direction. Limited distance due to pt reporting feeling lightheaded mid gait bout. +2 for safety  Stairs            Wheelchair Mobility    Modified Rankin (Stroke Patients Only)       Balance Overall balance assessment: Needs assistance Sitting-balance support: No upper extremity supported, Feet supported Sitting balance-Leahy Scale: Fair     Standing balance support: Bilateral upper extremity supported, Reliant on assistive device for balance, During functional activity Standing balance-Leahy Scale: Poor Standing balance comment: Reliant on RW and external physical assistance                             Pertinent Vitals/Pain Pain Assessment Pain Assessment: Faces Faces Pain Scale: Hurts whole lot Pain Location: L chest at location of PPM; back Pain Descriptors / Indicators: Discomfort, Grimacing, Guarding, Moaning, Other (Comment) (Pt shaking end of session) Pain Intervention(s): Limited activity within patient's tolerance, Monitored during session, Repositioned, Patient requesting pain meds-RN notified    Home Living Family/patient expects to be discharged to:: Private residence Living Arrangements: Spouse/significant other Available Help at Discharge: Family;Available 24 hours/day Type of Home: House Home Access: Stairs to enter Entrance Stairs-Rails: None Entrance Stairs-Number of Steps:  2 Alternate Level Stairs-Number of Steps: 7 Home Layout: Multi-level;Bed/bath upstairs Home Equipment: Conservation officer, nature (2 wheels);Cane - single point      Prior Function Prior Level of Function : History of Falls (last six months);Needs assist             Mobility Comments: at times min assist for transfers using cane, intermittent supervision for mobility ADLs Comments: not driving, indep ADLs and med mgmt (not finances)     Hand Dominance        Extremity/Trunk Assessment   Upper Extremity Assessment Upper Extremity Assessment: Defer to OT evaluation    Lower Extremity Assessment Lower Extremity Assessment: Generalized weakness    Cervical / Trunk Assessment Cervical / Trunk Assessment: Other exceptions Cervical / Trunk Exceptions: back pain from recent fall  Communication   Communication: HOH  Cognition Arousal/Alertness: Awake/alert Behavior During Therapy: WFL for tasks assessed/performed Overall Cognitive Status: Impaired/Different from baseline Area of Impairment: Attention, Memory, Following commands, Safety/judgement, Awareness, Problem solving                   Current Attention Level: Sustained Memory: Decreased short-term memory, Decreased recall of precautions Following Commands: Follows one step commands consistently, Follows one step commands with increased time, Follows multi-step commands inconsistently Safety/Judgement: Decreased awareness of safety, Decreased awareness of deficits Awareness: Emergent Problem Solving: Slow processing, Decreased initiation, Difficulty sequencing, Requires verbal cues, Requires tactile cues General Comments: Pt A&O, reporting he was here due to back pain but then reported the back pain was due to falling from his syncope episodes.        General Comments General comments (skin integrity, edema, etc.): BP 162/75 sitting in chair start of session, 142/75  standing, 141/111 supine end of session; HR up to  100/110s    Exercises     Assessment/Plan    PT Assessment Patient needs continued PT services  PT Problem List Decreased strength;Decreased activity tolerance;Decreased balance;Decreased mobility;Decreased coordination;Decreased cognition;Decreased knowledge of use of DME;Decreased safety awareness;Decreased knowledge of precautions;Cardiopulmonary status limiting activity;Pain       PT Treatment Interventions DME instruction;Gait training;Stair training;Functional mobility training;Therapeutic activities;Therapeutic exercise;Balance training;Neuromuscular re-education;Cognitive remediation;Patient/family education    PT Goals (Current goals can be found in the Care Plan section)  Acute Rehab PT Goals Patient Stated Goal: to reduce back pain PT Goal Formulation: With patient/family Time For Goal Achievement: 08/27/22 Potential to Achieve Goals: Good    Frequency Min 3X/week     Co-evaluation PT/OT/SLP Co-Evaluation/Treatment: Yes Reason for Co-Treatment: Necessary to address cognition/behavior during functional activity;For patient/therapist safety;To address functional/ADL transfers PT goals addressed during session: Mobility/safety with mobility;Balance;Proper use of DME OT goals addressed during session: ADL's and self-care       AM-PAC PT "6 Clicks" Mobility  Outcome Measure Help needed turning from your back to your side while in a flat bed without using bedrails?: A Lot Help needed moving from lying on your back to sitting on the side of a flat bed without using bedrails?: Total Help needed moving to and from a bed to a chair (including a wheelchair)?: A Little Help needed standing up from a chair using your arms (e.g., wheelchair or bedside chair)?: A Little Help needed to walk in hospital room?: A Lot Help needed climbing 3-5 steps with a railing? : A Lot 6 Click Score: 13    End of Session Equipment Utilized During Treatment: Gait belt Activity Tolerance:  Patient limited by pain;Other (comment) (limited by being lightheaded) Patient left: in bed;with call bell/phone within reach;with bed alarm set;with family/visitor present Nurse Communication: Mobility status;Patient requests pain meds PT Visit Diagnosis: Unsteadiness on feet (R26.81);Other abnormalities of gait and mobility (R26.89);Muscle weakness (generalized) (M62.81);History of falling (Z91.81);Difficulty in walking, not elsewhere classified (R26.2);Dizziness and giddiness (R42);Pain Pain - Right/Left: Left Pain - part of body:  (chest; back)    Time: 1537-9432 PT Time Calculation (min) (ACUTE ONLY): 41 min   Charges:   PT Evaluation $PT Eval Moderate Complexity: 1 Mod PT Treatments $Therapeutic Activity: 8-22 mins        Moishe Spice, PT, DPT Acute Rehabilitation Services  Office: [phone]   Orvan Falconer 08/13/2022, 1:51 PM

## 2022-08-13 NOTE — Progress Notes (Signed)
Placed patient on CPAP for the night with oxygen set at 5lpm

## 2022-08-13 NOTE — Progress Notes (Addendum)
PROGRESS NOTE    [name]  WUJ:[id number] DOB: 07/31/1933 DOA: 08/10/2022 PCP: [name], MD   Brief Narrative:  [name] is a 89 y.o. male with medical history significant for chronic dizziness/balance issues for at least 2 years, coronary artery disease status post PCI with stenting, paroxysmal A-fib, previously on Xarelto and no longer on DOAC after GI bleeding episodes, prostate cancer status post radical prostatectomy in 1998, hypertension, hyperlipidemia with intolerance to statin, history of carotid artery disease status post left carotid endarterectomy in 1998, OSA on CPAP, bilateral sensorineural hearing dysfunction, and TIA, who presented to [facility] ED from home via EMS due to multiple syncopal episodes at home.  Patient indicates that he had no prodrome or symptoms other than noticing that the room had moved and that approximately halfway through his fall he notes loss of consciousness and awoke on the floor with his wife over him.  Loss of consciousness may be a few minutes, no postictal or confusion once aroused.  Given second fall where he struck his head on the kitchen floor the wife called EMS for transport to the hospital. Imaging unremarkable for fracture or bleed or acute findings.  Hospitalist called for admission.  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  This was followed by placement of permanent pacemaker on 1/31. Electrophysiology is following.  Seems to be stable from a cardiac standpoint. Mentation has improved compared to yesterday.  Elevation.   Elevated troponin, suspect demand ischemia in the setting of syncope Minimally elevated.  Likely due to demand ischemia.  Patient denies any chest pain. Echocardiogram showed LVEF to be 65 to 70%.  No regional wall motion abnormalities were noted.  Moderate aortic valve stenosis was seen.   Prolonged Qtc Continue to monitor  on telemetry.  Avoid QT prolonging medications.  Monitor electrolytes.  Replace potassium today.  Magnesium is 1.9.   Paroxysmal A-fib, previously on Xarelto  No longer on DOAC after history of GI bleeding episodes Not on rate control agents, rate is controlled Continue telemetry monitoring   Elevated AST, bilirubin secondary to history of liver cirrhosis Ultrasound suggested liver cirrhosis.  Outpatient management of same.  Thrombocytopenia Secondary to liver cirrhosis.  No evidence for bleeding currently.  Hypothyroidism Continue thyroid medications.   CKD 3B Seems to be close to baseline.  Monitor urine output.  Avoid nephrotoxic agents.   Chronic diastolic CHF Euvolemic on exam.   DVT prophylaxis: SCDs Code Status: Full Family Communication: No family at bedside Disposition: To be determined.  Status is: Inpatient Remains inpatient appropriate because: Second-degree heart block   Consultants:  Cardiology, PCCM  Procedures:  None  Antimicrobials:  None indicated  Subjective: Patient sitting up in the chair.  Mentation appears to have improved.  Answering questions appropriately though with some hesitation.  Denies any chest pain shortness of breath.     Objective: Vitals:   08/13/22 0745 08/13/22 0800 08/13/22 0801 08/13/22 0815  BP: (!) 140/72 (!) 156/72 (!) 156/72 (!) 127/53  Pulse: 92 94 98 92  Resp: (!) 22 (!) '22 15 16  '$ Temp:      TempSrc:      SpO2: 95% 96% 97% 96%  Weight:      Height:        Intake/Output Summary (Last 24 hours) at 08/13/2022 0916 Last data filed at 08/13/2022 0800 Gross per 24 hour  Intake 1298.37 ml  Output 850 ml  Net 448.37 ml    Filed Weights   08/10/22 1633 08/12/22 0358 08/13/22 0500  Weight: 91.6 kg 92.2 kg 92.1 kg    Examination:  General appearance: Awake alert.  In no distress Resp: Clear to auscultation bilaterally.  Normal effort Cardio: S1-S2 is normal regular.  No S3-S4.  Systolic murmur Over the  precordium. GI: Abdomen is soft.  Nontender nondistended.  Bowel sounds are present normal.  No masses organomegaly Extremities: No edema.  Full range of motion of lower extremities. Neurologic: He is alert.  Oriented to state year person.  No obvious focal neurological deficits.  Data Reviewed: I have personally reviewed following labs and imaging studies  CBC: Recent Labs  Lab 08/10/22 1645 08/11/22 0242 08/12/22 0107 08/13/22 0243  WBC 4.4 6.7 7.9 8.3  NEUTROABS 2.4  --   --   --   HGB 13.6 14.0 14.3 13.1  HCT 39.3 40.1 40.6 37.0*  MCV 102.3* 102.8* 101.5* 102.5*  PLT 95* 88* 99* 98*    Basic Metabolic Panel: Recent Labs  Lab 08/10/22 1645 08/11/22 0242 08/12/22 0107 08/13/22 0243  NA 138 136 137 137  K 3.6 4.0 4.5 3.8  CL 105 105 101 106  CO2 '24 22 27 '$ 21*  GLUCOSE 97 137* 133* 129*  BUN '13 13 16 22  '$ CREATININE 1.46* 1.36* 1.48* 1.59*  CALCIUM 8.8* 8.9 9.1 8.7*  MG  --  1.8  --  1.9  PHOS  --  3.0  --   --     GFR: Estimated Creatinine Clearance: 34.8 mL/min (A) (by C-G formula based on SCr of 1.59 mg/dL (H)). Liver Function Tests: Recent Labs  Lab 08/10/22 1645 08/11/22 0242 08/12/22 0107 08/13/22 0243  AST 43* 52* 61* 34  ALT '25 25 30 20  '$ ALKPHOS 91 87 89 74  BILITOT 2.2* 4.1* 4.6* 3.1*  PROT 6.7 6.6 6.6 6.4*  ALBUMIN 3.6 3.5 3.5 3.1*     CBG: Recent Labs  Lab 08/11/22 2026 08/12/22 1513 08/12/22 2139 08/13/22 0613  GLUCAP 124* 146* 129* 132*      Recent Results (from the past 240 hour(s))  Resp panel by RT-PCR (RSV, Flu A&B, Covid) Anterior Nasal Swab     Status: None   Collection Time: 08/10/22  5:00 PM   Specimen: Anterior Nasal Swab  Result Value Ref Range Status   SARS Coronavirus 2 by RT PCR NEGATIVE NEGATIVE Final    Comment: (NOTE) SARS-CoV-2 target nucleic acids are NOT DETECTED.  The SARS-CoV-2 RNA is generally detectable in upper respiratory specimens during the acute phase of infection. The lowest concentration of  SARS-CoV-2 viral copies this assay can detect is 138 copies/mL. A negative result does not preclude SARS-Cov-2 infection and should not be used as the sole basis for treatment or other patient management decisions. A negative result may occur with  improper specimen collection/handling, submission of specimen other than nasopharyngeal swab, presence of viral mutation(s) within the areas targeted by this assay, and inadequate number of viral copies(<138 copies/mL). A negative result must be combined with clinical observations, patient history, and epidemiological information. The expected result is Negative.  Fact Sheet for Patients:  EntrepreneurPulse.com.au  Fact Sheet for Healthcare Providers:  IncredibleEmployment.be  This test is no t yet approved or cleared by the [place] FDA and  has been authorized for detection and/or diagnosis of SARS-CoV-2 by FDA under an Emergency Use Authorization (EUA). This EUA will remain  in effect (meaning this test can be used) for  the duration of the COVID-19 declaration under Section 564(b)(1) of the Act, 21 U.S.C.section 360bbb-3(b)(1), unless the authorization is terminated  or revoked sooner.       Influenza A by PCR NEGATIVE NEGATIVE Final   Influenza B by PCR NEGATIVE NEGATIVE Final    Comment: (NOTE) The Xpert Xpress SARS-CoV-2/FLU/RSV plus assay is intended as an aid in the diagnosis of influenza from Nasopharyngeal swab specimens and should not be used as a sole basis for treatment. Nasal washings and aspirates are unacceptable for Xpert Xpress SARS-CoV-2/FLU/RSV testing.  Fact Sheet for Patients: EntrepreneurPulse.com.au  Fact Sheet for Healthcare Providers: IncredibleEmployment.be  This test is not yet approved or cleared by the [place] FDA and has been authorized for detection and/or diagnosis of SARS-CoV-2 by FDA under an Emergency Use  Authorization (EUA). This EUA will remain in effect (meaning this test can be used) for the duration of the COVID-19 declaration under Section 564(b)(1) of the Act, 21 U.S.C. section 360bbb-3(b)(1), unless the authorization is terminated or revoked.     Resp Syncytial Virus by PCR NEGATIVE NEGATIVE Final    Comment: (NOTE) Fact Sheet for Patients: EntrepreneurPulse.com.au  Fact Sheet for Healthcare Providers: IncredibleEmployment.be  This test is not yet approved or cleared by the [place] FDA and has been authorized for detection and/or diagnosis of SARS-CoV-2 by FDA under an Emergency Use Authorization (EUA). This EUA will remain in effect (meaning this test can be used) for the duration of the COVID-19 declaration under Section 564(b)(1) of the Act, 21 U.S.C. section 360bbb-3(b)(1), unless the authorization is terminated or revoked.  Performed at [facility], [place] [address] [place], [place] [address]   MRSA Next Gen by PCR, Nasal     Status: None   Collection Time: 08/11/22  8:26 PM   Specimen: Nasal Mucosa; Nasal Swab  Result Value Ref Range Status   MRSA by PCR Next Gen NOT DETECTED NOT DETECTED Final    Comment: (NOTE) The GeneXpert MRSA Assay (FDA approved for NASAL specimens only), is one component of a comprehensive MRSA colonization surveillance program. It is not intended to diagnose MRSA infection nor to guide or monitor treatment for MRSA infections. Test performance is not FDA approved in patients less than 6 years old. Performed at [facility], [place] [address], [place], [place] [address]   Surgical PCR screen     Status: None   Collection Time: 08/12/22  1:04 PM   Specimen: Nasal Mucosa; Nasal Swab  Result Value Ref Range Status   MRSA, PCR NEGATIVE NEGATIVE Final   Staphylococcus aureus NEGATIVE NEGATIVE Final    Comment: (NOTE) The Xpert SA Assay (FDA approved for NASAL specimens in patients  64 years of age and older), is one component of a comprehensive surveillance program. It is not intended to diagnose infection nor to guide or monitor treatment. Performed at [facility], [place] [address], [place], [place] [address]          Radiology Studies: DG Chest 2 View  Result Date: 08/13/2022 CLINICAL DATA:  Provided history: Cardiac device in-situ. Pacemaker implant. EXAM: CHEST - 2 VIEW COMPARISON:  Prior chest radiographs 08/11/2022 and earlier. Thoracic and lumbar spine CT 08/10/2022. FINDINGS: Shallow inspiration radiograph. Left chest dual lead implantable cardiac device with leads terminating in the regions of the right atrium and right ventricle. The cardiomediastinal silhouette is unchanged. Aortic atherosclerosis. Prominence of the intra lung markings compatible with interstitial edema. Possible trace bilateral pleural effusions. No evidence  of pneumothorax. Vertebral compression fracture at the thoracolumbar junction, new from the prior thoracic spine CT of 08/10/2022 and likely acute. Impression #6 will be called to the ordering clinician or representative by the Radiologist Assistant, and communication documented in the PACS or [employer]. IMPRESSION: 1. Shallow inspiration radiograph. 2. Left chest dual-lead implantable cardiac device with leads terminating in the regions of the right atrium and right ventricle. 3. Pulmonary interstitial edema. 4. Possible trace bilateral pleural effusions. 5. Aortic Atherosclerosis (ICD10-I70.0). 6. Acute vertebral compression fracture at the thoracolumbar junction, new from the prior thoracic and lumbar spine CT of 08/10/2022. The posterior elements are excluded from the field of view at this level, and dedicated thoracic and lumbar spine radiographs are recommended for further evaluation. Electronically Signed   By: Kellie Simmering D.O.   On: 08/13/2022 08:23   EP PPM/ICD IMPLANT  Result Date: 08/12/2022 SURGEON:  Allegra Lai, MD    PREPROCEDURE DIAGNOSIS:  complete AV block   POSTPROCEDURE DIAGNOSIS:  complete AV block    PROCEDURES:  1. Pacemaker implantation.   INTRODUCTION:  [name] is a 89 y.o. male with a history of bradycardia who presents today for pacemaker implantation.  The patient reports intermittent episodes of dizziness over the past few months.  No reversible causes have been identified.  The patient therefore presents today for pacemaker implantation.   DESCRIPTION OF PROCEDURE:  Informed written consent was obtained, and  the patient was brought to the electrophysiology lab in a fasting state.  The patient required no sedation for the procedure today.  The patients left chest was prepped and draped in the usual sterile fashion by the EP lab staff. The skin overlying the left deltopectoral region was infiltrated with lidocaine for local analgesia.  A 4-cm incision was made over the left deltopectoral region.  A left subcutaneous pacemaker pocket was fashioned using a combination of sharp and blunt dissection. Electrocautery was required to assure hemostasis.  RA/RV Lead Placement: The left axillary vein was therefore cannulated.  Through the left axillary vein, a Abbott Ultipace 1231-52  (serial number  [id number]) right atrial lead and an Abbott Ultipace 1231-65 (serial number  [id number]) right ventricular lead were advanced with fluoroscopic visualization into the right atrial appendage and right ventricular apex positions respectively.  Initial atrial lead P- waves measured 4.6 mV with impedance of 587 ohms and a threshold of 1 V at 0.5 msec.  Right ventricular lead R-waves paced from temp wire with an impedance of 674 ohms and a threshold of 1.2 V at 0.5 msec.  Both leads were secured to the pectoralis fascia using #2-0 silk over the suture sleeves. Device Placement:  The leads were then connected to an [place]  (serial number  [id number] ) pacemaker.  The pocket was irrigated with copious gentamicin  solution.  The pacemaker was then placed into the pocket.  The pocket was then closed in 3 layers with 2.0 Vicryl suture for the 3.0 Vicryl suture subcutaneous and subcuticular layers.  Steri-  Strips and a sterile dressing were then applied. EBL<33m.  There were no early apparent complications.   CONCLUSIONS:  1. Successful implantation of a Abbott Assurity [id number]-chamber pacemaker for symptomatic bradycardia  2. No early apparent complications.       Will CCurt Bears MD 08/12/2022 12:16 PM  ECHOCARDIOGRAM COMPLETE  Result Date: 08/12/2022    ECHOCARDIOGRAM REPORT   Patient Name:   Zachary SCHOOLFIELDDate of Exam: 08/12/2022 Medical Rec #:  [id number]  Height:       66.0 in Accession #:    [id number]   Weight:       203.3 lb Date of Birth:  11-04-1933    BSA:          2.014 m Patient Age:    48 years     BP:           162/59 mmHg Patient Gender: M            HR:           60 bpm. Exam Location:  Inpatient Procedure: 2D Echo, Cardiac Doppler, Color Doppler and Intracardiac            Opacification Agent Indications:    Syncope R55  History:        Patient has prior history of Echocardiogram examinations, most                 recent 02/02/2022. Angina and CAD, TIA, Arrythmias:Atrial                 Fibrillation, Signs/Symptoms:Chest Pain and Syncope; Risk                 Factors:Hypertension, Sleep Apnea, Diabetes and Dyslipidemia.                 CKD, stage III.  Sonographer:    Ronny Flurry Referring Phys: [id number] [place]  1. Left ventricular ejection fraction, by estimation, is 65 to 70%. The left ventricle has normal function. The left ventricle has no regional wall motion abnormalities. Left ventricular diastolic parameters are indeterminate.  2. Right ventricular systolic function is normal. The right ventricular size is normal. There is normal pulmonary artery systolic pressure.  3. The mitral valve is degenerative. No evidence of mitral valve regurgitation. No evidence of mitral stenosis.  Moderate to severe mitral annular calcification.  4. The aortic valve is tricuspid. There is moderate calcification of the aortic valve. There is moderate thickening of the aortic valve. Aortic valve regurgitation is trivial. Moderate aortic valve stenosis. Aortic valve area, by VTI measures 1.24 cm. Aortic valve mean gradient measures 21.5 mmHg. Aortic valve Vmax measures 3.15 m/s.  5. The inferior vena cava is dilated in size with >50% respiratory variability, suggesting right atrial pressure of 8 mmHg. FINDINGS  Left Ventricle: Left ventricular ejection fraction, by estimation, is 65 to 70%. The left ventricle has normal function. The left ventricle has no regional wall motion abnormalities. Definity contrast agent was given IV to delineate the left ventricular  endocardial borders. The left ventricular internal cavity size was normal in size. There is no left ventricular hypertrophy. Left ventricular diastolic parameters are indeterminate. Right Ventricle: The right ventricular size is normal. No increase in right ventricular wall thickness. Right ventricular systolic function is normal. There is normal pulmonary artery systolic pressure. The tricuspid regurgitant velocity is 1.51 m/s, and  with an assumed right atrial pressure of 8 mmHg, the estimated right ventricular systolic pressure is 32.4 mmHg. Left Atrium: Left atrial size was normal in size. Right Atrium: Right atrial size was normal in size. Pericardium: There is no evidence of pericardial effusion. Mitral Valve: The mitral valve is degenerative in appearance. Moderate to severe mitral annular calcification. No evidence of mitral valve regurgitation. No evidence of mitral valve stenosis. Tricuspid Valve: The tricuspid valve is normal in structure. Tricuspid valve regurgitation is mild . No evidence of tricuspid stenosis. Aortic Valve: The aortic valve is tricuspid. There  is moderate calcification of the aortic valve. There is moderate thickening of  the aortic valve. Aortic valve regurgitation is trivial. Moderate aortic stenosis is present. Aortic valve mean gradient measures 21.5 mmHg. Aortic valve peak gradient measures 39.7 mmHg. Aortic valve area, by VTI measures 1.24 cm. Pulmonic Valve: The pulmonic valve was normal in structure. Pulmonic valve regurgitation is not visualized. No evidence of pulmonic stenosis. Aorta: The aortic root is normal in size and structure. Venous: The inferior vena cava is dilated in size with greater than 50% respiratory variability, suggesting right atrial pressure of 8 mmHg. IAS/Shunts: No atrial level shunt detected by color flow Doppler.  LEFT VENTRICLE PLAX 2D LVOT diam:     2.00 cm LV SV:         79 LV SV Index:   39 LVOT Area:     3.14 cm  RIGHT VENTRICLE             IVC RV S prime:     15.40 cm/s  IVC diam: 2.20 cm TAPSE (M-mode): 2.0 cm LEFT ATRIUM           Index        RIGHT ATRIUM           Index LA Vol (A4C): 49.9 ml 24.78 ml/m  RA Area:     15.40 cm                                    RA Volume:   38.80 ml  19.27 ml/m  AORTIC VALVE AV Area (Vmax):    1.36 cm AV Area (Vmean):   1.30 cm AV Area (VTI):     1.24 cm AV Vmax:           315.00 cm/s AV Vmean:          213.000 cm/s AV VTI:            0.634 m AV Peak Grad:      39.7 mmHg AV Mean Grad:      21.5 mmHg LVOT Vmax:         136.67 cm/s LVOT Vmean:        88.433 cm/s LVOT VTI:          0.251 m LVOT/AV VTI ratio: 0.40  AORTA Ao Root diam: 3.30 cm Ao Asc diam:  3.00 cm MITRAL VALVE                TRICUSPID VALVE MV Area (PHT): 2.95 cm     TR Peak grad:   9.1 mmHg MV Decel Time: 257 msec     TR Vmax:        151.00 cm/s MV E velocity: 123.00 cm/s MV A velocity: 143.00 cm/s  SHUNTS MV E/A ratio:  0.86         Systemic VTI:  0.25 m                             Systemic Diam: 2.00 cm Skeet Latch MD Electronically signed by Skeet Latch MD Signature Date/Time: 08/12/2022/11:09:49 AM    Final    US Abdomen Limited RUQ (LIVER/GB)  Result Date:  08/12/2022 CLINICAL DATA:  Bilirubinemia. EXAM: ULTRASOUND ABDOMEN LIMITED RIGHT UPPER QUADRANT COMPARISON:  September 08, 2020 FINDINGS: Gallbladder: The gallbladder is surgically absent. Common bile duct: Diameter: 6.52 mm Liver: No focal lesion identified. The liver  parenchyma is coarse in echotexture and diffusely decreased in echogenicity. Portal vein is patent on color Doppler imaging with normal direction of blood flow towards the liver. Other: None. IMPRESSION: 1. Findings consistent with history of prior cholecystectomy. 2. Findings suggestive of hepatic cirrhosis without focal liver lesions. Electronically Signed   By: Virgina Norfolk M.D.   On: 08/12/2022 00:20   DG CHEST PORT 1 VIEW  Result Date: 08/11/2022 CLINICAL DATA:  Heart block EXAM: PORTABLE CHEST 1 VIEW COMPARISON:  X-ray 08/11/2022 and older.  CT 08/10/2022 FINDINGS: Overlapping cardiac leads and defibrillator pads. There is a right IJ line with tip extending to the right ventricle. Possible pacemaker lead. Enlarged cardiopericardial silhouette with calcified aorta. Vascular congestion. No pneumothorax or effusion. No consolidation. IMPRESSION: Presumed placement of a ventricular pacemaker lead via the right IJ. No pneumothorax. Electronically Signed   By: Jill Side M.D.   On: 08/11/2022 21:15   CARDIAC CATHETERIZATION  Result Date: 08/11/2022 Images from the original result were not included. Temporary transvenous pacemaker implantation 08/11/2022: Procedure performed: Ultrasound-guided access of the right internal jugular vein and placement of a 6 French sheath, placement of a temporary transvenous pacemaker into the RV apex. Pacemaker; pacemaker is at 40 cm.  Capture was obtained at <0.5 mm.  Pacemaker set at 60 bpm at 10 mA, VVI mode. Adrian Prows, MD, [facility] 08/11/2022, 8:15 PM Office: [phone] Fax: [phone] Pager: [phone]     Scheduled Meds:  Chlorhexidine Gluconate Cloth  6 each Topical Daily   insulin aspart  0-15  Units Subcutaneous TID WC   thyroid  120 mg Oral Daily   Continuous Infusions:  sodium chloride 50 mL/hr at 08/13/22 0800     LOS: 2 days    [place] [facility]  If 7PM-7AM, please contact night-coverage [email or web address]  08/13/2022, 9:16 AM

## 2022-08-13 NOTE — Progress Notes (Addendum)
   Electrophysiology Rounding Note  Patient Name: [name] Date of Encounter: 08/13/2022  Primary Cardiologist: Peter Martinique, MD Electrophysiologist: Dr. Curt Bears   Subjective   The patient is doing well today.  Much more alert s/p pacing  Inpatient Medications    Scheduled Meds:  Chlorhexidine Gluconate Cloth  6 each Topical Daily   insulin aspart  0-15 Units Subcutaneous TID WC   thyroid  120 mg Oral Daily   Continuous Infusions:  sodium chloride 50 mL/hr at 08/13/22 0700   PRN Meds: acetaminophen, acetaminophen, hydrALAZINE, ondansetron (ZOFRAN) IV, mouth rinse, oxyCODONE, polyethylene glycol, prochlorperazine   Vital Signs    Vitals:   08/13/22 0325 08/13/22 0400 08/13/22 0500 08/13/22 0700  BP:  (!) 117/58 (!) 150/75 (!) 147/70  Pulse:  92 89 97  Resp:  (!) '21 18 18  '$ Temp: 98.6 F (37 C)     TempSrc: Axillary     SpO2:  (!) 89% 94% 96%  Weight:   92.1 kg   Height:        Intake/Output Summary (Last 24 hours) at 08/13/2022 0818 Last data filed at 08/13/2022 0700 Gross per 24 hour  Intake 1009.96 ml  Output 850 ml  Net 159.96 ml   Filed Weights   08/10/22 1633 08/12/22 0358 08/13/22 0500  Weight: 91.6 kg 92.2 kg 92.1 kg    Physical Exam    GEN- The patient is well appearing, alert and oriented x 3 today.   HEENT- No gross abnormality.  Lungs- Clear to ausculation bilaterally, normal work of breathing Heart- Regular rate and rhythm, no murmurs, rubs or gallops GI- soft, NT, ND, + BS Extremities- no clubbing or cyanosis. No edema Neuro- No obvious focal abnormality.   Telemetry    V pacing 80-90s (personally reviewed)   Patient Profile     [name] is a 89 y.o. male with a history of CAD s/p stenting of RCA 2017, Prostate CA s/p radical prostatectomy 1998, HTN, HLD, h/o carotid artery disease s/p L CEA 1998, PAF (no longer on [place]), GI bleeding, chronic balance issues, and TIA who is being seen today for the evaluation of ventricular  standstill.    Assessment & Plan    Intermittent complete heart block S/p DDD Abbott PPM 08/12/2022 by Dr. Curt Bears Wound care and arm restrictions added to AVS, Hannah Strader review with pt and wife once she arrives.  CXR looks good, formal read pending.  Usual follow up made.    2. Confusion Has cleared up s/p pacing.   3. Aortic stenosis Moderate by Echo.  Follows with Dr. Martinique.   4. CAD No s/s ischemia.   EP Javel Hersh see as needed while remains here. Raunak Antuna complete teaching on family arrival due to memory issues.   For questions or updates, please contact [facility] Please consult [email or web address] for contact info under Cardiology/STEMI.  Signed, Shirley Friar, PA-C  08/13/2022, 8:18 AM   I have seen and examined this patient with [name].  Agree with above, note added to reflect my findings.  On exam, RRR, no murmurs, lu ngs clear.  She is now status post Abbott dual-chamber pacemaker for complete heart block.  Device functioning appropriately.  Chest x-ray and interrogation without issue.  OK for discharge today with follow-up in device clinic.   Zachary M. Charlesetta Milliron MD 08/13/2022 1:09 PM

## 2022-08-13 NOTE — Evaluation (Signed)
Occupational Therapy Evaluation Patient Details Name: [name] MRN: [id number] DOB: 17-Dec-1933 Today's Date: 08/13/2022   History of Present Illness [name] is an 89 y.o. male who presented 08/10/22 to the emergency department after 2 syncope episodes in 24 hours. CT head without acute intracranial findings. 2x ventricular standstill/pauses noted since admission 20-30 seconds. Temp pacer placed 1/30. S/P PPM 1/31. PMH: TIA, hypertension, hyperlipidemia, caroitd artery occlusion, CAD, prostate CA, tinnitus   Clinical Impression   PTA patient reports completing ADLs with independence, spouse reporting pt needing min assist at times for transfers and supervision with mobility using cane.  He was admitted for above and presents with problem list below.  He is oriented and follows simple commands, demonstrates decreased recall of precautions, decreased attention and problem solving.  Mild confusion with situation, towards end of session not recalling pacemaker placement and reports being admitted due to back pain.  He requires +2 min assist for transfers and mobility in room, min-max assist required for ADLs.  Pt requires max assist +2 to return to bed due to back pain. Anticipate pt will progress well once pain improves and confusion clears further.  Based on performance today, believe pt will best benefit from [facility] at dc.  Pending progress, may need to pursue post acute rehab options. Will follow acutely.      Recommendations for follow up therapy are one component of a multi-disciplinary discharge planning process, led by the attending physician.  Recommendations may be updated based on patient status, additional functional criteria and insurance authorization.   Follow Up Recommendations  Home health OT     Assistance Recommended at Discharge Frequent or constant Supervision/Assistance  Patient can return home with the following A little help with walking and/or transfers;A lot of help with  bathing/dressing/bathroom;Assistance with cooking/housework;Direct supervision/assist for medications management;Assist for transportation;Direct supervision/assist for financial management;Help with stairs or ramp for entrance    Functional Status Assessment  Patient has had a recent decline in their functional status and demonstrates the ability to make significant improvements in function in a reasonable and predictable amount of time.  Equipment Recommendations  BSC/3in1    Recommendations for Other Services       Precautions / Restrictions Precautions Precautions: Fall;ICD/Pacemaker Precaution Comments: reviewed with pt, requires cueing to adhere Restrictions Weight Bearing Restrictions: No Other Position/Activity Restrictions: pacemaker precautions L UE      Mobility Bed Mobility Overal bed mobility: Needs Assistance Bed Mobility: Sit to Supine       Sit to supine: Max assist, +2 for safety/equipment, +2 for physical assistance   General bed mobility comments: Pt needing maxAx2 to direct trunk to lay down and to lift legs onto bed, with pt demonstrating increased pain reportedly in his back.    Transfers Overall transfer level: Needs assistance Equipment used: Rolling walker (2 wheels) Transfers: Sit to/from Stand Sit to Stand: Min assist, +2 safety/equipment           General transfer comment: mod assist +2 to scoot forward but then able to power up with min assist using R UE.  +2 for safety      Balance Overall balance assessment: Needs assistance Sitting-balance support: No upper extremity supported, Feet supported Sitting balance-Leahy Scale: Fair     Standing balance support: Bilateral upper extremity supported, During functional activity Standing balance-Leahy Scale: Poor Standing balance comment: relies on RW and external support  ADL either performed or assessed with clinical judgement   ADL Overall ADL's :  Needs assistance/impaired     Grooming: Sitting;Set up;Supervision/safety           Upper Body Dressing : Sitting;Minimal assistance   Lower Body Dressing: Maximal assistance;Sit to/from stand;+2 for safety/equipment   Toilet Transfer: Minimal assistance;+2 for safety/equipment;Ambulation;Rolling walker (2 wheels)           Functional mobility during ADLs: Minimal assistance;+2 for safety/equipment;Rolling walker (2 wheels)       Vision   Vision Assessment?: No apparent visual deficits     Perception     Praxis      Pertinent Vitals/Pain Pain Assessment Pain Assessment: Faces Faces Pain Scale: Hurts whole lot Pain Location: L chest PPM site, back Pain Descriptors / Indicators: Discomfort, Grimacing, Guarding, Moaning (pt shaking at end of session) Pain Intervention(s): Limited activity within patient's tolerance, Monitored during session, Repositioned, RN gave pain meds during session     Hand Dominance Right   Extremity/Trunk Assessment Upper Extremity Assessment Upper Extremity Assessment: Defer to OT evaluation LUE Deficits / Details: PPM precautions   Lower Extremity Assessment Lower Extremity Assessment: Generalized weakness   Cervical / Trunk Assessment Cervical / Trunk Assessment: Other exceptions Cervical / Trunk Exceptions: back pain from recent fall   Communication Communication Communication: HOH   Cognition Arousal/Alertness: Awake/alert Behavior During Therapy: WFL for tasks assessed/performed Overall Cognitive Status: Impaired/Different from baseline Area of Impairment: Attention, Memory, Following commands, Safety/judgement, Awareness, Problem solving                   Current Attention Level: Sustained Memory: Decreased short-term memory, Decreased recall of precautions Following Commands: Follows one step commands consistently, Follows one step commands with increased time, Follows multi-step commands  inconsistently Safety/Judgement: Decreased awareness of deficits, Decreased awareness of safety Awareness: Emergent Problem Solving: Slow processing, Decreased initiation, Difficulty sequencing, Requires verbal cues, Requires tactile cues General Comments: Pt A&O, reporting he was here due to back pain but then reported the back pain was due to falling from his syncope episodes. Requires cueign to adhere to precautions and redirection to tasks at times.  HOH limiting as well.     General Comments  BP 162/75 sitting in chair start of session, 142/75 standing, 141/111 supine end of session; HR up to 100/110s    Exercises     Shoulder Instructions      Home Living Family/patient expects to be discharged to:: Private residence Living Arrangements: Spouse/significant other Available Help at Discharge: Family;Available 24 hours/day Type of Home: House Home Access: Stairs to enter [employer] of Steps: 2 Entrance Stairs-Rails: None Home Layout: Multi-level;Bed/bath upstairs Alternate Level Stairs-Number of Steps: 7 Alternate Level Stairs-Rails: Right Bathroom Shower/Tub: Occupational psychologist: Standard Bathroom Accessibility: Yes How Accessible: Accessible via walker Home Equipment: Rolling Walker (2 wheels);Cane - single point          Prior Functioning/Environment Prior Level of Function : History of Falls (last six months);Needs assist             Mobility Comments: at times min assist for transfers using cane, intermittent supervision for mobility ADLs Comments: not driving, indep ADLs and med mgmt (not finances)        OT Problem List: Decreased strength;Decreased activity tolerance;Impaired balance (sitting and/or standing);Decreased cognition;Decreased safety awareness;Decreased knowledge of use of DME or AE;Cardiopulmonary status limiting activity;Decreased knowledge of precautions;Pain      OT Treatment/Interventions: Self-care/ADL  training;Therapeutic exercise;DME and/or AE  instruction;Therapeutic activities;Cognitive remediation/compensation;Patient/family education;Balance training    OT Goals(Current goals can be found in the care plan section) Acute Rehab OT Goals Patient Stated Goal: less pain OT Goal Formulation: With patient Time For Goal Achievement: 08/27/22 Potential to Achieve Goals: Fair ADL Goals Pt Will Perform Grooming: with supervision;standing;sitting Pt Will Perform Lower Body Dressing: with supervision;sit to/from stand;sitting/lateral leans Pt Will Transfer to Toilet: with supervision;ambulating Pt Will Perform Toileting - Clothing Manipulation and hygiene: with supervision;sit to/from stand Additional ADL Goal #1: Pt will recall and adhere to pacemaker precautions with no more than minimal cueing.  OT Frequency: Min 2X/week    Co-evaluation PT/OT/SLP Co-Evaluation/Treatment: Yes Reason for Co-Treatment: Necessary to address cognition/behavior during functional activity;For patient/therapist safety;To address functional/ADL transfers PT goals addressed during session: Mobility/safety with mobility;Balance;Proper use of DME OT goals addressed during session: ADL's and self-care      AM-PAC OT "6 Clicks" Daily Activity     Outcome Measure Help from another person eating meals?: A Little Help from another person taking care of personal grooming?: A Little Help from another person toileting, which includes using toliet, bedpan, or urinal?: A Lot Help from another person bathing (including washing, rinsing, drying)?: A Lot Help from another person to put on and taking off regular upper body clothing?: A Little Help from another person to put on and taking off regular lower body clothing?: A Lot 6 Click Score: 15   End of Session Equipment Utilized During Treatment: Gait belt;Rolling walker (2 wheels) Nurse Communication: Mobility status  Activity Tolerance: Patient tolerated treatment  well Patient left: in bed;with call bell/phone within reach;with bed alarm set;with nursing/sitter in room;with family/visitor present  OT Visit Diagnosis: Other abnormalities of gait and mobility (R26.89);Muscle weakness (generalized) (M62.81);Pain;History of falling (Z91.81) Pain - part of body:  (back)                Time: 7494-4967 OT Time Calculation (min): 42 min Charges:  OT General Charges $OT Visit: 1 Visit OT Evaluation $OT Eval Moderate Complexity: 1 Mod  Jolaine Artist, OT Acute Rehabilitation Services Office [phone]   Delight Stare 08/13/2022, 1:56 PM

## 2022-08-14 DIAGNOSIS — I441 Atrioventricular block, second degree: Secondary | ICD-10-CM | POA: Diagnosis not present

## 2022-08-14 DIAGNOSIS — N1832 Chronic kidney disease, stage 3b: Secondary | ICD-10-CM | POA: Diagnosis not present

## 2022-08-14 DIAGNOSIS — I480 Paroxysmal atrial fibrillation: Secondary | ICD-10-CM | POA: Diagnosis not present

## 2022-08-14 LAB — CBC
HCT: 35.7 % — ABNORMAL LOW (ref 39.0–52.0)
Hemoglobin: 12.1 g/dL — ABNORMAL LOW (ref 13.0–17.0)
MCH: 35.1 pg — ABNORMAL HIGH (ref 26.0–34.0)
MCHC: 33.9 g/dL (ref 30.0–36.0)
MCV: 103.5 fL — ABNORMAL HIGH (ref 80.0–100.0)
Platelets: 95 10*3/uL — ABNORMAL LOW (ref 150–400)
RBC: 3.45 MIL/uL — ABNORMAL LOW (ref 4.22–5.81)
RDW: 15.6 % — ABNORMAL HIGH (ref 11.5–15.5)
WBC: 8.2 10*3/uL (ref 4.0–10.5)
nRBC: 0 % (ref 0.0–0.2)

## 2022-08-14 LAB — GLUCOSE, CAPILLARY
Glucose-Capillary: 141 mg/dL — ABNORMAL HIGH (ref 70–99)
Glucose-Capillary: 128 mg/dL — ABNORMAL HIGH (ref 70–99)
Glucose-Capillary: 101 mg/dL — ABNORMAL HIGH (ref 70–99)
Glucose-Capillary: 135 mg/dL — ABNORMAL HIGH (ref 70–99)

## 2022-08-14 LAB — COMPREHENSIVE METABOLIC PANEL
ALT: 15 U/L (ref 0–44)
AST: 36 U/L (ref 15–41)
Albumin: 3 g/dL — ABNORMAL LOW (ref 3.5–5.0)
Alkaline Phosphatase: 81 U/L (ref 38–126)
Anion gap: 10 (ref 5–15)
BUN: 27 mg/dL — ABNORMAL HIGH (ref 8–23)
CO2: 21 mmol/L — ABNORMAL LOW (ref 22–32)
Calcium: 8.7 mg/dL — ABNORMAL LOW (ref 8.9–10.3)
Chloride: 105 mmol/L (ref 98–111)
Creatinine, Ser: 1.47 mg/dL — ABNORMAL HIGH (ref 0.61–1.24)
GFR, Estimated: 46 mL/min — ABNORMAL LOW (ref >60)
Glucose, Bld: 119 mg/dL — ABNORMAL HIGH (ref 70–99)
Potassium: 4 mmol/L (ref 3.5–5.1)
Sodium: 136 mmol/L (ref 135–145)
Total Bilirubin: 3 mg/dL — ABNORMAL HIGH (ref 0.3–1.2)
Total Protein: 6.1 g/dL — ABNORMAL LOW (ref 6.5–8.1)

## 2022-08-14 MED ORDER — LIDOCAINE 5 % EX PTCH
1.0000 | MEDICATED_PATCH | CUTANEOUS | Status: DC
  Administered 2022-08-14 – 2022-08-19 (×6): 1 via TRANSDERMAL
  Filled 2022-08-14 (×6): qty 1

## 2022-08-14 MED ORDER — OXYCODONE HCL 5 MG PO TABS: 5.0000 mg | ORAL_TABLET | ORAL | 0 refills | Status: DC | PRN

## 2022-08-14 NOTE — Progress Notes (Signed)
PROGRESS NOTE    [name]  WYO:[id number] DOB: [birth year] DOA: 08/10/2022 PCP: [name], MD   Brief Narrative:  [name] is a 89 y.o. male with medical history significant for chronic dizziness/balance issues for at least 2 years, coronary artery disease status post PCI with stenting, paroxysmal A-fib, previously on Xarelto and no longer on DOAC after GI bleeding episodes, prostate cancer status post radical prostatectomy in 1998, hypertension, hyperlipidemia with intolerance to statin, history of carotid artery disease status post left carotid endarterectomy in 1998, OSA on CPAP, bilateral sensorineural hearing dysfunction, and TIA, who presented to [facility] ED from home via EMS due to multiple syncopal episodes at home.  Patient indicates that he had no prodrome or symptoms other than noticing that the room had moved and that approximately halfway through his fall he notes loss of consciousness and awoke on the floor with his wife over him.  Loss of consciousness may be a few minutes, no postictal or confusion once aroused.  Given second fall where he struck his head on the kitchen floor the wife called EMS for transport to the hospital. Imaging unremarkable for fracture or bleed or acute findings.  Hospitalist called for admission.  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  This was followed by placement of permanent pacemaker on 1/31. Electrophysiology is following.  Seems to be stable from a cardiac standpoint. -Mentation seems to be close to baseline now.   Elevated troponin, suspect demand ischemia in the setting of syncope Minimally elevated.  Likely due to demand ischemia.  Patient denies any chest pain. Echocardiogram showed LVEF to be 65 to 70%.  No regional wall motion abnormalities were noted.  Moderate aortic valve stenosis was seen.   Prolonged Qtc Continue to monitor on telemetry.   Avoid QT prolonging medications.  Monitor electrolytes.  Replace potassium today.  Magnesium is 1.9.   Paroxysmal A-fib, previously on Xarelto  No longer on DOAC after history of GI bleeding episodes Not on rate control agents, rate is controlled Continue telemetry monitoring   Elevated AST, bilirubin secondary to history of liver cirrhosis Ultrasound suggested liver cirrhosis.  Outpatient management of same.  Thrombocytopenia Secondary to liver cirrhosis.  No evidence for bleeding currently.  Hypothyroidism Continue thyroid medications.   CKD 3B Seems to be close to baseline.  Monitor urine output.  Avoid nephrotoxic agents.   Chronic diastolic CHF Euvolemic on exam.  Mild L1 vertebral compression fracture Incidentally noted on imaging studies.  No intervention at this time.  Conservative management with physical therapy and pain control.   DVT prophylaxis: SCDs Code Status: Full Family Communication: No family at bedside Disposition: Initially plan was for the patient to go home with home health.  However informed by physical therapist today that he has not improved as much as they would like.  Family cannot manage at home.  They are recommending inpatient rehabilitation.  Will cancel discharge for now.  Status is: Inpatient Remains inpatient appropriate because: Second-degree heart block   Consultants:  Cardiology, PCCM  Procedures:  None  Antimicrobials:  None indicated  Subjective: Sitting on the bed.  Denies any complaints.  No nausea or vomiting.  Pain is reasonably well-controlled.  Objective: Vitals:   08/14/22 0900 08/14/22 1000 08/14/22 1100 08/14/22 1135  BP:      Pulse: 87 85 83   Resp: 16 16 (!) 24   Temp:    (!) 97.1  F (36.2 C)  TempSrc:    Oral  SpO2: 94% 91% 92%   Weight:      Height:        Intake/Output Summary (Last 24 hours) at 08/14/2022 1245 Last data filed at 08/14/2022 0800 Gross per 24 hour  Intake 240 ml  Output 600 ml  Net  -360 ml    Filed Weights   08/12/22 0358 08/13/22 0500 08/14/22 0500  Weight: 92.2 kg 92.1 kg 91.8 kg    Examination:  General appearance: Awake alert.  In no distress Resp: Clear to auscultation bilaterally.  Normal effort Cardio: S1-S2 is normal regular.  No S3-S4.  No rubs murmurs or bruit GI: Abdomen is soft.  Nontender nondistended.  Bowel sounds are present normal.  No masses organomegaly Extremities: No edema.  Able to move all of his extremities  Data Reviewed: I have personally reviewed following labs and imaging studies  CBC: Recent Labs  Lab 08/10/22 1645 08/11/22 0242 08/12/22 0107 08/13/22 0243 08/14/22 0743  WBC 4.4 6.7 7.9 8.3 8.2  NEUTROABS 2.4  --   --   --   --   HGB 13.6 14.0 14.3 13.1 12.1*  HCT 39.3 40.1 40.6 37.0* 35.7*  MCV 102.3* 102.8* 101.5* 102.5* 103.5*  PLT 95* 88* 99* 98* 95*    Basic Metabolic Panel: Recent Labs  Lab 08/10/22 1645 08/11/22 0242 08/12/22 0107 08/13/22 0243 08/14/22 0743  NA 138 136 137 137 136  K 3.6 4.0 4.5 3.8 4.0  CL 105 105 101 106 105  CO2 '24 22 27 '$ 21* 21*  GLUCOSE 97 137* 133* 129* 119*  BUN '13 13 16 22 '$ 27*  CREATININE 1.46* 1.36* 1.48* 1.59* 1.47*  CALCIUM 8.8* 8.9 9.1 8.7* 8.7*  MG  --  1.8  --  1.9  --   PHOS  --  3.0  --   --   --     GFR: Estimated Creatinine Clearance: 37.6 mL/min (A) (by C-G formula based on SCr of 1.47 mg/dL (H)). Liver Function Tests: Recent Labs  Lab 08/10/22 1645 08/11/22 0242 08/12/22 0107 08/13/22 0243 08/14/22 0743  AST 43* 52* 61* 34 36  ALT '25 25 30 20 15  '$ ALKPHOS 91 87 89 74 81  BILITOT 2.2* 4.1* 4.6* 3.1* 3.0*  PROT 6.7 6.6 6.6 6.4* 6.1*  ALBUMIN 3.6 3.5 3.5 3.1* 3.0*     CBG: Recent Labs  Lab 08/13/22 1314 08/13/22 1700 08/13/22 2140 08/14/22 0700 08/14/22 1132  GLUCAP 95 154* 126* 141* 128*      Recent Results (from the past 240 hour(s))  Resp panel by RT-PCR (RSV, Flu A&B, Covid) Anterior Nasal Swab     Status: None   Collection Time:  08/10/22  5:00 PM   Specimen: Anterior Nasal Swab  Result Value Ref Range Status   SARS Coronavirus 2 by RT PCR NEGATIVE NEGATIVE Final    Comment: (NOTE) SARS-CoV-2 target nucleic acids are NOT DETECTED.  The SARS-CoV-2 RNA is generally detectable in upper respiratory specimens during the acute phase of infection. The lowest concentration of SARS-CoV-2 viral copies this assay can detect is 138 copies/mL. A negative result does not preclude SARS-Cov-2 infection and should not be used as the sole basis for treatment or other patient management decisions. A negative result may occur with  improper specimen collection/handling, submission of specimen other than nasopharyngeal swab, presence of viral mutation(s) within the areas targeted by this assay, and inadequate number of viral copies(<138 copies/mL). A negative result must  be combined with clinical observations, patient history, and epidemiological information. The expected result is Negative.  Fact Sheet for Patients:  EntrepreneurPulse.com.au  Fact Sheet for Healthcare Providers:  IncredibleEmployment.be  This test is no t yet approved or cleared by the [place] FDA and  has been authorized for detection and/or diagnosis of SARS-CoV-2 by FDA under an Emergency Use Authorization (EUA). This EUA will remain  in effect (meaning this test can be used) for the duration of the COVID-19 declaration under Section 564(b)(1) of the Act, 21 U.S.C.section 360bbb-3(b)(1), unless the authorization is terminated  or revoked sooner.       Influenza A by PCR NEGATIVE NEGATIVE Final   Influenza B by PCR NEGATIVE NEGATIVE Final    Comment: (NOTE) The Xpert Xpress SARS-CoV-2/FLU/RSV plus assay is intended as an aid in the diagnosis of influenza from Nasopharyngeal swab specimens and should not be used as a sole basis for treatment. Nasal washings and aspirates are unacceptable for Xpert Xpress  SARS-CoV-2/FLU/RSV testing.  Fact Sheet for Patients: EntrepreneurPulse.com.au  Fact Sheet for Healthcare Providers: IncredibleEmployment.be  This test is not yet approved or cleared by the [place] FDA and has been authorized for detection and/or diagnosis of SARS-CoV-2 by FDA under an Emergency Use Authorization (EUA). This EUA will remain in effect (meaning this test can be used) for the duration of the COVID-19 declaration under Section 564(b)(1) of the Act, 21 U.S.C. section 360bbb-3(b)(1), unless the authorization is terminated or revoked.     Resp Syncytial Virus by PCR NEGATIVE NEGATIVE Final    Comment: (NOTE) Fact Sheet for Patients: EntrepreneurPulse.com.au  Fact Sheet for Healthcare Providers: IncredibleEmployment.be  This test is not yet approved or cleared by the [place] FDA and has been authorized for detection and/or diagnosis of SARS-CoV-2 by FDA under an Emergency Use Authorization (EUA). This EUA will remain in effect (meaning this test can be used) for the duration of the COVID-19 declaration under Section 564(b)(1) of the Act, 21 U.S.C. section 360bbb-3(b)(1), unless the authorization is terminated or revoked.  Performed at [facility], [place] [address], [place], [place] [address]   MRSA Next Gen by PCR, Nasal     Status: None   Collection Time: 08/11/22  8:26 PM   Specimen: Nasal Mucosa; Nasal Swab  Result Value Ref Range Status   MRSA by PCR Next Gen NOT DETECTED NOT DETECTED Final    Comment: (NOTE) The GeneXpert MRSA Assay (FDA approved for NASAL specimens only), is one component of a comprehensive MRSA colonization surveillance program. It is not intended to diagnose MRSA infection nor to guide or monitor treatment for MRSA infections. Test performance is not FDA approved in patients less than 13 years old. Performed at [facility], [place] [address], [place], [place] [address]   Surgical PCR screen     Status: None   Collection Time: 08/12/22  1:04 PM   Specimen: Nasal Mucosa; Nasal Swab  Result Value Ref Range Status   MRSA, PCR NEGATIVE NEGATIVE Final   Staphylococcus aureus NEGATIVE NEGATIVE Final    Comment: (NOTE) The Xpert SA Assay (FDA approved for NASAL specimens in patients 81 years of age and older), is one component of a comprehensive surveillance program. It is not intended to diagnose infection nor to guide or monitor treatment. Performed at [facility], [place] [address] [place],  [address]          Radiology Studies: DG Chest 2 View  Result Date:  08/13/2022 CLINICAL DATA:  Provided history: Cardiac device in-situ. Pacemaker implant. EXAM: CHEST - 2 VIEW COMPARISON:  Prior chest radiographs 08/11/2022 and earlier. Thoracic and lumbar spine CT 08/10/2022. FINDINGS: Shallow inspiration radiograph. Left chest dual lead implantable cardiac device with leads terminating in the regions of the right atrium and right ventricle. The cardiomediastinal silhouette is unchanged. Aortic atherosclerosis. Prominence of the intra lung markings compatible with interstitial edema. Possible trace bilateral pleural effusions. No evidence of pneumothorax. Vertebral compression fracture at the thoracolumbar junction, new from the prior thoracic spine CT of 08/10/2022 and likely acute. Impression #6 will be called to the ordering clinician or representative by the Radiologist Assistant, and communication documented in the PACS or [employer]. IMPRESSION: 1. Shallow inspiration radiograph. 2. Left chest dual-lead implantable cardiac device with leads terminating in the regions of the right atrium and right ventricle. 3. Pulmonary interstitial edema. 4. Possible trace bilateral pleural effusions. 5. Aortic Atherosclerosis (ICD10-I70.0). 6. Acute vertebral compression fracture at the thoracolumbar junction, new from the prior thoracic  and lumbar spine CT of 08/10/2022. The posterior elements are excluded from the field of view at this level, and dedicated thoracic and lumbar spine radiographs are recommended for further evaluation. Electronically Signed   By: Kellie Simmering D.O.   On: 08/13/2022 08:23     Scheduled Meds:  Chlorhexidine Gluconate Cloth  6 each Topical Daily   insulin aspart  0-15 Units Subcutaneous TID WC   thyroid  120 mg Oral Daily   Continuous Infusions:     LOS: 3 days    [facility]  If 7PM-7AM, please contact night-coverage [email or web address]  08/14/2022, 12:45 PM

## 2022-08-14 NOTE — Progress Notes (Signed)
° °  Inpatient Rehab Admissions Coordinator : ° °Per therapy change in recommendations, patient was screened for CIR candidacy by Delaine Hernandez RN MSN.  At this time patient appears to be a potential candidate for CIR. I will place a rehab consult per protocol for full assessment. Please call me with any questions. ° °Carline Dura RN MSN °Admissions Coordinator °[phone] °  °

## 2022-08-14 NOTE — Progress Notes (Addendum)
Physical Therapy Treatment Patient Details Name: [name] MRN: [id number] DOB: 1934-02-11 Today's Date: 08/14/2022   History of Present Illness [name] is an 89 y.o. male who presented 08/10/22 to the emergency department after 2 syncope episodes in 24 hours. CT head without acute intracranial findings. 2x ventricular standstill/pauses noted since admission 20-30 seconds. Temp pacer placed 1/30. S/P PPM 1/31. Imaging 2/1 revealed: "acute vertebral compression fracture at the thoracolumbar  junction, new from the prior thoracic and lumbar spine CT of  08/10/2022". PMH: TIA, hypertension, hyperlipidemia, caroitd artery occlusion, CAD, prostate CA, tinnitus    PT Comments    Pt is continuing to experience "lightheadedness" with standing, see BP measures below. In addition, pt is still confused, not demonstrating any recall of his precautions or his location or situation despite being repeatedly reminded and re-oriented. Pt perseverating on currently shopping in [employer] and the "shoppers" around him. He is also limited by back pain despite being premedicated, see imaging results from 2/1 above. He required maxA to log roll, TA to transition sidelying to sit EOB, and minA for transfers and to ambulate in the room with a rollator. He required >10 min overall to complete bed mobility. Pt has x7 stairs to access his bedroom and bathroom at home, which he currently cannot navigate, and his family is unable to provide the level of assistance he is currently requiring. Due to these factors and pt being able to tolerate 3 hours of therapy and has good family support, updating d/c recs to AIR to ideally allow pt to become more independent to a level his family can safely manage before discharging home. Will continue to follow acutely.   BP:  141/64 supine 169/69 sitting 148/77 standing  *reported lightheadedness with standing     Recommendations for follow up therapy are one component of a  multi-disciplinary discharge planning process, led by the attending physician.  Recommendations may be updated based on patient status, additional functional criteria and insurance authorization.  Follow Up Recommendations  Acute inpatient rehab (3hours/day)     Assistance Recommended at Discharge Frequent or constant Supervision/Assistance  Patient can return home with the following A lot of help with walking and/or transfers;A lot of help with bathing/dressing/bathroom;Assistance with cooking/housework;Direct supervision/assist for financial management;Direct supervision/assist for medications management;Assist for transportation;Help with stairs or ramp for entrance   Equipment Recommendations  Rollator (4 wheels);BSC/3in1    Recommendations for Other Services Rehab consult     Precautions / Restrictions Precautions Precautions: Fall;ICD/Pacemaker;Back Precaution Booklet Issued: Yes (comment) Precaution Comments: provided pacemaker and back precautions handout, needs cues for compliance Required Braces or Orthoses:  ("conservative management for the compression fracture" per Dr. [place] Pink 08/14/22) Restrictions Weight Bearing Restrictions: No LUE Weight Bearing: Non weight bearing Other Position/Activity Restrictions: pacemaker precautions L UE     Mobility  Bed Mobility Overal bed mobility: Needs Assistance Bed Mobility: Rolling, Sidelying to Sit Rolling: Max assist Sidelying to sit: Total assist       General bed mobility comments: Bed flat and rails down to try to simulate home set-up. Pt needing max repeated cues and several minutes to log roll to R with L hand on chest. Pt easily distracted by pain and rolled himself back. Pt unable to follow commands to bring legs off EOB and push up on R UE to sit up despite max cues and >5 min to attempt, needing TA with pt actively resisting due to back pain.    Transfers Overall transfer level: Needs assistance Equipment  used:  Rollator (4 wheels) Transfers: Sit to/from Stand Sit to Stand: Min assist           General transfer comment: Pt needing repeated cues to place R hand on sitting surface and not push through L UE to transfer to stand, 1x from EOB and 1x from rollator, minA to power up to stand and steady pt. Max cues and assistance for rollator brake management though.    Ambulation/Gait Ambulation/Gait assistance: Min assist Gait Distance (Feet): 20 Feet (x2 bouts of ~20 ft > ~3 ft) Assistive device: Rollator (4 wheels) Gait Pattern/deviations: Step-through pattern, Decreased stride length, Shuffle, Trunk flexed Gait velocity: reduced Gait velocity interpretation: <1.31 ft/sec, indicative of household ambulator   General Gait Details: Pt with slow, small, shuffling steps, needing minA for stability and to direct pt with rollator. Pt needed cues to remain proximal to rollator.   Stairs             Wheelchair Mobility    Modified Rankin (Stroke Patients Only)       Balance Overall balance assessment: Needs assistance Sitting-balance support: Feet supported, Single extremity supported, Bilateral upper extremity supported Sitting balance-Leahy Scale: Poor Sitting balance - Comments: Pt preferring UE support to unweight back due to pain, needing reminders to not push through his L UE   Standing balance support: Bilateral upper extremity supported, Reliant on assistive device for balance, During functional activity Standing balance-Leahy Scale: Poor Standing balance comment: Reliant on RW and external physical assistance                            Cognition Arousal/Alertness: Awake/alert Behavior During Therapy: WFL for tasks assessed/performed Overall Cognitive Status: Impaired/Different from baseline Area of Impairment: Attention, Memory, Following commands, Safety/judgement, Awareness, Problem solving, Orientation                 Orientation Level: Disoriented to,  Place, Situation, Time Current Attention Level: Sustained Memory: Decreased short-term memory, Decreased recall of precautions Following Commands: Follows one step commands with increased time, Follows one step commands inconsistently Safety/Judgement: Decreased awareness of safety, Decreased awareness of deficits Awareness: Emergent Problem Solving: Slow processing, Decreased initiation, Difficulty sequencing, Requires verbal cues, Requires tactile cues General Comments: Pt believes he is currently at [employer] shopping, despite max cues to look around and identify if this environment looks like a store and repeatedly re-orienting pt. Pt easily distracted by pain and lines, needing redirection often. Max cues needed to sequence all tasks while adhering to precautions, which pt repeatedly does not recall.        Exercises      General Comments General comments (skin integrity, edema, etc.): BP 141/64 supine, 169/69 sitting, 148/77 standing; reported lightheadedness with standing      Pertinent Vitals/Pain Pain Assessment Pain Assessment: Faces Faces Pain Scale: Hurts whole lot Pain Location: back Pain Descriptors / Indicators: Discomfort, Grimacing, Guarding, Moaning Pain Intervention(s): Limited activity within patient's tolerance, Monitored during session, Premedicated before session, Repositioned    Home Living                          Prior Function            PT Goals (current goals can now be found in the care plan section) Acute Rehab PT Goals Patient Stated Goal: to reduce back pain PT Goal Formulation: With patient/family Time For Goal Achievement: 08/27/22 Potential to Achieve Goals: Good  Progress towards PT goals: Progressing toward goals    Frequency    Min 3X/week      PT Plan Discharge plan needs to be updated    Co-evaluation              AM-PAC PT "6 Clicks" Mobility   Outcome Measure  Help needed turning from your back to your side  while in a flat bed without using bedrails?: A Lot Help needed moving from lying on your back to sitting on the side of a flat bed without using bedrails?: Total Help needed moving to and from a bed to a chair (including a wheelchair)?: A Little Help needed standing up from a chair using your arms (e.g., wheelchair or bedside chair)?: A Little Help needed to walk in hospital room?: A Lot Help needed climbing 3-5 steps with a railing? : A Lot 6 Click Score: 13    End of Session Equipment Utilized During Treatment: Gait belt Activity Tolerance: Patient limited by pain Patient left: with call bell/phone within reach;with family/visitor present;in chair;with chair alarm set Nurse Communication: Mobility status PT Visit Diagnosis: Unsteadiness on feet (R26.81);Other abnormalities of gait and mobility (R26.89);Muscle weakness (generalized) (M62.81);History of falling (Z91.81);Difficulty in walking, not elsewhere classified (R26.2);Dizziness and giddiness (R42);Pain Pain - Right/Left: Left Pain - part of body:  (chest; back)     Time: 1761-6073 PT Time Calculation (min) (ACUTE ONLY): 68 min  Charges:  $Gait Training: 23-37 mins $Therapeutic Activity: 38-52 mins                     Moishe Spice, PT, DPT Acute Rehabilitation Services  Office: [place] 08/14/2022, 1:05 PM

## 2022-08-14 NOTE — Progress Notes (Signed)
Occupational Therapy Treatment Patient Details Name: [name] MRN: [id number] DOB: 1934-07-05 Today's Date: 08/14/2022   History of present illness [name] is an 89 y.o. male who presented 08/10/22 to the emergency department after 2 syncope episodes in 24 hours. CT head without acute intracranial findings. 2x ventricular standstill/pauses noted since admission 20-30 seconds. Temp pacer placed 1/30. S/P PPM 1/31. Imaging 2/1 revealed: "acute vertebral compression fracture at the thoracolumbar  junction, new from the prior thoracic and lumbar spine CT of  08/10/2022". PMH: TIA, hypertension, hyperlipidemia, caroitd artery occlusion, CAD, prostate CA, tinnitus   OT comments  Pt with significant decline since last treatment session due to back pain and confusion. Required Max to total A with bed mobility to sit EOB. Once EOB, pt could not/would not stand using RW. Overall Max to total A with bathing and dressing. Daughter present after session and states both children live OOT and would not be able to help after DC from [facility]. Daughter states her father was having moments of confusion and demonstrating poor problem solving by being subject to many scams, losing significant amounts of money. She expresses concerns that her Dad has "dementia". After @ 30 min delay, returned to the room to discuss DC planning with daughter and Mr Netto had no recall of our earlier session. Feel he would benefit from a longer period of rehab at [facility]. Discussed with daughter. Acute OT to follow. Family states they would prefer [facility] or [facility] if available.    Recommendations for follow up therapy are one component of a multi-disciplinary discharge planning process, led by the attending physician.  Recommendations may be updated based on patient status, additional functional criteria and insurance authorization.    Follow Up Recommendations  Skilled nursing-short term rehab (<3 hours/day)     Assistance  Recommended at Discharge Frequent or constant Supervision/Assistance  Patient can return home with the following  A lot of help with walking and/or transfers;A lot of help with bathing/dressing/bathroom;Direct supervision/assist for medications management;Direct supervision/assist for financial management;Assist for transportation;Help with stairs or ramp for entrance   Equipment Recommendations  BSC/3in1;Hospital bed    Recommendations for Other Services      Precautions / Restrictions Precautions Precautions: Fall;ICD/Pacemaker;Back Precaution Booklet Issued: Yes (comment) Required Braces or Orthoses:  ("conservative management for the compression fracture" per Dr. [place] Pink 08/14/22) Restrictions Weight Bearing Restrictions: No Other Position/Activity Restrictions: pacemaker precautions L UE       Mobility Bed Mobility Overal bed mobility: Needs Assistance Bed Mobility: Rolling, Sidelying to Sit Rolling: Max assist Sidelying to sit: Total assist       General bed mobility comments: HOB elevated    Transfers Overall transfer level: Needs assistance Equipment used: Rollator (4 wheels) Transfers: Sit to/from Stand Sit to Stand: Min assist           General transfer comment: Pt needing repeated cues to place R hand on sitting surface and not push through L UE to transfer to stand, 1x from EOB and 1x from rollator, minA to power up to stand and steady pt. Max cues and assistance for rollator brake management though.     Balance Overall balance assessment: Needs assistance Sitting-balance support: Feet supported, Single extremity supported, Bilateral upper extremity supported Sitting balance-Leahy Scale: Poor Sitting balance - Comments: Pt preferring UE support to unweight back due to pain, needing reminders to not push through his L UE  ADL either performed or assessed with clinical judgement   ADL Overall ADL's : Needs  assistance/impaired     Grooming: Set up;Supervision/safety   Upper Body Bathing: Minimal assistance   Lower Body Bathing: Maximal assistance   Upper Body Dressing : Moderate assistance   Lower Body Dressing: Total assistance       Toileting- Clothing Manipulation and Hygiene: Maximal assistance       Functional mobility during ADLs: Total assistance (for bed mobility; unable to stand)      Extremity/Trunk Assessment Upper Extremity Assessment Upper Extremity Assessment: Generalized weakness;LUE deficits/detail LUE Deficits / Details: PPM precautions   Lower Extremity Assessment Lower Extremity Assessment: Generalized weakness        Vision   Vision Assessment?: No apparent visual deficits Additional Comments: wears glasses   Perception     Praxis      Cognition Arousal/Alertness: Awake/alert Behavior During Therapy: WFL for tasks assessed/performed Overall Cognitive Status: No family/caregiver present to determine baseline cognitive functioning Area of Impairment: Attention, Memory, Following commands, Safety/judgement, Awareness, Problem solving, Orientation                 Orientation Level: Disoriented to, Situation Current Attention Level: Sustained Memory: Decreased short-term memory, Decreased recall of precautions Following Commands: Follows one step commands with increased time, Follows one step commands inconsistently Safety/Judgement: Decreased awareness of safety, Decreased awareness of deficits Awareness: Emergent Problem Solving: Slow processing, Decreased initiation, Difficulty sequencing, Requires verbal cues, Requires tactile cues General Comments: oriented to hospital and time however unable to explain why he was in the hospital or what procedure he had.        Exercises Exercises: Other exercises Other Exercises Other Exercises: B heel slides x 10    Shoulder Instructions       General Comments VSS    Pertinent Vitals/  Pain       Pain Assessment Pain Assessment: Faces Faces Pain Scale: Hurts worst Pain Location: back Pain Descriptors / Indicators: Discomfort, Grimacing, Guarding, Moaning Pain Intervention(s): Limited activity within patient's tolerance, Other (comment) (? pain patch - messaged MD)  Home Living                                          Prior Functioning/Environment              Frequency  Min 2X/week        Progress Toward Goals  OT Goals(current goals can now be found in the care plan section)  Progress towards OT goals: OT to reassess next treatment  Acute Rehab OT Goals Patient Stated Goal: Less pain OT Goal Formulation: With patient/family Time For Goal Achievement: 08/27/22 Potential to Achieve Goals: Fair ADL Goals Pt Will Perform Grooming: with supervision;standing;sitting Pt Will Perform Lower Body Dressing: with supervision;sit to/from stand;sitting/lateral leans Pt Will Transfer to Toilet: with supervision;ambulating Pt Will Perform Toileting - Clothing Manipulation and hygiene: with supervision;sit to/from stand Additional ADL Goal #1: Pt will recall and adhere to pacemaker precautions with no more than minimal cueing.  Plan Discharge plan needs to be updated    Co-evaluation                 AM-PAC OT "6 Clicks" Daily Activity     Outcome Measure   Help from another person eating meals?: A Little Help from another person taking care of personal grooming?: A Little Help from  another person toileting, which includes using toliet, bedpan, or urinal?: A Lot Help from another person bathing (including washing, rinsing, drying)?: A Lot Help from another person to put on and taking off regular upper body clothing?: A Lot Help from another person to put on and taking off regular lower body clothing?: Total 6 Click Score: 13    End of Session Equipment Utilized During Treatment: Gait belt;Rolling walker (2 wheels)  OT Visit  Diagnosis: Unsteadiness on feet (R26.81);Other abnormalities of gait and mobility (R26.89);Muscle weakness (generalized) (M62.81);History of falling (Z91.81);Other symptoms and signs involving cognitive function;Pain Pain - part of body:  (back)   Activity Tolerance Patient limited by pain   Patient Left in bed;with call bell/phone within reach;with bed alarm set;with family/visitor present   Nurse Communication Mobility status;Patient requests pain meds        Time: 9476-5465 OT Time Calculation (min): 46 min  Charges: OT General Charges $OT Visit: 1 Visit OT Treatments $Self Care/Home Management : 23-37 mins $Therapeutic Activity: 8-22 mins  Maurie Boettcher, OT/L   Acute OT Clinical Specialist [place] Pager [phone] Office [phone]   [facility] 08/14/2022, 4:45 PM

## 2022-08-14 NOTE — Progress Notes (Signed)
Removed pressure dressing with Dr Curt Bears at bedside.  Site looks stable.  Small, soft hematoma persists.  Not plans to re-apply at this time.  Ok for D/c from [place] "[employer], [place]

## 2022-08-14 NOTE — TOC Initial Note (Addendum)
Transition of Care [facility]) - Initial/Assessment Note    Patient Details  Name: [name] MRN: [id number] Date of Birth: 17-Sep-1933  [facility]) CM/SW Contact:    Erenest Rasher, RN Phone Number: [phone] 08/14/2022, 12:16 PM  Clinical Narrative:      Dtr agreeable to [facility] after he has completed IP rehab. Notified [facility] rep, Levada Dy. States he may need Hospital bed when he goes home. Explained IP rehab CSW will arrange any additional DME for home once he dc.   Spoke to wife and dtr, Melissa at bedside. IP rehab consulted. Pt wife unable to provide care at his current level, will need rehab prior to dc home. Contacted [facility] rep for [facility] but did not have start of care until Monday. Dtr and wife are agreeable to IP rehab.   Rollator and 3n1 bedside commode delivered to room.               TOC CM spoke to wife and offered choice for [facility]. Wife agreeable to agency that will accept insurance. [place] for RW with seat and bedside commode.   Expected Discharge Plan: [place] Barriers to Discharge: No Barriers Identified   Patient Goals and CMS Choice Patient states their goals for this hospitalization and ongoing recovery are:: wants pt to get better CMS Medicare.gov Compare Post Acute Care list provided to:: Patient Represenative (must comment) (wife) Choice offered to / list presented to : Spouse      Expected Discharge Plan and Services   Discharge Planning Services: CM Consult Post Acute Care Choice: [place] arrangements for the past 2 months: Single Family Home Expected Discharge Date: 08/14/22               DME Arranged: Gilford Rile rolling with seat, 3-N-1 DME Agency: [employer]       HH Arranged: PT, OT          Prior Living Arrangements/Services Living arrangements for the past 2 months: Single Family Home Lives with:: Spouse Patient language and need for interpreter reviewed:: Yes Do you feel  safe going back to the place where you live?: Yes      Need for Family Participation in Patient Care: Yes (Comment) Care giver support system in place?: Yes (comment)   Criminal Activity/Legal Involvement Pertinent to Current Situation/Hospitalization: No - Comment as needed  Activities of Daily Living Home Assistive Devices/Equipment: Dentures (specify type), Hearing aid, Cane (specify quad or straight) (upper dentures) ADL Screening (condition at time of admission) Patient's cognitive ability adequate to safely complete daily activities?: Yes Is the patient deaf or have difficulty hearing?: Yes Does the patient have difficulty seeing, even when wearing glasses/contacts?: No Does the patient have difficulty concentrating, remembering, or making decisions?: No Patient able to express need for assistance with ADLs?: Yes Does the patient have difficulty dressing or bathing?: No Independently performs ADLs?: Yes (appropriate for developmental age) Does the patient have difficulty walking or climbing stairs?: Yes Weakness of Legs: Both Weakness of Arms/Hands: None  Permission Sought/Granted Permission sought to share information with : Case Manager, Family Supports, PCP Permission granted to share information with : Yes, Verbal Permission Granted  Share Information with NAME: Tionne Dayhoff     Permission granted to share info w Relationship: wife  Permission granted to share info w Contact Information: [phone]  Emotional Assessment           Psych Involvement: No (comment)  Admission diagnosis:  Syncope and collapse [R55] Injury of head, initial encounter [S09.90XA] Syncope, unspecified syncope type [R55] Patient Active Problem List   Diagnosis Date Noted   Second degree heart block 08/11/2022   Sinus arrest 08/11/2022   Cardiac syncope 08/11/2022   Syncope and collapse 08/10/2022   Uncontrolled type 2 diabetes mellitus with hyperglycemia, without long-term current use  of insulin ([place]) 09/09/2020   Chest pain 09/08/2020   Acute diverticulitis 02/07/2019   Angina pectoris, variant ([place]) 05/19/2018   Poor compliance with CPAP treatment 04/20/2018   Excessive daytime sleepiness 03/22/2018   Poor sleep hygiene 03/22/2018   Ventral hernia without obstruction or gangrene 10/09/2017   TIA (transient ischemic attack)    Kidney stone    Hypertension    Hearing aid worn    Coronary artery disease involving native coronary artery of native heart    Asthma due to seasonal allergies    Arthritis    Erosive gastropathy 03/26/2017   Acute blood loss anemia 03/25/2017   Melena 03/24/2017   Chronic kidney disease, stage 3b ([place]) 03/24/2017   AF (paroxysmal atrial fibrillation) ([place]) 03/24/2017   Angina pectoris ([place]) 10/09/2015   Essential hypertension    Hyperlipemia    Carotid artery occlusion    Dizziness 08/09/2015   Gait instability 05/30/2015   Hypothyroidism, acquired 10/26/2013   OSA (obstructive sleep apnea) 10/18/2013   Benign localized hyperplasia of prostate with urinary obstruction and other lower urinary tract symptoms (LUTS)(600.21) 12/21/2012   Carcinoma in situ of prostate 12/21/2012   Left shoulder pain 08/08/2012   Class 1 obesity due to excess calories with serious comorbidity and body mass index (BMI) of 34.0 to 34.9 in adult 07/24/2011   Generalized anxiety disorder 07/24/2011   Diverticulosis of colon 12/25/2009   DERMATITIS, SEBORRHEIC 12/25/2009   Noise-induced hearing loss 12/18/2009   Memory loss 08/14/2009   VENOUS INSUFFICIENCY, CHRONIC 09/09/2006   GERD without esophagitis 09/09/2006   HERNIA, HIATAL, NONCONGENITAL 09/09/2006   INSOMNIA NOS 09/09/2006   PCP:  [name], MD Pharmacy:   [id number] [address]Phone: [phone]: [phone] [facility] [address]Phone:  [phone]: [phone]    Social Determinants of Health (SDOH) Social History: SDOH Screenings   Food Insecurity: No Food Insecurity (08/11/2022)  Housing: Low Risk  (08/11/2022)  Transportation Needs: No Transportation Needs (08/11/2022)  Utilities: Not At Risk (08/11/2022)  Alcohol Screen: Low Risk  (01/12/2022)  Depression (PHQ2-9): Low Risk  (01/12/2022)  Financial Resource Strain: Low Risk  (09/18/2019)  Physical Activity: Inactive (01/12/2022)  Social Connections: Moderately Isolated (01/12/2022)  Stress: No Stress Concern Present (01/12/2022)  Tobacco Use: Medium Risk (08/12/2022)   SDOH Interventions:     Readmission Risk Interventions     No data to display

## 2022-08-15 DIAGNOSIS — N1832 Chronic kidney disease, stage 3b: Secondary | ICD-10-CM | POA: Diagnosis not present

## 2022-08-15 DIAGNOSIS — I441 Atrioventricular block, second degree: Secondary | ICD-10-CM | POA: Diagnosis not present

## 2022-08-15 DIAGNOSIS — I480 Paroxysmal atrial fibrillation: Secondary | ICD-10-CM | POA: Diagnosis not present

## 2022-08-15 LAB — GLUCOSE, CAPILLARY
Glucose-Capillary: 127 mg/dL — ABNORMAL HIGH (ref 70–99)
Glucose-Capillary: 110 mg/dL — ABNORMAL HIGH (ref 70–99)
Glucose-Capillary: 114 mg/dL — ABNORMAL HIGH (ref 70–99)
Glucose-Capillary: 126 mg/dL — ABNORMAL HIGH (ref 70–99)

## 2022-08-15 LAB — COMPREHENSIVE METABOLIC PANEL
ALT: 16 U/L (ref 0–44)
AST: 34 U/L (ref 15–41)
Albumin: 2.9 g/dL — ABNORMAL LOW (ref 3.5–5.0)
Alkaline Phosphatase: 78 U/L (ref 38–126)
Anion gap: 7 (ref 5–15)
BUN: 28 mg/dL — ABNORMAL HIGH (ref 8–23)
CO2: 23 mmol/L (ref 22–32)
Calcium: 8.6 mg/dL — ABNORMAL LOW (ref 8.9–10.3)
Chloride: 104 mmol/L (ref 98–111)
Creatinine, Ser: 1.36 mg/dL — ABNORMAL HIGH (ref 0.61–1.24)
GFR, Estimated: 50 mL/min — ABNORMAL LOW (ref >60)
Glucose, Bld: 116 mg/dL — ABNORMAL HIGH (ref 70–99)
Potassium: 3.9 mmol/L (ref 3.5–5.1)
Sodium: 134 mmol/L — ABNORMAL LOW (ref 135–145)
Total Bilirubin: 2.5 mg/dL — ABNORMAL HIGH (ref 0.3–1.2)
Total Protein: 6.2 g/dL — ABNORMAL LOW (ref 6.5–8.1)

## 2022-08-15 LAB — CBC
HCT: 33.5 % — ABNORMAL LOW (ref 39.0–52.0)
Hemoglobin: 12.3 g/dL — ABNORMAL LOW (ref 13.0–17.0)
MCH: 36.9 pg — ABNORMAL HIGH (ref 26.0–34.0)
MCHC: 36.7 g/dL — ABNORMAL HIGH (ref 30.0–36.0)
MCV: 100.6 fL — ABNORMAL HIGH (ref 80.0–100.0)
Platelets: 106 10*3/uL — ABNORMAL LOW (ref 150–400)
RBC: 3.33 MIL/uL — ABNORMAL LOW (ref 4.22–5.81)
RDW: 15.2 % (ref 11.5–15.5)
WBC: 7.8 10*3/uL (ref 4.0–10.5)
nRBC: 0 % (ref 0.0–0.2)

## 2022-08-15 LAB — AMMONIA: Ammonia: 29 umol/L (ref 9–35)

## 2022-08-15 MED ORDER — POTASSIUM CHLORIDE CRYS ER 20 MEQ PO TBCR
20.0000 meq | EXTENDED_RELEASE_TABLET | Freq: Once | ORAL | Status: AC
  Administered 2022-08-15: 20 meq via ORAL
  Filled 2022-08-15: qty 1

## 2022-08-15 NOTE — Progress Notes (Signed)
Inpatient Rehab Admissions Coordinator:   I received a call from Pt.'s daughter Lenna Sciara stating that she has spoken to other family members and that they are now able to provide 24/7 assistance following d/c from [facility]. She states that her husband will provide care during weekdays, that her brother will provide care on weekends, and her sister in law will provide care PRN. She is to send me these family member's contact information and I will call them to confirm and discuss expectations. I will open a case with Pt.'s insurance and pursue for CIR admit.   Clemens Catholic, [place], [place] Admissions Coordinator  [phone] ([place]) [phone] (office)

## 2022-08-15 NOTE — Progress Notes (Signed)
Physical Therapy Treatment Patient Details Name: [name] MRN: [id number] DOB: 10/08/1933 Today's Date: 08/15/2022   History of Present Illness [name] is an 89 y.o. male who presented 08/10/22 to the emergency department after 2 syncope episodes in 24 hours. CT head without acute intracranial findings. 2x ventricular standstill/pauses noted since admission 20-30 seconds. Temp pacer placed 1/30. S/P PPM 1/31. Imaging 2/1 revealed: "acute vertebral compression fracture at the thoracolumbar  junction, new from the prior thoracic and lumbar spine CT of  08/10/2022". PMH: TIA, hypertension, hyperlipidemia, caroitd artery occlusion, CAD, prostate CA, tinnitus    PT Comments    Pt was able to stand for longer periods of time today to ambulate to the sink, perform pericare at the sink, and ambulate back to the bed all in one bout. However, pt required increased assistance from minA initially for stability to needing up to maxA to pivot his hips safely to the EOB as his legs became progressively weaker. Pt continues to require extensive physical assistance for bed mobility due to his back pain and need for cues to maintain his precautions due to pt not recalling them. He needs physical assistance and guidance to initiate pt to mobilize. Current recommendations remain appropriate. Will continue to follow acutely.     Recommendations for follow up therapy are one component of a multi-disciplinary discharge planning process, led by the attending physician.  Recommendations may be updated based on patient status, additional functional criteria and insurance authorization.  Follow Up Recommendations  Acute inpatient rehab (3hours/day)     Assistance Recommended at Discharge Frequent or constant Supervision/Assistance  Patient can return home with the following A lot of help with walking and/or transfers;A lot of help with bathing/dressing/bathroom;Assistance with cooking/housework;Direct  supervision/assist for financial management;Direct supervision/assist for medications management;Assist for transportation;Help with stairs or ramp for entrance   Equipment Recommendations  Rollator (4 wheels);BSC/3in1    Recommendations for Other Services Rehab consult     Precautions / Restrictions Precautions Precautions: Fall;ICD/Pacemaker;Back Precaution Booklet Issued: Yes (comment) Precaution Comments: reviewed pacemaker and back precautions, needs cues for compliance Required Braces or Orthoses:  ("conservative management for the compression fracture" per Dr. [place] Pink 08/14/22) Restrictions Weight Bearing Restrictions: No Other Position/Activity Restrictions: pacemaker precautions L UE     Mobility  Bed Mobility Overal bed mobility: Needs Assistance Bed Mobility: Rolling, Sidelying to Sit, Sit to Supine Rolling: Max assist, Mod assist Sidelying to sit: Max assist, HOB elevated   Sit to supine: Total assist, HOB elevated   General bed mobility comments: Pt cued to roll to R, modA the first rep but then pt rolled himself back to supine due to back pain, thereby needing maxA the next rep to complete and maintain sidelying. MaxA to bring legs off EOB and initiate trunk ascension with pt assisting in ascending trunk. TA to return to supine due to pt not following commands well at that time.    Transfers Overall transfer level: Needs assistance Equipment used: Rolling walker (2 wheels) Transfers: Sit to/from Stand Sit to Stand: Mod assist           General transfer comment: Pt needing repeated cues for hand placement, but pt still placing bil hands on RW, needing modA to initiate anterior weight shift to get pt to begin to stand.    Ambulation/Gait Ambulation/Gait assistance: Min assist, Mod assist, Max assist Gait Distance (Feet): 40 Feet Assistive device: Rolling walker (2 wheels) Gait Pattern/deviations: Step-through pattern, Decreased stride length, Shuffle, Trunk  flexed Gait  velocity: reduced Gait velocity interpretation: <1.31 ft/sec, indicative of household ambulator   General Gait Details: Pt with slow, small, shuffling steps, needing minA for stability and to direct pt with RW to the sink. After pt stood for several min at the sink, he was fatigued and displayed increased trunk flexion and instability needing modA to begin return towards EOB but maxA to pivot hips safely to bed surface by end of gait bout as pt had ceased advancing his feet despite max cues and his legs were displaying increased instability and weakness.   Stairs             Wheelchair Mobility    Modified Rankin (Stroke Patients Only)       Balance Overall balance assessment: Needs assistance Sitting-balance support: Feet supported, Single extremity supported, Bilateral upper extremity supported, No upper extremity supported Sitting balance-Leahy Scale: Fair Sitting balance - Comments: Pt preferring UE support to unweight back due to pain, needing reminders to not push through his L UE, but able to sit EOB statically with min guard and hands on his lap   Standing balance support: Bilateral upper extremity supported, Reliant on assistive device for balance, During functional activity Standing balance-Leahy Scale: Poor Standing balance comment: Reliant on RW and external physical assistance. Pt able to stand at sink for >4 min to reach off BOS with 1 UE at a time to turn on sink and wet a cloth to wipe his buttocks x2 reps, up to [place] for stability during this task.                            Cognition Arousal/Alertness: Awake/alert Behavior During Therapy: WFL for tasks assessed/performed Overall Cognitive Status: Impaired/Different from baseline Area of Impairment: Attention, Memory, Following commands, Safety/judgement, Awareness, Problem solving, Orientation                 Orientation Level: Disoriented to, Situation (unsure of what he thought  location or time was, did not ask) Current Attention Level: Sustained Memory: Decreased short-term memory, Decreased recall of precautions Following Commands: Follows one step commands with increased time, Follows one step commands inconsistently Safety/Judgement: Decreased awareness of safety, Decreased awareness of deficits Awareness: Emergent Problem Solving: Slow processing, Decreased initiation, Difficulty sequencing, Requires verbal cues, Requires tactile cues General Comments: Pt continues to be disoriented with no recall of his precautions, needing max cues for compliance throughout session. Poor awareness of safety and deficits, needing maxA and cues to place pt safely on EOB when he began to get weak and not follow cues to get to bed.        Exercises      General Comments        Pertinent Vitals/Pain Pain Assessment Pain Assessment: Faces Faces Pain Scale: Hurts even more Pain Location: back Pain Descriptors / Indicators: Discomfort, Grimacing, Guarding, Moaning Pain Intervention(s): Limited activity within patient's tolerance, Monitored during session, Repositioned    Home Living                          Prior Function            PT Goals (current goals can now be found in the care plan section) Acute Rehab PT Goals Patient Stated Goal: to reduce back pain PT Goal Formulation: With patient/family Time For Goal Achievement: 08/27/22 Potential to Achieve Goals: Good Progress towards PT goals: Progressing toward goals    Frequency  Min 3X/week      PT Plan Current plan remains appropriate    Co-evaluation              AM-PAC PT "6 Clicks" Mobility   Outcome Measure  Help needed turning from your back to your side while in a flat bed without using bedrails?: A Lot Help needed moving from lying on your back to sitting on the side of a flat bed without using bedrails?: A Lot Help needed moving to and from a bed to a chair (including a  wheelchair)?: A Lot Help needed standing up from a chair using your arms (e.g., wheelchair or bedside chair)?: A Lot Help needed to walk in hospital room?: A Lot Help needed climbing 3-5 steps with a railing? : Total 6 Click Score: 11    End of Session Equipment Utilized During Treatment: Gait belt Activity Tolerance: Patient limited by pain Patient left: with family/visitor present;in bed;with call bell/phone within reach;with bed alarm set   PT Visit Diagnosis: Unsteadiness on feet (R26.81);Other abnormalities of gait and mobility (R26.89);Muscle weakness (generalized) (M62.81);History of falling (Z91.81);Difficulty in walking, not elsewhere classified (R26.2);Dizziness and giddiness (R42);Pain Pain - Right/Left: Left Pain - part of body:  (chest; back)     Time: 8882-8003 PT Time Calculation (min) (ACUTE ONLY): 43 min  Charges:  $Gait Training: 8-22 mins $Therapeutic Activity: 8-22 mins $Neuromuscular Re-education: 8-22 mins                     Moishe Spice, PT, DPT Acute Rehabilitation Services  Office: [place] 08/15/2022, 5:57 PM

## 2022-08-15 NOTE — Progress Notes (Addendum)
CSW was alerted by CIR that pt may be more appropriate for SNF. Due to pt's flucating orientation, CSW called pt's wife. Pt wife asked CSW to speak with their daughter [name]. [name] explained that she does not think that the pt would like to go to rehab however she does not think that family can provide the 24-care after release from CIR. [name] inquired about [facility] bed availability and CSW explained that she could call facility. CSW attempted to call Whitney form [facility] however had to leave voicemail. [name] infomred CSW that she would need to consult her mother and give CSW a call back  @:28 pm- CSW received a call back form [name] and she states that the family wants to pursue CIR and can provide 24 hour care after discharged from CIR. CIR alerted.

## 2022-08-15 NOTE — Progress Notes (Signed)
Patient assisted OOB to chair this morning. Patient was able to get OOB by himself after RN helped him to the sitting position on side of bed.  Tolerated well. Patient brushed his teeth by himself and then stood by chair and walked to the wheelchair by himself with RN by side. Report called to 6 E RN and taken upstairs to 6E.

## 2022-08-15 NOTE — Progress Notes (Signed)
PT declined CPAP at this time

## 2022-08-15 NOTE — Progress Notes (Signed)
Inpatient Rehab Admissions Coordinator:    I met with Pt., daughter, and wife to discuss potential CIR admit. Pt. Confused, pulling at lines. Pt.'s daughter states that pt.'s wife would not likely be able to care for pt. By herself after CIR, so they would prefer SNF. Also feels, Pt. May benefit from lower intensity. They prefer [facility], [facility], or [facility].   Clemens Catholic, [facility], [facility] Admissions Coordinator  [phone] ([place]) [phone] (office)

## 2022-08-15 NOTE — Progress Notes (Signed)
PROGRESS NOTE    [name]  GYJ:[id number] DOB: Aug 03, 1933 DOA: 08/10/2022 PCP: [name], MD   Brief Narrative:  [name] is a 89 y.o. male with medical history significant for chronic dizziness/balance issues for at least 2 years, coronary artery disease status post PCI with stenting, paroxysmal A-fib, previously on Xarelto and no longer on DOAC after GI bleeding episodes, prostate cancer status post radical prostatectomy in 1998, hypertension, hyperlipidemia with intolerance to statin, history of carotid artery disease status post left carotid endarterectomy in 1998, OSA on CPAP, bilateral sensorineural hearing dysfunction, and TIA, who presented to [facility] ED from home via EMS due to multiple syncopal episodes at home.  Patient indicates that he had no prodrome or symptoms other than noticing that the room had moved and that approximately halfway through his fall he notes loss of consciousness and awoke on the floor with his wife over him.  Loss of consciousness may be a few minutes, no postictal or confusion once aroused.  Given second fall where he struck his head on the kitchen floor the wife called EMS for transport to the hospital. Imaging unremarkable for fracture or bleed or acute findings.  Hospitalist called for admission.  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  This was followed by placement of permanent pacemaker on 1/31. Seems to be stable from cardiac standpoint.   Elevated troponin, suspect demand ischemia in the setting of syncope Minimally elevated.  Likely due to demand ischemia.  Patient denies any chest pain. Echocardiogram showed LVEF to be 65 to 70%.  No regional wall motion abnormalities were noted.  Moderate aortic valve stenosis was seen.  Acute encephalopathy Likely secondary to second-degree heart block.  Patient likely also has cognitive impairment at baseline.   This can be further pursued in the outpatient setting.  Mentation seems to be stable and improved from time of admission.  Ammonia level noted to be normal.   Prolonged Qtc Avoid QT prolonging medications.  Monitor electrolytes periodically.   Paroxysmal A-fib, previously on Xarelto  No longer on DOAC after history of GI bleeding episodes Not on rate control agents, rate is controlled   Elevated AST, bilirubin secondary to history of liver cirrhosis Ultrasound suggested liver cirrhosis.  Ammonia level noted to be normal.  Will need further evaluation and management and perhaps referral to gastroenterology as an outpatient.  Thrombocytopenia Secondary to liver cirrhosis.  No evidence for bleeding currently.  Hypothyroidism Continue thyroid medications.   CKD 3B Seems to be close to baseline.  Monitor urine output.  Avoid nephrotoxic agents.   Chronic diastolic CHF Euvolemic on exam.  Mild L1 vertebral compression fracture Incidentally noted on imaging studies.  No intervention at this time.  Conservative management with physical therapy and pain control.  Lidoderm patch has been prescribed.   DVT prophylaxis: SCDs Code Status: Full Family Communication: No family at bedside Disposition: CIR is recommended.  Status is: Inpatient Remains inpatient appropriate because: Second-degree heart block   Consultants:  Cardiology, PCCM  Procedures:  None  Antimicrobials:  None indicated  Subjective: Sitting on the bed trying to eat his breakfast.  Denies any complaints.  Mildly distracted.  Objective: Vitals:   08/15/22 0546 08/15/22 0547 08/15/22 0741 08/15/22 0800  BP: (!) 142/71   (!) 158/65  Pulse: 86   90  Resp: 19   (!) 21  Temp:   98.4 F (36.9 C)   TempSrc:  Oral   SpO2: 91% 93%  91%  Weight:      Height:        Intake/Output Summary (Last 24 hours) at 08/15/2022 0925 Last data filed at 08/15/2022 0500 Gross per 24 hour  Intake 450 ml  Output 950 ml  Net  -500 ml    Filed Weights   08/13/22 0500 08/14/22 0500 08/15/22 0500  Weight: 92.1 kg 91.8 kg 91 kg    Examination:  General appearance: Awake alert.  In no distress Resp: Clear to auscultation bilaterally.  Normal effort Cardio: S1-S2 is normal regular.  No S3-S4.  No rubs murmurs or bruit GI: Abdomen is soft.  Nontender nondistended.  Bowel sounds are present normal.  No masses organomegaly Extremities: No edema.  Moving all of his extremities.  Physical deconditioning is noted.  Data Reviewed: I have personally reviewed following labs and imaging studies  CBC: Recent Labs  Lab 08/10/22 1645 08/11/22 0242 08/12/22 0107 08/13/22 0243 08/14/22 0743 08/15/22 0432  WBC 4.4 6.7 7.9 8.3 8.2 7.8  NEUTROABS 2.4  --   --   --   --   --   HGB 13.6 14.0 14.3 13.1 12.1* 12.3*  HCT 39.3 40.1 40.6 37.0* 35.7* 33.5*  MCV 102.3* 102.8* 101.5* 102.5* 103.5* 100.6*  PLT 95* 88* 99* 98* 95* 106*    Basic Metabolic Panel: Recent Labs  Lab 08/11/22 0242 08/12/22 0107 08/13/22 0243 08/14/22 0743 08/15/22 0432  NA 136 137 137 136 134*  K 4.0 4.5 3.8 4.0 3.9  CL 105 101 106 105 104  CO2 22 27 21* 21* 23  GLUCOSE 137* 133* 129* 119* 116*  BUN '13 16 22 '$ 27* 28*  CREATININE 1.36* 1.48* 1.59* 1.47* 1.36*  CALCIUM 8.9 9.1 8.7* 8.7* 8.6*  MG 1.8  --  1.9  --   --   PHOS 3.0  --   --   --   --     GFR: Estimated Creatinine Clearance: 40.4 mL/min (A) (by C-G formula based on SCr of 1.36 mg/dL (H)). Liver Function Tests: Recent Labs  Lab 08/11/22 0242 08/12/22 0107 08/13/22 0243 08/14/22 0743 08/15/22 0432  AST 52* 61* 34 36 34  ALT '25 30 20 15 16  '$ ALKPHOS 87 89 74 81 78  BILITOT 4.1* 4.6* 3.1* 3.0* 2.5*  PROT 6.6 6.6 6.4* 6.1* 6.2*  ALBUMIN 3.5 3.5 3.1* 3.0* 2.9*     CBG: Recent Labs  Lab 08/14/22 0700 08/14/22 1132 08/14/22 1632 08/14/22 2159 08/15/22 0644  GLUCAP 141* 128* 101* 135* 127*      Recent Results (from the past 240 hour(s))  Resp panel by RT-PCR  (RSV, Flu A&B, Covid) Anterior Nasal Swab     Status: None   Collection Time: 08/10/22  5:00 PM   Specimen: Anterior Nasal Swab  Result Value Ref Range Status   SARS Coronavirus 2 by RT PCR NEGATIVE NEGATIVE Final    Comment: (NOTE) SARS-CoV-2 target nucleic acids are NOT DETECTED.  The SARS-CoV-2 RNA is generally detectable in upper respiratory specimens during the acute phase of infection. The lowest concentration of SARS-CoV-2 viral copies this assay can detect is 138 copies/mL. A negative result does not preclude SARS-Cov-2 infection and should not be used as the sole basis for treatment or other patient management decisions. A negative result may occur with  improper specimen collection/handling, submission of specimen other than nasopharyngeal swab, presence of viral mutation(s) within the areas targeted by this assay, and inadequate number of viral  copies(<138 copies/mL). A negative result must be combined with clinical observations, patient history, and epidemiological information. The expected result is Negative.  Fact Sheet for Patients:  EntrepreneurPulse.com.au  Fact Sheet for Healthcare Providers:  IncredibleEmployment.be  This test is no t yet approved or cleared by the [place] FDA and  has been authorized for detection and/or diagnosis of SARS-CoV-2 by FDA under an Emergency Use Authorization (EUA). This EUA will remain  in effect (meaning this test can be used) for the duration of the COVID-19 declaration under Section 564(b)(1) of the Act, 21 U.S.C.section 360bbb-3(b)(1), unless the authorization is terminated  or revoked sooner.       Influenza A by PCR NEGATIVE NEGATIVE Final   Influenza B by PCR NEGATIVE NEGATIVE Final    Comment: (NOTE) The Xpert Xpress SARS-CoV-2/FLU/RSV plus assay is intended as an aid in the diagnosis of influenza from Nasopharyngeal swab specimens and should not be used as a sole basis for  treatment. Nasal washings and aspirates are unacceptable for Xpert Xpress SARS-CoV-2/FLU/RSV testing.  Fact Sheet for Patients: EntrepreneurPulse.com.au  Fact Sheet for Healthcare Providers: IncredibleEmployment.be  This test is not yet approved or cleared by the [place] FDA and has been authorized for detection and/or diagnosis of SARS-CoV-2 by FDA under an Emergency Use Authorization (EUA). This EUA will remain in effect (meaning this test can be used) for the duration of the COVID-19 declaration under Section 564(b)(1) of the Act, 21 U.S.C. section 360bbb-3(b)(1), unless the authorization is terminated or revoked.     Resp Syncytial Virus by PCR NEGATIVE NEGATIVE Final    Comment: (NOTE) Fact Sheet for Patients: EntrepreneurPulse.com.au  Fact Sheet for Healthcare Providers: IncredibleEmployment.be  This test is not yet approved or cleared by the [place] FDA and has been authorized for detection and/or diagnosis of SARS-CoV-2 by FDA under an Emergency Use Authorization (EUA). This EUA will remain in effect (meaning this test can be used) for the duration of the COVID-19 declaration under Section 564(b)(1) of the Act, 21 U.S.C. section 360bbb-3(b)(1), unless the authorization is terminated or revoked.  Performed at [facility], [place] [address], [place], [place] [address]   MRSA Next Gen by PCR, Nasal     Status: None   Collection Time: 08/11/22  8:26 PM   Specimen: Nasal Mucosa; Nasal Swab  Result Value Ref Range Status   MRSA by PCR Next Gen NOT DETECTED NOT DETECTED Final    Comment: (NOTE) The GeneXpert MRSA Assay (FDA approved for NASAL specimens only), is one component of a comprehensive MRSA colonization surveillance program. It is not intended to diagnose MRSA infection nor to guide or monitor treatment for MRSA infections. Test performance is not FDA approved in patients  less than 19 years old. Performed at [facility], [place] [address] [place], [place] [address]   Surgical PCR screen     Status: None   Collection Time: 08/12/22  1:04 PM   Specimen: Nasal Mucosa; Nasal Swab  Result Value Ref Range Status   MRSA, PCR NEGATIVE NEGATIVE Final   Staphylococcus aureus NEGATIVE NEGATIVE Final    Comment: (NOTE) The Xpert SA Assay (FDA approved for NASAL specimens in patients 73 years of age and older), is one component of a comprehensive surveillance program. It is not intended to diagnose infection nor to guide or monitor treatment. Performed at [facility], [place] [address] [place], [place] [address]          Radiology Studies: No  results found.   Scheduled Meds:  Chlorhexidine Gluconate Cloth  6 each Topical Daily   insulin aspart  0-15 Units Subcutaneous TID WC   lidocaine  1 patch Transdermal Q24H   thyroid  120 mg Oral Daily   Continuous Infusions:     LOS: 4 days    [facility]  If 7PM-7AM, please contact night-coverage [email or web address]  08/15/2022, 9:25 AM

## 2022-08-16 DIAGNOSIS — I480 Paroxysmal atrial fibrillation: Secondary | ICD-10-CM | POA: Diagnosis not present

## 2022-08-16 DIAGNOSIS — I441 Atrioventricular block, second degree: Secondary | ICD-10-CM | POA: Diagnosis not present

## 2022-08-16 DIAGNOSIS — N1832 Chronic kidney disease, stage 3b: Secondary | ICD-10-CM | POA: Diagnosis not present

## 2022-08-16 LAB — GLUCOSE, CAPILLARY
Glucose-Capillary: 113 mg/dL — ABNORMAL HIGH (ref 70–99)
Glucose-Capillary: 92 mg/dL (ref 70–99)
Glucose-Capillary: 134 mg/dL — ABNORMAL HIGH (ref 70–99)
Glucose-Capillary: 93 mg/dL (ref 70–99)

## 2022-08-16 MED ORDER — TRAZODONE HCL 50 MG PO TABS: 50.0000 mg | ORAL_TABLET | Freq: Every evening | ORAL | Status: DC | PRN

## 2022-08-16 MED ORDER — MUSCLE RUB 10-15 % EX CREA
TOPICAL_CREAM | CUTANEOUS | Status: DC | PRN
  Filled 2022-08-16: qty 85

## 2022-08-16 MED ORDER — MELATONIN 3 MG PO TABS
3.0000 mg | ORAL_TABLET | Freq: Every day | ORAL | Status: DC
  Administered 2022-08-16 – 2022-08-19 (×4): 3 mg via ORAL
  Filled 2022-08-16 (×4): qty 1

## 2022-08-16 MED ORDER — POLYETHYLENE GLYCOL 3350 17 G PO PACK
17.0000 g | PACK | Freq: Two times a day (BID) | ORAL | Status: DC
  Administered 2022-08-16 – 2022-08-20 (×9): 17 g via ORAL
  Filled 2022-08-16 (×9): qty 1

## 2022-08-16 MED ORDER — SENNOSIDES-DOCUSATE SODIUM 8.6-50 MG PO TABS
2.0000 | ORAL_TABLET | Freq: Two times a day (BID) | ORAL | Status: DC
  Administered 2022-08-16 – 2022-08-20 (×8): 2 via ORAL
  Filled 2022-08-16 (×9): qty 2

## 2022-08-16 NOTE — Progress Notes (Signed)
Pt declined CPAP for the night.

## 2022-08-16 NOTE — Progress Notes (Signed)
PROGRESS NOTE    [name]  KKX:[id number] DOB: May 07, 1934 DOA: 08/10/2022 PCP: [name], MD   Brief Narrative:  [name] is a 89 y.o. male with medical history significant for chronic dizziness/balance issues for at least 2 years, coronary artery disease status post PCI with stenting, paroxysmal A-fib, previously on Xarelto and no longer on DOAC after GI bleeding episodes, prostate cancer status post radical prostatectomy in 1998, hypertension, hyperlipidemia with intolerance to statin, history of carotid artery disease status post left carotid endarterectomy in 1998, OSA on CPAP, bilateral sensorineural hearing dysfunction, and TIA, who presented to [facility] ED from home via EMS due to multiple syncopal episodes at home.  Patient indicates that he had no prodrome or symptoms other than noticing that the room had moved and that approximately halfway through his fall he notes loss of consciousness and awoke on the floor with his wife over him.  Loss of consciousness may be a few minutes, no postictal or confusion once aroused.  Given second fall where he struck his head on the kitchen floor the wife called EMS for transport to the hospital. Imaging unremarkable for fracture or bleed or acute findings.  Hospitalist called for admission.  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  This was followed by placement of permanent pacemaker on 1/31. Seems to be stable from cardiac standpoint.   Elevated troponin, suspect demand ischemia in the setting of syncope Minimally elevated.  Likely due to demand ischemia.  Patient denies any chest pain. Echocardiogram showed LVEF to be 65 to 70%.  No regional wall motion abnormalities were noted.  Moderate aortic valve stenosis was seen.  Acute encephalopathy Likely secondary to second-degree heart block.  Patient likely also has cognitive impairment at baseline.   Patient has a history of liver cirrhosis.  Ammonia level however was normal.   Will check TSH vitamin Zachary and folic acid levels.  Further workup can be pursued in the outpatient setting.  Mentation seems to be stable.     Prolonged Qtc Avoid QT prolonging medications.  Monitor electrolytes periodically.   Paroxysmal A-fib, previously on Xarelto  No longer on anticoagulation after history of GI bleeding episodes Not on rate control agents, rate is controlled   Elevated AST, bilirubin secondary to history of liver cirrhosis Ultrasound suggested liver cirrhosis.  Ammonia level noted to be normal.  Will need further evaluation and management and perhaps referral to gastroenterology as an outpatient.  Thrombocytopenia Secondary to liver cirrhosis.  No evidence for bleeding currently.  Hypothyroidism Continue thyroid medications.   CKD 3B Seems to be close to baseline.  Monitor urine output.  Avoid nephrotoxic agents.   Chronic diastolic CHF Euvolemic on exam.  Mild L1 vertebral compression fracture Incidentally noted on imaging studies.  No intervention at this time.  Conservative management with physical therapy and pain control.  Lidoderm patch has been prescribed.   DVT prophylaxis: SCDs Code Status: Full Family Communication: No family at bedside Disposition: CIR is recommended.  Status is: Inpatient Remains inpatient appropriate because: Second-degree heart block   Consultants:  Cardiology, PCCM  Procedures:  None  Antimicrobials:  None indicated  Subjective: Somnolent this morning but easily arousable.  Somewhat distracted.  No distress.  Objective: Vitals:   08/15/22 1240 08/15/22 1655 08/15/22 2005 08/16/22 0333  BP: (!) 166/80 (!) 144/72 (!) 151/69 (!) 150/74  Pulse: 88 85 92 82  Resp: '18 19 16 '$ 19  Temp: 99.1 F (37.3 C) 97.9 F (36.6 C) 98 F (36.7 C) 98 F (36.7 C)  TempSrc: Oral Oral Oral Oral  SpO2: 93% 92% 95%   Weight:    91.2 kg  Height:         Intake/Output Summary (Last 24 hours) at 08/16/2022 0839 Last data filed at 08/16/2022 0300 Gross per 24 hour  Intake --  Output 200 ml  Net -200 ml    Filed Weights   08/14/22 0500 08/15/22 0500 08/16/22 0333  Weight: 91.8 kg 91 kg 91.2 kg    Examination:  General appearance: Somnolent but easily arousable.  In no distress. Resp: Clear to auscultation bilaterally.  Normal effort Cardio: S1-S2 is normal regular.  No S3-S4.  No rubs murmurs or bruit GI: Abdomen is soft.  Nontender nondistended.  Bowel sounds are present normal.  No masses organomegaly Extremities: No edema.  Moving all of his extremities Neurologic: No obvious focal neurological deficits.   Data Reviewed: I have personally reviewed following labs and imaging studies  CBC: Recent Labs  Lab 08/10/22 1645 08/11/22 0242 08/12/22 0107 08/13/22 0243 08/14/22 0743 08/15/22 0432  WBC 4.4 6.7 7.9 8.3 8.2 7.8  NEUTROABS 2.4  --   --   --   --   --   HGB 13.6 14.0 14.3 13.1 12.1* 12.3*  HCT 39.3 40.1 40.6 37.0* 35.7* 33.5*  MCV 102.3* 102.8* 101.5* 102.5* 103.5* 100.6*  PLT 95* 88* 99* 98* 95* 106*    Basic Metabolic Panel: Recent Labs  Lab 08/11/22 0242 08/12/22 0107 08/13/22 0243 08/14/22 0743 08/15/22 0432  NA 136 137 137 136 134*  K 4.0 4.5 3.8 4.0 3.9  CL 105 101 106 105 104  CO2 22 27 21* 21* 23  GLUCOSE 137* 133* 129* 119* 116*  BUN '13 16 22 '$ 27* 28*  CREATININE 1.36* 1.48* 1.59* 1.47* 1.36*  CALCIUM 8.9 9.1 8.7* 8.7* 8.6*  MG 1.8  --  1.9  --   --   PHOS 3.0  --   --   --   --     GFR: Estimated Creatinine Clearance: 40.5 mL/min (A) (by C-G formula based on SCr of 1.36 mg/dL (H)). Liver Function Tests: Recent Labs  Lab 08/11/22 0242 08/12/22 0107 08/13/22 0243 08/14/22 0743 08/15/22 0432  AST 52* 61* 34 36 34  ALT '25 30 20 15 16  '$ ALKPHOS 87 89 74 81 78  BILITOT 4.1* 4.6* 3.1* 3.0* 2.5*  PROT 6.6 6.6 6.4* 6.1* 6.2*  ALBUMIN 3.5 3.5 3.1* 3.0* 2.9*     CBG: Recent Labs  Lab  08/15/22 0644 08/15/22 1237 08/15/22 1821 08/15/22 2113 08/16/22 0750  GLUCAP 127* 110* 114* 126* 113*      Recent Results (from the past 240 hour(s))  Resp panel by RT-PCR (RSV, Flu A&B, Covid) Anterior Nasal Swab     Status: None   Collection Time: 08/10/22  5:00 PM   Specimen: Anterior Nasal Swab  Result Value Ref Range Status   SARS Coronavirus 2 by RT PCR NEGATIVE NEGATIVE Final    Comment: (NOTE) SARS-CoV-2 target nucleic acids are NOT DETECTED.  The SARS-CoV-2 RNA is generally detectable in upper respiratory specimens during the acute phase of infection. The lowest concentration of SARS-CoV-2 viral copies this assay can detect is 138 copies/mL. A negative result does not preclude SARS-Cov-2 infection and should not be used as the sole basis for treatment or other patient management decisions. A negative result may occur with  improper  specimen collection/handling, submission of specimen other than nasopharyngeal swab, presence of viral mutation(s) within the areas targeted by this assay, and inadequate number of viral copies(<138 copies/mL). A negative result must be combined with clinical observations, patient history, and epidemiological information. The expected result is Negative.  Fact Sheet for Patients:  EntrepreneurPulse.com.au  Fact Sheet for Healthcare Providers:  IncredibleEmployment.be  This test is no t yet approved or cleared by the [place] FDA and  has been authorized for detection and/or diagnosis of SARS-CoV-2 by FDA under an Emergency Use Authorization (EUA). This EUA will remain  in effect (meaning this test can be used) for the duration of the COVID-19 declaration under Section 564(b)(1) of the Act, 21 U.S.C.section 360bbb-3(b)(1), unless the authorization is terminated  or revoked sooner.       Influenza A by PCR NEGATIVE NEGATIVE Final   Influenza B by PCR NEGATIVE NEGATIVE Final    Comment:  (NOTE) The Xpert Xpress SARS-CoV-2/FLU/RSV plus assay is intended as an aid in the diagnosis of influenza from Nasopharyngeal swab specimens and should not be used as a sole basis for treatment. Nasal washings and aspirates are unacceptable for Xpert Xpress SARS-CoV-2/FLU/RSV testing.  Fact Sheet for Patients: EntrepreneurPulse.com.au  Fact Sheet for Healthcare Providers: IncredibleEmployment.be  This test is not yet approved or cleared by the [place] FDA and has been authorized for detection and/or diagnosis of SARS-CoV-2 by FDA under an Emergency Use Authorization (EUA). This EUA will remain in effect (meaning this test can be used) for the duration of the COVID-19 declaration under Section 564(b)(1) of the Act, 21 U.S.C. section 360bbb-3(b)(1), unless the authorization is terminated or revoked.     Resp Syncytial Virus by PCR NEGATIVE NEGATIVE Final    Comment: (NOTE) Fact Sheet for Patients: EntrepreneurPulse.com.au  Fact Sheet for Healthcare Providers: IncredibleEmployment.be  This test is not yet approved or cleared by the [place] FDA and has been authorized for detection and/or diagnosis of SARS-CoV-2 by FDA under an Emergency Use Authorization (EUA). This EUA will remain in effect (meaning this test can be used) for the duration of the COVID-19 declaration under Section 564(b)(1) of the Act, 21 U.S.C. section 360bbb-3(b)(1), unless the authorization is terminated or revoked.  Performed at [facility], [place] [address] [place], [place] [address]   MRSA Next Gen by PCR, Nasal     Status: None   Collection Time: 08/11/22  8:26 PM   Specimen: Nasal Mucosa; Nasal Swab  Result Value Ref Range Status   MRSA by PCR Next Gen NOT DETECTED NOT DETECTED Final    Comment: (NOTE) The GeneXpert MRSA Assay (FDA approved for NASAL specimens only), is one component of a comprehensive MRSA  colonization surveillance program. It is not intended to diagnose MRSA infection nor to guide or monitor treatment for MRSA infections. Test performance is not FDA approved in patients less than 48 years old. Performed at [facility], [place] [address], [place], [place] [address]   Surgical PCR screen     Status: None   Collection Time: 08/12/22  1:04 PM   Specimen: Nasal Mucosa; Nasal Swab  Result Value Ref Range Status   MRSA, PCR NEGATIVE NEGATIVE Final   Staphylococcus aureus NEGATIVE NEGATIVE Final    Comment: (NOTE) The Xpert SA Assay (FDA approved for NASAL specimens in patients 30 years of age and older), is one component of a comprehensive surveillance program. It is not intended to diagnose infection nor to guide or monitor treatment.  Performed at [facility], [place] [address] [place], [place] [address]          Radiology Studies: No results found.   Scheduled Meds:  insulin aspart  0-15 Units Subcutaneous TID WC   lidocaine  1 patch Transdermal Q24H   polyethylene glycol  17 g Oral BID   senna-docusate  2 tablet Oral BID   thyroid  120 mg Oral Daily   Continuous Infusions:     LOS: 5 days    [facility]  If 7PM-7AM, please contact night-coverage [email or web address]  08/16/2022, 8:39 AM

## 2022-08-17 DIAGNOSIS — I480 Paroxysmal atrial fibrillation: Secondary | ICD-10-CM | POA: Diagnosis not present

## 2022-08-17 DIAGNOSIS — N1832 Chronic kidney disease, stage 3b: Secondary | ICD-10-CM | POA: Diagnosis not present

## 2022-08-17 DIAGNOSIS — I441 Atrioventricular block, second degree: Secondary | ICD-10-CM | POA: Diagnosis not present

## 2022-08-17 LAB — BASIC METABOLIC PANEL
Anion gap: 7 (ref 5–15)
BUN: 22 mg/dL (ref 8–23)
CO2: 25 mmol/L (ref 22–32)
Calcium: 8.5 mg/dL — ABNORMAL LOW (ref 8.9–10.3)
Chloride: 101 mmol/L (ref 98–111)
Creatinine, Ser: 1.38 mg/dL — ABNORMAL HIGH (ref 0.61–1.24)
GFR, Estimated: 49 mL/min — ABNORMAL LOW (ref >60)
Glucose, Bld: 112 mg/dL — ABNORMAL HIGH (ref 70–99)
Potassium: 4 mmol/L (ref 3.5–5.1)
Sodium: 133 mmol/L — ABNORMAL LOW (ref 135–145)

## 2022-08-17 LAB — IRON AND TIBC
Iron: 87 ug/dL (ref 45–182)
Saturation Ratios: 28 % (ref 17.9–39.5)
TIBC: 312 ug/dL (ref 250–450)
UIBC: 225 ug/dL

## 2022-08-17 LAB — RETICULOCYTES
Immature Retic Fract: 31.7 % — ABNORMAL HIGH (ref 2.3–15.9)
RBC.: 3.29 MIL/uL — ABNORMAL LOW (ref 4.22–5.81)
Retic Count, Absolute: 114.2 10*3/uL (ref 19.0–186.0)
Retic Ct Pct: 3.5 % — ABNORMAL HIGH (ref 0.4–3.1)

## 2022-08-17 LAB — HIV ANTIBODY (ROUTINE TESTING W REFLEX): HIV Screen 4th Generation wRfx: NONREACTIVE

## 2022-08-17 LAB — FOLATE: Folate: 8.1 ng/mL (ref >5.9)

## 2022-08-17 LAB — GLUCOSE, CAPILLARY
Glucose-Capillary: 204 mg/dL — ABNORMAL HIGH (ref 70–99)
Glucose-Capillary: 121 mg/dL — ABNORMAL HIGH (ref 70–99)
Glucose-Capillary: 116 mg/dL — ABNORMAL HIGH (ref 70–99)
Glucose-Capillary: 114 mg/dL — ABNORMAL HIGH (ref 70–99)

## 2022-08-17 LAB — VITAMIN B12: Vitamin B-12: 3201 pg/mL — ABNORMAL HIGH (ref 180–914)

## 2022-08-17 LAB — T4, FREE: Free T4: 0.82 ng/dL (ref 0.61–1.12)

## 2022-08-17 LAB — TSH: TSH: 12.47 u[IU]/mL — ABNORMAL HIGH (ref 0.350–4.500)

## 2022-08-17 LAB — RPR: RPR Ser Ql: NONREACTIVE

## 2022-08-17 LAB — FERRITIN: Ferritin: 146 ng/mL (ref 24–336)

## 2022-08-17 MED ORDER — DIPHENHYDRAMINE HCL 25 MG PO CAPS
25.0000 mg | ORAL_CAPSULE | Freq: Once | ORAL | Status: AC
  Administered 2022-08-17: 25 mg via ORAL
  Filled 2022-08-17: qty 1

## 2022-08-17 MED ORDER — CAMPHOR-MENTHOL 0.5-0.5 % EX LOTN
TOPICAL_LOTION | CUTANEOUS | Status: DC | PRN
  Filled 2022-08-17: qty 222

## 2022-08-17 NOTE — Progress Notes (Signed)
Physical Therapy Treatment Patient Details Name: [name] MRN: [id number] DOB: [birth year] Today's Date: 08/17/2022   History of Present Illness [name] is an 89 y.o. male who presented 08/10/22 to the emergency department after 2 syncope episodes in 24 hours. CT head without acute intracranial findings. 2x ventricular standstill/pauses noted since admission 20-30 seconds. Temp pacer placed 1/30. S/P PPM 1/31. Imaging 2/1 revealed: "acute vertebral compression fracture at the thoracolumbar  junction, new from the prior thoracic and lumbar spine CT of  08/10/2022". PMH: TIA, hypertension, hyperlipidemia, caroitd artery occlusion, CAD, prostate CA, tinnitus    PT Comments    Patient progressing slowly.  Very painful with movement despite slow movements and cues for pt to help himself.  Fatigued after ambulation and falling asleep in chair with poor safety so assisted back to bed.  Patient with red rash on his back and RN made aware.  Family present and supportive.  They are hopeful for acute inpatient rehab prior to d/c home.  Continue to recommend AIR.  PT will continue to follow.    Recommendations for follow up therapy are one component of a multi-disciplinary discharge planning process, led by the attending physician.  Recommendations may be updated based on patient status, additional functional criteria and insurance authorization.  Follow Up Recommendations  Acute inpatient rehab (3hours/day)     Assistance Recommended at Discharge Frequent or constant Supervision/Assistance  Patient can return home with the following A lot of help with walking and/or transfers;A lot of help with bathing/dressing/bathroom;Assistance with cooking/housework;Direct supervision/assist for financial management;Direct supervision/assist for medications management;Assist for transportation;Help with stairs or ramp for entrance   Equipment Recommendations  Rollator (4 wheels);BSC/3in1    Recommendations for  Other Services       Precautions / Restrictions Precautions Precautions: Fall;Back;ICD/Pacemaker Precaution Comments: continued education on precautions     Mobility  Bed Mobility Overal bed mobility: Needs Assistance Bed Mobility: Rolling, Sidelying to Sit, Sit to Sidelying Rolling: Max assist, +2 for safety/equipment Sidelying to sit: Max assist     Sit to sidelying: Max assist, +2 for safety/equipment General bed mobility comments: increased time for each position due to pain in his back and pt trying to go slow; assist for lifting trunk from sidelying and for feet into bed to supine with increased time to turn to back, but eventually positioned semisidelying with pillow at his back    Transfers Overall transfer level: Needs assistance Equipment used: Rolling walker (2 wheels) Transfers: Sit to/from Stand, Bed to chair/wheelchair/BSC Sit to Stand: Mod assist, +2 physical assistance   Step pivot transfers: Mod assist       General transfer comment: assist up from EOB to RW then up from recliner to RW, step pivot to bed from recliner with mod A    Ambulation/Gait Ambulation/Gait assistance: +2 safety/equipment, Min assist Gait Distance (Feet): 20 Feet Assistive device: Rolling walker (2 wheels) Gait Pattern/deviations: Step-to pattern, Trunk flexed, Decreased stride length       General Gait Details: flexed posture throughout, assist for walker safety and chair follow for safety   Stairs             Wheelchair Mobility    Modified Rankin (Stroke Patients Only)       Balance Overall balance assessment: Needs assistance Sitting-balance support: Feet supported Sitting balance-Leahy Scale: Fair     Standing balance support: Bilateral upper extremity supported, Reliant on assistive device for balance Standing balance-Leahy Scale: Poor  Cognition Arousal/Alertness: Awake/alert Behavior During Therapy:  Anxious Overall Cognitive Status: Impaired/Different from baseline Area of Impairment: Attention, Memory, Following commands, Safety/judgement, Awareness, Problem solving, Orientation                 Orientation Level: Disoriented to, Situation, Time, Place Current Attention Level: Sustained Memory: Decreased short-term memory, Decreased recall of precautions Following Commands: Follows one step commands with increased time, Follows one step commands consistently Safety/Judgement: Decreased awareness of safety   Problem Solving: Slow processing, Difficulty sequencing, Requires verbal cues General Comments: continued cues/assist for L arm due to recent PPM, repeating his concern for how he is not doing as well as we want him to despite continued encouragement and reassurance        Exercises      General Comments General comments (skin integrity, edema, etc.): VSS, noted prickly red rash on his back and pt c/o itching, RN made aware      Pertinent Vitals/Pain Pain Assessment Pain Assessment: Faces Faces Pain Scale: Hurts whole lot Pain Location: back Pain Descriptors / Indicators: Discomfort, Grimacing, Guarding, Moaning Pain Intervention(s): Monitored during session, Limited activity within patient's tolerance, Repositioned    Home Living                          Prior Function            PT Goals (current goals can now be found in the care plan section) Progress towards PT goals: Progressing toward goals    Frequency    Min 3X/week      PT Plan Current plan remains appropriate    Co-evaluation              AM-PAC PT "6 Clicks" Mobility   Outcome Measure  Help needed turning from your back to your side while in a flat bed without using bedrails?: A Lot Help needed moving from lying on your back to sitting on the side of a flat bed without using bedrails?: Total Help needed moving to and from a bed to a chair (including a wheelchair)?: A  Lot Help needed standing up from a chair using your arms (e.g., wheelchair or bedside chair)?: A Lot Help needed to walk in hospital room?: A Lot Help needed climbing 3-5 steps with a railing? : Total 6 Click Score: 10    End of Session Equipment Utilized During Treatment: Gait belt Activity Tolerance: Patient limited by pain Patient left: in bed;with bed alarm set;with call bell/phone within reach   PT Visit Diagnosis: Other abnormalities of gait and mobility (R26.89);History of falling (Z91.81);Difficulty in walking, not elsewhere classified (R26.2);Pain Pain - part of body:  (back)     Time: 6301-6010 PT Time Calculation (min) (ACUTE ONLY): 42 min  Charges:  $Gait Training: 8-22 mins $Therapeutic Activity: 23-37 mins                     Magda Kiel, PT Acute Rehabilitation Services [phone] 08/17/2022    Reginia Naas 08/17/2022, 2:33 PM

## 2022-08-17 NOTE — Progress Notes (Signed)
Pt declined CPAP for the night.

## 2022-08-17 NOTE — PMR Pre-admission (Shared)
PMR Admission Coordinator Pre-Admission Assessment  Patient: [name] is an 89 y.o., male MRN: [id number] DOB: 09-01-1933 Height: '5\' 6"'$  (167.6 cm) Weight: 93 kg  Insurance Information HMO: yes    PPO:      PCP:      IPA:      80/20:      OTHER:  PRIMARY: U[employer] Medicare       Policy#: [id number]      Subscriber: Pt  CM Name:       Phone#: [phone]     Fax#: [phone] Pre-Cert#: ***      Employer:  Benefits:  Phone #:      Name:  Irene Shipper Date: 07/13/2022 - still active Deductible: $0 (does not have deductible) OOP Max: $925 ($0 met)  CIR: $75 copay/admission SNF: 100% coverage; limited to 100 days Outpatient: $10 copay/visit Home Health:  100% coverage DME: 80% coverage; 20% co-insurance Providers: in network  SECONDARY:       Policy#:      Phone#:   Development worker, community:       Phone#:   The Engineer, petroleum" for patients in Inpatient Rehabilitation Facilities with attached "Privacy Act [place] Records" was provided and verbally reviewed with: Patient  Emergency Contact Information Contact Information     Name Relation Home Work Mobile   Saadeh,Deloris Spouse [phone]  [phone]       Current Medical History  Patient Admitting Diagnosis: Heart Block, Syncope s/p PPM  History of Present Illness: [name] is a 89 y.o. male with medical history significant for chronic dizziness, feeling of being off balance x 2 years, coronary artery disease status post PCI with stenting, paroxysmal A-fib, previously on Xarelto and no longer on DOAC after GI bleed, prostate cancer status post radical prostatectomy in 1998, hypertension, hyperlipidemia with intolerance to statin, history of carotid artery disease status post left carotid endarterectomy in 1998, OSA on CPAP, and TIA, who presented to [facility] ED 08/10/22 from home via EMS due to 2 syncopal episodes within 24-hours. In the ED, trauma scan was unremarkable.  His lab studies were notable for  elevated troponin of 27.  While in the ED he had intermittent muscles spasms in his mid back. Endorses he was started on Farxiga a few months.  No other new medications.  Denies dysuria.  Admits to increased urinary frequency.  TRH, hospitalist service, was asked to admit. Admission vitals and chemistries were as follows: max 97.6. BP 168/87, pulse 92, respiratory rate 13, O2 saturation 98% on room air. Influenza A by PCR negative, influenza B by PCR negative, RSV by PCR negative, COVID-19 by PCR negative. Platelet count 95. Syncopal episodes felt to be  due to second degree heart block. Patient underwent temporary pacemaker placement initially.  This was followed by placement of permanent pacemaker on 1/31.Echocardiogram showed LVEF to be 65 to 70%. No regional wall motion abnormalities were noted. Moderate aortic valve stenosis was seen. Pt. Also with encephalopathy 2/2 heart block and prolonged QTC. Pt. Also with Mild L1 vertebral compression fracture Incidentally noted on imaging studies.  Pt. Seen by PT/OT/SLP and they recommend CIR to assist return to PLOF.    Patient's medical record from [facility]  has been reviewed by the rehabilitation admission coordinator and physician.  Past Medical History  Past Medical History:  Diagnosis Date   Anginal pain ([place])    Anxiety    " OCCASIONAL"   Arthritis    Asthma due to seasonal  allergies    uses inhalers prn   Carotid artery occlusion    Left   Complication of anesthesia    " had tremors after prostate surgery   Coronary artery disease    Diverticulitis    recurrent   Family history of anesthesia complication    MOTHER    GERD (gastroesophageal reflux disease)    H/O hiatal hernia    Hearing aid worn    Hyperlipemia    Hypertension    Kidney stone    lithotripsy 8466 w complications, req stents, Dr Rosana Hoes   Prostate CA [facility])    Shortness of breath    Thyroid disease    TIA (transient ischemic attack)    Tinnitus      Has the patient had major surgery during 100 days prior to admission? Yes  Family History   family history includes CAD in his child; Dementia in his sister; Hashimoto's thyroiditis in his child; Heart attack in his sister; Heart disease in his father, mother, and sister; Kidney failure in his father; Sjogren's syndrome in his child.  Current Medications  Current Facility-Administered Medications:    acetaminophen (TYLENOL) tablet 325-650 mg, 325-650 mg, Oral, Q4H PRN, Camnitz, Will Hassell Done, MD, 650 mg at 08/13/22 0535   acetaminophen (TYLENOL) tablet 650 mg, 650 mg, Oral, Q6H PRN, Kayleen Memos, DO, 650 mg at 08/15/22 1358   camphor-menthol (SARNA) lotion, , Topical, PRN, Bonnielee Haff, MD   diphenhydrAMINE (BENADRYL) capsule 25 mg, 25 mg, Oral, Once, Bonnielee Haff, MD   hydrALAZINE (APRESOLINE) injection 10 mg, 10 mg, Intravenous, Q4H PRN, Paliwal, Aditya, MD   insulin aspart (novoLOG) injection 0-15 Units, 0-15 Units, Subcutaneous, TID WC, Camnitz, Will Hassell Done, MD, 2 Units at 08/17/22 1324   lidocaine (LIDODERM) 5 % 1 patch, 1 patch, Transdermal, Q24H, Bonnielee Haff, MD, 1 patch at 08/16/22 1722   melatonin tablet 3 mg, 3 mg, Oral, QHS, Bonnielee Haff, MD, 3 mg at 08/16/22 2037   Muscle Rub CREA, , Topical, PRN, Kristopher Oppenheim, DO   ondansetron [facility]) injection 4 mg, 4 mg, Intravenous, Q6H PRN, Camnitz, Will Hassell Done, MD   Oral care mouth rinse, 15 mL, Mouth Rinse, PRN, Irene Pap N, DO   oxyCODONE (Oxy IR/ROXICODONE) immediate release tablet 5 mg, 5 mg, Oral, Q4H PRN, Little Ishikawa, MD, 5 mg at 08/17/22 1127   polyethylene glycol (MIRALAX / GLYCOLAX) packet 17 g, 17 g, Oral, BID, Bonnielee Haff, MD, 17 g at 08/17/22 1128   prochlorperazine (COMPAZINE) injection 5 mg, 5 mg, Intravenous, Q6H PRN, [place] Crane, Carole N, DO, 5 mg at 08/11/22 1823   senna-docusate (Senokot-S) tablet 2 tablet, 2 tablet, Oral, BID, Bonnielee Haff, MD, 2 tablet at 08/17/22 1127   thyroid (ARMOUR)  tablet 120 mg, 120 mg, Oral, Daily, Hall, Carole N, DO, 120 mg at 08/17/22 1127  Patients Current Diet:  Diet Order             Diet - low sodium heart healthy           Diet Heart Room service appropriate? Yes; Fluid consistency: Thin  Diet effective now                   Precautions / Restrictions Precautions Precautions: Fall, Back, ICD/Pacemaker Precaution Booklet Issued: Yes (comment) Precaution Comments: continued education on precautions Restrictions Weight Bearing Restrictions: No LUE Weight Bearing: Non weight bearing Other Position/Activity Restrictions: pacemaker precautions L UE   Has the patient had 2 or more falls or a  fall with injury in the past year? Yes  Prior Activity Level  Pt. Active in the community PTA, going out 2-3x a week  Prior Functional Level Self Care: Did the patient need help bathing, dressing, using the toilet or eating? Needed some help  Indoor Mobility: Did the patient need assistance with walking from room to room (with or without device)? Independent  Stairs: Did the patient need assistance with internal or external stairs (with or without device)? Independent  Functional Cognition: Did the patient need help planning regular tasks such as shopping or remembering to take medications? Needed some help  Patient Information Are you of Hispanic, Latino/a,or Spanish origin?: A. No, not of Hispanic, Latino/a, or Spanish origin What is your race?: A. White (proxy) Do you need or want an interpreter to communicate with a doctor or health care staff?: 0. No  Patient's Response To:  Health Literacy and Transportation Is the patient able to respond to health literacy and transportation needs?: No Health Literacy - How often do you need to have someone help you when you read instructions, pamphlets, or other written material from your doctor or pharmacy?: Patient unable to respond (proxy) In the past 12 months, has lack of transportation kept  you from medical appointments or from getting medications?: No In the past 12 months, has lack of transportation kept you from meetings, work, or from getting things needed for daily living?: No  Development worker, international aid / [place] Devices/Equipment: Dentures (specify type), Hearing aid, Cane (specify quad or straight) (upper dentures) Home Equipment: Rolling Walker (2 wheels), Cane - single point  Prior Device Use: Indicate devices/aids used by the patient prior to current illness, exacerbation or injury? None of the above  Current Functional Level Cognition  Overall Cognitive Status: Impaired/Different from baseline Current Attention Level: Sustained Orientation Level: Oriented to person, Oriented to place, Oriented to time, Disoriented to situation Following Commands: Follows one step commands with increased time, Follows one step commands consistently Safety/Judgement: Decreased awareness of safety General Comments: continued cues/assist for L arm due to recent PPM, repeating his concern for how he is not doing as well as we want him to despite continued encouragement and reassurance    Extremity Assessment (includes Sensation/Coordination)  Upper Extremity Assessment: Generalized weakness, LUE deficits/detail LUE Deficits / Details: PPM precautions  Lower Extremity Assessment: Generalized weakness    ADLs  Overall ADL's : Needs assistance/impaired Grooming: Set up, Supervision/safety Upper Body Bathing: Minimal assistance Lower Body Bathing: Maximal assistance Upper Body Dressing : Moderate assistance Lower Body Dressing: Total assistance Toilet Transfer: Minimal assistance, +2 for safety/equipment, Ambulation, Rolling walker (2 wheels) Toileting- Clothing Manipulation and Hygiene: Maximal assistance Functional mobility during ADLs: Total assistance (for bed mobility; unable to stand)    Mobility  Overal bed mobility: Needs Assistance Bed Mobility: Rolling,  Sidelying to Sit, Sit to Sidelying Rolling: Max assist, +2 for safety/equipment Sidelying to sit: Max assist Sit to supine: Total assist, HOB elevated Sit to sidelying: Max assist, +2 for safety/equipment General bed mobility comments: increased time for each position due to pain in his back and pt trying to go slow; assist for lifting trunk from sidelying and for feet into bed to supine with increased time to turn to back, but eventually positioned semisidelying with pillow at his back    Transfers  Overall transfer level: Needs assistance Equipment used: Rolling walker (2 wheels) Transfers: Sit to/from Stand, Bed to chair/wheelchair/BSC Sit to Stand: Mod assist, +2 physical assistance Bed to/from chair/wheelchair/BSC  transfer type:: Step pivot Step pivot transfers: Mod assist General transfer comment: assist up from EOB to RW then up from recliner to RW, step pivot to bed from recliner with mod A    Ambulation / Gait / Stairs / Wheelchair Mobility  Ambulation/Gait Ambulation/Gait assistance: +2 safety/equipment, Min assist Gait Distance (Feet): 20 Feet Assistive device: Rolling walker (2 wheels) Gait Pattern/deviations: Step-to pattern, Trunk flexed, Decreased stride length General Gait Details: flexed posture throughout, assist for walker safety and chair follow for safety Gait velocity: reduced Gait velocity interpretation: <1.31 ft/sec, indicative of household ambulator    Posture / Balance Dynamic Sitting Balance Sitting balance - Comments: Pt preferring UE support to unweight back due to pain, needing reminders to not push through his L UE, but able to sit EOB statically with min guard and hands on his lap Balance Overall balance assessment: Needs assistance Sitting-balance support: Feet supported Sitting balance-Leahy Scale: Fair Sitting balance - Comments: Pt preferring UE support to unweight back due to pain, needing reminders to not push through his L UE, but able to sit  EOB statically with min guard and hands on his lap Standing balance support: Bilateral upper extremity supported, Reliant on assistive device for balance Standing balance-Leahy Scale: Poor Standing balance comment: Reliant on RW and external physical assistance. Pt able to stand at sink for >4 min to reach off BOS with 1 UE at a time to turn on sink and wet a cloth to wipe his buttocks x2 reps, up to [place] for stability during this task.    Special needs/care consideration Special service needs ***   Previous Home Environment (from acute therapy documentation) Living Arrangements: Spouse/significant other Available Help at Discharge: Family, Available 24 hours/day Type of Home: House Home Layout: Multi-level, Bed/bath upstairs Alternate Level Stairs-Rails: Right Alternate Level Stairs-Number of Steps: 7 Home Access: Stairs to enter Entrance Stairs-Rails: None Entrance Stairs-Number of Steps: 2 Bathroom Shower/Tub: Multimedia programmer: Standard Bathroom Accessibility: Yes How Accessible: Accessible via walker [place]: No  Discharge Living Setting Plans for Discharge Living Setting: Patient's home Type of Home at Discharge: House Discharge Home Layout: Multi-level, Bed/bath upstairs Alternate Level Stairs-Rails: Right Alternate Level Stairs-Number of Steps: 7 Discharge Home Access: Stairs to enter Entrance Stairs-Rails: None Entrance Stairs-Number of Steps: 2 Discharge Bathroom Shower/Tub: Walk-in shower Discharge Bathroom Toilet: Standard Discharge Bathroom Accessibility: Yes How Accessible: Accessible via walker Does the patient have any problems obtaining your medications?: No  Social/Family/Support Systems Patient Roles: Other (Comment) Anticipated Caregiver: Jaymie Misch (son) Ability/Limitations of Caregiver: Can Provide Min A Discharge Plan Discussed with Primary Caregiver: Yes Is Caregiver In Agreement with Plan?: Yes Does Caregiver/Family  have Issues with Lodging/Transportation while Pt is in Rehab?: No  Goals Patient/Family Goal for Rehab: PT/OT/SLP Min A Expected length of stay: 14-16 days Pt/Family Agrees to Admission and willing to participate: Yes Program Orientation Provided & Reviewed with Pt/Caregiver Including Roles  & Responsibilities: No  Decrease burden of Care through IP rehab admission: none   Possible need for SNF placement upon discharge: not anticipated   Patient Condition: I have reviewed medical records from [facility] , spoken with CM, and patient, spouse, son, daughter, and family member. I met with patient at the bedside for inpatient rehabilitation assessment.  Patient will benefit from ongoing PT, OT, and SLP, can actively participate in 3 hours of therapy a day 5 days of the week, and can make measurable gains during the admission.  Patient will also  benefit from the coordinated team approach during an Inpatient Acute Rehabilitation admission.  The patient will receive intensive therapy as well as Rehabilitation physician, nursing, social worker, and care management interventions.  Due to safety, skin/wound care, disease management, medication administration, pain management, and patient education the patient requires 24 hour a day rehabilitation nursing.  The patient is currently *** with mobility and basic ADLs.  Discharge setting and therapy post discharge at home with home health is anticipated.  Patient has agreed to participate in the Acute Inpatient Rehabilitation Program and will admit {Time; today/tomorrow:10263}.  Preadmission Screen Completed By:  Genella Mech, 08/17/2022 2:45 PM ______________________________________________________________________   Discussed status with Dr. Marland Kitchen on *** at *** and received approval for admission today.  Admission Coordinator:  Genella Mech, CCC-SLP, time Marland KitchenSudie Grumbling ***   Assessment/Plan: Diagnosis: Does the need for close, 24 hr/day Medical  supervision in concert with the patient's rehab needs make it unreasonable for this patient to be served in a less intensive setting? {yes_no_potentially:3041433} Co-Morbidities requiring supervision/potential complications: *** Due to {due FM:3846659}, does the patient require 24 hr/day rehab nursing? {yes_no_potentially:3041433} Does the patient require coordinated care of a physician, rehab nurse, PT, OT, and SLP to address physical and functional deficits in the context of the above medical diagnosis(es)? {yes_no_potentially:3041433} Addressing deficits in the following areas: {deficits:3041436} Can the patient actively participate in an intensive therapy program of at least 3 hrs of therapy 5 days a week? {yes_no_potentially:3041433} The potential for patient to make measurable gains while on inpatient rehab is {potential:3041437} Anticipated functional outcomes upon discharge from inpatient rehab: {functional outcomes:304600100} PT, {functional outcomes:304600100} OT, {functional outcomes:304600100} SLP Estimated rehab length of stay to reach the above functional goals is: *** Anticipated discharge destination: {anticipated dc setting:21604} 10. Overall Rehab/Functional Prognosis: {potential:3041437}   MD Signature: ***

## 2022-08-17 NOTE — Progress Notes (Signed)
PROGRESS NOTE    [name]  OYD:[id number] DOB: [birth year] DOA: 08/10/2022 PCP: [name], MD   Brief Narrative:  [name] is a 89 y.o. male with medical history significant for chronic dizziness/balance issues for at least 2 years, coronary artery disease status post PCI with stenting, paroxysmal A-fib, previously on Xarelto and no longer on DOAC after GI bleeding episodes, prostate cancer status post radical prostatectomy in 1998, hypertension, hyperlipidemia with intolerance to statin, history of carotid artery disease status post left carotid endarterectomy in 1998, OSA on CPAP, bilateral sensorineural hearing dysfunction, and TIA, who presented to [facility] ED from home via EMS due to multiple syncopal episodes at home.  Patient indicates that he had no prodrome or symptoms other than noticing that the room had moved and that approximately halfway through his fall he notes loss of consciousness and awoke on the floor with his wife over him.  Loss of consciousness may be a few minutes, no postictal or confusion once aroused.  Given second fall where he struck his head on the kitchen floor the wife called EMS for transport to the hospital. Imaging unremarkable for fracture or bleed or acute findings.  Hospitalist called for admission.  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  This was followed by placement of permanent pacemaker on 1/31. Seems to be stable from cardiac standpoint.  Telemetry reviewed.  Telemetry can be discontinued.   Elevated troponin, suspect demand ischemia in the setting of syncope Minimally elevated.  Likely due to demand ischemia.  Patient denies any chest pain. Echocardiogram showed LVEF to be 65 to 70%.  No regional wall motion abnormalities were noted.  Moderate aortic valve stenosis was seen.  Acute encephalopathy Likely secondary to second-degree heart block.  Patient  likely also has cognitive impairment at baseline.  Patient has a history of liver cirrhosis.  Ammonia level however was normal.   B12 level 3201, folate 8.1.  RPR is pending.  HIV is nonreactive. Further workup can be pursued in the outpatient setting.  Mentation is stable for the most part.   Prolonged Qtc Avoid QT prolonging medications.  Monitor electrolytes periodically.   Paroxysmal A-fib, previously on Xarelto  No longer on anticoagulation after history of GI bleeding episodes Not on rate control agents, rate is controlled   Elevated AST, bilirubin secondary to history of liver cirrhosis Ultrasound suggested liver cirrhosis.  Ammonia level noted to be normal.  Will need further evaluation and management and perhaps referral to gastroenterology as an outpatient.  Thrombocytopenia Secondary to liver cirrhosis.  No evidence for bleeding currently.  Macrocytic anemia Hemoglobin is stable.  Anemia panel does not show any clear deficiencies.  Macrocytosis is likely due to hypothyroidism.  Hypothyroidism TSH noted to be 12.47 with a free T4 of 0.82.  Currently on 120 mg of thyroid Armour.  Will recommend that TSH level be rechecked in 3 to 4 weeks before considering changes to his medication regimen.   CKD 3B Seems to be close to baseline.  Monitor urine output.  Avoid nephrotoxic agents.   Chronic diastolic CHF Euvolemic on exam.  Mild L1 vertebral compression fracture Incidentally noted on imaging studies.  No intervention at this time.  Conservative management with physical therapy and pain control.  Lidoderm patch has been prescribed.   DVT prophylaxis: SCDs Code Status: Full Family Communication: No family at bedside Disposition: CIR is recommended.  Status is: Inpatient Remains inpatient  appropriate because: Second-degree heart block   Consultants:  Cardiology, PCCM  Procedures:  None  Antimicrobials:  None indicated  Subjective: Awake and alert this morning.   Able to ambulate from bathroom to chair using a walker and with assistance.  Noted to be unsteady.  Objective: Vitals:   08/16/22 1500 08/16/22 1939 08/17/22 0307 08/17/22 0308  BP: 126/62 132/65 (!) 149/69   Pulse: 84 78 80   Resp: '18 20 19   '$ Temp: 99.1 F (37.3 C) 97.8 F (36.6 C) 97.7 F (36.5 C)   TempSrc: Axillary Oral Oral   SpO2: 92% 92% 94%   Weight:    93 kg  Height:        Intake/Output Summary (Last 24 hours) at 08/17/2022 1023 Last data filed at 08/17/2022 0313 Gross per 24 hour  Intake 840 ml  Output 750 ml  Net 90 ml    Filed Weights   08/15/22 0500 08/16/22 0333 08/17/22 0308  Weight: 91 kg 91.2 kg 93 kg    Examination:  General appearance: Awake alert.  In no distress Resp: Clear to auscultation bilaterally.  Normal effort Cardio: S1-S2 is normal regular.  No S3-S4.  No rubs murmurs or bruit GI: Abdomen is soft.  Nontender nondistended.  Bowel sounds are present normal.  No masses organomegaly Extremities: No edema.     Data Reviewed: I have personally reviewed following labs and imaging studies  CBC: Recent Labs  Lab 08/10/22 1645 08/11/22 0242 08/12/22 0107 08/13/22 0243 08/14/22 0743 08/15/22 0432  WBC 4.4 6.7 7.9 8.3 8.2 7.8  NEUTROABS 2.4  --   --   --   --   --   HGB 13.6 14.0 14.3 13.1 12.1* 12.3*  HCT 39.3 40.1 40.6 37.0* 35.7* 33.5*  MCV 102.3* 102.8* 101.5* 102.5* 103.5* 100.6*  PLT 95* 88* 99* 98* 95* 106*    Basic Metabolic Panel: Recent Labs  Lab 08/11/22 0242 08/12/22 0107 08/13/22 0243 08/14/22 0743 08/15/22 0432 08/17/22 0608  NA 136 137 137 136 134* 133*  K 4.0 4.5 3.8 4.0 3.9 4.0  CL 105 101 106 105 104 101  CO2 22 27 21* 21* 23 25  GLUCOSE 137* 133* 129* 119* 116* 112*  BUN '13 16 22 '$ 27* 28* 22  CREATININE 1.36* 1.48* 1.59* 1.47* 1.36* 1.38*  CALCIUM 8.9 9.1 8.7* 8.7* 8.6* 8.5*  MG 1.8  --  1.9  --   --   --   PHOS 3.0  --   --   --   --   --     GFR: Estimated Creatinine Clearance: 40.3 mL/min (A) (by C-G  formula based on SCr of 1.38 mg/dL (H)). Liver Function Tests: Recent Labs  Lab 08/11/22 0242 08/12/22 0107 08/13/22 0243 08/14/22 0743 08/15/22 0432  AST 52* 61* 34 36 34  ALT '25 30 20 15 16  '$ ALKPHOS 87 89 74 81 78  BILITOT 4.1* 4.6* 3.1* 3.0* 2.5*  PROT 6.6 6.6 6.4* 6.1* 6.2*  ALBUMIN 3.5 3.5 3.1* 3.0* 2.9*     CBG: Recent Labs  Lab 08/16/22 0750 08/16/22 1307 08/16/22 1627 08/16/22 2113 08/17/22 0833  GLUCAP 113* 92 134* 93 204*      Recent Results (from the past 240 hour(s))  Resp panel by RT-PCR (RSV, Flu A&B, Covid) Anterior Nasal Swab     Status: None   Collection Time: 08/10/22  5:00 PM   Specimen: Anterior Nasal Swab  Result Value Ref Range Status   SARS Coronavirus 2  by RT PCR NEGATIVE NEGATIVE Final    Comment: (NOTE) SARS-CoV-2 target nucleic acids are NOT DETECTED.  The SARS-CoV-2 RNA is generally detectable in upper respiratory specimens during the acute phase of infection. The lowest concentration of SARS-CoV-2 viral copies this assay can detect is 138 copies/mL. A negative result does not preclude SARS-Cov-2 infection and should not be used as the sole basis for treatment or other patient management decisions. A negative result may occur with  improper specimen collection/handling, submission of specimen other than nasopharyngeal swab, presence of viral mutation(s) within the areas targeted by this assay, and inadequate number of viral copies(<138 copies/mL). A negative result must be combined with clinical observations, patient history, and epidemiological information. The expected result is Negative.  Fact Sheet for Patients:  EntrepreneurPulse.com.au  Fact Sheet for Healthcare Providers:  IncredibleEmployment.be  This test is no t yet approved or cleared by the [place] FDA and  has been authorized for detection and/or diagnosis of SARS-CoV-2 by FDA under an Emergency Use Authorization (EUA). This  EUA will remain  in effect (meaning this test can be used) for the duration of the COVID-19 declaration under Section 564(b)(1) of the Act, 21 U.S.C.section 360bbb-3(b)(1), unless the authorization is terminated  or revoked sooner.       Influenza A by PCR NEGATIVE NEGATIVE Final   Influenza B by PCR NEGATIVE NEGATIVE Final    Comment: (NOTE) The Xpert Xpress SARS-CoV-2/FLU/RSV plus assay is intended as an aid in the diagnosis of influenza from Nasopharyngeal swab specimens and should not be used as a sole basis for treatment. Nasal washings and aspirates are unacceptable for Xpert Xpress SARS-CoV-2/FLU/RSV testing.  Fact Sheet for Patients: EntrepreneurPulse.com.au  Fact Sheet for Healthcare Providers: IncredibleEmployment.be  This test is not yet approved or cleared by the [place] FDA and has been authorized for detection and/or diagnosis of SARS-CoV-2 by FDA under an Emergency Use Authorization (EUA). This EUA will remain in effect (meaning this test can be used) for the duration of the COVID-19 declaration under Section 564(b)(1) of the Act, 21 U.S.C. section 360bbb-3(b)(1), unless the authorization is terminated or revoked.     Resp Syncytial Virus by PCR NEGATIVE NEGATIVE Final    Comment: (NOTE) Fact Sheet for Patients: EntrepreneurPulse.com.au  Fact Sheet for Healthcare Providers: IncredibleEmployment.be  This test is not yet approved or cleared by the [place] FDA and has been authorized for detection and/or diagnosis of SARS-CoV-2 by FDA under an Emergency Use Authorization (EUA). This EUA will remain in effect (meaning this test can be used) for the duration of the COVID-19 declaration under Section 564(b)(1) of the Act, 21 U.S.C. section 360bbb-3(b)(1), unless the authorization is terminated or revoked.  Performed at [facility], [place] [address], [place],   [address]   MRSA Next Gen by PCR, Nasal     Status: None   Collection Time: 08/11/22  8:26 PM   Specimen: Nasal Mucosa; Nasal Swab  Result Value Ref Range Status   MRSA by PCR Next Gen NOT DETECTED NOT DETECTED Final    Comment: (NOTE) The GeneXpert MRSA Assay (FDA approved for NASAL specimens only), is one component of a comprehensive MRSA colonization surveillance program. It is not intended to diagnose MRSA infection nor to guide or monitor treatment for MRSA infections. Test performance is not FDA approved in patients less than 48 years old. Performed at [facility], [place] [address], [place],  [address]   Surgical PCR screen  Status: None   Collection Time: 08/12/22  1:04 PM   Specimen: Nasal Mucosa; Nasal Swab  Result Value Ref Range Status   MRSA, PCR NEGATIVE NEGATIVE Final   Staphylococcus aureus NEGATIVE NEGATIVE Final    Comment: (NOTE) The Xpert SA Assay (FDA approved for NASAL specimens in patients 25 years of age and older), is one component of a comprehensive surveillance program. It is not intended to diagnose infection nor to guide or monitor treatment. Performed at [facility], [place] [address] [place], [place] [address]          Radiology Studies: No results found.   Scheduled Meds:  insulin aspart  0-15 Units Subcutaneous TID WC   lidocaine  1 patch Transdermal Q24H   melatonin  3 mg Oral QHS   polyethylene glycol  17 g Oral BID   senna-docusate  2 tablet Oral BID   thyroid  120 mg Oral Daily   Continuous Infusions:     LOS: 6 days    [facility]  If 7PM-7AM, please contact night-coverage [email or web address]  08/17/2022, 10:23 AM

## 2022-08-17 NOTE — Plan of Care (Signed)
  Problem: Clinical Measurements: Goal: Ability to maintain clinical measurements within normal limits will improve Outcome: Progressing Goal: Will remain free from infection Outcome: Progressing Goal: Respiratory complications will improve Outcome: Progressing Goal: Cardiovascular complication will be avoided Outcome: Progressing   Problem: Activity: Goal: Risk for activity intolerance will decrease Outcome: Progressing   Problem: Coping: Goal: Level of anxiety will decrease Outcome: Progressing   Problem: Cardiac: Goal: Ability to achieve and maintain adequate cardiopulmonary perfusion will improve Outcome: Progressing

## 2022-08-17 NOTE — Care Management Important Message (Signed)
Important Message  Patient Details  Name: [name] MRN: [id number] Date of Birth: 06/24/1934   Medicare Important Message Given:  Yes     Shelda Altes 08/17/2022, 10:38 AM

## 2022-08-17 NOTE — Progress Notes (Signed)
Inpatient Rehab Admissions Coordinator:   MD completed a peer to peer for CIR with pt.'s insurance and was told case was denied. Await official denial and will then file appeal (family requests this).   Clemens Catholic, [place], [place] Admissions Coordinator  [phone] ([place]) [phone] (office)

## 2022-08-18 DIAGNOSIS — I441 Atrioventricular block, second degree: Secondary | ICD-10-CM | POA: Diagnosis not present

## 2022-08-18 LAB — GLUCOSE, CAPILLARY
Glucose-Capillary: 111 mg/dL — ABNORMAL HIGH (ref 70–99)
Glucose-Capillary: 135 mg/dL — ABNORMAL HIGH (ref 70–99)
Glucose-Capillary: 104 mg/dL — ABNORMAL HIGH (ref 70–99)
Glucose-Capillary: 131 mg/dL — ABNORMAL HIGH (ref 70–99)
Glucose-Capillary: 177 mg/dL — ABNORMAL HIGH (ref 70–99)
Glucose-Capillary: 129 mg/dL — ABNORMAL HIGH (ref 70–99)

## 2022-08-18 MED ORDER — OXYCODONE HCL 5 MG PO TABS: 5.0000 mg | ORAL_TABLET | ORAL | 0 refills | Status: DC | PRN

## 2022-08-18 MED ORDER — LIDOCAINE 5 % EX PTCH: 1.0000 | MEDICATED_PATCH | CUTANEOUS | 0 refills | Status: DC

## 2022-08-18 MED ORDER — SENNOSIDES-DOCUSATE SODIUM 8.6-50 MG PO TABS: 2.0000 | ORAL_TABLET | Freq: Two times a day (BID) | ORAL | Status: DC

## 2022-08-18 MED ORDER — CAMPHOR-MENTHOL 0.5-0.5 % EX LOTN: TOPICAL_LOTION | CUTANEOUS | 0 refills | Status: DC | PRN

## 2022-08-18 MED ORDER — MELATONIN 3 MG PO TABS: 3.0000 mg | ORAL_TABLET | Freq: Every day | ORAL | 0 refills | Status: DC

## 2022-08-18 MED ORDER — POLYETHYLENE GLYCOL 3350 17 G PO PACK: 17.0000 g | PACK | Freq: Two times a day (BID) | ORAL | 0 refills | Status: DC

## 2022-08-18 NOTE — Progress Notes (Signed)
Inpatient Rehab Admissions Coordinator:   Appeal filed for CIR. Daughter notified.   Clemens Catholic, [place], [place] Admissions Coordinator  [phone] ([place]) [phone] (office)

## 2022-08-18 NOTE — Progress Notes (Signed)
Occupational Therapy Treatment Patient Details Name: [name] MRN: [id number] DOB: [birth year] Today's Date: 08/18/2022   History of present illness [name] is an 89 y.o. male who presented 08/10/22 to the emergency department after 2 syncope episodes in 24 hours. CT head without acute intracranial findings. 2x ventricular standstill/pauses noted since admission 20-30 seconds. Temp pacer placed 1/30. S/P PPM 1/31. Imaging 2/1 revealed: "acute vertebral compression fracture at the thoracolumbar  junction, new from the prior thoracic and lumbar spine CT of  08/10/2022". PMH: TIA, hypertension, hyperlipidemia, caroitd artery occlusion, CAD, prostate CA, tinnitus   OT comments  Patient progressing towards OT goals slowly.  Pt remains limited by back pain and cognition.  He is oriented to self only, but is easily redirectable and require cueing for precautions throughout session.  Completing bed mobility with min assist, transfers with min assist and side stepping to [facility] with min assist.  Pt continues to require mod-total assist for ADLs, difficulty completing sitting unsupported due to back pain.  Son present and providing encouragement throughout. Updated dc plan to AIR at this time, in hopes to decrease burden of care and dc home to familiar environment.  Will follow acutely.    Recommendations for follow up therapy are one component of a multi-disciplinary discharge planning process, led by the attending physician.  Recommendations may be updated based on patient status, additional functional criteria and insurance authorization.    Follow Up Recommendations  Acute inpatient rehab (3hours/day)     Assistance Recommended at Discharge Frequent or constant Supervision/Assistance  Patient can return home with the following  A lot of help with walking and/or transfers;A lot of help with bathing/dressing/bathroom;Direct supervision/assist for medications management;Direct supervision/assist for  financial management;Assist for transportation;Help with stairs or ramp for entrance;Assistance with cooking/housework   Equipment Recommendations  BSC/3in1;Hospital bed    Recommendations for Other Services Rehab consult    Precautions / Restrictions Precautions Precautions: Fall;Back;ICD/Pacemaker Precaution Comments: continued education on precautions Required Braces or Orthoses:  (conservative mgmt for compression fx) Restrictions Weight Bearing Restrictions: No Other Position/Activity Restrictions: pacemaker precautions L UE       Mobility Bed Mobility Overal bed mobility: Needs Assistance Bed Mobility: Rolling, Sidelying to Sit, Sit to Sidelying Rolling: Min assist Sidelying to sit: Min assist     Sit to sidelying: Min assist General bed mobility comments: pt sidelying on L side, with cueing for technique and increased time pt able to transition to EOB with min assist for trunk support to ascend.  Returned to bed with min assist for trunk and LEs but greatly increased time.    Transfers Overall transfer level: Needs assistance Equipment used: Rolling walker (2 wheels) Transfers: Sit to/from Stand, Bed to chair/wheelchair/BSC Sit to Stand: Min assist           General transfer comment: min assist to power up from EOB with cueing for hand placement but preference to keep both hands on RW, son present for encouragement.     Balance Overall balance assessment: Needs assistance Sitting-balance support: Feet supported Sitting balance-Leahy Scale: Fair Sitting balance - Comments: preference to UE support to off weight his back, cueing to keep LUE in lap but relies on R UE support   Standing balance support: Bilateral upper extremity supported, During functional activity Standing balance-Leahy Scale: Poor Standing balance comment: RW and external support  ADL either performed or assessed with clinical judgement   ADL Overall  ADL's : Needs assistance/impaired     Grooming: Set up;Supervision/safety;Bed level               Lower Body Dressing: Total assistance;Sit to/from stand   Toilet Transfer: Minimal assistance;Ambulation;Rolling walker (2 wheels) Toilet Transfer Details (indicate cue type and reason): simulated side stepping towards [facility] Toileting- Clothing Manipulation and Hygiene: Total assistance;Sit to/from stand       Functional mobility during ADLs: Minimal assistance;Moderate assistance;Rolling walker (2 wheels);Cueing for safety;Cueing for sequencing      Extremity/Trunk Assessment              Vision       Perception     Praxis      Cognition Arousal/Alertness: Awake/alert Behavior During Therapy: Anxious Overall Cognitive Status: Impaired/Different from baseline Area of Impairment: Attention, Memory, Following commands, Safety/judgement, Awareness, Problem solving, Orientation                 Orientation Level: Disoriented to, Place, Time, Situation Current Attention Level: Sustained Memory: Decreased short-term memory, Decreased recall of precautions Following Commands: Follows one step commands with increased time, Follows one step commands consistently Safety/Judgement: Decreased awareness of safety, Decreased awareness of deficits Awareness: Intellectual Problem Solving: Slow processing, Decreased initiation, Difficulty sequencing, Requires verbal cues, Requires tactile cues General Comments: pt oriented to self only, pleasant and follows commands with increased time.  He demonstrates poor awareness to safety, precautions and deficits needing constant cueing        Exercises      Shoulder Instructions       General Comments VSS    Pertinent Vitals/ Pain       Pain Assessment Pain Assessment: Faces Faces Pain Scale: Hurts even more Pain Location: back Pain Descriptors / Indicators: Discomfort, Grimacing, Guarding, Moaning Pain Intervention(s):  Limited activity within patient's tolerance, Monitored during session, Repositioned  Home Living                                          Prior Functioning/Environment              Frequency  Min 2X/week        Progress Toward Goals  OT Goals(current goals can now be found in the care plan section)  Progress towards OT goals: Progressing toward goals  Acute Rehab OT Goals Patient Stated Goal: less pain OT Goal Formulation: With patient/family Time For Goal Achievement: 08/27/22 Potential to Achieve Goals: [place] Discharge plan needs to be updated;Frequency remains appropriate    Co-evaluation                 AM-PAC OT "6 Clicks" Daily Activity     Outcome Measure   Help from another person eating meals?: A Little Help from another person taking care of personal grooming?: A Little Help from another person toileting, which includes using toliet, bedpan, or urinal?: Total Help from another person bathing (including washing, rinsing, drying)?: A Lot Help from another person to put on and taking off regular upper body clothing?: A Lot Help from another person to put on and taking off regular lower body clothing?: Total 6 Click Score: 12    End of Session Equipment Utilized During Treatment: Gait belt;Rolling walker (2 wheels)  OT Visit Diagnosis: Unsteadiness on feet (R26.81);Other abnormalities of gait and mobility (  R26.89);Muscle weakness (generalized) (M62.81);History of falling (Z91.81);Other symptoms and signs involving cognitive function;Pain Pain - part of body:  (back)   Activity Tolerance Patient limited by pain   Patient Left in bed;with call bell/phone within reach;with bed alarm set;with nursing/sitter in room;with family/visitor present   Nurse Communication Mobility status;Precautions        Time: 9323-5573 OT Time Calculation (min): 45 min  Charges: OT General Charges $OT Visit: 1 Visit OT Treatments $Self  Care/Home Management : 38-52 mins  [place] Office [phone]   Delight Stare 08/18/2022, 9:36 AM

## 2022-08-18 NOTE — Progress Notes (Signed)
Report called in and given to [facility], [place]

## 2022-08-18 NOTE — Progress Notes (Signed)
Pt refused to wear CPAP tonight. ?

## 2022-08-18 NOTE — Discharge Summary (Signed)
[facility]  Physician Discharge Summary   Patient ID: [name] MRN: [id number] DOB/AGE: 1934-01-26 89 y.o.  Admit date: 08/10/2022 Discharge date:   08/18/2022   PCP: [name], MD  DISCHARGE DIAGNOSES:    Second degree heart block   Uncontrolled type 2 diabetes mellitus with hyperglycemia, without long-term current use of insulin (HCC)   GERD without esophagitis   OSA (obstructive sleep apnea)   Hypothyroidism, acquired   Essential hypertension   Hyperlipemia   Chronic kidney disease, stage 3b (HCC)   AF (paroxysmal atrial fibrillation) ([place])   Coronary artery disease involving native coronary artery of native heart   Cardiac syncope   RECOMMENDATIONS FOR OUTPATIENT FOLLOW UP: Patient has an appointment on 2/14 at 8:40 AM for pacemaker wound check at the cardiovascular disease office on [facility] Please check CBC and basic metabolic panel in 3 to 4 days Please check TSH and free T4 in 3 to 4 weeks   Home Health: SNF versus CIR Equipment/Devices: None  CODE STATUS: Full code  DISCHARGE CONDITION: fair  Diet recommendation: Heart healthy  INITIAL HISTORY: 89 y.o. male with medical history significant for chronic dizziness/balance issues for at least 2 years, coronary artery disease status post PCI with stenting, paroxysmal A-fib, previously on Xarelto and no longer on DOAC after GI bleeding episodes, prostate cancer status post radical prostatectomy in 1998, hypertension, hyperlipidemia with intolerance to statin, history of carotid artery disease status post left carotid endarterectomy in 1998, OSA on CPAP, bilateral sensorineural hearing dysfunction, and TIA, who presented to [facility] ED from home via EMS due to multiple syncopal episodes at home.  Patient indicates that he had no prodrome or symptoms other than noticing that the room had moved and that approximately halfway through his fall he notes loss of consciousness and awoke on the floor with his  wife over him.  Loss of consciousness may be a few minutes, no postictal or confusion once aroused.  Given second fall where he struck his head on the kitchen floor the wife called EMS for transport to the hospital. Imaging unremarkable for fracture or bleed or acute findings.  Hospitalist called for admission.   Consultations: Cardiology  Procedures: Pacemaker placement   HOSPITAL COURSE:   Syncope secondary to second-degree heart block  Patient underwent temporary pacemaker placement initially.  This was followed by placement of permanent pacemaker on 1/31. Seems to be stable from cardiac standpoint.  Cardiology to arrange outpatient follow-up.   Elevated troponin, suspect demand ischemia in the setting of syncope Minimally elevated.  Likely due to demand ischemia.  Patient denies any chest pain. Echocardiogram showed LVEF to be 65 to 70%.  No regional wall motion abnormalities were noted.  Moderate aortic valve stenosis was seen.   Acute encephalopathy Likely secondary to second-degree heart block.  Patient likely also has cognitive impairment at baseline.  Patient has a history of liver cirrhosis.  Ammonia level however was normal.   B12 level 3201, folate 8.1.  RPR is nonreactive.  HIV is nonreactive. Further workup can be pursued in the outpatient setting.  Mentation is stable for the most part.   Prolonged Qtc Avoid QT prolonging medications.  Monitor electrolytes periodically.   Paroxysmal A-fib, previously on Xarelto  No longer on anticoagulation after history of GI bleeding episodes Not on rate control agents, rate is controlled   Elevated AST, bilirubin secondary to history of liver cirrhosis Ultrasound suggested liver cirrhosis.  Ammonia level noted to be normal.  Will need  further evaluation and management and perhaps referral to gastroenterology as an outpatient.   Thrombocytopenia Secondary to liver cirrhosis.  No evidence for bleeding currently.   Macrocytic  anemia Hemoglobin is stable.  Anemia panel does not show any clear deficiencies.  Macrocytosis is likely due to hypothyroidism.   Hypothyroidism TSH noted to be 12.47 with a free T4 of 0.82.  Currently on 120 mg of thyroid Armour.  Will recommend that TSH level be rechecked in 3 to 4 weeks before considering changes to his medication regimen.   CKD 3B Seems to be close to baseline.  Monitor urine output.  Avoid nephrotoxic agents.   Chronic diastolic CHF Euvolemic on exam.   Mild L1 vertebral compression fracture Incidentally noted on imaging studies.  No intervention at this time.  Conservative management with physical therapy and pain control.  Lidoderm patch has been prescribed.  Obesity Estimated body mass index is 31.8 kg/m as calculated from the following:   Height as of this encounter: '5\' 6"'$  (1.676 m).   Weight as of this encounter: 89.4 kg.  Patient is stable.  Okay for discharge to SNF for inpatient rehabilitation.   PERTINENT LABS:  The results of significant diagnostics from this hospitalization (including imaging, microbiology, ancillary and laboratory) are listed below for reference.    Microbiology: Recent Results (from the past 240 hour(s))  Resp panel by RT-PCR (RSV, Flu A&B, Covid) Anterior Nasal Swab     Status: None   Collection Time: 08/10/22  5:00 PM   Specimen: Anterior Nasal Swab  Result Value Ref Range Status   SARS Coronavirus 2 by RT PCR NEGATIVE NEGATIVE Final    Comment: (NOTE) SARS-CoV-2 target nucleic acids are NOT DETECTED.  The SARS-CoV-2 RNA is generally detectable in upper respiratory specimens during the acute phase of infection. The lowest concentration of SARS-CoV-2 viral copies this assay can detect is 138 copies/mL. A negative result does not preclude SARS-Cov-2 infection and should not be used as the sole basis for treatment or other patient management decisions. A negative result may occur with  improper specimen  collection/handling, submission of specimen other than nasopharyngeal swab, presence of viral mutation(s) within the areas targeted by this assay, and inadequate number of viral copies(<138 copies/mL). A negative result must be combined with clinical observations, patient history, and epidemiological information. The expected result is Negative.  Fact Sheet for Patients:  EntrepreneurPulse.com.au  Fact Sheet for Healthcare Providers:  IncredibleEmployment.be  This test is no t yet approved or cleared by the [place] FDA and  has been authorized for detection and/or diagnosis of SARS-CoV-2 by FDA under an Emergency Use Authorization (EUA). This EUA will remain  in effect (meaning this test can be used) for the duration of the COVID-19 declaration under Section 564(b)(1) of the Act, 21 U.S.C.section 360bbb-3(b)(1), unless the authorization is terminated  or revoked sooner.       Influenza A by PCR NEGATIVE NEGATIVE Final   Influenza B by PCR NEGATIVE NEGATIVE Final    Comment: (NOTE) The Xpert Xpress SARS-CoV-2/FLU/RSV plus assay is intended as an aid in the diagnosis of influenza from Nasopharyngeal swab specimens and should not be used as a sole basis for treatment. Nasal washings and aspirates are unacceptable for Xpert Xpress SARS-CoV-2/FLU/RSV testing.  Fact Sheet for Patients: EntrepreneurPulse.com.au  Fact Sheet for Healthcare Providers: IncredibleEmployment.be  This test is not yet approved or cleared by the [place] FDA and has been authorized for detection and/or diagnosis of SARS-CoV-2 by FDA under an  Emergency Use Authorization (EUA). This EUA will remain in effect (meaning this test can be used) for the duration of the COVID-19 declaration under Section 564(b)(1) of the Act, 21 U.S.C. section 360bbb-3(b)(1), unless the authorization is terminated or revoked.     Resp Syncytial  Virus by PCR NEGATIVE NEGATIVE Final    Comment: (NOTE) Fact Sheet for Patients: EntrepreneurPulse.com.au  Fact Sheet for Healthcare Providers: IncredibleEmployment.be  This test is not yet approved or cleared by the [place] FDA and has been authorized for detection and/or diagnosis of SARS-CoV-2 by FDA under an Emergency Use Authorization (EUA). This EUA will remain in effect (meaning this test can be used) for the duration of the COVID-19 declaration under Section 564(b)(1) of the Act, 21 U.S.C. section 360bbb-3(b)(1), unless the authorization is terminated or revoked.  Performed at [facility], [place] [address] [place], [place] [address]   MRSA Next Gen by PCR, Nasal     Status: None   Collection Time: 08/11/22  8:26 PM   Specimen: Nasal Mucosa; Nasal Swab  Result Value Ref Range Status   MRSA by PCR Next Gen NOT DETECTED NOT DETECTED Final    Comment: (NOTE) The GeneXpert MRSA Assay (FDA approved for NASAL specimens only), is one component of a comprehensive MRSA colonization surveillance program. It is not intended to diagnose MRSA infection nor to guide or monitor treatment for MRSA infections. Test performance is not FDA approved in patients less than 55 years old. Performed at [facility], [place] [address] [place], [place] [address]   Surgical PCR screen     Status: None   Collection Time: 08/12/22  1:04 PM   Specimen: Nasal Mucosa; Nasal Swab  Result Value Ref Range Status   MRSA, PCR NEGATIVE NEGATIVE Final   Staphylococcus aureus NEGATIVE NEGATIVE Final    Comment: (NOTE) The Xpert SA Assay (FDA approved for NASAL specimens in patients 78 years of age and older), is one component of a comprehensive surveillance program. It is not intended to diagnose infection nor to guide or monitor treatment. Performed at [facility], [place] [address], [place], [place] [address]      Labs:   Basic Metabolic  Panel: Recent Labs  Lab 08/12/22 0107 08/13/22 0243 08/14/22 0743 08/15/22 0432 08/17/22 0608  NA 137 137 136 134* 133*  K 4.5 3.8 4.0 3.9 4.0  CL 101 106 105 104 101  CO2 27 21* 21* 23 25  GLUCOSE 133* 129* 119* 116* 112*  BUN 16 22 27* 28* 22  CREATININE 1.48* 1.59* 1.47* 1.36* 1.38*  CALCIUM 9.1 8.7* 8.7* 8.6* 8.5*  MG  --  1.9  --   --   --    Liver Function Tests: Recent Labs  Lab 08/12/22 0107 08/13/22 0243 08/14/22 0743 08/15/22 0432  AST 61* 34 36 34  ALT '30 20 15 16  '$ ALKPHOS 89 74 81 78  BILITOT 4.6* 3.1* 3.0* 2.5*  PROT 6.6 6.4* 6.1* 6.2*  ALBUMIN 3.5 3.1* 3.0* 2.9*    Recent Labs  Lab 08/15/22 0432  AMMONIA 29   CBC: Recent Labs  Lab 08/12/22 0107 08/13/22 0243 08/14/22 0743 08/15/22 0432  WBC 7.9 8.3 8.2 7.8  HGB 14.3 13.1 12.1* 12.3*  HCT 40.6 37.0* 35.7* 33.5*  MCV 101.5* 102.5* 103.5* 100.6*  PLT 99* 98* 95* 106*    CBG: Recent Labs  Lab 08/17/22 0833 08/17/22 1224 08/17/22 1649 08/17/22 2113 08/18/22 0729  GLUCAP 204* 121* 116* 114*  111*     IMAGING STUDIES DG Chest 2 View  Result Date: 08/13/2022 CLINICAL DATA:  Provided history: Cardiac device in-situ. Pacemaker implant. EXAM: CHEST - 2 VIEW COMPARISON:  Prior chest radiographs 08/11/2022 and earlier. Thoracic and lumbar spine CT 08/10/2022. FINDINGS: Shallow inspiration radiograph. Left chest dual lead implantable cardiac device with leads terminating in the regions of the right atrium and right ventricle. The cardiomediastinal silhouette is unchanged. Aortic atherosclerosis. Prominence of the intra lung markings compatible with interstitial edema. Possible trace bilateral pleural effusions. No evidence of pneumothorax. Vertebral compression fracture at the thoracolumbar junction, new from the prior thoracic spine CT of 08/10/2022 and likely acute. Impression #6 will be called to the ordering clinician or representative by the Radiologist Assistant, and communication documented in  the PACS or [employer]. IMPRESSION: 1. Shallow inspiration radiograph. 2. Left chest dual-lead implantable cardiac device with leads terminating in the regions of the right atrium and right ventricle. 3. Pulmonary interstitial edema. 4. Possible trace bilateral pleural effusions. 5. Aortic Atherosclerosis (ICD10-I70.0). 6. Acute vertebral compression fracture at the thoracolumbar junction, new from the prior thoracic and lumbar spine CT of 08/10/2022. The posterior elements are excluded from the field of view at this level, and dedicated thoracic and lumbar spine radiographs are recommended for further evaluation. Electronically Signed   By: Kellie Simmering D.O.   On: 08/13/2022 08:23   EP PPM/ICD IMPLANT  Result Date: 08/12/2022 SURGEON:  Allegra Lai, MD   PREPROCEDURE DIAGNOSIS:  complete AV block   POSTPROCEDURE DIAGNOSIS:  complete AV block    PROCEDURES:  1. Pacemaker implantation.   INTRODUCTION:  [name] is a 89 y.o. male with a history of bradycardia who presents today for pacemaker implantation.  The patient reports intermittent episodes of dizziness over the past few months.  No reversible causes have been identified.  The patient therefore presents today for pacemaker implantation.   DESCRIPTION OF PROCEDURE:  Informed written consent was obtained, and  the patient was brought to the electrophysiology lab in a fasting state.  The patient required no sedation for the procedure today.  The patients left chest was prepped and draped in the usual sterile fashion by the EP lab staff. The skin overlying the left deltopectoral region was infiltrated with lidocaine for local analgesia.  A 4-cm incision was made over the left deltopectoral region.  A left subcutaneous pacemaker pocket was fashioned using a combination of sharp and blunt dissection. Electrocautery was required to assure hemostasis.  RA/RV Lead Placement: The left axillary vein was therefore cannulated.  Through the left axillary vein,  a Abbott Ultipace 1231-52  (serial number  [id number]) right atrial lead and an Abbott Ultipace 1231-65 (serial number  [id number]) right ventricular lead were advanced with fluoroscopic visualization into the right atrial appendage and right ventricular apex positions respectively.  Initial atrial lead P- waves measured 4.6 mV with impedance of 587 ohms and a threshold of 1 V at 0.5 msec.  Right ventricular lead R-waves paced from temp wire with an impedance of 674 ohms and a threshold of 1.2 V at 0.5 msec.  Both leads were secured to the pectoralis fascia using #2-0 silk over the suture sleeves. Device Placement:  The leads were then connected to an [place]  (serial number  [id number] ) pacemaker.  The pocket was irrigated with copious gentamicin solution.  The pacemaker was then placed into the pocket.  The pocket was then closed in 3 layers with 2.0 Vicryl suture for  the 3.0 Vicryl suture subcutaneous and subcuticular layers.  Steri-  Strips and a sterile dressing were then applied. EBL<27m.  There were no early apparent complications.   CONCLUSIONS:  1. Successful implantation of a Abbott Assurity [id number]-chamber pacemaker for symptomatic bradycardia  2. No early apparent complications.       Will CCurt Bears MD 08/12/2022 12:16 PM  ECHOCARDIOGRAM COMPLETE  Result Date: 08/12/2022    ECHOCARDIOGRAM REPORT   Patient Name:   Zachary SNOWDONDate of Exam: 08/12/2022 Medical Rec #:  [id number]   Height:       66.0 in Accession #:    [id number]  Weight:       203.3 lb Date of Birth:  [birth year]   BSA:          2.014 m Patient Age:    89years     BP:           162/59 mmHg Patient Gender: M            HR:           60 bpm. Exam Location:  Inpatient Procedure: 2D Echo, Cardiac Doppler, Color Doppler and Intracardiac            Opacification Agent Indications:    Syncope R55  History:        Patient has prior history of Echocardiogram examinations, most                 recent 02/02/2022. Angina and CAD,  TIA, Arrythmias:Atrial                 Fibrillation, Signs/Symptoms:Chest Pain and Syncope; Risk                 Factors:Hypertension, Sleep Apnea, Diabetes and Dyslipidemia.                 CKD, stage III.  Sonographer:    SRonny FlurryReferring Phys: [id number]es 1. Left ventricular ejection fraction, by estimation, is 65 to 70%. The left ventricle has normal function. The left ventricle has no regional wall motion abnormalities. Left ventricular diastolic parameters are indeterminate.  2. Right ventricular systolic function is normal. The right ventricular size is normal. There is normal pulmonary artery systolic pressure.  3. The mitral valve is degenerative. No evidence of mitral valve regurgitation. No evidence of mitral stenosis. Moderate to severe mitral annular calcification.  4. The aortic valve is tricuspid. There is moderate calcification of the aortic valve. There is moderate thickening of the aortic valve. Aortic valve regurgitation is trivial. Moderate aortic valve stenosis. Aortic valve area, by VTI measures 1.24 cm. Aortic valve mean gradient measures 21.5 mmHg. Aortic valve Vmax measures 3.15 m/s.  5. The inferior vena cava is dilated in size with >50% respiratory variability, suggesting right atrial pressure of 8 mmHg. FINDINGS  Left Ventricle: Left ventricular ejection fraction, by estimation, is 65 to 70%. The left ventricle has normal function. The left ventricle has no regional wall motion abnormalities. Definity contrast agent was given IV to delineate the left ventricular  endocardial borders. The left ventricular internal cavity size was normal in size. There is no left ventricular hypertrophy. Left ventricular diastolic parameters are indeterminate. Right Ventricle: The right ventricular size is normal. No increase in right ventricular wall thickness. Right ventricular systolic function is normal. There is normal pulmonary artery systolic pressure. The tricuspid  regurgitant velocity is 1.51 m/s, and  with an assumed  right atrial pressure of 8 mmHg, the estimated right ventricular systolic pressure is 20.9 mmHg. Left Atrium: Left atrial size was normal in size. Right Atrium: Right atrial size was normal in size. Pericardium: There is no evidence of pericardial effusion. Mitral Valve: The mitral valve is degenerative in appearance. Moderate to severe mitral annular calcification. No evidence of mitral valve regurgitation. No evidence of mitral valve stenosis. Tricuspid Valve: The tricuspid valve is normal in structure. Tricuspid valve regurgitation is mild . No evidence of tricuspid stenosis. Aortic Valve: The aortic valve is tricuspid. There is moderate calcification of the aortic valve. There is moderate thickening of the aortic valve. Aortic valve regurgitation is trivial. Moderate aortic stenosis is present. Aortic valve mean gradient measures 21.5 mmHg. Aortic valve peak gradient measures 39.7 mmHg. Aortic valve area, by VTI measures 1.24 cm. Pulmonic Valve: The pulmonic valve was normal in structure. Pulmonic valve regurgitation is not visualized. No evidence of pulmonic stenosis. Aorta: The aortic root is normal in size and structure. Venous: The inferior vena cava is dilated in size with greater than 50% respiratory variability, suggesting right atrial pressure of 8 mmHg. IAS/Shunts: No atrial level shunt detected by color flow Doppler.  LEFT VENTRICLE PLAX 2D LVOT diam:     2.00 cm LV SV:         79 LV SV Index:   39 LVOT Area:     3.14 cm  RIGHT VENTRICLE             IVC RV S prime:     15.40 cm/s  IVC diam: 2.20 cm TAPSE (M-mode): 2.0 cm LEFT ATRIUM           Index        RIGHT ATRIUM           Index LA Vol (A4C): 49.9 ml 24.78 ml/m  RA Area:     15.40 cm                                    RA Volume:   38.80 ml  19.27 ml/m  AORTIC VALVE AV Area (Vmax):    1.36 cm AV Area (Vmean):   1.30 cm AV Area (VTI):     1.24 cm AV Vmax:           315.00 cm/s AV Vmean:           213.000 cm/s AV VTI:            0.634 m AV Peak Grad:      39.7 mmHg AV Mean Grad:      21.5 mmHg LVOT Vmax:         136.67 cm/s LVOT Vmean:        88.433 cm/s LVOT VTI:          0.251 m LVOT/AV VTI ratio: 0.40  AORTA Ao Root diam: 3.30 cm Ao Asc diam:  3.00 cm MITRAL VALVE                TRICUSPID VALVE MV Area (PHT): 2.95 cm     TR Peak grad:   9.1 mmHg MV Decel Time: 257 msec     TR Vmax:        151.00 cm/s MV E velocity: 123.00 cm/s MV A velocity: 143.00 cm/s  SHUNTS MV E/A ratio:  0.86         Systemic VTI:  0.25 m  Systemic Diam: 2.00 cm Skeet Latch MD Electronically signed by Skeet Latch MD Signature Date/Time: 08/12/2022/11:09:49 AM    Final    US Abdomen Limited RUQ (LIVER/GB)  Result Date: 08/12/2022 CLINICAL DATA:  Bilirubinemia. EXAM: ULTRASOUND ABDOMEN LIMITED RIGHT UPPER QUADRANT COMPARISON:  September 08, 2020 FINDINGS: Gallbladder: The gallbladder is surgically absent. Common bile duct: Diameter: 6.52 mm Liver: No focal lesion identified. The liver parenchyma is coarse in echotexture and diffusely decreased in echogenicity. Portal vein is patent on color Doppler imaging with normal direction of blood flow towards the liver. Other: None. IMPRESSION: 1. Findings consistent with history of prior cholecystectomy. 2. Findings suggestive of hepatic cirrhosis without focal liver lesions. Electronically Signed   By: Virgina Norfolk M.D.   On: 08/12/2022 00:20   DG CHEST PORT 1 VIEW  Result Date: 08/11/2022 CLINICAL DATA:  Heart block EXAM: PORTABLE CHEST 1 VIEW COMPARISON:  X-ray 08/11/2022 and older.  CT 08/10/2022 FINDINGS: Overlapping cardiac leads and defibrillator pads. There is a right IJ line with tip extending to the right ventricle. Possible pacemaker lead. Enlarged cardiopericardial silhouette with calcified aorta. Vascular congestion. No pneumothorax or effusion. No consolidation. IMPRESSION: Presumed placement of a ventricular pacemaker lead  via the right IJ. No pneumothorax. Electronically Signed   By: Jill Side M.D.   On: 08/11/2022 21:15   CARDIAC CATHETERIZATION  Result Date: 08/11/2022 Images from the original result were not included. Temporary transvenous pacemaker implantation 08/11/2022: Procedure performed: Ultrasound-guided access of the right internal jugular vein and placement of a 6 French sheath, placement of a temporary transvenous pacemaker into the RV apex. Pacemaker; pacemaker is at 40 cm.  Capture was obtained at <0.5 mm.  Pacemaker set at 60 bpm at 10 mA, VVI mode. Adrian Prows, MD, [facility] 08/11/2022, 8:15 PM Office: [phone] Fax: [phone] Pager: [phone]   DG Chest Portable 1 View  Result Date: 08/11/2022 CLINICAL DATA:  Fall with chest compression EXAM: PORTABLE CHEST 1 VIEW COMPARISON:  Yesterday FINDINGS: Stable heart size and mediastinal contours. Mild interstitial coarsening at the bases. There is no edema, consolidation, effusion, or pneumothorax. IMPRESSION: No acute or interval finding. Electronically Signed   By: Jorje Guild M.D.   On: 08/11/2022 06:20   CT Head Wo Contrast  Result Date: 08/10/2022 CLINICAL DATA:  Fall, hit head on floor, hematoma to back of head EXAM: CT HEAD WITHOUT CONTRAST CT CERVICAL SPINE WITHOUT CONTRAST TECHNIQUE: Multidetector CT imaging of the head and cervical spine was performed following the standard protocol without intravenous contrast. Multiplanar CT image reconstructions of the cervical spine were also generated. RADIATION DOSE REDUCTION: This exam was performed according to the departmental dose-optimization program which includes automated exposure control, adjustment of the mA and/or kV according to patient size and/or use of iterative reconstruction technique. COMPARISON:  None Available. FINDINGS: CT HEAD FINDINGS Brain: No evidence of acute infarction, hemorrhage, hydrocephalus, extra-axial collection or mass lesion/mass effect. Periventricular and deep white  matter hypodensity. Vascular: No hyperdense vessel or unexpected calcification. Skull: Normal. Negative for fracture or focal lesion. Sinuses/Orbits: No acute finding. Other: Soft tissue contusion of the scalp vertex (series 4, image 8). CT CERVICAL SPINE FINDINGS Alignment: Normal. Skull base and vertebrae: No acute fracture. No primary bone lesion or focal pathologic process. Soft tissues and spinal canal: No prevertebral fluid or swelling. No visible canal hematoma. Disc levels: Focally moderate disc space height loss and osteophytosis of C5-C7 with otherwise preserved disc spaces. Upper chest: Negative. Other: None. IMPRESSION: 1. No acute intracranial pathology. Small-vessel  white matter disease. 2. Soft tissue contusion of the scalp vertex. 3. No fracture or static subluxation of the cervical spine. Electronically Signed   By: Delanna Ahmadi M.D.   On: 08/10/2022 19:20   CT Cervical Spine Wo Contrast  Result Date: 08/10/2022 CLINICAL DATA:  Fall, hit head on floor, hematoma to back of head EXAM: CT HEAD WITHOUT CONTRAST CT CERVICAL SPINE WITHOUT CONTRAST TECHNIQUE: Multidetector CT imaging of the head and cervical spine was performed following the standard protocol without intravenous contrast. Multiplanar CT image reconstructions of the cervical spine were also generated. RADIATION DOSE REDUCTION: This exam was performed according to the departmental dose-optimization program which includes automated exposure control, adjustment of the mA and/or kV according to patient size and/or use of iterative reconstruction technique. COMPARISON:  None Available. FINDINGS: CT HEAD FINDINGS Brain: No evidence of acute infarction, hemorrhage, hydrocephalus, extra-axial collection or mass lesion/mass effect. Periventricular and deep white matter hypodensity. Vascular: No hyperdense vessel or unexpected calcification. Skull: Normal. Negative for fracture or focal lesion. Sinuses/Orbits: No acute finding. Other: Soft  tissue contusion of the scalp vertex (series 4, image 8). CT CERVICAL SPINE FINDINGS Alignment: Normal. Skull base and vertebrae: No acute fracture. No primary bone lesion or focal pathologic process. Soft tissues and spinal canal: No prevertebral fluid or swelling. No visible canal hematoma. Disc levels: Focally moderate disc space height loss and osteophytosis of C5-C7 with otherwise preserved disc spaces. Upper chest: Negative. Other: None. IMPRESSION: 1. No acute intracranial pathology. Small-vessel white matter disease. 2. Soft tissue contusion of the scalp vertex. 3. No fracture or static subluxation of the cervical spine. Electronically Signed   By: Delanna Ahmadi M.D.   On: 08/10/2022 19:20   CT CHEST ABDOMEN PELVIS W CONTRAST  Result Date: 08/10/2022 CLINICAL DATA:  Fall, syncope, hit head, complains of thoracic back pain to palpation, history of prostate cancer * Tracking Code: BO * EXAM: CT CHEST, ABDOMEN, AND PELVIS WITH CONTRAST CT THORACIC AND LUMBAR SPINE WITH CONTRAST TECHNIQUE: Multidetector CT imaging of the chest, abdomen and pelvis was performed following the standard protocol during bolus administration of intravenous contrast. Multidetector CT imaging of the thoracic and lumbar spine was performed following the standard protocol during bolus administration of intravenous contrast. RADIATION DOSE REDUCTION: This exam was performed according to the departmental dose-optimization program which includes automated exposure control, adjustment of the mA and/or kV according to patient size and/or use of iterative reconstruction technique. CONTRAST:  50m OMNIPAQUE IOHEXOL 350 MG/ML SOLN COMPARISON:  CT abdomen pelvis, 10/18/2020 FINDINGS: CT CHEST FINDINGS Cardiovascular: Aortic atherosclerosis. Dense aortic valve calcifications. Normal heart size. Three-vessel coronary artery calcifications. Very dense mitral annulus calcifications. No pericardial effusion. Mediastinum/Nodes: No enlarged  mediastinal, hilar, or axillary lymph nodes. Small hiatal hernia. Thyroid gland, trachea, and esophagus demonstrate no significant findings. Lungs/Pleura: Mild paraseptal emphysema. Diffuse bilateral bronchial wall thickening. Dependent bibasilar scarring and or atelectasis. No pleural effusion or pneumothorax. Musculoskeletal: No chest wall mass or suspicious osseous lesions identified. CT ABDOMEN PELVIS FINDINGS Hepatobiliary: No solid liver abnormality is seen. Small benign left lobe liver cysts, for which no further follow-up or characterization is required. Coarse nodular cirrhotic morphology of the liver. Status post cholecystectomy. No biliary ductal dilatation. Pancreas: Unremarkable. No pancreatic ductal dilatation or surrounding inflammatory changes. Spleen: Splenomegaly, maximum coronal span 15.0 cm. Adrenals/Urinary Tract: Adrenal glands are unremarkable. Severely atrophic right kidney with a small nonobstructive calculus of the midportion. Left kidney is normal. No ureteral calculi or hydronephrosis. Bladder is unremarkable. Stomach/Bowel: Stomach  is within normal limits. Appendix appears normal. No evidence of bowel wall thickening, distention, or inflammatory changes. Descending and sigmoid diverticulosis. Vascular/Lymphatic: Aortic atherosclerosis. 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 No enlarged abdominal or pelvic lymph nodes. Reproductive: Status post prostatectomy. Other: Small fat containing bilateral inguinal hernias.  No ascites. Musculoskeletal: No acute osseous findings. CT THORACIC AND LUMBAR SPINE FINDINGS Alignment: Gentle dextroscoliosis of the thoracic spine, apex T6, with otherwise normal thoracic kyphosis. Normal lumbar lordosis. Vertebral bodies: Osteopenia. Probable very subtle superior endplate deformity of L1 with no significant anterior height loss (series 6, image 34). Unchanged, chronic Schmorl deformity of the superior endplate  of L3 (series 6, image 41). No fracture or dislocation. Disc spaces: Mild multilevel disc space height loss and osteophytosis throughout the thoracic and lumbar spine, with bridging osteophytosis of the mid to lower thoracic spine in keeping with DISH. Paraspinous soft tissues: Unremarkable. IMPRESSION: 1. No CT evidence of acute traumatic injury to the chest, abdomen, or pelvis. 2. Probable very subtle superior endplate deformity of L1 with no significant anterior height loss. Correlate for acute point tenderness. 3. Osteopenia. 4. Coronary artery disease. 5. Dense aortic valve calcifications. Correlate for echocardiographic evidence of aortic valve dysfunction. 6. Cirrhosis and splenomegaly. 7. Severely atrophic right kidney with a small nonobstructive calculus of the midportion. 8. Descending and sigmoid diverticulosis without evidence of acute diverticulitis. 9. Status post prostatectomy. No evidence of lymphadenopathy or metastatic disease in the abdomen or pelvis. 10. Aortic atherosclerosis. 11. Emphysema. Aortic Atherosclerosis (ICD10-I70.0) and Emphysema (ICD10-J43.9). Electronically Signed   By: Delanna Ahmadi M.D.   On: 08/10/2022 19:16   CT L-SPINE NO CHARGE  Result Date: 08/10/2022 CLINICAL DATA:  Fall, syncope, hit head, complains of thoracic back pain to palpation, history of prostate cancer * Tracking Code: BO * EXAM: CT CHEST, ABDOMEN, AND PELVIS WITH CONTRAST CT THORACIC AND LUMBAR SPINE WITH CONTRAST TECHNIQUE: Multidetector CT imaging of the chest, abdomen and pelvis was performed following the standard protocol during bolus administration of intravenous contrast. Multidetector CT imaging of the thoracic and lumbar spine was performed following the standard protocol during bolus administration of intravenous contrast. RADIATION DOSE REDUCTION: This exam was performed according to the departmental dose-optimization program which includes automated exposure control, adjustment of the mA and/or kV  according to patient size and/or use of iterative reconstruction technique. CONTRAST:  5m OMNIPAQUE IOHEXOL 350 MG/ML SOLN COMPARISON:  CT abdomen pelvis, 10/18/2020 FINDINGS: CT CHEST FINDINGS Cardiovascular: Aortic atherosclerosis. Dense aortic valve calcifications. Normal heart size. Three-vessel coronary artery calcifications. Very dense mitral annulus calcifications. No pericardial effusion. Mediastinum/Nodes: No enlarged mediastinal, hilar, or axillary lymph nodes. Small hiatal hernia. Thyroid gland, trachea, and esophagus demonstrate no significant findings. Lungs/Pleura: Mild paraseptal emphysema. Diffuse bilateral bronchial wall thickening. Dependent bibasilar scarring and or atelectasis. No pleural effusion or pneumothorax. Musculoskeletal: No chest wall mass or suspicious osseous lesions identified. CT ABDOMEN PELVIS FINDINGS Hepatobiliary: No solid liver abnormality is seen. Small benign left lobe liver cysts, for which no further follow-up or characterization is required. Coarse nodular cirrhotic morphology of the liver. Status post cholecystectomy. No biliary ductal dilatation. Pancreas: Unremarkable. No pancreatic ductal dilatation or surrounding inflammatory changes. Spleen: Splenomegaly, maximum coronal span 15.0 cm. Adrenals/Urinary Tract: Adrenal glands are unremarkable. Severely atrophic right kidney with a small nonobstructive calculus of the midportion. Left kidney is normal. No ureteral calculi or hydronephrosis. Bladder is unremarkable. Stomach/Bowel: Stomach is within normal limits. Appendix appears normal. No evidence of bowel wall thickening,  distention, or inflammatory changes. Descending and sigmoid diverticulosis. Vascular/Lymphatic: Aortic atherosclerosis. 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 No enlarged abdominal or pelvic lymph nodes. Reproductive: Status post prostatectomy. Other: Small fat containing bilateral inguinal hernias.  No  ascites. Musculoskeletal: No acute osseous findings. CT THORACIC AND LUMBAR SPINE FINDINGS Alignment: Gentle dextroscoliosis of the thoracic spine, apex T6, with otherwise normal thoracic kyphosis. Normal lumbar lordosis. Vertebral bodies: Osteopenia. Probable very subtle superior endplate deformity of L1 with no significant anterior height loss (series 6, image 34). Unchanged, chronic Schmorl deformity of the superior endplate of L3 (series 6, image 41). No fracture or dislocation. Disc spaces: Mild multilevel disc space height loss and osteophytosis throughout the thoracic and lumbar spine, with bridging osteophytosis of the mid to lower thoracic spine in keeping with DISH. Paraspinous soft tissues: Unremarkable. IMPRESSION: 1. No CT evidence of acute traumatic injury to the chest, abdomen, or pelvis. 2. Probable very subtle superior endplate deformity of L1 with no significant anterior height loss. Correlate for acute point tenderness. 3. Osteopenia. 4. Coronary artery disease. 5. Dense aortic valve calcifications. Correlate for echocardiographic evidence of aortic valve dysfunction. 6. Cirrhosis and splenomegaly. 7. Severely atrophic right kidney with a small nonobstructive calculus of the midportion. 8. Descending and sigmoid diverticulosis without evidence of acute diverticulitis. 9. Status post prostatectomy. No evidence of lymphadenopathy or metastatic disease in the abdomen or pelvis. 10. Aortic atherosclerosis. 11. Emphysema. Aortic Atherosclerosis (ICD10-I70.0) and Emphysema (ICD10-J43.9). Electronically Signed   By: Delanna Ahmadi M.D.   On: 08/10/2022 19:16   CT T-SPINE NO CHARGE  Result Date: 08/10/2022 CLINICAL DATA:  Fall, syncope, hit head, complains of thoracic back pain to palpation, history of prostate cancer * Tracking Code: BO * EXAM: CT CHEST, ABDOMEN, AND PELVIS WITH CONTRAST CT THORACIC AND LUMBAR SPINE WITH CONTRAST TECHNIQUE: Multidetector CT imaging of the chest, abdomen and pelvis was  performed following the standard protocol during bolus administration of intravenous contrast. Multidetector CT imaging of the thoracic and lumbar spine was performed following the standard protocol during bolus administration of intravenous contrast. RADIATION DOSE REDUCTION: This exam was performed according to the departmental dose-optimization program which includes automated exposure control, adjustment of the mA and/or kV according to patient size and/or use of iterative reconstruction technique. CONTRAST:  48m OMNIPAQUE IOHEXOL 350 MG/ML SOLN COMPARISON:  CT abdomen pelvis, 10/18/2020 FINDINGS: CT CHEST FINDINGS Cardiovascular: Aortic atherosclerosis. Dense aortic valve calcifications. Normal heart size. Three-vessel coronary artery calcifications. Very dense mitral annulus calcifications. No pericardial effusion. Mediastinum/Nodes: No enlarged mediastinal, hilar, or axillary lymph nodes. Small hiatal hernia. Thyroid gland, trachea, and esophagus demonstrate no significant findings. Lungs/Pleura: Mild paraseptal emphysema. Diffuse bilateral bronchial wall thickening. Dependent bibasilar scarring and or atelectasis. No pleural effusion or pneumothorax. Musculoskeletal: No chest wall mass or suspicious osseous lesions identified. CT ABDOMEN PELVIS FINDINGS Hepatobiliary: No solid liver abnormality is seen. Small benign left lobe liver cysts, for which no further follow-up or characterization is required. Coarse nodular cirrhotic morphology of the liver. Status post cholecystectomy. No biliary ductal dilatation. Pancreas: Unremarkable. No pancreatic ductal dilatation or surrounding inflammatory changes. Spleen: Splenomegaly, maximum coronal span 15.0 cm. Adrenals/Urinary Tract: Adrenal glands are unremarkable. Severely atrophic right kidney with a small nonobstructive calculus of the midportion. Left kidney is normal. No ureteral calculi or hydronephrosis. Bladder is unremarkable. Stomach/Bowel: Stomach is  within normal limits. Appendix appears normal. No evidence of bowel wall thickening, distention, or inflammatory changes. Descending and sigmoid diverticulosis. Vascular/Lymphatic: Aortic atherosclerosis. Chronic,  calcified dissection of the infrarenal abdominal aorta (series 3, image 87). No enlarged abdominal or pelvic lymph nodes. Reproductive: Status post prostatectomy. Other: Small fat containing bilateral inguinal hernias.  No ascites. Musculoskeletal: No acute osseous findings. CT THORACIC AND LUMBAR SPINE FINDINGS Alignment: Gentle dextroscoliosis of the thoracic spine, apex T6, with otherwise normal thoracic kyphosis. Normal lumbar lordosis. Vertebral bodies: Osteopenia. Probable very subtle superior endplate deformity of L1 with no significant anterior height loss (series 6, image 34). Unchanged, chronic Schmorl deformity of the superior endplate of L3 (series 6, image 41). No fracture or dislocation. Disc spaces: Mild multilevel disc space height loss and osteophytosis throughout the thoracic and lumbar spine, with bridging osteophytosis of the mid to lower thoracic spine in keeping with DISH. Paraspinous soft tissues: Unremarkable. IMPRESSION: 1. No CT evidence of acute traumatic injury to the chest, abdomen, or pelvis. 2. Probable very subtle superior endplate deformity of L1 with no significant anterior height loss. Correlate for acute point tenderness. 3. Osteopenia. 4. Coronary artery disease. 5. Dense aortic valve calcifications. Correlate for echocardiographic evidence of aortic valve dysfunction. 6. Cirrhosis and splenomegaly. 7. Severely atrophic right kidney with a small nonobstructive calculus of the midportion. 8. Descending and sigmoid diverticulosis without evidence of acute diverticulitis. 9. Status post prostatectomy. No evidence of lymphadenopathy or metastatic disease in the abdomen or pelvis. 10. Aortic atherosclerosis. 11. Emphysema. Aortic Atherosclerosis (ICD10-I70.0) and Emphysema  (ICD10-J43.9). Electronically Signed   By: Delanna Ahmadi M.D.   On: 08/10/2022 19:16   DG Pelvis Portable  Result Date: 08/10/2022 CLINICAL DATA:  Fall, right-sided hip pain EXAM: PORTABLE PELVIS 1-2 VIEWS COMPARISON:  None Available. FINDINGS: Osteopenia. There is no evidence of displaced pelvic fracture or diastasis. No pelvic bone lesions are seen. IMPRESSION: Osteopenia. No displaced fracture of the pelvis or bilateral proximal femurs seen in single frontal view. Please note that plain radiographs are significantly insensitive for hip and pelvic fracture. Recommend CT or MRI to more sensitively evaluate if there is high clinical suspicion for fracture. Electronically Signed   By: Delanna Ahmadi M.D.   On: 08/10/2022 17:11   DG Chest Portable 1 View  Result Date: 08/10/2022 CLINICAL DATA:  Back and right-sided chest pain following recent fall, initial encounter EXAM: PORTABLE CHEST 1 VIEW COMPARISON:  11/10/2020 FINDINGS: The heart size and mediastinal contours are within normal limits. Both lungs are clear. Bony structures show a vague lucency in the superior aspect of the scapula. An undisplaced fracture cannot be totally excluded given the patient's clinical history. IMPRESSION: No focal infiltrate noted. Changes suspicious for right scapular fracture. Correlate to point tenderness. Cross-sectional imaging may be helpful as clinically indicated. Electronically Signed   By: Inez Catalina M.D.   On: 08/10/2022 17:10    DISCHARGE EXAMINATION: Vitals:   08/17/22 1652 08/17/22 1956 08/18/22 0354 08/18/22 0515  BP: (!) 143/94 139/61 120/66   Pulse: 79 76 67   Resp: '20 20 17   '$ Temp: 98.9 F (37.2 C) 98.3 F (36.8 C) 97.9 F (36.6 C)   TempSrc: Oral Axillary Oral   SpO2: 96% 93% 96%   Weight:    89.4 kg  Height:       General appearance: Awake alert.  In no distress Resp: Clear to auscultation bilaterally.  Normal effort Cardio: S1-S2 is normal regular.  No S3-S4.  No rubs murmurs or  bruit GI: Abdomen is soft.  Nontender nondistended.  Bowel sounds are present normal.  No masses organomegaly   DISPOSITION: SNF versus inpatient rehabilitation  Discharge Instructions     Call MD for:  difficulty breathing, headache or visual disturbances   Complete by: As directed    Call MD for:  extreme fatigue   Complete by: As directed    Call MD for:  persistant dizziness or light-headedness   Complete by: As directed    Call MD for:  persistant nausea and vomiting   Complete by: As directed    Call MD for:  redness, tenderness, or signs of infection (pain, swelling, redness, odor or green/yellow discharge around incision site)   Complete by: As directed    Call MD for:  severe uncontrolled pain   Complete by: As directed    Call MD for:  temperature >100.4   Complete by: As directed    Diet - low sodium heart healthy   Complete by: As directed    Discharge instructions   Complete by: As directed    Please be sure to follow-up with your primary care provider early next week.  Cardiology will arrange outpatient follow-up for you.  Please take your medications as prescribed.  Follow instructions provided by the heart doctor.  You were cared for by a hospitalist during your hospital stay. If you have any questions about your discharge medications or the care you received while you were in the hospital after you are discharged, you can call the unit and asked to speak with the hospitalist on call if the hospitalist that took care of you is not available. Once you are discharged, your primary care physician will handle any further medical issues. Please note that NO REFILLS for any discharge medications will be authorized once you are discharged, as it is imperative that you return to your primary care physician (or establish a relationship with a primary care physician if you do not have one) for your aftercare needs so that they can reassess your need for medications and monitor your  lab values. If you do not have a primary care physician, you can call [phone] for a physician referral.   Increase activity slowly   Complete by: As directed          Allergies as of 08/18/2022       Reactions   Ciprofloxacin Other (See Comments)   Hallucinations or jitteriness   Codeine Other (See Comments)   Hallucinations   Flexeril [cyclobenzaprine] Other (See Comments)   "Made me feel goofy"   Imdur [isosorbide Nitrate] Other (See Comments)   Caused headaches   Methocarbamol Other (See Comments)   "Made me feel goofy"   Other Other (See Comments)   CANNOT HAVE ANY FOODS WITH SEEDS   Strawberry Extract Other (See Comments)   CANNOT HAVE ANY FOODS WITH SEEDS   Sulfa Antibiotics Other (See Comments)   Chills and shaking- "serum sickness"   Sulfamethoxazole Other (See Comments)   Chills and shaking- "serum sickness"   Sulfonamide Derivatives Other (See Comments)   Chills and shaking "serum sickness"   Tomato Other (See Comments)   CANNOT HAVE ANY FOODS WITH SEEDS   Flagyl [metronidazole] Rash        Medication List     TAKE these medications    aspirin EC 81 MG tablet Take 81 mg by mouth daily.   blood glucose meter kit and supplies Kit Dispense based on patient and insurance preference. Use up to four times daily as directed.   camphor-menthol lotion Commonly known as: SARNA Apply topically as needed for itching.   Farxiga 5 MG  Tabs tablet Generic drug: dapagliflozin propanediol TAKE 1 TABLET BY MOUTH EVERY DAY BEFORE BREAKFAST What changed: See the new instructions.   ipratropium 0.03 % nasal spray Commonly known as: ATROVENT Place 2 sprays into the nose 2 (two) times daily. What changed:  when to take this reasons to take this   lidocaine 5 % Commonly known as: LIDODERM Place 1 patch onto the skin daily. Remove & Discard patch within 12 hours or as directed by MD   melatonin 3 MG Tabs tablet Take 1 tablet (3 mg total) by mouth at bedtime.    nitroGLYCERIN 0.4 MG/SPRAY spray Commonly known as: NITROLINGUAL Place 1 spray under the tongue every 5 (five) minutes x 3 doses as needed for chest pain.   oxyCODONE 5 MG immediate release tablet Commonly known as: Oxy IR/ROXICODONE Take 1 tablet (5 mg total) by mouth every 4 (four) hours as needed for severe pain or breakthrough pain.   polyethylene glycol 17 g packet Commonly known as: MIRALAX / GLYCOLAX Take 17 g by mouth 2 (two) times daily.   senna-docusate 8.6-50 MG tablet Commonly known as: Senokot-S Take 2 tablets by mouth 2 (two) times daily.   thyroid 120 MG tablet Commonly known as: Armour Thyroid Take 1 tablet (120 mg total) by mouth daily.               Durable Medical Equipment  (From admission, onward)           Start     Ordered   08/14/22 1211  For home use only DME Bedside commode  Once       Question Answer Comment  Patient needs a bedside commode to treat with the following condition Pacemaker   Patient needs a bedside commode to treat with the following condition Hx of TIA (transient ischemic attack) and stroke      08/14/22 1211   08/14/22 0919  For home use only DME 4 wheeled rolling walker with seat  Once       Question:  Patient needs a walker to treat with the following condition  Answer:  Physical deconditioning   08/14/22 0919   08/14/22 0919  For home use only DME 3 n 1  Once        08/14/22 0919              Follow-up Information     Copland, Gay Filler, MD. Schedule an appointment as soon as possible for a visit in 1 week(s).   Specialty: Family Medicine Why: post hospitalization follow up Contact information: [address] [place] [address] [phone]         Constance Haw, MD Follow up.   Specialty: Cardiology Contact information: [place] [address] [phone]                 TOTAL DISCHARGE TIME: 35 minutes  [place]  [facility] Pager on [email or web address]  08/18/2022, 8:44 AM

## 2022-08-19 DIAGNOSIS — I441 Atrioventricular block, second degree: Secondary | ICD-10-CM | POA: Diagnosis not present

## 2022-08-19 LAB — GLUCOSE, CAPILLARY
Glucose-Capillary: 115 mg/dL — ABNORMAL HIGH (ref 70–99)
Glucose-Capillary: 173 mg/dL — ABNORMAL HIGH (ref 70–99)
Glucose-Capillary: 123 mg/dL — ABNORMAL HIGH (ref 70–99)
Glucose-Capillary: 140 mg/dL — ABNORMAL HIGH (ref 70–99)

## 2022-08-19 MED ORDER — HEPARIN SODIUM (PORCINE) 5000 UNIT/ML IJ SOLN
5000.0000 [IU] | Freq: Three times a day (TID) | INTRAMUSCULAR | Status: DC
  Administered 2022-08-19 – 2022-08-20 (×3): 5000 [IU] via SUBCUTANEOUS
  Filled 2022-08-19 (×3): qty 1

## 2022-08-19 NOTE — NC FL2 (Signed)
[place] LEVEL OF CARE FORM     IDENTIFICATION  Patient Name: [name] Birthdate: 1934/05/13 Sex: male Admission Date (Current Location): 08/10/2022  [facility] and [place] Number:  Herbalist and Address:  [facility] [place], [place] [address], [place], [place] [address]      Provider Number: [id number]  Attending Physician Name and Address:  Albertine Patricia, MD  Relative Name and Phone Number:  Joshua Soulier, wife - [phone]    Current Level of Care: Hospital Recommended Level of Care: [place] Prior Approval Number:    Date Approved/Denied:   PASRR Number: [id number]  Discharge Plan: SNF    Current Diagnoses: Patient Active Problem List   Diagnosis Date Noted   Second degree heart block 08/11/2022   Sinus arrest 08/11/2022   Cardiac syncope 08/11/2022   Syncope and collapse 08/10/2022   Uncontrolled type 2 diabetes mellitus with hyperglycemia, without long-term current use of insulin ([place]) 09/09/2020   Chest pain 09/08/2020   Acute diverticulitis 02/07/2019   Angina pectoris, variant ([place]) 05/19/2018   Poor compliance with CPAP treatment 04/20/2018   Excessive daytime sleepiness 03/22/2018   Poor sleep hygiene 03/22/2018   Ventral hernia without obstruction or gangrene 10/09/2017   TIA (transient ischemic attack)    Kidney stone    Hypertension    Hearing aid worn    Coronary artery disease involving native coronary artery of native heart    Asthma due to seasonal allergies    Arthritis    Erosive gastropathy 03/26/2017   Acute blood loss anemia 03/25/2017   Melena 03/24/2017   Chronic kidney disease, stage 3b ([place]) 03/24/2017   AF (paroxysmal atrial fibrillation) ([place]) 03/24/2017   Angina pectoris ([place]) 10/09/2015   Essential hypertension    Hyperlipemia    Carotid artery occlusion    Dizziness 08/09/2015   Gait instability 05/30/2015   Hypothyroidism, acquired 10/26/2013   OSA  (obstructive sleep apnea) 10/18/2013   Benign localized hyperplasia of prostate with urinary obstruction and other lower urinary tract symptoms (LUTS)(600.21) 12/21/2012   Carcinoma in situ of prostate 12/21/2012   Left shoulder pain 08/08/2012   Class 1 obesity due to excess calories with serious comorbidity and body mass index (BMI) of 34.0 to 34.9 in adult 07/24/2011   Generalized anxiety disorder 07/24/2011   Diverticulosis of colon 12/25/2009   DERMATITIS, SEBORRHEIC 12/25/2009   Noise-induced hearing loss 12/18/2009   Memory loss 08/14/2009   VENOUS INSUFFICIENCY, CHRONIC 09/09/2006   GERD without esophagitis 09/09/2006   HERNIA, HIATAL, NONCONGENITAL 09/09/2006   INSOMNIA NOS 09/09/2006    Orientation RESPIRATION BLADDER Height & Weight     Self, Time, Place  Normal External catheter Weight: 88.3 kg Height:  '5\' 6"'$  (167.6 cm)  BEHAVIORAL SYMPTOMS/MOOD NEUROLOGICAL BOWEL NUTRITION STATUS      Continent Diet (See discharge summary)  AMBULATORY STATUS COMMUNICATION OF NEEDS Skin   Limited Assist Verbally Surgical wounds (Left chest incision)                       Personal Care Assistance Level of Assistance  Bathing, Feeding, Dressing Bathing Assistance: Maximum assistance Feeding assistance: Limited assistance Dressing Assistance: Maximum assistance     Functional Limitations Info  Sight, Hearing, Speech Sight Info: Impaired Hearing Info: Impaired Speech Info: Adequate    SPECIAL CARE FACTORS FREQUENCY  PT (By licensed PT), OT (By licensed OT)     PT Frequency: 5 x per week  OT Frequency: 5 x per week            Contractures Contractures Info: Not present    Additional Factors Info  Code Status, Allergies, Insulin Sliding Scale Code Status Info: Full code Allergies Info: Cipro, codeine, flexaril, Imdur, Methocarbamol, strawberry, sulfa, tomato, flagyl   Insulin Sliding Scale Info: Moderate sliding scale 3 x per day with meals       Current  Medications (08/19/2022):  This is the current hospital active medication list Current Facility-Administered Medications  Medication Dose Route Frequency Provider Last Rate Last Admin   acetaminophen (TYLENOL) tablet 325-650 mg  325-650 mg Oral Q4H PRN Constance Haw, MD   650 mg at 08/13/22 0535   acetaminophen (TYLENOL) tablet 650 mg  650 mg Oral Q6H PRN Kayleen Memos, DO   650 mg at 08/18/22 1824   camphor-menthol (SARNA) lotion   Topical PRN Bonnielee Haff, MD       heparin injection 5,000 Units  5,000 Units Subcutaneous Q8H Elgergawy, Silver Huguenin, MD   5,000 Units at 08/19/22 1407   hydrALAZINE (APRESOLINE) injection 10 mg  10 mg Intravenous Q4H PRN Paliwal, Aditya, MD       insulin aspart (novoLOG) injection 0-15 Units  0-15 Units Subcutaneous TID WC Camnitz, Will Hassell Done, MD   3 Units at 08/19/22 1205   lidocaine (LIDODERM) 5 % 1 patch  1 patch Transdermal Q24H Bonnielee Haff, MD   1 patch at 08/18/22 1624   melatonin tablet 3 mg  3 mg Oral QHS Bonnielee Haff, MD   3 mg at 08/18/22 2107   Muscle Rub CREA   Topical PRN Kristopher Oppenheim, DO       ondansetron [facility]) injection 4 mg  4 mg Intravenous Q6H PRN Constance Haw, MD       Oral care mouth rinse  15 mL Mouth Rinse PRN Irene Pap N, DO       oxyCODONE (Oxy IR/ROXICODONE) immediate release tablet 5 mg  5 mg Oral Q4H PRN Little Ishikawa, MD   5 mg at 08/19/22 1407   polyethylene glycol (MIRALAX / GLYCOLAX) packet 17 g  17 g Oral BID Bonnielee Haff, MD   17 g at 08/19/22 3220   prochlorperazine (COMPAZINE) injection 5 mg  5 mg Intravenous Q6H PRN Irene Pap N, DO   5 mg at 08/11/22 1823   senna-docusate (Senokot-S) tablet 2 tablet  2 tablet Oral BID Bonnielee Haff, MD   2 tablet at 08/19/22 2542   thyroid (ARMOUR) tablet 120 mg  120 mg Oral Daily Irene Pap N, DO   120 mg at 08/19/22 7062     Discharge Medications: Please see discharge summary for a list of discharge medications.  Relevant Imaging  Results:  Relevant Lab Results:   Additional Information SS# [id number]  Curlene Labrum, RN

## 2022-08-19 NOTE — Progress Notes (Signed)
PROGRESS NOTE    [name]  BTD:[id number] DOB: [birth year] DOA: 08/10/2022 PCP: [name], MD   Brief Narrative:    [name] is a 89 y.o. male with medical history significant for chronic dizziness/balance issues for at least 2 years, coronary artery disease status post PCI with stenting, paroxysmal A-fib, previously on Xarelto and no longer on DOAC after GI bleeding episodes, prostate cancer status post radical prostatectomy in 1998, hypertension, hyperlipidemia with intolerance to statin, history of carotid artery disease status post left carotid endarterectomy in 1998, OSA on CPAP, bilateral sensorineural hearing dysfunction, and TIA, who presented to [facility] ED from home via EMS due to multiple syncopal episodes at home.  Patient indicates that he had no prodrome or symptoms other than noticing that the room had moved and that approximately halfway through his fall he notes loss of consciousness and awoke on the floor with his wife over him.  Loss of consciousness may be a few minutes, no postictal or confusion once aroused.  Given second fall where he struck his head on the kitchen floor the wife called EMS for transport to the hospital. Imaging unremarkable for fracture or bleed or acute findings.  Hospitalist called for admission.  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  This was followed by placement of permanent pacemaker on 1/31. Seems to be stable from cardiac standpoint.  Telemetry reviewed.  Telemetry can be discontinued.   Elevated troponin, suspect demand ischemia in the setting of syncope Minimally elevated.  Likely due to demand ischemia.  Patient denies any chest pain. Echocardiogram showed LVEF to be 65 to 70%.  No regional wall motion abnormalities were noted.  Moderate aortic valve stenosis was seen.  Acute encephalopathy Likely secondary to second-degree heart block.   Patient likely also has cognitive impairment at baseline.  Patient has a history of liver cirrhosis.  Ammonia level however was normal.   B12 level 3201, folate 8.1.  RPR is pending.  HIV is nonreactive. Further workup can be pursued in the outpatient setting.  Mentation is stable for the most part.   Prolonged Qtc Avoid QT prolonging medications.  Monitor electrolytes periodically.   Paroxysmal A-fib, previously on Xarelto  No longer on anticoagulation after history of GI bleeding episodes Not on rate control agents, rate is controlled   Elevated AST, bilirubin secondary to history of liver cirrhosis Ultrasound suggested liver cirrhosis.  Ammonia level noted to be normal.  Will need further evaluation and management and perhaps referral to gastroenterology as an outpatient.  Thrombocytopenia Secondary to liver cirrhosis.  No evidence for bleeding currently.  Macrocytic anemia Hemoglobin is stable.  Anemia panel does not show any clear deficiencies.  Macrocytosis is likely due to hypothyroidism.  Hypothyroidism TSH noted to be 12.47 with a free T4 of 0.82.  Currently on 120 mg of thyroid Armour.  Will recommend that TSH level be rechecked in 3 to 4 weeks before considering changes to his medication regimen.   CKD 3B Seems to be close to baseline.  Monitor urine output.  Avoid nephrotoxic agents.   Chronic diastolic CHF Euvolemic on exam.  Mild L1 vertebral compression fracture Incidentally noted on imaging studies.  No intervention at this time.  Conservative management with physical therapy and pain control.  Lidoderm patch has been prescribed.   DVT prophylaxis: SCDs, [place] heaprin Code Status: Full Family Communication: No family at bedside Disposition: CIR is recommended.  Status  is: Inpatient Remains inpatient appropriate because: Second-degree heart block   Consultants:  Cardiology, PCCM  Procedures:  PPM insertion  Antimicrobials:  None  indicated  Subjective:  No significant events overnight, he reports back pain yesterday  Objective: Vitals:   08/18/22 1823 08/19/22 0441 08/19/22 0500 08/19/22 0838  BP: 127/63 (!) 151/72  (!) 174/80  Pulse: 83 72  79  Resp: '18 16  18  '$ Temp: 97.8 F (36.6 C) 97.7 F (36.5 C)  97.9 F (36.6 C)  TempSrc: Oral Oral  Oral  SpO2: 94% 95%  97%  Weight:   88.3 kg   Height:        Intake/Output Summary (Last 24 hours) at 08/19/2022 1102 Last data filed at 08/19/2022 0840 Gross per 24 hour  Intake --  Output 200 ml  Net -200 ml   Filed Weights   08/17/22 0308 08/18/22 0515 08/19/22 0500  Weight: 93 kg 89.4 kg 88.3 kg    Examination:  Awake Alert, extremely frail, no apparent distress Symmetrical Chest wall movement, Good air movement bilaterally, CTAB RRR,No Gallops,Rubs or new Murmurs, No Parasternal Heave, has bruising in the chest area from recent pacemaker insertion +ve B.Sounds, Abd Soft, No tenderness, No rebound - guarding or rigidity. No Cyanosis, Clubbing or edema, No new Rash or bruise    Data Reviewed: I have personally reviewed following labs and imaging studies  CBC: Recent Labs  Lab 08/13/22 0243 08/14/22 0743 08/15/22 0432  WBC 8.3 8.2 7.8  HGB 13.1 12.1* 12.3*  HCT 37.0* 35.7* 33.5*  MCV 102.5* 103.5* 100.6*  PLT 98* 95* 409*   Basic Metabolic Panel: Recent Labs  Lab 08/13/22 0243 08/14/22 0743 08/15/22 0432 08/17/22 0608  NA 137 136 134* 133*  K 3.8 4.0 3.9 4.0  CL 106 105 104 101  CO2 21* 21* 23 25  GLUCOSE 129* 119* 116* 112*  BUN 22 27* 28* 22  CREATININE 1.59* 1.47* 1.36* 1.38*  CALCIUM 8.7* 8.7* 8.6* 8.5*  MG 1.9  --   --   --    GFR: Estimated Creatinine Clearance: 39.3 mL/min (A) (by C-G formula based on SCr of 1.38 mg/dL (H)). Liver Function Tests: Recent Labs  Lab 08/13/22 0243 08/14/22 0743 08/15/22 0432  AST 34 36 34  ALT '20 15 16  '$ ALKPHOS 74 81 78  BILITOT 3.1* 3.0* 2.5*  PROT 6.4* 6.1* 6.2*  ALBUMIN 3.1* 3.0* 2.9*     CBG: Recent Labs  Lab 08/18/22 1636 08/18/22 1849 08/18/22 2026 08/18/22 2205 08/19/22 0843  GLUCAP 104* 131* 177* 129* 115*     Recent Results (from the past 240 hour(s))  Resp panel by RT-PCR (RSV, Flu A&B, Covid) Anterior Nasal Swab     Status: None   Collection Time: 08/10/22  5:00 PM   Specimen: Anterior Nasal Swab  Result Value Ref Range Status   SARS Coronavirus 2 by RT PCR NEGATIVE NEGATIVE Final    Comment: (NOTE) SARS-CoV-2 target nucleic acids are NOT DETECTED.  The SARS-CoV-2 RNA is generally detectable in upper respiratory specimens during the acute phase of infection. The lowest concentration of SARS-CoV-2 viral copies this assay can detect is 138 copies/mL. A negative result does not preclude SARS-Cov-2 infection and should not be used as the sole basis for treatment or other patient management decisions. A negative result may occur with  improper specimen collection/handling, submission of specimen other than nasopharyngeal swab, presence of viral mutation(s) within the areas targeted by this assay, and inadequate number of viral  copies(<138 copies/mL). A negative result must be combined with clinical observations, patient history, and epidemiological information. The expected result is Negative.  Fact Sheet for Patients:  EntrepreneurPulse.com.au  Fact Sheet for Healthcare Providers:  IncredibleEmployment.be  This test is no t yet approved or cleared by the [place] FDA and  has been authorized for detection and/or diagnosis of SARS-CoV-2 by FDA under an Emergency Use Authorization (EUA). This EUA will remain  in effect (meaning this test can be used) for the duration of the COVID-19 declaration under Section 564(b)(1) of the Act, 21 U.S.C.section 360bbb-3(b)(1), unless the authorization is terminated  or revoked sooner.       Influenza A by PCR NEGATIVE NEGATIVE Final   Influenza B by PCR NEGATIVE  NEGATIVE Final    Comment: (NOTE) The Xpert Xpress SARS-CoV-2/FLU/RSV plus assay is intended as an aid in the diagnosis of influenza from Nasopharyngeal swab specimens and should not be used as a sole basis for treatment. Nasal washings and aspirates are unacceptable for Xpert Xpress SARS-CoV-2/FLU/RSV testing.  Fact Sheet for Patients: EntrepreneurPulse.com.au  Fact Sheet for Healthcare Providers: IncredibleEmployment.be  This test is not yet approved or cleared by the [place] FDA and has been authorized for detection and/or diagnosis of SARS-CoV-2 by FDA under an Emergency Use Authorization (EUA). This EUA will remain in effect (meaning this test can be used) for the duration of the COVID-19 declaration under Section 564(b)(1) of the Act, 21 U.S.C. section 360bbb-3(b)(1), unless the authorization is terminated or revoked.     Resp Syncytial Virus by PCR NEGATIVE NEGATIVE Final    Comment: (NOTE) Fact Sheet for Patients: EntrepreneurPulse.com.au  Fact Sheet for Healthcare Providers: IncredibleEmployment.be  This test is not yet approved or cleared by the [place] FDA and has been authorized for detection and/or diagnosis of SARS-CoV-2 by FDA under an Emergency Use Authorization (EUA). This EUA will remain in effect (meaning this test can be used) for the duration of the COVID-19 declaration under Section 564(b)(1) of the Act, 21 U.S.C. section 360bbb-3(b)(1), unless the authorization is terminated or revoked.  Performed at [name], [place] [address], [place], [place] [address]   MRSA Next Gen by PCR, Nasal     Status: None   Collection Time: 08/11/22  8:26 PM   Specimen: Nasal Mucosa; Nasal Swab  Result Value Ref Range Status   MRSA by PCR Next Gen NOT DETECTED NOT DETECTED Final    Comment: (NOTE) The GeneXpert MRSA Assay (FDA approved for NASAL specimens only), is one component  of a comprehensive MRSA colonization surveillance program. It is not intended to diagnose MRSA infection nor to guide or monitor treatment for MRSA infections. Test performance is not FDA approved in patients less than 24 years old. Performed at [facility], [place] [address] [place], [place] [address]   Surgical PCR screen     Status: None   Collection Time: 08/12/22  1:04 PM   Specimen: Nasal Mucosa; Nasal Swab  Result Value Ref Range Status   MRSA, PCR NEGATIVE NEGATIVE Final   Staphylococcus aureus NEGATIVE NEGATIVE Final    Comment: (NOTE) The Xpert SA Assay (FDA approved for NASAL specimens in patients 68 years of age and older), is one component of a comprehensive surveillance program. It is not intended to diagnose infection nor to guide or monitor treatment. Performed at [facility], [place] [address], [place], [place] [address]          Radiology Studies: No  results found.   Scheduled Meds:  insulin aspart  0-15 Units Subcutaneous TID WC   lidocaine  1 patch Transdermal Q24H   melatonin  3 mg Oral QHS   polyethylene glycol  17 g Oral BID   senna-docusate  2 tablet Oral BID   thyroid  120 mg Oral Daily   Continuous Infusions:     LOS: 8 days    Marilyne Haseley [facility]  If 7PM-7AM, please contact night-coverage [email or web address]  08/19/2022, 11:02 AM

## 2022-08-19 NOTE — Progress Notes (Signed)
Physical Therapy Treatment Patient Details Name: [name] MRN: [id number] DOB: 1933/12/19 Today's Date: 08/19/2022   History of Present Illness [name] is an 89 y.o. male who presented 08/10/22 to the emergency department after 2 syncope episodes in 24 hours. CT head without acute intracranial findings. 2x ventricular standstill/pauses noted since admission 20-30 seconds. Temp pacer placed 1/30. S/P PPM 1/31. Imaging 2/1 revealed: "acute vertebral compression fracture at the thoracolumbar  junction, new from the prior thoracic and lumbar spine CT of  08/10/2022". PMH: TIA, hypertension, hyperlipidemia, caroitd artery occlusion, CAD, prostate CA, tinnitus    PT Comments    Pt seen for PT tx with pt's wife present throughout session. Pt is able to complete STS transfers with min assist from various surfaces with ongoing cuing re: safe hand placement & technique during STS. Pt ambulates into hallway x 2 trials with RW & min assist. Pt requires max cuing re: proper use of AD & need to ambulate within base of RW. Pt with very poor awareness (intellectual, safety, overall), and is also limited by [facility] despite use of hearing aides. Continue to recommend rehab in post acute setting.  Addendum: If pt does not qualify for CIR, recommend SNF for post acute rehab.    Recommendations for follow up therapy are one component of a multi-disciplinary discharge planning process, led by the attending physician.  Recommendations may be updated based on patient status, additional functional criteria and insurance authorization.  Follow Up Recommendations  Acute inpatient rehab (3hours/day); Addendum: If pt does not qualify for CIR, recommend SNF for post acute rehab.     Assistance Recommended at Discharge Frequent or constant Supervision/Assistance  Patient can return home with the following A lot of help with walking and/or transfers;A lot of help with bathing/dressing/bathroom;Assistance with  cooking/housework;Direct supervision/assist for financial management;Direct supervision/assist for medications management;Assist for transportation;Help with stairs or ramp for entrance   Equipment Recommendations  BSC/3in1;Rolling walker (2 wheels)    Recommendations for Other Services       Precautions / Restrictions Precautions Precautions: Fall;Back;ICD/Pacemaker Restrictions Weight Bearing Restrictions: No Other Position/Activity Restrictions: pacemaker precautions L UE     Mobility  Bed Mobility               General bed mobility comments: Not tested, pt received sitting EOB, left sitting in recliner.    Transfers Overall transfer level: Needs assistance Equipment used: Rolling walker (2 wheels) Transfers: Sit to/from Stand Sit to Stand: Min assist           General transfer comment: STS from EOB & recliner, cuing for safe hand placement to push to standing vs pulling to stand with BUE on RW.    Ambulation/Gait Ambulation/Gait assistance: Min assist Gait Distance (Feet): 55 Feet Assistive device: Rolling walker (2 wheels) Gait Pattern/deviations: Trunk flexed, Decreased stride length, Shuffle, Decreased step length - right, Decreased step length - left, Decreased dorsiflexion - right, Decreased dorsiflexion - left Gait velocity: decreased     General Gait Details: Pt demonstrates decreased step length BLE, decreased stride length, decreased dorsiflexion & heel strike BLE. Pt requires max education/cuing to ambulate within base of AD but when he does he reports he feels as if he's going to fall forwards over RW. Pt requires max cuing/assist to ambulate within base of AD when turning. Decreased ability to maneuver RW around obstacles in room.   Stairs             Wheelchair Mobility    Modified  Rankin (Stroke Patients Only)       Balance Overall balance assessment: Needs assistance Sitting-balance support: Feet supported Sitting balance-Leahy  Scale: Fair     Standing balance support: Bilateral upper extremity supported, During functional activity, Reliant on assistive device for balance Standing balance-Leahy Scale: Poor                              Cognition   Behavior During Therapy: WFL for tasks assessed/performed Overall Cognitive Status: Impaired/Different from baseline Area of Impairment: Attention, Memory, Following commands, Safety/judgement, Awareness, Problem solving, Orientation                 Orientation Level: Disoriented to, Place, Time, Situation Current Attention Level: Sustained Memory: Decreased short-term memory, Decreased recall of precautions Following Commands: Follows one step commands inconsistently, Follows one step commands with increased time Safety/Judgement: Decreased awareness of safety, Decreased awareness of deficits Awareness: Intellectual Problem Solving: Slow processing, Decreased initiation, Difficulty sequencing, Requires verbal cues, Requires tactile cues General Comments: Pt oriented to self only, requires extra time & pt demonstrates poor ability to follow commands at times. Decreased intellectual awareness, decreased safety awareness.        Exercises      General Comments        Pertinent Vitals/Pain Pain Assessment Pain Assessment: Faces Faces Pain Scale: Hurts even more Pain Location: back during 1 STS attempt Pain Descriptors / Indicators: Grimacing, Guarding, Discomfort, Moaning Pain Intervention(s): Repositioned, Monitored during session    Home Living                          Prior Function            PT Goals (current goals can now be found in the care plan section) Acute Rehab PT Goals Patient Stated Goal: to reduce back pain PT Goal Formulation: With patient/family Time For Goal Achievement: 08/27/22 Potential to Achieve Goals: Good Progress towards PT goals: Progressing toward goals    Frequency    Min  3X/week      PT Plan Current plan remains appropriate;Equipment recommendations need to be updated    Co-evaluation              AM-PAC PT "6 Clicks" Mobility   Outcome Measure  Help needed turning from your back to your side while in a flat bed without using bedrails?: A Lot Help needed moving from lying on your back to sitting on the side of a flat bed without using bedrails?: A Lot Help needed moving to and from a bed to a chair (including a wheelchair)?: A Little Help needed standing up from a chair using your arms (e.g., wheelchair or bedside chair)?: A Little Help needed to walk in hospital room?: A Little Help needed climbing 3-5 steps with a railing? : A Lot 6 Click Score: 15    End of Session Equipment Utilized During Treatment: Gait belt Activity Tolerance: Patient tolerated treatment well Patient left: with chair alarm set;with call bell/phone within reach;with family/visitor present;in chair Nurse Communication:  (pt request for pain meds, something for itching rash on back, & difficulty chewing dry meats on meal tray) PT Visit Diagnosis: Other abnormalities of gait and mobility (R26.89);History of falling (Z91.81);Difficulty in walking, not elsewhere classified (R26.2);Muscle weakness (generalized) (M62.81)     Time: 6720-9470 PT Time Calculation (min) (ACUTE ONLY): 31 min  Charges:  $Therapeutic Activity: 23-37 mins  Lavone Nian, PT, DPT 08/19/22, 8:20 AM   Waunita Schooner 08/19/2022, 2:01 PM

## 2022-08-19 NOTE — Progress Notes (Shared)
Inpatient Rehab Admissions Coordinator:    I continue to await a decision on insurance appeal for CIR. I will follow for potential admit pending insurance authorization.   I spoke with Pt.'s daughter Lenna Sciara over the phone. She states that she is the best point of contact for Pt. And that her mother (Pt.'s wife Deloris) is very overwhelmed and not able to answer most questions. She also states Deloris is likely to need a cardioversion later this week. I reviewed need for 24/7 support at discharge with [facility] and she is in agreement, stating she and her husband will be covering some days. I also spoke with Pt.'s son Wille Glaser who affirmed he could assist for several days out of the week and Trish Mage, Pt.'s granddaughter's wife who is a Psychologist, sport and exercise, who stated that she and her husband plan to come up for weeks at a time to assist in Pt.'s care if needed upon discharge.   Clemens Catholic, [place], [place] Admissions Coordinator  [phone] ([place]) [phone] (office)

## 2022-08-19 NOTE — TOC Progression Note (Signed)
Transition of Care [facility]) - Progression Note    Patient Details  Name: [name] MRN: [id number] Date of Birth: June 02, 1934  Transition of Care [facility]) CM/SW [place], RN Phone Number: 08/19/2022, 3:36 PM  Clinical Narrative:    CM spoke with Clemens Catholic, CM with CIR and insurance has declined patient for CIR placement.  The patient lives at home with the wife and is unable to mobilize safely and will need SNF placement.  The patient's wife and daughter are in agreement.  The patient was updated.  SNF workup will be completed and I will discuss bed offers in the morning with the daughter.  INsurance authorization will need to be started tomorrow for likely admission to the facility in the next 1-2 days.  PTAR transport will be needed.   Expected Discharge Plan: [place] Barriers to Discharge: Continued Medical Work up  Expected Discharge Plan and Services   Discharge Planning Services: CM Consult Post Acute Care Choice: [place] Living arrangements for the past 2 months: Single Family Home Expected Discharge Date: 08/14/22               DME Arranged: Gilford Rile rolling with seat, 3-N-1 DME Agency: [employer]       HH Arranged: PT, OT           Social Determinants of Health (SDOH) Interventions SDOH Screenings   Food Insecurity: No Food Insecurity (08/11/2022)  Housing: Low Risk  (08/11/2022)  Transportation Needs: No Transportation Needs (08/11/2022)  Utilities: Not At Risk (08/11/2022)  Alcohol Screen: Low Risk  (01/12/2022)  Depression (PHQ2-9): Low Risk  (01/12/2022)  Financial Resource Strain: Low Risk  (09/18/2019)  Physical Activity: Inactive (01/12/2022)  Social Connections: Moderately Isolated (01/12/2022)  Stress: No Stress Concern Present (01/12/2022)  Tobacco Use: Medium Risk (08/12/2022)    Readmission Risk Interventions    08/19/2022    3:35 PM  Readmission Risk Prevention Plan  Transportation Screening Complete   PCP or Specialist Appt within 5-7 Days Complete  Home Care Screening Complete  Medication Review (RN CM) Complete

## 2022-08-19 NOTE — Plan of Care (Signed)
  Problem: Education: Goal: Knowledge of General Education information will improve Description: Including pain rating scale, medication(s)/side effects and non-pharmacologic comfort measures Outcome: Progressing   Problem: Health Behavior/Discharge Planning: Goal: Ability to manage health-related needs will improve Outcome: Progressing   Problem: Clinical Measurements: Goal: Ability to maintain clinical measurements within normal limits will improve Outcome: Progressing Goal: Will remain free from infection Outcome: Progressing Goal: Diagnostic test results will improve Outcome: Progressing Goal: Respiratory complications will improve Outcome: Progressing Goal: Cardiovascular complication will be avoided Outcome: Progressing   Problem: Activity: Goal: Risk for activity intolerance will decrease Outcome: Progressing   Problem: Nutrition: Goal: Adequate nutrition will be maintained Outcome: Progressing   Problem: Coping: Goal: Level of anxiety will decrease Outcome: Progressing   Problem: Elimination: Goal: Will not experience complications related to bowel motility Outcome: Progressing Goal: Will not experience complications related to urinary retention Outcome: Progressing   Problem: Pain Managment: Goal: General experience of comfort will improve Outcome: Progressing   Problem: Safety: Goal: Ability to remain free from injury will improve Outcome: Progressing   Problem: Skin Integrity: Goal: Risk for impaired skin integrity will decrease Outcome: Progressing   Problem: Education: Goal: Knowledge of cardiac device and self-care will improve Outcome: Progressing Goal: Ability to safely manage health related needs after discharge will improve Outcome: Progressing Goal: Individualized Educational Video(s) Outcome: Progressing   Problem: Cardiac: Goal: Ability to achieve and maintain adequate cardiopulmonary perfusion will improve Outcome: Progressing    Problem: Education: Goal: Ability to describe self-care measures that may prevent or decrease complications (Diabetes Survival Skills Education) will improve Outcome: Progressing Goal: Individualized Educational Video(s) Outcome: Progressing   Problem: Coping: Goal: Ability to adjust to condition or change in health will improve Outcome: Progressing   Problem: Fluid Volume: Goal: Ability to maintain a balanced intake and output will improve Outcome: Progressing   Problem: Health Behavior/Discharge Planning: Goal: Ability to identify and utilize available resources and services will improve Outcome: Progressing Goal: Ability to manage health-related needs will improve Outcome: Progressing   Problem: Metabolic: Goal: Ability to maintain appropriate glucose levels will improve Outcome: Progressing   Problem: Nutritional: Goal: Maintenance of adequate nutrition will improve Outcome: Progressing Goal: Progress toward achieving an optimal weight will improve Outcome: Progressing   Problem: Skin Integrity: Goal: Risk for impaired skin integrity will decrease Outcome: Progressing   Problem: Tissue Perfusion: Goal: Adequacy of tissue perfusion will improve Outcome: Progressing   Problem: Safety: Goal: Non-violent Restraint(s) Outcome: Progressing

## 2022-08-19 NOTE — Progress Notes (Signed)
Inpatient Rehab Admissions Coordinator:    Insurance denied expedited appeal for CIR. Pt.'s daughter notified. TOC notified. CIR will sign off.   Clemens Catholic, [place], [place] Admissions Coordinator  [phone] ([place]) [phone] (office)

## 2022-08-20 LAB — GLUCOSE, CAPILLARY
Glucose-Capillary: 119 mg/dL — ABNORMAL HIGH (ref 70–99)
Glucose-Capillary: 154 mg/dL — ABNORMAL HIGH (ref 70–99)

## 2022-08-20 MED ORDER — OXYCODONE HCL 5 MG PO TABS: 5.0000 mg | ORAL_TABLET | ORAL | 0 refills | Status: DC | PRN

## 2022-08-20 NOTE — Discharge Summary (Signed)
[facility]  Physician Discharge Summary   Patient ID: [name] MRN: [id number] DOB/AGE: Nov 28, 1933 89 y.o.  Admit date: 08/10/2022 Discharge date:   08/20/2022   PCP: [name], MD  DISCHARGE DIAGNOSES:    Second degree heart block   Uncontrolled type 2 diabetes mellitus with hyperglycemia, without long-term current use of insulin (HCC)   GERD without esophagitis   OSA (obstructive sleep apnea)   Hypothyroidism, acquired   Essential hypertension   Hyperlipemia   Chronic kidney disease, stage 3b (HCC)   AF (paroxysmal atrial fibrillation) ([place])   Coronary artery disease involving native coronary artery of native heart   Cardiac syncope   RECOMMENDATIONS FOR OUTPATIENT FOLLOW UP: Patient has an appointment on 2/14 at 8:40 AM for pacemaker wound check at the cardiovascular disease office on [facility] Please check CBC and basic metabolic panel in 3 to 4 days Please check TSH and free T4 in 3 to 4 weeks   Home Health: SNF  Equipment/Devices: None  CODE STATUS: Full code  DISCHARGE CONDITION: fair  Diet recommendation: Heart healthy  INITIAL HISTORY: 89 y.o. male with medical history significant for chronic dizziness/balance issues for at least 2 years, coronary artery disease status post PCI with stenting, paroxysmal A-fib, previously on Xarelto and no longer on DOAC after GI bleeding episodes, prostate cancer status post radical prostatectomy in 1998, hypertension, hyperlipidemia with intolerance to statin, history of carotid artery disease status post left carotid endarterectomy in 1998, OSA on CPAP, bilateral sensorineural hearing dysfunction, and TIA, who presented to [facility] ED from home via EMS due to multiple syncopal episodes at home.  Patient indicates that he had no prodrome or symptoms other than noticing that the room had moved and that approximately halfway through his fall he notes loss of consciousness and awoke on the floor with his wife over  him.  Loss of consciousness may be a few minutes, no postictal or confusion once aroused.  Given second fall where he struck his head on the kitchen floor the wife called EMS for transport to the hospital. Imaging unremarkable for fracture or bleed or acute findings.  Hospitalist called for admission.   Consultations: Cardiology  Procedures: Pacemaker placement   HOSPITAL COURSE:   Syncope secondary to second-degree heart block  Patient underwent temporary pacemaker placement initially.  This was followed by placement of permanent pacemaker on 1/31. Seems to be stable from cardiac standpoint.  Cardiology to arrange outpatient follow-up.   Elevated troponin, suspect demand ischemia in the setting of syncope Minimally elevated.  Likely due to demand ischemia.  Patient denies any chest pain. Echocardiogram showed LVEF to be 65 to 70%.  No regional wall motion abnormalities were noted.  Moderate aortic valve stenosis was seen.   Acute encephalopathy Likely secondary to second-degree heart block.  Patient likely also has cognitive impairment at baseline.  Patient has a history of liver cirrhosis.  Ammonia level however was normal.   B12 level 3201, folate 8.1.  RPR is nonreactive.  HIV is nonreactive. Further workup can be pursued in the outpatient setting.  Mentation is stable for the most part.   Prolonged Qtc Avoid QT prolonging medications.  Monitor electrolytes periodically.   Paroxysmal A-fib, previously on Xarelto  No longer on anticoagulation after history of GI bleeding episodes Not on rate control agents, rate is controlled   Elevated AST, bilirubin secondary to history of liver cirrhosis Ultrasound suggested liver cirrhosis.  Ammonia level noted to be normal.  Will need further  evaluation and management and perhaps referral to gastroenterology as an outpatient.   Thrombocytopenia Secondary to liver cirrhosis.  No evidence for bleeding currently.   Macrocytic  anemia Hemoglobin is stable.  Anemia panel does not show any clear deficiencies.  Macrocytosis is likely due to hypothyroidism.   Hypothyroidism TSH noted to be 12.47 with a free T4 of 0.82.  Currently on 120 mg of thyroid Armour.  Will recommend that TSH level be rechecked in 3 to 4 weeks before considering changes to his medication regimen.   CKD 3B Seems to be close to baseline.  Monitor urine output.  Avoid nephrotoxic agents.   Chronic diastolic CHF Euvolemic on exam.   Mild L1 vertebral compression fracture Incidentally noted on imaging studies.  No intervention at this time.  Conservative management with physical therapy and pain control.  Lidoderm patch has been prescribed.  Obesity Estimated body mass index is 33.38 kg/m as calculated from the following:   Height as of this encounter: '5\' 6"'$  (1.676 m).   Weight as of this encounter: 93.8 kg.  Patient is stable.  Okay for discharge to SNF    PERTINENT LABS:  The results of significant diagnostics from this hospitalization (including imaging, microbiology, ancillary and laboratory) are listed below for reference.    Microbiology: Recent Results (from the past 240 hour(s))  Resp panel by RT-PCR (RSV, Flu A&B, Covid) Anterior Nasal Swab     Status: None   Collection Time: 08/10/22  5:00 PM   Specimen: Anterior Nasal Swab  Result Value Ref Range Status   SARS Coronavirus 2 by RT PCR NEGATIVE NEGATIVE Final    Comment: (NOTE) SARS-CoV-2 target nucleic acids are NOT DETECTED.  The SARS-CoV-2 RNA is generally detectable in upper respiratory specimens during the acute phase of infection. The lowest concentration of SARS-CoV-2 viral copies this assay can detect is 138 copies/mL. A negative result does not preclude SARS-Cov-2 infection and should not be used as the sole basis for treatment or other patient management decisions. A negative result may occur with  improper specimen collection/handling, submission of specimen  other than nasopharyngeal swab, presence of viral mutation(s) within the areas targeted by this assay, and inadequate number of viral copies(<138 copies/mL). A negative result must be combined with clinical observations, patient history, and epidemiological information. The expected result is Negative.  Fact Sheet for Patients:  EntrepreneurPulse.com.au  Fact Sheet for Healthcare Providers:  IncredibleEmployment.be  This test is no t yet approved or cleared by the [place] FDA and  has been authorized for detection and/or diagnosis of SARS-CoV-2 by FDA under an Emergency Use Authorization (EUA). This EUA will remain  in effect (meaning this test can be used) for the duration of the COVID-19 declaration under Section 564(b)(1) of the Act, 21 U.S.C.section 360bbb-3(b)(1), unless the authorization is terminated  or revoked sooner.       Influenza A by PCR NEGATIVE NEGATIVE Final   Influenza B by PCR NEGATIVE NEGATIVE Final    Comment: (NOTE) The Xpert Xpress SARS-CoV-2/FLU/RSV plus assay is intended as an aid in the diagnosis of influenza from Nasopharyngeal swab specimens and should not be used as a sole basis for treatment. Nasal washings and aspirates are unacceptable for Xpert Xpress SARS-CoV-2/FLU/RSV testing.  Fact Sheet for Patients: EntrepreneurPulse.com.au  Fact Sheet for Healthcare Providers: IncredibleEmployment.be  This test is not yet approved or cleared by the [place] FDA and has been authorized for detection and/or diagnosis of SARS-CoV-2 by FDA under an Emergency Use Authorization (  EUA). This EUA will remain in effect (meaning this test can be used) for the duration of the COVID-19 declaration under Section 564(b)(1) of the Act, 21 U.S.C. section 360bbb-3(b)(1), unless the authorization is terminated or revoked.     Resp Syncytial Virus by PCR NEGATIVE NEGATIVE Final     Comment: (NOTE) Fact Sheet for Patients: EntrepreneurPulse.com.au  Fact Sheet for Healthcare Providers: IncredibleEmployment.be  This test is not yet approved or cleared by the [place] FDA and has been authorized for detection and/or diagnosis of SARS-CoV-2 by FDA under an Emergency Use Authorization (EUA). This EUA will remain in effect (meaning this test can be used) for the duration of the COVID-19 declaration under Section 564(b)(1) of the Act, 21 U.S.C. section 360bbb-3(b)(1), unless the authorization is terminated or revoked.  Performed at [facility], [place] [address] [place], [place] [address]   MRSA Next Gen by PCR, Nasal     Status: None   Collection Time: 08/11/22  8:26 PM   Specimen: Nasal Mucosa; Nasal Swab  Result Value Ref Range Status   MRSA by PCR Next Gen NOT DETECTED NOT DETECTED Final    Comment: (NOTE) The GeneXpert MRSA Assay (FDA approved for NASAL specimens only), is one component of a comprehensive MRSA colonization surveillance program. It is not intended to diagnose MRSA infection nor to guide or monitor treatment for MRSA infections. Test performance is not FDA approved in patients less than 50 years old. Performed at [facility], [name] [address] [place], [place] [address]   Surgical PCR screen     Status: None   Collection Time: 08/12/22  1:04 PM   Specimen: Nasal Mucosa; Nasal Swab  Result Value Ref Range Status   MRSA, PCR NEGATIVE NEGATIVE Final   Staphylococcus aureus NEGATIVE NEGATIVE Final    Comment: (NOTE) The Xpert SA Assay (FDA approved for NASAL specimens in patients 70 years of age and older), is one component of a comprehensive surveillance program. It is not intended to diagnose infection nor to guide or monitor treatment. Performed at [facility], [place] [address] [place], [place] [address]      Labs:   Basic Metabolic Panel: Recent Labs  Lab 08/14/22 0743  08/15/22 0432 08/17/22 0608  NA 136 134* 133*  K 4.0 3.9 4.0  CL 105 104 101  CO2 21* 23 25  GLUCOSE 119* 116* 112*  BUN 27* 28* 22  CREATININE 1.47* 1.36* 1.38*  CALCIUM 8.7* 8.6* 8.5*   Liver Function Tests: Recent Labs  Lab 08/14/22 0743 08/15/22 0432  AST 36 34  ALT 15 16  ALKPHOS 81 78  BILITOT 3.0* 2.5*  PROT 6.1* 6.2*  ALBUMIN 3.0* 2.9*    Recent Labs  Lab 08/15/22 0432  AMMONIA 29   CBC: Recent Labs  Lab 08/14/22 0743 08/15/22 0432  WBC 8.2 7.8  HGB 12.1* 12.3*  HCT 35.7* 33.5*  MCV 103.5* 100.6*  PLT 95* 106*    CBG: Recent Labs  Lab 08/19/22 1154 08/19/22 1607 08/19/22 2105 08/20/22 0823 08/20/22 1137  GLUCAP 173* 123* 140* 119* 154*     IMAGING STUDIES DG Chest 2 View  Result Date: 08/13/2022 CLINICAL DATA:  Provided history: Cardiac device in-situ. Pacemaker implant. EXAM: CHEST - 2 VIEW COMPARISON:  Prior chest radiographs 08/11/2022 and earlier. Thoracic and lumbar spine CT 08/10/2022. FINDINGS: Shallow inspiration radiograph. Left chest dual lead implantable cardiac device with leads terminating in the regions of the right atrium and right ventricle.  The cardiomediastinal silhouette is unchanged. Aortic atherosclerosis. Prominence of the intra lung markings compatible with interstitial edema. Possible trace bilateral pleural effusions. No evidence of pneumothorax. Vertebral compression fracture at the thoracolumbar junction, new from the prior thoracic spine CT of 08/10/2022 and likely acute. Impression #6 will be called to the ordering clinician or representative by the Radiologist Assistant, and communication documented in the PACS or [employer]. IMPRESSION: 1. Shallow inspiration radiograph. 2. Left chest dual-lead implantable cardiac device with leads terminating in the regions of the right atrium and right ventricle. 3. Pulmonary interstitial edema. 4. Possible trace bilateral pleural effusions. 5. Aortic Atherosclerosis (ICD10-I70.0).  6. Acute vertebral compression fracture at the thoracolumbar junction, new from the prior thoracic and lumbar spine CT of 08/10/2022. The posterior elements are excluded from the field of view at this level, and dedicated thoracic and lumbar spine radiographs are recommended for further evaluation. Electronically Signed   By: Kellie Simmering D.O.   On: 08/13/2022 08:23   EP PPM/ICD IMPLANT  Result Date: 08/12/2022 SURGEON:  Allegra Lai, MD   PREPROCEDURE DIAGNOSIS:  complete AV block   POSTPROCEDURE DIAGNOSIS:  complete AV block    PROCEDURES:  1. Pacemaker implantation.   INTRODUCTION:  [name] is a 89 y.o. male with a history of bradycardia who presents today for pacemaker implantation.  The patient reports intermittent episodes of dizziness over the past few months.  No reversible causes have been identified.  The patient therefore presents today for pacemaker implantation.   DESCRIPTION OF PROCEDURE:  Informed written consent was obtained, and  the patient was brought to the electrophysiology lab in a fasting state.  The patient required no sedation for the procedure today.  The patients left chest was prepped and draped in the usual sterile fashion by the EP lab staff. The skin overlying the left deltopectoral region was infiltrated with lidocaine for local analgesia.  A 4-cm incision was made over the left deltopectoral region.  A left subcutaneous pacemaker pocket was fashioned using a combination of sharp and blunt dissection. Electrocautery was required to assure hemostasis.  RA/RV Lead Placement: The left axillary vein was therefore cannulated.  Through the left axillary vein, a Abbott Ultipace 1231-52  (serial number  [id number]) right atrial lead and an Abbott Ultipace 1231-65 (serial number  [id number]) right ventricular lead were advanced with fluoroscopic visualization into the right atrial appendage and right ventricular apex positions respectively.  Initial atrial lead P- waves measured 4.6 mV  with impedance of 587 ohms and a threshold of 1 V at 0.5 msec.  Right ventricular lead R-waves paced from temp wire with an impedance of 674 ohms and a threshold of 1.2 V at 0.5 msec.  Both leads were secured to the pectoralis fascia using #2-0 silk over the suture sleeves. Device Placement:  The leads were then connected to an [place]  (serial number  [id number] ) pacemaker.  The pocket was irrigated with copious gentamicin solution.  The pacemaker was then placed into the pocket.  The pocket was then closed in 3 layers with 2.0 Vicryl suture for the 3.0 Vicryl suture subcutaneous and subcuticular layers.  Steri-  Strips and a sterile dressing were then applied. EBL<33m.  There were no early apparent complications.   CONCLUSIONS:  1. Successful implantation of a Abbott Assurity [id number]-chamber pacemaker for symptomatic bradycardia  2. No early apparent complications.       Will CCurt Bears MD 08/12/2022 12:16 PM  ECHOCARDIOGRAM COMPLETE  Result Date:  08/12/2022    ECHOCARDIOGRAM REPORT   Patient Name:   [name] Date of Exam: 08/12/2022 Medical Rec #:  [id number]    Height:       66.0 in Accession #:    [id number]   Weight:       203.3 lb Date of Birth:  Dec 08, 1933    BSA:          2.014 m Patient Age:    47 years     BP:           162/59 mmHg Patient Gender: M            HR:           60 bpm. Exam Location:  Inpatient Procedure: 2D Echo, Cardiac Doppler, Color Doppler and Intracardiac            Opacification Agent Indications:    Syncope R55  History:        Patient has prior history of Echocardiogram examinations, most                 recent 02/02/2022. Angina and CAD, TIA, Arrythmias:Atrial                 Fibrillation, Signs/Symptoms:Chest Pain and Syncope; Risk                 Factors:Hypertension, Sleep Apnea, Diabetes and Dyslipidemia.                 CKD, stage III.  Sonographer:    Ronny Flurry Referring Phys: [id number] [place]  1. Left ventricular ejection fraction,  by estimation, is 65 to 70%. The left ventricle has normal function. The left ventricle has no regional wall motion abnormalities. Left ventricular diastolic parameters are indeterminate.  2. Right ventricular systolic function is normal. The right ventricular size is normal. There is normal pulmonary artery systolic pressure.  3. The mitral valve is degenerative. No evidence of mitral valve regurgitation. No evidence of mitral stenosis. Moderate to severe mitral annular calcification.  4. The aortic valve is tricuspid. There is moderate calcification of the aortic valve. There is moderate thickening of the aortic valve. Aortic valve regurgitation is trivial. Moderate aortic valve stenosis. Aortic valve area, by VTI measures 1.24 cm. Aortic valve mean gradient measures 21.5 mmHg. Aortic valve Vmax measures 3.15 m/s.  5. The inferior vena cava is dilated in size with >50% respiratory variability, suggesting right atrial pressure of 8 mmHg. FINDINGS  Left Ventricle: Left ventricular ejection fraction, by estimation, is 65 to 70%. The left ventricle has normal function. The left ventricle has no regional wall motion abnormalities. Definity contrast agent was given IV to delineate the left ventricular  endocardial borders. The left ventricular internal cavity size was normal in size. There is no left ventricular hypertrophy. Left ventricular diastolic parameters are indeterminate. Right Ventricle: The right ventricular size is normal. No increase in right ventricular wall thickness. Right ventricular systolic function is normal. There is normal pulmonary artery systolic pressure. The tricuspid regurgitant velocity is 1.51 m/s, and  with an assumed right atrial pressure of 8 mmHg, the estimated right ventricular systolic pressure is 25.8 mmHg. Left Atrium: Left atrial size was normal in size. Right Atrium: Right atrial size was normal in size. Pericardium: There is no evidence of pericardial effusion. Mitral Valve: The  mitral valve is degenerative in appearance. Moderate to severe mitral annular calcification. No evidence of mitral valve regurgitation. No evidence of mitral valve stenosis.  Tricuspid Valve: The tricuspid valve is normal in structure. Tricuspid valve regurgitation is mild . No evidence of tricuspid stenosis. Aortic Valve: The aortic valve is tricuspid. There is moderate calcification of the aortic valve. There is moderate thickening of the aortic valve. Aortic valve regurgitation is trivial. Moderate aortic stenosis is present. Aortic valve mean gradient measures 21.5 mmHg. Aortic valve peak gradient measures 39.7 mmHg. Aortic valve area, by VTI measures 1.24 cm. Pulmonic Valve: The pulmonic valve was normal in structure. Pulmonic valve regurgitation is not visualized. No evidence of pulmonic stenosis. Aorta: The aortic root is normal in size and structure. Venous: The inferior vena cava is dilated in size with greater than 50% respiratory variability, suggesting right atrial pressure of 8 mmHg. IAS/Shunts: No atrial level shunt detected by color flow Doppler.  LEFT VENTRICLE PLAX 2D LVOT diam:     2.00 cm LV SV:         79 LV SV Index:   39 LVOT Area:     3.14 cm  RIGHT VENTRICLE             IVC RV S prime:     15.40 cm/s  IVC diam: 2.20 cm TAPSE (M-mode): 2.0 cm LEFT ATRIUM           Index        RIGHT ATRIUM           Index LA Vol (A4C): 49.9 ml 24.78 ml/m  RA Area:     15.40 cm                                    RA Volume:   38.80 ml  19.27 ml/m  AORTIC VALVE AV Area (Vmax):    1.36 cm AV Area (Vmean):   1.30 cm AV Area (VTI):     1.24 cm AV Vmax:           315.00 cm/s AV Vmean:          213.000 cm/s AV VTI:            0.634 m AV Peak Grad:      39.7 mmHg AV Mean Grad:      21.5 mmHg LVOT Vmax:         136.67 cm/s LVOT Vmean:        88.433 cm/s LVOT VTI:          0.251 m LVOT/AV VTI ratio: 0.40  AORTA Ao Root diam: 3.30 cm Ao Asc diam:  3.00 cm MITRAL VALVE                TRICUSPID VALVE MV Area (PHT):  2.95 cm     TR Peak grad:   9.1 mmHg MV Decel Time: 257 msec     TR Vmax:        151.00 cm/s MV E velocity: 123.00 cm/s MV A velocity: 143.00 cm/s  SHUNTS MV E/A ratio:  0.86         Systemic VTI:  0.25 m                             Systemic Diam: 2.00 cm Skeet Latch MD Electronically signed by Skeet Latch MD Signature Date/Time: 08/12/2022/11:09:49 AM    Final    US Abdomen Limited RUQ (LIVER/GB)  Result Date: 08/12/2022 CLINICAL DATA:  Bilirubinemia. EXAM: ULTRASOUND ABDOMEN LIMITED RIGHT  UPPER QUADRANT COMPARISON:  September 08, 2020 FINDINGS: Gallbladder: The gallbladder is surgically absent. Common bile duct: Diameter: 6.52 mm Liver: No focal lesion identified. The liver parenchyma is coarse in echotexture and diffusely decreased in echogenicity. Portal vein is patent on color Doppler imaging with normal direction of blood flow towards the liver. Other: None. IMPRESSION: 1. Findings consistent with history of prior cholecystectomy. 2. Findings suggestive of hepatic cirrhosis without focal liver lesions. Electronically Signed   By: Virgina Norfolk M.D.   On: 08/12/2022 00:20   DG CHEST PORT 1 VIEW  Result Date: 08/11/2022 CLINICAL DATA:  Heart block EXAM: PORTABLE CHEST 1 VIEW COMPARISON:  X-ray 08/11/2022 and older.  CT 08/10/2022 FINDINGS: Overlapping cardiac leads and defibrillator pads. There is a right IJ line with tip extending to the right ventricle. Possible pacemaker lead. Enlarged cardiopericardial silhouette with calcified aorta. Vascular congestion. No pneumothorax or effusion. No consolidation. IMPRESSION: Presumed placement of a ventricular pacemaker lead via the right IJ. No pneumothorax. Electronically Signed   By: Jill Side M.D.   On: 08/11/2022 21:15   CARDIAC CATHETERIZATION  Result Date: 08/11/2022 Images from the original result were not included. Temporary transvenous pacemaker implantation 08/11/2022: Procedure performed: Ultrasound-guided access of the right  internal jugular vein and placement of a 6 French sheath, placement of a temporary transvenous pacemaker into the RV apex. Pacemaker; pacemaker is at 40 cm.  Capture was obtained at <0.5 mm.  Pacemaker set at 60 bpm at 10 mA, VVI mode. Adrian Prows, MD, [facility] 08/11/2022, 8:15 PM Office: [phone] Fax: [phone] Pager: [phone]   DG Chest Portable 1 View  Result Date: 08/11/2022 CLINICAL DATA:  Fall with chest compression EXAM: PORTABLE CHEST 1 VIEW COMPARISON:  Yesterday FINDINGS: Stable heart size and mediastinal contours. Mild interstitial coarsening at the bases. There is no edema, consolidation, effusion, or pneumothorax. IMPRESSION: No acute or interval finding. Electronically Signed   By: Jorje Guild M.D.   On: 08/11/2022 06:20   CT Head Wo Contrast  Result Date: 08/10/2022 CLINICAL DATA:  Fall, hit head on floor, hematoma to back of head EXAM: CT HEAD WITHOUT CONTRAST CT CERVICAL SPINE WITHOUT CONTRAST TECHNIQUE: Multidetector CT imaging of the head and cervical spine was performed following the standard protocol without intravenous contrast. Multiplanar CT image reconstructions of the cervical spine were also generated. RADIATION DOSE REDUCTION: This exam was performed according to the departmental dose-optimization program which includes automated exposure control, adjustment of the mA and/or kV according to patient size and/or use of iterative reconstruction technique. COMPARISON:  None Available. FINDINGS: CT HEAD FINDINGS Brain: No evidence of acute infarction, hemorrhage, hydrocephalus, extra-axial collection or mass lesion/mass effect. Periventricular and deep white matter hypodensity. Vascular: No hyperdense vessel or unexpected calcification. Skull: Normal. Negative for fracture or focal lesion. Sinuses/Orbits: No acute finding. Other: Soft tissue contusion of the scalp vertex (series 4, image 8). CT CERVICAL SPINE FINDINGS Alignment: Normal. Skull base and vertebrae: No acute  fracture. No primary bone lesion or focal pathologic process. Soft tissues and spinal canal: No prevertebral fluid or swelling. No visible canal hematoma. Disc levels: Focally moderate disc space height loss and osteophytosis of C5-C7 with otherwise preserved disc spaces. Upper chest: Negative. Other: None. IMPRESSION: 1. No acute intracranial pathology. Small-vessel white matter disease. 2. Soft tissue contusion of the scalp vertex. 3. No fracture or static subluxation of the cervical spine. Electronically Signed   By: Delanna Ahmadi M.D.   On: 08/10/2022 19:20   CT Cervical Spine Wo Contrast  Result Date: 08/10/2022 CLINICAL DATA:  Fall, hit head on floor, hematoma to back of head EXAM: CT HEAD WITHOUT CONTRAST CT CERVICAL SPINE WITHOUT CONTRAST TECHNIQUE: Multidetector CT imaging of the head and cervical spine was performed following the standard protocol without intravenous contrast. Multiplanar CT image reconstructions of the cervical spine were also generated. RADIATION DOSE REDUCTION: This exam was performed according to the departmental dose-optimization program which includes automated exposure control, adjustment of the mA and/or kV according to patient size and/or use of iterative reconstruction technique. COMPARISON:  None Available. FINDINGS: CT HEAD FINDINGS Brain: No evidence of acute infarction, hemorrhage, hydrocephalus, extra-axial collection or mass lesion/mass effect. Periventricular and deep white matter hypodensity. Vascular: No hyperdense vessel or unexpected calcification. Skull: Normal. Negative for fracture or focal lesion. Sinuses/Orbits: No acute finding. Other: Soft tissue contusion of the scalp vertex (series 4, image 8). CT CERVICAL SPINE FINDINGS Alignment: Normal. Skull base and vertebrae: No acute fracture. No primary bone lesion or focal pathologic process. Soft tissues and spinal canal: No prevertebral fluid or swelling. No visible canal hematoma. Disc levels: Focally moderate  disc space height loss and osteophytosis of C5-C7 with otherwise preserved disc spaces. Upper chest: Negative. Other: None. IMPRESSION: 1. No acute intracranial pathology. Small-vessel white matter disease. 2. Soft tissue contusion of the scalp vertex. 3. No fracture or static subluxation of the cervical spine. Electronically Signed   By: Delanna Ahmadi M.D.   On: 08/10/2022 19:20   CT CHEST ABDOMEN PELVIS W CONTRAST  Result Date: 08/10/2022 CLINICAL DATA:  Fall, syncope, hit head, complains of thoracic back pain to palpation, history of prostate cancer * Tracking Code: BO * EXAM: CT CHEST, ABDOMEN, AND PELVIS WITH CONTRAST CT THORACIC AND LUMBAR SPINE WITH CONTRAST TECHNIQUE: Multidetector CT imaging of the chest, abdomen and pelvis was performed following the standard protocol during bolus administration of intravenous contrast. Multidetector CT imaging of the thoracic and lumbar spine was performed following the standard protocol during bolus administration of intravenous contrast. RADIATION DOSE REDUCTION: This exam was performed according to the departmental dose-optimization program which includes automated exposure control, adjustment of the mA and/or kV according to patient size and/or use of iterative reconstruction technique. CONTRAST:  38m OMNIPAQUE IOHEXOL 350 MG/ML SOLN COMPARISON:  CT abdomen pelvis, 10/18/2020 FINDINGS: CT CHEST FINDINGS Cardiovascular: Aortic atherosclerosis. Dense aortic valve calcifications. Normal heart size. Three-vessel coronary artery calcifications. Very dense mitral annulus calcifications. No pericardial effusion. Mediastinum/Nodes: No enlarged mediastinal, hilar, or axillary lymph nodes. Small hiatal hernia. Thyroid gland, trachea, and esophagus demonstrate no significant findings. Lungs/Pleura: Mild paraseptal emphysema. Diffuse bilateral bronchial wall thickening. Dependent bibasilar scarring and or atelectasis. No pleural effusion or pneumothorax. Musculoskeletal: No  chest wall mass or suspicious osseous lesions identified. CT ABDOMEN PELVIS FINDINGS Hepatobiliary: No solid liver abnormality is seen. Small benign left lobe liver cysts, for which no further follow-up or characterization is required. Coarse nodular cirrhotic morphology of the liver. Status post cholecystectomy. No biliary ductal dilatation. Pancreas: Unremarkable. No pancreatic ductal dilatation or surrounding inflammatory changes. Spleen: Splenomegaly, maximum coronal span 15.0 cm. Adrenals/Urinary Tract: Adrenal glands are unremarkable. Severely atrophic right kidney with a small nonobstructive calculus of the midportion. Left kidney is normal. No ureteral calculi or hydronephrosis. Bladder is unremarkable. Stomach/Bowel: Stomach is within normal limits. Appendix appears normal. No evidence of bowel wall thickening, distention, or inflammatory changes. Descending and sigmoid diverticulosis. Vascular/Lymphatic: Aortic atherosclerosis. 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 No enlarged abdominal or pelvic lymph nodes.  Reproductive: Status post prostatectomy. Other: Small fat containing bilateral inguinal hernias.  No ascites. Musculoskeletal: No acute osseous findings. CT THORACIC AND LUMBAR SPINE FINDINGS Alignment: Gentle dextroscoliosis of the thoracic spine, apex T6, with otherwise normal thoracic kyphosis. Normal lumbar lordosis. Vertebral bodies: Osteopenia. Probable very subtle superior endplate deformity of L1 with no significant anterior height loss (series 6, image 34). Unchanged, chronic Schmorl deformity of the superior endplate of L3 (series 6, image 41). No fracture or dislocation. Disc spaces: Mild multilevel disc space height loss and osteophytosis throughout the thoracic and lumbar spine, with bridging osteophytosis of the mid to lower thoracic spine in keeping with DISH. Paraspinous soft tissues: Unremarkable. IMPRESSION: 1. No CT evidence of acute  traumatic injury to the chest, abdomen, or pelvis. 2. Probable very subtle superior endplate deformity of L1 with no significant anterior height loss. Correlate for acute point tenderness. 3. Osteopenia. 4. Coronary artery disease. 5. Dense aortic valve calcifications. Correlate for echocardiographic evidence of aortic valve dysfunction. 6. Cirrhosis and splenomegaly. 7. Severely atrophic right kidney with a small nonobstructive calculus of the midportion. 8. Descending and sigmoid diverticulosis without evidence of acute diverticulitis. 9. Status post prostatectomy. No evidence of lymphadenopathy or metastatic disease in the abdomen or pelvis. 10. Aortic atherosclerosis. 11. Emphysema. Aortic Atherosclerosis (ICD10-I70.0) and Emphysema (ICD10-J43.9). Electronically Signed   By: Delanna Ahmadi M.D.   On: 08/10/2022 19:16   CT L-SPINE NO CHARGE  Result Date: 08/10/2022 CLINICAL DATA:  Fall, syncope, hit head, complains of thoracic back pain to palpation, history of prostate cancer * Tracking Code: BO * EXAM: CT CHEST, ABDOMEN, AND PELVIS WITH CONTRAST CT THORACIC AND LUMBAR SPINE WITH CONTRAST TECHNIQUE: Multidetector CT imaging of the chest, abdomen and pelvis was performed following the standard protocol during bolus administration of intravenous contrast. Multidetector CT imaging of the thoracic and lumbar spine was performed following the standard protocol during bolus administration of intravenous contrast. RADIATION DOSE REDUCTION: This exam was performed according to the departmental dose-optimization program which includes automated exposure control, adjustment of the mA and/or kV according to patient size and/or use of iterative reconstruction technique. CONTRAST:  31m OMNIPAQUE IOHEXOL 350 MG/ML SOLN COMPARISON:  CT abdomen pelvis, 10/18/2020 FINDINGS: CT CHEST FINDINGS Cardiovascular: Aortic atherosclerosis. Dense aortic valve calcifications. Normal heart size. Three-vessel coronary artery  calcifications. Very dense mitral annulus calcifications. No pericardial effusion. Mediastinum/Nodes: No enlarged mediastinal, hilar, or axillary lymph nodes. Small hiatal hernia. Thyroid gland, trachea, and esophagus demonstrate no significant findings. Lungs/Pleura: Mild paraseptal emphysema. Diffuse bilateral bronchial wall thickening. Dependent bibasilar scarring and or atelectasis. No pleural effusion or pneumothorax. Musculoskeletal: No chest wall mass or suspicious osseous lesions identified. CT ABDOMEN PELVIS FINDINGS Hepatobiliary: No solid liver abnormality is seen. Small benign left lobe liver cysts, for which no further follow-up or characterization is required. Coarse nodular cirrhotic morphology of the liver. Status post cholecystectomy. No biliary ductal dilatation. Pancreas: Unremarkable. No pancreatic ductal dilatation or surrounding inflammatory changes. Spleen: Splenomegaly, maximum coronal span 15.0 cm. Adrenals/Urinary Tract: Adrenal glands are unremarkable. Severely atrophic right kidney with a small nonobstructive calculus of the midportion. Left kidney is normal. No ureteral calculi or hydronephrosis. Bladder is unremarkable. Stomach/Bowel: Stomach is within normal limits. Appendix appears normal. No evidence of bowel wall thickening, distention, or inflammatory changes. Descending and sigmoid diverticulosis. Vascular/Lymphatic: Aortic atherosclerosis. 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 No enlarged abdominal or pelvic lymph nodes. Reproductive: Status post prostatectomy. Other: Small fat containing bilateral inguinal hernias.  No  ascites. Musculoskeletal: No acute osseous findings. CT THORACIC AND LUMBAR SPINE FINDINGS Alignment: Gentle dextroscoliosis of the thoracic spine, apex T6, with otherwise normal thoracic kyphosis. Normal lumbar lordosis. Vertebral bodies: Osteopenia. Probable very subtle superior endplate deformity of L1 with no  significant anterior height loss (series 6, image 34). Unchanged, chronic Schmorl deformity of the superior endplate of L3 (series 6, image 41). No fracture or dislocation. Disc spaces: Mild multilevel disc space height loss and osteophytosis throughout the thoracic and lumbar spine, with bridging osteophytosis of the mid to lower thoracic spine in keeping with DISH. Paraspinous soft tissues: Unremarkable. IMPRESSION: 1. No CT evidence of acute traumatic injury to the chest, abdomen, or pelvis. 2. Probable very subtle superior endplate deformity of L1 with no significant anterior height loss. Correlate for acute point tenderness. 3. Osteopenia. 4. Coronary artery disease. 5. Dense aortic valve calcifications. Correlate for echocardiographic evidence of aortic valve dysfunction. 6. Cirrhosis and splenomegaly. 7. Severely atrophic right kidney with a small nonobstructive calculus of the midportion. 8. Descending and sigmoid diverticulosis without evidence of acute diverticulitis. 9. Status post prostatectomy. No evidence of lymphadenopathy or metastatic disease in the abdomen or pelvis. 10. Aortic atherosclerosis. 11. Emphysema. Aortic Atherosclerosis (ICD10-I70.0) and Emphysema (ICD10-J43.9). Electronically Signed   By: Delanna Ahmadi M.D.   On: 08/10/2022 19:16   CT T-SPINE NO CHARGE  Result Date: 08/10/2022 CLINICAL DATA:  Fall, syncope, hit head, complains of thoracic back pain to palpation, history of prostate cancer * Tracking Code: BO * EXAM: CT CHEST, ABDOMEN, AND PELVIS WITH CONTRAST CT THORACIC AND LUMBAR SPINE WITH CONTRAST TECHNIQUE: Multidetector CT imaging of the chest, abdomen and pelvis was performed following the standard protocol during bolus administration of intravenous contrast. Multidetector CT imaging of the thoracic and lumbar spine was performed following the standard protocol during bolus administration of intravenous contrast. RADIATION DOSE REDUCTION: This exam was performed according to  the departmental dose-optimization program which includes automated exposure control, adjustment of the mA and/or kV according to patient size and/or use of iterative reconstruction technique. CONTRAST:  58m OMNIPAQUE IOHEXOL 350 MG/ML SOLN COMPARISON:  CT abdomen pelvis, 10/18/2020 FINDINGS: CT CHEST FINDINGS Cardiovascular: Aortic atherosclerosis. Dense aortic valve calcifications. Normal heart size. Three-vessel coronary artery calcifications. Very dense mitral annulus calcifications. No pericardial effusion. Mediastinum/Nodes: No enlarged mediastinal, hilar, or axillary lymph nodes. Small hiatal hernia. Thyroid gland, trachea, and esophagus demonstrate no significant findings. Lungs/Pleura: Mild paraseptal emphysema. Diffuse bilateral bronchial wall thickening. Dependent bibasilar scarring and or atelectasis. No pleural effusion or pneumothorax. Musculoskeletal: No chest wall mass or suspicious osseous lesions identified. CT ABDOMEN PELVIS FINDINGS Hepatobiliary: No solid liver abnormality is seen. Small benign left lobe liver cysts, for which no further follow-up or characterization is required. Coarse nodular cirrhotic morphology of the liver. Status post cholecystectomy. No biliary ductal dilatation. Pancreas: Unremarkable. No pancreatic ductal dilatation or surrounding inflammatory changes. Spleen: Splenomegaly, maximum coronal span 15.0 cm. Adrenals/Urinary Tract: Adrenal glands are unremarkable. Severely atrophic right kidney with a small nonobstructive calculus of the midportion. Left kidney is normal. No ureteral calculi or hydronephrosis. Bladder is unremarkable. Stomach/Bowel: Stomach is within normal limits. Appendix appears normal. No evidence of bowel wall thickening, distention, or inflammatory changes. Descending and sigmoid diverticulosis. Vascular/Lymphatic: Aortic atherosclerosis. 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 No enlarged abdominal or  pelvic lymph nodes. Reproductive: Status post prostatectomy. Other: Small fat containing bilateral inguinal hernias.  No ascites. Musculoskeletal: No acute osseous findings. CT THORACIC AND LUMBAR SPINE FINDINGS  Alignment: Gentle dextroscoliosis of the thoracic spine, apex T6, with otherwise normal thoracic kyphosis. Normal lumbar lordosis. Vertebral bodies: Osteopenia. Probable very subtle superior endplate deformity of L1 with no significant anterior height loss (series 6, image 34). Unchanged, chronic Schmorl deformity of the superior endplate of L3 (series 6, image 41). No fracture or dislocation. Disc spaces: Mild multilevel disc space height loss and osteophytosis throughout the thoracic and lumbar spine, with bridging osteophytosis of the mid to lower thoracic spine in keeping with DISH. Paraspinous soft tissues: Unremarkable. IMPRESSION: 1. No CT evidence of acute traumatic injury to the chest, abdomen, or pelvis. 2. Probable very subtle superior endplate deformity of L1 with no significant anterior height loss. Correlate for acute point tenderness. 3. Osteopenia. 4. Coronary artery disease. 5. Dense aortic valve calcifications. Correlate for echocardiographic evidence of aortic valve dysfunction. 6. Cirrhosis and splenomegaly. 7. Severely atrophic right kidney with a small nonobstructive calculus of the midportion. 8. Descending and sigmoid diverticulosis without evidence of acute diverticulitis. 9. Status post prostatectomy. No evidence of lymphadenopathy or metastatic disease in the abdomen or pelvis. 10. Aortic atherosclerosis. 11. Emphysema. Aortic Atherosclerosis (ICD10-I70.0) and Emphysema (ICD10-J43.9). Electronically Signed   By: Delanna Ahmadi M.D.   On: 08/10/2022 19:16   DG Pelvis Portable  Result Date: 08/10/2022 CLINICAL DATA:  Fall, right-sided hip pain EXAM: PORTABLE PELVIS 1-2 VIEWS COMPARISON:  None Available. FINDINGS: Osteopenia. There is no evidence of displaced pelvic fracture or  diastasis. No pelvic bone lesions are seen. IMPRESSION: Osteopenia. No displaced fracture of the pelvis or bilateral proximal femurs seen in single frontal view. Please note that plain radiographs are significantly insensitive for hip and pelvic fracture. Recommend CT or MRI to more sensitively evaluate if there is high clinical suspicion for fracture. Electronically Signed   By: Delanna Ahmadi M.D.   On: 08/10/2022 17:11   DG Chest Portable 1 View  Result Date: 08/10/2022 CLINICAL DATA:  Back and right-sided chest pain following recent fall, initial encounter EXAM: PORTABLE CHEST 1 VIEW COMPARISON:  11/10/2020 FINDINGS: The heart size and mediastinal contours are within normal limits. Both lungs are clear. Bony structures show a vague lucency in the superior aspect of the scapula. An undisplaced fracture cannot be totally excluded given the patient's clinical history. IMPRESSION: No focal infiltrate noted. Changes suspicious for right scapular fracture. Correlate to point tenderness. Cross-sectional imaging may be helpful as clinically indicated. Electronically Signed   By: Inez Catalina M.D.   On: 08/10/2022 17:10    DISCHARGE EXAMINATION: Vitals:   08/19/22 2108 08/20/22 0225 08/20/22 0300 08/20/22 0817  BP: (!) 165/66 136/65  (!) 133/54  Pulse: 79 77  78  Resp: '16 18  16  '$ Temp: 97.7 F (36.5 C) 97.6 F (36.4 C)  98 F (36.7 C)  TempSrc: Oral Oral    SpO2: 97% 94%  95%  Weight:   93.8 kg   Height:         Awake Alert, frail, mildly confused Symmetrical Chest wall movement, Good air movement bilaterally, CTAB mild left upper chest pacemaker, significant bruising in the upper chest area has been stable over the last few days RRR,No Gallops,Rubs or new Murmurs, No Parasternal Heave +ve B.Sounds, Abd Soft, No tenderness, No rebound - guarding or rigidity. No Cyanosis, Clubbing or edema, No new Rash or bruise      DISPOSITION: SNF versus inpatient rehabilitation  Discharge Instructions      Call MD for:  difficulty breathing, headache or visual disturbances   Complete  by: As directed    Call MD for:  extreme fatigue   Complete by: As directed    Call MD for:  persistant dizziness or light-headedness   Complete by: As directed    Call MD for:  persistant nausea and vomiting   Complete by: As directed    Call MD for:  redness, tenderness, or signs of infection (pain, swelling, redness, odor or green/yellow discharge around incision site)   Complete by: As directed    Call MD for:  severe uncontrolled pain   Complete by: As directed    Call MD for:  temperature >100.4   Complete by: As directed    Diet - low sodium heart healthy   Complete by: As directed    Discharge instructions   Complete by: As directed    Please be sure to follow-up with your primary care provider early next week.  Cardiology will arrange outpatient follow-up for you.  Please take your medications as prescribed.  Follow instructions provided by the heart doctor.  You were cared for by a hospitalist during your hospital stay. If you have any questions about your discharge medications or the care you received while you were in the hospital after you are discharged, you can call the unit and asked to speak with the hospitalist on call if the hospitalist that took care of you is not available. Once you are discharged, your primary care physician will handle any further medical issues. Please note that NO REFILLS for any discharge medications will be authorized once you are discharged, as it is imperative that you return to your primary care physician (or establish a relationship with a primary care physician if you do not have one) for your aftercare needs so that they can reassess your need for medications and monitor your lab values. If you do not have a primary care physician, you can call [phone] for a physician referral.   Increase activity slowly   Complete by: As directed          Allergies as of  08/20/2022       Reactions   Ciprofloxacin Other (See Comments)   Hallucinations or jitteriness   Codeine Other (See Comments)   Hallucinations   Flexeril [cyclobenzaprine] Other (See Comments)   "Made me feel goofy"   Imdur [isosorbide Nitrate] Other (See Comments)   Caused headaches   Methocarbamol Other (See Comments)   "Made me feel goofy"   Other Other (See Comments)   CANNOT HAVE ANY FOODS WITH SEEDS   Strawberry Extract Other (See Comments)   CANNOT HAVE ANY FOODS WITH SEEDS   Sulfa Antibiotics Other (See Comments)   Chills and shaking- "serum sickness"   Sulfamethoxazole Other (See Comments)   Chills and shaking- "serum sickness"   Sulfonamide Derivatives Other (See Comments)   Chills and shaking "serum sickness"   Tomato Other (See Comments)   CANNOT HAVE ANY FOODS WITH SEEDS   Flagyl [metronidazole] Rash        Medication List     TAKE these medications    aspirin EC 81 MG tablet Take 81 mg by mouth daily.   blood glucose meter kit and supplies Kit Dispense based on patient and insurance preference. Use up to four times daily as directed.   camphor-menthol lotion Commonly known as: SARNA Apply topically as needed for itching.   Farxiga 5 MG Tabs tablet Generic drug: dapagliflozin propanediol TAKE 1 TABLET BY MOUTH EVERY DAY BEFORE BREAKFAST What changed: See the  new instructions.   ipratropium 0.03 % nasal spray Commonly known as: ATROVENT Place 2 sprays into the nose 2 (two) times daily. What changed:  when to take this reasons to take this   lidocaine 5 % Commonly known as: LIDODERM Place 1 patch onto the skin daily. Remove & Discard patch within 12 hours or as directed by MD   melatonin 3 MG Tabs tablet Take 1 tablet (3 mg total) by mouth at bedtime.   nitroGLYCERIN 0.4 MG/SPRAY spray Commonly known as: NITROLINGUAL Place 1 spray under the tongue every 5 (five) minutes x 3 doses as needed for chest pain.   oxyCODONE 5 MG immediate release  tablet Commonly known as: Oxy IR/ROXICODONE Take 1 tablet (5 mg total) by mouth every 4 (four) hours as needed for severe pain or breakthrough pain.   polyethylene glycol 17 g packet Commonly known as: MIRALAX / GLYCOLAX Take 17 g by mouth 2 (two) times daily.   senna-docusate 8.6-50 MG tablet Commonly known as: Senokot-S Take 2 tablets by mouth 2 (two) times daily.   thyroid 120 MG tablet Commonly known as: Armour Thyroid Take 1 tablet (120 mg total) by mouth daily.               Durable Medical Equipment  (From admission, onward)           Start     Ordered   08/14/22 1211  For home use only DME Bedside commode  Once       Question Answer Comment  Patient needs a bedside commode to treat with the following condition Pacemaker   Patient needs a bedside commode to treat with the following condition Hx of TIA (transient ischemic attack) and stroke      08/14/22 1211   08/14/22 0919  For home use only DME 4 wheeled rolling walker with seat  Once       Question:  Patient needs a walker to treat with the following condition  Answer:  Physical deconditioning   08/14/22 0919   08/14/22 0919  For home use only DME 3 n 1  Once        08/14/22 0919              Follow-up Information     Copland, Gay Filler, MD. Schedule an appointment as soon as possible for a visit in 1 week(s).   Specialty: Family Medicine Why: post hospitalization follow up Contact information: [address] [address] [phone]         Constance Haw, MD Follow up.   Specialty: Cardiology Contact information: [address] [place] [address] [phone]                  Phillips Climes  [facility] Pager on [email or web address]  08/20/2022, 12:20 PM

## 2022-08-20 NOTE — Plan of Care (Signed)
  Problem: Education: Goal: Knowledge of General Education information will improve Description: Including pain rating scale, medication(s)/side effects and non-pharmacologic comfort measures Outcome: Progressing   Problem: Health Behavior/Discharge Planning: Goal: Ability to manage health-related needs will improve Outcome: Progressing   Problem: Clinical Measurements: Goal: Ability to maintain clinical measurements within normal limits will improve Outcome: Progressing Goal: Will remain free from infection Outcome: Progressing Goal: Diagnostic test results will improve Outcome: Progressing Goal: Respiratory complications will improve Outcome: Progressing Goal: Cardiovascular complication will be avoided Outcome: Progressing   Problem: Activity: Goal: Risk for activity intolerance will decrease Outcome: Progressing   Problem: Nutrition: Goal: Adequate nutrition will be maintained Outcome: Progressing   Problem: Coping: Goal: Level of anxiety will decrease Outcome: Progressing   Problem: Elimination: Goal: Will not experience complications related to bowel motility Outcome: Progressing Goal: Will not experience complications related to urinary retention Outcome: Progressing   Problem: Pain Managment: Goal: General experience of comfort will improve Outcome: Progressing   Problem: Safety: Goal: Ability to remain free from injury will improve Outcome: Progressing   Problem: Skin Integrity: Goal: Risk for impaired skin integrity will decrease Outcome: Progressing   Problem: Education: Goal: Knowledge of cardiac device and self-care will improve Outcome: Progressing Goal: Ability to safely manage health related needs after discharge will improve Outcome: Progressing Goal: Individualized Educational Video(s) Outcome: Progressing   Problem: Cardiac: Goal: Ability to achieve and maintain adequate cardiopulmonary perfusion will improve Outcome: Progressing    Problem: Education: Goal: Ability to describe self-care measures that may prevent or decrease complications (Diabetes Survival Skills Education) will improve Outcome: Progressing Goal: Individualized Educational Video(s) Outcome: Progressing   Problem: Coping: Goal: Ability to adjust to condition or change in health will improve Outcome: Progressing   Problem: Fluid Volume: Goal: Ability to maintain a balanced intake and output will improve Outcome: Progressing   Problem: Health Behavior/Discharge Planning: Goal: Ability to identify and utilize available resources and services will improve Outcome: Progressing Goal: Ability to manage health-related needs will improve Outcome: Progressing   Problem: Metabolic: Goal: Ability to maintain appropriate glucose levels will improve Outcome: Progressing   Problem: Nutritional: Goal: Maintenance of adequate nutrition will improve Outcome: Progressing Goal: Progress toward achieving an optimal weight will improve Outcome: Progressing   Problem: Skin Integrity: Goal: Risk for impaired skin integrity will decrease Outcome: Progressing   Problem: Tissue Perfusion: Goal: Adequacy of tissue perfusion will improve Outcome: Progressing   Problem: Safety: Goal: Non-violent Restraint(s) Outcome: Progressing

## 2022-08-20 NOTE — Care Management Important Message (Signed)
Important Message  Patient Details  Name: [name] MRN: [id number] Date of Birth: Feb 26, 1934   Medicare Important Message Given:  Yes     Orbie Pyo 08/20/2022, 1:47 PM

## 2022-08-20 NOTE — Plan of Care (Signed)
Report Called to [employer].

## 2022-08-20 NOTE — TOC Progression Note (Addendum)
Transition of Care [facility]) - Progression Note    Patient Details  Name: [name] MRN: [id number] Date of Birth: [birth year]  Transition of Care [facility]) CM/SW [place], RN Phone Number: 08/20/2022, 10:21 AM  Clinical Narrative:    CM spoke with the patient's daughter, Lenna Sciara this morning and gave Medicare choice regarding bed offers.  The patient's daughter chose [facility].  Insurance authorization will be started this morning.  Clinicals were uploaded in [facility] hub for insurance authorization - pending at this time.  08/20/2022 1217 - CM spoke with Lexine Baton, CM at [facility] and patient received insurance authorization and can discharge to the facility today.  Shiela, Admissions with [facility] plans to meet with the patient's son at bedside at 2:20 today to fill out admission paperwork.  PTAR is scheduled for 3:30.  Bedside nursing - please call report to [facility] at [phone].   Expected Discharge Plan: [place] Barriers to Discharge: Continued Medical Work up  Expected Discharge Plan and Services   Discharge Planning Services: CM Consult Post Acute Care Choice: [place] Living arrangements for the past 2 months: Single Family Home Expected Discharge Date: 08/14/22               DME Arranged: Gilford Rile rolling with seat, 3-N-1 DME Agency: [employer]       HH Arranged: PT, OT           Social Determinants of Health (SDOH) Interventions SDOH Screenings   Food Insecurity: No Food Insecurity (08/11/2022)  Housing: Low Risk  (08/11/2022)  Transportation Needs: No Transportation Needs (08/11/2022)  Utilities: Not At Risk (08/11/2022)  Alcohol Screen: Low Risk  (01/12/2022)  Depression (PHQ2-9): Low Risk  (01/12/2022)  Financial Resource Strain: Low Risk  (09/18/2019)  Physical Activity: Inactive (01/12/2022)  Social Connections: Moderately Isolated (01/12/2022)  Stress: No Stress Concern Present (01/12/2022)   Tobacco Use: Medium Risk (08/12/2022)    Readmission Risk Interventions    08/19/2022    3:35 PM  Readmission Risk Prevention Plan  Transportation Screening Complete  PCP or Specialist Appt within 5-7 Days Complete  Home Care Screening Complete  Medication Review (RN CM) Complete

## 2022-08-20 NOTE — Progress Notes (Signed)
Mobility Specialist - Progress Note   08/20/22 1023  Mobility  Activity Ambulated with assistance in hallway  Level of Assistance Minimal assist, patient does 75% or more  Assistive Device Front wheel walker  Distance Ambulated (ft) 100 ft  Activity Response Tolerated well  Mobility Referral Yes  $Mobility charge 1 Mobility   Pt received in bed and agreeable to session. Pt required multiple vocal cues throughout session. Pt left in chair with all needs met and chair alarm on.  Franki Monte  Mobility Specialist Please contact via Solicitor or Rehab office at [phone]

## 2022-08-24 ENCOUNTER — Telehealth: Payer: Self-pay | Admitting: Family Medicine

## 2022-08-24 ENCOUNTER — Telehealth: Payer: Self-pay | Admitting: Cardiology

## 2022-08-24 NOTE — Telephone Encounter (Signed)
Wife is calling back to see if someone can come to the rehab center for the appt. Please advise

## 2022-08-24 NOTE — Telephone Encounter (Signed)
Returned call to wife.  Advised that Rehab should be able to provide transportation for Pt to come to wound check appt.  Put appt back on schedule.  Wife will discuss with [facility] and will call device clinic back directly if they are unable to transport on scheduled day.  Await further needs.

## 2022-08-24 NOTE — Telephone Encounter (Signed)
Returned call to wife.  She has spoken with the PA at [facility] about Pt's wound check scheduled for Wednesday.  PA was going to figure out transportation with staff.  Wife will call with any further concerns.

## 2022-08-24 NOTE — Telephone Encounter (Signed)
  Pt's wife called, she cancelled pt's wound check appt. She said, pt was transferred to [facility] and pt might stay there in 2-3 weeks. She said, she doesn't know what is going on with pt's treatment and plan of care, she doesn't know if someone will check pt's wound while in rehab

## 2022-08-24 NOTE — Telephone Encounter (Signed)
Called Melissa back- no answer.  Called wife Deloris  Reford is at a rehab facility after his pacemaker He is not on any medication for sleep right now  Also, she is not sure how we will get an rx to him while he is at rehab  Seroquel may be a good choice, I will just run this by his electrophysiologist to make sure they do not have a concern-assuming this is okay I can call [facility] and get him this prescription  Will plan to start with Seroquel 25, increase to 50 as needed  [facility] from living and rehab Address: [address] [place], [place] [address] Phone: [phone]

## 2022-08-24 NOTE — Telephone Encounter (Signed)
[name] (daughter DPR OK) called stating that pt is having issues with confusion and agitation during the night hours and was wondering if meds could be prescribed to help him sleep. Pt is at [facility] facility and isn't in any position to come in for an appt.

## 2022-08-25 ENCOUNTER — Telehealth: Payer: Self-pay | Admitting: Family Medicine

## 2022-08-25 ENCOUNTER — Ambulatory Visit: Payer: Medicare Other | Attending: Cardiovascular Disease

## 2022-08-25 DIAGNOSIS — I442 Atrioventricular block, complete: Secondary | ICD-10-CM

## 2022-08-25 LAB — CUP PACEART INCLINIC DEVICE CHECK
Battery Remaining Longevity: 73 mo
Battery Voltage: 3.01 V
Brady Statistic RA Percent Paced: 0.11 %
Brady Statistic RV Percent Paced: 99.22 %
Date Time Interrogation Session: 20240213103441
Implantable Lead Connection Status: 753985
Implantable Lead Connection Status: 753985
Implantable Lead Implant Date: 20240131
Implantable Lead Implant Date: 20240131
Implantable Lead Location: 753859
Implantable Lead Location: 753860
Implantable Pulse Generator Implant Date: 20240131
Lead Channel Impedance Value: 487.5 Ohm
Lead Channel Impedance Value: 525 Ohm
Lead Channel Pacing Threshold Amplitude: 0.5 V
Lead Channel Pacing Threshold Amplitude: 0.5 V
Lead Channel Pacing Threshold Amplitude: 1 V
Lead Channel Pacing Threshold Amplitude: 1 V
Lead Channel Pacing Threshold Pulse Width: 0.5 ms
Lead Channel Pacing Threshold Pulse Width: 0.5 ms
Lead Channel Pacing Threshold Pulse Width: 0.5 ms
Lead Channel Pacing Threshold Pulse Width: 0.5 ms
Lead Channel Sensing Intrinsic Amplitude: 11.1 mV
Lead Channel Sensing Intrinsic Amplitude: 5 mV
Lead Channel Setting Pacing Amplitude: 3.5 V
Lead Channel Setting Pacing Amplitude: 3.5 V
Lead Channel Setting Pacing Pulse Width: 0.5 ms
Lead Channel Setting Sensing Sensitivity: 4 mV
Pulse Gen Model: 2272
Pulse Gen Serial Number: 5680536

## 2022-08-25 NOTE — Telephone Encounter (Signed)
I heard back from patient's electrocardio physiologist, Dr. Curt Bears who felt Seroquel would be reasonable for him. He is at [facility]- I called over there, they put me in contact with nursing.  Their staff MD is gone for the day- I am not able to start meds while he is here  I left a message asking for the provider to call me back so I can start seroquel or ask that they start it I updated Melissa, daughter, about this  She notes they gave him Remeron last night and this worked but he was overly sedated- we may try to change to seroquel if needed  JC

## 2022-08-25 NOTE — Progress Notes (Signed)
Wound check appointment. Steri-strips removed. Wound without redness or edema. Incision edges approximated with exception of very small superficial opening at distal end of incsion.  Area cleaned with NS, steri strip re-applied to open area and wound care instructions given to wife and facility: [facility].   Instructions for patient cleared to shower in 3 days, allow strips to fall off on their own and monitor for infection.  No further appt needed to follow up with device clinic unless wound does not continue to heal or any concerns.  Facility given device clinic number to call if needed.   Normal device function. Thresholds, sensing, and impedances consistent with implant measurements. Device programmed at 3.5V/auto capture programmed on for extra safety margin until 3 month visit. Histogram distribution appropriate for patient and level of activity. AT/AF burden <1%, 10 MS episodes all appear brief runs of AT/Aflutter fastest A rate 205bpm, longest episode 8 secs, ventricular rates controlled.  Patient has known PAF, is on ASA, no OAC has hx of GI bleed, will continue to monitor.   Patient/wife/facility educated about wound care, arm mobility, lifting restrictions. ROV in 3 months with implanting physician.  Did instruct wife to take remote monitor to facility to plug in at bedside.  She will call device clinic once this has been done and discuss how to send a manual transmission to ensure proper set up.  Wife verbalizes understanding of importance for ongoing monitoring.

## 2022-08-25 NOTE — Patient Instructions (Addendum)
   After Your Pacemaker   Monitor your pacemaker site for redness, swelling, and drainage. Call the device clinic at [phone] if you experience these symptoms or fever/chills . Your incision wound is healing; however, you still have 1 small area that is open and was re-stripped today.  Wait 3 days before showering. Monitor area for signs and symptoms of infection: redness, swelling, drainage, fever, chills, call our device clinic above if any concerns.  Strips should fall off on own.  You do not need a covering over the steri-strip area.   Do not apply any lotions, ointments, powders, perfumes to the area until it is well healed.   You may use a hot tub or a pool after your wound check appointment if the incision is completely closed.  Do not lift, push or pull greater than 10 pounds (or a gallon of milk) with the affected arm until 6 weeks after your procedure.  Until AFTER MARCH 13TH. There are no other restrictions in arm movement after your wound check appointment.  You may drive, unless driving has been restricted by your healthcare providers.  Remote monitoring is used to monitor your pacemaker from home. This monitoring is scheduled every 91 days by our office. It allows [place] to keep an eye on the functioning of your device to ensure it is working properly. You will routinely see your Electrophysiologist annually (more often if necessary).

## 2022-08-26 ENCOUNTER — Ambulatory Visit: Payer: Medicare Other

## 2022-09-12 NOTE — Progress Notes (Deleted)
[facility] [address] [place], [place] [address] [phone]  Date:  09/16/2022   Name:  [name]   DOB:  06/15/1934   MRN:  [id number]  PCP:  [name], MD    Chief Complaint: No chief complaint on file.   History of Present Illness:  [name] is a 89 y.o. very pleasant male patient who presents with the following:  Patient seen today for hospital discharge follow-up Last seen by myself in July: Admit date: 08/10/2022 Discharge date:   08/20/2022 DISCHARGE DIAGNOSES:    Second degree heart block   Uncontrolled type 2 diabetes mellitus with hyperglycemia, without long-term current use of insulin ([place])   GERD without esophagitis   OSA (obstructive sleep apnea)   Hypothyroidism, acquired   Essential hypertension   Hyperlipemia   Chronic kidney disease, stage 3b (HCC)   AF (paroxysmal atrial fibrillation) ([place])   Coronary artery disease involving native coronary artery of native heart   Cardiac syncope RECOMMENDATIONS FOR OUTPATIENT FOLLOW UP: Patient has an appointment on 2/14 at 8:40 AM for pacemaker wound check at the cardiovascular disease office on [facility] Please check CBC and basic metabolic panel in 3 to 4 days Please check TSH and free T4 in 3 to 4 weeks Home Health: SNF   INITIAL HISTORY: 89 y.o. male with medical history significant for chronic dizziness/balance issues for at least 2 years, coronary artery disease status post PCI with stenting, paroxysmal A-fib, previously on Xarelto and no longer on DOAC after GI bleeding episodes, prostate cancer status post radical prostatectomy in 1998, hypertension, hyperlipidemia with intolerance to statin, history of carotid artery disease status post left carotid endarterectomy in 1998, OSA on CPAP, bilateral sensorineural hearing dysfunction, and TIA, who presented to [facility] ED from home via EMS due to multiple syncopal episodes at home.  Patient  indicates that he had no prodrome or symptoms other than noticing that the room had moved and that approximately halfway through his fall he notes loss of consciousness and awoke on the floor with his wife over him.  Loss of consciousness may be a few minutes, no postictal or confusion once aroused.  Given second fall where he struck his head on the kitchen floor the wife called EMS for transport to the hospital. Imaging unremarkable for fracture or bleed or acute findings.  Hospitalist called for admission.    Consultations: Cardiology Procedures: Pacemaker placement HOSPITAL COURSE:  Syncope secondary to second-degree heart block  Patient underwent temporary pacemaker placement initially.  This was followed by placement of permanent pacemaker on 1/31. Seems to be stable from cardiac standpoint.  Cardiology to arrange outpatient follow-up. Elevated troponin, suspect demand ischemia in the setting of syncope Minimally elevated.  Likely due to demand ischemia.  Patient denies any chest pain. Echocardiogram showed LVEF to be 65 to 70%.  No regional wall motion abnormalities were noted.  Moderate aortic valve stenosis was seen. Acute encephalopathy Likely secondary to second-degree heart block.  Patient likely also has cognitive impairment at baseline.  Patient has a history of liver cirrhosis.  Ammonia level however was normal.   B12 level 3201, folate 8.1.  RPR is nonreactive.  HIV is nonreactive. Further workup can be pursued in the outpatient setting.  Mentation is stable for the most part. Prolonged Qtc Avoid QT prolonging medications.  Monitor electrolytes periodically. Paroxysmal A-fib, previously on Xarelto  No longer on anticoagulation after history  of GI bleeding episodes Not on rate control agents, rate is controlled Elevated AST, bilirubin secondary to history of liver cirrhosis Ultrasound suggested liver cirrhosis.  Ammonia level noted to be normal.  Will need further evaluation and  management and perhaps referral to gastroenterology as an outpatient. Thrombocytopenia Secondary to liver cirrhosis.  No evidence for bleeding currently. Macrocytic anemia Hemoglobin is stable.  Anemia panel does not show any clear deficiencies.  Macrocytosis is likely due to hypothyroidism. Hypothyroidism TSH noted to be 12.47 with a free T4 of 0.82.  Currently on 120 mg of thyroid Armour.  Will recommend that TSH level be rechecked in 3 to 4 weeks before considering changes to his medication regimen. CKD 3B Seems to be close to baseline.  Monitor urine output.  Avoid nephrotoxic agents. Chronic diastolic CHF Euvolemic on exam. Mild L1 vertebral compression fracture Incidentally noted on imaging studies.  No intervention at this time.  Conservative management with physical therapy and pain control.  Lidoderm patch has been prescribed. Obesity Estimated body mass index is 33.38    Patient Active Problem List   Diagnosis Date Noted   Second degree heart block 08/11/2022   Sinus arrest 08/11/2022   Cardiac syncope 08/11/2022   Syncope and collapse 08/10/2022   Uncontrolled type 2 diabetes mellitus with hyperglycemia, without long-term current use of insulin ([place]) 09/09/2020   Chest pain 09/08/2020   Acute diverticulitis 02/07/2019   Angina pectoris, variant ([place]) 05/19/2018   Poor compliance with CPAP treatment 04/20/2018   Excessive daytime sleepiness 03/22/2018   Poor sleep hygiene 03/22/2018   Ventral hernia without obstruction or gangrene 10/09/2017   TIA (transient ischemic attack)    Kidney stone    Hypertension    Hearing aid worn    Coronary artery disease involving native coronary artery of native heart    Asthma due to seasonal allergies    Arthritis    Erosive gastropathy 03/26/2017   Acute blood loss anemia 03/25/2017   Melena 03/24/2017   Chronic kidney disease, stage 3b ([place]) 03/24/2017   AF (paroxysmal atrial fibrillation) ([place]) 03/24/2017   Angina pectoris  ([place]) 10/09/2015   Essential hypertension    Hyperlipemia    Carotid artery occlusion    Dizziness 08/09/2015   Gait instability 05/30/2015   Hypothyroidism, acquired 10/26/2013   OSA (obstructive sleep apnea) 10/18/2013   Benign localized hyperplasia of prostate with urinary obstruction and other lower urinary tract symptoms (LUTS)(600.21) 12/21/2012   Carcinoma in situ of prostate 12/21/2012   Left shoulder pain 08/08/2012   Class 1 obesity due to excess calories with serious comorbidity and body mass index (BMI) of 34.0 to 34.9 in adult 07/24/2011   Generalized anxiety disorder 07/24/2011   Diverticulosis of colon 12/25/2009   DERMATITIS, SEBORRHEIC 12/25/2009   Noise-induced hearing loss 12/18/2009   Memory loss 08/14/2009   VENOUS INSUFFICIENCY, CHRONIC 09/09/2006   GERD without esophagitis 09/09/2006   HERNIA, HIATAL, NONCONGENITAL 09/09/2006   INSOMNIA NOS 09/09/2006    Past Medical History:  Diagnosis Date   Anginal pain ([place])    Anxiety    " OCCASIONAL"   Arthritis    Asthma due to seasonal allergies    uses inhalers prn   Carotid artery occlusion    Left   Complication of anesthesia    " had tremors after prostate surgery   Coronary artery disease    Diverticulitis    recurrent   Family history of anesthesia complication    MOTHER    GERD (gastroesophageal  reflux disease)    H/O hiatal hernia    Hearing aid worn    Hyperlipemia    Hypertension    Kidney stone    lithotripsy [id number] w complications, req stents, Dr Rosana Hoes   Prostate CA [facility])    Shortness of breath    Thyroid disease    TIA (transient ischemic attack)    Tinnitus     Past Surgical History:  Procedure Laterality Date   CARDIAC CATHETERIZATION  2008   minimal dz, Dr Cathie Olden   CARDIAC CATHETERIZATION N/A 10/09/2015   Procedure: Left Heart Cath and Coronary Angiography;  Surgeon: Peter M Martinique, MD;  Location: [place] [facility];  Service: Cardiovascular;  Laterality: N/A;   CARDIAC  CATHETERIZATION N/A 10/09/2015   Procedure: Intravascular Pressure Wire/FFR Study;  Surgeon: Peter M Martinique, MD;  Location: [place] [facility];  Service: Cardiovascular;  Laterality: N/A;   CARDIAC CATHETERIZATION N/A 10/09/2015   Procedure: Coronary Stent Intervention;  Surgeon: Peter M Martinique, MD;  Location: [place] [facility];  Service: Cardiovascular;  Laterality: N/A;   CAROTID ENDARTERECTOMY  Left   1998   CHOLECYSTECTOMY     CORONARY STENT PLACEMENT  10/09/2015   DES to RCA   ESOPHAGOGASTRODUODENOSCOPY (EGD) WITH PROPOFOL Left 03/26/2017   Procedure: ESOPHAGOGASTRODUODENOSCOPY (EGD) WITH PROPOFOL;  Surgeon: Arta Silence, MD;  Location: [facility];  Service: Endoscopy;  Laterality: Left;   LEFT HEART CATHETERIZATION WITH CORONARY ANGIOGRAM N/A 09/15/2013   Procedure: LEFT HEART CATHETERIZATION WITH CORONARY ANGIOGRAM;  Surgeon: Peter M Martinique, MD;  Location: [facility] CATH LAB;  Service: Cardiovascular;  Laterality: N/A;   PACEMAKER IMPLANT N/A 08/12/2022   Procedure: PACEMAKER IMPLANT;  Surgeon: Constance Haw, MD;  Location: [place] [facility];  Service: Cardiovascular;  Laterality: N/A;   PROSTATECTOMY  1998   radical for prostate cancer   TEMPORARY PACEMAKER N/A 08/11/2022   Procedure: TEMPORARY PACEMAKER;  Surgeon: Adrian Prows, MD;  Location: [place] [facility];  Service: Cardiovascular;  Laterality: N/A;    Social History   Tobacco Use   Smoking status: Former    Packs/day: 2.50    Years: 30.00    Total pack years: 75.00    Types: Cigarettes    Quit date: 08/28/1985    Years since quitting: 37.0   Smokeless tobacco: Never  Substance Use Topics   Alcohol use: Yes    Alcohol/week: 2.0 standard drinks of alcohol    Types: 2 Standard drinks or equivalent per week    Comment: 1 glass wine/week (prior 1 glass martini with dinner)   Drug use: No    Family History  Problem Relation Age of Onset   Heart disease Mother    Heart disease Father    Kidney failure Father     Heart disease Sister    Heart attack Sister    Dementia Sister    Sjogren's syndrome Child    Hashimoto's thyroiditis Child    CAD Child     Allergies  Allergen Reactions   Ciprofloxacin Other (See Comments)    Hallucinations or jitteriness   Codeine Other (See Comments)    Hallucinations    Flexeril [Cyclobenzaprine] Other (See Comments)    "Made me feel goofy"   Imdur [Isosorbide Nitrate] Other (See Comments)    Caused headaches   Methocarbamol Other (See Comments)    "Made me feel goofy"   Other Other (See Comments)    CANNOT HAVE ANY FOODS WITH SEEDS    Strawberry Extract Other (See  Comments)    CANNOT HAVE ANY FOODS WITH SEEDS   Sulfa Antibiotics Other (See Comments)    Chills and shaking- "serum sickness"    Sulfamethoxazole Other (See Comments)    Chills and shaking- "serum sickness"   Sulfonamide Derivatives Other (See Comments)    Chills and shaking "serum sickness"   Tomato Other (See Comments)    CANNOT HAVE ANY FOODS WITH SEEDS   Flagyl [Metronidazole] Rash    Medication list has been reviewed and updated.  Current Outpatient Medications on File Prior to Visit  Medication Sig Dispense Refill   aspirin EC 81 MG tablet Take 81 mg by mouth daily.     blood glucose meter kit and supplies KIT Dispense based on patient and insurance preference. Use up to four times daily as directed. 1 each 0   camphor-menthol (SARNA) lotion Apply topically as needed for itching. 222 mL 0   FARXIGA 5 MG TABS tablet TAKE 1 TABLET BY MOUTH EVERY DAY BEFORE BREAKFAST (Patient taking differently: Take 5 mg by mouth daily.) 30 tablet 3   ipratropium (ATROVENT) 0.03 % nasal spray Place 2 sprays into the nose 2 (two) times daily. (Patient taking differently: Place 2 sprays into the nose 2 (two) times daily as needed for rhinitis.) 60 mL 3   lidocaine (LIDODERM) 5 % Place 1 patch onto the skin daily. Remove & Discard patch within 12 hours or as directed by MD 30 patch 0   melatonin 3 MG  TABS tablet Take 1 tablet (3 mg total) by mouth at bedtime.  0   nitroGLYCERIN (NITROLINGUAL) 0.4 MG/SPRAY spray Place 1 spray under the tongue every 5 (five) minutes x 3 doses as needed for chest pain. 12 g 5   oxyCODONE (OXY IR/ROXICODONE) 5 MG immediate release tablet Take 1 tablet (5 mg total) by mouth every 4 (four) hours as needed for severe pain or breakthrough pain. 15 tablet 0   polyethylene glycol (MIRALAX / GLYCOLAX) 17 g packet Take 17 g by mouth 2 (two) times daily. 14 each 0   senna-docusate (SENOKOT-S) 8.6-50 MG tablet Take 2 tablets by mouth 2 (two) times daily.     thyroid (ARMOUR THYROID) 120 MG tablet Take 1 tablet (120 mg total) by mouth daily. 90 tablet 3   No current facility-administered medications on file prior to visit.    Review of Systems:  As per HPI- otherwise negative.   Physical Examination: There were no vitals filed for this visit. There were no vitals filed for this visit. There is no height or weight on file to calculate BMI. Ideal Body Weight:    GEN: no acute distress. HEENT: Atraumatic, Normocephalic.  Ears and Nose: No external deformity. CV: RRR, No M/G/R. No JVD. No thrill. No extra heart sounds. PULM: CTA B, no wheezes, crackles, rhonchi. No retractions. No resp. distress. No accessory muscle use. ABD: S, NT, ND, +BS. No rebound. No HSM. EXTR: No c/c/e PSYCH: Normally interactive. Conversant.    Assessment and Plan: *** Patient seen today for hospital follow-up.  As above, he was admitted about a month ago with syncope and found to have second-degree heart block.  He now has a pacemaker in place  His thyroid was off during admission, recheck TSH today  Previously hepatic steatosis was present, recent ultrasound upgraded his diagnosis to cirrhosis.  He is established with Dr. Paulita Fujita at [facility], MD

## 2022-09-14 ENCOUNTER — Telehealth: Payer: Self-pay | Admitting: Family Medicine

## 2022-09-14 ENCOUNTER — Encounter: Payer: Self-pay | Admitting: Family Medicine

## 2022-09-14 MED ORDER — HYDROCODONE-ACETAMINOPHEN 5-325 MG PO TABS: 0.5000 | ORAL_TABLET | Freq: Three times a day (TID) | ORAL | 0 refills | Status: DC | PRN

## 2022-09-14 MED ORDER — QUETIAPINE FUMARATE 25 MG PO TABS: 25.0000 mg | ORAL_TABLET | Freq: Every day | ORAL | 1 refills | Status: DC

## 2022-09-14 MED ORDER — NALOXONE HCL 4 MG/0.1ML NA LIQD: NASAL | 99 refills | Status: DC

## 2022-09-14 NOTE — Telephone Encounter (Signed)
Please advise 

## 2022-09-14 NOTE — Telephone Encounter (Signed)
Pt's wife called. She is not able to get into his mychart so she asked if we could just call her once medication is sent in.

## 2022-09-14 NOTE — Telephone Encounter (Signed)
Pt's wife returning provider's call. Spoke with Dr. Lorelei Pont and advised her that she was calling to let wife know she called in the Seroquel to help pt sleep. Ms. Loya wanted to know whether she would be calling something in for pain as well to [facility]. Please call to advise.

## 2022-09-14 NOTE — Addendum Note (Signed)
Addended by: Lamar Blinks C on: 09/14/2022 05:12 PM   Modules accepted: Orders

## 2022-09-14 NOTE — Telephone Encounter (Signed)
Called [name] back- Rajendra fractured a vertebra when he passed out and fell last month prior to his pacemaker.  See x-ray below They tried oxycodone but he "coded and needed narcan." They then changed to just 2 tylenol TID but his pain is not under control He is complaining of constant pain and his wife thinks he is suffering a lot.  They understand using narcotic pain medication does carry a risk of sedation but feel comfortable trying a conservative dose.  I will also prescribe Narcan We will start with 1/2 tablet of hydrocodone 5 mg every 8 hours as needed- they will let me know how this works  Meds ordered this encounter  Medications   HYDROcodone-acetaminophen (NORCO/VICODIN) 5-325 MG tablet    Sig: Take 0.5-1 tablets by mouth every 8 (eight) hours as needed.    Dispense:  20 tablet    Refill:  0   naloxone (NARCAN) nasal spray 4 mg/0.1 mL    Sig: Use in case of excessive sedation from opoid pain medication    Dispense:  1 each    Refill:  PRN     Chest film 08/13/2022 IMPRESSION: 1. Shallow inspiration radiograph. 2. Left chest dual-lead implantable cardiac device with leads terminating in the regions of the right atrium and right ventricle. 3. Pulmonary interstitial edema. 4. Possible trace bilateral pleural effusions. 5. Aortic Atherosclerosis (ICD10-I70.0). 6. Acute vertebral compression fracture at the thoracolumbar junction, new from the prior thoracic and lumbar spine CT of 08/10/2022. The posterior elements are excluded from the field of view at this level, and dedicated thoracic and lumbar spine radiographs are recommended for further evaluation.

## 2022-09-16 ENCOUNTER — Ambulatory Visit: Payer: Medicare Other | Admitting: Family Medicine

## 2022-09-16 DIAGNOSIS — Z950 Presence of cardiac pacemaker: Secondary | ICD-10-CM

## 2022-09-16 DIAGNOSIS — E039 Hypothyroidism, unspecified: Secondary | ICD-10-CM

## 2022-09-16 DIAGNOSIS — I441 Atrioventricular block, second degree: Secondary | ICD-10-CM

## 2022-09-16 DIAGNOSIS — K7460 Unspecified cirrhosis of liver: Secondary | ICD-10-CM

## 2022-09-29 MED ORDER — HYDROCODONE-ACETAMINOPHEN 5-325 MG PO TABS: 0.5000 | ORAL_TABLET | Freq: Three times a day (TID) | ORAL | 0 refills | Status: DC | PRN

## 2022-09-29 NOTE — Addendum Note (Signed)
Addended by: Lamar Blinks C on: 09/29/2022 11:32 AM   Modules accepted: Orders

## 2022-09-30 NOTE — Progress Notes (Addendum)
[facility] [address], [place], [place] [address] [id number]  Date:  10/01/2022   Name:  [name]   DOB:  November 02, 1933   MRN:  [id number]  PCP:  [name], MD    Chief Complaint: Back Pain (From compression fracture. Pt has been in a lot of pain since then. )   History of Present Illness:  [name] is a 89 y.o. very pleasant male patient who presents with the following:  Patient seen today for follow-up Most recent visit with myself was in July Carotid occlusion status post endarterectomy, aortic stenosis, sleep apnea, chronic renal disease, hyperlipidemia, memory loss, hard of hearing, prostate cancer status post radical prostatectomy in 1998, hypothyroidism-also chronic stable angina  At our last visit I had noted increasing carotid artery stenosis and made a referral to vascular surgery  He was recently admitted from 1/29 through 2/8 with main diagnosis of second-degree heart block He was released to SNF INITIAL HISTORY: 89 y.o. male with medical history significant for chronic dizziness/balance issues for at least 2 years, coronary artery disease status post PCI with stenting, paroxysmal A-fib, previously on Xarelto and no longer on DOAC after GI bleeding episodes, prostate cancer status post radical prostatectomy in 1998, hypertension, hyperlipidemia with intolerance to statin, history of carotid artery disease status post left carotid endarterectomy in 1998, OSA on CPAP, bilateral sensorineural hearing dysfunction, and TIA, who presented to [facility] ED from home via EMS due to multiple syncopal episodes at home.  Patient indicates that he had no prodrome or symptoms other than noticing that the room had moved and that approximately halfway through his fall he notes loss of consciousness and awoke on the floor with his wife over him.  Loss of consciousness may be a few minutes, no postictal or confusion once  aroused.  Given second fall where he struck his head on the kitchen floor the wife called EMS for transport to the hospital. Imaging unremarkable for fracture or bleed or acute findings.  Hospitalist called for admission.    Consultations: Cardiology Procedures: Pacemaker placement HOSPITAL COURSE:  Syncope secondary to second-degree heart block  Patient underwent temporary pacemaker placement initially.  This was followed by placement of permanent pacemaker on 1/31. Seems to be stable from cardiac standpoint.  Cardiology to arrange outpatient follow-up. Elevated troponin, suspect demand ischemia in the setting of syncope Minimally elevated.  Likely due to demand ischemia.  Patient denies any chest pain. Echocardiogram showed LVEF to be 65 to 70%.  No regional wall motion abnormalities were noted.  Moderate aortic valve stenosis was seen. Acute encephalopathy Likely secondary to second-degree heart block.  Patient likely also has cognitive impairment at baseline.  Patient has a history of liver cirrhosis.  Ammonia level however was normal.   B12 level 3201, folate 8.1.  RPR is nonreactive.  HIV is nonreactive. Further workup can be pursued in the outpatient setting.  Mentation is stable for the most part. Prolonged Qtc Avoid QT prolonging medications.  Monitor electrolytes periodically. Paroxysmal A-fib, previously on Xarelto  No longer on anticoagulation after history of GI bleeding episodes Not on rate control agents, rate is controlled Elevated AST, bilirubin secondary to history of liver cirrhosis Ultrasound suggested liver cirrhosis.  Ammonia level noted to be normal.  Will need further evaluation and management and perhaps referral to gastroenterology as an outpatient. Thrombocytopenia Secondary to liver cirrhosis.  No evidence for bleeding currently. Macrocytic  anemia Hemoglobin is stable.  Anemia panel does not show any clear deficiencies.  Macrocytosis is likely due to  hypothyroidism. Hypothyroidism TSH noted to be 12.47 with a free T4 of 0.82.  Currently on 120 mg of thyroid Armour.  Will recommend that TSH level be rechecked in 3 to 4 weeks before considering changes to his medication regimen. CKD 3B Seems to be close to baseline.  Monitor urine output.  Avoid nephrotoxic agents. Chronic diastolic CHF Euvolemic on exam. Mild L1 vertebral compression fracture Incidentally noted on imaging studies.  No intervention at this time.  Conservative management with physical therapy and pain control.  Lidoderm patch has been prescribed.   He was noted to have cirrhosis on CT scan during this admission-this was not seen on previous imaging Since he was released from home his family has contacted me about difficulty with sleeping and back pain due to his compression fracture We added Seroquel for sleep which has been helping I spoke with his wife about his back pain, she reports the patient was taking oxycodone at SNF and "coded", had to be given Narcan.  They were then treating him with only Tylenol but he was complaining constantly of pain.  We have been using a conservative dose of hydrocodone  They have been using hydrocodone 3 times daily, however patient's wife notes he often still complains of unrelieved pain.  Advise she can increase to 4 times daily if needed  He is using a cane and walker at home  No falls  She has some neighbors helping her when she will ask specifically but wife [name] is 33 and has heart failure- is otherwise doing everything.  She is taking care of of [name] 24/7-helping him bathe, dress, eat.   Her kids are not local -they are supportive but do not live here  He is no longer driving  Patient Active Problem List   Diagnosis Date Noted   Second degree heart block 08/11/2022   Sinus arrest 08/11/2022   Cardiac syncope 08/11/2022   Syncope and collapse 08/10/2022   Uncontrolled type 2 diabetes mellitus with hyperglycemia, without  long-term current use of insulin ([place]) 09/09/2020   Chest pain 09/08/2020   Acute diverticulitis 02/07/2019   Angina pectoris, variant ([place]) 05/19/2018   Poor compliance with CPAP treatment 04/20/2018   Excessive daytime sleepiness 03/22/2018   Poor sleep hygiene 03/22/2018   Ventral hernia without obstruction or gangrene 10/09/2017   TIA (transient ischemic attack)    Kidney stone    Hypertension    Hearing aid worn    Coronary artery disease involving native coronary artery of native heart    Asthma due to seasonal allergies    Arthritis    Erosive gastropathy 03/26/2017   Acute blood loss anemia 03/25/2017   Melena 03/24/2017   Chronic kidney disease, stage 3b ([place]) 03/24/2017   AF (paroxysmal atrial fibrillation) ([place]) 03/24/2017   Angina pectoris ([place]) 10/09/2015   Essential hypertension    Hyperlipemia    Carotid artery occlusion    Dizziness 08/09/2015   Gait instability 05/30/2015   Hypothyroidism, acquired 10/26/2013   OSA (obstructive sleep apnea) 10/18/2013   Benign localized hyperplasia of prostate with urinary obstruction and other lower urinary tract symptoms (LUTS)(600.21) 12/21/2012   Carcinoma in situ of prostate 12/21/2012   Left shoulder pain 08/08/2012   Class 1 obesity due to excess calories with serious comorbidity and body mass index (BMI) of 34.0 to 34.9 in adult 07/24/2011   Generalized anxiety disorder  07/24/2011   Diverticulosis of colon 12/25/2009   DERMATITIS, SEBORRHEIC 12/25/2009   Noise-induced hearing loss 12/18/2009   Memory loss 08/14/2009   VENOUS INSUFFICIENCY, CHRONIC 09/09/2006   GERD without esophagitis 09/09/2006   HERNIA, HIATAL, NONCONGENITAL 09/09/2006   INSOMNIA NOS 09/09/2006    Past Medical History:  Diagnosis Date   Anginal pain ([place])    Anxiety    " OCCASIONAL"   Arthritis    Asthma due to seasonal allergies    uses inhalers prn   Carotid artery occlusion    Left   Complication of anesthesia    " had tremors after  prostate surgery   Coronary artery disease    Diverticulitis    recurrent   Family history of anesthesia complication    MOTHER    GERD (gastroesophageal reflux disease)    H/O hiatal hernia    Hearing aid worn    Hyperlipemia    Hypertension    Kidney stone    lithotripsy [id number] w complications, req stents, Dr Rosana Hoes   Prostate CA [facility])    Shortness of breath    Thyroid disease    TIA (transient ischemic attack)    Tinnitus     Past Surgical History:  Procedure Laterality Date   CARDIAC CATHETERIZATION  2008   minimal dz, Dr Cathie Olden   CARDIAC CATHETERIZATION N/A 10/09/2015   Procedure: Left Heart Cath and Coronary Angiography;  Surgeon: Peter M Martinique, MD;  Location: [place] [facility];  Service: Cardiovascular;  Laterality: N/A;   CARDIAC CATHETERIZATION N/A 10/09/2015   Procedure: Intravascular Pressure Wire/FFR Study;  Surgeon: Peter M Martinique, MD;  Location: [place] [facility];  Service: Cardiovascular;  Laterality: N/A;   CARDIAC CATHETERIZATION N/A 10/09/2015   Procedure: Coronary Stent Intervention;  Surgeon: Peter M Martinique, MD;  Location: [place] [facility];  Service: Cardiovascular;  Laterality: N/A;   CAROTID ENDARTERECTOMY  Left   1998   CHOLECYSTECTOMY     CORONARY STENT PLACEMENT  10/09/2015   DES to RCA   ESOPHAGOGASTRODUODENOSCOPY (EGD) WITH PROPOFOL Left 03/26/2017   Procedure: ESOPHAGOGASTRODUODENOSCOPY (EGD) WITH PROPOFOL;  Surgeon: Arta Silence, MD;  Location: [facility];  Service: Endoscopy;  Laterality: Left;   LEFT HEART CATHETERIZATION WITH CORONARY ANGIOGRAM N/A 09/15/2013   Procedure: LEFT HEART CATHETERIZATION WITH CORONARY ANGIOGRAM;  Surgeon: Peter M Martinique, MD;  Location: [facility] CATH LAB;  Service: Cardiovascular;  Laterality: N/A;   PACEMAKER IMPLANT N/A 08/12/2022   Procedure: PACEMAKER IMPLANT;  Surgeon: Constance Haw, MD;  Location: [place] [facility];  Service: Cardiovascular;  Laterality: N/A;   PROSTATECTOMY  1998   radical for prostate  cancer   TEMPORARY PACEMAKER N/A 08/11/2022   Procedure: TEMPORARY PACEMAKER;  Surgeon: Adrian Prows, MD;  Location: [place] [facility];  Service: Cardiovascular;  Laterality: N/A;    Social History   Tobacco Use   Smoking status: Former    Packs/day: 2.50    Years: 30.00    Additional pack years: 0.00    Total pack years: 75.00    Types: Cigarettes    Quit date: 08/28/1985    Years since quitting: 37.1   Smokeless tobacco: Never  Substance Use Topics   Alcohol use: Yes    Alcohol/week: 2.0 standard drinks of alcohol    Types: 2 Standard drinks or equivalent per week    Comment: 1 glass wine/week (prior 1 glass martini with dinner)   Drug use: No    Family History  Problem Relation Age of  Onset   Heart disease Mother    Heart disease Father    Kidney failure Father    Heart disease Sister    Heart attack Sister    Dementia Sister    Sjogren's syndrome Child    Hashimoto's thyroiditis Child    CAD Child     Allergies  Allergen Reactions   Ciprofloxacin Other (See Comments)    Hallucinations or jitteriness   Codeine Other (See Comments)    Hallucinations    Flexeril [Cyclobenzaprine] Other (See Comments)    "Made me feel goofy"   Imdur [Isosorbide Nitrate] Other (See Comments)    Caused headaches   Methocarbamol Other (See Comments)    "Made me feel goofy"   Other Other (See Comments)    CANNOT HAVE ANY FOODS WITH SEEDS    Strawberry Extract Other (See Comments)    CANNOT HAVE ANY FOODS WITH SEEDS   Sulfa Antibiotics Other (See Comments)    Chills and shaking- "serum sickness"    Sulfamethoxazole Other (See Comments)    Chills and shaking- "serum sickness"   Sulfonamide Derivatives Other (See Comments)    Chills and shaking "serum sickness"   Tomato Other (See Comments)    CANNOT HAVE ANY FOODS WITH SEEDS   Flagyl [Metronidazole] Rash    Medication list has been reviewed and updated.  Current Outpatient Medications on File Prior to Visit  Medication  Sig Dispense Refill   aspirin EC 81 MG tablet Take 81 mg by mouth daily.     blood glucose meter kit and supplies KIT Dispense based on patient and insurance preference. Use up to four times daily as directed. 1 each 0   camphor-menthol (SARNA) lotion Apply topically as needed for itching. 222 mL 0   FARXIGA 5 MG TABS tablet TAKE 1 TABLET BY MOUTH EVERY DAY BEFORE BREAKFAST (Patient taking differently: Take 5 mg by mouth daily.) 30 tablet 3   HYDROcodone-acetaminophen (NORCO/VICODIN) 5-325 MG tablet Take 0.5-1 tablets by mouth every 8 (eight) hours as needed. 30 tablet 0   ipratropium (ATROVENT) 0.03 % nasal spray Place 2 sprays into the nose 2 (two) times daily. (Patient taking differently: Place 2 sprays into the nose 2 (two) times daily as needed for rhinitis.) 60 mL 3   lidocaine (LIDODERM) 5 % Place 1 patch onto the skin daily. Remove & Discard patch within 12 hours or as directed by MD 30 patch 0   melatonin 3 MG TABS tablet Take 1 tablet (3 mg total) by mouth at bedtime.  0   naloxone (NARCAN) nasal spray 4 mg/0.1 mL Use in case of excessive sedation from opoid pain medication 1 each PRN   nitroGLYCERIN (NITROLINGUAL) 0.4 MG/SPRAY spray Place 1 spray under the tongue every 5 (five) minutes x 3 doses as needed for chest pain. 12 g 5   polyethylene glycol (MIRALAX / GLYCOLAX) 17 g packet Take 17 g by mouth 2 (two) times daily. 14 each 0   QUEtiapine (SEROQUEL) 25 MG tablet Take 1 tablet (25 mg total) by mouth at bedtime. Can increase to 50 mg as directed by MD 60 tablet 1   senna-docusate (SENOKOT-S) 8.6-50 MG tablet Take 2 tablets by mouth 2 (two) times daily.     thyroid (ARMOUR THYROID) 120 MG tablet Take 1 tablet (120 mg total) by mouth daily. 90 tablet 3   No current facility-administered medications on file prior to visit.    Review of Systems:  As per HPI- otherwise negative.   Physical  Examination: Vitals:   10/01/22 1427  BP: 122/72  Pulse: 95  Resp: 18  Temp: 97.6 F  (36.4 C)  SpO2: 98%   Vitals:   There is no height or weight on file to calculate BMI. Ideal Body Weight:    GEN: no acute distress.  Chronically ill, elderly man seated in wheelchair.  He does not have his hearing aids with him today and he is nearly deaf without them.  Communicating mostly with his wife today HEENT: Atraumatic, Normocephalic.  Ears and Nose: No external deformity. CV: RRR, No M/G/R. No JVD. No thrill. No extra heart sounds. PULM: CTA B, no wheezes, crackles, rhonchi. No retractions. No resp. distress. No accessory muscle use. ABD: S, NT, ND EXTR: No c/c/e PSYCH: Normally interactive. Conversant.    Assessment and Plan: Second degree heart block  Hospital discharge follow-up  Cirrhosis of liver without ascites, unspecified hepatic cirrhosis type ([place]) - Plan: CBC, Comprehensive metabolic panel  Primary insomnia  Compression fracture of L1 vertebra with routine healing, subsequent encounter  Hypothyroidism, acquired - Plan: TSH  Frail elderly  Patient is seen today for follow-up.  As detailed above, he recently experienced syncope, ended up being diagnosed with second-degree heart block and has a pacemaker in place.  When he fell during syncopal episode he also sustained a lumbar compression fracture Patient's main concern is still uncontrolled back pain.  Advised he can take a hydrocodone 5 every 6 hours instead of every 8 if needed  Discussed cirrhosis of liver seen on imaging with wife.  Given his other health issues they do not want to pursue this further at this time  Follow-up on TSH today  Had a frank discussion with [facility] today.  I think trying to take care of [name] by herself is too much.  She is coming to his realization as well She is strongly considering moving closer to one of her kids and into an assisted living facility.  Since this will take time, I encouraged her to contact [facility] [place] or another agency right away and arrange for some  in-home assistance for herself and her husband   Signed Lamar Blinks, MD  Received labs 3/2- message to pt Results for orders placed or performed in visit on 10/01/22  CBC  Result Value Ref Range   WBC 3.6 (L) 4.0 - 10.5 K/uL   RBC 3.82 (L) 4.22 - 5.81 Mil/uL   Platelets 103.0 (L) 150.0 - 400.0 K/uL   Hemoglobin 13.9 13.0 - 17.0 g/dL   HCT 41.0 39.0 - 52.0 %   MCV 107.5 (H) 78.0 - 100.0 fl   MCHC 33.9 30.0 - 36.0 g/dL   RDW 15.7 (H) 11.5 - 15.5 %  Comprehensive metabolic panel  Result Value Ref Range   Sodium 140 135 - 145 mEq/L   Potassium 4.7 3.5 - 5.1 mEq/L   Chloride 103 96 - 112 mEq/L   CO2 26 19 - 32 mEq/L   Glucose, Bld 110 (H) 70 - 99 mg/dL   BUN 19 6 - 23 mg/dL   Creatinine, Ser 2.03 (H) 0.40 - 1.50 mg/dL   Total Bilirubin 2.2 (H) 0.2 - 1.2 mg/dL   Alkaline Phosphatase 135 (H) 39 - 117 U/L   AST 39 (H) 0 - 37 U/L   ALT 20 0 - 53 U/L   Total Protein 7.2 6.0 - 8.3 g/dL   Albumin 3.9 3.5 - 5.2 g/dL   GFR 29.01 (L) >60.00 mL/min   Calcium 9.4 8.4 -  10.5 mg/dL  TSH  Result Value Ref Range   TSH 24.23 (H) 0.35 - 5.50 uIU/mL

## 2022-10-01 ENCOUNTER — Ambulatory Visit: Payer: Medicare Other | Admitting: Family Medicine

## 2022-10-01 VITALS — BP 122/72 | HR 95 | Temp 97.6°F | Resp 18

## 2022-10-01 DIAGNOSIS — K7460 Unspecified cirrhosis of liver: Secondary | ICD-10-CM | POA: Diagnosis not present

## 2022-10-01 DIAGNOSIS — Z09 Encounter for follow-up examination after completed treatment for conditions other than malignant neoplasm: Secondary | ICD-10-CM | POA: Diagnosis not present

## 2022-10-01 DIAGNOSIS — E039 Hypothyroidism, unspecified: Secondary | ICD-10-CM | POA: Diagnosis not present

## 2022-10-01 DIAGNOSIS — S32010D Wedge compression fracture of first lumbar vertebra, subsequent encounter for fracture with routine healing: Secondary | ICD-10-CM

## 2022-10-01 DIAGNOSIS — R54 Age-related physical debility: Secondary | ICD-10-CM

## 2022-10-01 DIAGNOSIS — I441 Atrioventricular block, second degree: Secondary | ICD-10-CM

## 2022-10-01 DIAGNOSIS — F5101 Primary insomnia: Secondary | ICD-10-CM | POA: Diagnosis not present

## 2022-10-01 NOTE — Patient Instructions (Addendum)
It was good to see you today- I am sorry you are having such a hard time  We will get some labs today I can refill your pain medication when you need it  Please get some help at home- also would suggest that you move closer to one of you children and/ or into assisted living  [place], [place]  [place] to increase hydrocodone to every 6 hours if needed for pain control- this will make you run out early which is ok, I can refill

## 2022-10-02 ENCOUNTER — Encounter: Payer: Self-pay | Admitting: Family Medicine

## 2022-10-02 LAB — CBC
HCT: 41 % (ref 39.0–52.0)
Hemoglobin: 13.9 g/dL (ref 13.0–17.0)
MCHC: 33.9 g/dL (ref 30.0–36.0)
MCV: 107.5 fl — ABNORMAL HIGH (ref 78.0–100.0)
Platelets: 103 10*3/uL — ABNORMAL LOW (ref 150.0–400.0)
RBC: 3.82 Mil/uL — ABNORMAL LOW (ref 4.22–5.81)
RDW: 15.7 % — ABNORMAL HIGH (ref 11.5–15.5)
WBC: 3.6 10*3/uL — ABNORMAL LOW (ref 4.0–10.5)

## 2022-10-02 LAB — COMPREHENSIVE METABOLIC PANEL
ALT: 20 U/L (ref 0–53)
AST: 39 U/L — ABNORMAL HIGH (ref 0–37)
Albumin: 3.9 g/dL (ref 3.5–5.2)
Alkaline Phosphatase: 135 U/L — ABNORMAL HIGH (ref 39–117)
BUN: 19 mg/dL (ref 6–23)
CO2: 26 mEq/L (ref 19–32)
Calcium: 9.4 mg/dL (ref 8.4–10.5)
Chloride: 103 mEq/L (ref 96–112)
Creatinine, Ser: 2.03 mg/dL — ABNORMAL HIGH (ref 0.40–1.50)
GFR: 29.01 mL/min — ABNORMAL LOW (ref >60.00)
Glucose, Bld: 110 mg/dL — ABNORMAL HIGH (ref 70–99)
Potassium: 4.7 mEq/L (ref 3.5–5.1)
Sodium: 140 mEq/L (ref 135–145)
Total Bilirubin: 2.2 mg/dL — ABNORMAL HIGH (ref 0.2–1.2)
Total Protein: 7.2 g/dL (ref 6.0–8.3)

## 2022-10-02 LAB — TSH: TSH: 24.23 u[IU]/mL — ABNORMAL HIGH (ref 0.35–5.50)

## 2022-10-12 ENCOUNTER — Encounter: Payer: Self-pay | Admitting: Family Medicine

## 2022-10-12 MED ORDER — HYDROCODONE-ACETAMINOPHEN 5-325 MG PO TABS: 0.5000 | ORAL_TABLET | Freq: Three times a day (TID) | ORAL | 0 refills | Status: DC | PRN

## 2022-10-14 ENCOUNTER — Encounter: Payer: Medicare Other | Admitting: Cardiology

## 2022-11-10 ENCOUNTER — Encounter: Payer: Self-pay | Admitting: Family Medicine

## 2022-11-10 DIAGNOSIS — S22000A Wedge compression fracture of unspecified thoracic vertebra, initial encounter for closed fracture: Secondary | ICD-10-CM

## 2022-11-10 DIAGNOSIS — R451 Restlessness and agitation: Secondary | ICD-10-CM

## 2022-11-12 ENCOUNTER — Ambulatory Visit (INDEPENDENT_AMBULATORY_CARE_PROVIDER_SITE_OTHER): Payer: Medicare Other

## 2022-11-12 DIAGNOSIS — I442 Atrioventricular block, complete: Secondary | ICD-10-CM

## 2022-11-12 LAB — CUP PACEART REMOTE DEVICE CHECK
Battery Remaining Longevity: 63 mo
Battery Remaining Percentage: 95.5 %
Battery Voltage: 2.99 V
Brady Statistic AP VP Percent: 1 %
Brady Statistic AP VS Percent: 1 %
Brady Statistic AS VP Percent: 99 %
Brady Statistic AS VS Percent: 1 %
Brady Statistic RA Percent Paced: 1 %
Brady Statistic RV Percent Paced: 99 %
Date Time Interrogation Session: 20240502105432
Implantable Lead Connection Status: 753985
Implantable Lead Connection Status: 753985
Implantable Lead Implant Date: 20240131
Implantable Lead Implant Date: 20240131
Implantable Lead Location: 753859
Implantable Lead Location: 753860
Implantable Pulse Generator Implant Date: 20240131
Lead Channel Impedance Value: 450 Ohm
Lead Channel Impedance Value: 480 Ohm
Lead Channel Pacing Threshold Amplitude: 0.5 V
Lead Channel Pacing Threshold Amplitude: 1 V
Lead Channel Pacing Threshold Pulse Width: 0.5 ms
Lead Channel Pacing Threshold Pulse Width: 0.5 ms
Lead Channel Sensing Intrinsic Amplitude: 11.1 mV
Lead Channel Sensing Intrinsic Amplitude: 5 mV
Lead Channel Setting Pacing Amplitude: 3.5 V
Lead Channel Setting Pacing Amplitude: 3.5 V
Lead Channel Setting Pacing Pulse Width: 0.5 ms
Lead Channel Setting Sensing Sensitivity: 4 mV
Pulse Gen Model: 2272
Pulse Gen Serial Number: 5680536

## 2022-11-16 ENCOUNTER — Ambulatory Visit: Payer: Medicare Other | Attending: Cardiology | Admitting: Cardiology

## 2022-11-16 NOTE — Progress Notes (Deleted)
Electrophysiology Office Note   Date:  11/16/2022   ID:  [name], DOB [birth year], MRN [id number]  PCP:  Pearline Cables, MD  Cardiologist:  [place] Primary Electrophysiologist:  Regan Lemming, MD    Chief Complaint: pacemaker   History of Present Illness: [name] is a 89 y.o. male who is being seen today for the evaluation of pacemaker at the request of Copland, Gwenlyn Found, MD. Presenting today for electrophysiology evaluation.  He has a history significant for coronary artery disease post RCA stent, prostate cancer post radical prostatectomy, hypertension, hyperlipidemia, carotid artery disease post CEA, atrial fibrillation, GI bleed, TIA.  Who presented to the hospital after episodes of syncope.  He was found to have intermittent complete heart block.  He is now status post Abbott dual-chamber pacemaker implanted to 08/12/2022.  Today, he denies*** symptoms of palpitations, chest pain, shortness of breath, orthopnea, PND, lower extremity edema, claudication, dizziness, presyncope, syncope, bleeding, or neurologic sequela. The patient is tolerating medications without difficulties.    Past Medical History:  Diagnosis Date   Anginal pain (HCC)    Anxiety    " OCCASIONAL"   Arthritis    Asthma due to seasonal allergies    uses inhalers prn   Carotid artery occlusion    Left   Complication of anesthesia    " had tremors after prostate surgery   Coronary artery disease    Diverticulitis    recurrent   Family history of anesthesia complication    MOTHER    GERD (gastroesophageal reflux disease)    H/O hiatal hernia    Hearing aid worn    Hyperlipemia    Hypertension    Kidney stone    lithotripsy 2011 w complications, req stents, Dr Earlene Plater   Prostate CA [facility])    Shortness of breath    Thyroid disease    TIA (transient ischemic attack)    Tinnitus    Past Surgical History:  Procedure Laterality Date   CARDIAC CATHETERIZATION  2008   minimal dz, Dr  Melburn Popper   CARDIAC CATHETERIZATION N/A 10/09/2015   Procedure: Left Heart Cath and Coronary Angiography;  Surgeon: Peter M [place], MD;  Location: [facility] INVASIVE CV LAB;  Service: Cardiovascular;  Laterality: N/A;   CARDIAC CATHETERIZATION N/A 10/09/2015   Procedure: Intravascular Pressure Wire/FFR Study;  Surgeon: Peter M [place], MD;  Location: [facility] INVASIVE CV LAB;  Service: Cardiovascular;  Laterality: N/A;   CARDIAC CATHETERIZATION N/A 10/09/2015   Procedure: Coronary Stent Intervention;  Surgeon: Peter M [place], MD;  Location: [facility] INVASIVE CV LAB;  Service: Cardiovascular;  Laterality: N/A;   CAROTID ENDARTERECTOMY  Left   1998   CHOLECYSTECTOMY     CORONARY STENT PLACEMENT  10/09/2015   DES to RCA   ESOPHAGOGASTRODUODENOSCOPY (EGD) WITH PROPOFOL Left 03/26/2017   Procedure: ESOPHAGOGASTRODUODENOSCOPY (EGD) WITH PROPOFOL;  Surgeon: Willis Modena, MD;  Location: [facility] ENDOSCOPY;  Service: Endoscopy;  Laterality: Left;   LEFT HEART CATHETERIZATION WITH CORONARY ANGIOGRAM N/A 09/15/2013   Procedure: LEFT HEART CATHETERIZATION WITH CORONARY ANGIOGRAM;  Surgeon: Peter M [place], MD;  Location: [facility] CATH LAB;  Service: Cardiovascular;  Laterality: N/A;   PACEMAKER IMPLANT N/A 08/12/2022   Procedure: PACEMAKER IMPLANT;  Surgeon: Regan Lemming, MD;  Location: [facility] INVASIVE CV LAB;  Service: Cardiovascular;  Laterality: N/A;   PROSTATECTOMY  1998   radical for prostate cancer   TEMPORARY PACEMAKER N/A 08/11/2022   Procedure: TEMPORARY PACEMAKER;  Surgeon: Yates Decamp, MD;  Location: [facility]  INVASIVE CV LAB;  Service: Cardiovascular;  Laterality: N/A;     Current Outpatient Medications  Medication Sig Dispense Refill   aspirin EC 81 MG tablet Take 81 mg by mouth daily.     blood glucose meter kit and supplies KIT Dispense based on patient and insurance preference. Use up to four times daily as directed. 1 each 0   camphor-menthol (SARNA) lotion Apply topically as needed for itching. 222 mL 0   FARXIGA 5 MG TABS  tablet TAKE 1 TABLET BY MOUTH EVERY DAY BEFORE BREAKFAST (Patient taking differently: Take 5 mg by mouth daily.) 30 tablet 3   HYDROcodone-acetaminophen (NORCO/VICODIN) 5-325 MG tablet Take 0.5-1 tablets by mouth every 8 (eight) hours as needed. 30 tablet 0   ipratropium (ATROVENT) 0.03 % nasal spray Place 2 sprays into the nose 2 (two) times daily. (Patient taking differently: Place 2 sprays into the nose 2 (two) times daily as needed for rhinitis.) 60 mL 3   lidocaine (LIDODERM) 5 % Place 1 patch onto the skin daily. Remove & Discard patch within 12 hours or as directed by MD 30 patch 0   melatonin 3 MG TABS tablet Take 1 tablet (3 mg total) by mouth at bedtime.  0   naloxone (NARCAN) nasal spray 4 mg/0.1 mL Use in case of excessive sedation from opoid pain medication 1 each PRN   nitroGLYCERIN (NITROLINGUAL) 0.4 MG/SPRAY spray Place 1 spray under the tongue every 5 (five) minutes x 3 doses as needed for chest pain. 12 g 5   polyethylene glycol (MIRALAX / GLYCOLAX) 17 g packet Take 17 g by mouth 2 (two) times daily. 14 each 0   QUEtiapine (SEROQUEL) 25 MG tablet Take 1 tablet (25 mg total) by mouth at bedtime. Can increase to 50 mg as directed by MD 60 tablet 1   senna-docusate (SENOKOT-S) 8.6-50 MG tablet Take 2 tablets by mouth 2 (two) times daily.     thyroid (ARMOUR THYROID) 120 MG tablet Take 1 tablet (120 mg total) by mouth daily. 90 tablet 3   No current facility-administered medications for this visit.    Allergies:   Ciprofloxacin, Codeine, Flexeril [cyclobenzaprine], Imdur [isosorbide nitrate], Methocarbamol, Other, Strawberry extract, Sulfa antibiotics, Sulfamethoxazole, Sulfonamide derivatives, Tomato, and Flagyl [metronidazole]   Social History:  The patient  reports that he quit smoking about 37 years ago. His smoking use included cigarettes. He has a 75.00 pack-year smoking history. He has never used smokeless tobacco. He reports current alcohol use of about 2.0 standard drinks of  alcohol per week. He reports that he does not use drugs.   Family History:  The patient's family history includes CAD in his child; Dementia in his sister; Hashimoto's thyroiditis in his child; Heart attack in his sister; Heart disease in his father, mother, and sister; Kidney failure in his father; Sjogren's syndrome in his child.    ROS:  Please see the history of present illness.   Otherwise, review of systems is positive for none.   All other systems are reviewed and negative.    PHYSICAL EXAM: VS:  There were no vitals taken for this visit. , BMI There is no height or weight on file to calculate BMI. GEN: Well nourished, well developed, in no acute distress  HEENT: normal  Neck: no JVD, carotid bruits, or masses Cardiac: ***RRR; no murmurs, rubs, or gallops,no edema  Respiratory:  clear to auscultation bilaterally, normal work of breathing GI: soft, nontender, nondistended, + BS MS: no deformity or atrophy  Skin: warm and dry, device pocket is well healed Neuro:  Strength and sensation are intact Psych: euthymic mood, full affect  EKG:  EKG {ACTION; IS/IS IRJ:[id number]} ordered today. Personal review of the ekg ordered *** shows ***  Device interrogation is reviewed today in detail.  See PaceArt for details.   Recent Labs: 08/13/2022: Magnesium 1.9 10/01/2022: ALT 20; BUN 19; Creatinine, Ser 2.03; Hemoglobin 13.9; Platelets 103.0; Potassium 4.7; Sodium 140; TSH 24.23    Lipid Panel     Component Value Date/Time   CHOL 162 10/13/2021 1419   CHOL 153 07/09/2017 0938   TRIG 292.0 (H) 10/13/2021 1419   HDL 29.80 (L) 10/13/2021 1419   HDL 38 (L) 07/09/2017 0938   CHOLHDL 5 10/13/2021 1419   VLDL 58.4 (H) 10/13/2021 1419   LDLCALC 77 09/14/2018 1117   LDLCALC 75 07/09/2017 0938   LDLDIRECT 102.0 10/13/2021 1419     Wt Readings from Last 3 Encounters:  08/20/22 206 lb 12.7 oz (93.8 kg)  02/13/22 208 lb (94.3 kg)  01/22/22 209 lb 12.8 oz (95.2 kg)      Other studies  Reviewed: Additional studies/ records that were reviewed today include: TTE 08/12/22  Review of the above records today demonstrates:   1. Left ventricular ejection fraction, by estimation, is 65 to 70%. The  left ventricle has normal function. The left ventricle has no regional  wall motion abnormalities. Left ventricular diastolic parameters are  indeterminate.   2. Right ventricular systolic function is normal. The right ventricular  size is normal. There is normal pulmonary artery systolic pressure.   3. The mitral valve is degenerative. No evidence of mitral valve  regurgitation. No evidence of mitral stenosis. Moderate to severe mitral  annular calcification.   4. The aortic valve is tricuspid. There is moderate calcification of the  aortic valve. There is moderate thickening of the aortic valve. Aortic  valve regurgitation is trivial. Moderate aortic valve stenosis. Aortic  valve area, by VTI measures 1.24 cm.  Aortic valve mean gradient measures 21.5 mmHg. Aortic valve Vmax measures  3.15 m/s.   5. The inferior vena cava is dilated in size with >50% respiratory  variability, suggesting right atrial pressure of 8 mmHg.    ASSESSMENT AND PLAN:  1.  Intermittent complete heart block: Status post Abbott dual-chamber pacemaker implanted 08/12/2022.  Device functioning appropriately.  Sensing, threshold, impedance within normal limits.  No changes.  2.  Mild to moderate aortic stenosis: Plan per primary cardiology  3.  Paroxysmal atrial fibrillation: Not on anticoagulation due to GI bleeds.  CHA2DS2-VASc at least 7.  4.  Coronary artery disease: Status post RCA stent.  Plan per primary cardiology.  Current medicines are reviewed at length with the patient today.   The patient {ACTIONS; HAS/DOES NOT HAVE:19233} concerns regarding his medicines.  The following changes were made today:  {NONE DEFAULTED:18576}  Labs/ tests ordered today include: *** No orders of the defined types  were placed in this encounter.    Disposition:   FU with Aryanne Gilleland {gen number [id number]} {Days to years:10300}  Signed, Kaliyah Gladman Jorja Loa, MD  11/16/2022 2:18 PM     [facility] [address] [place] [address] [phone] (office) [phone] (fax)

## 2022-11-17 ENCOUNTER — Encounter: Payer: Self-pay | Admitting: Cardiology

## 2022-11-25 MED ORDER — HYDROCODONE-ACETAMINOPHEN 5-325 MG PO TABS: 0.5000 | ORAL_TABLET | Freq: Three times a day (TID) | ORAL | 0 refills | Status: DC | PRN

## 2022-11-25 MED ORDER — QUETIAPINE FUMARATE 25 MG PO TABS: 75.0000 mg | ORAL_TABLET | Freq: Every day | ORAL | 1 refills | Status: DC

## 2022-11-25 NOTE — Addendum Note (Signed)
Addended by: Abbe Amsterdam C on: 11/25/2022 09:02 PM   Modules accepted: Orders

## 2022-12-02 NOTE — Progress Notes (Signed)
Remote pacemaker transmission.   

## 2022-12-08 ENCOUNTER — Other Ambulatory Visit: Payer: Self-pay | Admitting: Family Medicine

## 2022-12-08 ENCOUNTER — Telehealth: Payer: Self-pay | Admitting: *Deleted

## 2022-12-08 DIAGNOSIS — E119 Type 2 diabetes mellitus without complications: Secondary | ICD-10-CM

## 2022-12-08 NOTE — Telephone Encounter (Signed)
Rolly Pancake  You; [place], [place] M4 days ago   KT FYI     CORION GUINAN  P Cv Div [address] Scheduling (supporting Mychart, Generic)5 days ago    Augusto Bucko is in [place] staying with his daughter's family for care. We will call when he is able to return to [facility] and reschedule this appointment.  I'm sorry he missed his appointment; we weren't aware of it.     Mychart, Generic  Cristy Hilts Dewey3 weeks ago   GM Appointment Information:     Visit Type: PHYSICIAN PACER CHECK         Date: 11/16/2022                 [facility]                 Provider: Regan Lemming                 Time: 3:00 PM                 Length: 15 min   Appt Status: No Show        Cadence, Background  Alfonzia, Maitre [phone] weeks ago   Mychart, Generic  Aaron Malkin Dewey3 months ago   GM Appointment Information:     Visit Type: PHYSICIAN PACER CHECK         Date: 11/16/2022                 [facility]                 Provider: Regan Lemming                 Time: 3:00 PM                 Length: 15 min   Appt Status: Scheduled       Original Appointment Information:     Visit Type: PHYSICIAN PACER CHECK         Date: 10/14/2022                 [facility]                 Provider: Regan Lemming                 Time: 3:15 PM                 Length: 15 min       Cancel Reason: Provider

## 2022-12-08 NOTE — Telephone Encounter (Signed)
Spoke to wife. They expect to be returning end of next month and will make sure to schedule a visit when they return. She appreciates my follow up

## 2022-12-09 DIAGNOSIS — E119 Type 2 diabetes mellitus without complications: Secondary | ICD-10-CM

## 2022-12-09 MED ORDER — FARXIGA 5 MG PO TABS: 5.0000 mg | ORAL_TABLET | Freq: Every day | ORAL | 0 refills | Status: DC

## 2022-12-09 NOTE — Telephone Encounter (Signed)
Error

## 2022-12-27 ENCOUNTER — Other Ambulatory Visit: Payer: Self-pay | Admitting: Family Medicine

## 2022-12-27 DIAGNOSIS — E039 Hypothyroidism, unspecified: Secondary | ICD-10-CM

## 2023-02-04 ENCOUNTER — Telehealth: Payer: Self-pay | Admitting: Family Medicine

## 2023-02-04 NOTE — Telephone Encounter (Signed)
Copied from CRM [id number]. Topic: Medicare AWV >> Feb 04, 2023 10:06 AM Payton Doughty wrote: Reason for CRM: LM 02/04/2023 to schedule AWV   Verlee Rossetti; Care Guide Ambulatory Clinical Support [place] [facility] Direct Dial: [phone]

## 2023-02-11 ENCOUNTER — Ambulatory Visit (INDEPENDENT_AMBULATORY_CARE_PROVIDER_SITE_OTHER): Payer: Medicare Other

## 2023-02-11 DIAGNOSIS — I442 Atrioventricular block, complete: Secondary | ICD-10-CM

## 2023-02-11 LAB — CUP PACEART REMOTE DEVICE CHECK
Battery Remaining Longevity: 59 mo
Battery Remaining Percentage: 95 %
Battery Voltage: 2.98 V
Brady Statistic AP VP Percent: 1 %
Brady Statistic AP VS Percent: 1 %
Brady Statistic AS VP Percent: 99 %
Brady Statistic AS VS Percent: 1 %
Brady Statistic RA Percent Paced: 1 %
Brady Statistic RV Percent Paced: 99 %
Date Time Interrogation Session: 20240801053416
Implantable Lead Connection Status: 753985
Implantable Lead Connection Status: 753985
Implantable Lead Implant Date: 20240131
Implantable Lead Implant Date: 20240131
Implantable Lead Location: 753859
Implantable Lead Location: 753860
Implantable Pulse Generator Implant Date: 20240131
Lead Channel Impedance Value: 440 Ohm
Lead Channel Impedance Value: 460 Ohm
Lead Channel Pacing Threshold Amplitude: 0.5 V
Lead Channel Pacing Threshold Amplitude: 1 V
Lead Channel Pacing Threshold Pulse Width: 0.5 ms
Lead Channel Pacing Threshold Pulse Width: 0.5 ms
Lead Channel Sensing Intrinsic Amplitude: 5 mV
Lead Channel Sensing Intrinsic Amplitude: 9.7 mV
Lead Channel Setting Pacing Amplitude: 3.5 V
Lead Channel Setting Pacing Amplitude: 3.5 V
Lead Channel Setting Pacing Pulse Width: 0.5 ms
Lead Channel Setting Sensing Sensitivity: 4 mV
Pulse Gen Model: 2272
Pulse Gen Serial Number: 5680536

## 2023-02-18 ENCOUNTER — Ambulatory Visit (HOSPITAL_COMMUNITY): Admission: RE | Admit: 2023-02-18 | Payer: Medicare Other | Source: Ambulatory Visit

## 2023-02-23 ENCOUNTER — Ambulatory Visit (HOSPITAL_COMMUNITY)
Admission: RE | Admit: 2023-02-23 | Payer: Medicare Other | Source: Ambulatory Visit | Attending: Family Medicine | Admitting: Family Medicine

## 2023-02-23 NOTE — Progress Notes (Signed)
Remote pacemaker transmission.   

## 2023-03-30 ENCOUNTER — Other Ambulatory Visit: Payer: Self-pay | Admitting: Family Medicine

## 2023-03-30 DIAGNOSIS — E039 Hypothyroidism, unspecified: Secondary | ICD-10-CM

## 2023-05-13 ENCOUNTER — Ambulatory Visit (INDEPENDENT_AMBULATORY_CARE_PROVIDER_SITE_OTHER): Payer: Medicare Other

## 2023-05-13 DIAGNOSIS — I442 Atrioventricular block, complete: Secondary | ICD-10-CM

## 2023-05-13 LAB — CUP PACEART REMOTE DEVICE CHECK
Battery Remaining Longevity: 56 mo
Battery Remaining Percentage: 91 %
Battery Voltage: 2.98 V
Brady Statistic AP VP Percent: 1 %
Brady Statistic AP VS Percent: 1 %
Brady Statistic AS VP Percent: 99 %
Brady Statistic AS VS Percent: 1 %
Brady Statistic RA Percent Paced: 1 %
Brady Statistic RV Percent Paced: 99 %
Date Time Interrogation Session: 20241031041445
Implantable Lead Connection Status: 753985
Implantable Lead Connection Status: 753985
Implantable Lead Implant Date: 20240131
Implantable Lead Implant Date: 20240131
Implantable Lead Location: 753859
Implantable Lead Location: 753860
Implantable Pulse Generator Implant Date: 20240131
Lead Channel Impedance Value: 430 Ohm
Lead Channel Impedance Value: 440 Ohm
Lead Channel Pacing Threshold Amplitude: 0.5 V
Lead Channel Pacing Threshold Amplitude: 1 V
Lead Channel Pacing Threshold Pulse Width: 0.5 ms
Lead Channel Pacing Threshold Pulse Width: 0.5 ms
Lead Channel Sensing Intrinsic Amplitude: 12 mV
Lead Channel Sensing Intrinsic Amplitude: 5 mV
Lead Channel Setting Pacing Amplitude: 3.5 V
Lead Channel Setting Pacing Amplitude: 3.5 V
Lead Channel Setting Pacing Pulse Width: 0.5 ms
Lead Channel Setting Sensing Sensitivity: 4 mV
Pulse Gen Model: 2272
Pulse Gen Serial Number: 5680536

## 2023-05-26 ENCOUNTER — Other Ambulatory Visit: Payer: Self-pay | Admitting: Family Medicine

## 2023-05-26 DIAGNOSIS — E039 Hypothyroidism, unspecified: Secondary | ICD-10-CM

## 2023-05-26 DIAGNOSIS — E119 Type 2 diabetes mellitus without complications: Secondary | ICD-10-CM

## 2023-05-26 NOTE — Progress Notes (Signed)
Remote pacemaker transmission.   

## 2023-06-02 ENCOUNTER — Other Ambulatory Visit: Payer: Self-pay

## 2023-06-02 DIAGNOSIS — E039 Hypothyroidism, unspecified: Secondary | ICD-10-CM

## 2023-06-02 DIAGNOSIS — E119 Type 2 diabetes mellitus without complications: Secondary | ICD-10-CM

## 2023-06-02 MED ORDER — DAPAGLIFLOZIN PROPANEDIOL 5 MG PO TABS: 5.0000 mg | ORAL_TABLET | Freq: Every day | ORAL | 0 refills | Status: DC

## 2023-06-02 MED ORDER — THYROID 120 MG PO TABS: 120.0000 mg | ORAL_TABLET | Freq: Every day | ORAL | 0 refills | Status: DC

## 2023-06-27 ENCOUNTER — Other Ambulatory Visit: Payer: Self-pay | Admitting: Family Medicine

## 2023-06-27 DIAGNOSIS — E039 Hypothyroidism, unspecified: Secondary | ICD-10-CM

## 2023-07-19 ENCOUNTER — Emergency Department (HOSPITAL_COMMUNITY): Payer: Medicare Other

## 2023-07-19 ENCOUNTER — Inpatient Hospital Stay (HOSPITAL_COMMUNITY)
Admission: EM | Admit: 2023-07-19 | Discharge: 2023-07-21 | DRG: 193 | Disposition: A | Discharge status: Home / Self Care | Payer: Medicare Other | Attending: Internal Medicine | Admitting: Internal Medicine

## 2023-07-19 DIAGNOSIS — K219 Gastro-esophageal reflux disease without esophagitis: Secondary | ICD-10-CM | POA: Diagnosis present

## 2023-07-19 DIAGNOSIS — W19XXXA Unspecified fall, initial encounter: Principal | ICD-10-CM

## 2023-07-19 DIAGNOSIS — Z883 Allergy status to other anti-infective agents status: Secondary | ICD-10-CM

## 2023-07-19 DIAGNOSIS — Z9079 Acquired absence of other genital organ(s): Secondary | ICD-10-CM

## 2023-07-19 DIAGNOSIS — I129 Hypertensive chronic kidney disease with stage 1 through stage 4 chronic kidney disease, or unspecified chronic kidney disease: Secondary | ICD-10-CM | POA: Diagnosis present

## 2023-07-19 DIAGNOSIS — F05 Delirium due to known physiological condition: Secondary | ICD-10-CM | POA: Diagnosis not present

## 2023-07-19 DIAGNOSIS — E119 Type 2 diabetes mellitus without complications: Secondary | ICD-10-CM

## 2023-07-19 DIAGNOSIS — Z950 Presence of cardiac pacemaker: Secondary | ICD-10-CM | POA: Diagnosis not present

## 2023-07-19 DIAGNOSIS — Z888 Allergy status to other drugs, medicaments and biological substances status: Secondary | ICD-10-CM

## 2023-07-19 DIAGNOSIS — J45998 Other asthma: Secondary | ICD-10-CM | POA: Diagnosis present

## 2023-07-19 DIAGNOSIS — J101 Influenza due to other identified influenza virus with other respiratory manifestations: Secondary | ICD-10-CM | POA: Diagnosis not present

## 2023-07-19 DIAGNOSIS — G9341 Metabolic encephalopathy: Secondary | ICD-10-CM | POA: Diagnosis present

## 2023-07-19 DIAGNOSIS — N1832 Chronic kidney disease, stage 3b: Secondary | ICD-10-CM | POA: Diagnosis not present

## 2023-07-19 DIAGNOSIS — Z8249 Family history of ischemic heart disease and other diseases of the circulatory system: Secondary | ICD-10-CM

## 2023-07-19 DIAGNOSIS — Y9301 Activity, walking, marching and hiking: Secondary | ICD-10-CM | POA: Diagnosis present

## 2023-07-19 DIAGNOSIS — Z882 Allergy status to sulfonamides status: Secondary | ICD-10-CM

## 2023-07-19 DIAGNOSIS — Z885 Allergy status to narcotic agent status: Secondary | ICD-10-CM

## 2023-07-19 DIAGNOSIS — E1122 Type 2 diabetes mellitus with diabetic chronic kidney disease: Secondary | ICD-10-CM | POA: Diagnosis present

## 2023-07-19 DIAGNOSIS — I480 Paroxysmal atrial fibrillation: Secondary | ICD-10-CM | POA: Diagnosis present

## 2023-07-19 DIAGNOSIS — I350 Nonrheumatic aortic (valve) stenosis: Secondary | ICD-10-CM | POA: Diagnosis present

## 2023-07-19 DIAGNOSIS — E039 Hypothyroidism, unspecified: Secondary | ICD-10-CM | POA: Diagnosis present

## 2023-07-19 DIAGNOSIS — Z7984 Long term (current) use of oral hypoglycemic drugs: Secondary | ICD-10-CM

## 2023-07-19 DIAGNOSIS — Z8546 Personal history of malignant neoplasm of prostate: Secondary | ICD-10-CM

## 2023-07-19 DIAGNOSIS — F419 Anxiety disorder, unspecified: Secondary | ICD-10-CM | POA: Diagnosis present

## 2023-07-19 DIAGNOSIS — J111 Influenza due to unidentified influenza virus with other respiratory manifestations: Secondary | ICD-10-CM

## 2023-07-19 DIAGNOSIS — K7460 Unspecified cirrhosis of liver: Secondary | ICD-10-CM | POA: Diagnosis present

## 2023-07-19 DIAGNOSIS — Z87891 Personal history of nicotine dependence: Secondary | ICD-10-CM

## 2023-07-19 DIAGNOSIS — I2510 Atherosclerotic heart disease of native coronary artery without angina pectoris: Secondary | ICD-10-CM | POA: Diagnosis present

## 2023-07-19 DIAGNOSIS — D6959 Other secondary thrombocytopenia: Secondary | ICD-10-CM | POA: Diagnosis present

## 2023-07-19 DIAGNOSIS — R531 Weakness: Secondary | ICD-10-CM

## 2023-07-19 DIAGNOSIS — E785 Hyperlipidemia, unspecified: Secondary | ICD-10-CM | POA: Diagnosis present

## 2023-07-19 DIAGNOSIS — Z91018 Allergy to other foods: Secondary | ICD-10-CM

## 2023-07-19 DIAGNOSIS — Z9049 Acquired absence of other specified parts of digestive tract: Secondary | ICD-10-CM

## 2023-07-19 DIAGNOSIS — Z7982 Long term (current) use of aspirin: Secondary | ICD-10-CM

## 2023-07-19 DIAGNOSIS — Z1152 Encounter for screening for COVID-19: Secondary | ICD-10-CM

## 2023-07-19 DIAGNOSIS — E876 Hypokalemia: Secondary | ICD-10-CM | POA: Diagnosis present

## 2023-07-19 DIAGNOSIS — Z79890 Hormone replacement therapy (postmenopausal): Secondary | ICD-10-CM

## 2023-07-19 DIAGNOSIS — I442 Atrioventricular block, complete: Secondary | ICD-10-CM | POA: Diagnosis present

## 2023-07-19 DIAGNOSIS — Z955 Presence of coronary angioplasty implant and graft: Secondary | ICD-10-CM

## 2023-07-19 DIAGNOSIS — Y92002 Bathroom of unspecified non-institutional (private) residence single-family (private) house as the place of occurrence of the external cause: Secondary | ICD-10-CM

## 2023-07-19 DIAGNOSIS — Z8673 Personal history of transient ischemic attack (TIA), and cerebral infarction without residual deficits: Secondary | ICD-10-CM

## 2023-07-19 DIAGNOSIS — W010XXA Fall on same level from slipping, tripping and stumbling without subsequent striking against object, initial encounter: Secondary | ICD-10-CM | POA: Diagnosis present

## 2023-07-19 DIAGNOSIS — M1990 Unspecified osteoarthritis, unspecified site: Secondary | ICD-10-CM | POA: Diagnosis present

## 2023-07-19 DIAGNOSIS — Z8719 Personal history of other diseases of the digestive system: Secondary | ICD-10-CM

## 2023-07-19 DIAGNOSIS — Z881 Allergy status to other antibiotic agents status: Secondary | ICD-10-CM

## 2023-07-19 DIAGNOSIS — Z841 Family history of disorders of kidney and ureter: Secondary | ICD-10-CM

## 2023-07-19 DIAGNOSIS — Z79899 Other long term (current) drug therapy: Secondary | ICD-10-CM

## 2023-07-19 DIAGNOSIS — D696 Thrombocytopenia, unspecified: Secondary | ICD-10-CM | POA: Diagnosis present

## 2023-07-19 DIAGNOSIS — Z87442 Personal history of urinary calculi: Secondary | ICD-10-CM

## 2023-07-19 LAB — CBC WITH DIFFERENTIAL/PLATELET
Abs Immature Granulocytes: 0.1 10*3/uL — ABNORMAL HIGH (ref 0.00–0.07)
Band Neutrophils: 1 %
Basophils Absolute: 0 10*3/uL (ref 0.0–0.1)
Basophils Relative: 1 %
Eosinophils Absolute: 0.4 10*3/uL (ref 0.0–0.5)
Eosinophils Relative: 10 %
HCT: 39.7 % (ref 39.0–52.0)
Hemoglobin: 13.8 g/dL (ref 13.0–17.0)
Lymphocytes Relative: 8 %
Lymphs Abs: 0.3 10*3/uL — ABNORMAL LOW (ref 0.7–4.0)
MCH: 36.5 pg — ABNORMAL HIGH (ref 26.0–34.0)
MCHC: 34.8 g/dL (ref 30.0–36.0)
MCV: 105 fL — ABNORMAL HIGH (ref 80.0–100.0)
Metamyelocytes Relative: 1 %
Monocytes Absolute: 0.4 10*3/uL (ref 0.1–1.0)
Monocytes Relative: 11 %
Myelocytes: 2 %
Neutro Abs: 2.4 10*3/uL (ref 1.7–7.7)
Neutrophils Relative %: 66 %
Platelets: 82 10*3/uL — ABNORMAL LOW (ref 150–400)
RBC: 3.78 MIL/uL — ABNORMAL LOW (ref 4.22–5.81)
RDW: 14.4 % (ref 11.5–15.5)
WBC: 3.6 10*3/uL — ABNORMAL LOW (ref 4.0–10.5)
nRBC: 0 % (ref 0.0–0.2)

## 2023-07-19 LAB — COMPREHENSIVE METABOLIC PANEL
ALT: 18 U/L (ref 0–44)
AST: 36 U/L (ref 15–41)
Albumin: 2.8 g/dL — ABNORMAL LOW (ref 3.5–5.0)
Alkaline Phosphatase: 61 U/L (ref 38–126)
Anion gap: 10 (ref 5–15)
BUN: 17 mg/dL (ref 8–23)
CO2: 18 mmol/L — ABNORMAL LOW (ref 22–32)
Calcium: 7.1 mg/dL — ABNORMAL LOW (ref 8.9–10.3)
Chloride: 112 mmol/L — ABNORMAL HIGH (ref 98–111)
Creatinine, Ser: 1.33 mg/dL — ABNORMAL HIGH (ref 0.61–1.24)
GFR, Estimated: 52 mL/min — ABNORMAL LOW (ref >60)
Glucose, Bld: 89 mg/dL (ref 70–99)
Potassium: 3.2 mmol/L — ABNORMAL LOW (ref 3.5–5.1)
Sodium: 140 mmol/L (ref 135–145)
Total Bilirubin: 3.1 mg/dL — ABNORMAL HIGH (ref 0.0–1.2)
Total Protein: 5.5 g/dL — ABNORMAL LOW (ref 6.5–8.1)

## 2023-07-19 LAB — RESP PANEL BY RT-PCR (RSV, FLU A&B, COVID)  RVPGX2
Influenza A by PCR: POSITIVE — AB
Influenza B by PCR: NEGATIVE
Resp Syncytial Virus by PCR: NEGATIVE
SARS Coronavirus 2 by RT PCR: NEGATIVE

## 2023-07-19 LAB — CK: Total CK: 276 U/L (ref 49–397)

## 2023-07-19 LAB — MAGNESIUM: Magnesium: 1.6 mg/dL — ABNORMAL LOW (ref 1.7–2.4)

## 2023-07-19 LAB — AMMONIA: Ammonia: 14 umol/L (ref 9–35)

## 2023-07-19 MED ORDER — THYROID 60 MG PO TABS
120.0000 mg | ORAL_TABLET | Freq: Every day | ORAL | Status: DC
  Administered 2023-07-20 – 2023-07-21 (×2): 120 mg via ORAL
  Filled 2023-07-19 (×2): qty 2

## 2023-07-19 MED ORDER — ASPIRIN 81 MG PO TBEC
81.0000 mg | DELAYED_RELEASE_TABLET | Freq: Every day | ORAL | Status: DC
  Administered 2023-07-20 – 2023-07-21 (×2): 81 mg via ORAL
  Filled 2023-07-19 (×2): qty 1

## 2023-07-19 MED ORDER — POTASSIUM CHLORIDE 10 MEQ/100ML IV SOLN
10.0000 meq | INTRAVENOUS | Status: AC
  Administered 2023-07-19: 10 meq via INTRAVENOUS
  Filled 2023-07-19 (×4): qty 100

## 2023-07-19 MED ORDER — POTASSIUM CHLORIDE CRYS ER 20 MEQ PO TBCR
40.0000 meq | EXTENDED_RELEASE_TABLET | Freq: Once | ORAL | Status: DC
  Filled 2023-07-19: qty 2

## 2023-07-19 MED ORDER — ONDANSETRON HCL 4 MG PO TABS: 4.0000 mg | ORAL_TABLET | Freq: Four times a day (QID) | ORAL | Status: DC | PRN

## 2023-07-19 MED ORDER — ONDANSETRON HCL 4 MG/2ML IJ SOLN: 4.0000 mg | Freq: Four times a day (QID) | INTRAMUSCULAR | Status: DC | PRN

## 2023-07-19 MED ORDER — ACETAMINOPHEN 325 MG PO TABS: 650.0000 mg | ORAL_TABLET | Freq: Four times a day (QID) | ORAL | Status: DC | PRN

## 2023-07-19 MED ORDER — ACETAMINOPHEN 650 MG RE SUPP: 650.0000 mg | Freq: Four times a day (QID) | RECTAL | Status: DC | PRN

## 2023-07-19 MED ORDER — DAPAGLIFLOZIN PROPANEDIOL 5 MG PO TABS
5.0000 mg | ORAL_TABLET | Freq: Every day | ORAL | Status: DC
  Administered 2023-07-20 – 2023-07-21 (×2): 5 mg via ORAL
  Filled 2023-07-19 (×2): qty 1

## 2023-07-19 MED ORDER — MAGNESIUM SULFATE 2 GM/50ML IV SOLN
2.0000 g | Freq: Once | INTRAVENOUS | Status: AC
Start: 2023-07-19 — End: 2023-07-19
  Administered 2023-07-19: 2 g via INTRAVENOUS
  Filled 2023-07-19: qty 50

## 2023-07-19 MED ORDER — OSELTAMIVIR PHOSPHATE 30 MG PO CAPS
30.0000 mg | ORAL_CAPSULE | Freq: Two times a day (BID) | ORAL | Status: DC
  Administered 2023-07-19 – 2023-07-21 (×4): 30 mg via ORAL
  Filled 2023-07-19 (×5): qty 1

## 2023-07-19 NOTE — ED Notes (Signed)
 ED TO INPATIENT HANDOFF REPORT  ED Nurse Name and Phone #: Janeth [phone]  S Name/Age/Gender [name] 89 y.o. male Room/Bed: 035C/035C  Code Status   Code Status: Full Code  Home/SNF/Other Home Patient oriented to: self Is this baseline? Yes   Triage Complete: Triage complete  Chief Complaint Influenza A [J10.1]  Triage Note Pt arrives via ems to the er for the c/o fall today around 1215. Wife was with pt at home, couldn't get him up for around an hour. Pt was weak and knees buckled and fell. Pt did not hit head, not on blood thinners. Ems states pt feels hot, 28rr, 94% RA, 98% 2/L, BP 150/80, 118bg, 110p paced. EKG also will go in and out of some form of bigeminy paced rhythm. Pt has a hx of dementia. Mental status is not steady, altered at times, sometimes not altered. Has had a cough recently, wife thinks it is due to being around grandchildren during the holiday. Pt normally not on 02   Allergies Allergies  Allergen Reactions   Ciprofloxacin Other (See Comments)    Hallucinations or jitteriness   Codeine Other (See Comments)    Hallucinations    Flexeril [Cyclobenzaprine] Other (See Comments)    Made me feel goofy   Imdur  [Isosorbide  Nitrate] Other (See Comments)    Caused headaches   Methocarbamol Other (See Comments)    Made me feel goofy   Other Other (See Comments)    CANNOT HAVE ANY FOODS WITH SEEDS    Strawberry Extract Other (See Comments)    CANNOT HAVE ANY FOODS WITH SEEDS   Sulfa Antibiotics Other (See Comments)    Chills and shaking- serum sickness    Sulfamethoxazole Other (See Comments)    Chills and shaking- serum sickness   Sulfonamide Derivatives Other (See Comments)    Chills and shaking serum sickness   Tomato Other (See Comments)    CANNOT HAVE ANY FOODS WITH SEEDS   Flagyl [Metronidazole] Rash    Level of Care/Admitting Diagnosis ED Disposition     ED Disposition  Admit   Condition  --   Comment  Hospital Area:  [facility] [[id number]]  Level of Care: Telemetry Medical [104]  May place patient in observation at [facility] or [facility] if equivalent level of care is available:: No  Covid Evaluation: Confirmed COVID Negative  Diagnosis: Influenza A [723124]  Admitting Physician: LONZELL EMELINE HERO [[id number]]  Attending Physician: LONZELL EMELINE HERO [[id number]]          B Medical/Surgery History Past Medical History:  Diagnosis Date   Anginal pain (HCC)    Anxiety     OCCASIONAL   Arthritis    Asthma due to seasonal allergies    uses inhalers prn   Carotid artery occlusion    Left   Complication of anesthesia     had tremors after prostate surgery   Coronary artery disease    Diverticulitis    recurrent   Family history of anesthesia complication    MOTHER    GERD (gastroesophageal reflux disease)    H/O hiatal hernia    Hearing aid worn    Hyperlipemia    Hypertension    Kidney stone    lithotripsy 2011 w complications, req stents, Dr Nicholaus   Prostate CA [facility])    Shortness of breath    Thyroid  disease    TIA (transient ischemic attack)    Tinnitus    Past Surgical History:  Procedure  Laterality Date   CARDIAC CATHETERIZATION  2008   minimal dz, Dr Calhoun   CARDIAC CATHETERIZATION N/A 10/09/2015   Procedure: Left Heart Cath and Coronary Angiography;  Surgeon: Peter M Jordan, MD;  Location: [facility] INVASIVE CV LAB;  Service: Cardiovascular;  Laterality: N/A;   CARDIAC CATHETERIZATION N/A 10/09/2015   Procedure: Intravascular Pressure Wire/FFR Study;  Surgeon: Peter M Jordan, MD;  Location: [facility] INVASIVE CV LAB;  Service: Cardiovascular;  Laterality: N/A;   CARDIAC CATHETERIZATION N/A 10/09/2015   Procedure: Coronary Stent Intervention;  Surgeon: Peter M Jordan, MD;  Location: [facility] INVASIVE CV LAB;  Service: Cardiovascular;  Laterality: N/A;   CAROTID ENDARTERECTOMY  Left   1998   CHOLECYSTECTOMY     CORONARY STENT PLACEMENT  10/09/2015   DES to RCA    ESOPHAGOGASTRODUODENOSCOPY (EGD) WITH PROPOFOL  Left 03/26/2017   Procedure: ESOPHAGOGASTRODUODENOSCOPY (EGD) WITH PROPOFOL ;  Surgeon: Burnette Fallow, MD;  Location: [facility] ENDOSCOPY;  Service: Endoscopy;  Laterality: Left;   LEFT HEART CATHETERIZATION WITH CORONARY ANGIOGRAM N/A 09/15/2013   Procedure: LEFT HEART CATHETERIZATION WITH CORONARY ANGIOGRAM;  Surgeon: Peter M Jordan, MD;  Location: [facility] CATH LAB;  Service: Cardiovascular;  Laterality: N/A;   PACEMAKER IMPLANT N/A 08/12/2022   Procedure: PACEMAKER IMPLANT;  Surgeon: Inocencio Soyla Lunger, MD;  Location: [facility] INVASIVE CV LAB;  Service: Cardiovascular;  Laterality: N/A;   PROSTATECTOMY  1998   radical for prostate cancer   TEMPORARY PACEMAKER N/A 08/11/2022   Procedure: TEMPORARY PACEMAKER;  Surgeon: Ladona Heinz, MD;  Location: [facility] INVASIVE CV LAB;  Service: Cardiovascular;  Laterality: N/A;     A IV Location/Drains/Wounds Patient Lines/Drains/Airways Status     Active Line/Drains/Airways     Name Placement date Placement time Site Days   Peripheral IV 07/19/23 18 G Anterior;Left Forearm 07/19/23  --  Forearm  less than 1            Intake/Output Last 24 hours No intake or output data in the 24 hours ending 07/19/23 2027  Labs/Imaging Results for orders placed or performed during the hospital encounter of 07/19/23 (from the past 48 hours)  Resp panel by RT-PCR (RSV, Flu A&B, Covid) Anterior Nasal Swab     Status: Abnormal   Collection Time: 07/19/23  4:43 PM   Specimen: Anterior Nasal Swab  Result Value Ref Range   SARS Coronavirus 2 by RT PCR NEGATIVE NEGATIVE   Influenza A by PCR POSITIVE (A) NEGATIVE   Influenza B by PCR NEGATIVE NEGATIVE    Comment: (NOTE) The Xpert Xpress SARS-CoV-2/FLU/RSV plus assay is intended as an aid in the diagnosis of influenza from Nasopharyngeal swab specimens and should not be used as a sole basis for treatment. Nasal washings and aspirates are unacceptable for Xpert Xpress  SARS-CoV-2/FLU/RSV testing.  Fact Sheet for Patients: bloggercourse.com  Fact Sheet for Healthcare Providers: seriousbroker.it  This test is not yet approved or cleared by the [place]  FDA and has been authorized for detection and/or diagnosis of SARS-CoV-2 by FDA under an Emergency Use Authorization (EUA). This EUA will remain in effect (meaning this test can be used) for the duration of the COVID-19 declaration under Section 564(b)(1) of the Act, 21 U.S.C. section 360bbb-3(b)(1), unless the authorization is terminated or revoked.     Resp Syncytial Virus by PCR NEGATIVE NEGATIVE    Comment: (NOTE) Fact Sheet for Patients: bloggercourse.com  Fact Sheet for Healthcare Providers: seriousbroker.it  This test is not yet approved or cleared by the [place]  FDA and has been authorized for  detection and/or diagnosis of SARS-CoV-2 by FDA under an Emergency Use Authorization (EUA). This EUA will remain in effect (meaning this test can be used) for the duration of the COVID-19 declaration under Section 564(b)(1) of the Act, 21 U.S.C. section 360bbb-3(b)(1), unless the authorization is terminated or revoked.  Performed at [facility], [address] [place], [place] [address]   Ammonia     Status: None   Collection Time: 07/19/23  5:06 PM  Result Value Ref Range   Ammonia 14 9 - 35 umol/L    Comment: Performed at [facility], [address] [place], [place] [address]  CK     Status: None   Collection Time: 07/19/23  5:09 PM  Result Value Ref Range   Total CK 276 49 - 397 U/L    Comment: Performed at [facility], [address], [place], [place] [address]  CBC with Differential     Status: Abnormal   Collection Time: 07/19/23  5:09 PM  Result Value Ref Range   WBC 3.6 (L) 4.0 - 10.5 K/uL   RBC 3.78 (L) 4.22 - 5.81 MIL/uL   Hemoglobin 13.8 13.0 -  17.0 g/dL   HCT 60.2 60.9 - 47.9 %   MCV 105.0 (H) 80.0 - 100.0 fL   MCH 36.5 (H) 26.0 - 34.0 pg   MCHC 34.8 30.0 - 36.0 g/dL   RDW 85.5 88.4 - 84.4 %   Platelets 82 (L) 150 - 400 K/uL    Comment: Immature Platelet Fraction may be clinically indicated, consider ordering this additional test [id number] REPEATED TO VERIFY PLATELET COUNT CONFIRMED BY SMEAR    nRBC 0.0 0.0 - 0.2 %   Neutrophils Relative % 66 %   Neutro Abs 2.4 1.7 - 7.7 K/uL   Band Neutrophils 1 %   Lymphocytes Relative 8 %   Lymphs Abs 0.3 (L) 0.7 - 4.0 K/uL   Monocytes Relative 11 %   Monocytes Absolute 0.4 0.1 - 1.0 K/uL   Eosinophils Relative 10 %   Eosinophils Absolute 0.4 0.0 - 0.5 K/uL   Basophils Relative 1 %   Basophils Absolute 0.0 0.0 - 0.1 K/uL   WBC Morphology Mild Left Shift (1-5% metas, occ myelo)     Comment: VACUOLATED NEUTROPHILS   RBC Morphology MORPHOLOGY UNREMARKABLE    Smear Review MORPHOLOGY UNREMARKABLE    Metamyelocytes Relative 1 %   Myelocytes 2 %   Abs Immature Granulocytes 0.10 (H) 0.00 - 0.07 K/uL    Comment: Performed at [facility], [address], [place], [place] [address]  Comprehensive metabolic panel     Status: Abnormal   Collection Time: 07/19/23  5:09 PM  Result Value Ref Range   Sodium 140 135 - 145 mmol/L   Potassium 3.2 (L) 3.5 - 5.1 mmol/L   Chloride 112 (H) 98 - 111 mmol/L   CO2 18 (L) 22 - 32 mmol/L   Glucose, Bld 89 70 - 99 mg/dL    Comment: Glucose reference range applies only to samples taken after fasting for at least 8 hours.   BUN 17 8 - 23 mg/dL   Creatinine, Ser 8.66 (H) 0.61 - 1.24 mg/dL   Calcium  7.1 (L) 8.9 - 10.3 mg/dL   Total Protein 5.5 (L) 6.5 - 8.1 g/dL   Albumin 2.8 (L) 3.5 - 5.0 g/dL   AST 36 15 - 41 U/L   ALT 18 0 - 44 U/L   Alkaline Phosphatase 61 38 -  126 U/L   Total Bilirubin 3.1 (H) 0.0 - 1.2 mg/dL   GFR, Estimated 52 (L) >60 mL/min    Comment: (NOTE) Calculated using the CKD-EPI Creatinine Equation (2021)    Anion gap 10 5 -  15    Comment: Performed at [facility], [address], [place], [place] [address]  Magnesium      Status: Abnormal   Collection Time: 07/19/23  5:09 PM  Result Value Ref Range   Magnesium  1.6 (L) 1.7 - 2.4 mg/dL    Comment: Performed at [facility], [address], [place], [place] [address]   DG Pelvis 1-2 Views Result Date: 07/19/2023 CLINICAL DATA:  Pain after fall EXAM: PELVIS - 1 VIEW COMPARISON:  X-ray 08/10/2022 and CT scan. FINDINGS: Osteopenia. No fracture or dislocation. Preserved hip joints and pubic symphysis. Mild degenerative changes of the sacroiliac joints. Hyperostosis. Surgical changes in the low pelvis. IMPRESSION: Mild degenerative changes along the sacroiliac joints. Electronically Signed   By: Ranell Bring M.D.   On: 07/19/2023 17:55   DG Chest 2 View Result Date: 07/19/2023 CLINICAL DATA:  Cough and shortness of breath EXAM: CHEST - 2 VIEW COMPARISON:  X-ray 08/13/2022. FINDINGS: Enlarged cardiopericardial silhouette with vascular congestion. Interstitial prominence again seen. Prominent calcifications in the area of the mitral valve annulus. Left upper chest pacemaker. No consolidation, pneumothorax or effusion. Film is under penetrated. Diffuse degenerative changes of the spine. There is compression deformity along vertebral bodies in the thoracolumbar junction, unchanged from previous. IMPRESSION: Enlarged heart with a pacemaker and vascular congestion. Compression deformity of a vertebral body at the thoracolumbar junction as on prior, possibly L1. Electronically Signed   By: Ranell Bring M.D.   On: 07/19/2023 17:54    Pending Labs Unresulted Labs (From admission, onward)     Start     Ordered   07/20/23 0500  CBC  Tomorrow morning,   R        07/19/23 1949   07/20/23 0500  Comprehensive metabolic panel  Tomorrow morning,   R        07/19/23 1949   07/19/23 1709  Pathologist smear review  Once,   R        07/19/23 1709   07/19/23 1643  Urinalysis,  Routine w reflex microscopic -Urine, Clean Catch  Once,   URGENT       Question:  Specimen Source  Answer:  Urine, Clean Catch   07/19/23 1643            Vitals/Pain Today's Vitals   07/19/23 1602 07/19/23 1816  BP: (!) 156/73 (!) 142/71  Pulse: (!) 107 (!) 106  Resp: (!) 31 19  Temp: 97.8 F (36.6 C) (!) 100.4 F (38 C)  TempSrc:  Oral  SpO2: 97% 97%    Isolation Precautions No active isolations  Medications Medications  magnesium  sulfate IVPB 2 g 50 mL (2 g Intravenous New Bag/Given 07/19/23 1950)  oseltamivir  (TAMIFLU ) capsule 30 mg (has no administration in time range)  acetaminophen  (TYLENOL ) tablet 650 mg (has no administration in time range)    Or  acetaminophen  (TYLENOL ) suppository 650 mg (has no administration in time range)  ondansetron  (ZOFRAN ) tablet 4 mg (has no administration in time range)    Or  ondansetron  (ZOFRAN ) injection 4 mg (has no administration in time range)  potassium chloride  10 mEq in 100 mL IVPB (has no administration in time range)    Mobility Walks at home, unknown if able to  ambulate after fall     Focused Assessments Pulmonary Assessment Handoff:  Lung sounds: Bilateral Breath Sounds: Diminished, Expiratory wheezes L Breath Sounds: Diminished, Expiratory wheezes R Breath Sounds: Diminished, Expiratory wheezes O2 Device: Room Air      R Recommendations: See Admitting Provider Note  Report given to:   Additional Notes:

## 2023-07-19 NOTE — ED Provider Notes (Signed)
 [facility] AT [facility] Provider Note   CSN: [id number] Arrival date & time: 07/19/23  1539     History  Chief Complaint  Patient presents with   [name] is a 89 y.o. male.  89 year old male with a history of CAD status post PCI, atrial fibrillation not on anticoagulation, heart block status post Abbott pacemaker prostate cancer, hypertension, hyperlipidemia, carotid endarterectomy, sensory hearing loss, TIA, L1 fracture with routine healing, and reported dementia who presents to the emergency department with weakness and fall.  Wife reports that over the past few weeks he has been having a dry cough.  Has started to become more confused recently as well.  Typically is alert and oriented x 3 and is ambulatory and able to handle all of his own ADLs.  Says that today he was getting up from going to the bathroom and was attempting to walk when his legs got weak and he slowly fell to the ground.  Says that he was laying on his right side.  No significant head strike or LOC.  Not been complaining of pain anywhere since then.  Was on the ground for approximately 2 hours.  They are concerned about a possible pneumonia but would like to take the patient home today if possible.       Home Medications Prior to Admission medications   Medication Sig Start Date End Date Taking? Authorizing Provider  ARMOUR THYROID  120 MG tablet TAKE 1 TABLET (120 MG TOTAL) BY MOUTH DAILY BEFORE BREAKFAST. Patient taking differently: Take 120 mg by mouth daily before breakfast. 06/28/23  Yes Copland, Harlene BROCKS, MD  aspirin  EC 81 MG tablet Take 81 mg by mouth daily.   Yes [provider]  dapagliflozin  propanediol (FARXIGA ) 5 MG TABS tablet Take 1 tablet (5 mg total) by mouth daily. 06/02/23  Yes Copland, Harlene BROCKS, MD  IBUPROFEN PO Take 2 tablets by mouth daily as needed.   Yes [provider]  blood glucose meter kit and supplies KIT Dispense based on  patient and insurance preference. Use up to four times daily as directed. 09/10/20   Vann, Jessica U, DO      Allergies    Ciprofloxacin, Codeine, Flexeril [cyclobenzaprine], Imdur  [isosorbide  nitrate], Methocarbamol, Other, Strawberry extract, Sulfa antibiotics, Sulfamethoxazole, Sulfonamide derivatives, Tomato, and Flagyl [metronidazole]    Review of Systems   Review of Systems  Physical Exam Updated Vital Signs BP (!) 150/74 (BP Location: Right Arm)   Pulse 89   Temp 98.1 F (36.7 C) (Oral)   Resp 19   SpO2 98%  Physical Exam Constitutional:      General: He is not in acute distress.    Appearance: Normal appearance. He is not ill-appearing.  HENT:     Head: Normocephalic and atraumatic.     Right Ear: External ear normal.     Left Ear: External ear normal.     Mouth/Throat:     Mouth: Mucous membranes are moist.     Pharynx: Oropharynx is clear.  Eyes:     Extraocular Movements: Extraocular movements intact.     Conjunctiva/sclera: Conjunctivae normal.     Pupils: Pupils are equal, round, and reactive to light.  Neck:     Comments: No C-spine midline tenderness to palpation Cardiovascular:     Rate and Rhythm: Normal rate and regular rhythm.     Pulses: Normal pulses.     Heart sounds: Normal heart sounds.  Pulmonary:  Effort: Pulmonary effort is normal. No respiratory distress.     Breath sounds: Normal breath sounds.     Comments: Limited due to patient's not taking deep breaths Abdominal:     General: Abdomen is flat. There is no distension.     Palpations: Abdomen is soft. There is no mass.     Tenderness: There is no abdominal tenderness. There is no guarding.  Musculoskeletal:        General: No deformity. Normal range of motion.     Cervical back: No rigidity or tenderness.     Comments: Midline lumbar spine tenderness to palpation which is chronic per his wife.  No step-offs palpated.  No tenderness to palpation of chest wall.  No bruising noted.  No  tenderness to palpation of bilateral clavicles.  No tenderness to palpation, bruising, or deformities noted of bilateral shoulders, elbows, wrists, hips, knees, or ankles.  Neurological:     General: No focal deficit present.     Mental Status: He is alert.     Cranial Nerves: No cranial nerve deficit.     Sensory: No sensory deficit.     Motor: No weakness.     Comments: Alert oriented to self only     ED Results / Procedures / Treatments   Labs (all labs ordered are listed, but only abnormal results are displayed) Labs Reviewed  RESP PANEL BY RT-PCR (RSV, FLU A&B, COVID)  RVPGX2 - Abnormal; Notable for the following components:      Result Value   Influenza A by PCR POSITIVE (*)    All other components within normal limits  CBC WITH DIFFERENTIAL/PLATELET - Abnormal; Notable for the following components:   WBC 3.6 (*)    RBC 3.78 (*)    MCV 105.0 (*)    MCH 36.5 (*)    Platelets 82 (*)    Lymphs Abs 0.3 (*)    Abs Immature Granulocytes 0.10 (*)    All other components within normal limits  COMPREHENSIVE METABOLIC PANEL - Abnormal; Notable for the following components:   Potassium 3.2 (*)    Chloride 112 (*)    CO2 18 (*)    Creatinine, Ser 1.33 (*)    Calcium  7.1 (*)    Total Protein 5.5 (*)    Albumin 2.8 (*)    Total Bilirubin 3.1 (*)    GFR, Estimated 52 (*)    All other components within normal limits  MAGNESIUM  - Abnormal; Notable for the following components:   Magnesium  1.6 (*)    All other components within normal limits  CBC - Abnormal; Notable for the following components:   RBC 3.80 (*)    MCV 103.2 (*)    MCH 36.1 (*)    Platelets 78 (*)    All other components within normal limits  COMPREHENSIVE METABOLIC PANEL - Abnormal; Notable for the following components:   Glucose, Bld 115 (*)    Creatinine, Ser 1.72 (*)    Albumin 3.4 (*)    AST 59 (*)    Total Bilirubin 3.5 (*)    GFR, Estimated 38 (*)    All other components within normal limits   MAGNESIUM  - Abnormal; Notable for the following components:   Magnesium  2.5 (*)    All other components within normal limits  CK  AMMONIA  PATHOLOGIST SMEAR REVIEW  TSH  URINALYSIS, ROUTINE W REFLEX MICROSCOPIC    EKG EKG Interpretation Date/Time:  Monday July 19 2023 16:01:36 EST Ventricular Rate:  107  PR Interval:  161 QRS Duration:  142 QT Interval:  373 QTC Calculation: 498 R Axis:   -71  Text Interpretation: Atrially sensed ventricularly paced tachycardia Left bundle branch block Confirmed by Yolande Charleston [phone]) on 07/19/2023 5:14:37 PM  Radiology DG Pelvis 1-2 Views Result Date: 07/19/2023 CLINICAL DATA:  Pain after fall EXAM: PELVIS - 1 VIEW COMPARISON:  X-ray 08/10/2022 and CT scan. FINDINGS: Osteopenia. No fracture or dislocation. Preserved hip joints and pubic symphysis. Mild degenerative changes of the sacroiliac joints. Hyperostosis. Surgical changes in the low pelvis. IMPRESSION: Mild degenerative changes along the sacroiliac joints. Electronically Signed   By: Ranell Bring M.D.   On: 07/19/2023 17:55   DG Chest 2 View Result Date: 07/19/2023 CLINICAL DATA:  Cough and shortness of breath EXAM: CHEST - 2 VIEW COMPARISON:  X-ray 08/13/2022. FINDINGS: Enlarged cardiopericardial silhouette with vascular congestion. Interstitial prominence again seen. Prominent calcifications in the area of the mitral valve annulus. Left upper chest pacemaker. No consolidation, pneumothorax or effusion. Film is under penetrated. Diffuse degenerative changes of the spine. There is compression deformity along vertebral bodies in the thoracolumbar junction, unchanged from previous. IMPRESSION: Enlarged heart with a pacemaker and vascular congestion. Compression deformity of a vertebral body at the thoracolumbar junction as on prior, possibly L1. Electronically Signed   By: Ranell Bring M.D.   On: 07/19/2023 17:54    Procedures Procedures    Medications Ordered in ED Medications   oseltamivir  (TAMIFLU ) capsule 30 mg (30 mg Oral Given 07/20/23 1122)  acetaminophen  (TYLENOL ) tablet 650 mg (has no administration in time range)    Or  acetaminophen  (TYLENOL ) suppository 650 mg (has no administration in time range)  ondansetron  (ZOFRAN ) tablet 4 mg (has no administration in time range)    Or  ondansetron  (ZOFRAN ) injection 4 mg (has no administration in time range)  potassium chloride  10 mEq in 100 mL IVPB (0 mEq Intravenous Stopped 07/19/23 2330)  aspirin  EC tablet 81 mg (81 mg Oral Given 07/20/23 1122)  dapagliflozin  propanediol (FARXIGA ) tablet 5 mg (5 mg Oral Given 07/20/23 1122)  thyroid  (ARMOUR) tablet 120 mg (120 mg Oral Given 07/20/23 1122)  perflutren  lipid microspheres (DEFINITY ) IV suspension (1 mL Intravenous Given 07/20/23 1409)  magnesium  sulfate IVPB 2 g 50 mL (0 g Intravenous Stopped 07/19/23 2054)  potassium chloride  10 mEq in 100 mL IVPB (10 mEq Intravenous New Bag/Given 07/20/23 0338)  sodium chloride  0.9 % bolus 500 mL (500 mLs Intravenous New Bag/Given 07/20/23 1126)    ED Course/ Medical Decision Making/ A&P Clinical Course as of 07/20/23 1443  Mon Jul 19, 2023  1829 Influenza A By PCR(!): POSITIVE [RP]  1837 Platelets(!): 82 Baseline 100 [RP]  1837 Creatinine(!): 1.33 At baseline [RP]  1941 Called by [employer] about the patient's pacemaker interrogation.  Has had several runs in November and December of SVT.  Has been 100% paced.  They noticed high output and think the output may need to be turned down. [RP]    Clinical Course User Index [RP] Yolande Charleston BROCKS, MD                                 Medical Decision Making Amount and/or Complexity of Data Reviewed Labs: ordered. Decision-making details documented in ED Course. Radiology: ordered.  Risk Prescription drug management. Decision regarding hospitalization.   KEIJI MELLAND is a 89 y.o. male with comorbidities that complicate the patient evaluation including CAD  status post PCI, atrial  fibrillation not on anticoagulation, heart block status post Abbott pacemaker prostate cancer, hypertension, hyperlipidemia, carotid endarterectomy, sensory hearing loss, TIA, L1 fracture with routine healing, and reported dementia who presents to the emergency department with weakness and fall.     Initial Ddx:  URI, pneumonia, CHF exacerbation, arrhythmia, MI, stroke, delirium/dementia, rhabdo  MDM/Course:  Patient presents to the emergency department with URI symptoms and cough.  Did appear to have a fall today as well.  No head strike or LOC that would warrant head or C-spine imaging.  On exam is overall very fatigued appearing but not an extremis.  No focal neurologic deficits that would suggest stroke.  Chest and pelvis x-rays without acute abnormalities.  Was found to have influenza.  CK WNL so feel that rhabdomyolysis highly unlikely.  Based on the patient's renal function was given reduced dose of Tamiflu  and with his weakness and pneumonia was admitted to the hospital since family is very concerned about him falling again if he goes home and does appear to be very weak.  Upon re-evaluation still feeling weak but no other new complaints and is satting well on room air.  Did have his pacemaker interrogated as well and cardiology will come see him to make adjustments since it appears to be having high output.  This patient presents to the ED for concern of complaints listed in HPI, this involves an extensive number of treatment options, and is a complaint that carries with it a high risk of complications and morbidity. Disposition including potential need for admission considered.   Dispo: Admit to Floor  Additional history obtained from spouse Records reviewed Outpatient Clinic Notes The following labs were independently interpreted: Chemistry and show CKD I independently reviewed the following imaging with scope of interpretation limited to determining acute life threatening conditions  related to emergency care: Chest x-ray and agree with the radiologist interpretation with the following exceptions: none I personally reviewed and interpreted cardiac monitoring:  Paced rhythm I personally reviewed and interpreted the pt's EKG: see above for interpretation  I have reviewed the patients home medications and made adjustments as needed Consults: Hospitalist Social Determinants of health:  Elderly  Portions of this note were generated with Scientist, clinical (histocompatibility and immunogenetics). Dictation errors may occur despite best attempts at proofreading.     Final Clinical Impression(s) / ED Diagnoses Final diagnoses:  Fall, initial encounter  Influenza  Generalized weakness    Rx / DC Orders ED Discharge Orders     None         Yolande Lamar BROCKS, MD 07/20/23 1443

## 2023-07-19 NOTE — Assessment & Plan Note (Addendum)
 Chronic thrombocytopenia due to cirrhosis based on prior notes. Follow CBC daily while here

## 2023-07-19 NOTE — Assessment & Plan Note (Signed)
Creat 1.3 today, looks to be his baseline.

## 2023-07-19 NOTE — ED Triage Notes (Addendum)
 Pt arrives via ems to the er for the c/o fall today around 1215. Wife was with pt at home, couldn't get him up for around an hour. Pt was weak and knees buckled and fell. Pt did not hit head, not on blood thinners. Ems states pt feels hot, 28rr, 94% RA, 98% 2/L, BP 150/80, 118bg, 110p paced. EKG also will go in and out of some form of bigeminy paced rhythm. Pt has a hx of dementia. Mental status is not steady, altered at times, sometimes not altered. Has had a cough recently, wife thinks it is due to being around grandchildren during the holiday. Pt normally not on 02

## 2023-07-19 NOTE — Consult Note (Addendum)
 Cardiology Consultation   Patient ID: [name] MRN: [id number]; DOB: 1934/05/01  Admit date: 07/19/2023 Date of Consult: 07/19/2023  PCP:  Watt Harlene BROCKS, MD   [facility] Providers Cardiologist:  Peter Jordan, MD  Electrophysiologist:  Soyla Gladis Norton, MD       Patient Profile:   [name] is a 89 y.o. male with a hx of CAD status post RCA PCI in 2017, complete heart block status post PPM 08/12/2022, prostate cancer, hypertension, carotid artery disease, paroxysmal atrial fibrillation not on anticoagulation, GI bleeding, aortic stenosis who is being seen 07/19/2023 for the evaluation of pacemaker abnormalities at the request of Dr Yolande.  History of Present Illness:   [name] is a 89 year old male with a history of CAD status post RCA PCI in 2017, complete heart block status post PPM 08/12/2022, prostate cancer, hypertension, carotid artery disease, paroxysmal atrial fibrillation not on anticoagulation, GI bleeding, aortic stenosis who were consulted for concern for pacemaker abnormalities.  He presented to ED today after a fall.  States that around noon his legs felt weak and his knees buckled and he fell.  His wife was with him but could not get him up for about an hour.  On presentation to the ED, noted to be febrile to 100.4, BP 142/71, SpO2 97% on room air.  Vitals notable for creatinine 1.33, potassium 3.2, magnesium  1.6, hemoglobin 13.8, influenza A +.  EKG shows atrial sensed, ventricular paced rhythm, rate 107.  Chest x-ray showed vascular congestion.  Device was interrogated in the ED (report unavailable) but per ED provider in discussion with [employer] rep there was concern for high output and pacemaker settings may need adjusted.  Past Medical History:  Diagnosis Date   Anginal pain (HCC)    Anxiety     OCCASIONAL   Arthritis    Asthma due to seasonal allergies    uses inhalers prn   Carotid artery occlusion    Left   Complication of anesthesia      had tremors after prostate surgery   Coronary artery disease    Diverticulitis    recurrent   Family history of anesthesia complication    MOTHER    GERD (gastroesophageal reflux disease)    H/O hiatal hernia    Hearing aid worn    Hyperlipemia    Hypertension    Kidney stone    lithotripsy 2011 w complications, req stents, Dr Nicholaus   Prostate CA [facility])    Shortness of breath    Thyroid  disease    TIA (transient ischemic attack)    Tinnitus     Past Surgical History:  Procedure Laterality Date   CARDIAC CATHETERIZATION  2008   minimal dz, Dr Calhoun   CARDIAC CATHETERIZATION N/A 10/09/2015   Procedure: Left Heart Cath and Coronary Angiography;  Surgeon: Peter M Jordan, MD;  Location: [facility] INVASIVE CV LAB;  Service: Cardiovascular;  Laterality: N/A;   CARDIAC CATHETERIZATION N/A 10/09/2015   Procedure: Intravascular Pressure Wire/FFR Study;  Surgeon: Peter M Jordan, MD;  Location: [facility] INVASIVE CV LAB;  Service: Cardiovascular;  Laterality: N/A;   CARDIAC CATHETERIZATION N/A 10/09/2015   Procedure: Coronary Stent Intervention;  Surgeon: Peter M Jordan, MD;  Location: [facility] INVASIVE CV LAB;  Service: Cardiovascular;  Laterality: N/A;   CAROTID ENDARTERECTOMY  Left   1998   CHOLECYSTECTOMY     CORONARY STENT PLACEMENT  10/09/2015   DES to RCA   ESOPHAGOGASTRODUODENOSCOPY (EGD) WITH PROPOFOL  Left 03/26/2017  Procedure: ESOPHAGOGASTRODUODENOSCOPY (EGD) WITH PROPOFOL ;  Surgeon: Burnette Fallow, MD;  Location: [facility] ENDOSCOPY;  Service: Endoscopy;  Laterality: Left;   LEFT HEART CATHETERIZATION WITH CORONARY ANGIOGRAM N/A 09/15/2013   Procedure: LEFT HEART CATHETERIZATION WITH CORONARY ANGIOGRAM;  Surgeon: Peter M Jordan, MD;  Location: [facility] CATH LAB;  Service: Cardiovascular;  Laterality: N/A;   PACEMAKER IMPLANT N/A 08/12/2022   Procedure: PACEMAKER IMPLANT;  Surgeon: Inocencio Soyla Lunger, MD;  Location: [facility] INVASIVE CV LAB;  Service: Cardiovascular;  Laterality: N/A;   PROSTATECTOMY  1998    radical for prostate cancer   TEMPORARY PACEMAKER N/A 08/11/2022   Procedure: TEMPORARY PACEMAKER;  Surgeon: Ladona Heinz, MD;  Location: [facility] INVASIVE CV LAB;  Service: Cardiovascular;  Laterality: N/A;     Inpatient Medications: Scheduled Meds:  oseltamivir   30 mg Oral BID   Continuous Infusions:  magnesium  sulfate bolus IVPB 2 g (07/19/23 1950)   potassium chloride      PRN Meds: acetaminophen  **OR** acetaminophen , ondansetron  **OR** ondansetron  (ZOFRAN ) IV  Allergies:    Allergies  Allergen Reactions   Ciprofloxacin Other (See Comments)    Hallucinations or jitteriness   Codeine Other (See Comments)    Hallucinations    Flexeril [Cyclobenzaprine] Other (See Comments)    Made me feel goofy   Imdur  [Isosorbide  Nitrate] Other (See Comments)    Caused headaches   Methocarbamol Other (See Comments)    Made me feel goofy   Other Other (See Comments)    CANNOT HAVE ANY FOODS WITH SEEDS    Strawberry Extract Other (See Comments)    CANNOT HAVE ANY FOODS WITH SEEDS   Sulfa Antibiotics Other (See Comments)    Chills and shaking- serum sickness    Sulfamethoxazole Other (See Comments)    Chills and shaking- serum sickness   Sulfonamide Derivatives Other (See Comments)    Chills and shaking serum sickness   Tomato Other (See Comments)    CANNOT HAVE ANY FOODS WITH SEEDS   Flagyl [Metronidazole] Rash    Social History:   Social History   Socioeconomic History   Marital status: Married    Spouse name: Not on file   Number of children: Not on file   Years of education: Not on file   Highest education level: Not on file  Occupational History   Occupation: retired    Comment: charity fundraiser [employer]  Tobacco Use   Smoking status: Former    Current packs/day: 0.00    Average packs/day: 2.5 packs/day for 30.0 years (75.0 ttl pk-yrs)    Types: Cigarettes    Start date: 08/29/1955    Quit date: 08/28/1985    Years since quitting: 37.9   Smokeless tobacco:  Never  Substance and Sexual Activity   Alcohol  use: Yes    Alcohol /week: 2.0 standard drinks of alcohol     Types: 2 Standard drinks or equivalent per week    Comment: 1 glass wine/week (prior 1 glass martini with dinner)   Drug use: No   Sexual activity: Yes    Birth control/protection: None  Other Topics Concern   Not on file  Social History Narrative   Lives with wife--recently diagnosed with breast ca; No smoking; Occassional wine; Retired; 2 daughters live in [place]--5 grandchildren(boys). On weight watchers. Lives in [place] with wife. Retired animal nutritionist. Tobacco history 2 ppd x 32 years. No smoking x 25 years. No drugs.   Social Drivers of Health   Financial Resource Strain: Low Risk  (09/18/2019)   Overall Financial Resource  Strain (CARDIA)    Difficulty of Paying Living Expenses: Not hard at all  Food Insecurity: No Food Insecurity (08/11/2022)   Hunger Vital Sign    Worried About Running Out of Food in the Last Year: Never true    Ran Out of Food in the Last Year: Never true  Transportation Needs: No Transportation Needs (08/11/2022)   PRAPARE - Administrator, Civil Service (Medical): No    Lack of Transportation (Non-Medical): No  Physical Activity: Inactive (01/12/2022)   Exercise Vital Sign    Days of Exercise per Week: 0 days    Minutes of Exercise per Session: 0 min  Stress: No Stress Concern Present (01/12/2022)   [employer] of Occupational Health - Occupational Stress Questionnaire    Feeling of Stress : Not at all  Social Connections: Moderately Isolated (01/12/2022)   Social Connection and Isolation Panel [NHANES]    Frequency of Communication with Friends and Family: Three times a week    Frequency of Social Gatherings with Friends and Family: More than three times a week    Attends Religious Services: Never    Database Administrator or Organizations: No    Attends Banker Meetings: Never    Marital Status: Married   Catering Manager Violence: Not At Risk (08/11/2022)   Humiliation, Afraid, Rape, and Kick questionnaire    Fear of Current or Ex-Partner: No    Emotionally Abused: No    Physically Abused: No    Sexually Abused: No    Family History:    Family History  Problem Relation Age of Onset   Heart disease Mother    Heart disease Father    Kidney failure Father    Heart disease Sister    Heart attack Sister    Dementia Sister    Sjogren's syndrome Child    Hashimoto's thyroiditis Child    CAD Child      ROS:  Please see the history of present illness.   All other ROS reviewed and negative.     Physical Exam/Data:   Vitals:   07/19/23 1602 07/19/23 1816  BP: (!) 156/73 (!) 142/71  Pulse: (!) 107 (!) 106  Resp: (!) 31 19  Temp: 97.8 F (36.6 C) (!) 100.4 F (38 C)  TempSrc:  Oral  SpO2: 97% 97%   No intake or output data in the 24 hours ending 07/19/23 2025    10/01/2022    2:27 PM 08/20/2022    3:00 AM 08/19/2022    5:00 AM  Last 3 Weights  Weight (lbs) -- 206 lb 12.7 oz 194 lb 10.7 oz  Weight (kg) -- 93.8 kg 88.3 kg     There is no height or weight on file to calculate BMI.  General: Chronically ill-appearing HEENT: normal Neck: no JVD Cardiac: Regular rhythm, tachycardic, 3/6 systolic murmur Lungs: Expiratory wheezing Abd: soft, nontender Ext: no edema Musculoskeletal:  No deformities Skin: warm and dry  Neuro: Oriented to person only Psych: Unable to assess  EKG:  The EKG was personally reviewed and demonstrates: Atrial sensed, ventricular paced rhythm, rate 107 Telemetry:  Telemetry was personally reviewed and demonstrates: Atrial sensed, ventricular paced rhythm with rate in 100s  Relevant CV Studies:   Laboratory Data:  High Sensitivity Troponin:  No results for input(s): TROPONINIHS in the last 720 hours.   Chemistry Recent Labs  Lab 07/19/23 1709  NA 140  K 3.2*  CL 112*  CO2 18*  GLUCOSE 89  BUN 17  CREATININE 1.33*  CALCIUM  7.1*  MG  1.6*  GFRNONAA 52*  ANIONGAP 10    Recent Labs  Lab 07/19/23 1709  PROT 5.5*  ALBUMIN 2.8*  AST 36  ALT 18  ALKPHOS 61  BILITOT 3.1*   Lipids No results for input(s): CHOL, TRIG, HDL, LABVLDL, LDLCALC, CHOLHDL in the last 168 hours.  Hematology Recent Labs  Lab 07/19/23 1709  WBC 3.6*  RBC 3.78*  HGB 13.8  HCT 39.7  MCV 105.0*  MCH 36.5*  MCHC 34.8  RDW 14.4  PLT 82*   Thyroid  No results for input(s): TSH, FREET4 in the last 168 hours.  BNPNo results for input(s): BNP, PROBNP in the last 168 hours.  DDimer No results for input(s): DDIMER in the last 168 hours.   Radiology/Studies:  DG Pelvis 1-2 Views Result Date: 07/19/2023 CLINICAL DATA:  Pain after fall EXAM: PELVIS - 1 VIEW COMPARISON:  X-ray 08/10/2022 and CT scan. FINDINGS: Osteopenia. No fracture or dislocation. Preserved hip joints and pubic symphysis. Mild degenerative changes of the sacroiliac joints. Hyperostosis. Surgical changes in the low pelvis. IMPRESSION: Mild degenerative changes along the sacroiliac joints. Electronically Signed   By: Ranell Bring M.D.   On: 07/19/2023 17:55   DG Chest 2 View Result Date: 07/19/2023 CLINICAL DATA:  Cough and shortness of breath EXAM: CHEST - 2 VIEW COMPARISON:  X-ray 08/13/2022. FINDINGS: Enlarged cardiopericardial silhouette with vascular congestion. Interstitial prominence again seen. Prominent calcifications in the area of the mitral valve annulus. Left upper chest pacemaker. No consolidation, pneumothorax or effusion. Film is under penetrated. Diffuse degenerative changes of the spine. There is compression deformity along vertebral bodies in the thoracolumbar junction, unchanged from previous. IMPRESSION: Enlarged heart with a pacemaker and vascular congestion. Compression deformity of a vertebral body at the thoracolumbar junction as on prior, possibly L1. Electronically Signed   By: Ranell Bring M.D.   On: 07/19/2023 17:54     Assessment and  Plan:   Pacemaker abnormality: Device was interrogated in the ED (report unavailable) but per ED provider in discussion with [employer] rep there was concern for high output and pacemaker settings may need adjusted.  On review of telemetry, pacemaker appears to be functioning appropriately, he is in atrial sensed, ventricular paced rhythm with rate in 100s, appropriately tachycardic given his acute illness.  Unclear issue- may have increased pacing thresholds due to electrolyte abnormalities in setting of his acute illness.  Can discuss further with device rep tomorrow and obtain interrogation report.  In meantime, would replete for K>4, Mag>2  Fall: Occurred in setting of weakness due to flu, no reported loss of consciousness (patient unable to provide history).  No significant arrhythmias reportedly on device interrogation.  Will update echocardiogram  Aortic stenosis: Moderate AS on echo 07/2022.  Will update echo  Paroxysmal atrial fibrillation: Not on anticoagulation given GI bleeding history.  Currently in A-sensed V-paced rhythm  CAD: h/o RCA stenting in 2017.  Continue ASA.  Intolerant to statins  Influenza A: Treatment per primary team   For questions or updates, please contact [facility] Please consult [email or web address] for contact info under    Signed, Zachary LITTIE Nanas, MD  07/19/2023 8:25 PM

## 2023-07-19 NOTE — Assessment & Plan Note (Addendum)
 Not on DAOC due to prior GI bleeds.

## 2023-07-19 NOTE — H&P (Signed)
 History and Physical    Patient: [name] FMW:[id number] DOB: 26-Oct-1933 DOA: 07/19/2023 DOS: the patient was seen and examined on 07/19/2023 PCP: Copland, Harlene BROCKS, MD  Patient coming from: Home  Chief Complaint:  Chief Complaint  Patient presents with   Fall   HPI: [name] is a 89 y.o. male with medical history significant of CAD s/p PCI, PPM, HTN, HLD, CEA, TIA, PAF not on AC due to h/o GIBs.  Pt normally AAOx3 and ambulatory at baseline.  Wife reports: Pt with dry cough recently.  Has started to become more confused recently as well.  Seemed to onset after grandchildren visited him for holidays.  Today he was getting up from going to the bathroom and was attempting to walk when his legs got weak and he slowly fell to the ground.  Laying on R side.  No significant head injury nor LOC.  Review of Systems: As mentioned in the history of present illness. All other systems reviewed and are negative. Past Medical History:  Diagnosis Date   Anginal pain (HCC)    Anxiety     OCCASIONAL   Arthritis    Asthma due to seasonal allergies    uses inhalers prn   Carotid artery occlusion    Left   Complication of anesthesia     had tremors after prostate surgery   Coronary artery disease    Diverticulitis    recurrent   Family history of anesthesia complication    MOTHER    GERD (gastroesophageal reflux disease)    H/O hiatal hernia    Hearing aid worn    Hyperlipemia    Hypertension    Kidney stone    lithotripsy 2011 w complications, req stents, Dr Nicholaus   Prostate CA [facility])    Shortness of breath    Thyroid  disease    TIA (transient ischemic attack)    Tinnitus    Past Surgical History:  Procedure Laterality Date   CARDIAC CATHETERIZATION  2008   minimal dz, Dr Calhoun   CARDIAC CATHETERIZATION N/A 10/09/2015   Procedure: Left Heart Cath and Coronary Angiography;  Surgeon: Peter M Jordan, MD;  Location: [facility] INVASIVE CV LAB;  Service: Cardiovascular;  Laterality:  N/A;   CARDIAC CATHETERIZATION N/A 10/09/2015   Procedure: Intravascular Pressure Wire/FFR Study;  Surgeon: Peter M Jordan, MD;  Location: [facility] INVASIVE CV LAB;  Service: Cardiovascular;  Laterality: N/A;   CARDIAC CATHETERIZATION N/A 10/09/2015   Procedure: Coronary Stent Intervention;  Surgeon: Peter M Jordan, MD;  Location: [facility] INVASIVE CV LAB;  Service: Cardiovascular;  Laterality: N/A;   CAROTID ENDARTERECTOMY  Left   1998   CHOLECYSTECTOMY     CORONARY STENT PLACEMENT  10/09/2015   DES to RCA   ESOPHAGOGASTRODUODENOSCOPY (EGD) WITH PROPOFOL  Left 03/26/2017   Procedure: ESOPHAGOGASTRODUODENOSCOPY (EGD) WITH PROPOFOL ;  Surgeon: Burnette Fallow, MD;  Location: [facility] ENDOSCOPY;  Service: Endoscopy;  Laterality: Left;   LEFT HEART CATHETERIZATION WITH CORONARY ANGIOGRAM N/A 09/15/2013   Procedure: LEFT HEART CATHETERIZATION WITH CORONARY ANGIOGRAM;  Surgeon: Peter M Jordan, MD;  Location: [facility] CATH LAB;  Service: Cardiovascular;  Laterality: N/A;   PACEMAKER IMPLANT N/A 08/12/2022   Procedure: PACEMAKER IMPLANT;  Surgeon: Inocencio Soyla Lunger, MD;  Location: [facility] INVASIVE CV LAB;  Service: Cardiovascular;  Laterality: N/A;   PROSTATECTOMY  1998   radical for prostate cancer   TEMPORARY PACEMAKER N/A 08/11/2022   Procedure: TEMPORARY PACEMAKER;  Surgeon: Ladona Heinz, MD;  Location: [facility] INVASIVE CV LAB;  Service: Cardiovascular;  Laterality: N/A;   Social History:  reports that he quit smoking about 37 years ago. His smoking use included cigarettes. He started smoking about 67 years ago. He has a 75 pack-year smoking history. He has never used smokeless tobacco. He reports current alcohol  use of about 2.0 standard drinks of alcohol  per week. He reports that he does not use drugs.  Allergies  Allergen Reactions   Ciprofloxacin Other (See Comments)    Hallucinations or jitteriness   Codeine Other (See Comments)    Hallucinations    Flexeril [Cyclobenzaprine] Other (See Comments)    Made me feel goofy    Imdur  [Isosorbide  Nitrate] Other (See Comments)    Caused headaches   Methocarbamol Other (See Comments)    Made me feel goofy   Other Other (See Comments)    CANNOT HAVE ANY FOODS WITH SEEDS    Strawberry Extract Other (See Comments)    CANNOT HAVE ANY FOODS WITH SEEDS   Sulfa Antibiotics Other (See Comments)    Chills and shaking- serum sickness    Sulfamethoxazole Other (See Comments)    Chills and shaking- serum sickness   Sulfonamide Derivatives Other (See Comments)    Chills and shaking serum sickness   Tomato Other (See Comments)    CANNOT HAVE ANY FOODS WITH SEEDS   Flagyl [Metronidazole] Rash    Family History  Problem Relation Age of Onset   Heart disease Mother    Heart disease Father    Kidney failure Father    Heart disease Sister    Heart attack Sister    Dementia Sister    Sjogren's syndrome Child    Hashimoto's thyroiditis Child    CAD Child     Prior to Admission medications   Medication Sig Start Date End Date Taking? Authorizing Provider  ARMOUR THYROID  120 MG tablet TAKE 1 TABLET (120 MG TOTAL) BY MOUTH DAILY BEFORE BREAKFAST. Patient taking differently: Take 120 mg by mouth daily before breakfast. 06/28/23   Copland, Harlene BROCKS, MD  aspirin  EC 81 MG tablet Take 81 mg by mouth daily.    [provider]  blood glucose meter kit and supplies KIT Dispense based on patient and insurance preference. Use up to four times daily as directed. 09/10/20   Vann, Jessica U, DO  camphor-menthol  [facility]) lotion Apply topically as needed for itching. 08/18/22   Krishnan, Gokul, MD  dapagliflozin  propanediol (FARXIGA ) 5 MG TABS tablet Take 1 tablet (5 mg total) by mouth daily. 06/02/23   Copland, Harlene BROCKS, MD  HYDROcodone -acetaminophen  (NORCO/VICODIN) 5-325 MG tablet Take 0.5-1 tablets by mouth every 8 (eight) hours as needed. 11/25/22   Copland, Jessica C, MD  ipratropium (ATROVENT ) 0.03 % nasal spray Place 2 sprays into the nose 2 (two) times daily. Patient  taking differently: Place 2 sprays into the nose 2 (two) times daily as needed for rhinitis. 03/20/22   Copland, Harlene BROCKS, MD  lidocaine  (LIDODERM ) 5 % Place 1 patch onto the skin daily. Remove & Discard patch within 12 hours or as directed by MD 08/18/22   Krishnan, Gokul, MD  melatonin 3 MG TABS tablet Take 1 tablet (3 mg total) by mouth at bedtime. 08/18/22   Krishnan, Gokul, MD  naloxone  (NARCAN ) nasal spray 4 mg/0.1 mL Use in case of excessive sedation from opoid pain medication 09/14/22   Copland, Jessica C, MD  nitroGLYCERIN  (NITROLINGUAL ) 0.4 MG/SPRAY spray Place 1 spray under the tongue every 5 (five) minutes x 3 doses as needed  for chest pain. 07/18/18   Jordan, Peter M, MD  polyethylene glycol (MIRALAX  / GLYCOLAX ) 17 g packet Take 17 g by mouth 2 (two) times daily. 08/18/22   Krishnan, Gokul, MD  QUEtiapine  (SEROQUEL ) 25 MG tablet Take 3 tablets (75 mg total) by mouth at bedtime. Can increase to 50 mg as directed by MD 11/25/22   Copland, Harlene BROCKS, MD  senna-docusate (SENOKOT-S) 8.6-50 MG tablet Take 2 tablets by mouth 2 (two) times daily. 08/18/22   Verdene Purchase, MD    Physical Exam: Vitals:   07/19/23 1602 07/19/23 1816  BP: (!) 156/73 (!) 142/71  Pulse: (!) 107 (!) 106  Resp: (!) 31 19  Temp: 97.8 F (36.6 C) (!) 100.4 F (38 C)  TempSrc:  Oral  SpO2: 97% 97%   Constitutional: NAD, calm, comfortable Respiratory: clear to auscultation bilaterally, no wheezing, no crackles. Normal respiratory effort. No accessory muscle use.  Cardiovascular: Tachycardic Abdomen: no tenderness, no masses palpated. No hepatosplenomegaly. Bowel sounds positive.  Neurologic: CN 2-12 grossly intact. Sensation intact, DTR normal. Strength 5/5 in all 4.  Psychiatric: Oriented to self only  Data Reviewed:    Labs on Admission: I have personally reviewed following labs and imaging studies  CBC: Recent Labs  Lab 07/19/23 1709  WBC 3.6*  NEUTROABS 2.4  HGB 13.8  HCT 39.7  MCV 105.0*  PLT 82*    Basic Metabolic Panel: Recent Labs  Lab 07/19/23 1709  NA 140  K 3.2*  CL 112*  CO2 18*  GLUCOSE 89  BUN 17  CREATININE 1.33*  CALCIUM  7.1*  MG 1.6*   GFR: CrCl cannot be calculated (Unknown ideal weight.). Liver Function Tests: Recent Labs  Lab 07/19/23 1709  AST 36  ALT 18  ALKPHOS 61  BILITOT 3.1*  PROT 5.5*  ALBUMIN 2.8*   No results for input(s): LIPASE, AMYLASE in the last 168 hours. Recent Labs  Lab 07/19/23 1706  AMMONIA 14   Coagulation Profile: No results for input(s): INR, PROTIME in the last 168 hours. Cardiac Enzymes: Recent Labs  Lab 07/19/23 1709  CKTOTAL 276   BNP (last 3 results) No results for input(s): PROBNP in the last 8760 hours. HbA1C: No results for input(s): HGBA1C in the last 72 hours. CBG: No results for input(s): GLUCAP in the last 168 hours. Lipid Profile: No results for input(s): CHOL, HDL, LDLCALC, TRIG, CHOLHDL, LDLDIRECT in the last 72 hours. Thyroid  Function Tests: No results for input(s): TSH, T4TOTAL, FREET4, T3FREE, THYROIDAB in the last 72 hours. Anemia Panel: No results for input(s): VITAMINB12, FOLATE, FERRITIN, TIBC, IRON, RETICCTPCT in the last 72 hours. Urine analysis:    Component Value Date/Time   COLORURINE YELLOW 08/11/2022 0103   APPEARANCEUR CLEAR 08/11/2022 0103   LABSPEC 1.030 08/11/2022 0103   PHURINE 6.0 08/11/2022 0103   GLUCOSEU >=500 (A) 08/11/2022 0103   HGBUR SMALL (A) 08/11/2022 0103   BILIRUBINUR NEGATIVE 08/11/2022 0103   BILIRUBINUR negative 01/22/2022 1536   BILIRUBINUR negative 01/30/2013 0948   KETONESUR 5 (A) 08/11/2022 0103   PROTEINUR NEGATIVE 08/11/2022 0103   UROBILINOGEN 0.2 01/22/2022 1536   UROBILINOGEN 0.2 03/24/2015 0525   NITRITE NEGATIVE 08/11/2022 0103   LEUKOCYTESUR NEGATIVE 08/11/2022 0103    Radiological Exams on Admission: DG Pelvis 1-2 Views Result Date: 07/19/2023 CLINICAL DATA:  Pain after fall EXAM: PELVIS -  1 VIEW COMPARISON:  X-ray 08/10/2022 and CT scan. FINDINGS: Osteopenia. No fracture or dislocation. Preserved hip joints and pubic symphysis. Mild degenerative changes of the  sacroiliac joints. Hyperostosis. Surgical changes in the low pelvis. IMPRESSION: Mild degenerative changes along the sacroiliac joints. Electronically Signed   By: Ranell Bring M.D.   On: 07/19/2023 17:55   DG Chest 2 View Result Date: 07/19/2023 CLINICAL DATA:  Cough and shortness of breath EXAM: CHEST - 2 VIEW COMPARISON:  X-ray 08/13/2022. FINDINGS: Enlarged cardiopericardial silhouette with vascular congestion. Interstitial prominence again seen. Prominent calcifications in the area of the mitral valve annulus. Left upper chest pacemaker. No consolidation, pneumothorax or effusion. Film is under penetrated. Diffuse degenerative changes of the spine. There is compression deformity along vertebral bodies in the thoracolumbar junction, unchanged from previous. IMPRESSION: Enlarged heart with a pacemaker and vascular congestion. Compression deformity of a vertebral body at the thoracolumbar junction as on prior, possibly L1. Electronically Signed   By: Ranell Bring M.D.   On: 07/19/2023 17:54    EKG: Independently reviewed.   Assessment and Plan: * Influenza A Tamiflu  Tylenol  PRN fever Delirium precautions  Thrombocytopenia (HCC) Chronic thrombocytopenia due to cirrhosis based on prior notes. Follow CBC daily while here  DM2 (diabetes mellitus, type 2) (HCC) Med rec pending  AF (paroxysmal atrial fibrillation) (HCC) Not on DAOC due to prior GI bleeds.  Chronic kidney disease, stage 3b (HCC) Creat 1.3 today, looks to be his baseline.      Advance Care Planning:   Code Status: Full Code  Consults: None  Family Communication: No family in room  Severity of Illness: The appropriate patient status for this patient is OBSERVATION. Observation status is judged to be reasonable and necessary in order to provide the  required intensity of service to ensure the patient's safety. The patient's presenting symptoms, physical exam findings, and initial radiographic and laboratory data in the context of their medical condition is felt to place them at decreased risk for further clinical deterioration. Furthermore, it is anticipated that the patient will be medically stable for discharge from the hospital within 2 midnights of admission.   Author: Savaughn Karwowski M., DO 07/19/2023 7:58 PM  For on call review [email or web address].

## 2023-07-19 NOTE — ED Notes (Signed)
 Verbal report given over phone to 6N.

## 2023-07-19 NOTE — Assessment & Plan Note (Addendum)
 Tamiflu Tylenol PRN fever Delirium precautions

## 2023-07-19 NOTE — Assessment & Plan Note (Signed)
Med rec pending ?

## 2023-07-19 NOTE — Assessment & Plan Note (Signed)
 Cards consulting to adjust, apparently pacemaker output needs to be turned down.

## 2023-07-20 ENCOUNTER — Observation Stay (HOSPITAL_COMMUNITY): Payer: Medicare Other

## 2023-07-20 DIAGNOSIS — E1122 Type 2 diabetes mellitus with diabetic chronic kidney disease: Secondary | ICD-10-CM | POA: Diagnosis present

## 2023-07-20 DIAGNOSIS — D6959 Other secondary thrombocytopenia: Secondary | ICD-10-CM | POA: Diagnosis present

## 2023-07-20 DIAGNOSIS — E039 Hypothyroidism, unspecified: Secondary | ICD-10-CM | POA: Diagnosis present

## 2023-07-20 DIAGNOSIS — I350 Nonrheumatic aortic (valve) stenosis: Secondary | ICD-10-CM

## 2023-07-20 DIAGNOSIS — Z955 Presence of coronary angioplasty implant and graft: Secondary | ICD-10-CM | POA: Diagnosis not present

## 2023-07-20 DIAGNOSIS — Z881 Allergy status to other antibiotic agents status: Secondary | ICD-10-CM | POA: Diagnosis not present

## 2023-07-20 DIAGNOSIS — Z8249 Family history of ischemic heart disease and other diseases of the circulatory system: Secondary | ICD-10-CM | POA: Diagnosis not present

## 2023-07-20 DIAGNOSIS — Z1152 Encounter for screening for COVID-19: Secondary | ICD-10-CM | POA: Diagnosis not present

## 2023-07-20 DIAGNOSIS — R531 Weakness: Secondary | ICD-10-CM | POA: Diagnosis present

## 2023-07-20 DIAGNOSIS — Y9301 Activity, walking, marching and hiking: Secondary | ICD-10-CM | POA: Diagnosis present

## 2023-07-20 DIAGNOSIS — J101 Influenza due to other identified influenza virus with other respiratory manifestations: Secondary | ICD-10-CM | POA: Diagnosis present

## 2023-07-20 DIAGNOSIS — F05 Delirium due to known physiological condition: Secondary | ICD-10-CM | POA: Diagnosis not present

## 2023-07-20 DIAGNOSIS — Y92002 Bathroom of unspecified non-institutional (private) residence single-family (private) house as the place of occurrence of the external cause: Secondary | ICD-10-CM | POA: Diagnosis not present

## 2023-07-20 DIAGNOSIS — G9341 Metabolic encephalopathy: Secondary | ICD-10-CM | POA: Diagnosis present

## 2023-07-20 DIAGNOSIS — W010XXA Fall on same level from slipping, tripping and stumbling without subsequent striking against object, initial encounter: Secondary | ICD-10-CM | POA: Diagnosis present

## 2023-07-20 DIAGNOSIS — I129 Hypertensive chronic kidney disease with stage 1 through stage 4 chronic kidney disease, or unspecified chronic kidney disease: Secondary | ICD-10-CM | POA: Diagnosis present

## 2023-07-20 DIAGNOSIS — I2510 Atherosclerotic heart disease of native coronary artery without angina pectoris: Secondary | ICD-10-CM | POA: Diagnosis present

## 2023-07-20 DIAGNOSIS — Z87891 Personal history of nicotine dependence: Secondary | ICD-10-CM | POA: Diagnosis not present

## 2023-07-20 DIAGNOSIS — E785 Hyperlipidemia, unspecified: Secondary | ICD-10-CM | POA: Diagnosis present

## 2023-07-20 DIAGNOSIS — I480 Paroxysmal atrial fibrillation: Secondary | ICD-10-CM | POA: Diagnosis present

## 2023-07-20 DIAGNOSIS — I442 Atrioventricular block, complete: Secondary | ICD-10-CM | POA: Diagnosis present

## 2023-07-20 DIAGNOSIS — Z8673 Personal history of transient ischemic attack (TIA), and cerebral infarction without residual deficits: Secondary | ICD-10-CM | POA: Diagnosis not present

## 2023-07-20 DIAGNOSIS — Z8546 Personal history of malignant neoplasm of prostate: Secondary | ICD-10-CM | POA: Diagnosis not present

## 2023-07-20 DIAGNOSIS — K7460 Unspecified cirrhosis of liver: Secondary | ICD-10-CM | POA: Diagnosis present

## 2023-07-20 DIAGNOSIS — J45998 Other asthma: Secondary | ICD-10-CM | POA: Diagnosis present

## 2023-07-20 DIAGNOSIS — N1832 Chronic kidney disease, stage 3b: Secondary | ICD-10-CM | POA: Diagnosis present

## 2023-07-20 DIAGNOSIS — E876 Hypokalemia: Secondary | ICD-10-CM | POA: Diagnosis present

## 2023-07-20 LAB — COMPREHENSIVE METABOLIC PANEL
ALT: 27 U/L (ref 0–44)
AST: 59 U/L — ABNORMAL HIGH (ref 15–41)
Albumin: 3.4 g/dL — ABNORMAL LOW (ref 3.5–5.0)
Alkaline Phosphatase: 80 U/L (ref 38–126)
Anion gap: 11 (ref 5–15)
BUN: 22 mg/dL (ref 8–23)
CO2: 24 mmol/L (ref 22–32)
Calcium: 8.9 mg/dL (ref 8.9–10.3)
Chloride: 103 mmol/L (ref 98–111)
Creatinine, Ser: 1.72 mg/dL — ABNORMAL HIGH (ref 0.61–1.24)
GFR, Estimated: 38 mL/min — ABNORMAL LOW (ref >60)
Glucose, Bld: 115 mg/dL — ABNORMAL HIGH (ref 70–99)
Potassium: 4.4 mmol/L (ref 3.5–5.1)
Sodium: 138 mmol/L (ref 135–145)
Total Bilirubin: 3.5 mg/dL — ABNORMAL HIGH (ref 0.0–1.2)
Total Protein: 7 g/dL (ref 6.5–8.1)

## 2023-07-20 LAB — ECHOCARDIOGRAM COMPLETE
AR max vel: 1.25 cm2
AV Area VTI: 1.08 cm2
AV Area mean vel: 1.17 cm2
AV Mean grad: 17 mm[Hg]
AV Peak grad: 28.5 mm[Hg]
Ao pk vel: 2.67 m/s
Area-P 1/2: 5.97 cm2
Calc EF: 59.1 %
MV VTI: 1.95 cm2
S' Lateral: 2.5 cm
Single Plane A2C EF: 53.1 %
Single Plane A4C EF: 61.4 %

## 2023-07-20 LAB — MAGNESIUM: Magnesium: 2.5 mg/dL — ABNORMAL HIGH (ref 1.7–2.4)

## 2023-07-20 LAB — CBC
HCT: 39.2 % (ref 39.0–52.0)
Hemoglobin: 13.7 g/dL (ref 13.0–17.0)
MCH: 36.1 pg — ABNORMAL HIGH (ref 26.0–34.0)
MCHC: 34.9 g/dL (ref 30.0–36.0)
MCV: 103.2 fL — ABNORMAL HIGH (ref 80.0–100.0)
Platelets: 78 10*3/uL — ABNORMAL LOW (ref 150–400)
RBC: 3.8 MIL/uL — ABNORMAL LOW (ref 4.22–5.81)
RDW: 14.4 % (ref 11.5–15.5)
WBC: 4.1 10*3/uL (ref 4.0–10.5)
nRBC: 0 % (ref 0.0–0.2)

## 2023-07-20 LAB — PATHOLOGIST SMEAR REVIEW

## 2023-07-20 LAB — TSH: TSH: 1.153 u[IU]/mL (ref 0.350–4.500)

## 2023-07-20 MED ORDER — GUAIFENESIN-DM 100-10 MG/5ML PO SYRP
10.0000 mL | ORAL_SOLUTION | ORAL | Status: DC | PRN
  Administered 2023-07-20: 10 mL via ORAL
  Filled 2023-07-20: qty 10

## 2023-07-20 MED ORDER — GUAIFENESIN ER 600 MG PO TB12
1200.0000 mg | ORAL_TABLET | Freq: Two times a day (BID) | ORAL | Status: DC
  Administered 2023-07-20 – 2023-07-21 (×2): 1200 mg via ORAL
  Filled 2023-07-20 (×2): qty 2

## 2023-07-20 MED ORDER — PERFLUTREN LIPID MICROSPHERE
1.0000 mL | INTRAVENOUS | Status: AC | PRN
  Administered 2023-07-20: 1 mL via INTRAVENOUS

## 2023-07-20 MED ORDER — POTASSIUM CHLORIDE 10 MEQ/100ML IV SOLN
10.0000 meq | INTRAVENOUS | Status: AC
  Administered 2023-07-20 (×2): 10 meq via INTRAVENOUS
  Filled 2023-07-20 (×2): qty 100

## 2023-07-20 MED ORDER — SODIUM CHLORIDE 0.9 % IV BOLUS
500.0000 mL | Freq: Once | INTRAVENOUS | Status: AC
  Administered 2023-07-20: 500 mL via INTRAVENOUS

## 2023-07-20 NOTE — Progress Notes (Signed)
 PROGRESS NOTE  [name] FMW:[id number] DOB: [birth year] DOA: 07/19/2023 PCP: Watt Harlene BROCKS, MD   LOS: 0 days   Brief Narrative / Interim history: 89 year old male with CAD status post PCI, pacemaker in place, HTN, HLD, TIA, PAF not on anticoagulation due to GI bleed comes into the hospital with increased confusion, cough, URI and a recent fall due to weakness.  He was found to have influenza A, and due to persistent weakness he was admitted to the hospital.  Subjective / 24h Interval events: Mild confusion noted this morning.  He tells me he does not feel good.  Complains of generalized muscle aches, weakness.  Denies any chest pain, complains of a cough.  Assesement and Plan: Principal Problem:   Influenza A Active Problems:   Chronic kidney disease, stage 3b (HCC)   AF (paroxysmal atrial fibrillation) (HCC)   DM2 (diabetes mellitus, type 2) (HCC)   Thrombocytopenia (HCC)   Pacemaker   Principal problem Influenza A-patient started on Tamiflu , continue for total of 5 days.  Continue supportive care, delirium precautions, Tylenol  as needed for fever. -Due to weakness, recent fall, obtain PT consult  Active problems Possible pacemaker abnormalities -apparently device was interrogated in the ED and based on EDP discussion with [employer] representative there was concern for high output and pacemaker settings possible need to be adjusted.  Cardiology consulted, appreciate input  Acute metabolic encephalopathy-in the setting of acute illness, there is a component of in-hospital delirium.  Most recent hospitalization about a year ago also mentions confusion with concern for potentially underlying mild memory issues -Supportive care  CAD-stable, no apparent chest pain  Hypothyroidism-continue home medications.  TSH normal  PAF-not on anticoagulation due to prior GI bleeds  Thrombocytopenia-this is chronic, due to underlying cirrhosis based on prior notes.  Hypokalemia,  hypomagnesemia-replaced yesterday and normalized today  Chronic kidney disease stage IIIb-baseline creatinine ranging over the past couple years between 1.3 and 1.6.  Currently close to baseline at 1.7.  Provide additional limited fluids given poor p.o. intake  Scheduled Meds:  aspirin  EC  81 mg Oral Daily   dapagliflozin  propanediol  5 mg Oral Daily   oseltamivir   30 mg Oral BID   thyroid   120 mg Oral QAC breakfast   Continuous Infusions: PRN Meds:.acetaminophen  **OR** acetaminophen , ondansetron  **OR** ondansetron  (ZOFRAN ) IV  Current Outpatient Medications  Medication Instructions   ARMOUR THYROID  120 MG tablet TAKE 1 TABLET (120 MG TOTAL) BY MOUTH DAILY BEFORE BREAKFAST.   aspirin  EC 81 mg, Oral, Daily   blood glucose meter kit and supplies KIT Dispense based on patient and insurance preference. Use up to four times daily as directed.   dapagliflozin  propanediol (FARXIGA ) 5 mg, Oral, Daily   IBUPROFEN PO 2 tablets, Daily PRN    Diet Orders (From admission, onward)     Start     Ordered   07/19/23 1951  Diet Heart Room service appropriate? Yes; Fluid consistency: Thin  Diet effective now       Question Answer Comment  Room service appropriate? Yes   Fluid consistency: Thin      07/19/23 1951            DVT prophylaxis: SCDs Start: 07/19/23 1950   Lab Results  Component Value Date   PLT 78 (L) 07/20/2023      Code Status: Full Code  Family Communication: wife at bedside   Status is: Observation The patient will require care spanning > 2 midnights and should be moved to inpatient  because: Persistent weakness, confusion, IV fluids   Level of care: Med-Surg  Consultants:  Cardiology   Objective: Vitals:   07/20/23 0030 07/20/23 0139 07/20/23 0440 07/20/23 0836  BP: (!) 141/55 (!) 150/83 (!) 149/84 (!) 150/74  Pulse: (!) 102 99 93 89  Resp: (!) 31 (!) 22 18 19   Temp:  98.6 F (37 C) 98.1 F (36.7 C)   TempSrc:  Oral Oral   SpO2: 93% 94% 96% 98%     Intake/Output Summary (Last 24 hours) at 07/20/2023 0955 Last data filed at 07/20/2023 0400 Gross per 24 hour  Intake 158.33 ml  Output --  Net 158.33 ml   Wt Readings from Last 3 Encounters:  08/20/22 93.8 kg  02/13/22 94.3 kg  01/22/22 95.2 kg    Examination:  Constitutional: NAD Eyes: no scleral icterus ENMT: Mucous membranes are moist.  Neck: normal, supple Respiratory: clear to auscultation bilaterally, no wheezing, no crackles. Normal respiratory effort. No accessory muscle use.  Cardiovascular: Regular rate and rhythm, no murmurs / rubs / gallops. No LE edema.  Abdomen: non distended, no tenderness. Bowel sounds positive.  Musculoskeletal: no clubbing / cyanosis.  Skin: no rashes Neurologic: non focal   Data Reviewed: I have independently reviewed following labs and imaging studies   CBC Recent Labs  Lab 07/19/23 1709 07/20/23 0640  WBC 3.6* 4.1  HGB 13.8 13.7  HCT 39.7 39.2  PLT 82* 78*  MCV 105.0* 103.2*  MCH 36.5* 36.1*  MCHC 34.8 34.9  RDW 14.4 14.4  LYMPHSABS 0.3*  --   MONOABS 0.4  --   EOSABS 0.4  --   BASOSABS 0.0  --     Recent Labs  Lab 07/19/23 1706 07/19/23 1709 07/20/23 0640  NA  --  140 138  K  --  3.2* 4.4  CL  --  112* 103  CO2  --  18* 24  GLUCOSE  --  89 115*  BUN  --  17 22  CREATININE  --  1.33* 1.72*  CALCIUM   --  7.1* 8.9  AST  --  36 59*  ALT  --  18 27  ALKPHOS  --  61 80  BILITOT  --  3.1* 3.5*  ALBUMIN  --  2.8* 3.4*  MG  --  1.6* 2.5*  TSH  --   --  1.153  AMMONIA 14  --   --     ------------------------------------------------------------------------------------------------------------------ No results for input(s): CHOL, HDL, LDLCALC, TRIG, CHOLHDL, LDLDIRECT in the last 72 hours.  Lab Results  Component Value Date   HGBA1C 5.6 08/12/2022   ------------------------------------------------------------------------------------------------------------------ Recent Labs    07/20/23 0640  TSH  1.153    Cardiac Enzymes No results for input(s): CKMB, TROPONINI, MYOGLOBIN in the last 168 hours.  Invalid input(s): CK ------------------------------------------------------------------------------------------------------------------    Component Value Date/Time   BNP 117.3 (H) 09/09/2020 0220    CBG: No results for input(s): GLUCAP in the last 168 hours.  Recent Results (from the past 240 hours)  Resp panel by RT-PCR (RSV, Flu A&B, Covid) Anterior Nasal Swab     Status: Abnormal   Collection Time: 07/19/23  4:43 PM   Specimen: Anterior Nasal Swab  Result Value Ref Range Status   SARS Coronavirus 2 by RT PCR NEGATIVE NEGATIVE Final   Influenza A by PCR POSITIVE (A) NEGATIVE Final   Influenza B by PCR NEGATIVE NEGATIVE Final    Comment: (NOTE) The Xpert Xpress SARS-CoV-2/FLU/RSV plus assay is intended as an aid  in the diagnosis of influenza from Nasopharyngeal swab specimens and should not be used as a sole basis for treatment. Nasal washings and aspirates are unacceptable for Xpert Xpress SARS-CoV-2/FLU/RSV testing.  Fact Sheet for Patients: bloggercourse.com  Fact Sheet for Healthcare Providers: seriousbroker.it  This test is not yet approved or cleared by the [place]  FDA and has been authorized for detection and/or diagnosis of SARS-CoV-2 by FDA under an Emergency Use Authorization (EUA). This EUA will remain in effect (meaning this test can be used) for the duration of the COVID-19 declaration under Section 564(b)(1) of the Act, 21 U.S.C. section 360bbb-3(b)(1), unless the authorization is terminated or revoked.     Resp Syncytial Virus by PCR NEGATIVE NEGATIVE Final    Comment: (NOTE) Fact Sheet for Patients: bloggercourse.com  Fact Sheet for Healthcare Providers: seriousbroker.it  This test is not yet approved or cleared by the [place]   FDA and has been authorized for detection and/or diagnosis of SARS-CoV-2 by FDA under an Emergency Use Authorization (EUA). This EUA will remain in effect (meaning this test can be used) for the duration of the COVID-19 declaration under Section 564(b)(1) of the Act, 21 U.S.C. section 360bbb-3(b)(1), unless the authorization is terminated or revoked.  Performed at [facility], [address] [place], [place] [address]      Radiology Studies: DG Pelvis 1-2 Views Result Date: 07/19/2023 CLINICAL DATA:  Pain after fall EXAM: PELVIS - 1 VIEW COMPARISON:  X-ray 08/10/2022 and CT scan. FINDINGS: Osteopenia. No fracture or dislocation. Preserved hip joints and pubic symphysis. Mild degenerative changes of the sacroiliac joints. Hyperostosis. Surgical changes in the low pelvis. IMPRESSION: Mild degenerative changes along the sacroiliac joints. Electronically Signed   By: Ranell Bring M.D.   On: 07/19/2023 17:55   DG Chest 2 View Result Date: 07/19/2023 CLINICAL DATA:  Cough and shortness of breath EXAM: CHEST - 2 VIEW COMPARISON:  X-ray 08/13/2022. FINDINGS: Enlarged cardiopericardial silhouette with vascular congestion. Interstitial prominence again seen. Prominent calcifications in the area of the mitral valve annulus. Left upper chest pacemaker. No consolidation, pneumothorax or effusion. Film is under penetrated. Diffuse degenerative changes of the spine. There is compression deformity along vertebral bodies in the thoracolumbar junction, unchanged from previous. IMPRESSION: Enlarged heart with a pacemaker and vascular congestion. Compression deformity of a vertebral body at the thoracolumbar junction as on prior, possibly L1. Electronically Signed   By: Ranell Bring M.D.   On: 07/19/2023 17:54     Nilda Fendt, MD, PhD [facility]  Between 7 am - 7 pm I am available, please contact me via Amion (for emergencies) or Securechat (non urgent messages)  Between 7 pm - 7 am I am not  available, please contact night coverage MD/APP via Amion

## 2023-07-20 NOTE — Consult Note (Addendum)
 ELECTROPHYSIOLOGY CONSULT NOTE    Patient ID: [name] MRN: [id number], DOB/AGE: May 05, 1934 89 y.o.  Admit date: 07/19/2023 Date of Consult: 07/20/2023  Primary Physician: Watt Harlene BROCKS, MD Primary Cardiologist: Peter Jordan, MD  Electrophysiologist: Dr. Inocencio   Referring Provider: Dr. Trixie  Patient Profile: [name] is a 89 y.o. male with a history of ventricular standstill s/p PPM, CAD s/p stenting of RCA 2017, Prostate CA s/p radical prostatectomy 1998, HTN, HLD, h/o carotid artery disease s/p L CEA 1998, PAF (no longer on OAC), GI bleeding, chronic balance issues, and TIA   who is being seen today for the evaluation of PPM at the request of Dr. Trixie .  HPI:  [name] is a 89 y.o. male who presented to [facility] ER on 07/19/23 with weakness, muscle aches and mild confusion.  Reported he did not feel good.  He was found to have Influenza A and was started on Tamiflu  on admit.    His PPM was interrogated in the ER and there were concerns for high output and need for PPM adjustments.    He denies chest pain, palpitations, dyspnea, PND, orthopnea, nausea, vomiting, dizziness, syncope, edema, weight gain, or early satiety.   Labs Potassium4.4 (01/07 9359) Magnesium   2.5* (01/07 0640) Creatinine, ser  1.72* (01/07 0640) PLT  78* (01/07 0640) HGB  13.7 (01/07 0640) WBC 4.1 (01/07 0640)  .    Past Medical History:  Diagnosis Date   Anginal pain (HCC)    Anxiety     OCCASIONAL   Arthritis    Asthma due to seasonal allergies    uses inhalers prn   Carotid artery occlusion    Left   Complication of anesthesia     had tremors after prostate surgery   Coronary artery disease    Diverticulitis    recurrent   Family history of anesthesia complication    MOTHER    GERD (gastroesophageal reflux disease)    H/O hiatal hernia    Hearing aid worn    Hyperlipemia    Hypertension    Kidney stone    lithotripsy 2011 w complications, req stents, Dr Nicholaus    Prostate CA [facility])    Shortness of breath    Thyroid  disease    TIA (transient ischemic attack)    Tinnitus      Surgical History:  Past Surgical History:  Procedure Laterality Date   CARDIAC CATHETERIZATION  2008   minimal dz, Dr Calhoun   CARDIAC CATHETERIZATION N/A 10/09/2015   Procedure: Left Heart Cath and Coronary Angiography;  Surgeon: Peter M Jordan, MD;  Location: [facility] INVASIVE CV LAB;  Service: Cardiovascular;  Laterality: N/A;   CARDIAC CATHETERIZATION N/A 10/09/2015   Procedure: Intravascular Pressure Wire/FFR Study;  Surgeon: Peter M Jordan, MD;  Location: [facility] INVASIVE CV LAB;  Service: Cardiovascular;  Laterality: N/A;   CARDIAC CATHETERIZATION N/A 10/09/2015   Procedure: Coronary Stent Intervention;  Surgeon: Peter M Jordan, MD;  Location: [facility] INVASIVE CV LAB;  Service: Cardiovascular;  Laterality: N/A;   CAROTID ENDARTERECTOMY  Left   1998   CHOLECYSTECTOMY     CORONARY STENT PLACEMENT  10/09/2015   DES to RCA   ESOPHAGOGASTRODUODENOSCOPY (EGD) WITH PROPOFOL  Left 03/26/2017   Procedure: ESOPHAGOGASTRODUODENOSCOPY (EGD) WITH PROPOFOL ;  Surgeon: Burnette Fallow, MD;  Location: [facility] ENDOSCOPY;  Service: Endoscopy;  Laterality: Left;   LEFT HEART CATHETERIZATION WITH CORONARY ANGIOGRAM N/A 09/15/2013   Procedure: LEFT HEART CATHETERIZATION WITH CORONARY ANGIOGRAM;  Surgeon: Maude  M Jordan, MD;  Location: [facility] CATH LAB;  Service: Cardiovascular;  Laterality: N/A;   PACEMAKER IMPLANT N/A 08/12/2022   Procedure: PACEMAKER IMPLANT;  Surgeon: Inocencio Soyla Lunger, MD;  Location: [facility] INVASIVE CV LAB;  Service: Cardiovascular;  Laterality: N/A;   PROSTATECTOMY  1998   radical for prostate cancer   TEMPORARY PACEMAKER N/A 08/11/2022   Procedure: TEMPORARY PACEMAKER;  Surgeon: Ladona Heinz, MD;  Location: [facility] INVASIVE CV LAB;  Service: Cardiovascular;  Laterality: N/A;     Medications Prior to Admission  Medication Sig Dispense Refill Last Dose/Taking   ARMOUR THYROID  120 MG tablet TAKE 1 TABLET  (120 MG TOTAL) BY MOUTH DAILY BEFORE BREAKFAST. (Patient taking differently: Take 120 mg by mouth daily before breakfast.) 90 tablet 0 07/18/2023   aspirin  EC 81 MG tablet Take 81 mg by mouth daily.   07/18/2023   dapagliflozin  propanediol (FARXIGA ) 5 MG TABS tablet Take 1 tablet (5 mg total) by mouth daily. 90 tablet 0 07/18/2023   IBUPROFEN PO Take 2 tablets by mouth daily as needed.   07/18/2023   blood glucose meter kit and supplies KIT Dispense based on patient and insurance preference. Use up to four times daily as directed. 1 each 0     Inpatient Medications:   aspirin  EC  81 mg Oral Daily   dapagliflozin  propanediol  5 mg Oral Daily   oseltamivir   30 mg Oral BID   thyroid   120 mg Oral QAC breakfast    Allergies:  Allergies  Allergen Reactions   Ciprofloxacin Other (See Comments)    Hallucinations or jitteriness   Codeine Other (See Comments)    Hallucinations    Flexeril [Cyclobenzaprine] Other (See Comments)    Made me feel goofy   Imdur  [Isosorbide  Nitrate] Other (See Comments)    Caused headaches   Methocarbamol Other (See Comments)    Made me feel goofy   Other Other (See Comments)    CANNOT HAVE ANY FOODS WITH SEEDS    Strawberry Extract Other (See Comments)    CANNOT HAVE ANY FOODS WITH SEEDS   Sulfa Antibiotics Other (See Comments)    Chills and shaking- serum sickness    Sulfamethoxazole Other (See Comments)    Chills and shaking- serum sickness   Sulfonamide Derivatives Other (See Comments)    Chills and shaking serum sickness   Tomato Other (See Comments)    CANNOT HAVE ANY FOODS WITH SEEDS   Flagyl [Metronidazole] Rash    Family History  Problem Relation Age of Onset   Heart disease Mother    Heart disease Father    Kidney failure Father    Heart disease Sister    Heart attack Sister    Dementia Sister    Sjogren's syndrome Child    Hashimoto's thyroiditis Child    CAD Child      Physical Exam: Vitals:   07/20/23 0030 07/20/23 0139  07/20/23 0440 07/20/23 0836  BP: (!) 141/55 (!) 150/83 (!) 149/84 (!) 150/74  Pulse: (!) 102 99 93 89  Resp: (!) 31 (!) 22 18 19   Temp:  98.6 F (37 C) 98.1 F (36.7 C)   TempSrc:  Oral Oral   SpO2: 93% 94% 96% 98%    GEN- NAD,alert / oriented to self, place, situational confusion, normal affect HEENT: Normocephalic, atraumatic Lungs- CTAB, Normal effort. Implant site well healed, no tethering  Heart- Regular rate and rhythm, No M/G/R.  GI- Soft, NT, ND.  Extremities- No clubbing, cyanosis, or edema  Radiology/Studies: DG Pelvis 1-2 Views Result Date: 07/19/2023 CLINICAL DATA:  Pain after fall EXAM: PELVIS - 1 VIEW COMPARISON:  X-ray 08/10/2022 and CT scan. FINDINGS: Osteopenia. No fracture or dislocation. Preserved hip joints and pubic symphysis. Mild degenerative changes of the sacroiliac joints. Hyperostosis. Surgical changes in the low pelvis. IMPRESSION: Mild degenerative changes along the sacroiliac joints. Electronically Signed   By: Ranell Bring M.D.   On: 07/19/2023 17:55   DG Chest 2 View Result Date: 07/19/2023 CLINICAL DATA:  Cough and shortness of breath EXAM: CHEST - 2 VIEW COMPARISON:  X-ray 08/13/2022. FINDINGS: Enlarged cardiopericardial silhouette with vascular congestion. Interstitial prominence again seen. Prominent calcifications in the area of the mitral valve annulus. Left upper chest pacemaker. No consolidation, pneumothorax or effusion. Film is under penetrated. Diffuse degenerative changes of the spine. There is compression deformity along vertebral bodies in the thoracolumbar junction, unchanged from previous. IMPRESSION: Enlarged heart with a pacemaker and vascular congestion. Compression deformity of a vertebral body at the thoracolumbar junction as on prior, possibly L1. Electronically Signed   By: Ranell Bring M.D.   On: 07/19/2023 17:54    EKG:07/19/23 AS VP, 107 bpm (personally reviewed)  TELEMETRY: VP 80-100's, occ bigeminy  (personally reviewed)  DEVICE  HISTORY:  [employer]ott Dual Chamber PPM 08/12/22 for CHB  Assessment/Plan:  CHB with Dual Chamber PPM  -device interrogation shows normal device function, he had been lost to EP follow up and was still at acute phase outputs > he was reprogrammed by industry to auto-capture in A&V.   -no acute interventions at this time for device  -outpatient follow up arranged for patient with EP  AF  Not on OAC due to GIB  -monitor burden on device   Influenza A  -per primary     Acute Metabolic Encephalopathy  -per primary team   CAD -per primary   CKD IIIb -per primary   EP Kane Kusek be available PRN. Please call back if needs arise.   For questions or updates, please contact [facility] Please consult [email or web address] for contact info under Cardiology/STEMI.  Signed, Daphne Barrack, MSN, APRN, NP-C, AGACNP-BC [facility] - Electrophysiology  07/20/2023, 1:09 PM    I have seen and examined this patient with Daphne Barrack.  Agree with above, note added to reflect my findings.  Patient tender to the hospital with fevers.  He was found to be flu a positive.  He was started on Tamiflu .  He does have a pacemaker.  It was interrogated in the emergency room and outputs were high.  The patient feels well currently and is without complaint.  GEN: Well nourished, well developed, in no acute distress  HEENT: normal  Neck: no JVD, carotid bruits, or masses Cardiac: RRR; no murmurs, rubs, or gallops,no edema  Respiratory:  clear to auscultation bilaterally, normal work of breathing GI: soft, nontender, nondistended, + BS MS: no deformity or atrophy  Skin: warm and dry, device site well healed Neuro:  Strength and sensation are intact Psych: euthymic mood, full affect   Complete heart block: Patient is post dual-chamber pacemaker.  Device was adjusted to chronic outputs.  No further device therapy at this time.  Jayanth Szczesniak arrange for follow-up in clinic. Atrial fibrillation: Continue to monitor.   Patient with GI bleeding. Influenza A: Per primary team  Sonjia Wilcoxson M. Demaya Hardge MD 07/20/2023 6:36 PM

## 2023-07-20 NOTE — Progress Notes (Signed)
 Patient is disoriented and confused times 4. Calm and could not answer the admission assessment. Waiting for the family to help with the admission questioner completion. Charge nurse made aware of it. Connected to the tele monitors and the hearing aids(bilateral is kept in a box at the bedside

## 2023-07-20 NOTE — Plan of Care (Signed)

## 2023-07-20 NOTE — Evaluation (Signed)
 Physical Therapy Evaluation Patient Details Name: [name] MRN: [id number] DOB: 06-18-1934 Today's Date: 07/20/2023  History of Present Illness  89 y.o. male presents to [facility] on 07/19/2023 with fall due to weakness. Pt found to have flu A. PMH: TIA, HTN, HLD, caroitd artery occlusion, CAD, prostate CA, tinnitus, PAF.  Clinical Impression  Pt presents to PT with deficits in cognition, functional mobility, gait, balance, endurance, strength, power. Pt has very poor awareness of deficits at this time and requires physical assistance to steady often when ambulating. Pt refuses to utilize a RW at home, only attempting during this session after max encouragement from PT. Pt does not utilize walker well, and is likely safer with support of cane at this time. Pt remains at a high risk for falls due to weakness and impaired endurance. Pt will require assistance for all out of bed mobility at this time. Pt reports she has a neighbor who will assist with stair negotiation at home. Pt's spouse declines post-acute PT services, reporting the pt is stubborn and things will not go well. She elects to lead pt in mobility at home. PT has no further DME recommendations at this time. PT will attempt to return tomorrow to progress mobility as much as possible in the acute setting.        If plan is discharge home, recommend the following: A little help with walking and/or transfers;A lot of help with bathing/dressing/bathroom;Assistance with cooking/housework;Direct supervision/assist for medications management;Direct supervision/assist for financial management;Assist for transportation;Help with stairs or ramp for entrance;Supervision due to cognitive status   Can travel by private vehicle   Yes    Equipment Recommendations None recommended by PT  Recommendations for Other Services       Functional Status Assessment Patient has had a recent decline in their functional status and demonstrates the ability to  make significant improvements in function in a reasonable and predictable amount of time.     Precautions / Restrictions Precautions Precautions: Fall Precaution Comments: cognitive impairment Restrictions Weight Bearing Restrictions Per Provider Order: No      Mobility  Bed Mobility Overal bed mobility: Needs Assistance Bed Mobility: Supine to Sit, Sit to Supine     Supine to sit: Min assist, HOB elevated, Used rails Sit to supine: Min assist, HOB elevated        Transfers Overall transfer level: Needs assistance Equipment used: Straight cane, Rolling walker (2 wheels) Transfers: Sit to/from Stand Sit to Stand: Contact guard assist, Min assist           General transfer comment: CGA with cane from bed, minA with RW from lower couch    Ambulation/Gait Ambulation/Gait assistance: Min assist Gait Distance (Feet): 20 Feet (20' x 2 trials, initial trial with SPC, 2nd trial with RW) Assistive device: Rolling walker (2 wheels), Straight cane Gait Pattern/deviations: Step-to pattern Gait velocity: reduced Gait velocity interpretation: <1.31 ft/sec, indicative of household ambulator   General Gait Details: pt with slowed step-to gait, multiple stoppages of gait with distraction. Pt with poor management of RW, with walker very far in front of BOS. Pt with a posterior lean at times when ambulating with [facility]  Stairs            Wheelchair Mobility     Tilt Bed    Modified Rankin (Stroke Patients Only)       Balance Overall balance assessment: Needs assistance Sitting-balance support: No upper extremity supported, Feet supported Sitting balance-Leahy Scale: Poor Sitting balance - Comments: intermittent  minA due to posterior lean Postural control: Posterior lean Standing balance support: Bilateral upper extremity supported Standing balance-Leahy Scale: Poor Standing balance comment: CGA with BUE support. Pt often searching for BUE support when utilizing cane                              Pertinent Vitals/Pain Pain Assessment Pain Assessment: No/denies pain    Home Living Family/patient expects to be discharged to:: Private residence Living Arrangements: Spouse/significant other Available Help at Discharge: Family;Available 24 hours/day Type of Home: House Home Access: Stairs to enter Entrance Stairs-Rails: None Entrance Stairs-Number of Steps: 1 Alternate Level Stairs-Number of Steps: 7 Home Layout: Multi-level;Bed/bath upstairs Home Equipment: Agricultural Consultant (2 wheels);Cane - single point;Rollator (4 wheels);BSC/3in1      Prior Function Prior Level of Function : Needs assist             Mobility Comments: pt ambulates with use of SPC, refuses to utilize walker. At times pt and family utilize rollator as a wheelchair ADLs Comments: assist for IADLs, intermittent assist for ADLs     Extremity/Trunk Assessment   Upper Extremity Assessment Upper Extremity Assessment: Generalized weakness    Lower Extremity Assessment Lower Extremity Assessment: Generalized weakness    Cervical / Trunk Assessment Cervical / Trunk Assessment: Kyphotic  Communication   Communication Communication: Hearing impairment Cueing Techniques: Verbal cues;Tactile cues  Cognition Arousal: Alert Behavior During Therapy: Impulsive Overall Cognitive Status: Impaired/Different from baseline Area of Impairment: Orientation, Memory, Attention, Following commands, Safety/judgement, Awareness, Problem solving                 Orientation Level: Disoriented to, Time, Situation (pt appears to have forgotten he had a fall) Current Attention Level: Sustained Memory: Decreased recall of precautions, Decreased short-term memory Following Commands: Follows one step commands with increased time, Follows multi-step commands inconsistently Safety/Judgement: Decreased awareness of safety, Decreased awareness of deficits Awareness:  Intellectual Problem Solving: Slow processing, Requires verbal cues          General Comments General comments (skin integrity, edema, etc.): mild tachycardia into low 100s. Pt fatigues quickly    Exercises     Assessment/Plan    PT Assessment Patient needs continued PT services  PT Problem List Decreased strength;Decreased activity tolerance;Decreased balance;Decreased mobility;Decreased cognition;Decreased knowledge of use of DME;Decreased safety awareness;Decreased knowledge of precautions;Cardiopulmonary status limiting activity       PT Treatment Interventions DME instruction;Gait training;Stair training;Functional mobility training;Therapeutic activities;Therapeutic exercise;Balance training;Neuromuscular re-education;Cognitive remediation;Patient/family education    PT Goals (Current goals can be found in the Care Plan section)  Acute Rehab PT Goals Patient Stated Goal: to return home PT Goal Formulation: With patient/family Time For Goal Achievement: 08/03/23 Potential to Achieve Goals: Fair    Frequency Min 1X/week     Co-evaluation               AM-PAC PT 6 Clicks Mobility  Outcome Measure Help needed turning from your back to your side while in a flat bed without using bedrails?: A Little Help needed moving from lying on your back to sitting on the side of a flat bed without using bedrails?: A Little Help needed moving to and from a bed to a chair (including a wheelchair)?: A Little Help needed standing up from a chair using your arms (e.g., wheelchair or bedside chair)?: A Little Help needed to walk in hospital room?: A Little Help needed climbing 3-5 steps with a railing? :  Total 6 Click Score: 16    End of Session Equipment Utilized During Treatment: Gait belt Activity Tolerance: Patient limited by fatigue Patient left: in bed;with call bell/phone within reach;with bed alarm set;with family/visitor present Nurse Communication: Mobility status PT  Visit Diagnosis: Other abnormalities of gait and mobility (R26.89);Muscle weakness (generalized) (M62.81);History of falling (Z91.81)    Time: 8359-8274 PT Time Calculation (min) (ACUTE ONLY): 45 min   Charges:   PT Evaluation $PT Eval Low Complexity: 1 Low PT Treatments $Gait Training: 8-22 mins PT General Charges $$ ACUTE PT VISIT: 1 Visit         Bernardino JINNY Ruth, PT, DPT Acute Rehabilitation Office [phone]   Bernardino JINNY Ruth 07/20/2023, 5:33 PM

## 2023-07-21 ENCOUNTER — Other Ambulatory Visit (HOSPITAL_COMMUNITY): Payer: Self-pay

## 2023-07-21 DIAGNOSIS — J101 Influenza due to other identified influenza virus with other respiratory manifestations: Secondary | ICD-10-CM | POA: Diagnosis not present

## 2023-07-21 LAB — COMPREHENSIVE METABOLIC PANEL
ALT: 30 U/L (ref 0–44)
AST: 64 U/L — ABNORMAL HIGH (ref 15–41)
Albumin: 3.4 g/dL — ABNORMAL LOW (ref 3.5–5.0)
Alkaline Phosphatase: 78 U/L (ref 38–126)
Anion gap: 13 (ref 5–15)
BUN: 30 mg/dL — ABNORMAL HIGH (ref 8–23)
CO2: 22 mmol/L (ref 22–32)
Calcium: 8.9 mg/dL (ref 8.9–10.3)
Chloride: 100 mmol/L (ref 98–111)
Creatinine, Ser: 1.66 mg/dL — ABNORMAL HIGH (ref 0.61–1.24)
GFR, Estimated: 40 mL/min — ABNORMAL LOW (ref >60)
Glucose, Bld: 93 mg/dL (ref 70–99)
Potassium: 4.1 mmol/L (ref 3.5–5.1)
Sodium: 135 mmol/L (ref 135–145)
Total Bilirubin: 3 mg/dL — ABNORMAL HIGH (ref 0.0–1.2)
Total Protein: 6.8 g/dL (ref 6.5–8.1)

## 2023-07-21 LAB — CBC
HCT: 39.3 % (ref 39.0–52.0)
Hemoglobin: 14 g/dL (ref 13.0–17.0)
MCH: 36.5 pg — ABNORMAL HIGH (ref 26.0–34.0)
MCHC: 35.6 g/dL (ref 30.0–36.0)
MCV: 102.3 fL — ABNORMAL HIGH (ref 80.0–100.0)
Platelets: 69 10*3/uL — ABNORMAL LOW (ref 150–400)
RBC: 3.84 MIL/uL — ABNORMAL LOW (ref 4.22–5.81)
RDW: 14.4 % (ref 11.5–15.5)
WBC: 3.9 10*3/uL — ABNORMAL LOW (ref 4.0–10.5)
nRBC: 0 % (ref 0.0–0.2)

## 2023-07-21 LAB — MAGNESIUM: Magnesium: 2.2 mg/dL (ref 1.7–2.4)

## 2023-07-21 MED ORDER — GUAIFENESIN-DM 100-10 MG/5ML PO SYRP
10.0000 mL | ORAL_SOLUTION | ORAL | 0 refills | Status: DC | PRN
  Filled 2023-07-21: qty 118, 2d supply, fill #0

## 2023-07-21 MED ORDER — OSELTAMIVIR PHOSPHATE 30 MG PO CAPS
30.0000 mg | ORAL_CAPSULE | Freq: Two times a day (BID) | ORAL | 0 refills | Status: AC
  Filled 2023-07-21: qty 6, 3d supply, fill #0

## 2023-07-21 NOTE — TOC Transition Note (Signed)
 Transition of Care [facility]) - Discharge Note   Patient Details  Name: [name] MRN: [id number] Date of Birth: Feb 16, 1934  Transition of Care [facility]) CM/SW Contact:  Hendricks KANDICE Her, RN Phone Number: 07/21/2023, 10:17 AM   Clinical Narrative:     Patient will DC to home today with Wife  RNCM spoke with Wife and she is declining any Home Health at this time, per her Husband. No DME is needed. Wife will transport and patient will follow up as directed on AVS.   No additional TOC needs            Patient Goals and CMS Choice            Discharge Placement                       Discharge Plan and Services Additional resources added to the After Visit Summary for                                       Social Drivers of Health (SDOH) Interventions SDOH Screenings   Food Insecurity: No Food Insecurity (07/20/2023)  Housing: Low Risk  (07/20/2023)  Transportation Needs: No Transportation Needs (07/20/2023)  Utilities: Not At Risk (07/20/2023)  Alcohol  Screen: Low Risk  (01/12/2022)  Depression (PHQ2-9): Low Risk  (01/12/2022)  Financial Resource Strain: Low Risk  (09/18/2019)  Physical Activity: Inactive (01/12/2022)  Social Connections: Moderately Integrated (07/20/2023)  Stress: No Stress Concern Present (01/12/2022)  Tobacco Use: Medium Risk (09/08/2022)   [facility]     Readmission Risk Interventions    08/19/2022    3:35 PM  Readmission Risk Prevention Plan  Transportation Screening Complete  PCP or Specialist Appt within 5-7 Days Complete  Home Care Screening Complete  Medication Review (RN CM) Complete

## 2023-07-21 NOTE — Plan of Care (Signed)

## 2023-07-21 NOTE — Discharge Summary (Signed)
 Physician Discharge Summary  [name]:[id number] DOB: 1933/11/21 DOA: 07/19/2023  PCP: Watt Harlene BROCKS, MD  Admit date: 07/19/2023 Discharge date: 07/21/2023  Admitted From: home Disposition:  home  Recommendations for Outpatient Follow-up:  Follow up with PCP in 1-2 weeks  Home Health: PT Equipment/Devices: none  Discharge Condition: stable CODE STATUS: Full code Diet Orders (From admission, onward)     Start     Ordered   07/19/23 1951  Diet Heart Room service appropriate? Yes; Fluid consistency: Thin  Diet effective now       Question Answer Comment  Room service appropriate? Yes   Fluid consistency: Thin      07/19/23 1951            HPI: Per admitting MD, [name] is a 89 y.o. male with medical history significant of CAD s/p PCI, PPM, HTN, HLD, CEA, TIA, PAF not on AC due to h/o GIBs. Pt normally AAOx3 and ambulatory at baseline. Wife reports: Pt with dry cough recently.  Has started to become more confused recently as well.  Seemed to onset after grandchildren visited him for holidays.  Today he was getting up from going to the bathroom and was attempting to walk when his legs got weak and he slowly fell to the ground.  Laying on R side.  No significant head injury nor LOC.  Hospital Course / Discharge diagnoses: Principal Problem:   Influenza A Active Problems:   Chronic kidney disease, stage 3b (HCC)   AF (paroxysmal atrial fibrillation) (HCC)   DM2 (diabetes mellitus, type 2) (HCC)   Thrombocytopenia (HCC)   Pacemaker   Acute metabolic encephalopathy   Principal problem Influenza A-patient was admitted to the hospital in the setting of febrile illness, encephalopathy, found to have influenza A.  He was started on Tamiflu  and supportive care with IV fluids.  With this, he improved, close to baseline per family, and will be discharged home in stable condition with 3 additional days of Tamiflu  to complete a 5-day course.  PT was consulted, he was  felt to require rehab however family prefers for him to be home.  Home health PT ordered at the time of discharge  Active problems Possible pacemaker abnormalities -apparently device was interrogated in the ED and based on EDP discussion with [employer] representative there was concern for high output and pacemaker settings possible need to be adjusted.  Cardiology consulted, evaluated patient and adjusted the pacemaker  Acute metabolic encephalopathy-in the setting of acute illness, there is a component of in-hospital delirium.  Improved, returned to baseline per wife  CAD-stable, no apparent chest pain.  Resume home medications Hypothyroidism-continue home medications.  TSH normal PAF-not on anticoagulation due to prior GI bleeds Thrombocytopenia-this is chronic, due to underlying cirrhosis based on prior notes. Hypokalemia, hypomagnesemia-replaced and normalized  Chronic kidney disease stage IIIb-baseline creatinine ranging over the past couple years between 1.3 and 1.6.  Currently at baseline.  Sepsis ruled out   Discharge Instructions   Allergies as of 07/21/2023       Reactions   Ciprofloxacin Other (See Comments)   Hallucinations or jitteriness   Codeine Other (See Comments)   Hallucinations   Flexeril [cyclobenzaprine] Other (See Comments)   Made me feel goofy   Imdur  [isosorbide  Nitrate] Other (See Comments)   Caused headaches   Methocarbamol Other (See Comments)   Made me feel goofy   Other Other (See Comments)   CANNOT HAVE ANY FOODS WITH SEEDS  Strawberry Extract Other (See Comments)   CANNOT HAVE ANY FOODS WITH SEEDS   Sulfa Antibiotics Other (See Comments)   Chills and shaking- serum sickness   Sulfamethoxazole Other (See Comments)   Chills and shaking- serum sickness   Sulfonamide Derivatives Other (See Comments)   Chills and shaking serum sickness   Tomato Other (See Comments)   CANNOT HAVE ANY FOODS WITH SEEDS   Flagyl [metronidazole] Rash         Medication List     TAKE these medications    Armour Thyroid  120 MG tablet Generic drug: thyroid  TAKE 1 TABLET (120 MG TOTAL) BY MOUTH DAILY BEFORE BREAKFAST. What changed: See the new instructions.   aspirin  EC 81 MG tablet Take 81 mg by mouth daily.   blood glucose meter kit and supplies Kit Dispense based on patient and insurance preference. Use up to four times daily as directed.   dapagliflozin  propanediol 5 MG Tabs tablet Commonly known as: Farxiga  Take 1 tablet (5 mg total) by mouth daily.   guaiFENesin -dextromethorphan  100-10 MG/5ML syrup Commonly known as: ROBITUSSIN DM Take 10 mLs by mouth every 4 (four) hours as needed for cough (chest congestion).   IBUPROFEN PO Take 2 tablets by mouth daily as needed.   oseltamivir  30 MG capsule Commonly known as: TAMIFLU  Take 1 capsule (30 mg total) by mouth 2 (two) times daily for 3 days.        Follow-up Information     Copland, Harlene BROCKS, MD Follow up in 1 week(s).   Specialty: Family Medicine Contact information: [address] [place] [address] [phone]                 Consultations: Cardiology   Procedures/Studies:  ECHOCARDIOGRAM COMPLETE Result Date: 07/20/2023    ECHOCARDIOGRAM REPORT   Patient Name:   [name] Date of Exam: 07/20/2023 Medical Rec #:  [id number]    Height:       66.0 in Accession #:    [id number]   Weight:       206.8 lb Date of Birth:  24-Dec-1933    BSA:          2.028 m Patient Age:    89 years     BP:           149/84 mmHg Patient Gender: M            HR:           82 bpm. Exam Location:  Inpatient Procedure: 2D Echo, Cardiac Doppler and Color Doppler Indications:    Aortic stenosis  History:        Patient has prior history of Echocardiogram examinations, most                 recent 08/12/2022. TIA, Arrythmias:Atrial Fibrillation,                 Signs/Symptoms:Chest Pain; Risk Factors:Hypertension.  Sonographer:    Lanell Maduro Referring Phys: [id number]  CHRISTOPHER L SCHUMANN IMPRESSIONS  1. Left ventricular ejection fraction, by estimation, is 55 to 60%. The left ventricle has normal function. The left ventricle has no regional wall motion abnormalities. There is moderate concentric left ventricular hypertrophy. Indeterminate diastolic filling due to E-A fusion.  2. Right ventricular systolic function is normal. The right ventricular size is normal. Tricuspid regurgitation signal is inadequate for assessing PA pressure.  3. The mitral valve is degenerative. No evidence of mitral valve regurgitation. Mild mitral stenosis.  The mean mitral valve gradient is 4.2 mmHg with average heart rate of 87 bpm. Severe mitral annular calcification.  4. The aortic valve is tricuspid. There is severe calcifcation of the aortic valve. There is severe thickening of the aortic valve. Aortic valve regurgitation is not visualized. Moderate aortic valve stenosis. Aortic valve area, by VTI measures 1.08 cm. Aortic valve mean gradient measures 17.0 mmHg. Aortic valve Vmax measures 2.67 m/s. Comparison(s): No significant change from prior study. FINDINGS  Left Ventricle: Left ventricular ejection fraction, by estimation, is 55 to 60%. The left ventricle has normal function. The left ventricle has no regional wall motion abnormalities. Definity  contrast agent was given IV to delineate the left ventricular  endocardial borders. The left ventricular internal cavity size was normal in size. There is moderate concentric left ventricular hypertrophy. Abnormal (paradoxical) septal motion, consistent with RV pacemaker. Indeterminate diastolic filling due to E-A fusion. Right Ventricle: The right ventricular size is normal. No increase in right ventricular wall thickness. Right ventricular systolic function is normal. Tricuspid regurgitation signal is inadequate for assessing PA pressure. Left Atrium: Left atrial size was normal in size. Right Atrium: Right atrial size was normal in size.  Pericardium: There is no evidence of pericardial effusion. Mitral Valve: The mitral valve is degenerative in appearance. Severe mitral annular calcification. No evidence of mitral valve regurgitation. Mild mitral valve stenosis. The mean mitral valve gradient is 4.2 mmHg with average heart rate of 87 bpm. Tricuspid Valve: The tricuspid valve is grossly normal. Tricuspid valve regurgitation is trivial. No evidence of tricuspid stenosis. Aortic Valve: The aortic valve is tricuspid. There is severe calcifcation of the aortic valve. There is severe thickening of the aortic valve. Aortic valve regurgitation is not visualized. Moderate aortic stenosis is present. Aortic valve mean gradient measures 17.0 mmHg. Aortic valve peak gradient measures 28.5 mmHg. Aortic valve area, by VTI measures 1.08 cm. Pulmonic Valve: The pulmonic valve was grossly normal. Pulmonic valve regurgitation is not visualized. No evidence of pulmonic stenosis. Aorta: The aortic root and ascending aorta are structurally normal, with no evidence of dilitation. Venous: The inferior vena cava was not well visualized. IAS/Shunts: The atrial septum is grossly normal. Additional Comments: A device lead is visualized in the right atrium and right ventricle.  LEFT VENTRICLE PLAX 2D LVIDd:         4.30 cm     Diastology LVIDs:         2.50 cm     LV e' medial:    5.44 cm/s LV PW:         1.60 cm     LV E/e' medial:  18.6 LV IVS:        1.50 cm     LV e' lateral:   4.97 cm/s LVOT diam:     2.17 cm     LV E/e' lateral: 20.3 LV SV:         58 LV SV Index:   28 LVOT Area:     3.70 cm  LV Volumes (MOD) LV vol d, MOD A2C: 43.3 ml LV vol d, MOD A4C: 67.4 ml LV vol s, MOD A2C: 20.3 ml LV vol s, MOD A4C: 26.0 ml LV SV MOD A2C:     23.0 ml LV SV MOD A4C:     67.4 ml LV SV MOD BP:      33.4 ml RIGHT VENTRICLE RV Basal diam:  2.50 cm RV S prime:     13.20 cm/s TAPSE (M-mode): 1.9 cm  LEFT ATRIUM             Index        RIGHT ATRIUM          Index LA diam:        5.10  cm 2.51 cm/m   RA Area:     9.86 cm LA Vol (A2C):   53.9 ml 26.57 ml/m  RA Volume:   18.40 ml 9.07 ml/m LA Vol (A4C):   56.0 ml 27.61 ml/m LA Biplane Vol: 56.3 ml 27.76 ml/m  AORTIC VALVE AV Area (Vmax):    1.25 cm AV Area (Vmean):   1.17 cm AV Area (VTI):     1.08 cm AV Vmax:           267.00 cm/s AV Vmean:          187.800 cm/s AV VTI:            0.532 m AV Peak Grad:      28.5 mmHg AV Mean Grad:      17.0 mmHg LVOT Vmax:         90.00 cm/s LVOT Vmean:        59.200 cm/s LVOT VTI:          0.156 m LVOT/AV VTI ratio: 0.29  AORTA Ao Root diam: 3.30 cm Ao Asc diam:  3.20 cm MITRAL VALVE MV Area (PHT): 5.97 cm     SHUNTS MV Area VTI:   1.95 cm     Systemic VTI:  0.16 m MV Mean grad:  4.2 mmHg     Systemic Diam: 2.17 cm MV VTI:        0.30 m MV Decel Time: 127 msec MV E velocity: 101.00 cm/s MV A velocity: 145.00 cm/s MV E/A ratio:  0.70 Darryle Decent MD Electronically signed by Darryle Decent MD Signature Date/Time: 07/20/2023/2:52:47 PM    Final    DG Pelvis 1-2 Views Result Date: 07/19/2023 CLINICAL DATA:  Pain after fall EXAM: PELVIS - 1 VIEW COMPARISON:  X-ray 08/10/2022 and CT scan. FINDINGS: Osteopenia. No fracture or dislocation. Preserved hip joints and pubic symphysis. Mild degenerative changes of the sacroiliac joints. Hyperostosis. Surgical changes in the low pelvis. IMPRESSION: Mild degenerative changes along the sacroiliac joints. Electronically Signed   By: Ranell Bring M.D.   On: 07/19/2023 17:55   DG Chest 2 View Result Date: 07/19/2023 CLINICAL DATA:  Cough and shortness of breath EXAM: CHEST - 2 VIEW COMPARISON:  X-ray 08/13/2022. FINDINGS: Enlarged cardiopericardial silhouette with vascular congestion. Interstitial prominence again seen. Prominent calcifications in the area of the mitral valve annulus. Left upper chest pacemaker. No consolidation, pneumothorax or effusion. Film is under penetrated. Diffuse degenerative changes of the spine. There is compression deformity along vertebral  bodies in the thoracolumbar junction, unchanged from previous. IMPRESSION: Enlarged heart with a pacemaker and vascular congestion. Compression deformity of a vertebral body at the thoracolumbar junction as on prior, possibly L1. Electronically Signed   By: Ranell Bring M.D.   On: 07/19/2023 17:54     Subjective: - no chest pain, shortness of breath, no abdominal pain, nausea or vomiting.   Discharge Exam: BP (!) 143/77 (BP Location: Right Arm)   Pulse 80   Temp 98.5 F (36.9 C) (Oral)   Resp 18   SpO2 96%   General: Pt is alert, awake, not in acute distress Cardiovascular: RRR, S1/S2 +, no rubs, no gallops Respiratory: CTA bilaterally, no wheezing, no rhonchi Abdominal: Soft, NT, ND, bowel sounds +  Extremities: no edema, no cyanosis    The results of significant diagnostics from this hospitalization (including imaging, microbiology, ancillary and laboratory) are listed below for reference.     Microbiology: Recent Results (from the past 240 hours)  Resp panel by RT-PCR (RSV, Flu A&B, Covid) Anterior Nasal Swab     Status: Abnormal   Collection Time: 07/19/23  4:43 PM   Specimen: Anterior Nasal Swab  Result Value Ref Range Status   SARS Coronavirus 2 by RT PCR NEGATIVE NEGATIVE Final   Influenza A by PCR POSITIVE (A) NEGATIVE Final   Influenza B by PCR NEGATIVE NEGATIVE Final    Comment: (NOTE) The Xpert Xpress SARS-CoV-2/FLU/RSV plus assay is intended as an aid in the diagnosis of influenza from Nasopharyngeal swab specimens and should not be used as a sole basis for treatment. Nasal washings and aspirates are unacceptable for Xpert Xpress SARS-CoV-2/FLU/RSV testing.  Fact Sheet for Patients: bloggercourse.com  Fact Sheet for Healthcare Providers: seriousbroker.it  This test is not yet approved or cleared by the [place]  FDA and has been authorized for detection and/or diagnosis of SARS-CoV-2 by FDA under an  Emergency Use Authorization (EUA). This EUA will remain in effect (meaning this test can be used) for the duration of the COVID-19 declaration under Section 564(b)(1) of the Act, 21 U.S.C. section 360bbb-3(b)(1), unless the authorization is terminated or revoked.     Resp Syncytial Virus by PCR NEGATIVE NEGATIVE Final    Comment: (NOTE) Fact Sheet for Patients: bloggercourse.com  Fact Sheet for Healthcare Providers: seriousbroker.it  This test is not yet approved or cleared by the [place]  FDA and has been authorized for detection and/or diagnosis of SARS-CoV-2 by FDA under an Emergency Use Authorization (EUA). This EUA will remain in effect (meaning this test can be used) for the duration of the COVID-19 declaration under Section 564(b)(1) of the Act, 21 U.S.C. section 360bbb-3(b)(1), unless the authorization is terminated or revoked.  Performed at [facility], [address], [place], [place] [address]      Labs: Basic Metabolic Panel: Recent Labs  Lab 07/19/23 1709 07/20/23 0640 07/21/23 0647  NA 140 138 135  K 3.2* 4.4 4.1  CL 112* 103 100  CO2 18* 24 22  GLUCOSE 89 115* 93  BUN 17 22 30*  CREATININE 1.33* 1.72* 1.66*  CALCIUM  7.1* 8.9 8.9  MG 1.6* 2.5* 2.2   Liver Function Tests: Recent Labs  Lab 07/19/23 1709 07/20/23 0640 07/21/23 0647  AST 36 59* 64*  ALT 18 27 30   ALKPHOS 61 80 78  BILITOT 3.1* 3.5* 3.0*  PROT 5.5* 7.0 6.8  ALBUMIN 2.8* 3.4* 3.4*   CBC: Recent Labs  Lab 07/19/23 1709 07/20/23 0640 07/21/23 0647  WBC 3.6* 4.1 3.9*  NEUTROABS 2.4  --   --   HGB 13.8 13.7 14.0  HCT 39.7 39.2 39.3  MCV 105.0* 103.2* 102.3*  PLT 82* 78* 69*   CBG: No results for input(s): GLUCAP in the last 168 hours. Hgb A1c No results for input(s): HGBA1C in the last 72 hours. Lipid Profile No results for input(s): CHOL, HDL, LDLCALC, TRIG, CHOLHDL, LDLDIRECT in the last 72  hours. Thyroid  function studies Recent Labs    07/20/23 0640  TSH 1.153   Urinalysis    Component Value Date/Time   COLORURINE YELLOW 08/11/2022 0103   APPEARANCEUR CLEAR 08/11/2022 0103   LABSPEC 1.030 08/11/2022 0103   PHURINE 6.0 08/11/2022 0103   GLUCOSEU >=500 (A) 08/11/2022 0103  HGBUR SMALL (A) 08/11/2022 0103   BILIRUBINUR NEGATIVE 08/11/2022 0103   BILIRUBINUR negative 01/22/2022 1536   BILIRUBINUR negative 01/30/2013 0948   KETONESUR 5 (A) 08/11/2022 0103   PROTEINUR NEGATIVE 08/11/2022 0103   UROBILINOGEN 0.2 01/22/2022 1536   UROBILINOGEN 0.2 03/24/2015 0525   NITRITE NEGATIVE 08/11/2022 0103   LEUKOCYTESUR NEGATIVE 08/11/2022 0103    FURTHER DISCHARGE INSTRUCTIONS:   Get Medicines reviewed and adjusted: Please take all your medications with you for your next visit with your Primary MD   Laboratory/radiological data: Please request your Primary MD to go over all hospital tests and procedure/radiological results at the follow up, please ask your Primary MD to get all Hospital records sent to his/her office.   In some cases, they will be blood work, cultures and biopsy results pending at the time of your discharge. Please request that your primary care M.D. goes through all the records of your hospital data and follows up on these results.   Also Note the following: If you experience worsening of your admission symptoms, develop shortness of breath, life threatening emergency, suicidal or homicidal thoughts you must seek medical attention immediately by calling 911 or calling your MD immediately  if symptoms less severe.   You must read complete instructions/literature along with all the possible adverse reactions/side effects for all the Medicines you take and that have been prescribed to you. Take any new Medicines after you have completely understood and accpet all the possible adverse reactions/side effects.    Do not drive when taking Pain medications or  sleeping medications (Benzodaizepines)   Do not take more than prescribed Pain, Sleep and Anxiety Medications. It is not advisable to combine anxiety,sleep and pain medications without talking with your primary care practitioner   Special Instructions: If you have smoked or chewed Tobacco  in the last 2 yrs please stop smoking, stop any regular Alcohol   and or any Recreational drug use.   Wear Seat belts while driving.   Please note: You were cared for by a hospitalist during your hospital stay. Once you are discharged, your primary care physician will handle any further medical issues. Please note that NO REFILLS for any discharge medications will be authorized once you are discharged, as it is imperative that you return to your primary care physician (or establish a relationship with a primary care physician if you do not have one) for your post hospital discharge needs so that they can reassess your need for medications and monitor your lab values.  Time coordinating discharge: 40 minutes  SIGNED:  Nilda Fendt, MD, PhD 07/21/2023, 9:12 AM

## 2023-07-21 NOTE — Progress Notes (Signed)
 Physical Therapy Treatment Patient Details Name: [name] MRN: [id number] DOB: [birth year] Today's Date: 07/21/2023   History of Present Illness 89 y.o. male presents to [facility] hospital on 07/19/2023 with fall due to weakness. Pt found to have flu A. PMH: TIA, HTN, HLD, caroitd artery occlusion, CAD, prostate CA, tinnitus, PAF.    PT Comments  Pt in recliner with wife present upon arrival and agreeable to PT session. Worked on standing balance and stair negotiation in today's session. Pt was able to stand with MinA and SP cane. Upon standing, pt had posterior lean requiring MinA to correct. Pt required assistance to don pants and MinA to correct balance while attempting to pull up pants. Pt was able to ascend/descend 3 steps with CGA for safety and cues for sequencing. Pt utilized SP cane and 1 HR for balance. Wife present and stated that family will assist the pt into the car and with the steps at home. Pt is progressing towards goals. Would continue to recommend <3hrs rehab, however, family has declined and wishes to take the pt home with 24/7 support. Acute PT to follow.      If plan is discharge home, recommend the following: A little help with walking and/or transfers;A lot of help with bathing/dressing/bathroom;Assistance with cooking/housework;Direct supervision/assist for medications management;Direct supervision/assist for financial management;Assist for transportation;Help with stairs or ramp for entrance;Supervision due to cognitive status   Can travel by private vehicle     Yes  Equipment Recommendations  None recommended by PT       Precautions / Restrictions Precautions Precautions: Fall Precaution Comments: cognitive impairment, HOH Restrictions Weight Bearing Restrictions Per Provider Order: No     Mobility  Bed Mobility  General bed mobility comments: In recliner upon arrival    Transfers Overall transfer level: Needs assistance Equipment used: Straight cane Transfers:  Sit to/from Stand Sit to Stand: Min assist    General transfer comment: MinA to rise with SP cane, MinA for stability with balance while dressing    Ambulation/Gait Ambulation/Gait assistance: Min assist Gait Distance (Feet): 10 Feet Assistive device: Straight cane Gait Pattern/deviations: Step-to pattern Gait velocity: reduced     General Gait Details: occasional posterior lean, MinA for balance with cues for safety   Stairs Stairs: Yes Stairs assistance: Contact guard assist Stair Management: One rail Right, Alternating pattern, Step to pattern, Sideways, Forwards, With cane Number of Stairs: 3 General stair comments: pt prefers to use SP cane in left hand and rail in R hand for step to progression forwards. Uses sideways pattern when descending. CGA for safety     Balance Overall balance assessment: Needs assistance Sitting-balance support: No upper extremity supported, Feet supported Sitting balance-Leahy Scale: Fair Sitting balance - Comments: in recliner Postural control: Posterior lean Standing balance support: Bilateral upper extremity supported Standing balance-Leahy Scale: Poor Standing balance comment: posterior lean with dynamic activities, MinA to maintain balance      Cognition Arousal: Alert Behavior During Therapy: Impulsive Overall Cognitive Status: Impaired/Different from baseline Area of Impairment: Orientation, Memory, Attention, Following commands, Safety/judgement, Awareness, Problem solving    Orientation Level: Disoriented to, Time, Situation (pt appears to have forgotten he had a fall) Current Attention Level: Sustained Memory: Decreased recall of precautions, Decreased short-term memory Following Commands: Follows one step commands with increased time, Follows multi-step commands inconsistently Safety/Judgement: Decreased awareness of safety, Decreased awareness of deficits Awareness: Intellectual Problem Solving: Slow processing, Requires  verbal cues General Comments: increased time to process, needs cues for safety  General Comments General comments (skin integrity, edema, etc.): HR in low 100s. Wife present and supportive in session      Pertinent Vitals/Pain Pain Assessment Pain Assessment: No/denies pain           PT Goals (current goals can now be found in the care plan section) Acute Rehab PT Goals Patient Stated Goal: to return home PT Goal Formulation: With patient/family Time For Goal Achievement: 08/03/23 Potential to Achieve Goals: Fair Progress towards PT goals: Progressing toward goals    Frequency    Min 1X/week       AM-PAC PT 6 Clicks Mobility   Outcome Measure  Help needed turning from your back to your side while in a flat bed without using bedrails?: A Little Help needed moving from lying on your back to sitting on the side of a flat bed without using bedrails?: A Little Help needed moving to and from a bed to a chair (including a wheelchair)?: A Little Help needed standing up from a chair using your arms (e.g., wheelchair or bedside chair)?: A Little Help needed to walk in hospital room?: A Little Help needed climbing 3-5 steps with a railing? : Total 6 Click Score: 16    End of Session Equipment Utilized During Treatment: Gait belt Activity Tolerance: Patient limited by fatigue Patient left: with call bell/phone within reach;with family/visitor present;in chair;with chair alarm set Nurse Communication: Mobility status PT Visit Diagnosis: Other abnormalities of gait and mobility (R26.89);Muscle weakness (generalized) (M62.81);History of falling (Z91.81)     Time: 8990-8970 PT Time Calculation (min) (ACUTE ONLY): 20 min  Charges:    $Therapeutic Activity: 8-22 mins PT General Charges $$ ACUTE PT VISIT: 1 Visit                    Kate ORN, PT, DPT Secure Chat Preferred  Rehab Office [phone]    Kate BRAVO Wendolyn 07/21/2023, 10:34 AM

## 2023-07-22 ENCOUNTER — Telehealth: Payer: Self-pay

## 2023-07-22 NOTE — Transitions of Care (Post Inpatient/ED Visit) (Signed)
   07/22/2023  Name: [name] MRN: [id number] DOB: [birth year]  Today's TOC FU Call Status: Today's TOC FU Call Status:: Successful TOC FU Call Completed TOC FU Call Complete Date: 07/22/23 Patient's Name and Date of Birth confirmed.  Transition Care Management Follow-up Telephone Call Date of Discharge: 07/21/23 Discharge Facility: [facility]) Type of Discharge: Inpatient Admission Primary Inpatient Discharge Diagnosis:: fall How have you been since you were released from the hospital?: Better Any questions or concerns?: No  Items Reviewed: Did you receive and understand the discharge instructions provided?: Yes Medications obtained,verified, and reconciled?: Yes (Medications Reviewed) Any new allergies since your discharge?: No Dietary orders reviewed?: Yes Do you have support at home?: Yes People in Home: spouse  Medications Reviewed Today: Medications Reviewed Today     Reviewed by Emmitt Pan, LPN (Licensed Practical Nurse) on 07/22/23 at 1049  Med List Status: <None>   Medication Order Taking? Sig Documenting Provider Last Dose Status Informant  ARMOUR THYROID  120 MG tablet [id number] No TAKE 1 TABLET (120 MG TOTAL) BY MOUTH DAILY BEFORE BREAKFAST.  Patient taking differently: Take 120 mg by mouth daily before breakfast.   Copland, Harlene BROCKS, MD 07/18/2023 Active Spouse/Significant Other, Pharmacy Records  aspirin  EC 81 MG tablet [id number] No Take 81 mg by mouth daily. [provider] 07/18/2023 Active Spouse/Significant Other, Pharmacy Records  blood glucose meter kit and supplies KIT [id number] No Dispense based on patient and insurance preference. Use up to four times daily as directed. Vann, Jessica U, DO Taking Active Spouse/Significant Other, Pharmacy Records  dapagliflozin  propanediol (FARXIGA ) 5 MG TABS tablet [id number] No Take 1 tablet (5 mg total) by mouth daily. Copland, Harlene BROCKS, MD 07/18/2023 Active Spouse/Significant Other, Pharmacy Records   guaiFENesin -dextromethorphan  (ROBITUSSIN DM) 100-10 MG/5ML syrup [id number]  Take 10 mLs by mouth every 4 (four) hours as needed for cough (chest congestion). Gherghe, Costin M, MD  Active   IBUPROFEN PO [id number] No Take 2 tablets by mouth daily as needed. [provider] 07/18/2023 Active Spouse/Significant Other, Pharmacy Records  oseltamivir  (TAMIFLU ) 30 MG capsule [id number]  Take 1 capsule (30 mg total) by mouth 2 (two) times daily for 3 days. Trixie Nilda HERO, MD  Active   Med List Note Marisa Nathanel LOISE Bishop 02/07/19 2203): Cell is [phone]            Home Care and Equipment/Supplies: Were Home Health Services Ordered?: NA Any new equipment or medical supplies ordered?: NA  Functional Questionnaire: Do you need assistance with bathing/showering or dressing?: No Do you need assistance with meal preparation?: No Do you need assistance with eating?: No Do you have difficulty maintaining continence: No Do you need assistance with getting out of bed/getting out of a chair/moving?: No Do you have difficulty managing or taking your medications?: No  Follow up appointments reviewed: PCP Follow-up appointment confirmed?: No (declined , will call later) MD Provider Line [phone] Given: No Specialist Hospital Follow-up appointment confirmed?: NA Do you need transportation to your follow-up appointment?: No Do you understand care options if your condition(s) worsen?: Yes-patient verbalized understanding    SIGNATURE Pan Emmitt, LPN [facility] Nurse Health Advisor Direct Dial [phone]

## 2023-07-27 ENCOUNTER — Telehealth: Payer: Self-pay | Admitting: *Deleted

## 2023-07-27 NOTE — Transitions of Care (Post Inpatient/ED Visit) (Signed)
   07/27/2023  Name: [name] MRN: [id number] DOB: 10/05/1933  Today's TOC FU Call Status: Today's TOC FU Call Status:: Unsuccessful Call (1st Attempt) Unsuccessful Call (1st Attempt) Date: 07/27/23  Attempted to reach the patient regarding the most recent Inpatient/ED visit.  Follow Up Plan: Additional outreach attempts will be made to reach the patient to complete the Transitions of Care (Post Inpatient/ED visit) call.   Cathlean Headland BSN RN Population Health- Transition of Care Team.  [facility] [phone]

## 2023-07-28 ENCOUNTER — Telehealth: Payer: Self-pay

## 2023-07-28 NOTE — Transitions of Care (Post Inpatient/ED Visit) (Signed)
   07/28/2023  Name: TARIG BARROZO MRN: [id number] DOB: 1934/06/10  Today's TOC FU Call Status: Today's TOC FU Call Status:: Successful TOC FU Call Completed TOC FU Call Complete Date: 07/28/23 Patient's Name and Date of Birth confirmed.  Transition Care Management Follow-up Telephone Call How have you been since you were released from the hospital?: Better (still has a cough) Any questions or concerns?: No  Items Reviewed: Did you receive and understand the discharge instructions provided?: Yes Medications obtained,verified, and reconciled?: Yes (Medications Reviewed) Any new allergies since your discharge?: No Dietary orders reviewed?: Yes Type of Diet Ordered:: eating and drinking well Do you have support at home?: Yes People in Home: spouse  Medications Reviewed Today: Medications Reviewed Today   Medications were not reviewed in this encounter     Home Care and Equipment/Supplies: Were Home Health Services Ordered?: NA Any new equipment or medical supplies ordered?: NA  Functional Questionnaire: Do you need assistance with bathing/showering or dressing?: No (wife helps with socks) Do you need assistance with meal preparation?: Yes (wife) Do you need assistance with eating?: No Do you have difficulty maintaining continence: No Do you need assistance with getting out of bed/getting out of a chair/moving?: No (uses cane) Do you have difficulty managing or taking your medications?: Yes (wife)  Follow up appointments reviewed: PCP Follow-up appointment confirmed?: No (wife chose not to take patient in.) MD Provider Line Number:[phone] Given: No Specialist Hospital Follow-up appointment confirmed?: NA Do you need transportation to your follow-up appointment?: No Do you understand care options if your condition(s) worsen?: Yes-patient verbalized understanding  SDOH Interventions Today    Flowsheet Row Most Recent Value  SDOH Interventions   Food Insecurity  Interventions Intervention Not Indicated  Housing Interventions Intervention Not Indicated  Transportation Interventions Intervention Not Indicated  Utilities Interventions Intervention Not Indicated      TOC Interventions Today    Flowsheet Row Most Recent Value  TOC Interventions   TOC Interventions Discussed/Reviewed TOC Interventions Discussed, TOC Interventions Reviewed, Post discharge activity limitations per provider, S/S of infection       Spoke with wife who is on the [place]. Wife reports patient is hard of hearing, States patient continues to have a cough but otherwise is doing well.  No follow up appointment made and wife is waiting until patient is better to make the appointment. Wife reports that patient is doing well and denies any needs today.  Reviewed TOC program and wife declined. I provided my contact information for wife to call back if needed.  Orpha Blade, RN, BSN, CEN [employer]- Transition of Care Team.  [facility] [phone]

## 2023-08-12 ENCOUNTER — Ambulatory Visit: Payer: Medicare Other

## 2023-08-12 DIAGNOSIS — I442 Atrioventricular block, complete: Secondary | ICD-10-CM | POA: Diagnosis not present

## 2023-08-12 LAB — CUP PACEART REMOTE DEVICE CHECK
Battery Remaining Longevity: 82 mo
Battery Remaining Percentage: 87 %
Battery Voltage: 2.99 V
Brady Statistic AP VP Percent: 1 %
Brady Statistic AP VS Percent: 1 %
Brady Statistic AS VP Percent: 99 %
Brady Statistic AS VS Percent: 1 %
Brady Statistic RA Percent Paced: 1 %
Brady Statistic RV Percent Paced: 99 %
Date Time Interrogation Session: 20250130040014
Implantable Lead Connection Status: 753985
Implantable Lead Connection Status: 753985
Implantable Lead Implant Date: 20240131
Implantable Lead Implant Date: 20240131
Implantable Lead Location: 753859
Implantable Lead Location: 753860
Implantable Pulse Generator Implant Date: 20240131
Lead Channel Impedance Value: 410 Ohm
Lead Channel Impedance Value: 460 Ohm
Lead Channel Pacing Threshold Amplitude: 0.5 V
Lead Channel Pacing Threshold Amplitude: 0.75 V
Lead Channel Pacing Threshold Pulse Width: 0.5 ms
Lead Channel Pacing Threshold Pulse Width: 0.5 ms
Lead Channel Sensing Intrinsic Amplitude: 10.4 mV
Lead Channel Sensing Intrinsic Amplitude: 5 mV
Lead Channel Setting Pacing Amplitude: 1 V
Lead Channel Setting Pacing Amplitude: 5 V
Lead Channel Setting Pacing Pulse Width: 0.5 ms
Lead Channel Setting Sensing Sensitivity: 4 mV
Pulse Gen Model: 2272
Pulse Gen Serial Number: 5680536

## 2023-08-20 ENCOUNTER — Ambulatory Visit: Payer: Medicare Other | Admitting: Pulmonary Disease

## 2023-09-03 ENCOUNTER — Other Ambulatory Visit: Payer: Self-pay | Admitting: Family Medicine

## 2023-09-03 DIAGNOSIS — E119 Type 2 diabetes mellitus without complications: Secondary | ICD-10-CM

## 2023-09-06 ENCOUNTER — Other Ambulatory Visit: Payer: Self-pay | Admitting: Family Medicine

## 2023-09-06 DIAGNOSIS — E039 Hypothyroidism, unspecified: Secondary | ICD-10-CM

## 2023-09-12 NOTE — Progress Notes (Unsigned)
  Electrophysiology Office Note:   Date:  09/14/2023  ID:  [name], DOB 08/07/1933, MRN [id number]  Primary Cardiologist: Peter Swaziland, MD Primary Heart Failure: None Electrophysiologist: Will Jorja Loa, MD       History of Present Illness:   [name] is a 89 y.o. male with h/o ventricular standstill s/p PPM, PAF (not on OAC due to GIB), CAD s/p PCI of RCA 2017, HTN, HLD, CEA, TIA, CKD IIIb, prostate CA s/p radical prostatectomy, chronic balance issues & hypothyroidism seen today for electrophysiology follow up post hospitalization.   Admitted 1/6-07/21/23 for weakness, confusion and muscle aches.  Found to be influenza A positive. Pt was noted to be programmed at acute phase outputs (he was lost to follow up) and was reprogrammed.   Since last being seen in our clinic the patient reports doing well.  His wife accompanies him and states they have had a lot going on but he is doing well. He is ambulatory at home but mostly sits in the window watching outside. He denies chest pain, shortness of breath.   He denies chest pain, palpitations, dyspnea, PND, orthopnea, nausea, vomiting, dizziness, syncope, edema, weight gain, or early satiety.   Review of systems complete and found to be negative unless listed in HPI.   EP Information / Studies Reviewed:    EKG is not ordered today. EKG from 07/20/23 reviewed which showed ASVP, 107 bpm      PPM Interrogation-  reviewed in detail today,  See PACEART report.  Device History: Abbott Dual Chamber PPM implanted 08/12/22 for CHB  Studies:  ECHO 07/2023 > LVEF 55-60%, mitral valve degenerative, mild mitral stenosis, severe mitral annular calcification, severe calcification of the AV with moderate AV stenosis    Arrhythmia / AAD AF    Physical Exam:   VS:  BP 132/68   Pulse 81   Ht 5\' 6"  (1.676 m)   Wt 194 lb (88 kg)   SpO2 96%   BMI 31.31 kg/m    Wt Readings from Last 3 Encounters:  09/14/23 194 lb (88 kg)  08/20/22 206 lb 12.7  oz (93.8 kg)  02/13/22 208 lb (94.3 kg)     GEN: Well nourished, well developed in no acute distress NECK: No JVD; No carotid bruits CARDIAC: Regular rate and rhythm, no murmurs, rubs, gallops RESPIRATORY:  Clear to auscultation without rales, wheezing or rhonchi  ABDOMEN: Soft, non-tender, non-distended EXTREMITIES:  No edema; No deformity   ASSESSMENT AND PLAN:    CHB s/p Abbott PPM  -Normal PPM function -See Pace Art report -atrial output from 5V to 2V changed based on threshold testing with industry support  Atrial Fibrillation  Not on OAC due to GIB  -monitor on device -AMS <1% on device, largely PAC's and AT, longest lasting 6 sec    CAD  PAD s/p CEA  -no anginal symptoms   Disposition:   Follow up with Dr. Elberta Fortis in 12 months  Signed, Canary Brim, NP-C, AGACNP-BC [facility] - Electrophysiology  09/14/2023, 4:02 PM

## 2023-09-14 ENCOUNTER — Ambulatory Visit: Payer: Medicare Other | Attending: Pulmonary Disease | Admitting: Pulmonary Disease

## 2023-09-14 ENCOUNTER — Encounter: Payer: Self-pay | Admitting: Pulmonary Disease

## 2023-09-14 VITALS — BP 132/68 | HR 81 | Ht 66.0 in | Wt 194.0 lb

## 2023-09-14 DIAGNOSIS — I25118 Atherosclerotic heart disease of native coronary artery with other forms of angina pectoris: Secondary | ICD-10-CM | POA: Diagnosis not present

## 2023-09-14 DIAGNOSIS — I480 Paroxysmal atrial fibrillation: Secondary | ICD-10-CM | POA: Diagnosis not present

## 2023-09-14 DIAGNOSIS — I442 Atrioventricular block, complete: Secondary | ICD-10-CM

## 2023-09-14 LAB — CUP PACEART INCLINIC DEVICE CHECK
Date Time Interrogation Session: 20250304155609
Implantable Lead Connection Status: 753985
Implantable Lead Connection Status: 753985
Implantable Lead Implant Date: 20240131
Implantable Lead Implant Date: 20240131
Implantable Lead Location: 753859
Implantable Lead Location: 753860
Implantable Pulse Generator Implant Date: 20240131
Pulse Gen Model: 2272
Pulse Gen Serial Number: 5680536

## 2023-09-14 NOTE — Patient Instructions (Signed)
 Medication Instructions:   Your physician recommends that you continue on your current medications as directed. Please refer to the Current Medication list given to you today.   *If you need a refill on your cardiac medications before your next appointment, please call your pharmacy*   Lab Work:  NONE ORDERED  TODAY    If you have labs (blood work) drawn today and your tests are completely normal, you will receive your results only by: MyChart Message (if you have MyChart) OR A paper copy in the mail If you have any lab test that is abnormal or we need to change your treatment, we will call you to review the results.   Testing/Procedures: NONE ORDERED  TODAY      Follow-Up: At [facility], you and your health needs are our priority.  As part of our continuing mission to provide you with exceptional heart care, we have created designated Provider Care Teams.  These Care Teams include your primary Cardiologist (physician) and Advanced Practice Providers (APPs -  Physician Assistants and Nurse Practitioners) who all work together to provide you with the care you need, when you need it.  We recommend signing up for the patient portal called "MyChart".  Sign up information is provided on this After Visit Summary.  MyChart is used to connect with patients for Virtual Visits (Telemedicine).  Patients are able to view lab/test results, encounter notes, upcoming appointments, etc.  Non-urgent messages can be sent to your provider as well.   To learn more about what you can do with MyChart, go to [email or web address].    Your next appointment:   1 year(s)  Provider:   You may see Will Jorja Loa, MD or one of the following Advanced Practice Providers on your designated Care Team:   Canary Brim, NP   Other Instructions    1st Floor: - Lobby - Registration  - Pharmacy  - Lab - Cafe  2nd Floor: - PV Lab - Diagnostic Testing (echo, CT, nuclear med)  3rd  Floor: - Vacant  4th Floor: - TCTS (cardiothoracic surgery) - AFib Clinic - Structural Heart Clinic - Vascular Surgery  - Vascular Ultrasound  5th Floor: - HeartCare Cardiology (general and EP) - Clinical Pharmacy for coumadin, hypertension, lipid, weight-loss medications, and med management appointments    Valet parking services will be available as well.

## 2023-09-17 NOTE — Progress Notes (Signed)
 Remote pacemaker transmission.

## 2023-10-15 ENCOUNTER — Inpatient Hospital Stay (HOSPITAL_COMMUNITY)
Admission: EM | Admit: 2023-10-15 | Discharge: 2023-10-19 | DRG: 432 | Disposition: A | Discharge status: Home Health | Attending: Family Medicine | Admitting: Family Medicine

## 2023-10-15 ENCOUNTER — Emergency Department (HOSPITAL_COMMUNITY)

## 2023-10-15 ENCOUNTER — Encounter (HOSPITAL_COMMUNITY): Payer: Self-pay

## 2023-10-15 ENCOUNTER — Other Ambulatory Visit: Payer: Self-pay

## 2023-10-15 DIAGNOSIS — Z9079 Acquired absence of other genital organ(s): Secondary | ICD-10-CM

## 2023-10-15 DIAGNOSIS — F05 Delirium due to known physiological condition: Secondary | ICD-10-CM | POA: Diagnosis not present

## 2023-10-15 DIAGNOSIS — Z8249 Family history of ischemic heart disease and other diseases of the circulatory system: Secondary | ICD-10-CM

## 2023-10-15 DIAGNOSIS — I10 Essential (primary) hypertension: Secondary | ICD-10-CM | POA: Diagnosis present

## 2023-10-15 DIAGNOSIS — Z881 Allergy status to other antibiotic agents status: Secondary | ICD-10-CM

## 2023-10-15 DIAGNOSIS — K922 Gastrointestinal hemorrhage, unspecified: Secondary | ICD-10-CM | POA: Diagnosis not present

## 2023-10-15 DIAGNOSIS — Z8546 Personal history of malignant neoplasm of prostate: Secondary | ICD-10-CM

## 2023-10-15 DIAGNOSIS — I2510 Atherosclerotic heart disease of native coronary artery without angina pectoris: Secondary | ICD-10-CM | POA: Diagnosis present

## 2023-10-15 DIAGNOSIS — D696 Thrombocytopenia, unspecified: Secondary | ICD-10-CM | POA: Diagnosis present

## 2023-10-15 DIAGNOSIS — Z87891 Personal history of nicotine dependence: Secondary | ICD-10-CM

## 2023-10-15 DIAGNOSIS — F0390 Unspecified dementia without behavioral disturbance: Secondary | ICD-10-CM | POA: Insufficient documentation

## 2023-10-15 DIAGNOSIS — Z841 Family history of disorders of kidney and ureter: Secondary | ICD-10-CM

## 2023-10-15 DIAGNOSIS — Z91018 Allergy to other foods: Secondary | ICD-10-CM

## 2023-10-15 DIAGNOSIS — E6609 Other obesity due to excess calories: Secondary | ICD-10-CM

## 2023-10-15 DIAGNOSIS — I480 Paroxysmal atrial fibrillation: Secondary | ICD-10-CM | POA: Diagnosis present

## 2023-10-15 DIAGNOSIS — Z950 Presence of cardiac pacemaker: Secondary | ICD-10-CM

## 2023-10-15 DIAGNOSIS — E66811 Obesity, class 1: Secondary | ICD-10-CM

## 2023-10-15 DIAGNOSIS — I350 Nonrheumatic aortic (valve) stenosis: Secondary | ICD-10-CM

## 2023-10-15 DIAGNOSIS — F03B0 Unspecified dementia, moderate, without behavioral disturbance, psychotic disturbance, mood disturbance, and anxiety: Secondary | ICD-10-CM | POA: Diagnosis present

## 2023-10-15 DIAGNOSIS — K219 Gastro-esophageal reflux disease without esophagitis: Secondary | ICD-10-CM | POA: Diagnosis present

## 2023-10-15 DIAGNOSIS — I679 Cerebrovascular disease, unspecified: Secondary | ICD-10-CM | POA: Diagnosis present

## 2023-10-15 DIAGNOSIS — I8511 Secondary esophageal varices with bleeding: Secondary | ICD-10-CM | POA: Diagnosis present

## 2023-10-15 DIAGNOSIS — Z882 Allergy status to sulfonamides status: Secondary | ICD-10-CM

## 2023-10-15 DIAGNOSIS — N1832 Chronic kidney disease, stage 3b: Secondary | ICD-10-CM | POA: Diagnosis present

## 2023-10-15 DIAGNOSIS — I739 Peripheral vascular disease, unspecified: Secondary | ICD-10-CM | POA: Diagnosis present

## 2023-10-15 DIAGNOSIS — E039 Hypothyroidism, unspecified: Secondary | ICD-10-CM | POA: Diagnosis present

## 2023-10-15 DIAGNOSIS — Z888 Allergy status to other drugs, medicaments and biological substances status: Secondary | ICD-10-CM

## 2023-10-15 DIAGNOSIS — K7460 Unspecified cirrhosis of liver: Principal | ICD-10-CM | POA: Diagnosis present

## 2023-10-15 DIAGNOSIS — E785 Hyperlipidemia, unspecified: Secondary | ICD-10-CM | POA: Diagnosis present

## 2023-10-15 DIAGNOSIS — Z6834 Body mass index (BMI) 34.0-34.9, adult: Secondary | ICD-10-CM

## 2023-10-15 DIAGNOSIS — M4846XA Fatigue fracture of vertebra, lumbar region, initial encounter for fracture: Secondary | ICD-10-CM

## 2023-10-15 DIAGNOSIS — D62 Acute posthemorrhagic anemia: Secondary | ICD-10-CM | POA: Diagnosis present

## 2023-10-15 DIAGNOSIS — H9193 Unspecified hearing loss, bilateral: Secondary | ICD-10-CM | POA: Diagnosis present

## 2023-10-15 DIAGNOSIS — I129 Hypertensive chronic kidney disease with stage 1 through stage 4 chronic kidney disease, or unspecified chronic kidney disease: Secondary | ICD-10-CM | POA: Diagnosis present

## 2023-10-15 DIAGNOSIS — Z7982 Long term (current) use of aspirin: Secondary | ICD-10-CM

## 2023-10-15 DIAGNOSIS — J45909 Unspecified asthma, uncomplicated: Secondary | ICD-10-CM | POA: Diagnosis present

## 2023-10-15 DIAGNOSIS — Z8673 Personal history of transient ischemic attack (TIA), and cerebral infarction without residual deficits: Secondary | ICD-10-CM

## 2023-10-15 DIAGNOSIS — Z955 Presence of coronary angioplasty implant and graft: Secondary | ICD-10-CM

## 2023-10-15 DIAGNOSIS — Z885 Allergy status to narcotic agent status: Secondary | ICD-10-CM

## 2023-10-15 DIAGNOSIS — G9341 Metabolic encephalopathy: Secondary | ICD-10-CM | POA: Diagnosis present

## 2023-10-15 DIAGNOSIS — D6959 Other secondary thrombocytopenia: Secondary | ICD-10-CM | POA: Diagnosis present

## 2023-10-15 LAB — COMPREHENSIVE METABOLIC PANEL WITH GFR
ALT: 24 U/L (ref 0–44)
AST: 37 U/L (ref 15–41)
Albumin: 3.1 g/dL — ABNORMAL LOW (ref 3.5–5.0)
Alkaline Phosphatase: 74 U/L (ref 38–126)
Anion gap: 11 (ref 5–15)
BUN: 33 mg/dL — ABNORMAL HIGH (ref 8–23)
CO2: 21 mmol/L — ABNORMAL LOW (ref 22–32)
Calcium: 8.9 mg/dL (ref 8.9–10.3)
Chloride: 105 mmol/L (ref 98–111)
Creatinine, Ser: 1.73 mg/dL — ABNORMAL HIGH (ref 0.61–1.24)
GFR, Estimated: 38 mL/min — ABNORMAL LOW (ref >60)
Glucose, Bld: 140 mg/dL — ABNORMAL HIGH (ref 70–99)
Potassium: 4.8 mmol/L (ref 3.5–5.1)
Sodium: 137 mmol/L (ref 135–145)
Total Bilirubin: 2.4 mg/dL — ABNORMAL HIGH (ref 0.0–1.2)
Total Protein: 6.1 g/dL — ABNORMAL LOW (ref 6.5–8.1)

## 2023-10-15 LAB — CBC WITH DIFFERENTIAL/PLATELET
Abs Immature Granulocytes: 0.42 10*3/uL — ABNORMAL HIGH (ref 0.00–0.07)
Basophils Absolute: 0.1 10*3/uL (ref 0.0–0.1)
Basophils Relative: 1 %
Eosinophils Absolute: 1.1 10*3/uL — ABNORMAL HIGH (ref 0.0–0.5)
Eosinophils Relative: 10 %
HCT: 34.1 % — ABNORMAL LOW (ref 39.0–52.0)
Hemoglobin: 11.6 g/dL — ABNORMAL LOW (ref 13.0–17.0)
Immature Granulocytes: 4 %
Lymphocytes Relative: 13 %
Lymphs Abs: 1.4 10*3/uL (ref 0.7–4.0)
MCH: 36.8 pg — ABNORMAL HIGH (ref 26.0–34.0)
MCHC: 34 g/dL (ref 30.0–36.0)
MCV: 108.3 fL — ABNORMAL HIGH (ref 80.0–100.0)
Monocytes Absolute: 1 10*3/uL (ref 0.1–1.0)
Monocytes Relative: 10 %
Neutro Abs: 6.2 10*3/uL (ref 1.7–7.7)
Neutrophils Relative %: 62 %
Platelets: 118 10*3/uL — ABNORMAL LOW (ref 150–400)
RBC: 3.15 MIL/uL — ABNORMAL LOW (ref 4.22–5.81)
RDW: 15 % (ref 11.5–15.5)
WBC: 10.1 10*3/uL (ref 4.0–10.5)
nRBC: 0 % (ref 0.0–0.2)

## 2023-10-15 LAB — PREPARE RBC (CROSSMATCH)

## 2023-10-15 LAB — ABO/RH: ABO/RH(D): O POS

## 2023-10-15 LAB — LIPASE, BLOOD: Lipase: 29 U/L (ref 11–51)

## 2023-10-15 MED ORDER — IOHEXOL 350 MG/ML SOLN
75.0000 mL | Freq: Once | INTRAVENOUS | Status: AC | PRN
  Administered 2023-10-15: 75 mL via INTRAVENOUS

## 2023-10-15 MED ORDER — PANTOPRAZOLE SODIUM 40 MG IV SOLR
80.0000 mg | Freq: Once | INTRAVENOUS | Status: AC
  Administered 2023-10-16: 80 mg via INTRAVENOUS
  Filled 2023-10-15: qty 20

## 2023-10-15 MED ORDER — SODIUM CHLORIDE 0.9% IV SOLUTION: Freq: Once | INTRAVENOUS | Status: DC

## 2023-10-15 NOTE — ED Notes (Signed)
 The pt  has vomited approx of bright red blood

## 2023-10-15 NOTE — ED Triage Notes (Signed)
 Patient coming from home. Wife noted patient had an episode of transient altered mental status. EMS noted blood clots in vomit. Patient complaining of constipation. Patient confused at baseline, confused to time. Patient BP was initially 93/40 with EMS then rose to 103/50. Periumbilical bruising noted by EMS, nontender. EMS VS 87 HR paced rhythm 114/56 BP 22 RR 178 cbg

## 2023-10-15 NOTE — ED Provider Notes (Incomplete)
 [place] EMERGENCY DEPARTMENT AT [facility] Provider Note   CSN: [id number] Arrival date & time: 10/15/23  2118     History {Add pertinent medical, surgical, social history, OB history to HPI:1} Chief Complaint  Patient presents with  . Hematemesis    [name] is a 89 y.o. male with history of TIA, prostate cancer, atrial fibrillation, and stage III CKD who presents the ED today via EMS with multiple complaints.  Patient's wife is at bedside and states that about an hour ago  HPI     Home Medications Prior to Admission medications   Medication Sig Start Date End Date Taking? Authorizing Provider  aspirin EC 81 MG tablet Take 81 mg by mouth daily.    [provider]  blood glucose meter kit and supplies KIT Dispense based on patient and insurance preference. Use up to four times daily as directed. 09/10/20   Joseph Art, DO  dapagliflozin propanediol (FARXIGA) 5 MG TABS tablet Take 1 tablet (5 mg total) by mouth daily. 09/03/23   Copland, Gwenlyn Found, MD  guaiFENesin-dextromethorphan (ROBITUSSIN DM) 100-10 MG/5ML syrup Take 10 mLs by mouth every 4 (four) hours as needed for cough (chest congestion). 07/21/23   Leatha Gilding, MD  IBUPROFEN PO Take 2 tablets by mouth daily as needed.    [provider]  NP THYROID 120 MG tablet TAKE ONE TABLET BY MOUTH ONE TIME DAILY BEFORE BREAKFAST 09/07/23   Copland, Gwenlyn Found, MD      Allergies    Ciprofloxacin, Codeine, Flexeril [cyclobenzaprine], Imdur [isosorbide nitrate], Methocarbamol, Other, Strawberry extract, Sulfa antibiotics, Sulfamethoxazole, Sulfonamide derivatives, Tomato, and Flagyl [metronidazole]    Review of Systems   Review of Systems  Gastrointestinal:        Blood in vomit    Physical Exam Updated Vital Signs BP (!) 106/44   Pulse 87   Temp 98.1 F (36.7 C) (Oral)   Resp (!) 21   SpO2 98%  Physical Exam Vitals and nursing note reviewed.  Constitutional:      General: He is not  in acute distress.    Appearance: Normal appearance.  HENT:     Head: Normocephalic and atraumatic.     Mouth/Throat:     Mouth: Mucous membranes are moist.  Eyes:     Conjunctiva/sclera: Conjunctivae normal.     Pupils: Pupils are equal, round, and reactive to light.  Cardiovascular:     Rate and Rhythm: Normal rate and regular rhythm.     Pulses: Normal pulses.     Heart sounds: Normal heart sounds.  Pulmonary:     Effort: Pulmonary effort is normal.     Breath sounds: Normal breath sounds.  Abdominal:     Palpations: Abdomen is soft.     Tenderness: There is abdominal tenderness. There is no right CVA tenderness or left CVA tenderness.     Comments: Diffuse abdominal tenderness to palpation  Musculoskeletal:        General: Normal range of motion.     Cervical back: Normal range of motion.  Skin:    General: Skin is warm and dry.     Findings: No rash.  Neurological:     General: No focal deficit present.     Mental Status: He is alert. Mental status is at baseline.     Cranial Nerves: No cranial nerve deficit.     Sensory: No sensory deficit.     Motor: No weakness.  Psychiatric:  Mood and Affect: Mood normal.        Behavior: Behavior normal.    ED Results / Procedures / Treatments   Labs (all labs ordered are listed, but only abnormal results are displayed) Labs Reviewed  COMPREHENSIVE METABOLIC PANEL WITH GFR  CBC WITH DIFFERENTIAL/PLATELET  LIPASE, BLOOD  URINALYSIS, ROUTINE W REFLEX MICROSCOPIC  OCCULT BLOOD GASTRIC / DUODENUM (SPECIMEN CUP)  TYPE AND SCREEN  ABO/RH    EKG None  Radiology No results found.  Procedures Procedures: not indicated. {Document cardiac monitor, telemetry assessment procedure when appropriate:1}  Medications Ordered in ED Medications - No data to display  ED Course/ Medical Decision Making/ A&P   {   Click here for ABCD2, HEART and other calculatorsREFRESH Note before signing :1}                               Medical Decision Making Amount and/or Complexity of Data Reviewed Labs: ordered. Radiology: ordered.  Risk Prescription drug management.   This patient presents to the ED for concern of ***, this involves an extensive number of treatment options, and is a complaint that carries with it a high risk of complications and morbidity.   Differential diagnosis includes: ***   Comorbidities  ***   Additional History  Additional history obtained from *** External records from outside source obtained and reviewed including ***   Cardiac Monitoring / EKG  The patient was maintained on a cardiac monitor.  I personally viewed and interpreted the cardiac monitored which showed: *** with a heart rate of *** bpm.   Lab Tests  I ordered and personally interpreted labs.  The pertinent results include:  ***   Imaging Studies  I ordered imaging studies including ***  I independently visualized and interpreted imaging which showed: *** I agree with the radiologist interpretation   Consultations  I requested consultation with Dr. Doy Hutching with [facility],  and discussed lab and imaging findings as well as pertinent plan - they recommend: will consult and see patient tomorrow.   Problem List / ED Course / Critical Interventions / Medication Management  *** I ordered medications including: *** for ***  Reevaluation of the patient after these medicines showed that the patient {resolved/improved/worsened:23923::"improved"} I have reviewed the patients home medicines and have made adjustments as needed   Social Determinants of Health  ***   Test / Admission - Considered  ***   {Document critical care time when appropriate:1} {Document review of labs and clinical decision tools ie heart score, Chads2Vasc2 etc:1}  {Document your independent review of radiology images, and any outside records:1} {Document your discussion with family members, caretakers, and with  consultants:1} {Document social determinants of health affecting pt's care:1} {Document your decision making why or why not admission, treatments were needed:1} Final Clinical Impression(s) / ED Diagnoses Final diagnoses:  None    Rx / DC Orders ED Discharge Orders     None

## 2023-10-15 NOTE — ED Provider Notes (Signed)
 [place] EMERGENCY DEPARTMENT AT [name] Provider Note   CSN: [id number] Arrival date & time: 10/15/23  2118     History  Chief Complaint  Patient presents with   Hematemesis    [name] is a 89 y.o. male with history of TIA, prostate cancer, atrial fibrillation, and stage III CKD who presents the ED today via EMS with multiple complaints.  Patient's wife is at bedside and states that about an hour ago patient was calling for help from his room.  She states that when she went in there he was leaning towards the side and could not get back up on his own.  He was also more confused than normal at that time.  Symptoms resolved on their own shortly after and he is back to baseline.  She is concerned he may have had a stroke.  When EMS came patient had blood in his sputum and in the ED he had a large amount of blood in his vomit.  He reports generalized abdominal pain but denies chronic NSAID use, blood thinner use, or alcohol use.  Denies dark tarry stools or bright red blood per rectum.  No additional complaints or concerns at this time.    Home Medications Prior to Admission medications   Medication Sig Start Date End Date Taking? Authorizing Provider  aspirin EC 81 MG tablet Take 81 mg by mouth daily.    [provider]  blood glucose meter kit and supplies KIT Dispense based on patient and insurance preference. Use up to four times daily as directed. 09/10/20   Joseph Art, DO  dapagliflozin propanediol (FARXIGA) 5 MG TABS tablet Take 1 tablet (5 mg total) by mouth daily. 09/03/23   Copland, Gwenlyn Found, MD  guaiFENesin-dextromethorphan (ROBITUSSIN DM) 100-10 MG/5ML syrup Take 10 mLs by mouth every 4 (four) hours as needed for cough (chest congestion). 07/21/23   Leatha Gilding, MD  IBUPROFEN PO Take 2 tablets by mouth daily as needed.    [provider]  NP THYROID 120 MG tablet TAKE ONE TABLET BY MOUTH ONE TIME DAILY BEFORE BREAKFAST 09/07/23   Copland,  Gwenlyn Found, MD      Allergies    Ciprofloxacin, Codeine, Flexeril [cyclobenzaprine], Imdur [isosorbide nitrate], Methocarbamol, Other, Strawberry extract, Sulfa antibiotics, Sulfamethoxazole, Sulfonamide derivatives, Tomato, and Flagyl [metronidazole]    Review of Systems   Review of Systems  Gastrointestinal:        Blood in vomit    Physical Exam Updated Vital Signs BP 111/85   Pulse 98   Temp 98.1 F (36.7 C) (Oral)   Resp (!) 26   SpO2 98%  Physical Exam Vitals and nursing note reviewed.  Constitutional:      General: He is not in acute distress.    Appearance: Normal appearance.  HENT:     Head: Normocephalic and atraumatic.     Mouth/Throat:     Mouth: Mucous membranes are moist.  Eyes:     Conjunctiva/sclera: Conjunctivae normal.     Pupils: Pupils are equal, round, and reactive to light.  Cardiovascular:     Rate and Rhythm: Normal rate and regular rhythm.     Pulses: Normal pulses.     Heart sounds: Normal heart sounds.  Pulmonary:     Effort: Pulmonary effort is normal.     Breath sounds: Normal breath sounds.  Abdominal:     Palpations: Abdomen is soft.     Tenderness: There is abdominal tenderness. There is  no right CVA tenderness or left CVA tenderness.     Comments: Diffuse abdominal tenderness to palpation  Musculoskeletal:        General: Normal range of motion.     Cervical back: Normal range of motion.  Skin:    General: Skin is warm and dry.     Findings: No rash.  Neurological:     General: No focal deficit present.     Mental Status: He is alert. Mental status is at baseline.     Cranial Nerves: No cranial nerve deficit.     Sensory: No sensory deficit.     Motor: No weakness.  Psychiatric:        Mood and Affect: Mood normal.        Behavior: Behavior normal.    ED Results / Procedures / Treatments   Labs (all labs ordered are listed, but only abnormal results are displayed) Labs Reviewed  COMPREHENSIVE METABOLIC PANEL WITH GFR -  Abnormal; Notable for the following components:      Result Value   CO2 21 (*)    Glucose, Bld 140 (*)    BUN 33 (*)    Creatinine, Ser 1.73 (*)    Total Protein 6.1 (*)    Albumin 3.1 (*)    Total Bilirubin 2.4 (*)    GFR, Estimated 38 (*)    All other components within normal limits  CBC WITH DIFFERENTIAL/PLATELET - Abnormal; Notable for the following components:   RBC 3.15 (*)    Hemoglobin 11.6 (*)    HCT 34.1 (*)    MCV 108.3 (*)    MCH 36.8 (*)    Platelets 118 (*)    Eosinophils Absolute 1.1 (*)    Abs Immature Granulocytes 0.42 (*)    All other components within normal limits  LIPASE, BLOOD  URINALYSIS, ROUTINE W REFLEX MICROSCOPIC  OCCULT BLOOD GASTRIC / DUODENUM (SPECIMEN CUP)  CBC WITH DIFFERENTIAL/PLATELET  COMPREHENSIVE METABOLIC PANEL WITH GFR  MAGNESIUM  FERRITIN  IRON AND TIBC  PROTIME-INR  APTT  HEMOGLOBIN AND HEMATOCRIT, BLOOD  HEMOGLOBIN AND HEMATOCRIT, BLOOD  TYPE AND SCREEN  ABO/RH  PREPARE RBC (CROSSMATCH)    EKG None  Radiology CT ABDOMEN PELVIS W CONTRAST Result Date: 10/15/2023 CLINICAL DATA:  Acute abdominal pain EXAM: CT ABDOMEN AND PELVIS WITH CONTRAST TECHNIQUE: Multidetector CT imaging of the abdomen and pelvis was performed using the standard protocol following bolus administration of intravenous contrast. RADIATION DOSE REDUCTION: This exam was performed according to the departmental dose-optimization program which includes automated exposure control, adjustment of the mA and/or kV according to patient size and/or use of iterative reconstruction technique. CONTRAST:  75mL OMNIPAQUE IOHEXOL 350 MG/ML SOLN COMPARISON:  08/10/2022 FINDINGS: Lower chest: No acute abnormality. Hepatobiliary: Gallbladder has been surgically removed. Left hepatic cyst is noted. Mild nodularity of the liver is seen. Pancreas: Unremarkable. No pancreatic ductal dilatation or surrounding inflammatory changes. Spleen: Normal in size without focal abnormality.  Adrenals/Urinary Tract: Adrenal glands are within normal limits. Kidneys show the left kidney have a normal enhancement pattern. No calculi are seen. Right kidney is atrophic with a stable 8 mm nonobstructing stone. The ureters are within normal limits. The bladder is partially distended. Stomach/Bowel: Scattered diverticular change of the colon is noted without evidence of diverticulitis. The appendix is within normal limits. Small bowel and stomach are unremarkable. Vascular/Lymphatic: Aortic atherosclerosis. No enlarged abdominal or pelvic lymph nodes. Reproductive: Prostate has been surgically removed. Other: No abdominal wall hernia or abnormality. No abdominopelvic  ascites. Musculoskeletal: No acute or significant osseous findings. Chronic L1 compression deformity is noted. IMPRESSION: Diverticulosis without diverticulitis. Atrophic right kidney. No acute abnormality noted. Electronically Signed   By: Alcide Clever M.D.   On: 10/15/2023 23:56   CT Head Wo Contrast Result Date: 10/15/2023 CLINICAL DATA:  TIA EXAM: CT HEAD WITHOUT CONTRAST TECHNIQUE: Contiguous axial images were obtained from the base of the skull through the vertex without intravenous contrast. RADIATION DOSE REDUCTION: This exam was performed according to the departmental dose-optimization program which includes automated exposure control, adjustment of the mA and/or kV according to patient size and/or use of iterative reconstruction technique. COMPARISON:  None Available. FINDINGS: Brain: No evidence of acute infarction, hemorrhage, hydrocephalus, extra-axial collection or mass lesion/mass effect. There is moderate diffuse atrophy. There is mild periventricular white matter hypodensity, likely chronic small vessel ischemic change. Vascular: Atherosclerotic calcifications are present within the cavernous internal carotid arteries. Skull: Normal. Negative for fracture or focal lesion. Sinuses/Orbits: No acute finding. Other: None. IMPRESSION:  1. No acute intracranial process. Moderate diffuse atrophy. Mild chronic small vessel ischemic change. Electronically Signed   By: Darliss Cheney M.D.   On: 10/15/2023 23:51    Procedures Procedures: not indicated.   Medications Ordered in ED Medications  0.9 %  sodium chloride infusion (Manually program via Guardrails IV Fluids) (has no administration in time range)  acetaminophen (TYLENOL) tablet 650 mg (has no administration in time range)    Or  acetaminophen (TYLENOL) suppository 650 mg (has no administration in time range)  melatonin tablet 3 mg (has no administration in time range)  ondansetron (ZOFRAN) injection 4 mg (has no administration in time range)  pantoprazole (PROTONIX) injection 40 mg (has no administration in time range)  0.9 %  sodium chloride infusion (has no administration in time range)  pantoprazole (PROTONIX) injection 80 mg (80 mg Intravenous Given 10/16/23 0040)  iohexol (OMNIPAQUE) 350 MG/ML injection 75 mL (75 mLs Intravenous Contrast Given 10/15/23 2346)    ED Course/ Medical Decision Making/ A&P                                 Medical Decision Making Amount and/or Complexity of Data Reviewed Labs: ordered. Radiology: ordered.  Risk Prescription drug management. Decision regarding hospitalization.   This patient presents to the ED for concern of AMS and GI bleed, this involves an extensive number of treatment options, and is a complaint that carries with it a high risk of complications and morbidity.   Differential diagnosis includes: CVA, TIA, UTI, upper GI bleed, etc.   Comorbidities  See HPI above   Additional History  Additional history obtained from prior records   Lab Tests  I ordered and personally interpreted labs.  The pertinent results include:   CMP is within normal limits CBC shows 2 point drop in hemoglobin, no signs of infection Lipase is unremarkable ABO/RH - O positive   Imaging Studies  I ordered imaging studies  including CT head and CT abdomen/pelvis  I independently visualized and interpreted imaging which showed:  CT head showed no acute intracranial process.  Moderate diffuse atrophy.  Mild chronic small vessel ischemic changes. CT abdomen pelvis showed diverticulosis without diverticulitis.  Right atrophic kidney.  No acute abnormality noted. I agree with the radiologist interpretation   Consultations  I requested consultation with Dr. Doy Hutching with [facility],  and discussed lab and imaging findings as well as pertinent plan -  they recommend: will consult and see patient tomorrow. I requested consultation with Dr. Arlean Hopping with [facility],  and discussed lab and imaging findings as well as pertinent plan - they recommend: admission for further observation.   Problem List / ED Course / Critical Interventions / Medication Management  Patient presents the ED today after a brief episode of altered mental status as well as an episode of bloody sputum with EMS with diffuse abdominal pain.  Patient had an episode of bloody emesis while in the ED.  No blood thinner use, chronic NSAID use, or alcohol use. No chest pain, shortness of breath, or lightheadedness. I ordered medications including: Protonix for GI bleed  Reevaluation of the patient after these medicines showed that the patient improved I have reviewed the patients home medicines and have made adjustments as needed   Social Determinants of Health  Housing    Test / Admission - Considered  Discussed findings with patient. Patient and wife agreeable with plan for admission.        Final Clinical Impression(s) / ED Diagnoses Final diagnoses:  Acute upper GI bleed    Rx / DC Orders ED Discharge Orders     None         Maxwell Marion, PA-C 10/16/23 0325    Cathren Laine, MD 10/16/23 [phone]

## 2023-10-16 ENCOUNTER — Encounter (HOSPITAL_COMMUNITY)
Admission: EM | Disposition: A | Discharge status: Home Health | Payer: Self-pay | Source: Home / Self Care | Attending: Internal Medicine

## 2023-10-16 ENCOUNTER — Observation Stay (HOSPITAL_COMMUNITY): Admitting: Anesthesiology

## 2023-10-16 ENCOUNTER — Encounter (HOSPITAL_COMMUNITY): Payer: Self-pay | Admitting: Internal Medicine

## 2023-10-16 DIAGNOSIS — I8500 Esophageal varices without bleeding: Secondary | ICD-10-CM | POA: Diagnosis not present

## 2023-10-16 DIAGNOSIS — I2510 Atherosclerotic heart disease of native coronary artery without angina pectoris: Secondary | ICD-10-CM | POA: Diagnosis present

## 2023-10-16 DIAGNOSIS — Z8249 Family history of ischemic heart disease and other diseases of the circulatory system: Secondary | ICD-10-CM | POA: Diagnosis not present

## 2023-10-16 DIAGNOSIS — I129 Hypertensive chronic kidney disease with stage 1 through stage 4 chronic kidney disease, or unspecified chronic kidney disease: Secondary | ICD-10-CM | POA: Diagnosis present

## 2023-10-16 DIAGNOSIS — E039 Hypothyroidism, unspecified: Secondary | ICD-10-CM | POA: Diagnosis present

## 2023-10-16 DIAGNOSIS — J45909 Unspecified asthma, uncomplicated: Secondary | ICD-10-CM | POA: Diagnosis present

## 2023-10-16 DIAGNOSIS — I8511 Secondary esophageal varices with bleeding: Secondary | ICD-10-CM | POA: Diagnosis present

## 2023-10-16 DIAGNOSIS — Z87891 Personal history of nicotine dependence: Secondary | ICD-10-CM | POA: Diagnosis not present

## 2023-10-16 DIAGNOSIS — E785 Hyperlipidemia, unspecified: Secondary | ICD-10-CM | POA: Diagnosis present

## 2023-10-16 DIAGNOSIS — E66811 Obesity, class 1: Secondary | ICD-10-CM | POA: Diagnosis present

## 2023-10-16 DIAGNOSIS — I480 Paroxysmal atrial fibrillation: Secondary | ICD-10-CM | POA: Diagnosis present

## 2023-10-16 DIAGNOSIS — K922 Gastrointestinal hemorrhage, unspecified: Secondary | ICD-10-CM | POA: Diagnosis present

## 2023-10-16 DIAGNOSIS — M4846XA Fatigue fracture of vertebra, lumbar region, initial encounter for fracture: Secondary | ICD-10-CM

## 2023-10-16 DIAGNOSIS — Z7982 Long term (current) use of aspirin: Secondary | ICD-10-CM | POA: Diagnosis not present

## 2023-10-16 DIAGNOSIS — I350 Nonrheumatic aortic (valve) stenosis: Secondary | ICD-10-CM | POA: Diagnosis present

## 2023-10-16 DIAGNOSIS — F05 Delirium due to known physiological condition: Secondary | ICD-10-CM | POA: Diagnosis not present

## 2023-10-16 DIAGNOSIS — I739 Peripheral vascular disease, unspecified: Secondary | ICD-10-CM | POA: Diagnosis present

## 2023-10-16 DIAGNOSIS — D62 Acute posthemorrhagic anemia: Secondary | ICD-10-CM | POA: Diagnosis present

## 2023-10-16 DIAGNOSIS — F03B0 Unspecified dementia, moderate, without behavioral disturbance, psychotic disturbance, mood disturbance, and anxiety: Secondary | ICD-10-CM | POA: Diagnosis present

## 2023-10-16 DIAGNOSIS — Z6834 Body mass index (BMI) 34.0-34.9, adult: Secondary | ICD-10-CM | POA: Diagnosis not present

## 2023-10-16 DIAGNOSIS — N1832 Chronic kidney disease, stage 3b: Secondary | ICD-10-CM | POA: Diagnosis present

## 2023-10-16 DIAGNOSIS — Z955 Presence of coronary angioplasty implant and graft: Secondary | ICD-10-CM | POA: Diagnosis not present

## 2023-10-16 DIAGNOSIS — K7460 Unspecified cirrhosis of liver: Secondary | ICD-10-CM | POA: Diagnosis present

## 2023-10-16 DIAGNOSIS — G9341 Metabolic encephalopathy: Secondary | ICD-10-CM | POA: Diagnosis present

## 2023-10-16 DIAGNOSIS — D6959 Other secondary thrombocytopenia: Secondary | ICD-10-CM | POA: Diagnosis present

## 2023-10-16 DIAGNOSIS — Z841 Family history of disorders of kidney and ureter: Secondary | ICD-10-CM | POA: Diagnosis not present

## 2023-10-16 DIAGNOSIS — Z8546 Personal history of malignant neoplasm of prostate: Secondary | ICD-10-CM | POA: Diagnosis not present

## 2023-10-16 HISTORY — PX: ESOPHAGEAL BANDING: SHX5518

## 2023-10-16 HISTORY — PX: ESOPHAGOGASTRODUODENOSCOPY: SHX5428

## 2023-10-16 LAB — CBC WITH DIFFERENTIAL/PLATELET
Abs Immature Granulocytes: 0.33 10*3/uL — ABNORMAL HIGH (ref 0.00–0.07)
Basophils Absolute: 0.1 10*3/uL (ref 0.0–0.1)
Basophils Relative: 1 %
Eosinophils Absolute: 0.2 10*3/uL (ref 0.0–0.5)
Eosinophils Relative: 3 %
HCT: 32 % — ABNORMAL LOW (ref 39.0–52.0)
Hemoglobin: 11.1 g/dL — ABNORMAL LOW (ref 13.0–17.0)
Immature Granulocytes: 5 %
Lymphocytes Relative: 13 %
Lymphs Abs: 1 10*3/uL (ref 0.7–4.0)
MCH: 36.3 pg — ABNORMAL HIGH (ref 26.0–34.0)
MCHC: 34.7 g/dL (ref 30.0–36.0)
MCV: 104.6 fL — ABNORMAL HIGH (ref 80.0–100.0)
Monocytes Absolute: 1 10*3/uL (ref 0.1–1.0)
Monocytes Relative: 14 %
Neutro Abs: 4.7 10*3/uL (ref 1.7–7.7)
Neutrophils Relative %: 64 %
Platelets: 93 10*3/uL — ABNORMAL LOW (ref 150–400)
RBC: 3.06 MIL/uL — ABNORMAL LOW (ref 4.22–5.81)
RDW: 15.7 % — ABNORMAL HIGH (ref 11.5–15.5)
WBC: 7.3 10*3/uL (ref 4.0–10.5)
nRBC: 0 % (ref 0.0–0.2)

## 2023-10-16 LAB — HEMOGLOBIN AND HEMATOCRIT, BLOOD
HCT: 32 % — ABNORMAL LOW (ref 39.0–52.0)
HCT: 30.5 % — ABNORMAL LOW (ref 39.0–52.0)
Hemoglobin: 11.1 g/dL — ABNORMAL LOW (ref 13.0–17.0)
Hemoglobin: 10.5 g/dL — ABNORMAL LOW (ref 13.0–17.0)

## 2023-10-16 LAB — IRON AND TIBC
Iron: 283 ug/dL — ABNORMAL HIGH (ref 45–182)
Saturation Ratios: 84 % — ABNORMAL HIGH (ref 17.9–39.5)
TIBC: 339 ug/dL (ref 250–450)
UIBC: 56 ug/dL

## 2023-10-16 LAB — GLUCOSE, CAPILLARY
Glucose-Capillary: 99 mg/dL (ref 70–99)
Glucose-Capillary: 130 mg/dL — ABNORMAL HIGH (ref 70–99)

## 2023-10-16 LAB — COMPREHENSIVE METABOLIC PANEL WITH GFR
ALT: 28 U/L (ref 0–44)
AST: 41 U/L (ref 15–41)
Albumin: 3.1 g/dL — ABNORMAL LOW (ref 3.5–5.0)
Alkaline Phosphatase: 62 U/L (ref 38–126)
Anion gap: 12 (ref 5–15)
BUN: 52 mg/dL — ABNORMAL HIGH (ref 8–23)
CO2: 21 mmol/L — ABNORMAL LOW (ref 22–32)
Calcium: 9.3 mg/dL (ref 8.9–10.3)
Chloride: 108 mmol/L (ref 98–111)
Creatinine, Ser: 1.94 mg/dL — ABNORMAL HIGH (ref 0.61–1.24)
GFR, Estimated: 33 mL/min — ABNORMAL LOW (ref >60)
Glucose, Bld: 122 mg/dL — ABNORMAL HIGH (ref 70–99)
Potassium: 5 mmol/L (ref 3.5–5.1)
Sodium: 141 mmol/L (ref 135–145)
Total Bilirubin: 2.8 mg/dL — ABNORMAL HIGH (ref 0.0–1.2)
Total Protein: 6.1 g/dL — ABNORMAL LOW (ref 6.5–8.1)

## 2023-10-16 LAB — HEMOGLOBIN A1C
Hgb A1c MFr Bld: 4.7 % — ABNORMAL LOW (ref 4.8–5.6)
Mean Plasma Glucose: 88.19 mg/dL

## 2023-10-16 LAB — PROTIME-INR
INR: 1.3 — ABNORMAL HIGH (ref 0.8–1.2)
Prothrombin Time: 16.4 s — ABNORMAL HIGH (ref 11.4–15.2)

## 2023-10-16 LAB — FERRITIN: Ferritin: 53 ng/mL (ref 24–336)

## 2023-10-16 LAB — APTT: aPTT: 26 s (ref 24–36)

## 2023-10-16 LAB — MAGNESIUM: Magnesium: 2.3 mg/dL (ref 1.7–2.4)

## 2023-10-16 LAB — CBG MONITORING, ED: Glucose-Capillary: 102 mg/dL — ABNORMAL HIGH (ref 70–99)

## 2023-10-16 SURGERY — EGD (ESOPHAGOGASTRODUODENOSCOPY)
Anesthesia: Monitor Anesthesia Care | Laterality: Left

## 2023-10-16 MED ORDER — MELATONIN 3 MG PO TABS
3.0000 mg | ORAL_TABLET | Freq: Every evening | ORAL | Status: DC | PRN
  Administered 2023-10-16: 3 mg via ORAL
  Filled 2023-10-16: qty 1

## 2023-10-16 MED ORDER — PANTOPRAZOLE SODIUM 40 MG IV SOLR
40.0000 mg | Freq: Two times a day (BID) | INTRAVENOUS | Status: DC
  Administered 2023-10-16 – 2023-10-19 (×7): 40 mg via INTRAVENOUS
  Filled 2023-10-16 (×7): qty 10

## 2023-10-16 MED ORDER — INSULIN ASPART 100 UNIT/ML IJ SOLN
0.0000 [IU] | Freq: Three times a day (TID) | INTRAMUSCULAR | Status: DC
  Administered 2023-10-17 – 2023-10-18 (×3): 1 [IU] via SUBCUTANEOUS

## 2023-10-16 MED ORDER — ACETAMINOPHEN 325 MG PO TABS: 650.0000 mg | ORAL_TABLET | Freq: Four times a day (QID) | ORAL | Status: DC | PRN

## 2023-10-16 MED ORDER — INSULIN ASPART 100 UNIT/ML IJ SOLN
0.0000 [IU] | Freq: Three times a day (TID) | INTRAMUSCULAR | Status: DC
Start: 2023-10-16 — End: 2023-10-16

## 2023-10-16 MED ORDER — ACETAMINOPHEN 650 MG RE SUPP: 650.0000 mg | Freq: Four times a day (QID) | RECTAL | Status: DC | PRN

## 2023-10-16 MED ORDER — SODIUM CHLORIDE 0.9 % IV SOLN
50.0000 ug/h | INTRAVENOUS | Status: DC
  Administered 2023-10-16 – 2023-10-18 (×5): 50 ug/h via INTRAVENOUS
  Filled 2023-10-16 (×8): qty 1

## 2023-10-16 MED ORDER — HALOPERIDOL LACTATE 5 MG/ML IJ SOLN
1.0000 mg | Freq: Once | INTRAMUSCULAR | Status: AC
  Administered 2023-10-16: 1 mg via INTRAVENOUS
  Filled 2023-10-16: qty 1

## 2023-10-16 MED ORDER — ACETAMINOPHEN 325 MG PO TABS
650.0000 mg | ORAL_TABLET | ORAL | Status: AC
  Administered 2023-10-16 – 2023-10-18 (×8): 650 mg via ORAL
  Filled 2023-10-16 (×10): qty 2

## 2023-10-16 MED ORDER — LACTATED RINGERS IV BOLUS
1000.0000 mL | Freq: Once | INTRAVENOUS | Status: AC
  Administered 2023-10-16: 1000 mL via INTRAVENOUS

## 2023-10-16 MED ORDER — ONDANSETRON HCL 4 MG/2ML IJ SOLN: 4.0000 mg | Freq: Four times a day (QID) | INTRAMUSCULAR | Status: DC | PRN

## 2023-10-16 MED ORDER — SODIUM CHLORIDE 0.9 % IV SOLN: INTRAVENOUS | Status: DC | PRN

## 2023-10-16 MED ORDER — OXYCODONE HCL 5 MG PO TABS
2.5000 mg | ORAL_TABLET | ORAL | Status: DC | PRN
  Administered 2023-10-16: 2.5 mg via ORAL
  Filled 2023-10-16: qty 1

## 2023-10-16 MED ORDER — PROPOFOL 500 MG/50ML IV EMUL
INTRAVENOUS | Status: DC | PRN
  Administered 2023-10-16: 40 ug/kg/min via INTRAVENOUS

## 2023-10-16 MED ORDER — HALOPERIDOL LACTATE 5 MG/ML IJ SOLN
1.0000 mg | Freq: Four times a day (QID) | INTRAMUSCULAR | Status: DC | PRN
  Administered 2023-10-16 – 2023-10-17 (×2): 1 mg via INTRAVENOUS
  Filled 2023-10-16 (×2): qty 1

## 2023-10-16 MED ORDER — DAPAGLIFLOZIN PROPANEDIOL 5 MG PO TABS
5.0000 mg | ORAL_TABLET | Freq: Every day | ORAL | Status: DC
  Administered 2023-10-17 – 2023-10-19 (×3): 5 mg via ORAL
  Filled 2023-10-16 (×4): qty 1

## 2023-10-16 MED ORDER — INSULIN ASPART 100 UNIT/ML IJ SOLN: 0.0000 [IU] | Freq: Every day | INTRAMUSCULAR | Status: DC

## 2023-10-16 MED ORDER — THYROID 60 MG PO TABS
120.0000 mg | ORAL_TABLET | Freq: Every day | ORAL | Status: DC
  Administered 2023-10-17 – 2023-10-19 (×3): 120 mg via ORAL
  Filled 2023-10-16 (×3): qty 2

## 2023-10-16 MED ORDER — SENNOSIDES-DOCUSATE SODIUM 8.6-50 MG PO TABS
2.0000 | ORAL_TABLET | Freq: Two times a day (BID) | ORAL | Status: DC
  Administered 2023-10-16 – 2023-10-19 (×4): 2 via ORAL
  Filled 2023-10-16 (×6): qty 2

## 2023-10-16 MED ORDER — PHENYLEPHRINE 80 MCG/ML (10ML) SYRINGE FOR IV PUSH (FOR BLOOD PRESSURE SUPPORT)
PREFILLED_SYRINGE | INTRAVENOUS | Status: DC | PRN
Start: 2023-10-16 — End: 2023-10-16
  Administered 2023-10-16 (×3): 160 ug via INTRAVENOUS
  Administered 2023-10-16: 80 ug via INTRAVENOUS
  Administered 2023-10-16: 160 ug via INTRAVENOUS

## 2023-10-16 MED ORDER — PROPOFOL 10 MG/ML IV BOLUS
INTRAVENOUS | Status: DC | PRN
  Administered 2023-10-16: 10 mg via INTRAVENOUS

## 2023-10-16 MED ORDER — SODIUM CHLORIDE 0.9 % IV SOLN: INTRAVENOUS | Status: AC

## 2023-10-16 NOTE — Progress Notes (Signed)
  Carryover admission to the Day Admitter.  I discussed this case with the EDP, Maxwell Marion, PA .  Per these discussions:   This is a 89 year old male who has been admitted with acute upper gastrointestinal bleed after presenting with 2 episodes of hematemesis at home over the course of the last day, as well as a single episode of hematemesis upon initial arrival in the emergency department this evening.  Has not gone hours without any additional hematemesis.  He is otherwise asymptomatic.  Not on a blood thinners, nor any recent use of NSAIDs.  No history of alcohol abuse or any known history of chronic liver disease.  He is otherwise asymptomatic.  Vital signs in the ED today notable for the following: Afebrile; heart rates in the 80s to 90s; stop blood pressures in the low 100s to 120s.   Presenting hemoglobin 11.6 compared to 14 on 07/21/2023.  EDP d/w on-call [facility], Dr. Doy Hutching, who conveyed that [facility] will formally consult and see pt in the AM.   He has received Protonix 80 mg IV x 1 dose.  Type and screen has been ordered and EDP is placed orders for transfusion of 1 unit PRBC.  I have placed an order for observation to PCU for further evaluation management of the above.  I have placed some additional preliminary admit orders via the adult multi-morbid admission order set. I have also ordered n.p.o., Protonix 40 mg IV twice daily, normal saline at 75 cc/h x 12 hours, with instructions to hold these IV fluids while undergoing PRBC transfusion.  Have ordered morning labs in the form of CMP, CBC, and magnesium level.  Also ordered acute 4-hour H&H checks through 1300 today and have added on iron studies as well as PTT/INR to pretransfusion specimen.    Newton Pigg, DO Hospitalist

## 2023-10-16 NOTE — Consult Note (Signed)
 [facility] Consultation Note  Referring Provider: [facility] Primary Care Physician:  Pearline Cables, MD Primary Gastroenterologist:  [facility]  Reason for Consultation:  blood in stool, hematemesis  HPI: [name] is a 89 y.o. male admitted abdominal pain, hematemesis, melena.  Had endoscopy 2018 for melena showing gastric erosions otherwise unrevealing.   Past Medical History:  Diagnosis Date   Anginal pain (HCC)    Anxiety    " OCCASIONAL"   Arthritis    Asthma due to seasonal allergies    uses inhalers prn   Carotid artery occlusion    Left   Complication of anesthesia    " had tremors after prostate surgery   Coronary artery disease    Diverticulitis    recurrent   Family history of anesthesia complication    MOTHER    GERD (gastroesophageal reflux disease)    H/O hiatal hernia    Hearing aid worn    Hyperlipemia    Hypertension    Kidney stone    lithotripsy 2011 w complications, req stents, Dr Earlene Plater   Prostate CA [facility])    Shortness of breath    Thyroid disease    TIA (transient ischemic attack)    Tinnitus     Past Surgical History:  Procedure Laterality Date   CARDIAC CATHETERIZATION  2008   minimal dz, Dr Melburn Popper   CARDIAC CATHETERIZATION N/A 10/09/2015   Procedure: Left Heart Cath and Coronary Angiography;  Surgeon: Peter M Swaziland, MD;  Location: [facility] INVASIVE CV LAB;  Service: Cardiovascular;  Laterality: N/A;   CARDIAC CATHETERIZATION N/A 10/09/2015   Procedure: Intravascular Pressure Wire/FFR Study;  Surgeon: Peter M Swaziland, MD;  Location: [facility] INVASIVE CV LAB;  Service: Cardiovascular;  Laterality: N/A;   CARDIAC CATHETERIZATION N/A 10/09/2015   Procedure: Coronary Stent Intervention;  Surgeon: Peter M Swaziland, MD;  Location: [facility] INVASIVE CV LAB;  Service: Cardiovascular;  Laterality: N/A;   CAROTID ENDARTERECTOMY  Left   1998   CHOLECYSTECTOMY     CORONARY STENT PLACEMENT  10/09/2015   DES to RCA   ESOPHAGOGASTRODUODENOSCOPY  (EGD) WITH PROPOFOL Left 03/26/2017   Procedure: ESOPHAGOGASTRODUODENOSCOPY (EGD) WITH PROPOFOL;  Surgeon: Willis Modena, MD;  Location: [facility] ENDOSCOPY;  Service: Endoscopy;  Laterality: Left;   LEFT HEART CATHETERIZATION WITH CORONARY ANGIOGRAM N/A 09/15/2013   Procedure: LEFT HEART CATHETERIZATION WITH CORONARY ANGIOGRAM;  Surgeon: Peter M Swaziland, MD;  Location: [facility] CATH LAB;  Service: Cardiovascular;  Laterality: N/A;   PACEMAKER IMPLANT N/A 08/12/2022   Procedure: PACEMAKER IMPLANT;  Surgeon: Regan Lemming, MD;  Location: [facility] INVASIVE CV LAB;  Service: Cardiovascular;  Laterality: N/A;   PROSTATECTOMY  1998   radical for prostate cancer   TEMPORARY PACEMAKER N/A 08/11/2022   Procedure: TEMPORARY PACEMAKER;  Surgeon: Yates Decamp, MD;  Location: [facility] INVASIVE CV LAB;  Service: Cardiovascular;  Laterality: N/A;    Prior to Admission medications   Medication Sig Start Date End Date Taking? Authorizing Provider  acetaminophen (TYLENOL) 500 MG tablet Take 500 mg by mouth every 6 (six) hours as needed for moderate pain (pain score 4-6).   Yes [provider]  aspirin EC 81 MG tablet Take 81 mg by mouth daily.   Yes [provider]  dapagliflozin propanediol (FARXIGA) 5 MG TABS tablet Take 1 tablet (5 mg total) by mouth daily. 09/03/23  Yes Copland, Gwenlyn Found, MD  guaiFENesin-dextromethorphan (ROBITUSSIN DM) 100-10 MG/5ML syrup Take 10 mLs by mouth every 4 (four) hours as needed for cough (  chest congestion). 07/21/23  Yes Leatha Gilding, MD  IBUPROFEN PO Take 2 tablets by mouth daily as needed.   Yes [provider]  NP THYROID 120 MG tablet TAKE ONE TABLET BY MOUTH ONE TIME DAILY BEFORE BREAKFAST 09/07/23  Yes Copland, Gwenlyn Found, MD  blood glucose meter kit and supplies KIT Dispense based on patient and insurance preference. Use up to four times daily as directed. 09/10/20   Joseph Art, DO    Current Facility-Administered Medications  Medication Dose Route Frequency  Provider Last Rate Last Admin   [MAR Hold] 0.9 %  sodium chloride infusion (Manually program via Guardrails IV Fluids)   Intravenous Once Nolberto Hanlon, MD       0.9 %  sodium chloride infusion   Intravenous Continuous Nolberto Hanlon, MD 10 mL/hr at 10/16/23 1224 New Bag at 10/16/23 1224   [MAR Hold] acetaminophen (TYLENOL) tablet 650 mg  650 mg Oral Q4H Nolberto Hanlon, MD       Mitzi Hansen Hold] dapagliflozin propanediol (FARXIGA) tablet 5 mg  5 mg Oral Daily Nolberto Hanlon, MD       Mitzi Hansen Hold] insulin aspart (novoLOG) injection 0-5 Units  0-5 Units Subcutaneous QHS Nolberto Hanlon, MD       [facility] Hold] insulin aspart (novoLOG) injection 0-9 Units  0-9 Units Subcutaneous TID WC Nolberto Hanlon, MD       Mitzi Hansen Hold] melatonin tablet 3 mg  3 mg Oral QHS PRN Nolberto Hanlon, MD       Mitzi Hansen Hold] ondansetron [facility]) injection 4 mg  4 mg Intravenous Q6H PRN Nolberto Hanlon, MD       [facility] Hold] oxyCODONE (Oxy IR/ROXICODONE) immediate release tablet 2.5 mg  2.5 mg Oral Q4H PRN Nolberto Hanlon, MD       Mitzi Hansen Hold] pantoprazole (PROTONIX) injection 40 mg  40 mg Intravenous Conchita Paris, MD   40 mg at 10/16/23 0754   [MAR Hold] senna-docusate (Senokot-S) tablet 2 tablet  2 tablet Oral BID Nolberto Hanlon, MD       [facility] Hold] thyroid (ARMOUR) tablet 120 mg  120 mg Oral QAC breakfast Nolberto Hanlon, MD        Allergies as of 10/15/2023 - Review Complete 10/15/2023  Allergen Reaction Noted   Ciprofloxacin Other (See Comments) 07/19/2014   Codeine Other (See Comments) 07/19/2014   Flexeril [cyclobenzaprine] Other (See Comments) 09/25/2015   Imdur [isosorbide nitrate] Other (See Comments) 06/02/2018   Methocarbamol Other (See Comments) 09/25/2015   Other Other (See Comments) 09/15/2013   Strawberry extract Other (See Comments) 02/07/2019   Sulfa antibiotics Other (See Comments) 11/05/2015   Sulfamethoxazole Other (See Comments) 01/27/2012   Sulfonamide derivatives Other (See Comments)    Tomato Other (See Comments) 02/07/2019   Flagyl  [metronidazole] Rash 08/09/2015    Family History  Problem Relation Age of Onset   Heart disease Mother    Heart disease Father    Kidney failure Father    Heart disease Sister    Heart attack Sister    Dementia Sister    Sjogren's syndrome Child    Hashimoto's thyroiditis Child    CAD Child     Social History   Socioeconomic History   Marital status: Married    Spouse name: Not on file   Number of children: Not on file   Years of education: Not on file   Highest education level: Not on file  Occupational History   Occupation: retired    Comment: Charity fundraiser [employer]  Tobacco Use   Smoking status: Former    Current packs/day: 0.00    Average packs/day: 2.5 packs/day for 30.0 years (75.0 ttl pk-yrs)    Types: Cigarettes    Start date: 08/29/1955    Quit date: 08/28/1985    Years since quitting: 38.1   Smokeless tobacco: Never  Substance and Sexual Activity   Alcohol use: Yes    Alcohol/week: 2.0 standard drinks of alcohol    Types: 2 Standard drinks or equivalent per week    Comment: 1 glass wine/week (prior 1 glass martini with dinner)   Drug use: No   Sexual activity: Yes    Birth control/protection: None  Other Topics Concern   Not on file  Social History Narrative   Lives with wife--recently diagnosed with breast ca; No smoking; Occassional wine; Retired; 2 daughters live in [place]--5 grandchildren(boys). On weight watchers. Lives in [place] with wife. Retired Animal nutritionist. Tobacco history 2 ppd x 32 years. No smoking x 25 years. No drugs.   Social Drivers of Corporate investment banker Strain: Low Risk  (09/18/2019)   Overall Financial Resource Strain (CARDIA)    Difficulty of Paying Living Expenses: Not hard at all  Food Insecurity: No Food Insecurity (07/28/2023)   Hunger Vital Sign    Worried About Running Out of Food in the Last Year: Never true    Ran Out of Food in the Last Year: Never true  Transportation Needs: No Transportation  Needs (07/28/2023)   PRAPARE - Administrator, Civil Service (Medical): No    Lack of Transportation (Non-Medical): No  Physical Activity: Inactive (01/12/2022)   Exercise Vital Sign    Days of Exercise per Week: 0 days    Minutes of Exercise per Session: 0 min  Stress: No Stress Concern Present (01/12/2022)   [employer] of Occupational Health - Occupational Stress Questionnaire    Feeling of Stress : Not at all  Social Connections: Moderately Integrated (07/20/2023)   Social Connection and Isolation Panel [NHANES]    Frequency of Communication with Friends and Family: Three times a week    Frequency of Social Gatherings with Friends and Family: Twice a week    Attends Religious Services: More than 4 times per year    Active Member of Clubs or Organizations: No    Attends Banker Meetings: Patient declined    Marital Status: Married  Catering manager Violence: Patient Unable To Answer (07/28/2023)   Humiliation, Afraid, Rape, and Kick questionnaire    Fear of Current or Ex-Partner: Patient unable to answer    Emotionally Abused: Patient unable to answer    Physically Abused: Patient unable to answer    Sexually Abused: Patient unable to answer    Review of Systems: As per HPI, all others negative  Physical Exam: Vital signs in last 24 hours: Temp:  [97.6 F (36.4 C)-98.6 F (37 C)] 97.9 F (36.6 C) (04/05 1218) Pulse Rate:  [84-104] 102 (04/05 1218) Resp:  [13-29] 19 (04/05 1218) BP: (74-141)/(44-85) 141/69 (04/05 1218) SpO2:  [96 %-99 %] 96 % (04/05 1218) Weight:  [88 kg] 88 kg (04/05 1218)   General:   Alert, intermittently confused, elderly Head:  Normocephalic and atraumatic. Eyes:  Sclera clear, no icterus.   Conjunctiva pale Ears:  Normal auditory acuity. Nose:  No deformity, discharge,  or lesions. Mouth:  No deformity or lesions.  Oropharynx pale and dry Neck:  Supple; no masses or thyromegaly. Lungs:  No visible  respiratory  distress Abdomen:  Soft, nontender and nondistended. No masses, hepatosplenomegaly or hernias noted. No peritonitis   Msk:  Symmetrical without gross deformities. Normal posture. Pulses:  Normal pulses noted. Extremities:  Without clubbing or edema. Neurologic:  Alert and  oriented x4; chronic dementia, otherwise grossly normal neurologically. Skin:  scattered ecchymoses, otherwise Intact without significant lesions or rashes. Psych:  Alert and cooperative. Normal mood and affect.   Lab Results: Recent Labs    10/15/23 2212 10/16/23 0629 10/16/23 0900  WBC 10.1 7.3  --   HGB 11.6* 11.1* 11.1*  HCT 34.1* 32.0* 32.0*  PLT 118* 93*  --    BMET Recent Labs    10/15/23 2212 10/16/23 0629  NA 137 141  K 4.8 5.0  CL 105 108  CO2 21* 21*  GLUCOSE 140* 122*  BUN 33* 52*  CREATININE 1.73* 1.94*  CALCIUM 8.9 9.3   LFT Recent Labs    10/16/23 0629  PROT 6.1*  ALBUMIN 3.1*  AST 41  ALT 28  ALKPHOS 62  BILITOT 2.8*   PT/INR Recent Labs    10/16/23 0629  LABPROT 16.4*  INR 1.3*    Studies/Results: CT ABDOMEN PELVIS W CONTRAST Result Date: 10/15/2023 CLINICAL DATA:  Acute abdominal pain EXAM: CT ABDOMEN AND PELVIS WITH CONTRAST TECHNIQUE: Multidetector CT imaging of the abdomen and pelvis was performed using the standard protocol following bolus administration of intravenous contrast. RADIATION DOSE REDUCTION: This exam was performed according to the departmental dose-optimization program which includes automated exposure control, adjustment of the mA and/or kV according to patient size and/or use of iterative reconstruction technique. CONTRAST:  75mL OMNIPAQUE IOHEXOL 350 MG/ML SOLN COMPARISON:  08/10/2022 FINDINGS: Lower chest: No acute abnormality. Hepatobiliary: Gallbladder has been surgically removed. Left hepatic cyst is noted. Mild nodularity of the liver is seen. Pancreas: Unremarkable. No pancreatic ductal dilatation or surrounding inflammatory changes. Spleen: Normal  in size without focal abnormality. Adrenals/Urinary Tract: Adrenal glands are within normal limits. Kidneys show the left kidney have a normal enhancement pattern. No calculi are seen. Right kidney is atrophic with a stable 8 mm nonobstructing stone. The ureters are within normal limits. The bladder is partially distended. Stomach/Bowel: Scattered diverticular change of the colon is noted without evidence of diverticulitis. The appendix is within normal limits. Small bowel and stomach are unremarkable. Vascular/Lymphatic: Aortic atherosclerosis. No enlarged abdominal or pelvic lymph nodes. Reproductive: Prostate has been surgically removed. Other: No abdominal wall hernia or abnormality. No abdominopelvic ascites. Musculoskeletal: No acute or significant osseous findings. Chronic L1 compression deformity is noted. IMPRESSION: Diverticulosis without diverticulitis. Atrophic right kidney. No acute abnormality noted. Electronically Signed   By: Alcide Clever M.D.   On: 10/15/2023 23:56   CT Head Wo Contrast Result Date: 10/15/2023 CLINICAL DATA:  TIA EXAM: CT HEAD WITHOUT CONTRAST TECHNIQUE: Contiguous axial images were obtained from the base of the skull through the vertex without intravenous contrast. RADIATION DOSE REDUCTION: This exam was performed according to the departmental dose-optimization program which includes automated exposure control, adjustment of the mA and/or kV according to patient size and/or use of iterative reconstruction technique. COMPARISON:  None Available. FINDINGS: Brain: No evidence of acute infarction, hemorrhage, hydrocephalus, extra-axial collection or mass lesion/mass effect. There is moderate diffuse atrophy. There is mild periventricular white matter hypodensity, likely chronic small vessel ischemic change. Vascular: Atherosclerotic calcifications are present within the cavernous internal carotid arteries. Skull: Normal. Negative for fracture or focal lesion. Sinuses/Orbits: No acute  finding. Other: None. IMPRESSION: 1. No  acute intracranial process. Moderate diffuse atrophy. Mild chronic small vessel ischemic change. Electronically Signed   By: Darliss Cheney M.D.   On: 10/15/2023 23:51    Impression:   Hematemesis and melena. Upper abdominal pain.  Plan:   PPI + volume support. NPO. Endoscopy today. Risks (bleeding, infection, bowel perforation that could require surgery, sedation-related changes in cardiopulmonary systems), benefits (identification and possible treatment of source of symptoms, exclusion of certain causes of symptoms), and alternatives (watchful waiting, radiographic imaging studies, empiric medical treatment) of upper endoscopy (EGD) were explained to patient/family in detail and patient wishes to proceed.    LOS: 0 days   Lujain Kraszewski M  10/16/2023, 12:25 PM  Cell [phone] If no answer or after 5 PM call [phone]

## 2023-10-16 NOTE — Anesthesia Preprocedure Evaluation (Addendum)
 Anesthesia Evaluation  Patient identified by MRN, date of birth, ID band Patient awake    Reviewed: Allergy & Precautions, NPO status , Patient's Chart, lab work & pertinent test results  Airway Mallampati: II  TM Distance: >3 FB Neck ROM: Full    Dental  (+) Dental Advisory Given   Pulmonary shortness of breath, asthma , sleep apnea , former smoker   breath sounds clear to auscultation       Cardiovascular hypertension, + CAD  + dysrhythmias Atrial Fibrillation + pacemaker + Valvular Problems/Murmurs AS  Rhythm:Regular Rate:Normal  IMPRESSIONS     1. Left ventricular ejection fraction, by estimation, is 55 to 60%. The  left ventricle has normal function. The left ventricle has no regional  wall motion abnormalities. There is moderate concentric left ventricular  hypertrophy. Indeterminate diastolic  filling due to E-A fusion.   2. Right ventricular systolic function is normal. The right ventricular  size is normal. Tricuspid regurgitation signal is inadequate for assessing  PA pressure.   3. The mitral valve is degenerative. No evidence of mitral valve  regurgitation. Mild mitral stenosis. The mean mitral valve gradient is 4.2  mmHg with average heart rate of 87 bpm. Severe mitral annular  calcification.   4. The aortic valve is tricuspid. There is severe calcifcation of the  aortic valve. There is severe thickening of the aortic valve. Aortic valve  regurgitation is not visualized. Moderate aortic valve stenosis. Aortic  valve area, by VTI measures 1.08  cm. Aortic valve mean gradient measures 17.0 mmHg. Aortic valve Vmax  measures 2.67 m/s.      Neuro/Psych TIA Neuromuscular disease    GI/Hepatic Neg liver ROS, hiatal hernia,GERD  ,,  Endo/Other  diabetesHypothyroidism    Renal/GU CRFRenal disease     Musculoskeletal  (+) Arthritis ,    Abdominal   Peds  Hematology  (+) Blood dyscrasia, anemia    Anesthesia Other Findings   Reproductive/Obstetrics                             Anesthesia Physical Anesthesia Plan  ASA: 3  Anesthesia Plan: MAC   Post-op Pain Management:    Induction:   PONV Risk Score and Plan: 1 and Propofol infusion  Airway Management Planned: Natural Airway and Nasal Cannula  Additional Equipment:   Intra-op Plan:   Post-operative Plan:   Informed Consent: I have reviewed the patients History and Physical, chart, labs and discussed the procedure including the risks, benefits and alternatives for the proposed anesthesia with the patient or authorized representative who has indicated his/her understanding and acceptance.       Plan Discussed with:   Anesthesia Plan Comments:        Anesthesia Quick Evaluation

## 2023-10-16 NOTE — ED Notes (Addendum)
 Pt vomited approx 25 mL of dark red blood. I notified Dr Dulce Sellar, GI. No new orders at this time

## 2023-10-16 NOTE — Plan of Care (Signed)

## 2023-10-16 NOTE — ED Notes (Signed)
 Patient getting increasingly agitated and is not redirectable, admitting MD notified see new orders.

## 2023-10-16 NOTE — Plan of Care (Signed)

## 2023-10-16 NOTE — Op Note (Signed)
 [facility] Patient Name: [name] Procedure Date : 10/16/2023 MRN: [id number] Attending MD: Willis Modena , MD, [id number] Date of Birth: [birth year] CSN: [id number] Age: 89 Admit Type: Inpatient Procedure:                Upper GI endoscopy Indications:              Acute post hemorrhagic anemia, Hematemesis, Melena Providers:                Willis Modena, MD, Lorenza Evangelist, RN, Rozetta Nunnery, Technician Referring MD:             [facility] Medicines:                Monitored Anesthesia Care Complications:            No immediate complications. Estimated Blood Loss:     Estimated blood loss: none. Procedure:                Pre-Anesthesia Assessment:                           - Prior to the procedure, a History and Physical                            was performed, and patient medications and                            allergies were reviewed. The patient's tolerance of                            previous anesthesia was also reviewed. The risks                            and benefits of the procedure and the sedation                            options and risks were discussed with the patient.                            All questions were answered, and informed consent                            was obtained. Prior Anticoagulants: The patient has                            taken no anticoagulant or antiplatelet agents                            except for aspirin. ASA Grade Assessment: III - A                            patient with severe systemic disease. After  reviewing the risks and benefits, the patient was                            deemed in satisfactory condition to undergo the                            procedure.                           After obtaining informed consent, the endoscope was                            passed under direct vision. Throughout the                             procedure, the patient's blood pressure, pulse, and                            oxygen saturations were monitored continuously. The                            [id number]-[id number] ([id number]) Olympus endoscope was introduced                            through the mouth, and advanced to the second part                            of duodenum. The upper GI endoscopy was                            accomplished without difficulty. The patient                            tolerated the procedure well. Scope In: Scope Out: Findings:      Three columns of grade II varices were found in the lower third of the       esophagus. Red wale signs were seen. They were large in size. Three       bands were successfully placed with complete eradication, resulting in       deflation of varices. There was no bleeding during and at the end of the       procedure.      The exam of the esophagus was otherwise normal.      Clotted blood was found in the cardia, in the gastric fundus and in the       gastric body. Views of proximal stomach were accordingly compromised.      The exam of the stomach was otherwise normal.      The duodenal bulb, first portion of the duodenum and second portion of       the duodenum were normal. Impression:               - Grade II esophageal varices with red wale signs.                            Highly likely source of hematemesis. Three bands  placed.                           - Clotted blood in the cardia, in the gastric                            fundus and in the gastric body. Views of proximal                            stomach obscured; can't rule out gastric varices or                            other proximal gastric source of bleeding.                           - Normal duodenal bulb, first portion of the                            duodenum and second portion of the duodenum.                           - No specimens collected. Recommendation:           - Return  patient to hospital ward for ongoing care.                           - Clear liquid diet today.                           - Continue present medications.                           - PPI IV Q12 hours.                           - Octreotide drip.                           - Follow CBC; transfuse as needed.                           - After recovery from this acute illness, we'll                            need to explore causes of his esophageal varices                            (cirrhosis most likely but no splenomegaly or                            portal hypertensive findings were seen on his                            recent CT scan).                           -  [facility] will follow. Procedure Code(s):        --- Professional ---                           [phone], Esophagogastroduodenoscopy, flexible,                            transoral; with band ligation of esophageal/gastric                            varices Diagnosis Code(s):        --- Professional ---                           I85.00, Esophageal varices without bleeding                           K92.2, Gastrointestinal hemorrhage, unspecified                           D62, Acute posthemorrhagic anemia                           K92.0, Hematemesis                           K92.1, Melena (includes Hematochezia) CPT copyright 2022 American Medical Association. All rights reserved. The codes documented in this report are preliminary and upon coder review may  be revised to meet current compliance requirements. Willis Modena, MD 10/16/2023 1:44:05 PM This report has been signed electronically. Number of Addenda: 0

## 2023-10-16 NOTE — Care Management Obs Status (Signed)
 MEDICARE OBSERVATION STATUS NOTIFICATION   Patient Details  Name: [name] MRN: [id number] Date of Birth: [birth year]   Medicare Observation Status Notification Given:  Yes    Lockie Pares, RN 10/16/2023, 12:02 PM

## 2023-10-16 NOTE — Anesthesia Postprocedure Evaluation (Signed)
 Anesthesia Post Note  Patient: [name]  Procedure(s) Performed: EGD (ESOPHAGOGASTRODUODENOSCOPY) (Left) ESOPHAGOSCOPY, WITH ESOPHAGEAL VARICES BAND LIGATION     Patient location during evaluation: PACU Anesthesia Type: MAC Level of consciousness: awake and alert Pain management: pain level controlled Vital Signs Assessment: post-procedure vital signs reviewed and stable Respiratory status: spontaneous breathing, nonlabored ventilation, respiratory function stable and patient connected to nasal cannula oxygen Cardiovascular status: stable and blood pressure returned to baseline Postop Assessment: no apparent nausea or vomiting Anesthetic complications: no  No notable events documented.  Last Vitals:  Vitals:   10/16/23 1635 10/16/23 2055  BP: 129/61   Pulse: 95   Resp: 18 20  Temp: 36.7 C 37.2 C  SpO2: 90%     Last Pain:  Vitals:   10/16/23 2055  TempSrc: Axillary  PainSc:                  Kennieth Rad

## 2023-10-16 NOTE — Assessment & Plan Note (Addendum)
 See hpi. No episodes evident today. Presenting initially as presyncope and hypotension.  With blood pressure improved with running IV fluids.  However given patient's marked thirst and persistent diastolic blood pressure under 60 at this time I will give him a liter of LR bolus over the next couple of hours.  Continue with 75 cc/h of normal saline infusion.  Type and screen has been sent.  Continue with pantoprazole 40 mg IV twice daily.  GI has been engaged and patient has been added to the list.  Look forward to their evaluation.  They have put in n.p.o. status ordered for the patient till around noon after which diet may be started clear liquid if patient without vomiting INR and PTT are not actionable.  Thrombocytopenia chronic and not at transfusion threshold given current clinical picture I will check an H&H at approximately 1 PM.

## 2023-10-16 NOTE — Interval H&P Note (Signed)
 History and Physical Interval Note:  10/16/2023 12:25 PM  [name]  has presented today for surgery, with the diagnosis of hematemesis, melena, anemia, abdominal pain..  The various methods of treatment have been discussed with the patient and family. After consideration of risks, benefits and other options for treatment, the patient has consented to  Procedure(s): EGD (ESOPHAGOGASTRODUODENOSCOPY) (Left) as a surgical intervention.  The patient's history has been reviewed, patient examined, no change in status, stable for surgery.  I have reviewed the patient's chart and labs.  Questions were answered to the patient's satisfaction.     [name]

## 2023-10-16 NOTE — H&P (Addendum)
 History and Physical    Patient: [name]:[id number] DOB: 1934-06-16 DOA: 10/15/2023 DOS: the patient was seen and examined on 10/16/2023 PCP: Copland, Gwenlyn Found, MD  Patient coming from: Home  Chief Complaint:  Chief Complaint  Patient presents with   Hematemesis   HPI: [name] is a 89 y.o. male with medical history significant of dementia as per wife at the bedside.  History is principally obtained from patient's wife at the bedside patient is also chronically hard of hearing.  Other medical conditions are as listed below.  Patient does have prior known atrial fibrillation however is not on anticoagulation.  Patient seems to have been in his usual state of health till late last evening when he seems to have called out his wife for help.  When patient was seen by wife he was noted to be somewhat fatigued/pale leaning to 1 side and more confused than usual.  Patient was subsequently noted to cough up/vomit out a little bit of blood by wife.  However patient regained his strength in the next several minutes.  Patient did not lose consciousness.  EMS was called.  EMS record indicates initial blood pressure of 80/30 mmHg.  Subsequently improving 221/48.   and patient was brought to the ER.   Initial blood pressure in the ER seems to have been 74/45.  Patient vomited at least 1 copious amount of bloody emesis after arrival to the ER.  No report of diarrhea or further episodes of vomiting.  Patient has been treated with the running IV fluids since then.  Patient reports feeling thirsty.  Again no history of diarrhea abdominal pain fever or rigors.  Medical evaluation is sought  Review of Systems: As mentioned in the history of present illness. All other systems reviewed and are negative. Past Medical History:  Diagnosis Date   Anginal pain (HCC)    Anxiety    " OCCASIONAL"   Arthritis    Asthma due to seasonal allergies    uses inhalers prn   Carotid artery occlusion    Left    Complication of anesthesia    " had tremors after prostate surgery   Coronary artery disease    Diverticulitis    recurrent   Family history of anesthesia complication    MOTHER    GERD (gastroesophageal reflux disease)    H/O hiatal hernia    Hearing aid worn    Hyperlipemia    Hypertension    Kidney stone    lithotripsy 2011 w complications, req stents, Dr Earlene Plater   Prostate CA [facility])    Shortness of breath    Thyroid disease    TIA (transient ischemic attack)    Tinnitus    Past Surgical History:  Procedure Laterality Date   CARDIAC CATHETERIZATION  2008   minimal dz, Dr Melburn Popper   CARDIAC CATHETERIZATION N/A 10/09/2015   Procedure: Left Heart Cath and Coronary Angiography;  Surgeon: Peter M Swaziland, MD;  Location: [facility] INVASIVE CV LAB;  Service: Cardiovascular;  Laterality: N/A;   CARDIAC CATHETERIZATION N/A 10/09/2015   Procedure: Intravascular Pressure Wire/FFR Study;  Surgeon: Peter M Swaziland, MD;  Location: [facility] INVASIVE CV LAB;  Service: Cardiovascular;  Laterality: N/A;   CARDIAC CATHETERIZATION N/A 10/09/2015   Procedure: Coronary Stent Intervention;  Surgeon: Peter M Swaziland, MD;  Location: [facility] INVASIVE CV LAB;  Service: Cardiovascular;  Laterality: N/A;   CAROTID ENDARTERECTOMY  Left   1998   CHOLECYSTECTOMY     CORONARY STENT PLACEMENT  10/09/2015   DES to RCA   ESOPHAGOGASTRODUODENOSCOPY (EGD) WITH PROPOFOL Left 03/26/2017   Procedure: ESOPHAGOGASTRODUODENOSCOPY (EGD) WITH PROPOFOL;  Surgeon: Willis Modena, MD;  Location: [facility] ENDOSCOPY;  Service: Endoscopy;  Laterality: Left;   LEFT HEART CATHETERIZATION WITH CORONARY ANGIOGRAM N/A 09/15/2013   Procedure: LEFT HEART CATHETERIZATION WITH CORONARY ANGIOGRAM;  Surgeon: Peter M Swaziland, MD;  Location: [facility] CATH LAB;  Service: Cardiovascular;  Laterality: N/A;   PACEMAKER IMPLANT N/A 08/12/2022   Procedure: PACEMAKER IMPLANT;  Surgeon: Regan Lemming, MD;  Location: [facility] INVASIVE CV LAB;  Service: Cardiovascular;  Laterality: N/A;    PROSTATECTOMY  1998   radical for prostate cancer   TEMPORARY PACEMAKER N/A 08/11/2022   Procedure: TEMPORARY PACEMAKER;  Surgeon: Yates Decamp, MD;  Location: [facility] INVASIVE CV LAB;  Service: Cardiovascular;  Laterality: N/A;   Social History:  reports that he quit smoking about 38 years ago. His smoking use included cigarettes. He started smoking about 68 years ago. He has a 75 pack-year smoking history. He has never used smokeless tobacco. He reports current alcohol use of about 2.0 standard drinks of alcohol per week. He reports that he does not use drugs.  Allergies  Allergen Reactions   Ciprofloxacin Other (See Comments)    Hallucinations or jitteriness   Codeine Other (See Comments)    Hallucinations    Flexeril [Cyclobenzaprine] Other (See Comments)    "Made me feel goofy"   Imdur [Isosorbide Nitrate] Other (See Comments)    Caused headaches   Methocarbamol Other (See Comments)    "Made me feel goofy"   Other Other (See Comments)    CANNOT HAVE ANY FOODS WITH SEEDS    Strawberry Extract Other (See Comments)    CANNOT HAVE ANY FOODS WITH SEEDS   Sulfa Antibiotics Other (See Comments)    Chills and shaking- "serum sickness"    Sulfamethoxazole Other (See Comments)    Chills and shaking- "serum sickness"   Sulfonamide Derivatives Other (See Comments)    Chills and shaking "serum sickness"   Tomato Other (See Comments)    CANNOT HAVE ANY FOODS WITH SEEDS   Flagyl [Metronidazole] Rash    Family History  Problem Relation Age of Onset   Heart disease Mother    Heart disease Father    Kidney failure Father    Heart disease Sister    Heart attack Sister    Dementia Sister    Sjogren's syndrome Child    Hashimoto's thyroiditis Child    CAD Child     Prior to Admission medications   Medication Sig Start Date End Date Taking? Authorizing Provider  acetaminophen (TYLENOL) 500 MG tablet Take 500 mg by mouth every 6 (six) hours as needed for moderate pain (pain score 4-6).    Yes [provider]  aspirin EC 81 MG tablet Take 81 mg by mouth daily.   Yes [provider]  dapagliflozin propanediol (FARXIGA) 5 MG TABS tablet Take 1 tablet (5 mg total) by mouth daily. 09/03/23  Yes Copland, Gwenlyn Found, MD  guaiFENesin-dextromethorphan (ROBITUSSIN DM) 100-10 MG/5ML syrup Take 10 mLs by mouth every 4 (four) hours as needed for cough (chest congestion). 07/21/23  Yes Leatha Gilding, MD  IBUPROFEN PO Take 2 tablets by mouth daily as needed.   Yes [provider]  NP THYROID 120 MG tablet TAKE ONE TABLET BY MOUTH ONE TIME DAILY BEFORE BREAKFAST 09/07/23  Yes Copland, Gwenlyn Found, MD  blood glucose meter kit and supplies KIT Dispense based on  patient and insurance preference. Use up to four times daily as directed. 09/10/20   Joseph Art, DO    Physical Exam: Vitals:   10/16/23 0800 10/16/23 0900 10/16/23 1000 10/16/23 1009  BP: 125/73 129/60 (!) 117/55   Pulse: (!) 102 99 100   Resp: 20 19 19    Temp:    98.2 F (36.8 C)  TempSrc:    Oral  SpO2: 97% 98% 96%   Weight:      Height:       General: Patient is alert and awake, cooperative.  Hard of hearing, but able to give a detailed account of his symptoms in the last 24 hours.  Does not appear to be distressed. Respiratory exam: Bilateral intravesicular Cardiovascular exam S1-S2 normal with systolic murmur Abdomen bowel sounds normal all quadrants soft nontender Extremities warm without edema no focal motor deficit. Data Reviewed:  Labs on Admission:  Results for orders placed or performed during the hospital encounter of 10/15/23 (from the past 24 hours)  ABO/Rh     Status: None   Collection Time: 10/15/23  9:20 PM  Result Value Ref Range   ABO/RH(D)      O POS Performed at [facility], [address], [place], [place] [address]   Type and screen [facility]     Status: None (Preliminary result)   Collection Time: 10/15/23  9:30 PM  Result Value Ref Range    ABO/RH(D) O POS    Antibody Screen NEG    Sample Expiration 10/18/2023,2359    Unit Number [id number]    Blood Component Type RBC LR PHER1    Unit division 00    Status of Unit ISSUED    Transfusion Status OK TO TRANSFUSE    Crossmatch Result      Compatible Performed at [facility], [address], [place], [place] [address]   Comprehensive metabolic panel     Status: Abnormal   Collection Time: 10/15/23 10:12 PM  Result Value Ref Range   Sodium 137 135 - 145 mmol/L   Potassium 4.8 3.5 - 5.1 mmol/L   Chloride 105 98 - 111 mmol/L   CO2 21 (L) 22 - 32 mmol/L   Glucose, Bld 140 (H) 70 - 99 mg/dL   BUN 33 (H) 8 - 23 mg/dL   Creatinine, Ser 4.69 (H) 0.61 - 1.24 mg/dL   Calcium 8.9 8.9 - 62.9 mg/dL   Total Protein 6.1 (L) 6.5 - 8.1 g/dL   Albumin 3.1 (L) 3.5 - 5.0 g/dL   AST 37 15 - 41 U/L   ALT 24 0 - 44 U/L   Alkaline Phosphatase 74 38 - 126 U/L   Total Bilirubin 2.4 (H) 0.0 - 1.2 mg/dL   GFR, Estimated 38 (L) >60 mL/min   Anion gap 11 5 - 15  CBC with Differential     Status: Abnormal   Collection Time: 10/15/23 10:12 PM  Result Value Ref Range   WBC 10.1 4.0 - 10.5 K/uL   RBC 3.15 (L) 4.22 - 5.81 MIL/uL   Hemoglobin 11.6 (L) 13.0 - 17.0 g/dL   HCT 52.8 (L) 41.3 - 24.4 %   MCV 108.3 (H) 80.0 - 100.0 fL   MCH 36.8 (H) 26.0 - 34.0 pg   MCHC 34.0 30.0 - 36.0 g/dL   RDW 01.0 27.2 - 53.6 %   Platelets 118 (L) 150 - 400 K/uL   nRBC 0.0 0.0 - 0.2 %   Neutrophils  Relative % 62 %   Neutro Abs 6.2 1.7 - 7.7 K/uL   Lymphocytes Relative 13 %   Lymphs Abs 1.4 0.7 - 4.0 K/uL   Monocytes Relative 10 %   Monocytes Absolute 1.0 0.1 - 1.0 K/uL   Eosinophils Relative 10 %   Eosinophils Absolute 1.1 (H) 0.0 - 0.5 K/uL   Basophils Relative 1 %   Basophils Absolute 0.1 0.0 - 0.1 K/uL   Immature Granulocytes 4 %   Abs Immature Granulocytes 0.42 (H) 0.00 - 0.07 K/uL  Lipase, blood     Status: None   Collection Time: 10/15/23 10:12 PM  Result Value Ref Range   Lipase 29 11 -  51 U/L  Prepare RBC (crossmatch)     Status: None   Collection Time: 10/15/23 11:05 PM  Result Value Ref Range   Order Confirmation      ORDER PROCESSED BY BLOOD BANK Performed at [facility], [address], [place], [place] [address]   CBC with Differential/Platelet     Status: Abnormal   Collection Time: 10/16/23  6:29 AM  Result Value Ref Range   WBC 7.3 4.0 - 10.5 K/uL   RBC 3.06 (L) 4.22 - 5.81 MIL/uL   Hemoglobin 11.1 (L) 13.0 - 17.0 g/dL   HCT 30.1 (L) 60.1 - 09.3 %   MCV 104.6 (H) 80.0 - 100.0 fL   MCH 36.3 (H) 26.0 - 34.0 pg   MCHC 34.7 30.0 - 36.0 g/dL   RDW 23.5 (H) 57.3 - 22.0 %   Platelets 93 (L) 150 - 400 K/uL   nRBC 0.0 0.0 - 0.2 %   Neutrophils Relative % 64 %   Neutro Abs 4.7 1.7 - 7.7 K/uL   Lymphocytes Relative 13 %   Lymphs Abs 1.0 0.7 - 4.0 K/uL   Monocytes Relative 14 %   Monocytes Absolute 1.0 0.1 - 1.0 K/uL   Eosinophils Relative 3 %   Eosinophils Absolute 0.2 0.0 - 0.5 K/uL   Basophils Relative 1 %   Basophils Absolute 0.1 0.0 - 0.1 K/uL   Immature Granulocytes 5 %   Abs Immature Granulocytes 0.33 (H) 0.00 - 0.07 K/uL  Comprehensive metabolic panel with GFR     Status: Abnormal   Collection Time: 10/16/23  6:29 AM  Result Value Ref Range   Sodium 141 135 - 145 mmol/L   Potassium 5.0 3.5 - 5.1 mmol/L   Chloride 108 98 - 111 mmol/L   CO2 21 (L) 22 - 32 mmol/L   Glucose, Bld 122 (H) 70 - 99 mg/dL   BUN 52 (H) 8 - 23 mg/dL   Creatinine, Ser 2.54 (H) 0.61 - 1.24 mg/dL   Calcium 9.3 8.9 - 27.0 mg/dL   Total Protein 6.1 (L) 6.5 - 8.1 g/dL   Albumin 3.1 (L) 3.5 - 5.0 g/dL   AST 41 15 - 41 U/L   ALT 28 0 - 44 U/L   Alkaline Phosphatase 62 38 - 126 U/L   Total Bilirubin 2.8 (H) 0.0 - 1.2 mg/dL   GFR, Estimated 33 (L) >60 mL/min   Anion gap 12 5 - 15  Magnesium     Status: None   Collection Time: 10/16/23  6:29 AM  Result Value Ref Range   Magnesium 2.3 1.7 - 2.4 mg/dL  Ferritin     Status: None   Collection Time: 10/16/23  6:29 AM   Result Value Ref Range   Ferritin 53 24 - 336 ng/mL  Iron  and TIBC     Status: Abnormal   Collection Time: 10/16/23  6:29 AM  Result Value Ref Range   Iron 283 (H) 45 - 182 ug/dL   TIBC 161 096 - 045 ug/dL   Saturation Ratios 84 (H) 17.9 - 39.5 %   UIBC 56 ug/dL  Protime-INR     Status: Abnormal   Collection Time: 10/16/23  6:29 AM  Result Value Ref Range   Prothrombin Time 16.4 (H) 11.4 - 15.2 seconds   INR 1.3 (H) 0.8 - 1.2  APTT     Status: None   Collection Time: 10/16/23  6:29 AM  Result Value Ref Range   aPTT 26 24 - 36 seconds  Hemoglobin and hematocrit, blood     Status: Abnormal   Collection Time: 10/16/23  9:00 AM  Result Value Ref Range   Hemoglobin 11.1 (L) 13.0 - 17.0 g/dL   HCT 40.9 (L) 81.1 - 91.4 %   Basic Metabolic Panel: Recent Labs  Lab 10/15/23 2212 10/16/23 0629  NA 137 141  K 4.8 5.0  CL 105 108  CO2 21* 21*  GLUCOSE 140* 122*  BUN 33* 52*  CREATININE 1.73* 1.94*  CALCIUM 8.9 9.3  MG  --  2.3   Liver Function Tests: Recent Labs  Lab 10/15/23 2212 10/16/23 0629  AST 37 41  ALT 24 28  ALKPHOS 74 62  BILITOT 2.4* 2.8*  PROT 6.1* 6.1*  ALBUMIN 3.1* 3.1*   Recent Labs  Lab 10/15/23 2212  LIPASE 29   No results for input(s): "AMMONIA" in the last 168 hours. CBC: Recent Labs  Lab 10/15/23 2212 10/16/23 0629 10/16/23 0900  WBC 10.1 7.3  --   NEUTROABS 6.2 4.7  --   HGB 11.6* 11.1* 11.1*  HCT 34.1* 32.0* 32.0*  MCV 108.3* 104.6*  --   PLT 118* 93*  --    Cardiac Enzymes: No results for input(s): "CKTOTAL", "CKMB", "CKMBINDEX", "TROPONINIHS" in the last 168 hours.  BNP (last 3 results) No results for input(s): "PROBNP" in the last 8760 hours. CBG: No results for input(s): "GLUCAP" in the last 168 hours.  Radiological Exams on Admission:  CT ABDOMEN PELVIS W CONTRAST Result Date: 10/15/2023 CLINICAL DATA:  Acute abdominal pain EXAM: CT ABDOMEN AND PELVIS WITH CONTRAST TECHNIQUE: Multidetector CT imaging of the abdomen and  pelvis was performed using the standard protocol following bolus administration of intravenous contrast. RADIATION DOSE REDUCTION: This exam was performed according to the departmental dose-optimization program which includes automated exposure control, adjustment of the mA and/or kV according to patient size and/or use of iterative reconstruction technique. CONTRAST:  75mL OMNIPAQUE IOHEXOL 350 MG/ML SOLN COMPARISON:  08/10/2022 FINDINGS: Lower chest: No acute abnormality. Hepatobiliary: Gallbladder has been surgically removed. Left hepatic cyst is noted. Mild nodularity of the liver is seen. Pancreas: Unremarkable. No pancreatic ductal dilatation or surrounding inflammatory changes. Spleen: Normal in size without focal abnormality. Adrenals/Urinary Tract: Adrenal glands are within normal limits. Kidneys show the left kidney have a normal enhancement pattern. No calculi are seen. Right kidney is atrophic with a stable 8 mm nonobstructing stone. The ureters are within normal limits. The bladder is partially distended. Stomach/Bowel: Scattered diverticular change of the colon is noted without evidence of diverticulitis. The appendix is within normal limits. Small bowel and stomach are unremarkable. Vascular/Lymphatic: Aortic atherosclerosis. No enlarged abdominal or pelvic lymph nodes. Reproductive: Prostate has been surgically removed. Other: No abdominal wall hernia or abnormality. No abdominopelvic ascites. Musculoskeletal: No acute or  significant osseous findings. Chronic L1 compression deformity is noted. IMPRESSION: Diverticulosis without diverticulitis. Atrophic right kidney. No acute abnormality noted. Electronically Signed   By: Alcide Clever M.D.   On: 10/15/2023 23:56   CT Head Wo Contrast Result Date: 10/15/2023 CLINICAL DATA:  TIA EXAM: CT HEAD WITHOUT CONTRAST TECHNIQUE: Contiguous axial images were obtained from the base of the skull through the vertex without intravenous contrast. RADIATION DOSE  REDUCTION: This exam was performed according to the departmental dose-optimization program which includes automated exposure control, adjustment of the mA and/or kV according to patient size and/or use of iterative reconstruction technique. COMPARISON:  None Available. FINDINGS: Brain: No evidence of acute infarction, hemorrhage, hydrocephalus, extra-axial collection or mass lesion/mass effect. There is moderate diffuse atrophy. There is mild periventricular white matter hypodensity, likely chronic small vessel ischemic change. Vascular: Atherosclerotic calcifications are present within the cavernous internal carotid arteries. Skull: Normal. Negative for fracture or focal lesion. Sinuses/Orbits: No acute finding. Other: None. IMPRESSION: 1. No acute intracranial process. Moderate diffuse atrophy. Mild chronic small vessel ischemic change. Electronically Signed   By: Darliss Cheney M.D.   On: 10/15/2023 23:51    chest X-ray  I/O last 3 completed shifts: In: 324.5 [Blood:324.5] Out: -  No intake/output data recorded.       Assessment and Plan:  MAXAMILIAN AMADON is a 89 y.o. male with a history of ventricular standstill s/p PPM, CAD s/p stenting of RCA 2017, Prostate CA s/p radical prostatectomy 1998, HTN, HLD, h/o carotid artery disease s/p L CEA 1998, PAF (no longer on OAC), GI bleeding, chronic balance issues, and TIA    * Acute upper GI bleed See hpi. No episodes evident today. Presenting initially as presyncope and hypotension.  With blood pressure improved with running IV fluids.  However given patient's marked thirst and persistent diastolic blood pressure under 60 at this time I will give him a liter of LR bolus over the next couple of hours.  Continue with 75 cc/h of normal saline infusion.  Type and screen has been sent.  Continue with pantoprazole 40 mg IV twice daily.  GI has been engaged and patient has been added to the list.  Look forward to their evaluation.  They have put in n.p.o.  status ordered for the patient till around noon after which diet may be started clear liquid if patient without vomiting INR and PTT are not actionable.  Thrombocytopenia chronic and not at transfusion threshold given current clinical picture I will check an H&H at approximately 1 PM.  Thrombocytopenia (HCC) This is chronic and stable, continue outpatient workup  Lumbar stress fracture Wife, patient was diagnosed with lumbar spinal fracture back in January, this is corroborated with medical record review.  Patient has been using NSAIDs as needed since then.  Will stop the NSAIDs.  I will order standing acetaminophen for patient for 3 days and oxycodone 2.5 mg as needed for moderate pain.   Restart aspirin per GI afer endoscopy. Hold same for now. For TIA chronic. C.w farxiga for ??CHF per wife . I have added insulin sliding scale  DVT ppx with SCD     Advance Care Planning:   Code Status: Full Code   Consults: GI  Family Communication: wife at bedsdie careplan discussed.  Severity of Illness: The appropriate patient status for this patient is INPATIENT. Inpatient status is judged to be reasonable and necessary in order to provide the required intensity of service to ensure the patient's safety. The patient's presenting  symptoms, physical exam findings, and initial radiographic and laboratory data in the context of their chronic comorbidities is felt to place them at high risk for further clinical deterioration. Furthermore, it is not anticipated that the patient will be medically stable for discharge from the hospital within 2 midnights of admission.   * I certify that at the point of admission it is my clinical judgment that the patient will require inpatient hospital care spanning beyond 2 midnights from the point of admission due to high intensity of service, high risk for further deterioration and high frequency of surveillance required.*  Author: Nolberto Hanlon, MD 10/16/2023 10:33  AM  For on call review [email or web address].

## 2023-10-16 NOTE — Assessment & Plan Note (Signed)
 This is chronic and stable, continue outpatient workup

## 2023-10-16 NOTE — Assessment & Plan Note (Signed)
 Wife, patient was diagnosed with lumbar spinal fracture back in January, this is corroborated with medical record review.  Patient has been using NSAIDs as needed since then.  Will stop the NSAIDs.  I will order standing acetaminophen for patient for 3 days and oxycodone 2.5 mg as needed for moderate pain.

## 2023-10-16 NOTE — ED Notes (Signed)
Patient transported to endo with RN

## 2023-10-16 NOTE — ED Notes (Signed)
 The  pt was found off the stretcher he had voided in the trash can  he is asking for water and something to drink  cleaned up placed back in the stretcher  he is very hard of hearing

## 2023-10-16 NOTE — Transfer of Care (Signed)
 Immediate Anesthesia Transfer of Care Note  Patient: [name]  Procedure(s) Performed: EGD (ESOPHAGOGASTRODUODENOSCOPY) (Left) ESOPHAGOSCOPY, WITH ESOPHAGEAL VARICES BAND LIGATION  Patient Location: PACU  Anesthesia Type:MAC  Level of Consciousness: drowsy  Airway & Oxygen Therapy: Patient Spontanous Breathing and Patient connected to nasal cannula oxygen  Post-op Assessment: Report given to RN and Post -op Vital signs reviewed and stable  Post vital signs: Reviewed and stable  Last Vitals:  Vitals Value Taken Time  BP 130/59 10/16/23 1330  Temp    Pulse 92 10/16/23 1332  Resp 31 10/16/23 1332  SpO2 93 % 10/16/23 1332  Vitals shown include unfiled device data.  Last Pain:  Vitals:   10/16/23 1218  TempSrc: Temporal  PainSc: 0-No pain         Complications: No notable events documented.

## 2023-10-16 NOTE — ED Notes (Signed)
 Pt was trying to ambulate by himself, I placed a posey bed alarm once pt was assisted back in bed.

## 2023-10-17 DIAGNOSIS — K922 Gastrointestinal hemorrhage, unspecified: Secondary | ICD-10-CM | POA: Diagnosis not present

## 2023-10-17 LAB — GLUCOSE, CAPILLARY
Glucose-Capillary: 123 mg/dL — ABNORMAL HIGH (ref 70–99)
Glucose-Capillary: 145 mg/dL — ABNORMAL HIGH (ref 70–99)
Glucose-Capillary: 121 mg/dL — ABNORMAL HIGH (ref 70–99)
Glucose-Capillary: 103 mg/dL — ABNORMAL HIGH (ref 70–99)

## 2023-10-17 LAB — BASIC METABOLIC PANEL WITH GFR
Anion gap: 8 (ref 5–15)
BUN: 54 mg/dL — ABNORMAL HIGH (ref 8–23)
CO2: 19 mmol/L — ABNORMAL LOW (ref 22–32)
Calcium: 7.5 mg/dL — ABNORMAL LOW (ref 8.9–10.3)
Chloride: 116 mmol/L — ABNORMAL HIGH (ref 98–111)
Creatinine, Ser: 1.72 mg/dL — ABNORMAL HIGH (ref 0.61–1.24)
GFR, Estimated: 38 mL/min — ABNORMAL LOW (ref >60)
Glucose, Bld: 130 mg/dL — ABNORMAL HIGH (ref 70–99)
Potassium: 3.7 mmol/L (ref 3.5–5.1)
Sodium: 143 mmol/L (ref 135–145)

## 2023-10-17 LAB — TYPE AND SCREEN
ABO/RH(D): O POS
Antibody Screen: NEGATIVE
Unit division: 0

## 2023-10-17 LAB — BPAM RBC
Blood Product Expiration Date: 202504282359
ISSUE DATE / TIME: 202504050404
Unit Type and Rh: 5100

## 2023-10-17 LAB — CBC
HCT: 28.9 % — ABNORMAL LOW (ref 39.0–52.0)
Hemoglobin: 9.7 g/dL — ABNORMAL LOW (ref 13.0–17.0)
MCH: 36.1 pg — ABNORMAL HIGH (ref 26.0–34.0)
MCHC: 33.6 g/dL (ref 30.0–36.0)
MCV: 107.4 fL — ABNORMAL HIGH (ref 80.0–100.0)
Platelets: 89 10*3/uL — ABNORMAL LOW (ref 150–400)
RBC: 2.69 MIL/uL — ABNORMAL LOW (ref 4.22–5.81)
RDW: 16.8 % — ABNORMAL HIGH (ref 11.5–15.5)
WBC: 7.8 10*3/uL (ref 4.0–10.5)
nRBC: 0 % (ref 0.0–0.2)

## 2023-10-17 NOTE — Progress Notes (Signed)
  Progress Note   Patient: [name] QVZ:[id number] DOB: April 13, 1934 DOA: 10/15/2023     1 DOS: the patient was seen and examined on 10/17/2023 at 12:42PM      Brief hospital course: 89 y.o. M with moderate dementia, lives at home, pAF not on [facility] due to hx GIB, CAD s/p PCI remote, HLD, TIA and PVD s/p CEA, HTN, obesity, and prosCA who presented with vomiting blood.  Evidently was in his usual state of health until the evening of admission when he became acutely weak and pale, then vomited some blood.  In the ER, blood pressure 80/30, and vomited a large amount of blood.  Started on IV PPI, IV fluids, GI consulted.  EGD on 4/5 showed several varices with red wale sign.  4 bands applied.      Assessment and Plan: * Variceal bleeding Acute blood loss anemia  Admitted and GI consulted.  EGD showed varices with stigmata of bleeding.  Bands applied.    Today with some melena/clearing blood, no further hematemesis.  Hemodynamically stable. - Continue octreotide gtt for 24-48 hours more, per GI - Continue IV PPI - Consult GI, appreciate cares - Given varices and thrombocytopenia, will need cirrhosis work up by GI outpatient   Thrombocytopenia (HCC) Chronic, stable  Dementia Moderate, lives at home.  At baseline, oriented to place and self, dresses self.  Probably ~FAST 3-4  Compression fracture - Avoid NSAIDs  Coronary artery disease Cerebrovascular disease Hyperlipidemia Hypertension BP normal, not on antihypertensives at baseline anymore - Hold aspirin - Continue home Farxiga  Paroxysmal atrial fibrillation  Not on [facility] due to prior GIB. In sinus here.  Chronic kidney disease stage IIIb Baseline appears to be around 1.6  Hypothyroidism - Continue thyroid Armour      Subjective: Patient is very tired and weak.  He has no focal weakness, no aphasia.  He is passing melena, but no further vomiting blood.     Physical Exam: BP (!) 140/61 (BP Location: Right Arm)    Pulse 76   Temp 98.5 F (36.9 C) (Oral)   Resp 18   Ht 5\' 6"  (1.676 m)   Wt 88 kg   SpO2 96%   BMI 31.31 kg/m   Elderly adult male, lying in bed, appears weak and tired RRR, no murmurs, no peripheral edema Respiratory normal, lungs clear without rales or wheezes Abdomen soft with mild nonfocal tenderness throughout, no guarding He opens his eyes in response to questions, but his attention is diminished and he is sleepy and somnolent.  He has generalized weakness but symmetric strength in the upper extremities, face symmetric, speech fluent, oriented to self, hospital, wife, son.    Data Reviewed: Basic metabolic panel notable for creatinine stable at 1.7, magnesium normal CBC notable for platelets 89K, hemoglobin trending slightly down to 9.7 EGD report reviewed    Family Communication: Wife and son at the bedside    Disposition: Status is: Inpatient 89 year old man presented with variceal bleeding  This is stabilized, GI recommend octreotide for another 24 to 48 hours, then likely to SNF later this week.        Author: Alberteen Sam, MD 10/17/2023 6:39 PM  For on call review [email or web address].

## 2023-10-17 NOTE — Plan of Care (Signed)
  Problem: Education: Goal: Ability to describe self-care measures that may prevent or decrease complications (Diabetes Survival Skills Education) will improve Outcome: Progressing   Problem: Coping: Goal: Ability to adjust to condition or change in health will improve Outcome: Progressing   Problem: Activity: Goal: Risk for activity intolerance will decrease Outcome: Progressing   Problem: Coping: Goal: Level of anxiety will decrease Outcome: Progressing

## 2023-10-17 NOTE — Hospital Course (Signed)
 89 y.o. male with medical history significant of dementia as per wife at the bedside.  History is principally obtained from patient's wife at the bedside patient is also chronically hard of hearing.  Other medical conditions are as listed below.  Patient does have prior known atrial fibrillation however is not on anticoagulation.  Patient seems to have been in his usual state of health till late last evening when he seems to have called out his wife for help.  When patient was seen by wife he was noted to be somewhat fatigued/pale leaning to 1 side and more confused than usual.  Patient was subsequently noted to cough up/vomit out a little bit of blood by wife.  However patient regained his strength in the next several minutes.  Patient did not lose consciousness.  EMS was called.  EMS record indicates initial blood pressure of 80/30 mmHg.  Subsequently improving 221/48.    and patient was brought to the ER.     Initial blood pressure in the ER seems to have been 74/45.  Patient vomited at least 1 copious amount of bloody emesis after arrival to the ER.  No report of diarrhea or further episodes of vomiting.  Patient has been treated with the running IV fluids since then.  Patient reports feeling thirsty.  Again no history of diarrhea abdominal pain fever or rigors.

## 2023-10-17 NOTE — Evaluation (Signed)
 Physical Therapy Evaluation Patient Details Name: [name] MRN: [id number] DOB: 10-02-1933 Today's Date: 10/17/2023  History of Present Illness  89 y.o. male presents to [facility] 10/15/23 with AMS and hematemesis. Pt with acute upper GI bleed s/p EGD with band ligation on 4/5. PMH: TIA, HTN, HLD, caroitd artery occlusion, CAD, prostate CA, tinnitus, PAF, diverticulitis, temp pacemaker 1/24, lumbar spinal fx in 1/25   Clinical Impression  Pt in bed upon arrival and agreeable to PT eval. Pt had difficulty answering questions in today's session with history taken from chart review. PTA, pt was ModI with a SP cane or RW. In today's session, pt required CGA for bed mobility and to stand with a RW. Pt was then able to ambulate ~47ft with MinA to assist with RW management. Pt also needed multiple cues for safety, directions, and to continue walking. Pt currently with functional limitations due to the deficits listed below (see PT Problem List). Pt would benefit from acute skilled PT to address functional impairments. Recommending post-acute HHPT to work towards independence with mobility. Recommend TOC follow to ensure family is comfortable providing 24/7 physical assist at home. Acute PT to follow.         If plan is discharge home, recommend the following: A lot of help with walking and/or transfers;A lot of help with bathing/dressing/bathroom;Assistance with cooking/housework;Assist for transportation;Help with stairs or ramp for entrance   Can travel by private vehicle    Yes    Equipment Recommendations None recommended by PT  Recommendations for Other Services  OT consult    Functional Status Assessment Patient has had a recent decline in their functional status and demonstrates the ability to make significant improvements in function in a reasonable and predictable amount of time.     Precautions / Restrictions Precautions Precautions: Fall;Back (back for comfort) Recall of  Precautions/Restrictions: Impaired Precaution/Restrictions Comments: chronic lumbar stress fx (1/25) Restrictions Weight Bearing Restrictions Per Provider Order: No      Mobility  Bed Mobility Overal bed mobility: Needs Assistance Bed Mobility: Supine to Sit, Sit to Supine    Supine to sit: Contact guard, HOB elevated Sit to supine: Min assist   General bed mobility comments: CGA with increased time and effort to move from supine/sit. MinA to return to supine for slight LE management    Transfers Overall transfer level: Needs assistance Equipment used: Rolling walker (2 wheels) Transfers: Sit to/from Stand Sit to Stand: Contact guard assist      General transfer comment: CGA for safety with slight unsteadiness upon rising. Cues for hand placement    Ambulation/Gait Ambulation/Gait assistance: Min assist Gait Distance (Feet): 50 Feet Assistive device: Rolling walker (2 wheels) Gait Pattern/deviations: Step-through pattern, Shuffle, Trunk flexed Gait velocity: decr     General Gait Details: shuffling steps with pt occasionally stopping. Cues for directions and to continue walking. MinA for RW management with turns    Balance Overall balance assessment: Needs assistance, Mild deficits observed, not formally tested Sitting-balance support: No upper extremity supported, Feet supported Sitting balance-Leahy Scale: Fair     Standing balance support: Bilateral upper extremity supported, During functional activity, Reliant on assistive device for balance Standing balance-Leahy Scale: Poor Standing balance comment: reliant on RW       Pertinent Vitals/Pain Pain Assessment Pain Assessment: No/denies pain    Home Living Family/patient expects to be discharged to:: Private residence Living Arrangements: Spouse/significant other Available Help at Discharge: Family;Available 24 hours/day Type of Home: House Home Access: Stairs to enter  Entrance Stairs-Rails:  None Entrance Stairs-Number of Steps: 1 Alternate Level Stairs-Number of Steps: 7 Home Layout: Multi-level;Bed/bath upstairs Home Equipment: Agricultural consultant (2 wheels);Cane - single point;Rollator (4 wheels);BSC/3in1 Additional Comments: Info from chart review    Prior Function Prior Level of Function : Needs assist      Mobility Comments: pt ambulates with use of SPC, refuses to utilize walker. At times pt and family utilize rollator as a wheelchair ADLs Comments: assist for IADLs, intermittent assist for ADLs     Extremity/Trunk Assessment   Upper Extremity Assessment Upper Extremity Assessment: Defer to OT evaluation    Lower Extremity Assessment Lower Extremity Assessment: Generalized weakness       Communication   Communication Communication: Impaired Factors Affecting Communication: Hearing impaired    Cognition Arousal: Alert Behavior During Therapy: WFL for tasks assessed/performed   PT - Cognitive impairments: No family/caregiver present to determine baseline, Orientation, Problem solving, Safety/Judgement, Sequencing, Memory   Orientation impairments: Time (alert to year but not month)    PT - Cognition Comments: pt unable to recall any home set-up history. Had difficulty answering questions or understanding commands Following commands: Impaired Following commands impaired: Follows one step commands inconsistently, Follows one step commands with increased time     Cueing Cueing Techniques: Verbal cues, Tactile cues      PT Assessment Patient needs continued PT services  PT Problem List Decreased strength;Decreased activity tolerance;Decreased balance;Decreased mobility;Decreased safety awareness;Decreased cognition;Decreased knowledge of use of DME       PT Treatment Interventions DME instruction;Gait training;Stair training;Functional mobility training;Therapeutic activities;Therapeutic exercise;Patient/family education;Neuromuscular re-education;Balance  training    PT Goals (Current goals can be found in the Care Plan section)  Acute Rehab PT Goals Patient Stated Goal: to get better PT Goal Formulation: With patient Time For Goal Achievement: 10/31/23 Potential to Achieve Goals: Good    Frequency Min 2X/week        AM-PAC PT "6 Clicks" Mobility  Outcome Measure Help needed turning from your back to your side while in a flat bed without using bedrails?: A Little Help needed moving from lying on your back to sitting on the side of a flat bed without using bedrails?: A Little Help needed moving to and from a bed to a chair (including a wheelchair)?: A Little Help needed standing up from a chair using your arms (e.g., wheelchair or bedside chair)?: A Little Help needed to walk in hospital room?: A Little Help needed climbing 3-5 steps with a railing? : A Lot 6 Click Score: 17    End of Session Equipment Utilized During Treatment: Gait belt Activity Tolerance: Patient tolerated treatment well Patient left: in bed;with call bell/phone within reach;with bed alarm set Nurse Communication: Mobility status PT Visit Diagnosis: Unsteadiness on feet (R26.81);Other abnormalities of gait and mobility (R26.89);Muscle weakness (generalized) (M62.81)    Time: 1610-9604 PT Time Calculation (min) (ACUTE ONLY): 18 min   Charges:   PT Evaluation $PT Eval Low Complexity: 1 Low   PT General Charges $$ ACUTE PT VISIT: 1 Visit    Hilton Cork, PT, DPT Secure Chat Preferred  Rehab Office [phone]   Arturo Morton Brion Aliment 10/17/2023, 5:42 PM

## 2023-10-17 NOTE — Progress Notes (Signed)
 Subjective: No further reported hematemesis.  Objective: Vital signs in last 24 hours: Temp:  [97.7 F (36.5 C)-99.2 F (37.3 C)] 97.7 F (36.5 C) (04/06 0716) Pulse Rate:  [40-104] 40 (04/06 0716) Resp:  [8-31] 19 (04/06 0716) BP: (112-141)/(46-76) 131/49 (04/06 0716) SpO2:  [90 %-99 %] 95 % (04/06 0716) Weight:  [88 kg] 88 kg (04/05 1218) Weight change:     PE: GEN:  Awake but somnolent; arousable NEURO:  No encephalopathy ABD:  Soft, non-tender  Lab Results:  CBC    Component Value Date/Time   WBC 7.3 10/16/2023 0629   RBC 3.06 (L) 10/16/2023 0629   HGB 10.5 (L) 10/16/2023 1453   HGB 14.2 11/30/2016 1142   HCT 30.5 (L) 10/16/2023 1453   HCT 39.6 11/30/2016 1142   PLT 93 (L) 10/16/2023 0629   PLT 120 (L) 11/30/2016 1142   MCV 104.6 (H) 10/16/2023 0629   MCV 99 (H) 11/30/2016 1142   MCH 36.3 (H) 10/16/2023 0629   MCHC 34.7 10/16/2023 0629   RDW 15.7 (H) 10/16/2023 0629   RDW 13.1 11/30/2016 1142   LYMPHSABS 1.0 10/16/2023 0629   LYMPHSABS 1.3 11/30/2016 1142   MONOABS 1.0 10/16/2023 0629   EOSABS 0.2 10/16/2023 0629   EOSABS 0.7 (H) 11/30/2016 1142   BASOSABS 0.1 10/16/2023 0629   BASOSABS 0.2 11/30/2016 1142  CMP     Component Value Date/Time   NA 141 10/16/2023 0629   NA 140 04/18/2021 0000   NA 140 11/30/2016 1142   K 5.0 10/16/2023 0629   K 4.5 11/30/2016 1142   CL 108 10/16/2023 0629   CO2 21 (L) 10/16/2023 0629   CO2 26 11/30/2016 1142   GLUCOSE 122 (H) 10/16/2023 0629   GLUCOSE 100 11/30/2016 1142   BUN 52 (H) 10/16/2023 0629   BUN 17 04/18/2021 0000   BUN 20.4 11/30/2016 1142   CREATININE 1.94 (H) 10/16/2023 0629   CREATININE 1.6 (H) 11/30/2016 1142   CALCIUM 9.3 10/16/2023 0629   CALCIUM 9.4 11/30/2016 1142   PROT 6.1 (L) 10/16/2023 0629   PROT 7.3 07/09/2017 0938   PROT 6.9 11/30/2016 1142   ALBUMIN 3.1 (L) 10/16/2023 0629   ALBUMIN 4.5 07/09/2017 0938   ALBUMIN 4.0 11/30/2016 1142   AST 41 10/16/2023 0629   AST 24 11/30/2016 1142    ALT 28 10/16/2023 0629   ALT 26 11/30/2016 1142   ALKPHOS 62 10/16/2023 0629   ALKPHOS 45 11/30/2016 1142   BILITOT 2.8 (H) 10/16/2023 0629   BILITOT 1.4 (H) 07/09/2017 0938   BILITOT 2.09 (H) 11/30/2016 1142   GFR 29.01 (L) 10/01/2022 1457   EGFR 39 (L) 11/30/2016 1142   GFRNONAA 33 (L) 10/16/2023 0629    Assessment:   Hematemesis and melena, seemingly resolved. Generalized abdominal pain, improving. Acute blood loss anemia. Esophageal varices s/p banding.  Suspect underlying cirrhosis though surprisingly no portal hypertensive changes reported on recent CT scan.  Plan:   PPI IV and Octreotide gtt for next 24-48 hours. CBC and transfuse as needed. Slowly advance diet. Hopefully able to discharge home in next day or two; will need outpatient follow-up for further investigation into possible cirrhosis as well as consideration of repeat endoscopy in next few weeks (but family not sure they want to do this given his age and comorbidities, in absence of emergency situation). [facility] will follow.   [name] 10/17/2023, 9:37 AM   Cell [phone] If no answer or after 5 PM call [phone]

## 2023-10-18 ENCOUNTER — Encounter (HOSPITAL_COMMUNITY): Payer: Self-pay | Admitting: Gastroenterology

## 2023-10-18 DIAGNOSIS — F0390 Unspecified dementia without behavioral disturbance: Secondary | ICD-10-CM | POA: Insufficient documentation

## 2023-10-18 DIAGNOSIS — I8511 Secondary esophageal varices with bleeding: Secondary | ICD-10-CM | POA: Diagnosis not present

## 2023-10-18 DIAGNOSIS — I350 Nonrheumatic aortic (valve) stenosis: Secondary | ICD-10-CM

## 2023-10-18 LAB — BASIC METABOLIC PANEL WITH GFR
Anion gap: 11 (ref 5–15)
BUN: 50 mg/dL — ABNORMAL HIGH (ref 8–23)
CO2: 23 mmol/L (ref 22–32)
Calcium: 8.6 mg/dL — ABNORMAL LOW (ref 8.9–10.3)
Chloride: 108 mmol/L (ref 98–111)
Creatinine, Ser: 1.95 mg/dL — ABNORMAL HIGH (ref 0.61–1.24)
GFR, Estimated: 32 mL/min — ABNORMAL LOW (ref >60)
Glucose, Bld: 117 mg/dL — ABNORMAL HIGH (ref 70–99)
Potassium: 4 mmol/L (ref 3.5–5.1)
Sodium: 142 mmol/L (ref 135–145)

## 2023-10-18 LAB — CBC
HCT: 28 % — ABNORMAL LOW (ref 39.0–52.0)
Hemoglobin: 9.6 g/dL — ABNORMAL LOW (ref 13.0–17.0)
MCH: 36.1 pg — ABNORMAL HIGH (ref 26.0–34.0)
MCHC: 34.3 g/dL (ref 30.0–36.0)
MCV: 105.3 fL — ABNORMAL HIGH (ref 80.0–100.0)
Platelets: 77 10*3/uL — ABNORMAL LOW (ref 150–400)
RBC: 2.66 MIL/uL — ABNORMAL LOW (ref 4.22–5.81)
RDW: 16.2 % — ABNORMAL HIGH (ref 11.5–15.5)
WBC: 5.4 10*3/uL (ref 4.0–10.5)
nRBC: 0 % (ref 0.0–0.2)

## 2023-10-18 LAB — GLUCOSE, CAPILLARY
Glucose-Capillary: 138 mg/dL — ABNORMAL HIGH (ref 70–99)
Glucose-Capillary: 107 mg/dL — ABNORMAL HIGH (ref 70–99)
Glucose-Capillary: 115 mg/dL — ABNORMAL HIGH (ref 70–99)

## 2023-10-18 MED ORDER — SODIUM CHLORIDE 0.9 % IV SOLN
1.0000 g | INTRAVENOUS | Status: DC
  Administered 2023-10-18: 1 g via INTRAVENOUS
  Filled 2023-10-18: qty 10

## 2023-10-18 MED ORDER — SODIUM CHLORIDE 0.9 % IV SOLN: INTRAVENOUS | Status: DC

## 2023-10-18 MED ORDER — QUETIAPINE FUMARATE 25 MG PO TABS
25.0000 mg | ORAL_TABLET | Freq: Every day | ORAL | Status: DC
  Administered 2023-10-18: 25 mg via ORAL
  Filled 2023-10-18: qty 1

## 2023-10-18 NOTE — Progress Notes (Signed)
 Progress Note   Patient: [name] ION:[id number] DOB: 01-May-1934 DOA: 10/15/2023     2 DOS: the patient was seen and examined on 10/18/2023 at 9:27AM      Brief hospital course: 89 y.o. M with moderate dementia, lives at home, pAF not on [facility] due to hx GIB, CAD s/p PCI remote, HLD, TIA and PVD s/p CEA, HTN, obesity, and prosCA who presented with vomiting blood.   Evidently was in his usual state of health until the evening of admission when he became acutely weak and pale, then vomited some blood.   In the ER, blood pressure 80/30, and vomited a large amount of blood.  Started on IV PPI, IV fluids, GI consulted.   EGD on 4/5 showed several varices with red wale sign.  3 bands applied.      Assessment and Plan: * Esophageal varices with bleeding (HCC) Acute blood loss anemia  Admitted and GI consulted.  EGD showed varices with stigmata of bleeding.  Bands applied.     Today with some melena/clearing blood, no further hematemesis.  Hemodynamically stable. - Continue octreotide gtt for 72 hours, per GI (ending tomorrow) - Continue IV PPI - IV Rocephin - Consult GI, appreciate cares - Given varices and thrombocytopenia, will need cirrhosis work up by GI outpatient  Thrombocytopenia (HCC) Chronic, stable  AF (paroxysmal atrial fibrillation) (HCC) Not on AC due to prior GIB. In sinus here.   Chronic kidney disease, stage 3b (HCC) Cr stable around apparent baseline 1.6  Aortic stenosis Mitral calcification/stenosis Known mitral stenosis and Aortic stenosis.  Last echo 2 months ago.    Dementia without behavioral disturbance (HCC) Moderate, lives at home. At baseline, oriented to place and self, dresses self. Probably ~FAST 3-4   Lumbar stress fracture Compression fx diagnosed in Jan.  Has been using NSAIDs - Avoid NSAIDs   Acute metabolic encephalopathy Has been sundowning and required haldol x2, thankfully none last night. Due to age/dementia, bleeding.   - Avoid  benzos - Avoid haldol unless absolutely necessary - start melatonin and low dose seroquel - Stop seroquel at d/c  - Delirium precautions  Coronary artery disease involving native coronary artery of native heart Cerebrovascular disease Hyperlipidemia Hypertension BP normal, not on antihypertensives at baseline anymore - Hold aspirin - Continue home Farxiga  Hypothyroidism, acquired - Continue Armour           Subjective: Having a lot of restlessness, wants to get up, wander.  No fever, no vomiting, no melena. Stool brown.     Physical Exam: BP (!) 146/72 (BP Location: Right Arm)   Pulse 86   Temp 97.9 F (36.6 C) (Oral)   Resp 20   Ht 5\' 6"  (1.676 m)   Wt 83.1 kg   SpO2 96%   BMI 29.57 kg/m   Elderly adult male, lying in bed, appears weak and tired RRR, no murmurs, no peripheral edema Respiratory normal, lungs clear without rales or wheezes Abdomen soft with mild nonfocal tenderness throughout, no guarding Interactive makes eye contract, responds appropriately.  Hard of hearing.  Moves all extremities with weakness but symmetry  Data Reviewed: BMP shows Cr stable at 1.95, K normal Hgb stable at 9.6 Discussed with GI  Family Communication: Wife    Disposition: Status is: Inpatient 89 yo M with variceal bleeding  Will need 24 hours more octreotide and PPI, home tomorrow morning with HH        Author: Alberteen Sam, MD 10/18/2023 4:15 PM  For on call review [email or web address].

## 2023-10-18 NOTE — Assessment & Plan Note (Signed)
 Not on AC due to prior GIB. In sinus here.

## 2023-10-18 NOTE — Assessment & Plan Note (Addendum)
 Mentation decreased relative to baseline.  Due to age/dementia, bleeding.  Stable  Has been sundowning and required haldol x2, thankfully none last night. - Avoid benzos - Avoid haldol unless absolutely necessary - start melatonin and low dose seroquel - Stop seroquel at d/c  - Delirium precautions

## 2023-10-18 NOTE — Plan of Care (Signed)
  Problem: Coping: Goal: Ability to adjust to condition or change in health will improve Outcome: Progressing   Problem: Fluid Volume: Goal: Ability to maintain a balanced intake and output will improve Outcome: Progressing   Problem: Health Behavior/Discharge Planning: Goal: Ability to manage health-related needs will improve Outcome: Progressing

## 2023-10-18 NOTE — Assessment & Plan Note (Signed)
 Moderate, lives at home. At baseline, oriented to place and self, dresses self. Probably ~FAST 3-4

## 2023-10-18 NOTE — Evaluation (Signed)
 Occupational Therapy Evaluation Patient Details Name: [name] MRN: [id number] DOB: 03-17-1934 Today's Date: 10/18/2023   History of Present Illness   89 y.o. male presents to [facility] 10/15/23 with AMS and hematemesis. Pt with acute upper GI bleed s/p EGD with band ligation on 4/5. PMH: TIA, HTN, HLD, caroitd artery occlusion, CAD, prostate CA, tinnitus, PAF, diverticulitis, temp pacemaker 1/24, lumbar spinal fx in 1/25     Clinical Impressions Pt admitted with the above diagnosis and has the deficits outlined below. PT would benefit from cont OT to increase independence with basic adls and adl transfers back to his baseline level of supervision for dressing and occasional assist for other basic adls. Pt lives with wife but unsure at this time if wife can handle pt at this level at home. Pt required mod assist for some mobility today and overall mod assist for all adls. Pt will not be able to be left alone.  Unsure if 24 hour mod assist is available so less than 3 hours of therapy a day has been recommended. If wife can assist pt at home or private duty nursing could be hired, pt could d/c home with HHOT.     If plan is discharge home, recommend the following:   A lot of help with walking and/or transfers;A lot of help with bathing/dressing/bathroom;Assistance with cooking/housework;Direct supervision/assist for medications management;Direct supervision/assist for financial management;Help with stairs or ramp for entrance;Assist for transportation;Supervision due to cognitive status     Functional Status Assessment   Patient has had a recent decline in their functional status and demonstrates the ability to make significant improvements in function in a reasonable and predictable amount of time.     Equipment Recommendations   None recommended by OT     Recommendations for Other Services         Precautions/Restrictions   Precautions Precautions: Fall;Back Recall of  Precautions/Restrictions: Impaired Precaution/Restrictions Comments: chronic lumbar stress fx (1/25) Restrictions Weight Bearing Restrictions Per Provider Order: No     Mobility Bed Mobility Overal bed mobility: Needs Assistance Bed Mobility: Supine to Sit     Supine to sit: Min assist, HOB elevated, Used rails     General bed mobility comments: extra time needed to get to EOB. Pt unconfortable in scrotal area and bottom due to multiple bowel movements. Attempting to avoid sheering of this area therefore help required.    Transfers Overall transfer level: Needs assistance Equipment used: Rolling walker (2 wheels) Transfers: Sit to/from Stand Sit to Stand: Min assist           General transfer comment: min assist to get to full stand and cues for hand placement      Balance Overall balance assessment: Needs assistance Sitting-balance support: No upper extremity supported, Feet supported Sitting balance-Leahy Scale: Fair     Standing balance support: Bilateral upper extremity supported, During functional activity, Reliant on assistive device for balance Standing balance-Leahy Scale: Poor Standing balance comment: reliant on RW                           ADL either performed or assessed with clinical judgement   ADL Overall ADL's : Needs assistance/impaired Eating/Feeding: Set up;Sitting   Grooming: Wash/dry hands;Wash/dry face;Minimal assistance;Sitting Grooming Details (indicate cue type and reason): Pt sat at EOB to wash face and hands to wake up and prepare for toileting tasks. Upper Body Bathing: Minimal assistance;Sitting   Lower Body Bathing: Moderate assistance;Sit to/from  stand;Cueing for compensatory techniques   Upper Body Dressing : Minimal assistance;Sitting   Lower Body Dressing: Maximal assistance;Sit to/from stand;Cueing for compensatory techniques Lower Body Dressing Details (indicate cue type and reason): Pt fatigues very requickly.  Assist to donn socks at EOB. Pt attemted but unable to cross legs over to donn as he does at home. Due to fatigue and need to use bathroom socks were donned for pt. Toilet Transfer: Minimal assistance;Stand-pivot;BSC/3in1;Rolling walker (2 wheels) Toilet Transfer Details (indicate cue type and reason): extra time given for transfer. Pt did not required a lot of physical assist but extra time needed Toileting- Clothing Manipulation and Hygiene: Maximal assistance;Sit to/from stand Toileting - Clothing Manipulation Details (indicate cue type and reason): Pt stood to be cleaned and held to walker with min assist while second person cleaned pt.     Functional mobility during ADLs: Moderate assistance;Rolling walker (2 wheels);Cueing for safety;Cueing for sequencing General ADL Comments: Pt very limited with adls this am. Pt is confused but it seems this is baseline for pt although worse out of his environment.  Pt requires overall mod assist for most adls due to fatigue and decreased balance in standing.     Vision Baseline Vision/History: 1 Wears glasses Ability to See in Adequate Light: 0 Adequate Patient Visual Report: No change from baseline Vision Assessment?: No apparent visual deficits Additional Comments: difficult to assess but located all items asked of him in room and seems to scan full environment without difficulty.     Perception         Praxis Praxis: Not tested       Pertinent Vitals/Pain Pain Assessment Pain Assessment: Faces Faces Pain Scale: Hurts a little bit Pain Location: back Pain Descriptors / Indicators: Sore Pain Intervention(s): Limited activity within patient's tolerance, Repositioned     Extremity/Trunk Assessment Upper Extremity Assessment Upper Extremity Assessment: Overall WFL for tasks assessed   Lower Extremity Assessment Lower Extremity Assessment: Defer to PT evaluation   Cervical / Trunk Assessment Cervical / Trunk Assessment: Kyphotic    Communication Communication Communication: Impaired Factors Affecting Communication: Hearing impaired   Cognition Arousal: Alert Behavior During Therapy: Anxious Cognition: History of cognitive impairments             OT - Cognition Comments: Pt appears to be at baseline.  Pt with dementia and out of his environment so dementia appears more profound in hospital but most likely at baseline.                 Following commands: Impaired Following commands impaired: Follows one step commands inconsistently, Follows one step commands with increased time     Cueing  General Comments   Cueing Techniques: Verbal cues;Tactile cues  Pt appears more limited this am after not being up much over the weekend. Pt will require at least mod A of 1 at home for adls and all mobility.   Exercises     Shoulder Instructions      Home Living Family/patient expects to be discharged to:: Private residence Living Arrangements: Spouse/significant other Available Help at Discharge: Family;Available 24 hours/day Type of Home: House Home Access: Stairs to enter [employer] of Steps: 1 Entrance Stairs-Rails: None Home Layout: Multi-level;Bed/bath upstairs Alternate Level Stairs-Number of Steps: 7 Alternate Level Stairs-Rails: Right Bathroom Shower/Tub: Producer, television/film/video: Standard Bathroom Accessibility: Yes   Home Equipment: Agricultural consultant (2 wheels);Cane - single point;Rollator (4 wheels);BSC/3in1   Additional Comments: Info from chart review  Prior Functioning/Environment Prior Level of Function : Needs assist  Cognitive Assist : ADLs (cognitive)   ADLs (Cognitive): Intermittent cues Physical Assist : ADLs (physical)   ADLs (physical): IADLs Mobility Comments: pt ambulates with use of SPC, refuses to utilize walker. At times pt and family utilize rollator as a wheelchair ADLs Comments: assist for IADLs, intermittent assist for ADLs    OT  Problem List: Decreased activity tolerance;Impaired balance (sitting and/or standing);Decreased cognition;Decreased safety awareness;Decreased knowledge of use of DME or AE;Decreased knowledge of precautions;Obesity;Pain   OT Treatment/Interventions:        OT Goals(Current goals can be found in the care plan section)   Acute Rehab OT Goals Patient Stated Goal: none stated OT Goal Formulation: Patient unable to participate in goal setting Time For Goal Achievement: 11/01/23 Potential to Achieve Goals: Fair ADL Goals Pt Will Perform Grooming: standing;with contact guard assist Pt Will Perform Upper Body Dressing: with set-up;sitting Pt Will Perform Lower Body Dressing: with min assist;sit to/from stand Pt Will Transfer to Toilet: with contact guard assist;ambulating;bedside commode;regular height toilet Pt Will Perform Toileting - Clothing Manipulation and hygiene: with min assist;sit to/from stand Pt Will Perform Tub/Shower Transfer: Shower transfer;with min assist;3 in 1;rolling walker;ambulating   OT Frequency:  Min 2X/week    Co-evaluation              AM-PAC OT "6 Clicks" Daily Activity     Outcome Measure Help from another person eating meals?: A Little Help from another person taking care of personal grooming?: A Little Help from another person toileting, which includes using toliet, bedpan, or urinal?: A Lot Help from another person bathing (including washing, rinsing, drying)?: A Lot Help from another person to put on and taking off regular upper body clothing?: A Little Help from another person to put on and taking off regular lower body clothing?: A Lot 6 Click Score: 15   End of Session Equipment Utilized During Treatment: Rolling walker (2 wheels) Nurse Communication: Mobility status;Precautions  Activity Tolerance: Patient limited by fatigue Patient left: in chair;with call bell/phone within reach;with chair alarm set (posey alarm donned and explained to  pt)  OT Visit Diagnosis: Unsteadiness on feet (R26.81);Pain;Other symptoms and signs involving cognitive function Pain - part of body:  (bottom)                Time: 1610-9604 OT Time Calculation (min): 29 min Charges:  OT General Charges $OT Visit: 1 Visit OT Evaluation $OT Eval Moderate Complexity: 1 Mod OT Treatments $Self Care/Home Management : 8-22 mins  Hope Budds 10/18/2023, 9:43 AM

## 2023-10-18 NOTE — Progress Notes (Signed)
 [facility] Progress Note  [name] 89 y.o. 02-24-1934  CC: GI bleed   Subjective: Patient seen and examined at bedside.  Currently sitting in a recliner.  Just finished his physical therapy.  Feeling somewhat tired.  Denies any blood in the stool or black stool.  Currently having brown stool.  Got no more pulmonary to go over pulmonary took over  ROS : Somewhat fatigued and confused   Objective: Vital signs in last 24 hours: Vitals:   10/18/23 0450 10/18/23 0730  BP: (!) 145/64   Pulse: 86   Resp: 19 19  Temp: (!) 97.3 F (36.3 C) (!) 97.3 F (36.3 C)  SpO2: 94%     Physical Exam: Patient appears to be somewhat fatigued and confused.  Abdominal exam benign.  Mood and affect normal.  Lab Results: Recent Labs    10/16/23 0629 10/17/23 1059 10/18/23 0312  NA 141 143 142  K 5.0 3.7 4.0  CL 108 116* 108  CO2 21* 19* 23  GLUCOSE 122* 130* 117*  BUN 52* 54* 50*  CREATININE 1.94* 1.72* 1.95*  CALCIUM 9.3 7.5* 8.6*  MG 2.3  --   --    Recent Labs    10/15/23 2212 10/16/23 0629  AST 37 41  ALT 24 28  ALKPHOS 74 62  BILITOT 2.4* 2.8*  PROT 6.1* 6.1*  ALBUMIN 3.1* 3.1*   Recent Labs    10/15/23 2212 10/16/23 0629 10/16/23 0900 10/17/23 1059 10/18/23 0312  WBC 10.1 7.3  --  7.8 5.4  NEUTROABS 6.2 4.7  --   --   --   HGB 11.6* 11.1*   < > 9.7* 9.6*  HCT 34.1* 32.0*   < > 28.9* 28.0*  MCV 108.3* 104.6*  --  107.4* 105.3*  PLT 118* 93*  --  89* 77*   < > = values in this interval not displayed.   Recent Labs    10/16/23 0629  LABPROT 16.4*  INR 1.3*      Assessment/Plan: -Upper GI bleed.  EGD on October 16, 2023 showed large esophageal varices with red wale signs.  3 bands were placed.  There was clotted blood in the cardia so gastric varices cannot be ruled out. - Cirrhosis of the liver.  Patient with clinical features of cirrhosis of the liver with thrombocytopenia, mild elevated INR and varices on EGD.  CT did showed mild nodularity of  the liver. - Dementia - History of coronary artery disease, CVA and paroxysmal atrial fibrillation  Recommendations --------------------------- - Continue supportive care with octreotide and PPI for now. - Not sure if he received antibiotics during hospitalization for variceal bleeding, but he may be out of window of getting benefit of antibiotics now. - Hemoglobin stable compared to yesterday. -Recommend to continue octreotide for total 72 hours. -If no further bleeding or if hemoglobin stable, okay to discharge home tomorrow from GI standpoint. - He will need outpatient workup with Dr. Dulce Sellar for further evaluation of cause for his cirrhosis and further surveillance for his cirrhosis.  GI will sign off.  Call us back if needed.    Kathi Der MD, FACP 10/18/2023, 8:40 AM  Contact #  [phone]

## 2023-10-18 NOTE — Assessment & Plan Note (Signed)
-   Continue Armour

## 2023-10-18 NOTE — Assessment & Plan Note (Signed)
 Mitral calcification/stenosis Known mitral stenosis and Aortic stenosis.  Last echo 2 months ago.   - IV fluids, avoid dehydration

## 2023-10-18 NOTE — Assessment & Plan Note (Signed)
 Cr stable around apparent baseline 1.6

## 2023-10-18 NOTE — Progress Notes (Signed)
 Mobility Specialist Progress Note:   10/18/23 0910  Mobility  Activity Stood at bedside  Level of Assistance Minimal assist, patient does 75% or more  Assistive Device Front wheel walker  Distance Ambulated (ft)  (unable d/t dizziness and kne buckling)  Activity Response Tolerated fair  Mobility Referral Yes  Mobility visit 1 Mobility  Mobility Specialist Start Time (ACUTE ONLY) 0910  Mobility Specialist Stop Time (ACUTE ONLY) 0920  Mobility Specialist Time Calculation (min) (ACUTE ONLY) 10 min   Pt agreeable to mobility session, despite recent OT session. Required minA to stand from chair and contact assist to static stand. Once standing, pt c/o moderate dizziness, then displayed bilateral knee buckling. BP 150s/70s, further mobility deferred. Pt back in chair with all needs met, alarm on.   Addison Lank Mobility Specialist Please contact via SecureChat or  Rehab office at [phone]

## 2023-10-18 NOTE — TOC Initial Note (Signed)
 Transition of Care [facility]) - Initial/Assessment Note    Patient Details  Name: [name] MRN: [id number] Date of Birth: [birth year]  [facility] CM/SW Contact:    Michaela Corner, LCSWA Phone Number: 10/18/2023, 3:08 PM  Clinical Narrative:    CSW met patient and spouse at bedside and provided them with transportation resources. CSW advised patients wife to contact insurance company to inquire about transportation benefits.         TOC will continue to follow.    Expected Discharge Plan: Home w Home Health Services Barriers to Discharge: Continued Medical Work up   Patient Goals and CMS Choice Patient states their goals for this hospitalization and ongoing recovery are:: To get better          Expected Discharge Plan and Services In-house Referral: Clinical Social Work     Living arrangements for the past 2 months: Single Family Home                                      Prior Living Arrangements/Services Living arrangements for the past 2 months: Single Family Home Lives with:: Spouse Patient language and need for interpreter reviewed:: Yes Do you feel safe going back to the place where you live?: Yes      Need for Family Participation in Patient Care: Yes (Comment) Care giver support system in place?: Yes (comment) Current home services: DME (RW and Cane at the home) Criminal Activity/Legal Involvement Pertinent to Current Situation/Hospitalization: No - Comment as needed  Activities of Daily Living   ADL Screening (condition at time of admission) Independently performs ADLs?: Yes (appropriate for developmental age) Is the patient deaf or have difficulty hearing?: Yes (hearing aids) Does the patient have difficulty seeing, even when wearing glasses/contacts?: No Does the patient have difficulty concentrating, remembering, or making decisions?: Yes  Permission Sought/Granted Permission sought to share information with : Family Supports,  Oceanographer granted to share information with : Yes, Verbal Permission Granted  Share Information with NAME: Deloris  Permission granted to share info w AGENCY: Home Health Agency  Permission granted to share info w Relationship: Spouse     Emotional Assessment Appearance:: Appears stated age Attitude/Demeanor/Rapport: Unable to Assess Affect (typically observed): Stable Orientation: : Oriented to Self Alcohol / Substance Use: Not Applicable Psych Involvement: No (comment)  Admission diagnosis:  Acute upper GI bleed [K92.2] Patient Active Problem List   Diagnosis Date Noted   Dementia without behavioral disturbance (HCC) 10/18/2023   Aortic stenosis 10/18/2023   Esophageal varices with bleeding (HCC) 10/16/2023   Lumbar stress fracture 10/16/2023   Acute metabolic encephalopathy 07/20/2023   Thrombocytopenia (HCC) 07/19/2023   Influenza A 07/19/2023   Pacemaker 07/19/2023   Second degree heart block 08/11/2022   Sinus arrest 08/11/2022   Cardiac syncope 08/11/2022   Syncope and collapse 08/10/2022   DM2 (diabetes mellitus, type 2) (HCC) 09/09/2020   Chest pain 09/08/2020   Acute diverticulitis 02/07/2019   Angina pectoris, variant (HCC) 05/19/2018   Poor compliance with CPAP treatment 04/20/2018   Excessive daytime sleepiness 03/22/2018   Poor sleep hygiene 03/22/2018   Ventral hernia without obstruction or gangrene 10/09/2017   TIA (transient ischemic attack)    Kidney stone    Hypertension    Hearing aid worn    Coronary artery disease involving native coronary artery of native heart    Asthma due  to seasonal allergies    Arthritis    Erosive gastropathy 03/26/2017   Acute blood loss anemia 03/25/2017   Melena 03/24/2017   Chronic kidney disease, stage 3b (HCC) 03/24/2017   AF (paroxysmal atrial fibrillation) (HCC) 03/24/2017   Angina pectoris (HCC) 10/09/2015   Essential hypertension    Hyperlipemia    Carotid artery occlusion     Dizziness 08/09/2015   Gait instability 05/30/2015   Hypothyroidism, acquired 10/26/2013   OSA (obstructive sleep apnea) 10/18/2013   Benign localized hyperplasia of prostate with urinary obstruction and other lower urinary tract symptoms (LUTS)(600.21) 12/21/2012   Carcinoma in situ of prostate 12/21/2012   Left shoulder pain 08/08/2012   Class 1 obesity due to excess calories with serious comorbidity and body mass index (BMI) of 34.0 to 34.9 in adult 07/24/2011   Generalized anxiety disorder 07/24/2011   Diverticulosis of colon 12/25/2009   DERMATITIS, SEBORRHEIC 12/25/2009   Noise-induced hearing loss 12/18/2009   Memory loss 08/14/2009   Venous (peripheral) insufficiency 09/09/2006   GERD without esophagitis 09/09/2006   Diaphragmatic hernia 09/09/2006   INSOMNIA NOS 09/09/2006   PCP:  Pearline Cables, MD Pharmacy:   [facility] [address] [place] [address] Phone: [phone] Fax: [phone]  [facility] [address] [place], [place] - [address] [place] [facility] [address] [place] [address] Phone: [phone] Fax: [phone]  [facility] [address], [place] - [address] [place] [address] Phone: [phone] Fax: [phone]  [id number] [place], [place] - [address] [place] [address] Phone: [phone] Fax: [phone]  [facility] [address] [place] - [address] [place] [address] Phone: [phone] Fax: [phone]     Social Drivers of Health (SDOH) Social History: SDOH Screenings   Food Insecurity: No Food Insecurity (10/16/2023)  Housing: Low Risk  (10/16/2023)  Transportation Needs: Unmet Transportation Needs (10/16/2023)  Utilities: Not At Risk (10/16/2023)  Alcohol Screen: Low Risk  (01/12/2022)  Depression (PHQ2-9): Low Risk  (01/12/2022)   Financial Resource Strain: Low Risk  (09/18/2019)  Physical Activity: Inactive (01/12/2022)  Social Connections: Moderately Integrated (10/16/2023)  Stress: No Stress Concern Present (01/12/2022)  Tobacco Use: Medium Risk (10/16/2023)   SDOH Interventions:     Readmission Risk Interventions    08/19/2022    3:35 PM  Readmission Risk Prevention Plan  Transportation Screening Complete  PCP or Specialist Appt within 5-7 Days Complete  Home Care Screening Complete  Medication Review (RN CM) Complete

## 2023-10-18 NOTE — Progress Notes (Signed)
 Patient pulled his PIV. RN and NT cleaned up the patient. MD notified. Vitals are all stable BP 156/75 Pulse 94. Temp 97.9 o2 sat 94 RA. Wife updated on the phone and talked to the patient as well. RN provided re orientation to the patient.

## 2023-10-18 NOTE — Assessment & Plan Note (Signed)
 Cerebrovascular disease Hyperlipidemia Hypertension BP normal, not on antihypertensives at baseline anymore - Hold aspirin - Continue home [place]

## 2023-10-18 NOTE — Progress Notes (Signed)
 Patient set off bed alarm x5 after wife left. This RN took patient on a walk in the halls. Patient AOx1 trying to enter other patients rooms and screaming for the police. Six RN had to assist patient to a wheelchair and back to bed. Reality orientation given to patient however he has a history of dementia and it appears to get worse at night. MD paged and made aware. Waist belt non-violent restraint ordered and loosely placed on patient for patient safety.

## 2023-10-19 DIAGNOSIS — I8511 Secondary esophageal varices with bleeding: Secondary | ICD-10-CM | POA: Diagnosis not present

## 2023-10-19 LAB — GLUCOSE, CAPILLARY: Glucose-Capillary: 113 mg/dL — ABNORMAL HIGH (ref 70–99)

## 2023-10-19 MED ORDER — PANTOPRAZOLE SODIUM 40 MG PO TBEC: 40.0000 mg | DELAYED_RELEASE_TABLET | Freq: Every day | ORAL | 0 refills | Status: DC

## 2023-10-19 NOTE — Plan of Care (Signed)
  Problem: Education: Goal: Ability to describe self-care measures that may prevent or decrease complications (Diabetes Survival Skills Education) will improve Outcome: Not Progressing Goal: Individualized Educational Video(s) Outcome: Not Applicable   Problem: Coping: Goal: Ability to adjust to condition or change in health will improve Outcome: Not Progressing   Problem: Fluid Volume: Goal: Ability to maintain a balanced intake and output will improve Outcome: Not Progressing   Problem: Health Behavior/Discharge Planning: Goal: Ability to identify and utilize available resources and services will improve Outcome: Not Progressing Goal: Ability to manage health-related needs will improve Outcome: Not Progressing   Problem: Metabolic: Goal: Ability to maintain appropriate glucose levels will improve Outcome: Not Progressing   Problem: Nutritional: Goal: Maintenance of adequate nutrition will improve Outcome: Not Progressing Goal: Progress toward achieving an optimal weight will improve Outcome: Not Progressing   Problem: Skin Integrity: Goal: Risk for impaired skin integrity will decrease Outcome: Not Progressing   Problem: Tissue Perfusion: Goal: Adequacy of tissue perfusion will improve Outcome: Not Progressing   Problem: Education: Goal: Knowledge of General Education information will improve Description: Including pain rating scale, medication(s)/side effects and non-pharmacologic comfort measures Outcome: Not Progressing   Problem: Health Behavior/Discharge Planning: Goal: Ability to manage health-related needs will improve Outcome: Not Progressing   Problem: Clinical Measurements: Goal: Ability to maintain clinical measurements within normal limits will improve Outcome: Not Progressing Goal: Will remain free from infection Outcome: Not Progressing Goal: Diagnostic test results will improve Outcome: Not Progressing Goal: Respiratory complications will  improve Outcome: Not Progressing Goal: Cardiovascular complication will be avoided Outcome: Not Progressing   Problem: Activity: Goal: Risk for activity intolerance will decrease Outcome: Not Progressing   Problem: Nutrition: Goal: Adequate nutrition will be maintained Outcome: Not Progressing

## 2023-10-19 NOTE — Plan of Care (Signed)
  Problem: Coping: Goal: Ability to adjust to condition or change in health will improve Outcome: Progressing   Problem: Health Behavior/Discharge Planning: Goal: Ability to identify and utilize available resources and services will improve Outcome: Progressing   Problem: Nutritional: Goal: Maintenance of adequate nutrition will improve Outcome: Progressing

## 2023-10-19 NOTE — TOC Transition Note (Signed)
 Transition of Care [facility]) - Discharge Note   Patient Details  Name: [name] MRN: [id number] Date of Birth: 23-Jul-1933  Transition of Care [facility]) CM/SW Contact:  Leone Haven, RN Phone Number: 10/19/2023, 10:40 AM   Clinical Narrative:    For dc today, wife at bedside to transport home, she states they do not want any HH services and she informed MD of this as well.  [facility] is PCP, they get their meds form [facility] on [facility].     Final next level of care: Home/Self Care Barriers to Discharge: No Barriers Identified   Patient Goals and CMS Choice Patient states their goals for this hospitalization and ongoing recovery are:: to get better CMS Medicare.gov Compare Post Acute Care list provided to:: Patient Represenative (must comment) Choice offered to / list presented to : Spouse      Discharge Placement                       Discharge Plan and Services Additional resources added to the After Visit Summary for   In-house Referral: Clinical Social Work Discharge Planning Services: CM Consult Post Acute Care Choice: NA          DME Arranged: N/A         HH Arranged: NA          Social Drivers of Health (SDOH) Interventions SDOH Screenings   Food Insecurity: No Food Insecurity (10/16/2023)  Housing: Low Risk  (10/16/2023)  Transportation Needs: Unmet Transportation Needs (10/16/2023)  Utilities: Not At Risk (10/16/2023)  Alcohol Screen: Low Risk  (01/12/2022)  Depression (PHQ2-9): Low Risk  (01/12/2022)  Financial Resource Strain: Low Risk  (09/18/2019)  Physical Activity: Inactive (01/12/2022)  Social Connections: Moderately Integrated (10/16/2023)  Stress: No Stress Concern Present (01/12/2022)  Tobacco Use: Medium Risk (10/16/2023)     Readmission Risk Interventions    10/19/2023   10:35 AM 08/19/2022    3:35 PM  Readmission Risk Prevention Plan  Transportation Screening Complete Complete  PCP or Specialist Appt within 5-7 Days Complete Complete   Home Care Screening Complete Complete  Medication Review (RN CM) Complete Complete

## 2023-10-19 NOTE — Care Management Important Message (Signed)
 Important Message  Patient Details  Name: [name] MRN: [id number] Date of Birth: 25-Feb-1934   Important Message Given:  Yes - Medicare IM     Renie Ora 10/19/2023, 10:00 AM

## 2023-10-19 NOTE — Discharge Summary (Signed)
 Physician Discharge Summary   Patient: [name] MRN: [id number] DOB: Sep 03, 1933  Admit date:     10/15/2023  Discharge date: 10/19/23  Discharge Physician: [name]   PCP: Zachary Cables, MD     Recommendations at discharge:  Follow up with Gastroenterology Dr. Dulce King for cirrhosis evaluation and varices follow up Follow up with Dr. Patsy King for blood loss anemia/variceal bleeding, dementia Dr. Patsy King: Please check CBC in 1 week (discharge Hgb 9.6 g/dL, platelets 29B)     Discharge Diagnoses: Principal Problem:   Esophageal varices with bleeding (HCC) Active Problems:   Acute blood loss anemia   Cirrhosis   Thrombocytopenia (HCC)   Acute metabolic encephalopathy   Chronic kidney disease, stage 3b (HCC)   AF (paroxysmal atrial fibrillation) (HCC)   Class 1 obesity due to excess calories with serious comorbidity and body mass index (BMI) of 34.0 to 34.9 in adult   Hypothyroidism, acquired   Essential hypertension   Hyperlipemia   Coronary artery disease involving native coronary artery of native heart   Lumbar stress fracture   Dementia without behavioral disturbance (HCC)   Aortic stenosis      Hospital Course: 89 y.o. M with moderate dementia, lives at home, pAF not on [name] due to hx GIB, CAD s/p PCI remote, HLD, TIA and PVD s/p CEA, HTN, obesity, and prosCA who presented with vomiting blood.   Evidently was in his usual state of health until the evening of admission when he became acutely weak and pale, then vomited some blood.   In the ER, blood pressure 80/30, and vomited a large amount of blood.  Started on IV PPI, IV fluids, GI consulted.   EGD on 4/5 showed several varices with red wale sign.  3 bands applied.       * Esophageal varices with bleeding (HCC) Acute blood loss anemia  The patient was admitted and GI were consulted, he underwent EGD that showed varices with stigmata of recent bleeding, 3 bands were applied.  Afterwards  patient's hemoglobin remained stable, he had brown bowel movements, no further hematemesis.  He was treated with 72 hours of octreotide infusion and IV PPI  Discharged on oral PPI, with GI follow-up.  Expected cirrhosis Thrombocytopenia (HCC) No prior history of cirrhosis.  Given varices, thrombocytopenia, low albumin, would favor that he has some degree of cirrhosis.  GI plan outpatient workup.  No evidence of hepatic encephalopathy here nor ascites.  Defer nadolol, diuretics, lactulose to GI    AF (paroxysmal atrial fibrillation) (HCC) Not on AC due to prior GIB. In sinus here.   Chronic kidney disease, stage 3b (HCC) Cr stable around apparent baseline 1.6-1.8  Aortic stenosis Mitral calcification/stenosis Known mitral stenosis and Aortic stenosis.  Last echo 2 months ago.    Dementia without behavioral disturbance (HCC) Moderate, lives at home. At baseline, oriented to place and self, dresses self. Probably ~FAST 88-4   Lumbar stress fracture Compression fx diagnosed in Jan.  Has been using NSAIDs - Avoid NSAIDs with varices  Acute metabolic encephalopathy Patient had sundowning related to dementia. No psychomotor slowing to suggest HE.  Coronary artery disease involving native coronary artery of native heart Cerebrovascular disease Hyperlipidemia Hypertension PVD s/p CEA BP normal, not on antihypertensives at baseline anymore. Aspirin stopped at discharge  Hypothyroidism, acquired On [place] thyroid            The [name] Controlled Substances Registry was reviewed for this patient prior to discharge.   Consultants:  Gastroenterology Procedures performed: GD Disposition: Home health Diet recommendation:  Cardiac diet  DISCHARGE MEDICATION: Allergies as of 10/19/2023       Reactions   Ciprofloxacin Other (See Comments)   Hallucinations or jitteriness   Codeine Other (See Comments)   Hallucinations   Flexeril [cyclobenzaprine] Other (See  Comments)   "Made me feel goofy"   Imdur [isosorbide Nitrate] Other (See Comments)   Caused headaches   Methocarbamol Other (See Comments)   "Made me feel goofy"   Other Other (See Comments)   CANNOT HAVE ANY FOODS WITH SEEDS   Strawberry Extract Other (See Comments)   CANNOT HAVE ANY FOODS WITH SEEDS   Sulfa Antibiotics Other (See Comments)   Chills and shaking- "serum sickness"   Sulfamethoxazole Other (See Comments)   Chills and shaking- "serum sickness"   Sulfonamide Derivatives Other (See Comments)   Chills and shaking "serum sickness"   Tomato Other (See Comments)   CANNOT HAVE ANY FOODS WITH SEEDS   Flagyl [metronidazole] Rash        Medication List     STOP taking these medications    aspirin EC 81 MG tablet   IBUPROFEN PO       TAKE these medications    acetaminophen 500 MG tablet Commonly known as: TYLENOL Take 500 mg by mouth every 6 (six) hours as needed for moderate pain (pain score 4-6).   blood glucose meter kit and supplies Kit Dispense based on patient and insurance preference. Use up to four times daily as directed.   dapagliflozin propanediol 5 MG Tabs tablet Commonly known as: Farxiga Take 1 tablet (5 mg total) by mouth daily.   guaiFENesin-dextromethorphan 100-10 MG/5ML syrup Commonly known as: ROBITUSSIN DM Take 10 mLs by mouth every 4 (four) hours as needed for cough (chest congestion).   NP Thyroid 120 MG tablet Generic drug: thyroid TAKE ONE TABLET BY MOUTH ONE TIME DAILY BEFORE BREAKFAST   pantoprazole 40 MG tablet Commonly known as: Protonix Take 1 tablet (40 mg total) by mouth daily.        Follow-up Information     King, Zachary Found, MD Follow up.   Specialty: Family Medicine Why: April 21st @ 1:40 PM Contact information: [address] [place] [address] [phone]         Zachary Modena, MD. Schedule an appointment as soon as possible for a visit.   Specialty: Gastroenterology Contact  information: [address] [place] [address] [phone]                 Discharge Instructions     Discharge instructions   Complete by: As directed    **IMPORTANT DISCHARGE INSTRUCTIONS**   From Dr. Maryfrances King: You were admitted for intestinal bleeding from "esophageal varices" Varices are enlarged blood vessels that develop in the chest, usually due to cirrhosis Given that Mr. Hostetler has never been diagnosed with cirrhosis before, this needs follow up with GI for more explanation  While you were here, you had an endoscopy and Dr. Dulce King "banded" three varices to prevent future bleeding and you were treated with antacid medicine (protonix) and an infusion to constrict abdominal blood vessels (octreotide)  Continue pantoprazole/Protonix 40 mg daily for now  Call Dr. Hulen Shouts office to arrange a follow up  Resume your home medicines but HOLD aspirin for now  Ask Dr. Dulce King if you can or should resume aspirin, given that Mr. Christen has had mini-strokes and a carotid endarterectomy  but not for a long time  IMPORTANT: given this bleeding, you should avoid NSAIDs.  Aspirin is an NSAID, but there are important medical indications why one might want to continue this.  OTHER NSAIDs however, are not okay.  Do NOT take ibuprofen, Motrin, Advil, Aleve, naproxen.  If you are prescribed pain medicine by a doctor, make sure they know not to prescribe you NSAIDs (except in certain very limited situations)  Go see Dr. Jeanella Craze in 1 week and get labs checked  You may resume a normal diet   Increase activity slowly   Complete by: As directed    No wound care   Complete by: As directed        Discharge Exam: Filed Weights   10/16/23 1218 10/18/23 0450 10/19/23 0419  Weight: 88 kg 83.1 kg 82.9 kg    General: Patient is awake, somewhat disoriented and forgetful.  No focal symptoms, just groggy. Cardiovascular: RRR, nl S1-S2, soft systolic murmur noted.   No LE edema.    Respiratory: Normal respiratory rate and rhythm.  CTAB without rales or wheezes. Abdominal: Abdomen soft and non-tender.  No distension or HSM.   Neuro/Psych: Strength symmetric in upper and lower extremities.  Judgment and insight appear.  But close to baseline.   Condition at discharge: fair  The results of significant diagnostics from this hospitalization (including imaging, microbiology, ancillary and laboratory) are listed below for reference.   Imaging Studies: CT ABDOMEN PELVIS W CONTRAST Result Date: 10/15/2023 CLINICAL DATA:  Acute abdominal pain EXAM: CT ABDOMEN AND PELVIS WITH CONTRAST TECHNIQUE: Multidetector CT imaging of the abdomen and pelvis was performed using the standard protocol following bolus administration of intravenous contrast. RADIATION DOSE REDUCTION: This exam was performed according to the departmental dose-optimization program which includes automated exposure control, adjustment of the mA and/or kV according to patient size and/or use of iterative reconstruction technique. CONTRAST:  75mL OMNIPAQUE IOHEXOL 350 MG/ML SOLN COMPARISON:  08/10/2022 FINDINGS: Lower chest: No acute abnormality. Hepatobiliary: Gallbladder has been surgically removed. Left hepatic cyst is noted. Mild nodularity of the liver is seen. Pancreas: Unremarkable. No pancreatic ductal dilatation or surrounding inflammatory changes. Spleen: Normal in size without focal abnormality. Adrenals/Urinary Tract: Adrenal glands are within normal limits. Kidneys show the left kidney have a normal enhancement pattern. No calculi are seen. Right kidney is atrophic with a stable 8 mm nonobstructing stone. The ureters are within normal limits. The bladder is partially distended. Stomach/Bowel: Scattered diverticular change of the colon is noted without evidence of diverticulitis. The appendix is within normal limits. Small bowel and stomach are unremarkable. Vascular/Lymphatic: Aortic atherosclerosis. No enlarged  abdominal or pelvic lymph nodes. Reproductive: Prostate has been surgically removed. Other: No abdominal wall hernia or abnormality. No abdominopelvic ascites. Musculoskeletal: No acute or significant osseous findings. Chronic L1 compression deformity is noted. IMPRESSION: Diverticulosis without diverticulitis. Atrophic right kidney. No acute abnormality noted. Electronically Signed   By: Alcide Clever M.D.   On: 10/15/2023 23:56   CT Head Wo Contrast Result Date: 10/15/2023 CLINICAL DATA:  TIA EXAM: CT HEAD WITHOUT CONTRAST TECHNIQUE: Contiguous axial images were obtained from the base of the skull through the vertex without intravenous contrast. RADIATION DOSE REDUCTION: This exam was performed according to the departmental dose-optimization program which includes automated exposure control, adjustment of the mA and/or kV according to patient size and/or use of iterative reconstruction technique. COMPARISON:  None Available. FINDINGS: Brain: No evidence of acute infarction, hemorrhage, hydrocephalus, extra-axial collection or mass lesion/mass effect. There  is moderate diffuse atrophy. There is mild periventricular white matter hypodensity, likely chronic small vessel ischemic change. Vascular: Atherosclerotic calcifications are present within the cavernous internal carotid arteries. Skull: Normal. Negative for fracture or focal lesion. Sinuses/Orbits: No acute finding. Other: None. IMPRESSION: 1. No acute intracranial process. Moderate diffuse atrophy. Mild chronic small vessel ischemic change. Electronically Signed   By: Darliss Cheney M.D.   On: 10/15/2023 23:51    Microbiology: Results for orders placed or performed during the hospital encounter of 07/19/23  Resp panel by RT-PCR (RSV, Flu A&B, Covid) Anterior Nasal Swab     Status: Abnormal   Collection Time: 07/19/23  4:43 PM   Specimen: Anterior Nasal Swab  Result Value Ref Range Status   SARS Coronavirus 2 by RT PCR NEGATIVE NEGATIVE Final    Influenza A by PCR POSITIVE (A) NEGATIVE Final   Influenza B by PCR NEGATIVE NEGATIVE Final    Comment: (NOTE) The Xpert Xpress SARS-CoV-2/FLU/RSV plus assay is intended as an aid in the diagnosis of influenza from Nasopharyngeal swab specimens and should not be used as a sole basis for treatment. Nasal washings and aspirates are unacceptable for Xpert Xpress SARS-CoV-2/FLU/RSV testing.  Fact Sheet for Patients: BloggerCourse.com  Fact Sheet for Healthcare Providers: SeriousBroker.it  This test is not yet approved or cleared by the [place] FDA and has been authorized for detection and/or diagnosis of SARS-CoV-2 by FDA under an Emergency Use Authorization (EUA). This EUA will remain in effect (meaning this test can be used) for the duration of the COVID-19 declaration under Section 564(b)(1) of the Act, 21 U.S.C. section 360bbb-3(b)(1), unless the authorization is terminated or revoked.     Resp Syncytial Virus by PCR NEGATIVE NEGATIVE Final    Comment: (NOTE) Fact Sheet for Patients: BloggerCourse.com  Fact Sheet for Healthcare Providers: SeriousBroker.it  This test is not yet approved or cleared by the [place] FDA and has been authorized for detection and/or diagnosis of SARS-CoV-2 by FDA under an Emergency Use Authorization (EUA). This EUA will remain in effect (meaning this test can be used) for the duration of the COVID-19 declaration under Section 564(b)(1) of the Act, 21 U.S.C. section 360bbb-3(b)(1), unless the authorization is terminated or revoked.  Performed at [facility], [address], [place], [place] [address]     Labs: CBC: Recent Labs  Lab 10/15/23 2212 10/16/23 0629 10/16/23 0900 10/16/23 1453 10/17/23 1059 10/18/23 0312  WBC 10.1 7.3  --   --  7.8 5.4  NEUTROABS 6.2 4.7  --   --   --   --   HGB 11.6* 11.1* 11.1* 10.5* 9.7*  9.6*  HCT 34.1* 32.0* 32.0* 30.5* 28.9* 28.0*  MCV 108.3* 104.6*  --   --  107.4* 105.3*  PLT 118* 93*  --   --  89* 77*   Basic Metabolic Panel: Recent Labs  Lab 10/15/23 2212 10/16/23 0629 10/17/23 1059 10/18/23 0312  NA 137 141 143 142  K 4.8 5.0 3.7 4.0  CL 105 108 116* 108  CO2 21* 21* 19* 23  GLUCOSE 140* 122* 130* 117*  BUN 33* 52* 54* 50*  CREATININE 1.73* 1.94* 1.72* 1.95*  CALCIUM 8.9 9.3 7.5* 8.6*  MG  --  2.3  --   --    Liver Function Tests: Recent Labs  Lab 10/15/23 2212 10/16/23 0629  AST 37 41  ALT 24 28  ALKPHOS 74 62  BILITOT 2.4* 2.8*  PROT 6.1* 6.1*  ALBUMIN 3.1* 3.1*  CBG: Recent Labs  Lab 10/17/23 2105 10/18/23 0622 10/18/23 1554 10/18/23 2143 10/19/23 0624  GLUCAP 103* 138* 107* 115* 113*    Discharge time spent: approximately 45 minutes spent on discharge counseling, evaluation of patient on day of discharge, and coordination of discharge planning with nursing, social work, pharmacy and case management  Signed: Alberteen Sam, MD [facility] 10/19/2023

## 2023-10-20 ENCOUNTER — Telehealth: Payer: Self-pay

## 2023-10-20 NOTE — Transitions of Care (Post Inpatient/ED Visit) (Signed)
   10/20/2023  Name: [name] MRN: [id number] DOB: July 31, 1933  Today's TOC FU Call Status: Today's TOC FU Call Status:: Successful TOC FU Call Completed TOC FU Call Complete Date: 10/20/23 Patient's Name and Date of Birth confirmed.  Transition Care Management Follow-up Telephone Call Date of Discharge: 10/19/23 Discharge Facility: [facility]) Type of Discharge: Inpatient Admission Primary Inpatient Discharge Diagnosis:: GI bleed How have you been since you were released from the hospital?: Better  Items Reviewed: Did you receive and understand the discharge instructions provided?: No Medications obtained,verified, and reconciled?: Yes (Medications Reviewed) Any new allergies since your discharge?: No Dietary orders reviewed?: Yes Do you have support at home?: Yes People in Home [RPT]: spouse  Medications Reviewed Today: Medications Reviewed Today     Reviewed by Karena Addison, LPN (Licensed Practical Nurse) on 10/20/23 at 0930  Med List Status: <None>   Medication Order Taking? Sig Documenting Provider Last Dose Status Informant  acetaminophen (TYLENOL) 500 MG tablet [id number] No Take 500 mg by mouth every 6 (six) hours as needed for moderate pain (pain score 4-6). [provider] Unknown Active Spouse/Significant Other, Pharmacy Records  blood glucose meter kit and supplies KIT [id number] No Dispense based on patient and insurance preference. Use up to four times daily as directed. Joseph Art, DO Taking Active Spouse/Significant Other, Pharmacy Records  dapagliflozin propanediol (FARXIGA) 5 MG TABS tablet [id number] No Take 1 tablet (5 mg total) by mouth daily. Copland, Gwenlyn Found, MD 10/15/2023 Active Spouse/Significant Other, Pharmacy Records  guaiFENesin-dextromethorphan [facility] DM) 100-10 MG/5ML syrup [id number] No Take 10 mLs by mouth every 4 (four) hours as needed for cough (chest congestion). Leatha Gilding, MD Unknown Active Spouse/Significant Other,  Pharmacy Records  NP THYROID 120 MG tablet [id number] No TAKE ONE TABLET BY MOUTH ONE TIME DAILY BEFORE BREAKFAST Copland, Gwenlyn Found, MD 10/15/2023 Active Spouse/Significant Other, Pharmacy Records  pantoprazole (PROTONIX) 40 MG tablet [id number]  Take 1 tablet (40 mg total) by mouth daily. Alberteen Sam, MD  Active             Home Care and Equipment/Supplies: Were Home Health Services Ordered?: NA Any new equipment or medical supplies ordered?: NA  Functional Questionnaire: Do you need assistance with bathing/showering or dressing?: No Do you need assistance with meal preparation?: No Do you need assistance with eating?: No Do you have difficulty maintaining continence: No Do you need assistance with getting out of bed/getting out of a chair/moving?: No Do you have difficulty managing or taking your medications?: No  Follow up appointments reviewed: PCP Follow-up appointment confirmed?: Yes Date of PCP follow-up appointment?: 11/01/23 Follow-up Provider: [facility] Follow-up appointment confirmed?: No Reason Specialist Follow-Up Not Confirmed: Patient has Specialist Provider Number and will Call for Appointment Do you need transportation to your follow-up appointment?: No Do you understand care options if your condition(s) worsen?: Yes-patient verbalized understanding    SIGNATURE Karena Addison, LPN [facility] Nurse Health Advisor Direct Dial [phone]

## 2023-10-26 ENCOUNTER — Telehealth: Payer: Self-pay

## 2023-10-26 NOTE — Telephone Encounter (Signed)
 Copied from CRM [id number]. Topic: General - Other >> Oct 26, 2023 11:09 AM [place] A wrote: Reason for CRM: Patient's daughter Donnia Galas called because she was worried that when the patient comes for his appt on 11/01/23 that he will ask Dr.Copland that he wants to continue to drive and may become upset when informed that it is not safe for him to drive.

## 2023-10-29 NOTE — Patient Instructions (Addendum)
 It was good to see you again today- I am glad you are doing better!   I will be in touch with your labs asap We will get you seen by GI for a recheck visit Appt with Dr Kimble Pennant -GI 5/15 at 9:30 am  Address: [address], [place], [place] [address] Phone: [phone]  Let me know if any changes, otherwise let's visit in 4-6 months No driving please

## 2023-10-29 NOTE — Progress Notes (Addendum)
 [facility] [address], [address] [place], [place] [address] [phone]  Date:  11/01/2023   Name:  [name]   DOB:  1933/09/05   MRN:  [id number]  PCP:  Zachary Partridge, MD    Chief Complaint: No chief complaint on file.   History of Present Illness:  [name] is a 89 y.o. very pleasant male patient who presents with the following:  Patient seen today for hospital follow-up-I last saw him about a year ago Carotid occlusion status post endarterectomy, aortic stenosis, sleep apnea, chronic renal disease, hyperlipidemia, memory loss, hard of hearing, prostate cancer status post radical prostatectomy in 1998, hypothyroidism-also chronic stable angina, heart block status post pacemaker His daughter [name] also reached out to me, she is concerned that her father will want to go back to driving and they do not think this is safe  Unfortunately since our last visit he has been admitted a few times. (Admitted for over a week in January 2020 for with second-degree heart block and uncontrolled diabetes-he had a pacemaker placed)  He was then admitted this past January, 2025 for couple of days with encephalopathy and flu a Most recently he was admitted earlier this month with an acute upper GI bleed He was admitted from April 4 through October 19, 2023 with an acute bleed-upper GI showed varices with evidence of recent bleeding There was also concern of some degree of cirrhosis He does have moderate dementia Patient Active Problem List   Diagnosis Date Noted   Dementia without behavioral disturbance (HCC) 10/18/2023   Aortic stenosis 10/18/2023   Esophageal varices with bleeding (HCC) 10/16/2023   Lumbar stress fracture 10/16/2023   Acute metabolic encephalopathy 07/20/2023   Thrombocytopenia (HCC) 07/19/2023   Influenza A 07/19/2023   Pacemaker 07/19/2023   Second degree heart block 08/11/2022   Sinus arrest 08/11/2022    Cardiac syncope 08/11/2022   Syncope and collapse 08/10/2022   DM2 (diabetes mellitus, type 2) (HCC) 09/09/2020   Chest pain 09/08/2020   Acute diverticulitis 02/07/2019   Angina pectoris, variant (HCC) 05/19/2018   Poor compliance with CPAP treatment 04/20/2018   Excessive daytime sleepiness 03/22/2018   Poor sleep hygiene 03/22/2018   Ventral hernia without obstruction or gangrene 10/09/2017   TIA (transient ischemic attack)    Kidney stone    Hypertension    Hearing aid worn    Coronary artery disease involving native coronary artery of native heart    Asthma due to seasonal allergies    Arthritis    Erosive gastropathy 03/26/2017   Acute blood loss anemia 03/25/2017   Melena 03/24/2017   Chronic kidney disease, stage 3b (HCC) 03/24/2017   AF (paroxysmal atrial fibrillation) (HCC) 03/24/2017   Angina pectoris (HCC) 10/09/2015   Essential hypertension    Hyperlipemia    Carotid artery occlusion    Dizziness 08/09/2015   Gait instability 05/30/2015   Hypothyroidism, acquired 10/26/2013   OSA (obstructive sleep apnea) 10/18/2013   Benign localized hyperplasia of prostate with urinary obstruction and other lower urinary tract symptoms (LUTS)(600.21) 12/21/2012   Carcinoma in situ of prostate 12/21/2012   Left shoulder pain 08/08/2012   Class 1 obesity due to excess calories with serious comorbidity and body mass index (BMI) of 34.0 to 34.9 in adult 07/24/2011   Generalized anxiety disorder 07/24/2011   Diverticulosis of colon 12/25/2009   DERMATITIS, SEBORRHEIC 12/25/2009   Noise-induced hearing loss 12/18/2009  Memory loss 08/14/2009   Venous (peripheral) insufficiency 09/09/2006   GERD without esophagitis 09/09/2006   Diaphragmatic hernia 09/09/2006   INSOMNIA NOS 09/09/2006    Past Medical History:  Diagnosis Date   Anginal pain (HCC)    Anxiety    " OCCASIONAL"   Arthritis    Asthma due to seasonal allergies    uses inhalers prn   Carotid artery occlusion     Left   Complication of anesthesia    " had tremors after prostate surgery   Coronary artery disease    Diverticulitis    recurrent   Family history of anesthesia complication    MOTHER    GERD (gastroesophageal reflux disease)    H/O hiatal hernia    Hearing aid worn    Hyperlipemia    Hypertension    Kidney stone    lithotripsy 2011 w complications, req stents, Dr Nolon Baxter   Prostate CA [facility])    Shortness of breath    Thyroid  disease    TIA (transient ischemic attack)    Tinnitus     Past Surgical History:  Procedure Laterality Date   CARDIAC CATHETERIZATION  2008   minimal dz, Dr Floria Hurst   CARDIAC CATHETERIZATION N/A 10/09/2015   Procedure: Left Heart Cath and Coronary Angiography;  Surgeon: Peter M Swaziland, MD;  Location: [facility] INVASIVE CV LAB;  Service: Cardiovascular;  Laterality: N/A;   CARDIAC CATHETERIZATION N/A 10/09/2015   Procedure: Intravascular Pressure Wire/FFR Study;  Surgeon: Peter M Swaziland, MD;  Location: [facility] INVASIVE CV LAB;  Service: Cardiovascular;  Laterality: N/A;   CARDIAC CATHETERIZATION N/A 10/09/2015   Procedure: Coronary Stent Intervention;  Surgeon: Peter M Swaziland, MD;  Location: [facility] INVASIVE CV LAB;  Service: Cardiovascular;  Laterality: N/A;   CAROTID ENDARTERECTOMY  Left   1998   CHOLECYSTECTOMY     CORONARY STENT PLACEMENT  10/09/2015   DES to RCA   ESOPHAGEAL BANDING  10/16/2023   Procedure: ESOPHAGOSCOPY, WITH ESOPHAGEAL VARICES BAND LIGATION;  Surgeon: Evangeline Hilts, MD;  Location: [facility] ENDOSCOPY;  Service: Gastroenterology;;   ESOPHAGOGASTRODUODENOSCOPY Left 10/16/2023   Procedure: EGD (ESOPHAGOGASTRODUODENOSCOPY);  Surgeon: Evangeline Hilts, MD;  Location: [facility] ENDOSCOPY;  Service: Gastroenterology;  Laterality: Left;   ESOPHAGOGASTRODUODENOSCOPY (EGD) WITH PROPOFOL  Left 03/26/2017   Procedure: ESOPHAGOGASTRODUODENOSCOPY (EGD) WITH PROPOFOL ;  Surgeon: Evangeline Hilts, MD;  Location: [facility] ENDOSCOPY;  Service: Endoscopy;  Laterality: Left;   LEFT HEART  CATHETERIZATION WITH CORONARY ANGIOGRAM N/A 09/15/2013   Procedure: LEFT HEART CATHETERIZATION WITH CORONARY ANGIOGRAM;  Surgeon: Peter M Swaziland, MD;  Location: [facility] CATH LAB;  Service: Cardiovascular;  Laterality: N/A;   PACEMAKER IMPLANT N/A 08/12/2022   Procedure: PACEMAKER IMPLANT;  Surgeon: Lei Pump, MD;  Location: [facility] INVASIVE CV LAB;  Service: Cardiovascular;  Laterality: N/A;   PROSTATECTOMY  1998   radical for prostate cancer   TEMPORARY PACEMAKER N/A 08/11/2022   Procedure: TEMPORARY PACEMAKER;  Surgeon: Knox Perl, MD;  Location: [facility] INVASIVE CV LAB;  Service: Cardiovascular;  Laterality: N/A;    Social History   Tobacco Use   Smoking status: Former    Current packs/day: 0.00    Average packs/day: 2.5 packs/day for 30.0 years (75.0 ttl pk-yrs)    Types: Cigarettes    Start date: 08/29/1955    Quit date: 08/28/1985    Years since quitting: 38.1   Smokeless tobacco: Never  Substance Use Topics   Alcohol use: Yes    Alcohol/week: 2.0 standard drinks of alcohol    Types: 2 Standard drinks  or equivalent per week    Comment: 1 glass wine/week (prior 1 glass martini with dinner)   Drug use: No    Family History  Problem Relation Age of Onset   Heart disease Mother    Heart disease Father    Kidney failure Father    Heart disease Sister    Heart attack Sister    Dementia Sister    Sjogren's syndrome Child    Hashimoto's thyroiditis Child    CAD Child     Allergies  Allergen Reactions   Ciprofloxacin Other (See Comments)    Hallucinations or jitteriness   Codeine Other (See Comments)    Hallucinations    Flexeril [Cyclobenzaprine] Other (See Comments)    "Made me feel goofy"   Imdur  [Isosorbide  Nitrate] Other (See Comments)    Caused headaches   Methocarbamol Other (See Comments)    "Made me feel goofy"   Other Other (See Comments)    CANNOT HAVE ANY FOODS WITH SEEDS    Strawberry Extract Other (See Comments)    CANNOT HAVE ANY FOODS WITH SEEDS   Sulfa  Antibiotics Other (See Comments)    Chills and shaking- "serum sickness"    Sulfamethoxazole Other (See Comments)    Chills and shaking- "serum sickness"   Sulfonamide Derivatives Other (See Comments)    Chills and shaking "serum sickness"   Tomato Other (See Comments)    CANNOT HAVE ANY FOODS WITH SEEDS   Flagyl [Metronidazole] Rash    Medication list has been reviewed and updated.  Current Outpatient Medications on File Prior to Visit  Medication Sig Dispense Refill   acetaminophen  (TYLENOL ) 500 MG tablet Take 500 mg by mouth every 6 (six) hours as needed for moderate pain (pain score 4-6).     blood glucose meter kit and supplies KIT Dispense based on patient and insurance preference. Use up to four times daily as directed. 1 each 0   dapagliflozin  propanediol (FARXIGA ) 5 MG TABS tablet Take 1 tablet (5 mg total) by mouth daily. 30 tablet 0   guaiFENesin -dextromethorphan  (ROBITUSSIN DM) 100-10 MG/5ML syrup Take 10 mLs by mouth every 4 (four) hours as needed for cough (chest congestion). 118 mL 0   NP THYROID  120 MG tablet TAKE ONE TABLET BY MOUTH ONE TIME DAILY BEFORE BREAKFAST 90 tablet 0   pantoprazole  (PROTONIX ) 40 MG tablet Take 1 tablet (40 mg total) by mouth daily. 90 tablet 0   No current facility-administered medications on file prior to visit.    Review of Systems:  As per HPI- otherwise negative.   Physical Examination: There were no vitals filed for this visit. There were no vitals filed for this visit. There is no height or weight on file to calculate BMI. Ideal Body Weight:    GEN: no acute distress. HEENT: Atraumatic, Normocephalic.  Ears and Nose: No external deformity. CV: RRR, No M/G/R. No JVD. No thrill. No extra heart sounds. PULM: CTA B, no wheezes, crackles, rhonchi. No retractions. No resp. distress. No accessory muscle use. ABD: S, NT, ND, +BS. No rebound. No HSM. EXTR: No c/c/e PSYCH: Normally interactive. Conversant.    Assessment and  Plan: ***  Signed Gates Kasal, MD

## 2023-11-01 ENCOUNTER — Ambulatory Visit: Admitting: Family Medicine

## 2023-11-01 ENCOUNTER — Encounter: Payer: Self-pay | Admitting: Family Medicine

## 2023-11-01 VITALS — BP 128/76 | HR 60 | Ht 66.0 in | Wt 193.0 lb

## 2023-11-01 DIAGNOSIS — D62 Acute posthemorrhagic anemia: Secondary | ICD-10-CM | POA: Diagnosis not present

## 2023-11-01 DIAGNOSIS — E039 Hypothyroidism, unspecified: Secondary | ICD-10-CM

## 2023-11-01 DIAGNOSIS — E119 Type 2 diabetes mellitus without complications: Secondary | ICD-10-CM

## 2023-11-01 DIAGNOSIS — Z7984 Long term (current) use of oral hypoglycemic drugs: Secondary | ICD-10-CM

## 2023-11-01 DIAGNOSIS — N1832 Chronic kidney disease, stage 3b: Secondary | ICD-10-CM

## 2023-11-01 MED ORDER — DAPAGLIFLOZIN PROPANEDIOL 5 MG PO TABS: 5.0000 mg | ORAL_TABLET | Freq: Every day | ORAL | 3 refills | Status: DC

## 2023-11-01 MED ORDER — THYROID 120 MG PO TABS
120.0000 mg | ORAL_TABLET | Freq: Every day | ORAL | 3 refills | Status: DC
Start: 2023-11-01 — End: 2024-01-01

## 2023-11-02 ENCOUNTER — Encounter: Payer: Self-pay | Admitting: Family Medicine

## 2023-11-02 LAB — COMPREHENSIVE METABOLIC PANEL WITH GFR
ALT: 25 U/L (ref 0–53)
AST: 33 U/L (ref 0–37)
Albumin: 3.9 g/dL (ref 3.5–5.2)
Alkaline Phosphatase: 94 U/L (ref 39–117)
BUN: 18 mg/dL (ref 6–23)
CO2: 30 meq/L (ref 19–32)
Calcium: 9.4 mg/dL (ref 8.4–10.5)
Chloride: 101 meq/L (ref 96–112)
Creatinine, Ser: 1.72 mg/dL — ABNORMAL HIGH (ref 0.40–1.50)
GFR: 35.12 mL/min — ABNORMAL LOW (ref >60.00)
Glucose, Bld: 119 mg/dL — ABNORMAL HIGH (ref 70–99)
Potassium: 4.7 meq/L (ref 3.5–5.1)
Sodium: 139 meq/L (ref 135–145)
Total Bilirubin: 1.6 mg/dL — ABNORMAL HIGH (ref 0.2–1.2)
Total Protein: 6.8 g/dL (ref 6.0–8.3)

## 2023-11-02 LAB — TSH: TSH: 6.95 u[IU]/mL — ABNORMAL HIGH (ref 0.35–5.50)

## 2023-11-02 LAB — CBC
HCT: 32.9 % — ABNORMAL LOW (ref 39.0–52.0)
Hemoglobin: 10.9 g/dL — ABNORMAL LOW (ref 13.0–17.0)
MCHC: 33.2 g/dL (ref 30.0–36.0)
MCV: 106.1 fl — ABNORMAL HIGH (ref 78.0–100.0)
Platelets: 101 10*3/uL — ABNORMAL LOW (ref 150.0–400.0)
RBC: 3.1 Mil/uL — ABNORMAL LOW (ref 4.22–5.81)
RDW: 16.5 % — ABNORMAL HIGH (ref 11.5–15.5)
WBC: 3.7 10*3/uL — ABNORMAL LOW (ref 4.0–10.5)

## 2023-11-11 ENCOUNTER — Ambulatory Visit (INDEPENDENT_AMBULATORY_CARE_PROVIDER_SITE_OTHER): Payer: Medicare Other

## 2023-11-11 DIAGNOSIS — I442 Atrioventricular block, complete: Secondary | ICD-10-CM

## 2023-11-11 LAB — CUP PACEART REMOTE DEVICE CHECK
Battery Remaining Longevity: 101 mo
Battery Remaining Percentage: 84 %
Battery Voltage: 2.99 V
Brady Statistic AP VP Percent: 1 %
Brady Statistic AP VS Percent: 1.2 %
Brady Statistic AS VP Percent: 46 %
Brady Statistic AS VS Percent: 52 %
Brady Statistic RA Percent Paced: 1.8 %
Brady Statistic RV Percent Paced: 47 %
Date Time Interrogation Session: 20250501040015
Implantable Lead Connection Status: 753985
Implantable Lead Connection Status: 753985
Implantable Lead Implant Date: 20240131
Implantable Lead Implant Date: 20240131
Implantable Lead Location: 753859
Implantable Lead Location: 753860
Implantable Pulse Generator Implant Date: 20240131
Lead Channel Impedance Value: 400 Ohm
Lead Channel Impedance Value: 430 Ohm
Lead Channel Pacing Threshold Amplitude: 0.5 V
Lead Channel Pacing Threshold Amplitude: 0.875 V
Lead Channel Pacing Threshold Pulse Width: 0.5 ms
Lead Channel Pacing Threshold Pulse Width: 0.5 ms
Lead Channel Sensing Intrinsic Amplitude: 10.4 mV
Lead Channel Sensing Intrinsic Amplitude: 4.6 mV
Lead Channel Setting Pacing Amplitude: 1.125
Lead Channel Setting Pacing Amplitude: 2 V
Lead Channel Setting Pacing Pulse Width: 0.5 ms
Lead Channel Setting Sensing Sensitivity: 4 mV
Pulse Gen Model: 2272
Pulse Gen Serial Number: 5680536

## 2023-12-23 ENCOUNTER — Encounter: Payer: Self-pay | Admitting: Physician Assistant

## 2023-12-23 ENCOUNTER — Ambulatory Visit: Admitting: Physician Assistant

## 2023-12-23 ENCOUNTER — Ambulatory Visit: Payer: Self-pay

## 2023-12-23 VITALS — BP 135/72 | HR 87 | Temp 98.1°F | Resp 18 | Ht 66.0 in | Wt 180.0 lb

## 2023-12-23 DIAGNOSIS — S32010A Wedge compression fracture of first lumbar vertebra, initial encounter for closed fracture: Secondary | ICD-10-CM | POA: Diagnosis not present

## 2023-12-23 DIAGNOSIS — Z5982 Transportation insecurity: Secondary | ICD-10-CM | POA: Diagnosis not present

## 2023-12-23 MED ORDER — HYDROCODONE-ACETAMINOPHEN 5-325 MG PO TABS: 0.5000 | ORAL_TABLET | Freq: Four times a day (QID) | ORAL | 0 refills | Status: DC | PRN

## 2023-12-23 NOTE — Telephone Encounter (Signed)
 FYI Only or Action Required?: FYI only for provider  Patient was last seen in primary care on 11/01/2023 by Copland, Skipper Dumas, MD. Called Nurse Triage reporting Back Pain. Symptoms began several weeks ago. Interventions attempted: OTC medications: See note. Symptoms are: unchanged.  Triage Disposition: See HCP Within 4 Hours (Or PCP Triage) Scheduled  Patient/caregiver understands and will follow disposition?: Yes      Copied from CRM [id number]. Topic: Clinical - Red Word Triage >> Dec 23, 2023 12:10 PM Luane Rumps D wrote: Red Word that prompted transfer to Nurse Triage: Pain: Fracture in vertebrae in January. Was in rehab 5 weeks after and was fine but the last 2-3 weeks the area where his fractured vertebrae was is extremely painful. Is it possible to give him some medication to help with the pain. Scale: past 10/10. Ibuprofen & Tylenol  not helpful. He can't move around without feeling the extreme pain. Reason for Disposition  [1] SEVERE back pain (e.g., excruciating, unable to do any normal activities) AND [2] not improved 2 hours after pain medicine  Answer Assessment - Initial Assessment Questions 1. ONSET: When did the pain begin?       2-3 months      2. LOCATION: Where does it hurt? (upper, mid or lower back)     ---- Back    3. SEVERITY: How bad is the pain?  (e.g., Scale 1-10; mild, moderate, or severe)   - MILD (1-3): Doesn't interfere with normal activities.    - MODERATE (4-7): Interferes with normal activities or awakens from sleep.    - SEVERE (8-10): Excruciating pain, unable to do any normal activities.       ------------- Severe 8/10     4. PATTERN: Is the pain constant? (e.g., yes, no; constant, intermittent)        ---Intermittent, only when exerting himself     5. RADIATION: Does the pain shoot into your legs or somewhere else?     -------Denies    6. CAUSE:  What do you think is causing the back pain?      -------------- Unsure    8.  MEDICINES: What have you taken so far for the pain? (e.g., nothing, acetaminophen , NSAIDS)     --Ibu and Tylenol - Minimal relief    9. NEUROLOGIC SYMPTOMS: Do you have any weakness, numbness, or problems with bowel/bladder control?       --------------Denies    10. OTHER SYMPTOMS: Do you have any other symptoms? (e.g., fever, abdomen pain, burning with urination, blood in urine)       ----------------Denies    Additional Infor: Surgery ( d/t fractured vertebrae from the fall ): Jan 2025  Protocols used: Back Pain-A-AH

## 2023-12-23 NOTE — Progress Notes (Signed)
 Remote pacemaker transmission.

## 2023-12-23 NOTE — Progress Notes (Signed)
 Established patient visit   Patient: [name]   DOB: 12/13/1933   89 y.o. Male  MRN: [id number] Visit Date: 12/23/2023  Today's healthcare provider: Trenton Frock, PA-C   Chief Complaint  Patient presents with   Back Pain    X2-3 week, lower, per wife fractured back about 8 months ago, denies new injury    Subjective     Discussed the use of AI scribe software for clinical note transcription with the patient, who gave verbal consent to proceed.  History of Present Illness   The patient  presents with worsening back pain. He is accompanied by his wife and a neighbor that drove them. History is provided by wife.  Severe, constant back pain has worsened over the past week, localized to the lower vertebrae. The pain is excruciating with movement, causing screaming. Current pain management with Tylenol  is ineffective. He was also given a 'left over hydrocodone ' and ibuprofen.  He has a history of L1 vertebra fracture and scoliosis, which may contribute to the pain. He completed five weeks of rehabilitation at [facility] last year(?) but the pain has returned without recent falls or injuries. Mobility is difficult, requiring a walker, and he stoops due to spinal pain. He sleeps in a lower bed to avoid lifting his legs, which exacerbates the pain. He has not engaged in recent physical therapy at home.  Urination pattern has changed per pt's wife, that usually he dribbles but this morning had a significant amount of urination. No dysuria is present.      Medications: Outpatient Medications Prior to Visit  Medication Sig   blood glucose meter kit and supplies KIT Dispense based on patient and insurance preference. Use up to four times daily as directed.   dapagliflozin  propanediol (FARXIGA ) 5 MG TABS tablet Take 1 tablet (5 mg total) by mouth daily.   guaiFENesin -dextromethorphan  (ROBITUSSIN DM) 100-10 MG/5ML syrup Take 10 mLs by mouth every 4 (four) hours as needed for  cough (chest congestion).   pantoprazole  (PROTONIX ) 40 MG tablet Take 1 tablet (40 mg total) by mouth daily.   thyroid  (NP THYROID ) 120 MG tablet Take 1 tablet (120 mg total) by mouth daily before breakfast.   acetaminophen  (TYLENOL ) 500 MG tablet Take 500 mg by mouth every 6 (six) hours as needed for moderate pain (pain score 4-6). (Patient not taking: Reported on 12/23/2023)   No facility-administered medications prior to visit.    Review of Systems  Constitutional:  Negative for fatigue and fever.  Respiratory:  Negative for cough and shortness of breath.   Cardiovascular:  Negative for chest pain, palpitations and leg swelling.  Musculoskeletal:  Positive for back pain and gait problem.  Neurological:  Negative for dizziness and headaches.       Objective    BP 135/72   Pulse 87   Temp 98.1 F (36.7 C) (Oral)   Resp 18   Ht 5' 6 (1.676 m)   Wt 180 lb (81.6 kg) Comment: per Pt  SpO2 92%   BMI 29.05 kg/m    Physical Exam Constitutional:      General: He is awake.     Appearance: He is well-developed.  HENT:     Head: Normocephalic.   Eyes:     Conjunctiva/sclera: Conjunctivae normal.    Cardiovascular:     Rate and Rhythm: Normal rate and regular rhythm.     Heart sounds: Normal heart sounds.  Pulmonary:     Effort:  Pulmonary effort is normal.     Breath sounds: Normal breath sounds.   Musculoskeletal:     Comments: Tenderness L1, midline   Skin:    General: Skin is warm.     Comments: No visible rashes at area of pain   Neurological:     Mental Status: He is alert. Mental status is at baseline.     Comments: Pt keeps nodding off during appointment when not directly spoken to.  Psychiatric:        Attention and Perception: Attention normal.        Mood and Affect: Mood normal.        Speech: Speech normal.        Behavior: Behavior is cooperative.     No results found for any visits on 12/23/23.  Assessment & Plan    Closed compression fracture  of body of L1 vertebra (HCC) History of L1 compression fx. Unsure why symptoms have increased recently.  Discussed plan of care w/ pts PCP Dr Geralyn Knee, ok with prn hydrocodone  for severe pain. Educated to avoid NSAIDs d/t recent GI bleed.  Repeat lumbar films.  Recommending resume home PT, pt and family hesitant. Discussed hospital bed at home to help with mobility, wife will consider.   F/b pcp 4 weeks  -     HYDROcodone -Acetaminophen ; Take 0.5-1 tablets by mouth every 6 (six) hours as needed for moderate pain (pain score 4-6).  Dispense: 30 tablet; Refill: 0 -     DG Lumbar Spine Complete; Future  Transportation insecurity -     AMB Referral [facility]BC[facility] Care Management  Return for and Medicare Wellness visit.       Trenton Frock, PA-C  [facility] at [facility] [phone] (phone) [phone] (fax)  [facility]

## 2023-12-24 ENCOUNTER — Encounter: Payer: Self-pay | Admitting: Physician Assistant

## 2023-12-27 ENCOUNTER — Other Ambulatory Visit: Payer: Self-pay | Admitting: Family Medicine

## 2023-12-27 ENCOUNTER — Telehealth: Payer: Self-pay | Admitting: *Deleted

## 2023-12-27 ENCOUNTER — Ambulatory Visit: Payer: Self-pay

## 2023-12-27 ENCOUNTER — Ambulatory Visit (HOSPITAL_BASED_OUTPATIENT_CLINIC_OR_DEPARTMENT_OTHER)
Admission: RE | Admit: 2023-12-27 | Discharge: 2023-12-27 | Disposition: A | Discharge status: Home / Self Care | Source: Ambulatory Visit | Attending: Physician Assistant | Admitting: Physician Assistant

## 2023-12-27 DIAGNOSIS — S32010A Wedge compression fracture of first lumbar vertebra, initial encounter for closed fracture: Secondary | ICD-10-CM | POA: Diagnosis present

## 2023-12-27 DIAGNOSIS — M545 Low back pain, unspecified: Secondary | ICD-10-CM

## 2023-12-27 MED ORDER — OXYCODONE-ACETAMINOPHEN 5-325 MG PO TABS: 1.0000 | ORAL_TABLET | Freq: Three times a day (TID) | ORAL | 0 refills | Status: DC | PRN

## 2023-12-27 MED ORDER — OXYCODONE-ACETAMINOPHEN 5-325 MG PO TABS
1.0000 | ORAL_TABLET | Freq: Three times a day (TID) | ORAL | 0 refills | Status: DC | PRN
Start: 2023-12-27 — End: 2023-12-27

## 2023-12-27 NOTE — Addendum Note (Signed)
 Addended by: Gates Kasal C on: 12/27/2023 12:26 PM   Modules accepted: Orders

## 2023-12-27 NOTE — Telephone Encounter (Signed)
 Reviewed recent note from my partner Trenton Frock- she saw pt with back pain on 6/12 and gave hydrocodone  for severe back pain 5mg  every 6 hours as needed  A lumbar spine series was ordered but not done as of yet  I cannot get into PDMP at the moment  Called back and spoke with wife Delores  He has used oxycodone  in the past- given 5mg  last year - did ok They were not aware Heidi Llamas ordered x-rays.   I sent in oxycodone , asked them to come in for lumbar films at earliest convenience

## 2023-12-27 NOTE — Telephone Encounter (Signed)
 Copied from CRM [id number]. Topic: Clinical - Red Word Triage >> Dec 27, 2023 10:06 AM Chuck Crater wrote: Red Word that prompted transfer to Nurse Triage: Patient wife wants to ask Dr. Geralyn Knee about Hydrocodone  medication. She states that he is still having really extreme lower back pain. She said that it is getting worse. He is fine while sitting but when he tries to get up it hurts to stand and walk. She wants to know if they can switch to a different medication.   FYI Only or Action Required?: Action required by provider  Patient was last seen in primary care on 11/01/2023 by Copland, Skipper Dumas, MD. Called Nurse Triage reporting Med Change Request. Symptoms began 2-3 months ago. Interventions attempted: Prescription medications: Norco. Symptoms are: unchanged.  Triage Disposition: See PCP When Office is Open (Within 3 Days)  Patient/caregiver understands and will follow disposition?: Yes (Patient seen 4 days ago for the same, request for medication change, please advise)   Reason for Disposition  Prescription request for new medicine (not a refill)  Answer Assessment - Initial Assessment Questions 1. NAME of MEDICINE: What medicine(s) are you calling about?     Hydrocodone -acetaminophen    2. QUESTION: What is your question? (e.g., double dose of medicine, side effect)     Can it be changed to a differed medication, does not seem to be effective   3. PRESCRIBER: Who prescribed the medicine? Reason: if prescribed by specialist, call should be referred to that group.     Dr. Geralyn Knee  4. SYMPTOMS: Do you have any symptoms? If Yes, ask: What symptoms are you having?  How bad are the symptoms (e.g., mild, moderate, severe)     Lower back pain, ongoing  Protocols used: Medication Question Call-A-AH

## 2023-12-27 NOTE — Progress Notes (Signed)
 Complex Care Management Note Care Guide Note  12/27/2023 Name: [name] MRN: [id number] DOB: [birth year]  [name] is a 89 y.o. year old male who is a primary care patient of Copland, Skipper Dumas, MD . The community resource team was consulted for assistance with Transportation Needs  and Caregiver resources   SDOH screenings and interventions completed:  Yes     SDOH Interventions Today    Flowsheet Row Most Recent Value  SDOH Interventions   Transportation Interventions [facility] ([employer] [facility] [employer])  [Will mail  [place] mobility transportation]  Stress Interventions Community Resources Provided  [Provided information to patient on Dementia caregiver resources]     Care guide performed the following interventions: Patient provided with information about care guide support team and interviewed to confirm resource needs.  Follow Up Plan:  No further follow up planned at this time. The patient has been provided with needed resources.  Encounter Outcome:  Patient Visit Completed  Shalik Sanfilippo Greenauer-Moran  [facility] HealthPopulation Health Care Guide  Direct [phone] [phone] Website: [email or web address]

## 2023-12-30 ENCOUNTER — Inpatient Hospital Stay (HOSPITAL_COMMUNITY)
Admission: EM | Admit: 2023-12-30 | Discharge: 2024-01-11 | DRG: 432 | Disposition: E | Attending: Internal Medicine | Admitting: Internal Medicine

## 2023-12-30 ENCOUNTER — Ambulatory Visit: Payer: Self-pay

## 2023-12-30 ENCOUNTER — Other Ambulatory Visit: Payer: Self-pay

## 2023-12-30 ENCOUNTER — Encounter (HOSPITAL_COMMUNITY): Payer: Self-pay

## 2023-12-30 DIAGNOSIS — R55 Syncope and collapse: Principal | ICD-10-CM

## 2023-12-30 DIAGNOSIS — Z882 Allergy status to sulfonamides status: Secondary | ICD-10-CM

## 2023-12-30 DIAGNOSIS — Z87891 Personal history of nicotine dependence: Secondary | ICD-10-CM

## 2023-12-30 DIAGNOSIS — E785 Hyperlipidemia, unspecified: Secondary | ICD-10-CM | POA: Diagnosis present

## 2023-12-30 DIAGNOSIS — Z8673 Personal history of transient ischemic attack (TIA), and cerebral infarction without residual deficits: Secondary | ICD-10-CM

## 2023-12-30 DIAGNOSIS — I8511 Secondary esophageal varices with bleeding: Secondary | ICD-10-CM | POA: Diagnosis present

## 2023-12-30 DIAGNOSIS — Z66 Do not resuscitate: Secondary | ICD-10-CM | POA: Diagnosis present

## 2023-12-30 DIAGNOSIS — D6959 Other secondary thrombocytopenia: Secondary | ICD-10-CM | POA: Diagnosis present

## 2023-12-30 DIAGNOSIS — R578 Other shock: Secondary | ICD-10-CM | POA: Diagnosis present

## 2023-12-30 DIAGNOSIS — Z8546 Personal history of malignant neoplasm of prostate: Secondary | ICD-10-CM

## 2023-12-30 DIAGNOSIS — K219 Gastro-esophageal reflux disease without esophagitis: Secondary | ICD-10-CM | POA: Diagnosis present

## 2023-12-30 DIAGNOSIS — Z955 Presence of coronary angioplasty implant and graft: Secondary | ICD-10-CM

## 2023-12-30 DIAGNOSIS — E669 Obesity, unspecified: Secondary | ICD-10-CM | POA: Diagnosis present

## 2023-12-30 DIAGNOSIS — I2510 Atherosclerotic heart disease of native coronary artery without angina pectoris: Secondary | ICD-10-CM | POA: Diagnosis present

## 2023-12-30 DIAGNOSIS — I129 Hypertensive chronic kidney disease with stage 1 through stage 4 chronic kidney disease, or unspecified chronic kidney disease: Secondary | ICD-10-CM | POA: Diagnosis present

## 2023-12-30 DIAGNOSIS — D62 Acute posthemorrhagic anemia: Secondary | ICD-10-CM | POA: Diagnosis present

## 2023-12-30 DIAGNOSIS — E8721 Acute metabolic acidosis: Secondary | ICD-10-CM | POA: Diagnosis present

## 2023-12-30 DIAGNOSIS — F0390 Unspecified dementia without behavioral disturbance: Secondary | ICD-10-CM | POA: Diagnosis present

## 2023-12-30 DIAGNOSIS — E079 Disorder of thyroid, unspecified: Secondary | ICD-10-CM | POA: Diagnosis present

## 2023-12-30 DIAGNOSIS — I739 Peripheral vascular disease, unspecified: Secondary | ICD-10-CM | POA: Diagnosis present

## 2023-12-30 DIAGNOSIS — Z6828 Body mass index (BMI) 28.0-28.9, adult: Secondary | ICD-10-CM

## 2023-12-30 DIAGNOSIS — N1832 Chronic kidney disease, stage 3b: Secondary | ICD-10-CM | POA: Diagnosis present

## 2023-12-30 DIAGNOSIS — Z881 Allergy status to other antibiotic agents status: Secondary | ICD-10-CM

## 2023-12-30 DIAGNOSIS — Z7982 Long term (current) use of aspirin: Secondary | ICD-10-CM

## 2023-12-30 DIAGNOSIS — D539 Nutritional anemia, unspecified: Secondary | ICD-10-CM | POA: Diagnosis present

## 2023-12-30 DIAGNOSIS — Z8249 Family history of ischemic heart disease and other diseases of the circulatory system: Secondary | ICD-10-CM

## 2023-12-30 DIAGNOSIS — Z79899 Other long term (current) drug therapy: Secondary | ICD-10-CM

## 2023-12-30 DIAGNOSIS — R579 Shock, unspecified: Secondary | ICD-10-CM

## 2023-12-30 DIAGNOSIS — J45909 Unspecified asthma, uncomplicated: Secondary | ICD-10-CM | POA: Diagnosis present

## 2023-12-30 DIAGNOSIS — K7460 Unspecified cirrhosis of liver: Principal | ICD-10-CM | POA: Diagnosis present

## 2023-12-30 DIAGNOSIS — Z79890 Hormone replacement therapy (postmenopausal): Secondary | ICD-10-CM

## 2023-12-30 DIAGNOSIS — Z9079 Acquired absence of other genital organ(s): Secondary | ICD-10-CM

## 2023-12-30 DIAGNOSIS — K766 Portal hypertension: Secondary | ICD-10-CM | POA: Diagnosis present

## 2023-12-30 DIAGNOSIS — K922 Gastrointestinal hemorrhage, unspecified: Secondary | ICD-10-CM | POA: Diagnosis present

## 2023-12-30 DIAGNOSIS — G9341 Metabolic encephalopathy: Secondary | ICD-10-CM | POA: Diagnosis present

## 2023-12-30 DIAGNOSIS — I480 Paroxysmal atrial fibrillation: Secondary | ICD-10-CM | POA: Diagnosis present

## 2023-12-30 DIAGNOSIS — I864 Gastric varices: Secondary | ICD-10-CM | POA: Diagnosis present

## 2023-12-30 DIAGNOSIS — Z515 Encounter for palliative care: Secondary | ICD-10-CM

## 2023-12-30 NOTE — Telephone Encounter (Signed)
 FYI Only or Action Required?: Action required by provider: clinical question for provider. What else can be done for pt.'s pain,   Patient was last seen in primary care on 11/01/2023 by Copland, Skipper Dumas, MD. Called Nurse Triage reporting Back Pain. Symptoms began several weeks ago. Interventions attempted: Prescription medications: oxycodone . Symptoms are: unchanged. Even getting in and out of bed are difficult.  Triage Disposition: See HCP Within 4 Hours (Or PCP Triage)  Patient/caregiver understands and will follow disposition?: No, wishes to speak with PCP         Copied from CRM [id number]. Topic: Clinical - Red Word Triage >> Dec 30, 2023  8:18 AM Alethia Huxley E wrote: [facility] that prompted transfer to Nurse Triage: Severe back pain. Patient's wife stated patient is getting progressively worse even after being prescribe oxycodone . Reason for Disposition  [1] SEVERE back pain (e.g., excruciating, unable to do any normal activities) AND [2] not improved 2 hours after pain medicine  Answer Assessment - Initial Assessment Questions 1. ONSET: When did the pain begin?      3 weeks 2. LOCATION: Where does it hurt? (upper, mid or lower back)     lower 3. SEVERITY: How bad is the pain?  (e.g., Scale 1-10; mild, moderate, or severe)   - MILD (1-3): Doesn't interfere with normal activities.    - MODERATE (4-7): Interferes with normal activities or awakens from sleep.    - SEVERE (8-10): Excruciating pain, unable to do any normal activities.      Severe 4. PATTERN: Is the pain constant? (e.g., yes, no; constant, intermittent)      Comes and goes 5. RADIATION: Does the pain shoot into your legs or somewhere else?     no 6. CAUSE:  What do you think is causing the back pain?      no 7. BACK OVERUSE:  Any recent lifting of heavy objects, strenuous work or exercise?     no 8. MEDICINES: What have you taken so far for the pain? (e.g., nothing, acetaminophen , NSAIDS)      oxycodone  9. NEUROLOGIC SYMPTOMS: Do you have any weakness, numbness, or problems with bowel/bladder control?     no 10. OTHER SYMPTOMS: Do you have any other symptoms? (e.g., fever, abdomen pain, burning with urination, blood in urine)       no 11. PREGNANCY: Is there any chance you are pregnant? When was your last menstrual period?       N/a  Protocols used: Back Pain-A-AH

## 2023-12-30 NOTE — ED Triage Notes (Signed)
 Patient BIB [facility] from home. Patient lives with wife and wife found patient in chair unconscious and agonal breathing. EMS bagged patient for 15 minutes then patient became more alert and breathing adequately on own. Patient has prescription for oxy however states he does not keep track of how many he takes. No narcan  given for patient to be responsive. Hx of chronic back pain. Hypotensive with EMS and received 700 mL of NS.

## 2023-12-31 ENCOUNTER — Emergency Department (HOSPITAL_COMMUNITY)

## 2023-12-31 DIAGNOSIS — E785 Hyperlipidemia, unspecified: Secondary | ICD-10-CM | POA: Diagnosis present

## 2023-12-31 DIAGNOSIS — Z87891 Personal history of nicotine dependence: Secondary | ICD-10-CM | POA: Diagnosis not present

## 2023-12-31 DIAGNOSIS — I8511 Secondary esophageal varices with bleeding: Secondary | ICD-10-CM | POA: Diagnosis present

## 2023-12-31 DIAGNOSIS — K922 Gastrointestinal hemorrhage, unspecified: Secondary | ICD-10-CM

## 2023-12-31 DIAGNOSIS — E8721 Acute metabolic acidosis: Secondary | ICD-10-CM | POA: Diagnosis present

## 2023-12-31 DIAGNOSIS — I739 Peripheral vascular disease, unspecified: Secondary | ICD-10-CM | POA: Diagnosis present

## 2023-12-31 DIAGNOSIS — R579 Shock, unspecified: Secondary | ICD-10-CM

## 2023-12-31 DIAGNOSIS — Z66 Do not resuscitate: Secondary | ICD-10-CM | POA: Diagnosis present

## 2023-12-31 DIAGNOSIS — I864 Gastric varices: Secondary | ICD-10-CM | POA: Diagnosis present

## 2023-12-31 DIAGNOSIS — D6959 Other secondary thrombocytopenia: Secondary | ICD-10-CM | POA: Diagnosis present

## 2023-12-31 DIAGNOSIS — R55 Syncope and collapse: Secondary | ICD-10-CM | POA: Diagnosis present

## 2023-12-31 DIAGNOSIS — D539 Nutritional anemia, unspecified: Secondary | ICD-10-CM | POA: Diagnosis present

## 2023-12-31 DIAGNOSIS — R578 Other shock: Secondary | ICD-10-CM | POA: Diagnosis present

## 2023-12-31 DIAGNOSIS — D62 Acute posthemorrhagic anemia: Secondary | ICD-10-CM | POA: Diagnosis present

## 2023-12-31 DIAGNOSIS — I480 Paroxysmal atrial fibrillation: Secondary | ICD-10-CM | POA: Diagnosis present

## 2023-12-31 DIAGNOSIS — N1832 Chronic kidney disease, stage 3b: Secondary | ICD-10-CM | POA: Diagnosis present

## 2023-12-31 DIAGNOSIS — I2510 Atherosclerotic heart disease of native coronary artery without angina pectoris: Secondary | ICD-10-CM | POA: Diagnosis present

## 2023-12-31 DIAGNOSIS — J45909 Unspecified asthma, uncomplicated: Secondary | ICD-10-CM | POA: Diagnosis present

## 2023-12-31 DIAGNOSIS — K7460 Unspecified cirrhosis of liver: Secondary | ICD-10-CM | POA: Diagnosis present

## 2023-12-31 DIAGNOSIS — K766 Portal hypertension: Secondary | ICD-10-CM | POA: Diagnosis present

## 2023-12-31 DIAGNOSIS — I129 Hypertensive chronic kidney disease with stage 1 through stage 4 chronic kidney disease, or unspecified chronic kidney disease: Secondary | ICD-10-CM | POA: Diagnosis present

## 2023-12-31 DIAGNOSIS — F0390 Unspecified dementia without behavioral disturbance: Secondary | ICD-10-CM | POA: Diagnosis present

## 2023-12-31 DIAGNOSIS — Z8249 Family history of ischemic heart disease and other diseases of the circulatory system: Secondary | ICD-10-CM | POA: Diagnosis not present

## 2023-12-31 DIAGNOSIS — E669 Obesity, unspecified: Secondary | ICD-10-CM | POA: Diagnosis present

## 2023-12-31 DIAGNOSIS — Z515 Encounter for palliative care: Secondary | ICD-10-CM | POA: Diagnosis not present

## 2023-12-31 DIAGNOSIS — G9341 Metabolic encephalopathy: Secondary | ICD-10-CM | POA: Diagnosis present

## 2023-12-31 LAB — COMPREHENSIVE METABOLIC PANEL WITH GFR
ALT: 14 U/L (ref 0–44)
AST: 24 U/L (ref 15–41)
Albumin: 2.6 g/dL — ABNORMAL LOW (ref 3.5–5.0)
Alkaline Phosphatase: 58 U/L (ref 38–126)
Anion gap: 10 (ref 5–15)
BUN: 24 mg/dL — ABNORMAL HIGH (ref 8–23)
CO2: 20 mmol/L — ABNORMAL LOW (ref 22–32)
Calcium: 8.3 mg/dL — ABNORMAL LOW (ref 8.9–10.3)
Chloride: 110 mmol/L (ref 98–111)
Creatinine, Ser: 1.92 mg/dL — ABNORMAL HIGH (ref 0.61–1.24)
GFR, Estimated: 33 mL/min — ABNORMAL LOW (ref >60)
Glucose, Bld: 152 mg/dL — ABNORMAL HIGH (ref 70–99)
Potassium: 3.6 mmol/L (ref 3.5–5.1)
Sodium: 140 mmol/L (ref 135–145)
Total Bilirubin: 1.5 mg/dL — ABNORMAL HIGH (ref 0.0–1.2)
Total Protein: 5.3 g/dL — ABNORMAL LOW (ref 6.5–8.1)

## 2023-12-31 LAB — GLUCOSE, CAPILLARY
Glucose-Capillary: 132 mg/dL — ABNORMAL HIGH (ref 70–99)
Glucose-Capillary: 189 mg/dL — ABNORMAL HIGH (ref 70–99)

## 2023-12-31 LAB — BASIC METABOLIC PANEL WITH GFR
Anion gap: 23 — ABNORMAL HIGH (ref 5–15)
BUN: 26 mg/dL — ABNORMAL HIGH (ref 8–23)
CO2: 9 mmol/L — ABNORMAL LOW (ref 22–32)
Calcium: 7.3 mg/dL — ABNORMAL LOW (ref 8.9–10.3)
Chloride: 109 mmol/L (ref 98–111)
Creatinine, Ser: 1.97 mg/dL — ABNORMAL HIGH (ref 0.61–1.24)
GFR, Estimated: 32 mL/min — ABNORMAL LOW (ref >60)
Glucose, Bld: 240 mg/dL — ABNORMAL HIGH (ref 70–99)
Potassium: 5.8 mmol/L — ABNORMAL HIGH (ref 3.5–5.1)
Sodium: 141 mmol/L (ref 135–145)

## 2023-12-31 LAB — CBC WITH DIFFERENTIAL/PLATELET
Abs Immature Granulocytes: 0.43 10*3/uL — ABNORMAL HIGH (ref 0.00–0.07)
Basophils Absolute: 0.1 10*3/uL (ref 0.0–0.1)
Basophils Relative: 1 %
Eosinophils Absolute: 1 10*3/uL — ABNORMAL HIGH (ref 0.0–0.5)
Eosinophils Relative: 11 %
HCT: 27.6 % — ABNORMAL LOW (ref 39.0–52.0)
Hemoglobin: 8.9 g/dL — ABNORMAL LOW (ref 13.0–17.0)
Immature Granulocytes: 5 %
Lymphocytes Relative: 12 %
Lymphs Abs: 1.1 10*3/uL (ref 0.7–4.0)
MCH: 33.1 pg (ref 26.0–34.0)
MCHC: 32.2 g/dL (ref 30.0–36.0)
MCV: 102.6 fL — ABNORMAL HIGH (ref 80.0–100.0)
Monocytes Absolute: 1.3 10*3/uL — ABNORMAL HIGH (ref 0.1–1.0)
Monocytes Relative: 14 %
Neutro Abs: 5.4 10*3/uL (ref 1.7–7.7)
Neutrophils Relative %: 57 %
Platelets: 133 10*3/uL — ABNORMAL LOW (ref 150–400)
RBC: 2.69 MIL/uL — ABNORMAL LOW (ref 4.22–5.81)
RDW: 16.9 % — ABNORMAL HIGH (ref 11.5–15.5)
WBC: 9.4 10*3/uL (ref 4.0–10.5)
nRBC: 0 % (ref 0.0–0.2)

## 2023-12-31 LAB — PREPARE PLATELET PHERESIS: Unit division: 0

## 2023-12-31 LAB — I-STAT CHEM 8, ED
BUN: 25 mg/dL — ABNORMAL HIGH (ref 8–23)
Calcium, Ion: 1.2 mmol/L (ref 1.15–1.40)
Chloride: 105 mmol/L (ref 98–111)
Creatinine, Ser: 1.9 mg/dL — ABNORMAL HIGH (ref 0.61–1.24)
Glucose, Bld: 146 mg/dL — ABNORMAL HIGH (ref 70–99)
HCT: 27 % — ABNORMAL LOW (ref 39.0–52.0)
Hemoglobin: 9.2 g/dL — ABNORMAL LOW (ref 13.0–17.0)
Potassium: 3.6 mmol/L (ref 3.5–5.1)
Sodium: 142 mmol/L (ref 135–145)
TCO2: 21 mmol/L — ABNORMAL LOW (ref 22–32)

## 2023-12-31 LAB — POCT I-STAT 7, (LYTES, BLD GAS, ICA,H+H)
Acid-base deficit: 20 mmol/L — ABNORMAL HIGH (ref 0.0–2.0)
Bicarbonate: 6.4 mmol/L — ABNORMAL LOW (ref 20.0–28.0)
Calcium, Ion: 1.05 mmol/L — ABNORMAL LOW (ref 1.15–1.40)
HCT: 24 % — ABNORMAL LOW (ref 39.0–52.0)
Hemoglobin: 8.2 g/dL — ABNORMAL LOW (ref 13.0–17.0)
O2 Saturation: 95 %
Patient temperature: 37
Potassium: 5.8 mmol/L — ABNORMAL HIGH (ref 3.5–5.1)
Sodium: 138 mmol/L (ref 135–145)
TCO2: 7 mmol/L — ABNORMAL LOW (ref 22–32)
pCO2 arterial: 18.1 mmHg — CL (ref 32–48)
pH, Arterial: 7.157 — CL (ref 7.35–7.45)
pO2, Arterial: 91 mmHg (ref 83–108)

## 2023-12-31 LAB — PREPARE RBC (CROSSMATCH)

## 2023-12-31 LAB — ETHANOL: Alcohol, Ethyl (B): <15 mg/dL (ref <15)

## 2023-12-31 LAB — I-STAT VENOUS BLOOD GAS, ED
Acid-base deficit: 4 mmol/L — ABNORMAL HIGH (ref 0.0–2.0)
Bicarbonate: 22.6 mmol/L (ref 20.0–28.0)
Calcium, Ion: 1.21 mmol/L (ref 1.15–1.40)
HCT: 25 % — ABNORMAL LOW (ref 39.0–52.0)
Hemoglobin: 8.5 g/dL — ABNORMAL LOW (ref 13.0–17.0)
O2 Saturation: 89 %
Potassium: 3.6 mmol/L (ref 3.5–5.1)
Sodium: 143 mmol/L (ref 135–145)
TCO2: 24 mmol/L (ref 22–32)
pCO2, Ven: 49.8 mmHg (ref 44–60)
pH, Ven: 7.266 (ref 7.25–7.43)
pO2, Ven: 65 mmHg — ABNORMAL HIGH (ref 32–45)

## 2023-12-31 LAB — CBC
HCT: 25.9 % — ABNORMAL LOW (ref 39.0–52.0)
HCT: 27.3 % — ABNORMAL LOW (ref 39.0–52.0)
Hemoglobin: 8.3 g/dL — ABNORMAL LOW (ref 13.0–17.0)
Hemoglobin: 8.8 g/dL — ABNORMAL LOW (ref 13.0–17.0)
MCH: 31.7 pg (ref 26.0–34.0)
MCH: 31.5 pg (ref 26.0–34.0)
MCHC: 32 g/dL (ref 30.0–36.0)
MCHC: 32.2 g/dL (ref 30.0–36.0)
MCV: 98.9 fL (ref 80.0–100.0)
MCV: 97.8 fL (ref 80.0–100.0)
Platelets: 113 10*3/uL — ABNORMAL LOW (ref 150–400)
Platelets: 73 10*3/uL — ABNORMAL LOW (ref 150–400)
RBC: 2.62 MIL/uL — ABNORMAL LOW (ref 4.22–5.81)
RBC: 2.79 MIL/uL — ABNORMAL LOW (ref 4.22–5.81)
RDW: 19.5 % — ABNORMAL HIGH (ref 11.5–15.5)
RDW: 16.7 % — ABNORMAL HIGH (ref 11.5–15.5)
WBC: 14.3 10*3/uL — ABNORMAL HIGH (ref 4.0–10.5)
WBC: 23.2 10*3/uL — ABNORMAL HIGH (ref 4.0–10.5)
nRBC: 0.6 % — ABNORMAL HIGH (ref 0.0–0.2)
nRBC: 0.5 % — ABNORMAL HIGH (ref 0.0–0.2)

## 2023-12-31 LAB — SALICYLATE LEVEL: Salicylate Lvl: <7 mg/dL — ABNORMAL LOW (ref 7.0–30.0)

## 2023-12-31 LAB — RESP PANEL BY RT-PCR (RSV, FLU A&B, COVID)  RVPGX2
Influenza A by PCR: NEGATIVE
Influenza B by PCR: NEGATIVE
Resp Syncytial Virus by PCR: NEGATIVE
SARS Coronavirus 2 by RT PCR: NEGATIVE

## 2023-12-31 LAB — PROTIME-INR
INR: 2.5 — ABNORMAL HIGH (ref 0.8–1.2)
Prothrombin Time: 27.5 s — ABNORMAL HIGH (ref 11.4–15.2)

## 2023-12-31 LAB — I-STAT CG4 LACTIC ACID, ED: Lactic Acid, Venous: 3.4 mmol/L (ref 0.5–1.9)

## 2023-12-31 LAB — FIBRINOGEN: Fibrinogen: 127 mg/dL — ABNORMAL LOW (ref 210–475)

## 2023-12-31 LAB — BPAM PLATELET PHERESIS
Blood Product Expiration Date: 202506202359
Unit Type and Rh: 5100

## 2023-12-31 LAB — MRSA NEXT GEN BY PCR, NASAL: MRSA by PCR Next Gen: NOT DETECTED

## 2023-12-31 LAB — TROPONIN I (HIGH SENSITIVITY)
Troponin I (High Sensitivity): 23 ng/L — ABNORMAL HIGH (ref <18)
Troponin I (High Sensitivity): 257 ng/L (ref <18)

## 2023-12-31 LAB — AMMONIA: Ammonia: 24 umol/L (ref 9–35)

## 2023-12-31 LAB — APTT: aPTT: 29 s (ref 24–36)

## 2023-12-31 LAB — TSH: TSH: 5.018 u[IU]/mL — ABNORMAL HIGH (ref 0.350–4.500)

## 2023-12-31 LAB — LIPASE, BLOOD: Lipase: 22 U/L (ref 11–51)

## 2023-12-31 LAB — POC OCCULT BLOOD, ED: Fecal Occult Bld: NEGATIVE

## 2023-12-31 LAB — LACTIC ACID, PLASMA: Lactic Acid, Venous: 7.1 mmol/L (ref 0.5–1.9)

## 2023-12-31 LAB — ACETAMINOPHEN LEVEL: Acetaminophen (Tylenol), Serum: 10 ug/mL (ref 10–30)

## 2023-12-31 SURGERY — EGD (ESOPHAGOGASTRODUODENOSCOPY)
Anesthesia: Moderate Sedation | Laterality: Left

## 2023-12-31 MED ORDER — MORPHINE SULFATE (PF) 2 MG/ML IV SOLN: 2.0000 mg | INTRAVENOUS | Status: DC | PRN

## 2023-12-31 MED ORDER — MORPHINE SULFATE (PF) 2 MG/ML IV SOLN
4.0000 mg | Freq: Once | INTRAVENOUS | Status: AC
  Administered 2023-12-31: 4 mg via INTRAVENOUS
  Filled 2023-12-31: qty 2

## 2023-12-31 MED ORDER — ACETAMINOPHEN 650 MG RE SUPP: 650.0000 mg | Freq: Four times a day (QID) | RECTAL | Status: DC | PRN

## 2023-12-31 MED ORDER — NOREPINEPHRINE 4 MG/250ML-% IV SOLN
0.0000 ug/min | INTRAVENOUS | Status: DC
  Administered 2023-12-31: 2 ug/min via INTRAVENOUS
  Filled 2023-12-31: qty 250

## 2023-12-31 MED ORDER — SODIUM BICARBONATE 8.4 % IV SOLN
INTRAVENOUS | Status: AC
  Administered 2023-12-31: 50 meq
  Filled 2023-12-31: qty 50

## 2023-12-31 MED ORDER — SODIUM CHLORIDE 0.9 % IV SOLN: INTRAVENOUS | Status: DC | PRN

## 2023-12-31 MED ORDER — PANTOPRAZOLE SODIUM 40 MG IV SOLR: 40.0000 mg | Freq: Two times a day (BID) | INTRAVENOUS | Status: DC

## 2023-12-31 MED ORDER — CALCIUM GLUCONATE-NACL 2-0.675 GM/100ML-% IV SOLN: 2.0000 g | Freq: Once | INTRAVENOUS | Status: DC

## 2023-12-31 MED ORDER — SODIUM CHLORIDE 0.9 % IV SOLN: 250.0000 mL | INTRAVENOUS | Status: DC

## 2023-12-31 MED ORDER — MIDAZOLAM HCL 2 MG/2ML IJ SOLN: 2.0000 mg | INTRAMUSCULAR | Status: DC | PRN

## 2023-12-31 MED ORDER — POLYVINYL ALCOHOL 1.4 % OP SOLN: 1.0000 [drp] | Freq: Four times a day (QID) | OPHTHALMIC | Status: DC | PRN

## 2023-12-31 MED ORDER — SODIUM CHLORIDE 0.9% FLUSH: 3.0000 mL | INTRAVENOUS | Status: DC | PRN

## 2023-12-31 MED ORDER — GLYCOPYRROLATE 0.2 MG/ML IJ SOLN
0.2000 mg | INTRAMUSCULAR | Status: DC | PRN
Start: 2023-12-31 — End: 2023-12-31

## 2023-12-31 MED ORDER — LACTATED RINGERS IV SOLN: INTRAVENOUS | Status: DC

## 2023-12-31 MED ORDER — PANTOPRAZOLE SODIUM 40 MG IV SOLR
40.0000 mg | Freq: Once | INTRAVENOUS | Status: AC
  Administered 2023-12-31: 40 mg via INTRAVENOUS
  Filled 2023-12-31: qty 10

## 2023-12-31 MED ORDER — SODIUM BICARBONATE 8.4 % IV SOLN: 50.0000 meq | Freq: Once | INTRAVENOUS | Status: AC

## 2023-12-31 MED ORDER — SODIUM CHLORIDE 0.9% IV SOLUTION: Freq: Once | INTRAVENOUS | Status: DC

## 2023-12-31 MED ORDER — SODIUM CHLORIDE 0.9 % IV SOLN: 2.0000 g | INTRAVENOUS | Status: DC

## 2023-12-31 MED ORDER — MIDAZOLAM HCL 2 MG/2ML IJ SOLN
2.0000 mg | Freq: Once | INTRAMUSCULAR | Status: AC
  Administered 2023-12-31: 2 mg via INTRAVENOUS
  Filled 2023-12-31: qty 2

## 2023-12-31 MED ORDER — NOREPINEPHRINE 16 MG/250ML-% IV SOLN: 0.0000 ug/min | INTRAVENOUS | Status: DC

## 2023-12-31 MED ORDER — SODIUM BICARBONATE 8.4 % IV SOLN
INTRAVENOUS | Status: AC
  Filled 2023-12-31: qty 50

## 2023-12-31 MED ORDER — MAGNESIUM SULFATE 2 GM/50ML IV SOLN
2.0000 g | Freq: Once | INTRAVENOUS | Status: AC
  Administered 2023-12-31: 2 g via INTRAVENOUS
  Filled 2023-12-31: qty 50

## 2023-12-31 MED ORDER — EPINEPHRINE HCL 5 MG/250ML IV SOLN IN NS
0.5000 ug/min | INTRAVENOUS | Status: DC
  Administered 2023-12-31: 0.5 ug/min via INTRAVENOUS
  Filled 2023-12-31: qty 250

## 2023-12-31 MED ORDER — METOCLOPRAMIDE HCL 5 MG/ML IJ SOLN
INTRAMUSCULAR | Status: AC
  Administered 2023-12-31: 10 mg
  Filled 2023-12-31: qty 2

## 2023-12-31 MED ORDER — VANCOMYCIN HCL IN DEXTROSE 1-5 GM/200ML-% IV SOLN: 1000.0000 mg | Freq: Once | INTRAVENOUS | Status: DC

## 2023-12-31 MED ORDER — NOREPINEPHRINE 16 MG/250ML-% IV SOLN
0.0000 ug/min | INTRAVENOUS | Status: DC
  Filled 2023-12-31: qty 250

## 2023-12-31 MED ORDER — SODIUM CHLORIDE 0.9 % IV SOLN: INTRAVENOUS | Status: DC

## 2023-12-31 MED ORDER — SODIUM CHLORIDE 0.9 % IV SOLN
0.5000 ug/min | INTRAVENOUS | Status: DC
  Filled 2023-12-31: qty 10

## 2023-12-31 MED ORDER — VASOPRESSIN 20 UNITS/100 ML INFUSION FOR SHOCK
0.0400 [IU]/min | INTRAVENOUS | Status: DC
  Administered 2023-12-31: 0.04 [IU]/min via INTRAVENOUS
  Filled 2023-12-31: qty 100

## 2023-12-31 MED ORDER — METOCLOPRAMIDE HCL 5 MG/ML IJ SOLN
5.0000 mg | Freq: Once | INTRAMUSCULAR | Status: AC
  Filled 2023-12-31: qty 2

## 2023-12-31 MED ORDER — SODIUM BICARBONATE 8.4 % IV SOLN
100.0000 meq | Freq: Once | INTRAVENOUS | Status: AC
  Administered 2023-12-31: 100 meq via INTRAVENOUS
  Filled 2023-12-31: qty 100

## 2023-12-31 MED ORDER — SODIUM CHLORIDE 0.9 % IV SOLN
50.0000 ug/h | INTRAVENOUS | Status: DC
  Administered 2023-12-31: 50 ug/h via INTRAVENOUS
  Filled 2023-12-31: qty 1

## 2023-12-31 MED ORDER — CHLORHEXIDINE GLUCONATE CLOTH 2 % EX PADS
6.0000 | MEDICATED_PAD | Freq: Every day | CUTANEOUS | Status: DC
  Administered 2023-12-31: 6 via TOPICAL

## 2023-12-31 MED ORDER — SODIUM CHLORIDE 0.9% FLUSH: 3.0000 mL | Freq: Two times a day (BID) | INTRAVENOUS | Status: DC

## 2023-12-31 MED ORDER — GLYCOPYRROLATE 1 MG PO TABS: 1.0000 mg | ORAL_TABLET | ORAL | Status: DC | PRN

## 2023-12-31 MED ORDER — NOREPINEPHRINE 4 MG/250ML-% IV SOLN
0.0000 ug/min | INTRAVENOUS | Status: DC
  Filled 2023-12-31: qty 250

## 2023-12-31 MED ORDER — CALCIUM GLUCONATE-NACL 2-0.675 GM/100ML-% IV SOLN
2.0000 g | Freq: Once | INTRAVENOUS | Status: AC
  Administered 2023-12-31: 2000 mg via INTRAVENOUS
  Filled 2023-12-31: qty 100

## 2023-12-31 MED ORDER — SODIUM BICARBONATE 8.4 % IV SOLN
50.0000 meq | Freq: Once | INTRAVENOUS | Status: AC
  Administered 2023-12-31: 50 meq via INTRAVENOUS

## 2023-12-31 MED ORDER — SODIUM CHLORIDE 0.9% IV SOLUTION: Freq: Once | INTRAVENOUS | Status: AC

## 2023-12-31 MED ORDER — VANCOMYCIN HCL 2000 MG/400ML IV SOLN
2000.0000 mg | Freq: Once | INTRAVENOUS | Status: AC
  Administered 2023-12-31: 2000 mg via INTRAVENOUS
  Filled 2023-12-31: qty 400

## 2023-12-31 MED ORDER — FUROSEMIDE 10 MG/ML IJ SOLN: 60.0000 mg | Freq: Once | INTRAMUSCULAR | Status: DC

## 2023-12-31 MED ORDER — ONDANSETRON HCL 4 MG/2ML IJ SOLN
4.0000 mg | Freq: Once | INTRAMUSCULAR | Status: AC
  Administered 2023-12-31: 4 mg via INTRAVENOUS
  Filled 2023-12-31: qty 2

## 2023-12-31 MED ORDER — SODIUM CHLORIDE 0.9 % IV BOLUS
1000.0000 mL | Freq: Once | INTRAVENOUS | Status: AC
  Administered 2023-12-31: 1000 mL via INTRAVENOUS

## 2023-12-31 MED ORDER — POTASSIUM CHLORIDE 10 MEQ/100ML IV SOLN
10.0000 meq | INTRAVENOUS | Status: AC
  Administered 2023-12-31 (×4): 10 meq via INTRAVENOUS
  Filled 2023-12-31 (×4): qty 100

## 2023-12-31 MED ORDER — CALCIUM GLUCONATE-NACL 2-0.675 GM/100ML-% IV SOLN
2.0000 g | Freq: Once | INTRAVENOUS | Status: DC
  Filled 2023-12-31: qty 100

## 2023-12-31 MED ORDER — LACTATED RINGERS IV BOLUS (SEPSIS)
1000.0000 mL | Freq: Once | INTRAVENOUS | Status: AC
  Administered 2023-12-31: 1000 mL via INTRAVENOUS

## 2023-12-31 MED ORDER — HYDROCORTISONE SOD SUC (PF) 100 MG IJ SOLR: 100.0000 mg | Freq: Two times a day (BID) | INTRAMUSCULAR | Status: DC

## 2023-12-31 MED ORDER — ACETAMINOPHEN 325 MG PO TABS: 650.0000 mg | ORAL_TABLET | Freq: Four times a day (QID) | ORAL | Status: DC | PRN

## 2023-12-31 MED ORDER — POLYETHYLENE GLYCOL 3350 17 G PO PACK: 17.0000 g | PACK | Freq: Every day | ORAL | Status: DC | PRN

## 2023-12-31 MED ORDER — LACTATED RINGERS IV BOLUS
1000.0000 mL | Freq: Once | INTRAVENOUS | Status: AC
  Administered 2023-12-31: 1000 mL via INTRAVENOUS

## 2023-12-31 MED ORDER — DOCUSATE SODIUM 100 MG PO CAPS: 100.0000 mg | ORAL_CAPSULE | Freq: Two times a day (BID) | ORAL | Status: DC | PRN

## 2023-12-31 MED ORDER — SODIUM CHLORIDE 0.9 % IV SOLN
2.0000 g | Freq: Once | INTRAVENOUS | Status: AC
  Administered 2023-12-31: 2 g via INTRAVENOUS
  Filled 2023-12-31: qty 12.5

## 2023-12-31 MED ORDER — OCTREOTIDE LOAD VIA INFUSION
50.0000 ug | Freq: Once | INTRAVENOUS | Status: AC
  Administered 2023-12-31: 50 ug via INTRAVENOUS
  Filled 2023-12-31: qty 25

## 2024-01-01 LAB — TYPE AND SCREEN
ABO/RH(D): O POS
Antibody Screen: NEGATIVE
Unit division: 0
Unit division: 0
Unit division: 0
Unit division: 0
Unit division: 0
Unit division: 0

## 2024-01-01 LAB — BPAM RBC
Blood Product Expiration Date: 202507102359
Blood Product Expiration Date: 202507102359
Blood Product Expiration Date: 202507122359
Blood Product Expiration Date: 202507142359
Blood Product Expiration Date: 202507142359
Blood Product Expiration Date: 202507142359
ISSUE DATE / TIME: 202506200045
ISSUE DATE / TIME: 202506200214
ISSUE DATE / TIME: 202506200448
ISSUE DATE / TIME: 202506200702
ISSUE DATE / TIME: 202506200813
ISSUE DATE / TIME: 202506201450
Unit Type and Rh: 5100
Unit Type and Rh: 5100
Unit Type and Rh: 5100
Unit Type and Rh: 5100
Unit Type and Rh: 5100
Unit Type and Rh: 5100

## 2024-01-01 LAB — BPAM FFP
Blood Product Expiration Date: 202506202359
ISSUE DATE / TIME: 202506200812
Unit Type and Rh: 6200

## 2024-01-01 LAB — PREPARE FRESH FROZEN PLASMA: Unit division: 0

## 2024-01-03 ENCOUNTER — Telehealth: Payer: Self-pay | Admitting: Family Medicine

## 2024-01-03 NOTE — Telephone Encounter (Signed)
 Received notice of [name]'s death and called his wife [name] and offered condolences, she has family with her and is doing ok  I will take care of death cert- body is at [employer]

## 2024-01-05 ENCOUNTER — Ambulatory Visit: Payer: Self-pay | Admitting: Physician Assistant

## 2024-01-05 LAB — CULTURE, BLOOD (ROUTINE X 2)
Culture: NO GROWTH
Special Requests: ADEQUATE

## 2024-01-11 NOTE — Progress Notes (Signed)
 This RT attempted to put in an Arterial Line X 2, unsuccessful. Got flash but unable to thread it. Dannie Duval, PA at bedside, going to place femoral A-Line.

## 2024-01-11 NOTE — IPAL (Signed)
 Interdisciplinary Goals of Care Family Meeting   Date carried out:: 12/27/2023  Location of the meeting: Bedside  Member's involved: Physician, Bedside Registered Nurse, Pharmacist, and Family Member or next of kin  Durable Power of Attorney or acting medical decision maker: Jettie Morse  Discussion: We discussed goals of care for [name] .    The Clinical status was relayed to patient's wife at bedside and patient's son over the phone in detail.   Updated and notified of patients medical condition.     Patient remains unresponsive and will not open eyes to command.   Patient is having a weak cough and struggling to remove secretions.   Patient with increased WOB and using accessory muscles to breathe For him to survive, he needs to be intubated at this time then he will require EGD to figure out where he is bleeding from, then we will try to come off of ventilator Patient's family decided he would not wanted to go on ventilator and would like to proceed with comfort care and palliative care   Patient with Progressive multiorgan failure with a very high probablity of a very minimal chance of meaningful recovery despite all aggressive and optimal medical therapy.  Code status: Full DNR  Disposition: In-patient comfort care    Family are satisfied with Plan of action and management. All questions answered   Trevor Fudge MD [facility] See Amion for pager If no response to pager, please call [phone] until 7pm After 7pm, Please call E-link [phone]

## 2024-01-11 NOTE — Consult Note (Signed)
 [facility] Consultation Note  Referring Provider: PCCM Primary Care Physician:  Kaylee Partridge, MD Primary Gastroenterologist:  [facility]  Reason for Consultation:  hematemesis, hypotension  HPI: [name] is a 89 y.o. male admitted hypotension, hematemesis.  Started yesterday evening voluminous hematemesis, last episode a few hours ago.  He is disoriented and not answering questions.  Prior similar, but less severe, episode a couple months ago for which he had endoscopy with bands placed.  Follow-up appt with us  in May scheduled, but appt canceled and patient no follow-up until now.   Past Medical History:  Diagnosis Date   Anginal pain (HCC)    Anxiety     OCCASIONAL   Arthritis    Asthma due to seasonal allergies    uses inhalers prn   Carotid artery occlusion    Left   Complication of anesthesia     had tremors after prostate surgery   Coronary artery disease    Diverticulitis    recurrent   Family history of anesthesia complication    MOTHER    GERD (gastroesophageal reflux disease)    H/O hiatal hernia    Hearing aid worn    Hyperlipemia    Hypertension    Kidney stone    lithotripsy 2011 w complications, req stents, Dr Nolon Baxter   Prostate CA [facility])    Shortness of breath    Thyroid  disease    TIA (transient ischemic attack)    Tinnitus     Past Surgical History:  Procedure Laterality Date   CARDIAC CATHETERIZATION  2008   minimal dz, Dr Floria Hurst   CARDIAC CATHETERIZATION N/A 10/09/2015   Procedure: Left Heart Cath and Coronary Angiography;  Surgeon: Peter M Swaziland, MD;  Location: [facility] INVASIVE CV LAB;  Service: Cardiovascular;  Laterality: N/A;   CARDIAC CATHETERIZATION N/A 10/09/2015   Procedure: Intravascular Pressure Wire/FFR Study;  Surgeon: Peter M Swaziland, MD;  Location: [facility] INVASIVE CV LAB;  Service: Cardiovascular;  Laterality: N/A;   CARDIAC CATHETERIZATION N/A 10/09/2015   Procedure: Coronary Stent Intervention;  Surgeon: Peter M Swaziland, MD;   Location: [facility] INVASIVE CV LAB;  Service: Cardiovascular;  Laterality: N/A;   CAROTID ENDARTERECTOMY  Left   1998   CHOLECYSTECTOMY     CORONARY STENT PLACEMENT  10/09/2015   DES to RCA   ESOPHAGEAL BANDING  10/16/2023   Procedure: ESOPHAGOSCOPY, WITH ESOPHAGEAL VARICES BAND LIGATION;  Surgeon: Evangeline Hilts, MD;  Location: [facility] ENDOSCOPY;  Service: Gastroenterology;;   ESOPHAGOGASTRODUODENOSCOPY Left 10/16/2023   Procedure: EGD (ESOPHAGOGASTRODUODENOSCOPY);  Surgeon: Evangeline Hilts, MD;  Location: [facility] ENDOSCOPY;  Service: Gastroenterology;  Laterality: Left;   ESOPHAGOGASTRODUODENOSCOPY (EGD) WITH PROPOFOL  Left 03/26/2017   Procedure: ESOPHAGOGASTRODUODENOSCOPY (EGD) WITH PROPOFOL ;  Surgeon: Evangeline Hilts, MD;  Location: [facility] ENDOSCOPY;  Service: Endoscopy;  Laterality: Left;   LEFT HEART CATHETERIZATION WITH CORONARY ANGIOGRAM N/A 09/15/2013   Procedure: LEFT HEART CATHETERIZATION WITH CORONARY ANGIOGRAM;  Surgeon: Peter M Swaziland, MD;  Location: [facility] CATH LAB;  Service: Cardiovascular;  Laterality: N/A;   PACEMAKER IMPLANT N/A 08/12/2022   Procedure: PACEMAKER IMPLANT;  Surgeon: Lei Pump, MD;  Location: [facility] INVASIVE CV LAB;  Service: Cardiovascular;  Laterality: N/A;   PROSTATECTOMY  1998   radical for prostate cancer   TEMPORARY PACEMAKER N/A 08/11/2022   Procedure: TEMPORARY PACEMAKER;  Surgeon: Knox Perl, MD;  Location: [facility] INVASIVE CV LAB;  Service: Cardiovascular;  Laterality: N/A;    Prior to Admission medications   Medication Sig Start Date End Date Taking? Authorizing  Provider  aspirin  EC 81 MG tablet Take 81 mg by mouth daily. Swallow whole.   Yes [provider]  dapagliflozin  propanediol (FARXIGA ) 5 MG TABS tablet Take 1 tablet (5 mg total) by mouth daily. 11/01/23  Yes Copland, Skipper Dumas, MD  oxyCODONE -acetaminophen  (PERCOCET/ROXICET) 5-325 MG tablet Take 1 tablet by mouth every 8 (eight) hours as needed for up to 5 days for severe pain (pain score 7-10). 12/27/23  01/01/24 Yes Copland, Skipper Dumas, MD  thyroid  (NP THYROID ) 120 MG tablet Take 1 tablet (120 mg total) by mouth daily before breakfast. 11/01/23  Yes Copland, Skipper Dumas, MD  blood glucose meter kit and supplies KIT Dispense based on patient and insurance preference. Use up to four times daily as directed. 09/10/20   Vann, Jessica U, DO  pantoprazole  (PROTONIX ) 40 MG tablet Take 1 tablet (40 mg total) by mouth daily. Patient not taking: Reported on 12/23/2023 10/19/23   Ephriam Hashimoto, MD    Current Facility-Administered Medications  Medication Dose Route Frequency Provider Last Rate Last Admin   0.9 %  sodium chloride  infusion (Manually program via Guardrails IV Fluids)   Intravenous Once Paliwal, Aditya, MD       0.9 %  sodium chloride  infusion   Intravenous Continuous Rancour, Stephen, MD 999 mL/hr at 01/06/2024 0700 Infusion Verify at 01/10/2024 0700   0.9 %  sodium chloride  infusion   Intra-arterial PRN Paliwal, Aditya, MD       0.9 %  sodium chloride  infusion  250 mL Intravenous Continuous Paliwal, Aditya, MD       cefTRIAXone  (ROCEPHIN ) 2 g in sodium chloride  0.9 % 100 mL IVPB  2 g Intravenous Q24H Payne, John D, PA-C       Chlorhexidine  Gluconate Cloth 2 % PADS 6 each  6 each Topical Q0600 Elonda Hale, MD   6 each at 12/13/2023 0400   docusate sodium  (COLACE) capsule 100 mg  100 mg Oral BID PRN Payne, John D, PA-C       furosemide  (LASIX ) injection 60 mg  60 mg Intravenous Once Chand, Sudham, MD       norepinephrine (LEVOPHED) 16 mg in 250mL (0.064 mg/mL) premix infusion  0-40 mcg/min Intravenous Titrated Trevor Fudge, MD       octreotide  (SANDOSTATIN ) 500 mcg in sodium chloride  0.9 % 250 mL (2 mcg/mL) infusion  50 mcg/hr Intravenous Continuous Rancour, Stephen, MD 25 mL/hr at 12/24/2023 0700 50 mcg/hr at 12/17/2023 0700   pantoprazole  (PROTONIX ) injection 40 mg  40 mg Intravenous Q12H Payne, John D, PA-C       polyethylene glycol (MIRALAX  / GLYCOLAX ) packet 17 g  17 g Oral Daily PRN Payne, John  D, PA-C       vasopressin (PITRESSIN) 20 Units in 100 mL (0.2 unit/mL) infusion-*FOR SHOCK*  0.04 Units/min Intravenous Continuous Trevor Fudge, MD 12 mL/hr at 01/05/2024 0700 0.04 Units/min at 12/28/2023 0700    Allergies as of 12/30/2023 - Review Complete 12/30/2023  Allergen Reaction Noted   Ciprofloxacin Other (See Comments) 07/19/2014   Codeine Other (See Comments) 07/19/2014   Flexeril [cyclobenzaprine] Other (See Comments) 09/25/2015   Imdur  [isosorbide  nitrate] Other (See Comments) 06/02/2018   Methocarbamol Other (See Comments) 09/25/2015   Other Other (See Comments) 09/15/2013   Strawberry extract Other (See Comments) 02/07/2019   Sulfa antibiotics Other (See Comments) 11/05/2015   Sulfamethoxazole Other (See Comments) 01/27/2012   Sulfonamide derivatives Other (See Comments)    Tomato Other (See Comments) 02/07/2019   Flagyl [metronidazole] Rash 08/09/2015  Family History  Problem Relation Age of Onset   Heart disease Mother    Heart disease Father    Kidney failure Father    Heart disease Sister    Heart attack Sister    Dementia Sister    Sjogren's syndrome Child    Hashimoto's thyroiditis Child    CAD Child     Social History   Socioeconomic History   Marital status: Married    Spouse name: Not on file   Number of children: Not on file   Years of education: Not on file   Highest education level: Not on file  Occupational History   Occupation: retired    Comment: Charity fundraiser [employer]  Tobacco Use   Smoking status: Former    Current packs/day: 0.00    Average packs/day: 2.5 packs/day for 30.0 years (75.0 ttl pk-yrs)    Types: Cigarettes    Start date: 08/29/1955    Quit date: 08/28/1985    Years since quitting: 38.3   Smokeless tobacco: Never  Substance and Sexual Activity   Alcohol use: Yes    Alcohol/week: 2.0 standard drinks of alcohol    Types: 2 Standard drinks or equivalent per week    Comment: 1 glass wine/week (prior 1 glass  martini with dinner)   Drug use: No   Sexual activity: Yes    Birth control/protection: None  Other Topics Concern   Not on file  Social History Narrative   Lives with wife--recently diagnosed with breast ca*; No smoking; Occassional wine; Retired; 2 daughters live in [place]--5 grandchildren(boys). On weight watchers. Lives in [place] with wife. Retired Animal nutritionist. Tobacco history 2 ppd x 32 years. No smoking x 25 years. No drugs.      *Wife describes herself as healthy, though recently had an episode of critically low hgb.    Social Drivers of Corporate investment banker Strain: Low Risk  (09/18/2019)   Overall Financial Resource Strain (CARDIA)    Difficulty of Paying Living Expenses: Not hard at all  Food Insecurity: No Food Insecurity (10/16/2023)   Hunger Vital Sign    Worried About Running Out of Food in the Last Year: Never true    Ran Out of Food in the Last Year: Never true  Transportation Needs: Unmet Transportation Needs (10/16/2023)   PRAPARE - Administrator, Civil Service (Medical): Yes    Lack of Transportation (Non-Medical): No  Physical Activity: Inactive (01/12/2022)   Exercise Vital Sign    Days of Exercise per Week: 0 days    Minutes of Exercise per Session: 0 min  Stress: No Stress Concern Present (01/12/2022)   [employer] of Occupational Health - Occupational Stress Questionnaire    Feeling of Stress : Not at all  Social Connections: Moderately Integrated (10/16/2023)   Social Connection and Isolation Panel    Frequency of Communication with Friends and Family: Three times a week    Frequency of Social Gatherings with Friends and Family: Once a week    Attends Religious Services: 1 to 4 times per year    Active Member of [employer] or Organizations: No    Attends Banker Meetings: Never    Marital Status: Married  Catering manager Violence: Patient Unable To Answer (10/16/2023)   Humiliation, Afraid, Rape, and Kick questionnaire     Fear of Current or Ex-Partner: Patient unable to answer    Emotionally Abused: Patient unable to answer    Physically Abused: Patient unable  to answer    Sexually Abused: Patient unable to answer    Review of Systems: Unable to obtain due to altered mental status  Physical Exam: Vital signs in last 24 hours: Temp:  [97.5 F (36.4 C)-98.7 F (37.1 C)] 98.2 F (36.8 C) (06/20 0739) Pulse Rate:  [77-111] 111 (06/20 0739) Resp:  [12-35] 19 (06/20 0739) BP: (71-164)/(24-146) 86/45 (06/20 0705) SpO2:  [92 %-100 %] 96 % (06/20 0739) Arterial Line BP: (70-159)/(37-62) 141/59 (06/20 0739) Weight:  [80 kg-86.4 kg] 86.4 kg (06/20 0310)   General:   Somnolent, arousable, not alert/oriented Head:  Normocephalic and atraumatic. Eyes:  Sclera icteric Ears:  Normal auditory acuity. Nose:  No deformity, discharge,  or lesions. Mouth:  No deformity or lesions.  Oropharynx pale and dry Neck:  Supple; no masses or thyromegaly. Lungs:  No visible respiratory distress Abdomen:  Moderate distended, no peritonitis  Msk:  Symmetrical without gross deformities. Normal posture. Pulses:  Normal pulses noted. Extremities:  Without clubbing or edema. Neurologic:  Alert and  oriented x4;  grossly normal neurologically. Skin:  Jaundiced, some ecchymoses, otherwise Intact without significant lesions or rashes. Cervical Nodes:  No significant cervical adenopathy. Psych:  Somnolent, not speaking coherently   Lab Results: Recent Labs    12/30/23 2357 12/29/2023 0023 12/14/2023 0024  WBC 9.4  --   --   HGB 8.9* 8.5* 9.2*  HCT 27.6* 25.0* 27.0*  PLT 133*  --   --    BMET Recent Labs    12/30/23 2357 12/28/2023 0023 01/07/2024 0024  NA 140 143 142  K 3.6 3.6 3.6  CL 110  --  105  CO2 20*  --   --   GLUCOSE 152*  --  146*  BUN 24*  --  25*  CREATININE 1.92*  --  1.90*  CALCIUM  8.3*  --   --    LFT Recent Labs    12/30/23 2357  PROT 5.3*  ALBUMIN 2.6*  AST 24  ALT 14  ALKPHOS 58  BILITOT  1.5*   PT/INR No results for input(s): LABPROT, INR in the last 72 hours.  Studies/Results: DG Chest Port 1 View Result Date: 01/05/2024 CLINICAL DATA:  Sepsis EXAM: PORTABLE CHEST 1 VIEW COMPARISON:  07/19/2023 FINDINGS: Minimal left basilar atelectasis. Lungs are otherwise clear. No pneumothorax or pleural effusion. Stable cardiomegaly. Left subclavian dual lead pacemaker unchanged. Pulmonary vascularity is normal. No acute bone abnormality. IMPRESSION: 1. Minimal left basilar atelectasis. 2. Stable cardiomegaly. Electronically Signed   By: Worthy Heads M.D.   On: 12/28/2023 00:37    Impression:   Recurrent hematemesis, last episode few hours ago. Hemorrhagic shock on pressor support.  Cirrhosis with known varices s/p EGD with banding a couple months ago.  Plan:   PPI, octreotide , volume/pressor support. Follow CBC, transfuse judiciously (given portal hypertension) as needed. S/P platelets and FFP; awaiting post-transfusion CBC and PT/INR Endoscopy if FFP acceptable and only after intubation. I have explained the high-risk nature of procedure but in overall context the benefits outweigh the risks. Risks (bleeding, infection, bowel perforation that could require surgery, sedation-related changes in cardiopulmonary systems), benefits (identification and possible treatment of source of symptoms, exclusion of certain causes of symptoms), and alternatives (watchful waiting, radiographic imaging studies, empiric medical treatment) of upper endoscopy (EGD) were explained to patient/family in detail and patient wishes to proceed.  Keep NPO. Case discussed with ICU team. [facility] will follow.   LOS: 0 days   Bonita Brindisi M  12/29/2023, 8:02  AM  Cell [phone] If no answer or after 5 PM call [phone]

## 2024-01-11 NOTE — Procedures (Signed)
 Central Venous Catheter Insertion Procedure Note  LARWENCE TU  [id number]  1934/05/27  Date:12/27/2023  Time:7:06 AM   Provider Performing:Tangala Wiegert D Marlys Singh   Procedure: Insertion of Non-tunneled Central Venous Catheter(36556) with US  guidance (40981)   Indication(s) Medication administration  Consent Risks of the procedure as well as the alternatives and risks of each were explained to the patient and/or caregiver.  Consent for the procedure was obtained and is signed in the bedside chart  Anesthesia Topical only with 1% lidocaine    Timeout Verified patient identification, verified procedure, site/side was marked, verified correct patient position, special equipment/implants available, medications/allergies/relevant history reviewed, required imaging and test results available.  Sterile Technique Maximal sterile technique including full sterile barrier drape, hand hygiene, sterile gown, sterile gloves, mask, hair covering, sterile ultrasound probe cover (if used).  Procedure Description Area of catheter insertion was cleaned with chlorhexidine  and draped in sterile fashion.  With real-time ultrasound guidance a central venous catheter was placed into the left femoral vein. Nonpulsatile blood flow and easy flushing noted in all ports.  The catheter was sutured in place and sterile dressing applied.  Complications/Tolerance None; patient tolerated the procedure well. Chest X-ray is ordered to verify placement for internal jugular or subclavian cannulation.   Chest x-ray is not ordered for femoral cannulation.  EBL Minimal  Specimen(s) None  JD Carliss Chess  Pulmonary & Critical Care 12/15/2023, 7:07 AM  Please see [email or web address] for pager details.  From 7A-7P if no response, please call [phone]. After hours, please call ELink [phone].

## 2024-01-11 NOTE — Progress Notes (Signed)
 Dr. Zaida Hertz informed me patient is to be made comfort care.  Endoscopy canceled.  Please call us  back if we can be of any further assistance.

## 2024-01-11 NOTE — Procedures (Signed)
 Arterial Catheter Insertion Procedure Note  ARVON SCHREINER  [id number]  1934-07-13  Date:01/10/2024  Time:7:06 AM    Provider Performing: Casimiro Cleaves    Procedure: Insertion of Arterial Line (29528) with US  guidance (41324)   Indication(s) Blood pressure monitoring and/or need for frequent ABGs  Consent Risks of the procedure as well as the alternatives and risks of each were explained to the patient and/or caregiver.  Consent for the procedure was obtained and is signed in the bedside chart  Anesthesia None   Time Out Verified patient identification, verified procedure, site/side was marked, verified correct patient position, special equipment/implants available, medications/allergies/relevant history reviewed, required imaging and test results available.   Sterile Technique Maximal sterile technique including full sterile barrier drape, hand hygiene, sterile gown, sterile gloves, mask, hair covering, sterile ultrasound probe cover (if used).   Procedure Description Area of catheter insertion was cleaned with chlorhexidine  and draped in sterile fashion. With real-time ultrasound guidance an arterial catheter was placed into the left femoral artery.  Appropriate arterial tracings confirmed on monitor.     Complications/Tolerance None; patient tolerated the procedure well.   EBL Minimal   Specimen(s) None  JD Carliss Chess [facility] 01/04/2024, 7:06 AM  Please see [email or web address] for pager details.  From 7A-7P if no response, please call [phone]. After hours, please call ELink [phone].

## 2024-01-11 NOTE — H&P (Addendum)
 NAME:  [name], MRN:  [id number], DOB:  06-30-1934, LOS: 0 ADMISSION DATE:  12/30/2023, CONSULTATION DATE:  01/05/2024 REFERRING MD:  ED, CHIEF COMPLAINT:  GI bleed   History of Present Illness:  89 y/o man with pmh of GI bleed 2/2 esophageal varices dementia, pAF not on AC, CAD s/p PCI, PAD s/p CEA coming in with upper gi bleed. Patient was in usoh till this evening when he had a small amount of hematemesis. This was followed by loss of consciousness necessitating EMS call.  EMS bagged him and brought him to the ed. In the ed, he as hypotensive to the 70s. He was  was found to have hemoglobin slightly lower than baseline. He was being treated for presumed sepsis when he had multiple bout of emesis, initially coffee ground, then bright red. He was given a unit of prbc with improvement in Blood pressure. CTA was attempted but he had contineud emesis for which the trip was postponed. GI was consulted for urgent EGD.  Of note, he had presented in April 2025 with hematemesis and melena and had undergone EGD which showed large varices and red wale sign. He had 3 bands placed. Gastric varices couldn't be ruled out.  He was also newly diagnosed with cirrhosis during that admission.   He continued to have persistent hypotension for which second unit of blood was given. He was started on low dose levophed.   Of note, his wife reports he has been having some back pain and she has been sparingly using ibuprofen and tylenol  . She also reports he is DNR but ok for short term intubation.   Pertinent  Medical History  As above  Significant Hospital Events: Including procedures, antibiotic start and stop dates in addition to other pertinent events   2L fluids, 2unit PRBC, ppi, octreotide , abx. Levophed.   Interim History / Subjective:  As above  Objective    Blood pressure (!) 96/46, pulse 86, temperature 98.7 F (37.1 C), temperature source Temporal, resp. rate (!) 23, height 5' 6 (1.676 m), weight  80 kg, SpO2 97%.        Intake/Output Summary (Last 24 hours) at 12/18/2023 0320 Last data filed at 01/07/2024 0156 Gross per 24 hour  Intake 2362.02 ml  Output --  Net 2362.02 ml   Filed Weights   12/30/23 2359  Weight: 80 kg    Examination: General: awake, Hard of hearing, oriented. HENT: eomi, mmm, petechiae on face Lungs: coarse breath sounds Cardiovascular: tachycardic, systolic murmur in LLSB Abdomen: ttp in MEG Extremities: no c/c/ cool to touch Neuro: awake, oriented to self and place.    Resolved problem list   Assessment and Plan  GI bleed: possibly 2/2 esophageal vs gastric varices. Cannot rule out gastritics ve peptic ulcer disease. --PPI 80mg  x1, followed by PPI BID --octreotide  --type and screen --multiple peripheral iv lines --2units prbc, will give another unit, will consider ffp and platelets. --start levophed for now --gi consulted, plan for egd --patients wife reports he is ok for short term intubation.   Cirrhosis: new diagnosis --management as above --may need rifaximin and lactulose  --ceftriaxone  for sbp ppx.  --may need b blocker initiation once stabilzed.   Hypotension: hemorrahgic shock vs septic shock --transfuse per pressor requirement --f/u cultures.  Best Practice (right click and Reselect all SmartList Selections daily)   Diet/type: NPO DVT prophylaxis not indicated Pressure ulcer(s): N/A GI prophylaxis: PPI Lines: N/A Foley:  N/A Code Status:  DNR Last date of  multidisciplinary goals of care discussion [5]  Labs   CBC: Recent Labs  Lab 12/30/23 2357 01/02/2024 0023 01/02/2024 0024  WBC 9.4  --   --   NEUTROABS 5.4  --   --   HGB 8.9* 8.5* 9.2*  HCT 27.6* 25.0* 27.0*  MCV 102.6*  --   --   PLT 133*  --   --     Basic Metabolic Panel: Recent Labs  Lab 12/30/23 2357 12/26/2023 0023 12/18/2023 0024  NA 140 143 142  K 3.6 3.6 3.6  CL 110  --  105  CO2 20*  --   --   GLUCOSE 152*  --  146*  BUN 24*  --  25*   CREATININE 1.92*  --  1.90*  CALCIUM  8.3*  --   --    GFR: Estimated Creatinine Clearance: 26.7 mL/min (A) (by C-G formula based on SCr of 1.9 mg/dL (H)). Recent Labs  Lab 12/30/23 2357 12/27/2023 0024  WBC 9.4  --   LATICACIDVEN  --  3.4*    Liver Function Tests: Recent Labs  Lab 12/30/23 2357  AST 24  ALT 14  ALKPHOS 58  BILITOT 1.5*  PROT 5.3*  ALBUMIN 2.6*   Recent Labs  Lab 12/30/23 2357  LIPASE 22   Recent Labs  Lab 01/03/2024 0033  AMMONIA 24    ABG    Component Value Date/Time   HCO3 22.6 01/07/2024 0023   TCO2 21 (L) 12/19/2023 0024   ACIDBASEDEF 4.0 (H) 01/04/2024 0023   O2SAT 89 12/26/2023 0023     Coagulation Profile: No results for input(s): INR, PROTIME in the last 168 hours.  Cardiac Enzymes: No results for input(s): CKTOTAL, CKMB, CKMBINDEX, TROPONINI in the last 168 hours.  HbA1C: Hemoglobin A1C  Date/Time Value Ref Range Status  03/03/2017 12:00 AM 5.2  Final   Hgb A1c MFr Bld  Date/Time Value Ref Range Status  10/16/2023 06:29 AM 4.7 (L) 4.8 - 5.6 % Final    Comment:    (NOTE) Pre diabetes:          5.7%-6.4%  Diabetes:              >6.4%  Glycemic control for   <7.0% adults with diabetes   08/12/2022 06:08 PM 5.6 4.8 - 5.6 % Final    Comment:    (NOTE) Pre diabetes:          5.7%-6.4%  Diabetes:              >6.4%  Glycemic control for   <7.0% adults with diabetes     CBG: No results for input(s): GLUCAP in the last 168 hours.  Review of Systems:   12 point ros negative except above  Past Medical History:  He,  has a past medical history of Anginal pain (HCC), Anxiety, Arthritis, Asthma due to seasonal allergies, Carotid artery occlusion, Complication of anesthesia, Coronary artery disease, Diverticulitis, Family history of anesthesia complication, GERD (gastroesophageal reflux disease), H/O hiatal hernia, Hearing aid worn, Hyperlipemia, Hypertension, Kidney stone, Prostate CA (HCC), Shortness of  breath, Thyroid  disease, TIA (transient ischemic attack), and Tinnitus.   Surgical History:   Past Surgical History:  Procedure Laterality Date   CARDIAC CATHETERIZATION  2008   minimal dz, Dr Floria Hurst   CARDIAC CATHETERIZATION N/A 10/09/2015   Procedure: Left Heart Cath and Coronary Angiography;  Surgeon: Peter M Swaziland, MD;  Location: [facility] INVASIVE CV LAB;  Service: Cardiovascular;  Laterality: N/A;   CARDIAC CATHETERIZATION N/A  10/09/2015   Procedure: Intravascular Pressure Wire/FFR Study;  Surgeon: Peter M Swaziland, MD;  Location: [facility] INVASIVE CV LAB;  Service: Cardiovascular;  Laterality: N/A;   CARDIAC CATHETERIZATION N/A 10/09/2015   Procedure: Coronary Stent Intervention;  Surgeon: Peter M Swaziland, MD;  Location: [facility] INVASIVE CV LAB;  Service: Cardiovascular;  Laterality: N/A;   CAROTID ENDARTERECTOMY  Left   1998   CHOLECYSTECTOMY     CORONARY STENT PLACEMENT  10/09/2015   DES to RCA   ESOPHAGEAL BANDING  10/16/2023   Procedure: ESOPHAGOSCOPY, WITH ESOPHAGEAL VARICES BAND LIGATION;  Surgeon: Evangeline Hilts, MD;  Location: [facility] ENDOSCOPY;  Service: Gastroenterology;;   ESOPHAGOGASTRODUODENOSCOPY Left 10/16/2023   Procedure: EGD (ESOPHAGOGASTRODUODENOSCOPY);  Surgeon: Evangeline Hilts, MD;  Location: [facility] ENDOSCOPY;  Service: Gastroenterology;  Laterality: Left;   ESOPHAGOGASTRODUODENOSCOPY (EGD) WITH PROPOFOL  Left 03/26/2017   Procedure: ESOPHAGOGASTRODUODENOSCOPY (EGD) WITH PROPOFOL ;  Surgeon: Evangeline Hilts, MD;  Location: [facility] ENDOSCOPY;  Service: Endoscopy;  Laterality: Left;   LEFT HEART CATHETERIZATION WITH CORONARY ANGIOGRAM N/A 09/15/2013   Procedure: LEFT HEART CATHETERIZATION WITH CORONARY ANGIOGRAM;  Surgeon: Peter M Swaziland, MD;  Location: [facility] CATH LAB;  Service: Cardiovascular;  Laterality: N/A;   PACEMAKER IMPLANT N/A 08/12/2022   Procedure: PACEMAKER IMPLANT;  Surgeon: Lei Pump, MD;  Location: [facility] INVASIVE CV LAB;  Service: Cardiovascular;  Laterality: N/A;   PROSTATECTOMY  1998    radical for prostate cancer   TEMPORARY PACEMAKER N/A 08/11/2022   Procedure: TEMPORARY PACEMAKER;  Surgeon: Knox Perl, MD;  Location: [facility] INVASIVE CV LAB;  Service: Cardiovascular;  Laterality: N/A;     Social History:   reports that he quit smoking about 38 years ago. His smoking use included cigarettes. He started smoking about 68 years ago. He has a 75 pack-year smoking history. He has never used smokeless tobacco. He reports current alcohol use of about 2.0 standard drinks of alcohol per week. He reports that he does not use drugs.   Family History:  His family history includes CAD in his child; Dementia in his sister; Hashimoto's thyroiditis in his child; Heart attack in his sister; Heart disease in his father, mother, and sister; Kidney failure in his father; Sjogren's syndrome in his child.   Allergies Allergies  Allergen Reactions   Ciprofloxacin Other (See Comments)    Hallucinations or jitteriness   Codeine Other (See Comments)    Hallucinations    Flexeril [Cyclobenzaprine] Other (See Comments)    Made me feel goofy   Imdur  [Isosorbide  Nitrate] Other (See Comments)    Caused headaches   Methocarbamol Other (See Comments)    Made me feel goofy   Other Other (See Comments)    CANNOT HAVE ANY FOODS WITH SEEDS    Strawberry Extract Other (See Comments)    CANNOT HAVE ANY FOODS WITH SEEDS   Sulfa Antibiotics Other (See Comments)    Chills and shaking- serum sickness    Sulfamethoxazole Other (See Comments)    Chills and shaking- serum sickness   Sulfonamide Derivatives Other (See Comments)    Chills and shaking serum sickness   Tomato Other (See Comments)    CANNOT HAVE ANY FOODS WITH SEEDS   Flagyl [Metronidazole] Rash     Home Medications  Prior to Admission medications   Medication Sig Start Date End Date Taking? Authorizing Provider  aspirin  EC 81 MG tablet Take 81 mg by mouth daily. Swallow whole.   Yes [provider]  dapagliflozin   propanediol (FARXIGA ) 5 MG TABS tablet Take 1 tablet (5  mg total) by mouth daily. 11/01/23  Yes Copland, Skipper Dumas, MD  oxyCODONE -acetaminophen  (PERCOCET/ROXICET) 5-325 MG tablet Take 1 tablet by mouth every 8 (eight) hours as needed for up to 5 days for severe pain (pain score 7-10). 12/27/23 01/01/24 Yes Copland, Skipper Dumas, MD  thyroid  (NP THYROID ) 120 MG tablet Take 1 tablet (120 mg total) by mouth daily before breakfast. 11/01/23  Yes Copland, Skipper Dumas, MD  blood glucose meter kit and supplies KIT Dispense based on patient and insurance preference. Use up to four times daily as directed. 09/10/20   Vann, Jessica U, DO  pantoprazole  (PROTONIX ) 40 MG tablet Take 1 tablet (40 mg total) by mouth daily. Patient not taking: Reported on 01/10/2024 10/19/23   Ephriam Hashimoto, MD     Critical care time: 47 minutes    GI was paged by ED on admission, updated them this am regarding decompensation

## 2024-01-11 NOTE — Progress Notes (Signed)
 NAME:  [name], MRN:  [id number], DOB:  [birth year], LOS: 0 ADMISSION DATE:  12/30/2023, CONSULTATION DATE:  01/07/2024 REFERRING MD:  ED, CHIEF COMPLAINT:  GI bleed   History of Present Illness:  89 y/o man with pmh of GI bleed 2/2 esophageal varices dementia, pAF not on AC, CAD s/p PCI, PAD s/p CEA coming in with upper gi bleed. Patient was in usoh till this evening when he had a small amount of hematemesis. This was followed by loss of consciousness necessitating EMS call.  EMS bagged him and brought him to the ed. In the ed, he as hypotensive to the 70s. He was  was found to have hemoglobin slightly lower than baseline. He was being treated for presumed sepsis when he had multiple bout of emesis, initially coffee ground, then bright red. He was given a unit of prbc with improvement in Blood pressure. CTA was attempted but he had contineud emesis for which the trip was postponed. GI was consulted for urgent EGD.  Of note, he had presented in April 2025 with hematemesis and melena and had undergone EGD which showed large varices and red wale sign. He had 3 bands placed. Gastric varices couldn't be ruled out.  He was also newly diagnosed with cirrhosis during that admission.   He continued to have persistent hypotension for which second unit of blood was given. He was started on low dose levophed.   Of note, his wife reports he has been having some back pain and she has been sparingly using ibuprofen and tylenol  . She also reports he is DNR but ok for short term intubation.   Pertinent  Medical History   Past Medical History:  Diagnosis Date   Anginal pain (HCC)    Anxiety     OCCASIONAL   Arthritis    Asthma due to seasonal allergies    uses inhalers prn   Carotid artery occlusion    Left   Complication of anesthesia     had tremors after prostate surgery   Coronary artery disease    Diverticulitis    recurrent   Family history of anesthesia complication    MOTHER    GERD  (gastroesophageal reflux disease)    H/O hiatal hernia    Hearing aid worn    Hyperlipemia    Hypertension    Kidney stone    lithotripsy 2011 w complications, req stents, Dr Nolon Baxter   Prostate CA [facility])    Shortness of breath    Thyroid  disease    TIA (transient ischemic attack)    Tinnitus      Significant Hospital Events: Including procedures, antibiotic start and stop dates in addition to other pertinent events   6/20 admitted hemorrhagic shock  Interim History / Subjective:  Patient became hypotensive, requiring Levophed and vasopressin Received multiple PRBCs, FFP and platelet Awaiting INR and repeat CBC before proceeding with intubation and possible EGD  Objective    Blood pressure (!) 86/45, pulse (!) 111, temperature 98.2 F (36.8 C), temperature source Bladder, resp. rate 19, height 5' 6 (1.676 m), weight 86.4 kg, SpO2 96%.        Intake/Output Summary (Last 24 hours) at 12/28/2023 0753 Last data filed at 12/26/2023 0739 Gross per 24 hour  Intake 4879.08 ml  Output --  Net 4879.08 ml   Filed Weights   12/30/23 2359 12/28/2023 0310  Weight: 80 kg 86.4 kg    Examination: General: Critically ill-appearing elderly male, lying on the bed HEENT:  [place]/AT, eyes anicteric.  moist mucus membranes Neuro: Lethargic, opens eyes with vocal stimuli, confused Chest: Tachypneic, coarse breath sounds, no wheezes or rhonchi Heart: Regular rate and rhythm, no murmurs or gallops Abdomen: Soft, nondistended, bowel sounds present   Labs and images reviewed  Patient Lines/Drains/Airways Status     Active Line/Drains/Airways     Name Placement date Placement time Site Days   Arterial Line 01/07/2024 Left Femoral 12/15/2023  0714  Femoral  less than 1   Peripheral IV 12/30/23 20 G Anterior;Left Forearm 12/30/23  --  Forearm  1   Peripheral IV 12/12/2023 Anterior;Proximal;Right Forearm 12/20/2023  0041  Forearm  less than 1   Peripheral IV 12/16/2023 20 G Anterior;Right Forearm 01/08/2024   --  Forearm  less than 1   Peripheral IV 12/26/2023 22 G Anterior;Left Hand 12/16/2023  --  Hand  less than 1   CVC Triple Lumen 01/01/2024 Left Femoral 20 cm 12/14/2023  0710  -- less than 1   Wound / Incision (Open or Dehisced) 10/17/23 Other (Comment) Pretibial Proximal;Right Abrasion 10/17/23  1900  Pretibial  75         Resolved problem list   Assessment and Plan  Acute GI bleeding likely upper with a differential esophageal varices versus peptic ulcer disease with background history of liver cirrhosis and ibuprofen intake for back pain Hemorrhagic shock Acute blood loss anemia Monitor H&H and transfuse if less than 7 Repeat CBC with platelet and INR is pending GI has been consulted, if INR remained stable with a stable platelets, patient will be intubated before proceeding with EGD Continue vasopressor support with MAP goal 55-65 to prevent further bleeding Continue octreotide  and Protonix  Spoke with patient's wife, patient is DNR/DNI but she is okay with short-term intubation  Liver cirrhosis, unclear etiology Portal hypertension Patient's ammonia level is 24 Continue ceftriaxone  for SBP prophylaxis Beta-blocker or contraindicated in the setting of active bleeding  CKD stage IIIb Patient's serum creatinine is at baseline between 1.7-1.9 Monitor intake and output Avoid nephrotoxic agent  Thrombocytopenia, likely due to liver cirrhosis Monitor platelet count  Obesity Diet and exercise counseling as appropriate   Best Practice (right click and Reselect all SmartList Selections daily)   Diet/type: NPO DVT prophylaxis SCD Pressure ulcer(s): Please see nursing notes GI prophylaxis: PPI Lines: Arterial line and Central line, still needed Foley: Still needed Code Status:  DNR Last date of multidisciplinary goals of care discussion [6/20: Patient's wife was updated at bedside]  Labs   CBC: Recent Labs  Lab 12/30/23 2357 12/29/2023 0023 12/28/2023 0024  WBC 9.4  --   --    NEUTROABS 5.4  --   --   HGB 8.9* 8.5* 9.2*  HCT 27.6* 25.0* 27.0*  MCV 102.6*  --   --   PLT 133*  --   --     Basic Metabolic Panel: Recent Labs  Lab 12/30/23 2357 12/17/2023 0023 01/04/2024 0024  NA 140 143 142  K 3.6 3.6 3.6  CL 110  --  105  CO2 20*  --   --   GLUCOSE 152*  --  146*  BUN 24*  --  25*  CREATININE 1.92*  --  1.90*  CALCIUM  8.3*  --   --    GFR: Estimated Creatinine Clearance: 27.7 mL/min (A) (by C-G formula based on SCr of 1.9 mg/dL (H)). Recent Labs  Lab 12/30/23 2357 12/20/2023 0024 01/05/2024 0520  WBC 9.4  --   --   LATICACIDVEN  --  3.4* 7.1*    Liver Function Tests: Recent Labs  Lab 12/30/23 2357  AST 24  ALT 14  ALKPHOS 58  BILITOT 1.5*  PROT 5.3*  ALBUMIN 2.6*   Recent Labs  Lab 12/30/23 2357  LIPASE 22   Recent Labs  Lab 12/18/2023 0033  AMMONIA 24    ABG    Component Value Date/Time   HCO3 22.6 12/27/2023 0023   TCO2 21 (L) 12/28/2023 0024   ACIDBASEDEF 4.0 (H) 01/05/2024 0023   O2SAT 89 12/23/2023 0023     Coagulation Profile: No results for input(s): INR, PROTIME in the last 168 hours.  Cardiac Enzymes: No results for input(s): CKTOTAL, CKMB, CKMBINDEX, TROPONINI in the last 168 hours.  HbA1C: Hemoglobin A1C  Date/Time Value Ref Range Status  03/03/2017 12:00 AM 5.2  Final   Hgb A1c MFr Bld  Date/Time Value Ref Range Status  10/16/2023 06:29 AM 4.7 (L) 4.8 - 5.6 % Final    Comment:    (NOTE) Pre diabetes:          5.7%-6.4%  Diabetes:              >6.4%  Glycemic control for   <7.0% adults with diabetes   08/12/2022 06:08 PM 5.6 4.8 - 5.6 % Final    Comment:    (NOTE) Pre diabetes:          5.7%-6.4%  Diabetes:              >6.4%  Glycemic control for   <7.0% adults with diabetes     CBG: Recent Labs  Lab 01/09/2024 0349  GLUCAP 132*    The patient is critically ill due to hemorrhagic shock due to GI bleeding.  Critical care was necessary to treat or prevent imminent or  life-threatening deterioration.  Critical care was time spent personally by me on the following activities: development of treatment plan with patient and/or surrogate as well as nursing, discussions with consultants, evaluation of patient's response to treatment, examination of patient, obtaining history from patient or surrogate, ordering and performing treatments and interventions, ordering and review of laboratory studies, ordering and review of radiographic studies, pulse oximetry, re-evaluation of patient's condition and participation in multidisciplinary rounds.   During this encounter critical care time was devoted to patient care services described in this note for 46 minutes.     Trevor Fudge, MD [facility] See Amion for pager If no response to pager, please call [phone] until 7pm After 7pm, Please call E-link [phone]

## 2024-01-11 NOTE — Progress Notes (Signed)
   12/15/2023 0930  Spiritual Encounters  Type of Visit Initial  Care provided to: Family  Referral source Nurse (RN/NT/LPN)  Reason for visit Patient death  OnCall Visit No  Spiritual Framework  Presenting Themes Meaning/purpose/sources of inspiration;Values and beliefs;Significant life change;Impactful experiences and emotions;Community and relationships;Rituals and practive;Courage hope and growth  Interventions  Spiritual Care Interventions Made Established relationship of care and support;Compassionate presence;Reflective listening;Normalization of emotions;Reconciliation with self/others;Narrative/life review;Explored values/beliefs/practices/strengths;Meaning making;Provided grief education;Bereavement/grief support;Prayer;Encouragement  Intervention Outcomes  Outcomes Connection to spiritual care;Awareness around self/spiritual resourses;Connection to values and goals of care;Reduced anxiety;Reduced fear;Reduced isolation;Patient family open to resources    Chaplain responded to unit page request for Last Rites. Upon arrival, pt had just passed. Chaplain ministered to pt's wife [name] as indicated above, for which she expressed much gratitude. Son is also on the way. Chaplains remain available.

## 2024-01-11 NOTE — Sepsis Progress Note (Signed)
 Following for sepsis monitoring ?

## 2024-01-11 NOTE — ED Provider Notes (Signed)
 [place] EMERGENCY DEPARTMENT AT [facility] Provider Note   CSN: [id number] Arrival date & time: 12/30/23  2353     Patient presents with: Loss of Consciousness   [name] is a 89 y.o. male.    89 y.o. M with moderate dementia, lives at home, pAF not on [facility] due to hx GIB, CAD s/p PCI remote, HLD, TIA and PVD s/p CEA, HTN, obesity, and prosCA   Level 5 caveat here for dementia.  Patient moaning in pain.  History provided by wife Albion by phone.  He apparently has been having intermittent low back pain for the past several weeks.  Saw his PCP and had x-rays last week with unknown results.  No trauma.  Watching television tonight patient suddenly said he was going to vomit.  He did vomit 1 time.  Few minutes after this he became unresponsive and slumped over in a chair.  EMS was called and found patient to have agonal breathing.  He was bagged for about 15 minutes but did not give any Narcan .  Does take oxycodone  but wife states he did not receive any excessive pain medication and does not manage his own medications.  She is not aware of any fall or trauma.  No recent black or bloody stools.  She does not know if the emesis was bloody but he does have a history of varices. Does have back pain.  Hypotensive for EMS in the 80s and 70s on arrival.  He is complaining of pain to his abdomen, back and chest on all over.  Hypotensive on arrival.  Wife states he does have a DNR and would not want to be intubated or have CPR performed.  She is agreeable to fluids, antibiotics and blood products as needed.  He is moaning and complaining of pain all over especially to his abdomen and back.  He is hypotensive in the 70s.  Hemoccult is negative.   The history is provided by the patient.  Loss of Consciousness      Prior to Admission medications   Medication Sig Start Date End Date Taking? Authorizing Provider  oxyCODONE -acetaminophen  (PERCOCET/ROXICET) 5-325 MG tablet Take 1 tablet  by mouth every 8 (eight) hours as needed for up to 5 days for severe pain (pain score 7-10). 12/27/23 01/01/24  Copland, Skipper Dumas, MD  acetaminophen  (TYLENOL ) 500 MG tablet Take 500 mg by mouth every 6 (six) hours as needed for moderate pain (pain score 4-6). Patient not taking: Reported on 12/23/2023    [provider]  blood glucose meter kit and supplies KIT Dispense based on patient and insurance preference. Use up to four times daily as directed. 09/10/20   Vann, Jessica U, DO  dapagliflozin  propanediol (FARXIGA ) 5 MG TABS tablet Take 1 tablet (5 mg total) by mouth daily. 11/01/23   Copland, Skipper Dumas, MD  guaiFENesin -dextromethorphan  (ROBITUSSIN DM) 100-10 MG/5ML syrup Take 10 mLs by mouth every 4 (four) hours as needed for cough (chest congestion). 07/21/23   Gherghe, Costin M, MD  HYDROcodone -acetaminophen  (NORCO/VICODIN) 5-325 MG tablet Take 0.5-1 tablets by mouth every 6 (six) hours as needed for moderate pain (pain score 4-6). 12/23/23   Trenton Frock, PA-C  pantoprazole  (PROTONIX ) 40 MG tablet Take 1 tablet (40 mg total) by mouth daily. 10/19/23   Ephriam Hashimoto, MD  thyroid  (NP THYROID ) 120 MG tablet Take 1 tablet (120 mg total) by mouth daily before breakfast. 11/01/23   Copland, Jessica C, MD    Allergies: Ciprofloxacin,  Codeine, Flexeril [cyclobenzaprine], Imdur  [isosorbide  nitrate], Methocarbamol, Other, Strawberry extract, Sulfa antibiotics, Sulfamethoxazole, Sulfonamide derivatives, Tomato, and Flagyl [metronidazole]    Review of Systems  Unable to perform ROS: Acuity of condition  Cardiovascular:  Positive for syncope.    Updated Vital Signs BP (!) 87/41   Pulse 88   Temp (!) 97.5 F (36.4 C) (Temporal)   Resp 19   Ht 5' 6 (1.676 m)   Wt 80 kg   SpO2 97%   BMI 28.47 kg/m   Physical Exam Vitals and nursing note reviewed.  Constitutional:      General: He is not in acute distress.    Appearance: Normal appearance. He is well-developed and normal weight. He  is ill-appearing.     Comments: Pale appearing, moaning in pain  HENT:     Head: Normocephalic and atraumatic.     Mouth/Throat:     Pharynx: No oropharyngeal exudate.   Eyes:     Conjunctiva/sclera: Conjunctivae normal.     Pupils: Pupils are equal, round, and reactive to light.   Neck:     Comments: No meningismus. Cardiovascular:     Rate and Rhythm: Normal rate and regular rhythm.     Heart sounds: Normal heart sounds. No murmur heard. Pulmonary:     Effort: Pulmonary effort is normal. No respiratory distress.     Breath sounds: Normal breath sounds.     Comments: Ecchymosis left ribs, no crepitus Chest:     Chest wall: Tenderness present.  Abdominal:     Palpations: Abdomen is soft.     Tenderness: There is abdominal tenderness. There is guarding. There is no rebound.     Comments: Diffuse tenderness with voluntary guarding  Genitourinary:    Comments: No gross blood on rectal exam  Musculoskeletal:        General: No tenderness. Normal range of motion.     Cervical back: Normal range of motion and neck supple.     Comments: Tender lumbar spine without step-offs.   Skin:    General: Skin is warm.     Capillary Refill: Capillary refill takes less than 2 seconds.     Findings: Bruising and erythema present.     Comments: Scattered areas of ecchymosis and bruising throughout arms and legs.   Neurological:     Mental Status: He is alert.     Cranial Nerves: No cranial nerve deficit.     Motor: No abnormal muscle tone.     Coordination: Coordination normal.     Comments: Oriented to person and place.  Moves all extremities equally and follows commands.  Psychiatric:        Behavior: Behavior normal.     (all labs ordered are listed, but only abnormal results are displayed) Labs Reviewed  CBC WITH DIFFERENTIAL/PLATELET - Abnormal; Notable for the following components:      Result Value   RBC 2.69 (*)    Hemoglobin 8.9 (*)    HCT 27.6 (*)    MCV 102.6 (*)     RDW 16.9 (*)    Platelets 133 (*)    Monocytes Absolute 1.3 (*)    Eosinophils Absolute 1.0 (*)    Abs Immature Granulocytes 0.43 (*)    All other components within normal limits  COMPREHENSIVE METABOLIC PANEL WITH GFR - Abnormal; Notable for the following components:   CO2 20 (*)    Glucose, Bld 152 (*)    BUN 24 (*)    Creatinine, Ser 1.92 (*)  Calcium  8.3 (*)    Total Protein 5.3 (*)    Albumin 2.6 (*)    Total Bilirubin 1.5 (*)    GFR, Estimated 33 (*)    All other components within normal limits  SALICYLATE LEVEL - Abnormal; Notable for the following components:   Salicylate Lvl <7.0 (*)    All other components within normal limits  LACTIC ACID, PLASMA - Abnormal; Notable for the following components:   Lactic Acid, Venous 7.1 (*)    All other components within normal limits  GLUCOSE, CAPILLARY - Abnormal; Notable for the following components:   Glucose-Capillary 132 (*)    All other components within normal limits  I-STAT CG4 LACTIC ACID, ED - Abnormal; Notable for the following components:   Lactic Acid, Venous 3.4 (*)    All other components within normal limits  I-STAT CHEM 8, ED - Abnormal; Notable for the following components:   BUN 25 (*)    Creatinine, Ser 1.90 (*)    Glucose, Bld 146 (*)    TCO2 21 (*)    Hemoglobin 9.2 (*)    HCT 27.0 (*)    All other components within normal limits  I-STAT VENOUS BLOOD GAS, ED - Abnormal; Notable for the following components:   pO2, Ven 65 (*)    Acid-base deficit 4.0 (*)    HCT 25.0 (*)    Hemoglobin 8.5 (*)    All other components within normal limits  TROPONIN I (HIGH SENSITIVITY) - Abnormal; Notable for the following components:   Troponin I (High Sensitivity) 23 (*)    All other components within normal limits  RESP PANEL BY RT-PCR (RSV, FLU A&B, COVID)  RVPGX2  MRSA NEXT GEN BY PCR, NASAL  CULTURE, BLOOD (ROUTINE X 2)  CULTURE, BLOOD (ROUTINE X 2)  LIPASE, BLOOD  AMMONIA  ACETAMINOPHEN  LEVEL  ETHANOL  APTT   RAPID URINE DRUG SCREEN, HOSP PERFORMED  PROTIME-INR  URINALYSIS, W/ REFLEX TO CULTURE (INFECTION SUSPECTED)  CBC  BASIC METABOLIC PANEL WITH GFR  TSH  FIBRINOGEN  HEMOGLOBIN AND HEMATOCRIT, BLOOD  HEMOGLOBIN AND HEMATOCRIT, BLOOD  HEMOGLOBIN AND HEMATOCRIT, BLOOD  PROTIME-INR  BLOOD GAS, ARTERIAL  CBC  PROTIME-INR  BASIC METABOLIC PANEL WITH GFR  POC OCCULT BLOOD, ED  I-STAT CG4 LACTIC ACID, ED  TYPE AND SCREEN  PREPARE RBC (CROSSMATCH)  PREPARE RBC (CROSSMATCH)  PREPARE RBC (CROSSMATCH)  PREPARE RBC (CROSSMATCH)  PREPARE PLATELET PHERESIS  PREPARE FRESH FROZEN PLASMA  TROPONIN I (HIGH SENSITIVITY)    EKG: EKG Interpretation Date/Time:  Friday December 31 2023 00:08:50 EDT Ventricular Rate:  88 PR Interval:  206 QRS Duration:  127 QT Interval:  420 QTC Calculation: 509 R Axis:   -75  Text Interpretation: Sinus rhythm Nonspecific IVCD with LAD Left ventricular hypertrophy No significant change was found Confirmed by Earma Gloss [phone]) on 12/22/2023 12:17:52 AM  Radiology: [facility] Query Chest Port 1 View Result Date: 01/04/2024 CLINICAL DATA:  Sepsis EXAM: PORTABLE CHEST 1 VIEW COMPARISON:  07/19/2023 FINDINGS: Minimal left basilar atelectasis. Lungs are otherwise clear. No pneumothorax or pleural effusion. Stable cardiomegaly. Left subclavian dual lead pacemaker unchanged. Pulmonary vascularity is normal. No acute bone abnormality. IMPRESSION: 1. Minimal left basilar atelectasis. 2. Stable cardiomegaly. Electronically Signed   By: Worthy Heads M.D.   On: 01/09/2024 00:37     .Critical Care  Performed by: Earma Gloss, MD Authorized by: Earma Gloss, MD   Critical care provider statement:    Critical care time (minutes):  75   Critical  care time was exclusive of:  Separately billable procedures and treating other patients   Critical care was necessary to treat or prevent imminent or life-threatening deterioration of the following conditions:  Shock and trauma    Critical care was time spent personally by me on the following activities:  Development of treatment plan with patient or surrogate, discussions with consultants, evaluation of patient's response to treatment, examination of patient, ordering and review of laboratory studies, ordering and review of radiographic studies, ordering and performing treatments and interventions, pulse oximetry, re-evaluation of patient's condition, review of old charts, blood draw for specimens and obtaining history from patient or surrogate   I assumed direction of critical care for this patient from another provider in my specialty: no     Care discussed with: admitting provider   Ultrasound ED FAST  Date/Time: 12/14/2023 12:46 AM  Performed by: Earma Gloss, MD Authorized by: Earma Gloss, MD  Procedure details:    Indications: blunt abdominal trauma and blunt chest trauma       Assess for:  Hemothorax and intra-abdominal fluid    Technique:  Abdominal    Images: archived    Study Limitations: body habitus  Abdominal findings:    L kidney:  Visualized   R kidney:  Visualized   Liver:  Visualized    Bladder:  Visualized, Foley catheter not visualized   Hepatorenal space visualized: identified     Splenorenal space: identified     Rectovesical free fluid: not identified     Splenorenal free fluid: not identified     Hepatorenal space free fluid: not identified      Medications Ordered in the ED  lactated ringers  infusion (has no administration in time range)  lactated ringers  bolus 1,000 mL (has no administration in time range)  ceFEPIme (MAXIPIME) 2 g in sodium chloride  0.9 % 100 mL IVPB (has no administration in time range)  lactated ringers  bolus 1,000 mL (has no administration in time range)  vancomycin (VANCOREADY) IVPB 2000 mg/400 mL (has no administration in time range)  0.9 %  sodium chloride  infusion (Manually program via Guardrails IV Fluids) (has no administration in time range)   norepinephrine (LEVOPHED) 4mg  in (0.016 mg/mL) premix infusion (has no administration in time range)  pantoprazole  (PROTONIX ) injection 40 mg (has no administration in time range)                                    Medical Decision Making Amount and/or Complexity of Data Reviewed Independent Historian: spouse and EMS Labs: ordered. Decision-making details documented in ED Course. Radiology: ordered and independent interpretation performed. Decision-making details documented in ED Course. ECG/medicine tests: ordered and independent interpretation performed. Decision-making details documented in ED Course.  Risk Prescription drug management. Decision regarding hospitalization.   Patient arrives from home with vomiting and syncope.  Hypotensive and pale appearing on arrival.  Moaning complaining of pain all over.  Discussed with wife by phone.  He is DNR and DNI.  She is agreeable to IV fluids, blood products and antibiotics as necessary. Does have a history of esophageal varices but wife uncertain if his vomit was bloody or not.  No black or bloody stools.  He has been getting NSAIDs for his low back pain.  Did have x-rays by his PCP with results unknown.  Suspect x-rays are  Will activate code sepsis.  Unclear whether this represents sepsis versus hypotension versus  trauma versus bleeding.  Given emergency release blood, fluids and antibiotics.  FAST exam shows no free fluid.  Blood pressure has improved to 148/100.  Patient taken to CT scan with concern for possible traumatic injury and ongoing abdominal pain and back pain.  Upon going to CT scan patient had profound hematemesis.  He was brought back before CT scan was obtained.  Emergency release blood was in process.  IV Protonix  and octreotide  given. Patient did have initial coffee-ground emesis and then became bright red.  He was given emergency release blood products as well as IV Protonix  and octreotide .  Discussed Dr.  Veronda Goody as patient has had varices noted on previous EGD.  He will evaluate for endoscopy tonight.  Hemoglobin 9.2.  1 unit of emergency release blood was ordered prior to this.  He is given Protonix  and octreotide . Remainder of labs with AKI, lactic acidosis, hypocalcemia, CT scan was canceled by critical care given patient's ongoing hematemesis.  He complains of back pain and abdominal pain currently.  May need to reconsider CT scan once he is more stable.  Additional unit of blood ordered given his ongoing hematemesis and hypotension in the 90s and Levophed begun. Blood pressure improving on Levophed.  Initial blood products given by critical care  Remains too unstable for CT at this time.  Admission to ICU discussed with Dr. Talbot Factor    Final diagnoses:  Syncope and collapse  Upper GI bleed   ED Discharge Orders     None          Hammond Obeirne, Mara [place], MD 12/24/2023 [phone]

## 2024-01-11 NOTE — Death Summary Note (Signed)
 DEATH SUMMARY   Patient Details  Name: [name] MRN: [id number] DOB: 1934-03-03  Admission/Discharge Information   Admit Date:  12-Jan-2024  Date of Death: Date of Death: 01/13/2024  Time of Death: Time of Death: 0943  Length of Stay: 0  Referring Physician: Kaylee Partridge, MD   Reason(s) for Hospitalization  Acute GI bleeding likely upper with a differential esophageal varices versus peptic ulcer disease with background history of liver cirrhosis and ibuprofen intake for back pain Hemorrhagic shock Acute blood loss anemia Liver cirrhosis, unclear etiology Portal hypertension CKD stage IIIb Acute metabolic acidosis Acute metabolic encephalopathy in the setting of acidosis Thrombocytopenia, likely due to liver cirrhosis Obesity DNR status  Diagnoses  Preliminary cause of death: Withdrawal of care in the setting of multisystem organ failure Secondary Diagnoses (including complications and co-morbidities):  Principal Problem:   GI bleed Active Problems:   Shock [facility])   Brief Hospital Course (including significant findings, care, treatment, and services provided and events leading to death)  WELTON BORD is a 89 y.o. year old male who 89 y/o man with pmh of GI bleed 2/2 esophageal varices dementia, pAF not on AC, CAD s/p PCI, PAD s/p CEA coming in with upper gi bleed. Patient was in usoh till this evening when he had a small amount of hematemesis. This was followed by loss of consciousness necessitating EMS call.  EMS bagged him and brought him to the ed. In the ed, he as hypotensive to the 70s. He was  was found to have hemoglobin slightly lower than baseline. He was being treated for presumed sepsis when he had multiple bout of emesis, initially coffee ground, then bright red. He was given a unit of prbc with improvement in Blood pressure. CTA was attempted but he had contineud emesis for which the trip was postponed. GI was consulted for urgent EGD.   Of note, he had  presented in April 2025 with hematemesis and melena and had undergone EGD which showed large varices and red wale sign. He had 3 bands placed. Gastric varices couldn't be ruled out.  He was also newly diagnosed with cirrhosis during that admission.    He continued to have persistent hypotension for which second unit of blood was given. He was started on low dose levophed.    Of note, his wife reports he has been having some back pain and she has been sparingly using ibuprofen and tylenol  . She also reports he is DNR but ok for short term intubation.   Patient was admitted to ICU, he became progressively hypotensive requiring Levophed, vasopressin and was started on epinephrine.  He received multiple PRBCs, FFP and platelet.  He was continued on PPI and octreotide .  Patient was seen by GI initially he was hypotensive, they recommended stabilization, with worsening acidosis, recommendation was to intubate the patient and continue with medical management, considering patient has multisystem organ failure and he was against going on mechanical ventilator, patient's family decided to proceed with comfort care and palliative approach.  Comfort care orders were written, patient passed on 01/13/24 at 9:43 AM.  Patient's wife was at bedside  Pertinent Labs and Studies  Significant Diagnostic Studies DG Chest Port 1 View Result Date: 01/13/24 CLINICAL DATA:  Sepsis EXAM: PORTABLE CHEST 1 VIEW COMPARISON:  07/19/2023 FINDINGS: Minimal left basilar atelectasis. Lungs are otherwise clear. No pneumothorax or pleural effusion. Stable cardiomegaly. Left subclavian dual lead pacemaker unchanged. Pulmonary vascularity is normal. No acute bone abnormality. IMPRESSION: 1. Minimal left  basilar atelectasis. 2. Stable cardiomegaly. Electronically Signed   By: Worthy Heads M.D.   On: 12/24/2023 00:37    Microbiology Recent Results (from the past 240 hours)  Resp panel by RT-PCR (RSV, Flu A&B, Covid) Anterior Nasal  Swab     Status: None   Collection Time: 12/26/2023 12:49 AM   Specimen: Anterior Nasal Swab  Result Value Ref Range Status   SARS Coronavirus 2 by RT PCR NEGATIVE NEGATIVE Final   Influenza A by PCR NEGATIVE NEGATIVE Final   Influenza B by PCR NEGATIVE NEGATIVE Final    Comment: (NOTE) The Xpert Xpress SARS-CoV-2/FLU/RSV plus assay is intended as an aid in the diagnosis of influenza from Nasopharyngeal swab specimens and should not be used as a sole basis for treatment. Nasal washings and aspirates are unacceptable for Xpert Xpress SARS-CoV-2/FLU/RSV testing.  Fact Sheet for Patients: BloggerCourse.com  Fact Sheet for Healthcare Providers: SeriousBroker.it  This test is not yet approved or cleared by the [place]  FDA and has been authorized for detection and/or diagnosis of SARS-CoV-2 by FDA under an Emergency Use Authorization (EUA). This EUA will remain in effect (meaning this test can be used) for the duration of the COVID-19 declaration under Section 564(b)(1) of the Act, 21 U.S.C. section 360bbb-3(b)(1), unless the authorization is terminated or revoked.     Resp Syncytial Virus by PCR NEGATIVE NEGATIVE Final    Comment: (NOTE) Fact Sheet for Patients: BloggerCourse.com  Fact Sheet for Healthcare Providers: SeriousBroker.it  This test is not yet approved or cleared by the [place]  FDA and has been authorized for detection and/or diagnosis of SARS-CoV-2 by FDA under an Emergency Use Authorization (EUA). This EUA will remain in effect (meaning this test can be used) for the duration of the COVID-19 declaration under Section 564(b)(1) of the Act, 21 U.S.C. section 360bbb-3(b)(1), unless the authorization is terminated or revoked.  Performed at [facility], [address] [place], [place] [address]   MRSA Next Gen by PCR, Nasal     Status: None    Collection Time: 12/15/2023  2:46 AM   Specimen: Nasal Mucosa; Nasal Swab  Result Value Ref Range Status   MRSA by PCR Next Gen NOT DETECTED NOT DETECTED Final    Comment: (NOTE) The GeneXpert MRSA Assay (FDA approved for NASAL specimens only), is one component of a comprehensive MRSA colonization surveillance program. It is not intended to diagnose MRSA infection nor to guide or monitor treatment for MRSA infections. Test performance is not FDA approved in patients less than 13 years old. Performed at [facility], [address], [place], [place] [address]     Lab Basic Metabolic Panel: Recent Labs  Lab 12/30/23 2357 12/26/2023 0023 12/27/2023 0024 01/05/2024 0731 12/26/2023 0825  NA 140 143 142 141 138  K 3.6 3.6 3.6 5.8* 5.8*  CL 110  --  105 109  --   CO2 20*  --   --  9*  --   GLUCOSE 152*  --  146* 240*  --   BUN 24*  --  25* 26*  --   CREATININE 1.92*  --  1.90* 1.97*  --   CALCIUM  8.3*  --   --  7.3*  --    Liver Function Tests: Recent Labs  Lab 12/30/23 2357  AST 24  ALT 14  ALKPHOS 58  BILITOT 1.5*  PROT 5.3*  ALBUMIN 2.6*   Recent Labs  Lab 12/30/23 2357  LIPASE 22  Recent Labs  Lab 12/27/2023 0033  AMMONIA 24   CBC: Recent Labs  Lab 12/30/23 2357 12/15/2023 0023 12/13/2023 0024 12/25/2023 0520 12/19/2023 0733 12/15/2023 0825  WBC 9.4  --   --  14.3* 23.2*  --   NEUTROABS 5.4  --   --   --   --   --   HGB 8.9* 8.5* 9.2* 8.3* 8.8* 8.2*  HCT 27.6* 25.0* 27.0* 25.9* 27.3* 24.0*  MCV 102.6*  --   --  98.9 97.8  --   PLT 133*  --   --  113* 73*  --    Cardiac Enzymes: No results for input(s): CKTOTAL, CKMB, CKMBINDEX, TROPONINI in the last 168 hours. Sepsis Labs: Recent Labs  Lab 12/30/23 2357 01/08/2024 0024 12/25/2023 0520 01/03/2024 0733  WBC 9.4  --  14.3* 23.2*  LATICACIDVEN  --  3.4* 7.1*  --     Procedures/Operations     Xitlali Kastens 01/07/2024, 12:56 PM

## 2024-01-11 NOTE — Progress Notes (Addendum)
 eLink Physician-Brief Progress Note Patient Name: [name] DOB: 1933/11/10 MRN: [id number]   Date of Service  01/09/2024  HPI/Events of Note  89 year old male with history of dementia, atrial fibrillation, coronary artery disease and PAD who presents with upper GI bleed and shock likely secondary to known esophageal varices with previous banding in April 2025.  DNR but okay for short-term intubation  Patient is tachypneic and hypotensive saturating 92% on room air.  Currently on norepinephrine and octreotide  infusions.  Results show metabolic acidosis, elevated creatinine, lactic acidosis, macrocytic anemia and thrombocytopenia.  3 units PRBC transfusing.  eICU Interventions  Maintain PPI IV, octreotide  infusion, and GI consultation  Given concern for hemorrhagic shock, and hemoglobin check every 6 hours.  Coagulation studies pending.  Transfuse for hemoglobin less than 7.  Arterial line in the setting of frequent lab draws and hypotension.  Has for peripheral IVs, but if he does not respond to the third unit of blood, may need central access for higher level of pressors.  DVT prophylaxis with SCDs GI prophylaxis with therapeutic pantoprazole    0557 -remains hypotensive despite escalating vasopressor therapy-now on 20 mcg of norepinephrine.  Is running through peripheral IV, GI evaluation still pending.  Will extend the ceiling of norepinephrine further.  Add vasopressin.  One-time push of bicarbonate.  Last hemoglobin was 9.2 at midnight.  Unable to get coagulation studies or remaining morning labs due to difficult access.  Unable to obtain arterial line thus far.  Will empirically order 1:1:1 transfusion.  Additional calcium  as well.  Refer to ground team for potential central line and evaluation imminent decline in mental status  Intervention Category Evaluation Type: New Patient Evaluation  [name] 12/27/2023, 4:12 AM

## 2024-01-11 DEATH — deceased
